# Patient Record
Sex: Female | Born: 1940
Health system: Southern US, Community
[De-identification: ages and names within clinical notes are randomized; demographics above are authoritative.]

## PROBLEM LIST (undated history)

## (undated) DIAGNOSIS — E785 Hyperlipidemia, unspecified: Secondary | ICD-10-CM

## (undated) DIAGNOSIS — C189 Malignant neoplasm of colon, unspecified: Secondary | ICD-10-CM

## (undated) DIAGNOSIS — I639 Cerebral infarction, unspecified: Secondary | ICD-10-CM

## (undated) DIAGNOSIS — Z9889 Other specified postprocedural states: Secondary | ICD-10-CM

## (undated) DIAGNOSIS — I10 Essential (primary) hypertension: Secondary | ICD-10-CM

## (undated) DIAGNOSIS — M1990 Unspecified osteoarthritis, unspecified site: Secondary | ICD-10-CM

## (undated) DIAGNOSIS — R112 Nausea with vomiting, unspecified: Secondary | ICD-10-CM

## (undated) DIAGNOSIS — H269 Unspecified cataract: Secondary | ICD-10-CM

## (undated) DIAGNOSIS — K589 Irritable bowel syndrome without diarrhea: Secondary | ICD-10-CM

## (undated) DIAGNOSIS — K219 Gastro-esophageal reflux disease without esophagitis: Secondary | ICD-10-CM

## (undated) DIAGNOSIS — I2510 Atherosclerotic heart disease of native coronary artery without angina pectoris: Secondary | ICD-10-CM

## (undated) DIAGNOSIS — N3010 Interstitial cystitis (chronic) without hematuria: Secondary | ICD-10-CM

## (undated) DIAGNOSIS — R519 Headache, unspecified: Secondary | ICD-10-CM

## (undated) DIAGNOSIS — I499 Cardiac arrhythmia, unspecified: Secondary | ICD-10-CM

## (undated) DIAGNOSIS — D649 Anemia, unspecified: Secondary | ICD-10-CM

## (undated) DIAGNOSIS — R51 Headache: Secondary | ICD-10-CM

## (undated) DIAGNOSIS — H409 Unspecified glaucoma: Secondary | ICD-10-CM

## (undated) DIAGNOSIS — M2042 Other hammer toe(s) (acquired), left foot: Secondary | ICD-10-CM

## (undated) DIAGNOSIS — R0600 Dyspnea, unspecified: Secondary | ICD-10-CM

## (undated) DIAGNOSIS — H52209 Unspecified astigmatism, unspecified eye: Secondary | ICD-10-CM

## (undated) DIAGNOSIS — B009 Herpesviral infection, unspecified: Secondary | ICD-10-CM

## (undated) DIAGNOSIS — C801 Malignant (primary) neoplasm, unspecified: Secondary | ICD-10-CM

## (undated) DIAGNOSIS — M2041 Other hammer toe(s) (acquired), right foot: Secondary | ICD-10-CM

## (undated) DIAGNOSIS — G4700 Insomnia, unspecified: Secondary | ICD-10-CM

## (undated) DIAGNOSIS — M7730 Calcaneal spur, unspecified foot: Secondary | ICD-10-CM

## (undated) DIAGNOSIS — R739 Hyperglycemia, unspecified: Secondary | ICD-10-CM

## (undated) HISTORY — DX: Cerebral infarction, unspecified: I63.9

## (undated) HISTORY — DX: Hyperlipidemia, unspecified: E78.5

## (undated) HISTORY — DX: Malignant neoplasm of colon, unspecified: C18.9

## (undated) HISTORY — DX: Herpesviral infection, unspecified: B00.9

## (undated) HISTORY — DX: Unspecified cataract: H26.9

## (undated) HISTORY — PX: CHOLECYSTECTOMY: SHX55

## (undated) HISTORY — DX: Hyperglycemia, unspecified: R73.9

## (undated) HISTORY — DX: Calcaneal spur, unspecified foot: M77.30

## (undated) HISTORY — DX: Gastro-esophageal reflux disease without esophagitis: K21.9

## (undated) HISTORY — PX: ROTATOR CUFF REPAIR: SHX139

## (undated) HISTORY — PX: ABDOMINAL HYSTERECTOMY: SHX81

## (undated) HISTORY — DX: Insomnia, unspecified: G47.00

## (undated) HISTORY — DX: Atherosclerotic heart disease of native coronary artery without angina pectoris: I25.10

## (undated) HISTORY — DX: Irritable bowel syndrome, unspecified: K58.9

## (undated) HISTORY — PX: EYE SURGERY: SHX253

## (undated) HISTORY — DX: Interstitial cystitis (chronic) without hematuria: N30.10

---

## 1997-05-28 ENCOUNTER — Encounter: Admission: RE | Admit: 1997-05-28 | Discharge: 1997-05-28 | Payer: Self-pay | Admitting: Family Medicine

## 1997-08-30 ENCOUNTER — Ambulatory Visit (HOSPITAL_COMMUNITY): Admission: RE | Admit: 1997-08-30 | Discharge: 1997-08-30 | Payer: Self-pay | Admitting: Urology

## 1998-11-05 ENCOUNTER — Ambulatory Visit (HOSPITAL_COMMUNITY): Admission: RE | Admit: 1998-11-05 | Discharge: 1998-11-05 | Payer: Self-pay | Admitting: Family Medicine

## 1999-04-15 ENCOUNTER — Encounter: Payer: Self-pay | Admitting: Orthopedic Surgery

## 1999-04-15 ENCOUNTER — Encounter: Admission: RE | Admit: 1999-04-15 | Discharge: 1999-04-15 | Payer: Self-pay | Admitting: Orthopedic Surgery

## 1999-04-27 ENCOUNTER — Observation Stay (HOSPITAL_COMMUNITY): Admission: RE | Admit: 1999-04-27 | Discharge: 1999-04-29 | Payer: Self-pay | Admitting: Orthopedic Surgery

## 2000-03-08 ENCOUNTER — Ambulatory Visit (HOSPITAL_BASED_OUTPATIENT_CLINIC_OR_DEPARTMENT_OTHER): Admission: RE | Admit: 2000-03-08 | Discharge: 2000-03-09 | Payer: Self-pay | Admitting: Orthopedic Surgery

## 2000-05-23 ENCOUNTER — Encounter: Payer: Self-pay | Admitting: Family Medicine

## 2000-05-23 LAB — CONVERTED CEMR LAB

## 2000-09-01 ENCOUNTER — Encounter: Admission: RE | Admit: 2000-09-01 | Discharge: 2000-09-01 | Payer: Self-pay | Admitting: Family Medicine

## 2000-09-01 ENCOUNTER — Encounter: Payer: Self-pay | Admitting: Family Medicine

## 2000-10-05 ENCOUNTER — Encounter: Admission: RE | Admit: 2000-10-05 | Discharge: 2000-10-05 | Payer: Self-pay | Admitting: Gastroenterology

## 2000-10-05 ENCOUNTER — Encounter: Payer: Self-pay | Admitting: Gastroenterology

## 2000-11-10 ENCOUNTER — Encounter: Payer: Self-pay | Admitting: Surgery

## 2000-11-14 ENCOUNTER — Observation Stay (HOSPITAL_COMMUNITY): Admission: RE | Admit: 2000-11-14 | Discharge: 2000-11-15 | Payer: Self-pay | Admitting: Surgery

## 2000-11-14 ENCOUNTER — Encounter (INDEPENDENT_AMBULATORY_CARE_PROVIDER_SITE_OTHER): Payer: Self-pay | Admitting: *Deleted

## 2004-09-01 ENCOUNTER — Ambulatory Visit: Payer: Self-pay | Admitting: Family Medicine

## 2005-01-01 ENCOUNTER — Ambulatory Visit: Payer: Self-pay | Admitting: Family Medicine

## 2005-03-16 ENCOUNTER — Ambulatory Visit: Payer: Self-pay | Admitting: Family Medicine

## 2005-06-14 ENCOUNTER — Ambulatory Visit: Payer: Self-pay | Admitting: Family Medicine

## 2006-03-30 ENCOUNTER — Emergency Department (HOSPITAL_COMMUNITY): Admission: EM | Admit: 2006-03-30 | Discharge: 2006-03-30 | Payer: Self-pay | Admitting: Emergency Medicine

## 2007-03-17 ENCOUNTER — Encounter: Payer: Self-pay | Admitting: Family Medicine

## 2007-03-17 DIAGNOSIS — N3010 Interstitial cystitis (chronic) without hematuria: Secondary | ICD-10-CM | POA: Insufficient documentation

## 2007-03-17 DIAGNOSIS — K589 Irritable bowel syndrome without diarrhea: Secondary | ICD-10-CM | POA: Insufficient documentation

## 2007-03-17 DIAGNOSIS — M810 Age-related osteoporosis without current pathological fracture: Secondary | ICD-10-CM | POA: Insufficient documentation

## 2007-06-27 ENCOUNTER — Telehealth: Payer: Self-pay | Admitting: Family Medicine

## 2007-06-28 ENCOUNTER — Telehealth: Payer: Self-pay | Admitting: Family Medicine

## 2007-06-28 ENCOUNTER — Ambulatory Visit: Payer: Self-pay | Admitting: Family Medicine

## 2007-06-28 DIAGNOSIS — D126 Benign neoplasm of colon, unspecified: Secondary | ICD-10-CM | POA: Insufficient documentation

## 2007-06-28 LAB — CONVERTED CEMR LAB
Bilirubin Urine: NEGATIVE
Casts: 0 /lpf
Epithelial cells, urine: 1 /lpf
Glucose, Urine, Semiquant: NEGATIVE
Ketones, urine, test strip: NEGATIVE
Mucus, UA: 0
Nitrite: NEGATIVE
Specific Gravity, Urine: 1.025
Urine crystals, microscopic: 0 /hpf
Urobilinogen, UA: 0.2
WBC Urine, dipstick: NEGATIVE
WBC, UA: 1 cells/hpf
Yeast, UA: 0
pH: 5

## 2007-06-29 ENCOUNTER — Encounter: Payer: Self-pay | Admitting: Family Medicine

## 2007-07-04 ENCOUNTER — Ambulatory Visit: Payer: Self-pay | Admitting: Professional

## 2007-07-14 ENCOUNTER — Ambulatory Visit: Payer: Self-pay | Admitting: Family Medicine

## 2007-08-30 ENCOUNTER — Ambulatory Visit: Payer: Self-pay | Admitting: Family Medicine

## 2007-08-30 LAB — CONVERTED CEMR LAB
Bacteria, UA: 0
Bilirubin Urine: NEGATIVE
Blood in Urine, dipstick: NEGATIVE
Casts: 0 /lpf
Glucose, Urine, Semiquant: NEGATIVE
Ketones, urine, test strip: NEGATIVE
Nitrite: NEGATIVE
Protein, U semiquant: NEGATIVE
RBC / HPF: 0
Specific Gravity, Urine: 1.02
Urine crystals, microscopic: 0 /hpf
Urobilinogen, UA: 0.2
WBC Urine, dipstick: NEGATIVE
WBC, UA: 0 cells/hpf
Yeast, UA: 0
pH: 6

## 2007-08-31 LAB — CONVERTED CEMR LAB
ALT: 22 units/L (ref 0–35)
AST: 22 units/L (ref 0–37)
Albumin: 3.8 g/dL (ref 3.5–5.2)
Alkaline Phosphatase: 69 units/L (ref 39–117)
BUN: 19 mg/dL (ref 6–23)
Basophils Absolute: 0 10*3/uL (ref 0.0–0.1)
Basophils Relative: 0.5 % (ref 0.0–1.0)
Bilirubin, Direct: 0.1 mg/dL (ref 0.0–0.3)
CO2: 29 meq/L (ref 19–32)
Calcium: 9.2 mg/dL (ref 8.4–10.5)
Chloride: 105 meq/L (ref 96–112)
Cholesterol: 213 mg/dL (ref 0–200)
Creatinine, Ser: 1 mg/dL (ref 0.4–1.2)
Direct LDL: 129.3 mg/dL
Eosinophils Absolute: 0.1 10*3/uL (ref 0.0–0.7)
Eosinophils Relative: 1 % (ref 0.0–5.0)
GFR calc Af Amer: 71 mL/min
GFR calc non Af Amer: 58 mL/min
Glucose, Bld: 112 mg/dL — ABNORMAL HIGH (ref 70–99)
HCT: 37.5 % (ref 36.0–46.0)
HDL: 64.3 mg/dL (ref >39.0)
Hemoglobin: 12.9 g/dL (ref 12.0–15.0)
Lymphocytes Relative: 29.4 % (ref 12.0–46.0)
MCHC: 34.3 g/dL (ref 30.0–36.0)
MCV: 90.6 fL (ref 78.0–100.0)
Monocytes Absolute: 0.5 10*3/uL (ref 0.1–1.0)
Monocytes Relative: 8.1 % (ref 3.0–12.0)
Neutro Abs: 4.1 10*3/uL (ref 1.4–7.7)
Neutrophils Relative %: 61 % (ref 43.0–77.0)
Platelets: 260 10*3/uL (ref 150–400)
Potassium: 4 meq/L (ref 3.5–5.1)
RBC: 4.14 M/uL (ref 3.87–5.11)
RDW: 13.1 % (ref 11.5–14.6)
Sodium: 140 meq/L (ref 135–145)
TSH: 1.96 microintl units/mL (ref 0.35–5.50)
Total Bilirubin: 0.7 mg/dL (ref 0.3–1.2)
Total CHOL/HDL Ratio: 3.3
Total Protein: 6.9 g/dL (ref 6.0–8.3)
Triglycerides: 76 mg/dL (ref 0–149)
VLDL: 15 mg/dL (ref 0–40)
WBC: 6.6 10*3/uL (ref 4.5–10.5)

## 2007-09-01 LAB — CONVERTED CEMR LAB: Vit D, 1,25-Dihydroxy: 16 — ABNORMAL LOW (ref 30–89)

## 2007-11-13 ENCOUNTER — Ambulatory Visit: Payer: Self-pay | Admitting: Family Medicine

## 2007-11-15 LAB — CONVERTED CEMR LAB
ALT: 17 units/L (ref 0–35)
AST: 20 units/L (ref 0–37)
Cholesterol: 184 mg/dL (ref 0–200)
HDL: 67.7 mg/dL (ref >39.0)
Hgb A1c MFr Bld: 5.9 % (ref 4.6–6.0)
LDL Cholesterol: 104 mg/dL — ABNORMAL HIGH (ref 0–99)
Total CHOL/HDL Ratio: 2.7
Triglycerides: 61 mg/dL (ref 0–149)
VLDL: 12 mg/dL (ref 0–40)
Vit D, 1,25-Dihydroxy: 26 — ABNORMAL LOW (ref 30–89)

## 2008-03-18 ENCOUNTER — Telehealth: Payer: Self-pay | Admitting: Family Medicine

## 2008-03-18 ENCOUNTER — Ambulatory Visit: Payer: Self-pay | Admitting: Family Medicine

## 2008-03-18 LAB — CONVERTED CEMR LAB
Bilirubin Urine: NEGATIVE
Casts: 0 /lpf
Glucose, Urine, Semiquant: NEGATIVE
Ketones, urine, test strip: NEGATIVE
Nitrite: NEGATIVE
Protein, U semiquant: NEGATIVE
Specific Gravity, Urine: 1.02
Urine crystals, microscopic: 0 /hpf
Urobilinogen, UA: 0.2
Yeast, UA: 1
pH: 6

## 2008-03-19 ENCOUNTER — Ambulatory Visit: Payer: Self-pay | Admitting: Family Medicine

## 2008-03-20 ENCOUNTER — Encounter: Payer: Self-pay | Admitting: Family Medicine

## 2008-03-27 ENCOUNTER — Telehealth: Payer: Self-pay | Admitting: Family Medicine

## 2008-03-28 ENCOUNTER — Ambulatory Visit: Payer: Self-pay | Admitting: Family Medicine

## 2008-03-28 LAB — CONVERTED CEMR LAB
Casts: 0 /lpf
RBC / HPF: 0
WBC, UA: 1 cells/hpf
Yeast, UA: 0

## 2008-03-29 ENCOUNTER — Encounter: Payer: Self-pay | Admitting: Family Medicine

## 2008-04-30 ENCOUNTER — Ambulatory Visit: Payer: Self-pay | Admitting: Family Medicine

## 2008-04-30 DIAGNOSIS — E559 Vitamin D deficiency, unspecified: Secondary | ICD-10-CM | POA: Insufficient documentation

## 2008-05-03 LAB — CONVERTED CEMR LAB
Albumin: 3.6 g/dL (ref 3.5–5.2)
BUN: 15 mg/dL (ref 6–23)
Basophils Absolute: 0 10*3/uL (ref 0.0–0.1)
Basophils Relative: 0.7 % (ref 0.0–3.0)
CO2: 29 meq/L (ref 19–32)
Calcium: 9.1 mg/dL (ref 8.4–10.5)
Chloride: 105 meq/L (ref 96–112)
Creatinine, Ser: 0.8 mg/dL (ref 0.4–1.2)
Eosinophils Absolute: 0.1 10*3/uL (ref 0.0–0.7)
Eosinophils Relative: 1.4 % (ref 0.0–5.0)
GFR calc Af Amer: 91 mL/min
GFR calc non Af Amer: 76 mL/min
Glucose, Bld: 79 mg/dL (ref 70–99)
HCT: 36.9 % (ref 36.0–46.0)
Hemoglobin: 12.5 g/dL (ref 12.0–15.0)
Lymphocytes Relative: 32.2 % (ref 12.0–46.0)
MCHC: 33.9 g/dL (ref 30.0–36.0)
MCV: 90.8 fL (ref 78.0–100.0)
Monocytes Absolute: 0.6 10*3/uL (ref 0.1–1.0)
Monocytes Relative: 10.6 % (ref 3.0–12.0)
Neutro Abs: 3.2 10*3/uL (ref 1.4–7.7)
Neutrophils Relative %: 55.1 % (ref 43.0–77.0)
Phosphorus: 3 mg/dL (ref 2.3–4.6)
Platelets: 268 10*3/uL (ref 150–400)
Potassium: 3.8 meq/L (ref 3.5–5.1)
RBC: 4.07 M/uL (ref 3.87–5.11)
RDW: 12.6 % (ref 11.5–14.6)
Sodium: 140 meq/L (ref 135–145)
TSH: 1.69 microintl units/mL (ref 0.35–5.50)
Vit D, 25-Hydroxy: 23 ng/mL — ABNORMAL LOW (ref 30–89)
WBC: 5.7 10*3/uL (ref 4.5–10.5)

## 2008-05-10 ENCOUNTER — Encounter: Admission: RE | Admit: 2008-05-10 | Discharge: 2008-05-10 | Payer: Self-pay | Admitting: Family Medicine

## 2008-05-15 ENCOUNTER — Encounter (INDEPENDENT_AMBULATORY_CARE_PROVIDER_SITE_OTHER): Payer: Self-pay | Admitting: *Deleted

## 2008-07-12 ENCOUNTER — Ambulatory Visit: Payer: Self-pay | Admitting: Family Medicine

## 2008-07-16 ENCOUNTER — Ambulatory Visit: Payer: Self-pay | Admitting: Family Medicine

## 2008-07-16 LAB — CONVERTED CEMR LAB
Casts: 0 /lpf
Glucose, Urine, Semiquant: NEGATIVE
Urine crystals, microscopic: 0 /hpf
Vit D, 25-Hydroxy: 21 ng/mL — ABNORMAL LOW (ref 30–89)
Yeast, UA: 0

## 2008-07-17 ENCOUNTER — Encounter: Payer: Self-pay | Admitting: Family Medicine

## 2008-07-19 ENCOUNTER — Telehealth: Payer: Self-pay | Admitting: Family Medicine

## 2008-09-13 ENCOUNTER — Ambulatory Visit: Payer: Self-pay | Admitting: Family Medicine

## 2008-09-13 LAB — CONVERTED CEMR LAB
Bacteria, UA: 0
Bilirubin Urine: NEGATIVE
Blood in Urine, dipstick: NEGATIVE
Casts: 0 /lpf
Glucose, Urine, Semiquant: NEGATIVE
Ketones, urine, test strip: NEGATIVE
Nitrite: NEGATIVE
Protein, U semiquant: NEGATIVE
RBC / HPF: 0
Specific Gravity, Urine: 1.01
Urine crystals, microscopic: 0 /hpf
Urobilinogen, UA: 0.2
WBC Urine, dipstick: NEGATIVE
WBC, UA: 0 cells/hpf
Yeast, UA: 0
pH: 7

## 2008-09-16 ENCOUNTER — Telehealth: Payer: Self-pay | Admitting: Family Medicine

## 2008-10-08 ENCOUNTER — Ambulatory Visit: Payer: Self-pay | Admitting: Family Medicine

## 2008-10-14 LAB — CONVERTED CEMR LAB: Vit D, 25-Hydroxy: 22 ng/mL — ABNORMAL LOW (ref 30–89)

## 2008-12-04 ENCOUNTER — Ambulatory Visit: Payer: Self-pay | Admitting: Family Medicine

## 2008-12-04 DIAGNOSIS — J309 Allergic rhinitis, unspecified: Secondary | ICD-10-CM | POA: Insufficient documentation

## 2008-12-04 LAB — CONVERTED CEMR LAB
Bacteria, UA: 0
Bilirubin Urine: NEGATIVE
Epithelial cells, urine: 0 /lpf
Glucose, Urine, Semiquant: NEGATIVE
Ketones, urine, test strip: NEGATIVE
Nitrite: NEGATIVE
Specific Gravity, Urine: <1.005
Urobilinogen, UA: 0.2
WBC Urine, dipstick: NEGATIVE
WBC, UA: 0 cells/hpf
pH: 6

## 2008-12-06 ENCOUNTER — Telehealth (INDEPENDENT_AMBULATORY_CARE_PROVIDER_SITE_OTHER): Payer: Self-pay | Admitting: Internal Medicine

## 2009-06-11 ENCOUNTER — Ambulatory Visit: Payer: Self-pay | Admitting: Family Medicine

## 2009-07-29 ENCOUNTER — Telehealth (INDEPENDENT_AMBULATORY_CARE_PROVIDER_SITE_OTHER): Payer: Self-pay | Admitting: *Deleted

## 2009-07-29 ENCOUNTER — Ambulatory Visit: Payer: Self-pay | Admitting: Family Medicine

## 2009-07-29 DIAGNOSIS — R7309 Other abnormal glucose: Secondary | ICD-10-CM

## 2009-07-29 DIAGNOSIS — R7303 Prediabetes: Secondary | ICD-10-CM | POA: Insufficient documentation

## 2009-07-29 LAB — CONVERTED CEMR LAB
ALT: 17 units/L (ref 0–35)
AST: 19 units/L (ref 0–37)
Albumin: 4.2 g/dL (ref 3.5–5.2)
BUN: 14 mg/dL (ref 6–23)
CO2: 30 meq/L (ref 19–32)
Calcium: 9.8 mg/dL (ref 8.4–10.5)
Chloride: 106 meq/L (ref 96–112)
Cholesterol: 247 mg/dL — ABNORMAL HIGH (ref 0–200)
Creatinine, Ser: 0.9 mg/dL (ref 0.4–1.2)
Direct LDL: 144.5 mg/dL
GFR calc non Af Amer: 70.07 mL/min (ref >60)
Glucose, Bld: 88 mg/dL (ref 70–99)
HDL: 85.6 mg/dL (ref >39.00)
Hgb A1c MFr Bld: 6 % (ref 4.6–6.5)
Phosphorus: 3.9 mg/dL (ref 2.3–4.6)
Potassium: 4.4 meq/L (ref 3.5–5.1)
Sodium: 143 meq/L (ref 135–145)
TSH: 1.65 microintl units/mL (ref 0.35–5.50)
Total CHOL/HDL Ratio: 3
Triglycerides: 69 mg/dL (ref 0.0–149.0)
VLDL: 13.8 mg/dL (ref 0.0–40.0)

## 2009-07-30 LAB — CONVERTED CEMR LAB: Vit D, 25-Hydroxy: 28 ng/mL — ABNORMAL LOW (ref 30–89)

## 2009-08-01 ENCOUNTER — Ambulatory Visit: Payer: Self-pay | Admitting: Family Medicine

## 2009-08-01 LAB — CONVERTED CEMR LAB
Bacteria, UA: 0
Bilirubin Urine: NEGATIVE
Casts: 0 /lpf
Epithelial cells, urine: 1 /lpf
Glucose, Urine, Semiquant: NEGATIVE
Ketones, urine, test strip: NEGATIVE
Nitrite: NEGATIVE
RBC / HPF: 0
Specific Gravity, Urine: 1.015
Urine crystals, microscopic: 0 /hpf
Urobilinogen, UA: 0.2
WBC Urine, dipstick: NEGATIVE
WBC, UA: 0 cells/hpf
Yeast, UA: 0
pH: 7

## 2009-08-15 ENCOUNTER — Encounter: Admission: RE | Admit: 2009-08-15 | Discharge: 2009-08-15 | Payer: Self-pay | Admitting: Family Medicine

## 2009-08-15 LAB — HM MAMMOGRAPHY: HM Mammogram: NEGATIVE

## 2009-08-18 ENCOUNTER — Encounter (INDEPENDENT_AMBULATORY_CARE_PROVIDER_SITE_OTHER): Payer: Self-pay | Admitting: *Deleted

## 2009-09-24 ENCOUNTER — Encounter: Payer: Self-pay | Admitting: Family Medicine

## 2009-09-24 LAB — HM COLONOSCOPY: HM Colonoscopy: NORMAL

## 2010-01-09 ENCOUNTER — Ambulatory Visit: Payer: Self-pay | Admitting: Family Medicine

## 2010-01-09 DIAGNOSIS — R252 Cramp and spasm: Secondary | ICD-10-CM | POA: Insufficient documentation

## 2010-01-09 DIAGNOSIS — M79609 Pain in unspecified limb: Secondary | ICD-10-CM | POA: Insufficient documentation

## 2010-01-13 ENCOUNTER — Telehealth: Payer: Self-pay | Admitting: Family Medicine

## 2010-01-13 LAB — CONVERTED CEMR LAB
BUN: 16 mg/dL (ref 6–23)
CO2: 29 meq/L (ref 19–32)
Calcium: 9.2 mg/dL (ref 8.4–10.5)
Chloride: 104 meq/L (ref 96–112)
Creatinine, Ser: 0.8 mg/dL (ref 0.4–1.2)
GFR calc non Af Amer: 72.94 mL/min (ref >60)
Glucose, Bld: 92 mg/dL (ref 70–99)
Hgb A1c MFr Bld: 6.1 % (ref 4.6–6.5)
Potassium: 4.3 meq/L (ref 3.5–5.1)
Sodium: 140 meq/L (ref 135–145)
TSH: 2.04 microintl units/mL (ref 0.35–5.50)

## 2010-01-28 ENCOUNTER — Ambulatory Visit: Payer: Self-pay | Admitting: Family Medicine

## 2010-01-29 ENCOUNTER — Encounter (INDEPENDENT_AMBULATORY_CARE_PROVIDER_SITE_OTHER): Payer: Self-pay | Admitting: *Deleted

## 2010-02-02 ENCOUNTER — Encounter: Payer: Self-pay | Admitting: Family Medicine

## 2010-02-02 ENCOUNTER — Ambulatory Visit: Payer: Self-pay | Admitting: Family Medicine

## 2010-02-02 DIAGNOSIS — G4700 Insomnia, unspecified: Secondary | ICD-10-CM | POA: Insufficient documentation

## 2010-02-02 LAB — CONVERTED CEMR LAB
Cholesterol, target level: 200 mg/dL
HDL goal, serum: 40 mg/dL
LDL Goal: 160 mg/dL

## 2010-02-06 LAB — CONVERTED CEMR LAB: Vit D, 25-Hydroxy: 24 ng/mL — ABNORMAL LOW (ref 30–89)

## 2010-02-11 LAB — CONVERTED CEMR LAB
ALT: 19 units/L (ref 0–35)
AST: 21 units/L (ref 0–37)
Cholesterol: 228 mg/dL — ABNORMAL HIGH (ref 0–200)
Direct LDL: 132.1 mg/dL
HDL: 76.2 mg/dL (ref >39.00)
Total CHOL/HDL Ratio: 3
Triglycerides: 65 mg/dL (ref 0.0–149.0)
VLDL: 13 mg/dL (ref 0.0–40.0)

## 2010-03-15 ENCOUNTER — Encounter: Payer: Self-pay | Admitting: Family Medicine

## 2010-03-26 NOTE — Assessment & Plan Note (Signed)
Summary: 2 MONTH FOLLOW UP/RBH   Vital Signs:  Patient Profile:   70 Years Old Female Weight:      153 pounds Temp:     97.6 degrees F oral Pulse rate:   60 / minute Pulse rhythm:   regular BP sitting:   120 / 60  (left arm) Cuff size:   regular  Vitals Entered By: Providence Crosby (August 30, 2007 11:32 AM)                 Chief Complaint:  2 MONTH FOLLOWUP.  History of Present Illness: is doing well overall  some leg aching both joint and muscle at night with more outdoor work-- but otherwise ok  has had OP in past- no meds, just ca and vit D good exercise- rides horses   her reflux symptoms are better-- both throat and chest symptoms are gone  on nexium daily-- occ misses a dose  whole body and even bladder feels better on nexium -- IC  pt notes-- both of her probems with reflux and swallowing   left urine sample today hard to tell when she has an infection- because urine is always frequent   wants to start working on wt loss - knows shs needs to eat less wants to loose fat around middle  wants to be checked for DM- and have general labs  no thirst or inc urination or numbness diet is overall fair at this tims-- some sweets      Prior Medications Reviewed Using: Patient Recall  Current Allergies (reviewed today): ! PENICILLIN ! SULFA ! CODEINE ! DEMEROL ! DARVON  Past Medical History:    Reviewed history from 06/28/2007 and no changes required:       Hyperlipidemia       Osteoporosis       IC       heel spurs       IBS OP       GERD         Past Surgical History:    Reviewed history from 03/17/2007 and no changes required:       Right arm fracture       Rotator cuff repair x 2       Hysterectomy- PID, 1 ovary left       Dexa- osteopenia (08/2000)       Upper GI- hiatal hernia (09/2000)       Cholecystectomy (11/2000)       Cardiolite- neg (12/2001)       Colonoscopy- polyps (04/2002)   Family History:    Reviewed history from 03/17/2007 and  no changes required:       Father: MI's, embolism, ? aneurysm, TB, CVA ETOH, HTN- died age 90, GERD       Mother: ETOH, MI's- died age 59, GERD       Siblings: 1 sister with female cancer, 1 sister- suicide, depressed and in bad marriage  Social History:    Reviewed history from 06/28/2007 and no changes required:       Marital Status: Married (husb had aneurysm- survived )       Children: 4       Occupation: retired       rides horses       non smoker           Review of Systems  General      Denies fatigue, loss of appetite, and malaise.  Eyes      Denies  blurring and eye pain.  GI      Denies abdominal pain, bloody stools, change in bowel habits, dark tarry stools, gas, loss of appetite, nausea, and vomiting.  GU      Complains of urinary frequency.      Denies dysuria, hematuria, and incontinence.  Derm      Denies itching, lesion(s), and rash.  Neuro      Denies numbness and tingling.  Psych      mood is ok- stress with husb is a bit better  Endo      Denies excessive thirst and excessive urination.   Physical Exam  General:     Well-developed,well-nourished,in no acute distress; alert,appropriate and cooperative throughout examination Head:     normocephalic, atraumatic, and no abnormalities observed.   Eyes:     vision grossly intact, pupils equal, pupils round, and pupils reactive to light.  no conjunctival pallor, injection or icterus  Mouth:     MMM, throat clear  Neck:     supple with full rom and no masses or thyromegally, no JVD or carotid bruit  Chest Wall:     No deformities, masses, or tenderness noted. Lungs:     Normal respiratory effort, chest expands symmetrically. Lungs are clear to auscultation, no crackles or wheezes. Heart:     Normal rate and regular rhythm. S1 and S2 normal without gallop, murmur, click, rub or other extra sounds. Abdomen:     Bowel sounds positive,abdomen soft and non-tender without masses, organomegaly or  hernias noted. Msk:     no CVA tenderness no acute joint changes  no kyphosis  Extremities:     No clubbing, cyanosis, edema, or deformity noted with normal full range of motion of all joints.   Neurologic:     sensation intact to light touch, gait normal, and DTRs symmetrical and normal.   Skin:     Intact without suspicious lesions or rashes slt sallow complexion baseline  Cervical Nodes:     No lymphadenopathy noted Inguinal Nodes:     No significant adenopathy Psych:     normal affect, talkative and pleasant     Impression & Recommendations:  Problem # 1:  GERD (ICD-530.81) Assessment: Improved resolution of symptoms on nexium plan to continue it with good diet and effort for wt loss will check cbc with diff with labs today Her updated medication list for this problem includes:    Nexium 40 Mg Cpdr (Esomeprazole magnesium) .Marland Kitchen... 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) Assessment: Unchanged ongoing with symptoms that come and go  some increased frequency past 2 d-- specimen left is negative for abnormalities  Orders: UA Dipstick W/ Micro (manual) (60454)  Orders: UA Dipstick W/ Micro (manual) (09811)   Problem # 3:  HYPERLIPIDEMIA (ICD-272.4) Assessment: Unchanged checking labs today -for lipids and liver fxn and bmp  diet is mostly low fat- except for ice cream disc low sat fat diet  schedule f/u in fall to disc results in more detail Orders: Venipuncture (91478) TLB-Lipid Panel (80061-LIPID) TLB-BMP (Basic Metabolic Panel-BMET) (80048-METABOL) TLB-Hepatic/Liver Function Pnl (80076-HEPATIC)   Problem # 4:  OSTEOPOROSIS (ICD-733.00) Assessment: Unchanged enc to continue ca and vit D will check tsh with labs as well as vit D level will disc further at f/u in fall  Her updated medication list for this problem includes:    Calcium 1500 Mg  Tabs (  Calcium carbonate) .Marland Kitchen... Take by mouth as directed qd  Orders: Venipuncture (16109) TLB-TSH (Thyroid Stimulating Hormone) (84443-TSH) T-Vitamin D (25-Hydroxy) (60454-09811)   Problem # 5:  SCREENING, DIABETES MELLITUS (ICD-V77.1) Assessment: Comment Only sugar incl in labs at pt request- screen for DM  disc further at f/u keep working at healthy diet-- adv to elim sweets   Complete Medication List: 1)  Nexium 40 Mg Cpdr (Esomeprazole magnesium) .Marland Kitchen.. 1 by mouth once daily in am 2)  Adult Aspirin Low Strength 81 Mg Tbdp (Aspirin) .... One by mouth daily 3)  Calcium 1500 Mg Tabs (Calcium carbonate) .... Take by mouth as directed qd   Patient Instructions: 1)  we are doing some labs today- cholesterol and sugar (maintenence) 2)  schedule follow up for 30 min check up in the fall  3)  try to avoid sweets and keep up good exercise 4)  continue nexium every day    ] Laboratory Results   Urine Tests  Date/Time Recieved: August 30, 2007 12:17 PM Date/Time Reported: August 30, 2007 12:17 PM  Routine Urinalysis   Color: yellow Appearance: Clear Glucose: negative   (Normal Range: Negative) Bilirubin: negative   (Normal Range: Negative) Ketone: negative   (Normal Range: Negative) Spec. Gravity: 1.020   (Normal Range: 1.003-1.035) Blood: negative   (Normal Range: Negative) pH: 6.0   (Normal Range: 5.0-8.0) Protein: negative   (Normal Range: Negative) Urobilinogen: 0.2   (Normal Range: 0-1) Nitrite: negative   (Normal Range: Negative) Leukocyte Esterace: negative   (Normal Range: Negative)  Urine Microscopic WBC/hpf: 0 RBC/hpf: 0 Bacteria: 0 Mucous: few Epithelial: 1-2 Crystals/LPF: 0 Casts/LPF: 0 Yeast/HPF: 0

## 2010-03-26 NOTE — Progress Notes (Signed)
Summary: UTI sxs coming back  Phone Note Call from Patient Call back at Home Phone [phone]   Caller: Patient Call For: Judith Part MD Summary of Call: Pt. states she again has UTI sxs-  burning, frequent urination- worse at night. No fever or other sxs.  She is asking for a refill on levaquin or does she need to be seen.  [facility]. Initial call taken by: Lowella Petties,  March 27, 2008 3:21 PM  Follow-up for Phone Call        needs to drop off urine for UA and culture - will adv after looking at ua Follow-up by: Judith Part MD,  March 27, 2008 5:28 PM  Additional Follow-up for Phone Call Additional follow up Details #1::        Advised patient.  ......................................................Marland KitchenLiane Comber March 27, 2008 5:31 PM

## 2010-03-26 NOTE — Progress Notes (Signed)
----   Converted from flag ---- ---- 07/29/2009 8:29 AM, Colon Flattery Tower MD wrote: please check AIC, lipid /ast/alt/ renal / tsh/ vit D level for 272, 250.0 and vit D def - thanks  ---- 07/28/2009 11:57 AM, Mills Koller wrote: cpx labs for tomorrow, please ------------------------------

## 2010-03-26 NOTE — Assessment & Plan Note (Signed)
Summary: CONGESTION,HA/CLE   Vital Signs:  Patient profile:   70 year old female Height:      64 inches Weight:      156.25 pounds BMI:     26.92 Temp:     98.3 degrees F oral Pulse rate:   72 / minute Pulse rhythm:   regular BP sitting:   130 / 76  (left arm) Cuff size:   regular  Vitals Entered By: Lewanda Rife LPN (December 04, 2008 12:13 PM)  CC:  HEAD CONGESTED and H/A URGENCY OF URINE.  History of Present Illness: Here for URI signs--onset x 3 wks ago with GI signs --resolved --now with nasal congestion, post nasal drip, some cough --taking dayquil and nyquil,      Here for urgency--onset x today --has hx of UTIs  Allergies: 1)  ! Penicillin 2)  ! Sulfa 3)  ! Codeine 4)  ! Demerol 5)  ! Darvon  Past History:  Past Medical History: Reviewed history from 04/30/2008 and no changes required. Hyperlipidemia Osteoporosis (could not remb to take actonel in past ) IC heel spurs IBS GERD  [facility]-- [facility] - [place] [facility]- [facility]  ENT- [facility]  Review of Systems      See HPI  Physical Exam  General:  alert, well-developed, well-nourished, and well-hydrated.  NAD Eyes:  pupils equal, pupils round, and no injection.   Ears:  TMs retracted with increased fluid Nose:  mucosal edema/boggy, sinuses neg, .  no airflow obstruction and mucosal edema.   Mouth:  no exudates and pharyngeal erythema.   Lungs:  normal respiratory effort, no intercostal retractions, no accessory muscle use, normal breath sounds, no crackles, and no wheezes.   Abdomen:  no CVA or suprapubic tenderness Cervical Nodes:  no anterior cervical adenopathy and no posterior cervical adenopathy.   Psych:  normally interactive, flat affect, and subdued.     Impression & Recommendations:  Problem # 1:  ALLERGIC RHINITIS (ICD-477.9) Assessment New will try on clarinex rather than current OTCs see back or call if not improved in 5-7d--ABT if increased  sx Her updated medication list for this problem includes:    Clarinex 5 Mg Tabs (Desloratadine) .Marland Kitchen... Take 1 once daily for nasal congestion  Problem # 2:  URINARY FREQUENCY (ICD-788.41) Assessment: New U/A neg--see back if worsens Orders: UA Dipstick W/ Micro (manual) (04540)  Complete Medication List: 1)  Adult Aspirin Low Strength 81 Mg Tbdp (Aspirin) .... One by mouth daily 2)  Glucosamine-chondroitin Caps (Glucosamine-chondroit-vit c-mn) .... Take 1 tablet by mouth once a day as needed 3)  Fish Oil Oil (Fish oil) .... Take 1 tablet by mouth once a day as needed 4)  Mobic 15 Mg Tabs (Meloxicam) .Marland Kitchen.. 1 by mouth once daily as needed pain 5)  Flexeril 10 Mg Tabs (Cyclobenzaprine hcl) .... 1/2 to 1 by mouth at bedtime as needed back pain (caution- will sedate) 6)  Vitamin D 2000 Unit Tabs (Cholecalciferol) .... Take 1 tablet by mouth once a day 7)  Vitamin D (ergocalciferol) 50000 Unit Caps (Ergocalciferol) .... Take 1 tablet by mouth once a week 8)  Nyquil 60-7.07-21-998 Mg/42ml Liqd (Pseudoeph-doxylamine-dm-apap) .... Otc as directed. 9)  Dayquil Multi-symptom 30-325-10 Mg/14ml Liqd (Pseudoephedrine-apap-dm) .... Otc as directed. 10)  Clarinex 5 Mg Tabs (Desloratadine) .... Take 1 once daily for nasal congestion Prescriptions: CLARINEX 5 MG TABS (DESLORATADINE) take 1 once daily for nasal congestion Brand medically necessary #5 x 0   Entered and Authorized by:  Billie-Lynn Tyler Deis FNP   Signed by:   Gildardo Griffes FNP on 12/04/2008   Method used:   [facility] [facility]retail)       [address]       [place], [place]  [address]       Ph: [phone] or [phone]       Fax: [phone]   RxID:   [id number]   Current Allergies (reviewed today): ! PENICILLIN ! SULFA ! CODEINE ! DEMEROL ! DARVON  Laboratory Results   Urine Tests  Date/Time Received: December 04, 2008 12:16 PM  Date/Time Reported: December 04, 2008  12:16 PM   Routine Urinalysis   Color: yellow Appearance: Clear Glucose: negative   (Normal Range: Negative) Bilirubin: negative   (Normal Range: Negative) Ketone: negative   (Normal Range: Negative) Spec. Gravity: <1.005   (Normal Range: 1.003-1.035) Blood: trace-lysed   (Normal Range: Negative) pH: 6.0   (Normal Range: 5.0-8.0) Protein: trace   (Normal Range: Negative) Urobilinogen: 0.2   (Normal Range: 0-1) Nitrite: negative   (Normal Range: Negative) Leukocyte Esterace: negative   (Normal Range: Negative)  Urine Microscopic WBC/HPF: 0 RBC/HPF: 0-1 Bacteria/HPF: 0 Epithelial/HPF: 0        Laboratory Results   Urine Tests    Routine Urinalysis   Color: yellow Appearance: Clear Glucose: negative   (Normal Range: Negative) Bilirubin: negative   (Normal Range: Negative) Ketone: negative   (Normal Range: Negative) Spec. Gravity: <1.005   (Normal Range: 1.003-1.035) Blood: trace-lysed   (Normal Range: Negative) pH: 6.0   (Normal Range: 5.0-8.0) Protein: trace   (Normal Range: Negative) Urobilinogen: 0.2   (Normal Range: 0-1) Nitrite: negative   (Normal Range: Negative) Leukocyte Esterace: negative   (Normal Range: Negative)  Urine Microscopic WBC/hpf: 0 RBC/hpf: 0-1 Bacteria: 0 Epithelial: 0

## 2010-03-26 NOTE — Letter (Signed)
Summary: [name]  [place] at [facility]  [address] [place], [place] [address]   Phone: [phone]  Fax: [phone]    01/29/2010 MRN: [id number]  [name] [address] [place], [place]  [address]   Dear [name],   Our records indicate that you missed your scheduled appointment with ___lab__________________ on 12.7.2011____________.  Please contact this office to reschedule your appointment as soon as possible.  It is important that you keep your scheduled appointments with your physician, so we can provide you the best care possible.  Please be advised that there may be a charge for "no show" appointments.    Sincerely,   [place] at [facility]

## 2010-03-26 NOTE — Assessment & Plan Note (Signed)
Summary: urine specimen/tsc  Nurse Visit    Prior Medications: NEXIUM 40 MG CPDR (ESOMEPRAZOLE MAGNESIUM) 1 by mouth once daily in am ADULT ASPIRIN LOW STRENGTH 81 MG  TBDP (ASPIRIN) one by mouth daily CALCIUM 1500 MG  TABS (CALCIUM CARBONATE) take by mouth as directed qd Current Allergies: ! PENICILLIN ! SULFA ! CODEINE ! DEMEROL ! DARVON Laboratory Results   Urine Tests  Date/Time Received: March 18, 2008 3:52 PM Date/Time Reported: March 18, 2008 3:52 PM  Routine Urinalysis   Color: yellow Appearance: Hazy Glucose: negative   (Normal Range: Negative) Bilirubin: negative   (Normal Range: Negative) Ketone: negative   (Normal Range: Negative) Spec. Gravity: 1.020   (Normal Range: 1.003-1.035) Blood: large   (Normal Range: Negative) pH: 6.0   (Normal Range: 5.0-8.0) Protein: negative   (Normal Range: Negative) Urobilinogen: 0.2   (Normal Range: 0-1) Nitrite: negative   (Normal Range: Negative) Leukocyte Esterace: small   (Normal Range: Negative)  Urine Microscopic WBC/HPF: 2-3 RBC/HPF: 5-10 Bacteria/HPF: few Mucous/HPF: few Epithelial/HPF: 1-3 Crystals/HPF: 0 Casts/LPF: 0 Yeast/HPF: 1 Other: 0    Comments: urine does look positive  please call in levaquin 250 mg 1 by mouth once daily for 5 days #5 no ref  send urine for culture  update if no imp  I called pt, did not get an answer. .............................................Marland KitchenLiane Comber March 18, 2008 5:08 PM pt not avail Liane Comber  March 19, 2008 8:34 AM   Advised patient.  ......................................................Marland KitchenNatasha Genola Yuille March 19, 2008 9:22 AM Rx done, was sent electronically. ....................................................Marland KitchenNatasha Devine Klingel March 19, 2008 9:22 AM        Orders Added: 1)  T-Culture, Urine [[id number]] 2)  UA Dipstick W/ Micro (manual) [81000]    Prescriptions: LEVAQUIN 250 MG TABS (LEVOFLOXACIN) Take 1 tablet by mouth once a  day for 5 days  #5 x 0   Entered by:   Liane Comber   Authorized by:   Judith Part MD   Signed by:   Liane Comber on 03/19/2008   Method used:   [facility]        [facility] [facility]       [address]       [place], [place]  [address]       Ph: [phone] or [phone]       Fax: [phone]   RxID:   [id number]  ]

## 2010-03-26 NOTE — Letter (Signed)
Summary: Generic Letter  [place] at [facility]  [address] [place], [place] [address]   Phone: [phone]  Fax: [phone]    05/15/2008     [name] [address] [place], [place]  [address]     Dear [name],   Your mammogram was normal, please repeat screening in one year.   Sincerely,   [place] at [facility]

## 2010-03-26 NOTE — Assessment & Plan Note (Signed)
Summary: ?HIVES ON FACE/CLE   Vital Signs:  Patient Profile:   70 Years Old Female Weight:      154 pounds Temp:     97.9 degrees F oral Pulse rate:   80 / minute Pulse rhythm:   regular BP sitting:   130 / 60  (left arm) Cuff size:   regular  Vitals Entered By: Lowella Petties (Jul 14, 2007 11:14 AM)                 Chief Complaint:  Itchy rash on face.  History of Present Illness: used a [employer] 24D--may have had some on her hands and touched face  also ate stawberries  started breaking out on her face- started out between eyes (put alcohol on it)-- last week has not been in poison ivy- is outside a lot,though is itchy- tries not to scratch used husband's ointment--?for itch      Current Allergies: ! PENICILLIN ! SULFA ! CODEINE ! DEMEROL ! DARVON  Past Medical History:    Reviewed history from 06/28/2007 and no changes required:       Hyperlipidemia       Osteoporosis       IC       heel spurs       IBS OP         Past Surgical History:    Reviewed history from 03/17/2007 and no changes required:       Right arm fracture       Rotator cuff repair x 2       Hysterectomy- PID, 1 ovary left       Dexa- osteopenia (08/2000)       Upper GI- hiatal hernia (09/2000)       Cholecystectomy (11/2000)       Cardiolite- neg (12/2001)       Colonoscopy- polyps (04/2002)   Family History:    Reviewed history from 03/17/2007 and no changes required:       Father: MI's, embolism, ? aneurysm, TB, CVA ETOH, HTN- died age 61       Mother: ETOH, MI's- died age 9       Siblings: 1 sister with female cancer, 1 sister- suicide, depressed and in bad marriage  Social History:    Reviewed history from 06/28/2007 and no changes required:       Marital Status: Married       Children: 4       Occupation: retired       rides horses       non smoker           Review of Systems  General      Denies chills, fatigue, and fever.  Eyes      Denies  blurring, discharge, eye pain, and itching.  ENT      Denies ear discharge, earache, nasal congestion, postnasal drainage, and sore throat.  Resp      Denies cough and wheezing.  Derm      Complains of itching and rash.      spot on neck too   Physical Exam  General:     Well-developed,well-nourished,in no acute distress; alert,appropriate and cooperative throughout examination Head:     normocephalic, atraumatic, and no abnormalities observed.   Neck:     supple with full rom and no masses or thyromegally, no JVD or carotid bruit  Lungs:     Normal respiratory effort, chest expands symmetrically. Lungs are clear to  auscultation, no crackles or wheezes. Heart:     Normal rate and regular rhythm. S1 and S2 normal without gallop, murmur, click, rub or other extra sounds. Skin:     erythematous rash with some vesicles- in linear pattern on L cheek and chest diffuse redness around R eye (with nl appearing eye and no conj changes)- slt swelling no tenderness, no excoriations or drainage  Cervical Nodes:     No lymphadenopathy noted Psych:     talkative and pleasant    Impression & Recommendations:  Problem # 1:  CONTACT DERMATITIS&OTHER ECZEMA DUE UNSPEC CAUSE (ICD-692.9) Assessment: New very possibly poison ivy or chemical exposure adv to keep area clean and dry watch closely for signs of bact infx like fever, pain or inc redness pred as directed- short taper-- disc side eff to watch for  update if not imp in several days Her updated medication list for this problem includes:    Prednisone 10 Mg Tabs (Prednisone) .Marland Kitchen... Take by mouth as directed   Complete Medication List: 1)  Nexium 40 Mg Cpdr (Esomeprazole magnesium) .Marland Kitchen.. 1 by mouth once daily in am 2)  Adult Aspirin Low Strength 81 Mg Tbdp (Aspirin) .... One by mouth daily 3)  Calcium 1500 Mg Tabs (Calcium carbonate) .... Take by mouth as directed qd 4)  Cipro 250 Mg Tabs (Ciprofloxacin hcl) .Marland Kitchen.. 1 by mouth two  times a day for 5 days 5)  Prednisone 10 Mg Tabs (Prednisone) .... Take by mouth as directed   Patient Instructions: 1)  take prednisone as follows: 2)  take 3 pills once daily for 3 days 3)  then 2 pills once daily for 2 days 4)  then 1 pill once daily for 2 days  5)  then stop 6)  this may make you hyper and irritable and hungry 7)  should get better when you finish it  8)  benadryl is ok for itsh 9)  update me if rash worsens or if increasing redness around eye or any eye pain or fever 10)  try not to scratch    Prescriptions: PREDNISONE 10 MG  TABS (PREDNISONE) take by mouth as directed  #15 x 0   Entered and Authorized by:   Judith Part MD   Signed by:   Judith Part MD on 07/14/2007   Method used:   Print then Give to Patient   RxID:   [id number]  ]

## 2010-03-26 NOTE — Progress Notes (Signed)
Summary: Declined Orthop referra for hand stiffness...  Phone Note Outgoing Call   Summary of Call: Declined Orthop referral because of Finanical Reasons.  Pt says her hand is not hurt as bad.  She will call when ready.Daine Gip  January 13, 2010 2:06 PM

## 2010-03-26 NOTE — Assessment & Plan Note (Signed)
Summary: CHECK UP/CLE   Vital Signs:  Patient profile:   70 year old female Height:      64 inches Weight:      153 pounds BMI:     26.36 Temp:     97.8 degrees F oral Pulse rate:   68 / minute Pulse rhythm:   regular BP sitting:   120 / 60  (right arm) Cuff size:   regular  Vitals Entered By: Liane Comber (April 30, 2008 10:56 AM) Is Patient Diabetic? No Pain Assessment Patient in pain? no        History of Present Illness: has been doing ok overall  some stress - husb is in hosp with CABG and lung dz  last mam report 2002  last pap with gyn- all neg after hyst-- has not been to gyn for a while  no gyn problems overall  hyst in past with 1 ovary-- PID   no more bladder symptoms - last culture , drinks good water  IC bothers her on and off   colonosc with polyp 2004 with [facility]  no bowel changes at all   osteopenia- dexa 2002 was on actonel in past - then forgot to take it  low vit D imp with supplement - was 26 in sept  is not always compliant with her ca and vit D  not outside as much  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 Penicillin 2)  ! Sulfa 3)  ! Codeine 4)  ! Demerol 5)  ! Darvon  Past Medical History:    Reviewed history from 03/19/2008 and no changes required:       Hyperlipidemia       Osteoporosis (could not remb to take actonel in past )       IC       heel spurs       IBS       GERD              [facility]       cardiol-- [facility]       ortho - [facility]       rheum- [facility]       [facility]        ENT- [facility]  Past Surgical History:    Reviewed history from 03/17/2007 and no changes required:       Right arm fracture       Rotator cuff repair x 2       Hysterectomy- PID, 1 ovary left       Dexa- osteopenia  (08/2000)       Upper GI- hiatal hernia (09/2000)       Cholecystectomy (11/2000)       Cardiolite- neg (12/2001)       Colonoscopy- polyps (04/2002)  Family History:    Reviewed history from 08/30/2007 and no changes required:       Father: MI's, embolism, ? aneurysm, TB, CVA ETOH, HTN- died age 37, GERD       Mother: ETOH, MI's- died age 54, GERD       Siblings: 1 sister with female cancer, 1 sister- suicide, depressed and in bad marriage  Social History:  Reviewed history from 08/30/2007 and no changes required:       Marital Status: Married (husb had aneurysm- survived )       Children: 4       Occupation: retired       rides horses       non smoker- but exp to 2nd hand smoke from husband         Physical Exam  General:  Well-developed,well-nourished,in no acute distress; alert,appropriate and cooperative throughout examination hygiene is fair Head:  normocephalic and atraumatic.  no abnormalities observed.   Eyes:  vision grossly intact, pupils equal, pupils round, and pupils reactive to light.  no conjunctival pallor, injection or icterus  Ears:  R ear normal and L ear normal.   Nose:  no nasal discharge.  -nares are boggy Mouth:  pharynx pink and moist.   Neck:  supple with full rom and no masses or thyromegally, no JVD or carotid bruit  Breasts:  No mass, nodules, thickening, tenderness, bulging, retraction, inflamation, nipple discharge or skin changes noted.   Lungs:  Normal respiratory effort, chest expands symmetrically. Lungs are clear to auscultation, no crackles or wheezes. Heart:  Normal rate and regular rhythm. S1 and S2 normal without gallop, murmur, click, rub or other extra sounds. Abdomen:  Bowel sounds positive,abdomen soft and non-tender without masses, organomegaly or hernias noted. 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   Neurologic:  sensation intact to light touch, gait normal, and DTRs symmetrical and normal.   Skin:  Intact without suspicious lesions or rashes Cervical Nodes:  No lymphadenopathy noted Axillary Nodes:  No palpable lymphadenopathy Inguinal Nodes:  No significant adenopathy Psych:  nl affect , seems stressed when talking about her husband    Review of Systems General:  Denies fatigue, fever, loss of appetite, and malaise. Eyes:  Denies blurring and eye irritation. ENT:  Denies sinus pressure and sore throat. CV:  Denies chest pain or discomfort, palpitations, shortness of breath with exertion, and swelling of feet. Resp:  Denies cough and wheezing. GI:  Denies abdominal pain, bloody stools, change in bowel habits, and indigestion. GU:  Denies dysuria and urinary frequency. MS:  Complains of joint pain and stiffness; denies joint redness and joint swelling; terrible OA in feet. Derm:  Denies itching, lesion(s), and rash. Neuro:  Denies numbness and tingling. Psych:  stressed , but doing ok. Endo:  Denies cold intolerance, excessive thirst, excessive urination, and heat intolerance.  Impression & Recommendations:  Problem # 1:  OSTEOPOROSIS (ICD-733.00) Assessment Unchanged schedule dexa  prev on actonel - stopped due to inability to remember dosing  rev ca and vit D rec  vit D level today Her updated medication list for this problem includes:    Calcium 1500 Mg Tabs (Calcium carbonate) .Marland Kitchen... Take by mouth as directed qd  Orders: Radiology Referral (Radiology) Venipuncture [phone]) TLB-Renal Function Panel (80069-RENAL) TLB-TSH (Thyroid Stimulating Hormone) (84443-TSH)  Problem # 2:  HYPERLIPIDEMIA (ICD-272.4) Assessment: Unchanged rev labs from sept - not too bad- with  LDL in low 100s  rev low sat fat diet   Labs Reviewed: Chol: 184 (11/13/2007)   HDL: 67.7 (11/13/2007)   LDL: 104 (  11/13/2007)   TG: 61 (11/13/2007) SGOT: 20 (11/13/2007)   SGPT: 17 (11/13/2007)  Problem # 3:  UNSPECIFIED VITAMIN D DEFICIENCY (ICD-268.9) Assessment: Improved re check today- s/p suppl pt is not totally compliant with vit D- re emph impt of this for bone and general health Orders: T-Vitamin D (25-Hydroxy) (18841-66063)  Problem # 4:  COLONIC POLYPS (ICD-211.3) Assessment: Comment Only pt aware she is due for colonosc - declines scheduling it at this time  will call back this summer  if pt does not do colonosc- will plan on doing heme cards  adv to update if any stool changes Orders: Venipuncture (01601) TLB-CBC Platelet - w/Differential (85025-CBCD)  Problem # 5:  GERD (ICD-530.81) Assessment: Unchanged doing well with nexium - refilled it  disc diet  Her updated medication list for this problem includes:    Nexium 40 Mg Cpdr (Esomeprazole magnesium) .Marland Kitchen... 1 by mouth once daily in am as needed  Orders: Venipuncture (09323) TLB-CBC Platelet - w/Differential (85025-CBCD)  Problem # 6:  Preventive Health Care (ICD-V70.0) Assessment: Comment Only TD and pneumovax today health mt reviewed   Problem # 7:  OTHER SCREENING MAMMOGRAM (ICD-V76.12) Assessment: Comment Only mam sched  breast exam done- unremarkable enc regular self exams Orders: Radiology Referral (Radiology)  Complete Medication List: 1)  Nexium 40 Mg Cpdr (Esomeprazole magnesium) .Marland Kitchen.. 1 by mouth once daily in am as needed 2)  Adult Aspirin Low Strength 81 Mg Tbdp (Aspirin) .... One by mouth daily 3)  Calcium 1500 Mg Tabs (Calcium carbonate) .... Take by mouth as directed qd 4)  Ibuprofen 600 Mg Tabs (Ibuprofen) .Marland Kitchen.. 1 by mouth with food up to three times a day as needed pain 5)  Glucosamine-chondroitin Caps (Glucosamine-chondroit-vit c-mn) .... Take 1 tablet by mouth once a day 6)   Multivitamins Tabs (Multiple vitamin) .... Take 1 tablet by mouth once a day 7)  Fish Oil Oil (Fish oil) .... Take 1 tablet by mouth once a day  Other Orders: Pneumoccal Vaccine Adm- Medicare (G0009) Admin 1st Vaccine (55732) Tetanus Toxoid w/Dx (20254) Admin of Any Addtl Vaccine (27062)  Patient Instructions: 1)  you are due for screening colonoscopy- please call me when you are ready to schedule it, and I will do a referral 2)  we will set up mammogram and dexa at check out  3)  tetnus shot and pneumonia vaccine today 4)  labs including vit D level today 5)  keep working on healthy diet and exercise  6)  wear sunblock for skin cancer protection 7)  the current recommendation for calcium intake is 1200-1500 mg daily with [phone] IU of vitamin D  8)  The medication list was reviewed and reconciled.  All changed / newly prescribed medications were explained.  A complete medication list was provided to the patient / caregiver. Prescriptions: NEXIUM 40 MG CPDR (ESOMEPRAZOLE MAGNESIUM) 1 by mouth once daily in am as needed  #30 x 11   Entered and Authorized by:   Judith Part MD   Signed by:   Judith Part MD on 04/30/2008   Method used:   Print then Give to Patient   RxID:   [id number]        Prior Medications (reviewed today): NEXIUM 40 MG CPDR (ESOMEPRAZOLE MAGNESIUM) 1 by mouth once daily in am as needed ADULT ASPIRIN LOW STRENGTH 81 MG  TBDP (ASPIRIN) one by mouth daily CALCIUM 1500 MG  TABS (CALCIUM CARBONATE) take by mouth as directed qd IBUPROFEN 600 MG TABS (  IBUPROFEN) 1 by mouth with food up to three times a day as needed pain GLUCOSAMINE-CHONDROITIN   CAPS (GLUCOSAMINE-CHONDROIT-VIT C-MN) Take 1 tablet by mouth once a day MULTIVITAMINS   TABS (MULTIPLE VITAMIN) Take 1 tablet by mouth once a day FISH OIL   OIL (FISH OIL) Take 1 tablet by mouth once a day Current Allergies: ! PENICILLIN ! SULFA ! CODEINE ! DEMEROL ! DARVON Current Medications (including  changes made in today's visit):  NEXIUM 40 MG CPDR (ESOMEPRAZOLE MAGNESIUM) 1 by mouth once daily in am as needed ADULT ASPIRIN LOW STRENGTH 81 MG  TBDP (ASPIRIN) one by mouth daily CALCIUM 1500 MG  TABS (CALCIUM CARBONATE) take by mouth as directed qd IBUPROFEN 600 MG TABS (IBUPROFEN) 1 by mouth with food up to three times a day as needed pain GLUCOSAMINE-CHONDROITIN   CAPS (GLUCOSAMINE-CHONDROIT-VIT C-MN) Take 1 tablet by mouth once a day MULTIVITAMINS   TABS (MULTIPLE VITAMIN) Take 1 tablet by mouth once a day FISH OIL   OIL (FISH OIL) Take 1 tablet by mouth once a day    Preventive Care Screening  Colonoscopy:    Date:  05/10/2002    Results:  Adenomatous Polyp      gets side eff from flu shot    Tetanus/Td Vaccine    Vaccine Type: Td    Site: left deltoid    Mfr: Sanofi Pasteur    Dose: 0.5 ml    Route: IM    Given by: Liane Comber    Exp. Date: 07/17/2009    Lot #: [id number]    VIS given: 01/10/07 version given April 30, 2008.  Pneumovax Vaccine    Vaccine Type: Pneumovax (Medicare)    Site: right deltoid    Mfr: Merck    Dose: 0.5 ml    Route: IM    Given by: Liane Comber    Exp. Date: 03/29/2009    Lot #: [id number]    VIS given: 09/20/95 version given April 30, 2008.

## 2010-03-26 NOTE — Assessment & Plan Note (Signed)
Summary: BLADDER INFECTION?/BIR   Vital Signs:  Patient profile:   70 year old female Weight:      154 pounds Temp:     97.8 degrees F oral Pulse rate:   60 / minute Pulse rhythm:   regular BP sitting:   110 / 68  (left arm) Cuff size:   regular  Vitals Entered By: Sydell Axon (Jul 16, 2008 11:41 AM)  History of Present Illness: having bladder sympotms started on friday with a litte bit of burning/irritation with urination last night - had a bad night- had to urinate all the time with worse burning and not much volume  is drinking lots of water  is very careful when she wipes - and using baby wipes after bms - did change brands   a little nausea with no vomiting  a lot of pain overbladder last  nt- now just bloated feeling  low  back aches just a little bit   ankles are a little swollen today  used Dmannose powder/herbal remedy otc- this reduced the pain also tried some uristat   last vit D low- despite 8 weeks of high dose tx  occ gets some tingling in her acessories  no otc vit D      Allergies: 1)  ! Penicillin 2)  ! Sulfa 3)  ! Codeine 4)  ! Demerol 5)  ! Darvon  Past History:  Past Medical History:    Hyperlipidemia    Osteoporosis (could not remb to take actonel in past )    IC    heel spurs    IBS    GERD    [facility]    cardiol-- [facility]    ortho - [facility]    rheum- [facility]    [facility]     [facility]- [facility]     (04/30/2008)  Past Surgical History:    Right arm fracture    Rotator cuff repair x 2    Hysterectomy- PID, 1 ovary left    Dexa- osteopenia (08/2000)    Upper GI- hiatal hernia (09/2000)    Cholecystectomy (11/2000)    Cardiolite- neg (12/2001)    Colonoscopy- polyps (04/2002)     (03/17/2007)  Family History:    Father: MI's, embolism, ? aneurysm, TB, CVA ETOH, HTN- died age 58, GERD    Mother: ETOH, MI's- died age 66, GERD    Siblings: 1 sister with female cancer, 1 sister- suicide, depressed and in bad marriage  (08/30/2007)  Social History:    Marital Status: Married (husb had aneurysm- survived )    Children: 4    Occupation: retired    rides horses    non smoker- but exp to 2nd hand smoke from husband     (04/30/2008)  Review of Systems General:  Denies fatigue, fever, loss of appetite, and malaise. CV:  Denies chest pain or discomfort and palpitations. Resp:  Denies cough and wheezing. GI:  Denies abdominal pain, bloody stools, change in bowel habits, nausea, and vomiting. GU:  Complains of dysuria and urinary frequency; denies hematuria and urinary hesitancy. Derm:  Denies itching. Endo:  Denies excessive thirst.  Physical Exam  General:  Well-developed,well-nourished,in no acute distress; alert,appropriate and cooperative throughout examination hygiene is fair Head:  normocephalic, atraumatic, and no abnormalities observed.   Eyes:  vision grossly intact, pupils equal, pupils round, and pupils reactive to light.   Neck:  supple with full rom and no masses or thyromegally, no JVD or carotid bruit  Lungs:  Normal respiratory effort, chest expands symmetrically. Lungs are clear to auscultation, no crackles or wheezes. Heart:  Normal rate and regular rhythm. S1 and S2 normal without gallop, murmur, click, rub or other extra sounds. Abdomen:  mild surapubic tenderness without rebound or gaurding  normal bowel sounds, no distention, no masses, no hepatomegaly, and no splenomegaly.   Msk:  very slt L cva tenderness   Extremities:  No clubbing, cyanosis, edema, or deformity noted with normal full range of motion of all joints.   Skin:  Intact without suspicious lesions or rashes Cervical Nodes:  No lymphadenopathy noted Inguinal Nodes:  No significant adenopathy Psych:  normal affect, talkative and pleasant    Impression & Recommendations:  Problem # 1:  UTI (ICD-599.0) Assessment New  with ua micro-many wbc will tx with cipro cx urine - update  recommend sympt care- see pt  instructions   Her updated medication list for this problem includes:    Cipro 250 Mg Tabs (Ciprofloxacin hcl) .Marland Kitchen... 1 by mouth two times a day for 7 days  Orders: T-Culture, Urine ([address]-60454) UA Dipstick W/ Micro (manual) (09811)  Problem # 2:  UNSPECIFIED VITAMIN D DEFICIENCY (ICD-268.9) Assessment: Deteriorated no imp with 10 weeks high dose tx  will continue 50,000 international units weekly for another 12 weeks and add to that 800 international units daily otc  pt is asymptomatic except for some tingling in fingers/toes occas fair amt of sun exposure   Complete Medication List: 1)  Nexium 40 Mg Cpdr (Esomeprazole magnesium) .Marland Kitchen.. 1 by mouth once daily in am as needed 2)  Adult Aspirin Low Strength 81 Mg Tbdp (Aspirin) .... One by mouth daily 3)  Calcium 1500 Mg Tabs (Calcium carbonate) .... Take by mouth as directed qd 4)  Ibuprofen 600 Mg Tabs (Ibuprofen) .Marland Kitchen.. 1 by mouth with food up to three times a day as needed pain 5)  Glucosamine-chondroitin Caps (Glucosamine-chondroit-vit c-mn) .... Take 1 tablet by mouth once a day 6)  Fish Oil Oil (Fish oil) .... Take 1 tablet by mouth once a day 7)  Vitamin D 91478 Unit Caps (Ergocalciferol) .... One cap by mouth once weekly 8)  D-mannose Powder  .... As needed for uti symptoms 9)  Cipro 250 Mg Tabs (Ciprofloxacin hcl) .Marland Kitchen.. 1 by mouth two times a day for 7 days  Patient Instructions: 1)  continue drinking lots of water 2)  call or seek care is symptoms don't improve in 2-3 days or if you develop back pain, nausea, or vomiting  3)  take the cipro as directed  4)  I will update you when urine culture returns  5)  continue the weekly vitamin D - for 12 more weeks 6)  also take vitamin D otc 800 international units daily  7)  schedule labs in 12 weeks for vit D level for vit d deficiency  Prescriptions: CIPRO 250 MG TABS (CIPROFLOXACIN HCL) 1 by mouth two times a day for 7 days  #14 x 0   Entered and Authorized by:   Judith Part  MD   Signed by:   Judith Part MD on 07/16/2008   Method used:   Print then Give to Patient   RxID:   [id number] VITAMIN D 62952 UNIT  CAPS (ERGOCALCIFEROL) one cap by mouth once weekly  #12 x 0   Entered and Authorized by:   Judith Part MD   Signed by:   Judith Part MD on 07/16/2008   Method used:  Print then Give to Patient   RxID:   [id number]     Current Allergies (reviewed today): ! PENICILLIN ! SULFA ! CODEINE ! DEMEROL ! DARVON  Laboratory Results   Urine Tests  Date/Time Received: Jul 16, 2008 11:55 AM  Date/Time Reported: Jul 16, 2008 11:55 AM   Routine Urinalysis   Color: orange Appearance: Hazy Glucose: negative   (Normal Range: Negative)  Urine Microscopic WBC/HPF: many RBC/HPF: 3-4 Bacteria/HPF: many Mucous/HPF: few Epithelial/HPF: 1-2 Crystals/HPF: 0 Casts/LPF: 0 Yeast/HPF: 0 Other: 0    Comments: Hard to read dip stick because patient took OTC medication that turned the urine orange    Laboratory Results   Urine Tests    Routine Urinalysis   Color: orange Appearance: Hazy Glucose: negative   (Normal Range: Negative)  Urine Microscopic WBC/hpf: many RBC/hpf: 3-4 Bacteria: many Mucous: few Epithelial: 1-2 Crystals/LPF: 0 Casts/LPF: 0 Yeast/HPF: 0 Other: 0    Comments: Hard to read dip stick because patient took OTC medication that turned the urine orange

## 2010-03-26 NOTE — Progress Notes (Signed)
Summary: wants to leave urine sample  Phone Note Call from Patient Call back at Home Phone [phone]   Caller: Patient Call For: Judith Part MD Summary of Call: Pt. states she has blood in her urine and pain and burning.  She has appt to see you tomorrow but is asking if she can come in today to leave a urine sample.  Please advise.  [facility].  Please leave a message if she doesnt answer. Initial call taken by: Lowella Petties,  March 18, 2008 9:47 AM  Follow-up for Phone Call        that is fine to drop off sample send this back to me as phone note when it arrives Follow-up by: Judith Part MD,  March 18, 2008 10:43 AM  Additional Follow-up for Phone Call Additional follow up Details #1::        Advised patient.  ......................................................Marland KitchenLiane Comber March 18, 2008 11:54 AM pt came in and gave specimen Liane Comber  March 18, 2008 3:53 PM see comment under nurse visit  Additional Follow-up by: Judith Part MD,  March 18, 2008 5:02 PM

## 2010-03-26 NOTE — Assessment & Plan Note (Signed)
Summary: TICK BITE/ 2:15   Vital Signs:  Patient profile:   70 year old female Height:      64 inches Weight:      158.6 pounds BMI:     27.32 Temp:     98.3 degrees F oral Pulse rate:   72 / minute Pulse rhythm:   regular BP sitting:   118 / 80  (left arm) Cuff size:   regular  Vitals Entered By: Benny Lennert CMA Duncan Dull) (June 11, 2009 11:02 AM)  History of Present Illness: Chief complaint Tick bite on abdomen  Patient bit by tick and now has a red spot approx 4 cm across on abdomen where she was bitten  ROS no fever, chills sweats or systemic symptoms  GEN: Well-developed,well-nourished,in no acute distress; alert,appropriate and cooperative throughout examination HEENT: Normocephalic and atraumatic without obvious abnormalities. No apparent alopecia or balding. Ears, externally no deformities PULM: Breathing comfortably in no respiratory distress EXT: No clubbing, cyanosis, or edema PSYCH: Normally interactive. Cooperative during the interview. Pleasant. Friendly and conversant. Not anxious or depressed appearing. Normal, full affect.   ABD: 4 cm across, non-fluctuant red area. minimally TTP  Allergies: 1)  ! Penicillin 2)  ! Sulfa 3)  ! Codeine 4)  ! Demerol 5)  ! Darvon   Impression & Recommendations:  Problem # 1:  CELLULITIS, ABDOMEN (ICD-682.2) Assessment New Will cover with doxy - coverage for cellultis and given tick bite, reasonable to do full 14 days  The following medications were removed from the medication list:    Minocin 100 Mg Caps (Minocycline hcl) .Marland Kitchen... 1 two times a day for uri Her updated medication list for this problem includes:    Doxycycline Hyclate 100 Mg Caps (Doxycycline hyclate) .Marland Kitchen... Take 1 tab twice a day  Orders: Prescription Created Electronically [phone])  Complete Medication List: 1)  Adult Aspirin Low Strength 81 Mg Tbdp (Aspirin) .... One by mouth daily 2)  Fish Oil Oil (Fish oil) .... Take 1 tablet by mouth once a day as  needed 3)  Vitamin D 2000 Unit Tabs (Cholecalciferol) .... Take 1 tablet by mouth once a day 4)  Doxycycline Hyclate 100 Mg Caps (Doxycycline hyclate) .... Take 1 tab twice a day Prescriptions: DOXYCYCLINE HYCLATE 100 MG CAPS (DOXYCYCLINE HYCLATE) Take 1 tab twice a day  #28 x 0   Entered and Authorized by:   Hannah Beat MD   Signed by:   Hannah Beat MD on 06/11/2009   Method used:   [facility]        [facility] (retail)       [address]       [place], [place]  [address]       Ph: [phone] or [phone]       Fax: [phone]   RxID:   [id number]   Current Allergies (reviewed today): ! PENICILLIN ! SULFA ! CODEINE ! DEMEROL ! DARVON

## 2010-03-26 NOTE — Progress Notes (Signed)
Summary: fell off a horse  Phone Note Call from Patient   Caller: Patient Call For: Judith Part MD Summary of Call: Pt called and said she fell off a horse and has hurt her arm and shoulder.  Per Dr. Milinda Antis, advised pt that if she thinks she is alright she can use ice tonight and update her tomorrow.  If she doesnt think she is alright then she should go to an urgent care or ER for evaluation and x-rays.  Pt will call tomorrow and let us know. Initial call taken by: Lowella Petties CMA,  September 16, 2008 4:34 PM
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
## 2010-03-26 NOTE — Progress Notes (Signed)
Summary: Chest pain  Phone Note Call from Patient Call back at [phone]   Caller: Patient Call For: Dr. Milinda Antis Summary of Call: Pt has had a problem with bloating, cramping and bowel changes for about a week..  Pt had some back pain and chest pain earlier today and she took an aspirin and felt some better and her right arm was tingling.  Pt is feeling better now.  Pt has had some SOB when moving around.  Pt has not seen you in 2 years. Initial call taken by: Sydell Axon,  Jun 27, 2007 3:21 PM  Follow-up for Phone Call        needs to schedule f/u with me when able if chest pain returns or any inc in sob (or any new acute symptoms)- go to ER for eval  Follow-up by: Judith Part MD,  Jun 27, 2007 3:33 PM  Additional Follow-up for Phone Call Additional follow up Details #1::        Pt notified as instructed.  Appt scheduled with Dr. Milinda Antis tomorrow at 11:45 am. Additional Follow-up by: Sydell Axon,  Jun 27, 2007 3:35 PM

## 2010-03-26 NOTE — Progress Notes (Signed)
Summary: vomited this morning  Phone Note Call from Patient Call back at Home Phone [phone]   Caller: Patient Call For: Judith Part MD Summary of Call: Pt has been taking cipro x 3 days and took her dose this morning and vomited.  She had not had vomiting before then.  She took the medicine on an empty stomach so I told her to take it with food.  Do you want her to continue this or change to something else?  Uses [facility]. Initial call taken by: Lowella Petties,  Jul 19, 2008 1:23 PM  Follow-up for Phone Call        I want her to try it again with full meal- then update me if it causes nausea again Follow-up by: Judith Part MD,  Jul 19, 2008 1:27 PM  Additional Follow-up for Phone Call Additional follow up Details #1::        left message  to call me back Liane Comber  Jul 19, 2008 2:17 PM Advised patient  Additional Follow-up by: Liane Comber,  Jul 19, 2008 3:08 PM

## 2010-03-26 NOTE — Progress Notes (Signed)
Summary: ? check for diabetes  Phone Note Call from Patient Call back at [phone]   Caller: Patient Call For: Dr. Milinda Antis Summary of Call: Pt saw you this am and forgot to ask you if it is okay for her to do alot of physical labor since she has blood in her urine, she needs to clean out her horses stalls.  Also, pt wants to know if you can check her for diabetes when she comes back in, pt has an appt. in a couple of weeks. Initial call taken by: Sydell Axon,  Jun 28, 2007 2:05 PM  Follow-up for Phone Call        physical work is ok will check sugar at follow up Follow-up by: Judith Part MD,  Jun 28, 2007 3:11 PM  Additional Follow-up for Phone Call Additional follow up Details #1::        advised pt Additional Follow-up by: Lowella Petties,  Jun 28, 2007 3:30 PM
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
## 2010-03-26 NOTE — Miscellaneous (Signed)
Summary: INSURANCE CARD  INSURANCE CARD   Imported By: Carrie Mew 07/29/2009 11:36:25  _____________________________________________________________________  External Attachment:    Type:   Image     Comment:   External Document

## 2010-03-26 NOTE — Assessment & Plan Note (Signed)
Summary: URINE SAMPLE/TOWER/CLE  Nurse Visit    Prior Medications: NEXIUM 40 MG CPDR (ESOMEPRAZOLE MAGNESIUM) 1 by mouth once daily in am ADULT ASPIRIN LOW STRENGTH 81 MG  TBDP (ASPIRIN) one by mouth daily CALCIUM 1500 MG  TABS (CALCIUM CARBONATE) take by mouth as directed qd IBUPROFEN 600 MG TABS (IBUPROFEN) 1 by mouth with food up to three times a day as needed pain Current Allergies: ! PENICILLIN ! SULFA ! CODEINE ! DEMEROL ! DARVON Laboratory Results   Urine Tests  Date/Time Received: March 28, 2008 12:30 PM Date/Time Reported: March 28, 2008 12:30 PM  Routine Urinalysis   Color: orange Appearance: Clear  Urine Microscopic WBC/HPF: 1 RBC/HPF: 0 Bacteria/HPF: few Mucous/HPF: few Epithelial/HPF: 1-2 Crystals/HPF: few Casts/LPF: 0 Yeast/HPF: 0 Other: 0    Comments: urine was orange, could not get an accurate reading from dip. Urine has been spun. spin looks fairly negative- please send for culture - I do not want to extend abx unless cx is pos  adv her to keep up good water intake    pt's husband advised Liane Comber  March 28, 2008 2:43 PM        Orders Added: 1)  T-Culture, Urine [[id number]] 2)  UA Dipstick W/ Micro (manual) [81000]    ]

## 2010-03-26 NOTE — Procedures (Signed)
Summary: Colonoscopy by Dr.James Edwards,[facility]  Colonoscopy by Dr.James Edwards,[facility]   Imported By: Beau [name] 09/30/2009 10:02:45  _____________________________________________________________________  External Attachment:    Type:   Image     Comment:   External Document  Appended Document: Colonoscopy by Dr.James Edwards,[facility]    Clinical Lists Changes  Observations: Added new observation of COLONOSCOPY: normal (09/24/2009 20:36)       Preventive Care Screening  Colonoscopy:    Date:  09/24/2009    Results:  normal

## 2010-03-26 NOTE — Assessment & Plan Note (Signed)
Summary: CHECK UP/CLE   Vital Signs:  Patient profile:   70 year old female Height:      64 inches Weight:      158.75 pounds BMI:     27.35 Temp:     97.9 degrees F oral Pulse rate:   84 / minute Pulse rhythm:   regular BP sitting:   122 / 74  (left arm) Cuff size:   regular  Vitals Entered By: Lewanda Rife LPN (August 01, 2009 2:54 PM) CC: CPX LMP part hyst 1985   History of Present Illness: here for check up of chronic med problems and also to rev health mt list   wt is stable   HTN well controlled 122/74 today   colon polyp 04 - wants to re check that   lipids up with LDL 144 (from 104)  too much snacking and sitting around -- eating ice cream   dexa 2010 - osteopenia -- vit D is low at 28 ca and d-- does forget a lot  no fractures    hyst in past - 1 ovary  pap nl in 02 no gyn symptoms / no problems   mam 3/10 self exam - no breast lumps  has lump under her mid arm-- fatty tumor - does not change   Td 2010 ptx up to date shingles status   hyperlycemia AIC 6.0- fairly stable  is eating a lot of sweets   occas urinary discomfort -- ua is nl today  Allergies: 1)  ! Penicillin 2)  ! Sulfa 3)  ! Codeine 4)  ! Demerol 5)  ! Darvon  Past History:  Past Medical History: Last updated: 04/30/2008 Hyperlipidemia Osteoporosis (could not remb to take actonel in past ) IC heel spurs IBS GERD  [facility]-- [facility] - [place] [facility]- Devishwar [facility]  [facility]- [facility]  Past Surgical History: Last updated: 03/17/2007 Right arm fracture Rotator cuff repair x 2 Hysterectomy- PID, 1 ovary left Dexa- osteopenia (08/2000) Upper GI- hiatal hernia (09/2000) Cholecystectomy (11/2000) Cardiolite- neg (12/2001) Colonoscopy- polyps (04/2002)  Family History: Last updated: 08/30/2007 Father: MI's, embolism, ? 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 Eyes:  Denies blurring and eye pain. CV:  Denies chest pain or discomfort, palpitations, shortness of breath with exertion, and swelling of feet. Resp:  Denies cough, shortness of breath, and wheezing. GI:  Denies abdominal pain, bloody stools, change in bowel habits, indigestion, nausea, and vomiting. GU:  Complains of dysuria; denies abnormal vaginal bleeding, hematuria, and urinary frequency. MS:  Complains of stiffness; denies joint redness, joint swelling, cramps, and muscle weakness. Derm:  Denies itching, lesion(s), poor wound healing, and rash. Neuro:  Denies numbness and tingling. Psych:  Denies anxiety and depression. Endo:  Denies cold intolerance, excessive thirst, excessive urination, and heat intolerance. Heme:  Denies abnormal bruising and bleeding.  Physical Exam  General:  hygiene is fair, mildly overwt   Head:  normocephalic, atraumatic, and no abnormalities observed.   Eyes:  vision grossly intact, pupils equal, pupils round, and pupils reactive to light.  no conjunctival pallor, injection or icterus  Ears:  R ear normal and L ear normal.   Nose:  no nasal discharge.   Mouth:  pharynx pink and moist.   Neck:  supple with full rom and no masses or thyromegally, no JVD or carotid bruit  Chest Wall:  No deformities, masses, or tenderness noted. Breasts:  No mass, nodules, thickening, tenderness, bulging, retraction, inflamation, nipple discharge or skin changes noted.   Lungs:  Normal respiratory effort, chest expands symmetrically. Lungs are clear to auscultation, no crackles or wheezes. Heart:   Normal rate and regular rhythm. S1 and S2 normal without gallop, murmur, click, rub or other extra sounds. Abdomen:  Bowel sounds positive,abdomen soft and non-tender without masses, organomegaly or hernias noted. no renal bruits  Msk:  No deformity or scoliosis noted of thoracic or lumbar spine.  no acute joint changes  Pulses:  R and L carotid,radial,femoral,dorsalis pedis and posterior tibial pulses are full and equal bilaterally Extremities:  No clubbing, cyanosis, edema, or deformity noted with normal full range of motion of all joints.   Neurologic:  sensation intact to light touch, gait normal, and DTRs symmetrical and normal.   Skin:  lipoma R upper arm 2-3 cm  and mobile  some lentigos diffusely Cervical Nodes:  No lymphadenopathy noted Axillary Nodes:  No palpable lymphadenopathy Inguinal Nodes:  No significant adenopathy Psych:  normal affect, talkative and pleasant    Impression & Recommendations:  Problem # 1:  HYPERGLYCEMIA (ICD-790.29) Assessment Deteriorated with AIC 6.0 and bad diet  disc healthy diet (low simple sugar/ choose complex carbs/ low sat fat) diet and exercise in detail  lab and f/u in 6 mo   Problem # 2:  UNSPECIFIED VITAMIN D DEFICIENCY (ICD-268.9) Assessment: Deteriorated  will take 10 week course of high dose tx and get some outdoor time  then 1000 international units daily and re check in 6 mo   Orders: Prescription Created Electronically [id number])  Problem # 3:  OTHER SCREENING MAMMOGRAM (ICD-V76.12) Assessment: Comment Only annual mammogram scheduled adv pt to continue regular self breast exams non remarkable breast exam today  Orders: Radiology Referral (Radiology)  Problem # 4:  COLONIC POLYPS (ICD-211.3) Assessment: Unchanged sched 5 year colonosc for adenomatous polyps  Orders: Gastroenterology Referral (GI)  Problem # 5:  HYPERLIPIDEMIA (ICD-272.4) Assessment: Deteriorated  this is worse with poor diet rev low sat fat diet in  detail  lab 6 mo and f/u  Labs Reviewed: SGOT: 19 (07/29/2009)   SGPT: 17 (07/29/2009)   HDL:85.60 (07/29/2009), 67.7 (11/13/2007)  LDL:104 (11/13/2007), DEL (08/30/2007)  Chol:247 (07/29/2009), 184 (11/13/2007)  Trig:69.0 (07/29/2009), 61 (11/13/2007)  Complete Medication List: 1)  Adult Aspirin Low Strength 81 Mg Tbdp (Aspirin) .... One by mouth daily 2)  Fish Oil Oil (Fish oil) .... Take 1 tablet by mouth once a day as needed 3)  Vitamin D 2000 Unit Tabs (Cholecalciferol) .... Take 1 tablet by mouth once a day 4)  Caltrate 600 1500 Mg Tabs (Calcium carbonate) .... Take 1 tablet by mouth once a day 5)  Pro-biotic Blend Caps (Probiotic product) .... Otc as directed. 6)  Otc Memory Med Pt Not Sure of Name  .... Otc as directed. 7)  Vitamin D 1000 Unit Tabs (Cholecalciferol) .... Take 1 tablet by mouth once a day 8)  Vitamin D (ergocalciferol) 50000 Unit Caps (Ergocalciferol) .Marland Kitchen.. 1 by mouth once weekly for 10 weeks  Other Orders: UA Dipstick W/ Micro (manual) (29562)  Patient Instructions: 1)  the current recommendation for calcium intake is 1200-1500 mg daily with-1000 IU of vitamin D  2)  if you are interested in a shinles vaccine in the future - call insurance to check coverage and call us to schedule at end of summer  3)  watch fats and sweets in diet -- you are on the borderline for diabetes and your cholesterol is high  4)  schedule fasting labs and then followup  in 6 months lipid/ast/alt/AIC 272, hyperglycemia , and vit D level for D deficiency 5)  we will schedule mammogram and colonoscopy at check out  6)  take 1 50,000 international units of vit D once weekly for 10 weeks and then take 1000 international units vit D per day when you are done  Prescriptions: VITAMIN D (ERGOCALCIFEROL) 50000 UNIT CAPS (ERGOCALCIFEROL) 1 by mouth once weekly for 10 weeks  #10 x 0   Entered and Authorized by:   Judith Part MD   Signed by:   Judith Part MD on 08/01/2009   Method used:    [facility]        [facility] (retail)       [address]       [place], [place]  [address]       Ph: [phone] or [phone]       Fax: [phone]   RxID:   [id number]   Current Allergies (reviewed today): ! PENICILLIN ! SULFA ! CODEINE ! DEMEROL ! DARVON     Laboratory Results   Urine Tests  Date/Time Received: August 01, 2009 5:01 PM  Date/Time Reported: August 01, 2009 5:01 PM   Routine Urinalysis   Color: yellow Appearance: Hazy Glucose: negative   (Normal Range: Negative) Bilirubin: negative   (Normal Range: Negative) Ketone: negative   (Normal Range: Negative) Spec. Gravity: 1.015   (Normal Range: 1.003-1.035) Blood: trace-intact   (Normal Range: Negative) pH: 7.0   (Normal Range: 5.0-8.0) Protein: trace   (Normal Range: Negative) Urobilinogen: 0.2   (Normal Range: 0-1) Nitrite: negative   (Normal Range: Negative) Leukocyte Esterace: negative   (Normal Range: Negative)  Urine Microscopic WBC/HPF: 0 RBC/HPF: 0 Bacteria/HPF: 0 Mucous/HPF: few Epithelial/HPF: 1 Crystals/HPF: 0 Casts/LPF: 0 Yeast/HPF: 0 Other: 0

## 2010-03-26 NOTE — Assessment & Plan Note (Signed)
Summary: F/U AFTER LABS / LFW   Vital Signs:  Patient profile:   70 year old female Height:      64 inches Weight:      157.75 pounds BMI:     27.18 Temp:     97.5 degrees F oral Pulse rate:   76 / minute Pulse rhythm:   regular BP sitting:   130 / 68  (left arm) Cuff size:   regular  Vitals Entered By: Lewanda Rife LPN (February 02, 2010 11:36 AM) CC: six month f/u after labs (pt did not go for labs), Lipid Management   History of Present Illness: here for f/u of lipids and hyperglycemia and vit D def  overall doing pretty good  did not see hand doctor yet due to finances   wt is down 1 lb  bp good at 130/68  tsh nl   AIC is 6.1 fairly stable  diet - is better watching her sugar in diet  no sugar drinks  very rarely has orange juice    last LDL chol in june was 140s diet - has tried to stay away from fried foods and red meat   vit D due to check next draw  treated her with high dose tx over the summer  takes vit D sporatically -- ? upsets stomach   has trouble sleeping -- cannot fall asleep  she tried advil pm - helps some  has tried her husbands ambien  melatonin does not help   Lipid Management History:      Positive NCEP/ATP III risk factors include female age 71 years old or older.  Negative NCEP/ATP III risk factors include HDL cholesterol greater than 60 and non-tobacco-user status.    Allergies: 1)  ! Penicillin 2)  ! Sulfa 3)  ! Codeine 4)  ! Demerol 5)  ! Darvon 6)  ! 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 Eyes:  Denies blurring and eye  irritation. CV:  Denies chest pain or discomfort, lightheadness, and palpitations. Resp:  Denies cough, morning headaches, shortness of breath, and wheezing. GI:  Denies abdominal pain and change in bowel habits. GU:  Denies dysuria. Derm:  Denies itching, lesion(s), poor wound healing, and rash. Neuro:  Denies numbness and tingling. Psych:  Denies depression; mood is fair . Endo:  Denies cold intolerance, excessive thirst, excessive urination, and heat intolerance. Heme:  Denies abnormal bruising and bleeding.   Impression & Recommendations:  Problem # 1:  HYPERGLYCEMIA (ICD-790.29) Assessment Unchanged this is stable with diet control disc healthy diet (low simple sugar/ choose complex carbs/ low sat fat) diet and exercise in detail  will continue to monitor  Problem # 2:  UNSPECIFIED VITAMIN D DEFICIENCY (ICD-268.9) Assessment: Unchanged labs today disc imp of compliance with D suppl did take high dose suppl over the summer Orders: Venipuncture (91478) TLB-Lipid Panel (80061-LIPID) TLB-ALT (SGPT) (84460-ALT) TLB-AST (SGOT) (84450-SGOT) T-Vitamin D (25-Hydroxy) (29562-13086)  Problem # 3:  HYPERLIPIDEMIA (ICD-272.4) Assessment: Unchanged  hope for imp with better diet rev low sat fat diet in detail  lab today and update Orders: Venipuncture (57846) TLB-Lipid Panel (80061-LIPID) TLB-ALT (SGPT) (  84460-ALT) TLB-AST (SGOT) (84450-SGOT) T-Vitamin D (25-Hydroxy) [phone])  Labs Reviewed: SGOT: 19 (07/29/2009)   SGPT: 17 (07/29/2009)   HDL:85.60 (07/29/2009), 67.7 (11/13/2007)  LDL:104 (11/13/2007), DEL (08/30/2007)  Chol:247 (07/29/2009), 184 (11/13/2007)  Trig:69.0 (07/29/2009), 61 (11/13/2007)  Problem # 4:  INSOMNIA (ICD-780.52) Assessment: New worse with age disc imp of exercise and sleep hygiene and caff avoidance handout given from aafp  will try volarian and update  Complete Medication List: 1)  Adult Aspirin Low Strength 81 Mg Tbdp (Aspirin) .... One  by mouth daily 2)  Fish Oil Oil (Fish oil) .... Take 1 tablet by mouth once a day as needed 3)  Caltrate 600 1500 Mg Tabs (Calcium carbonate) .... Take 1 tablet by mouth once a day 4)  Pro-biotic Blend Caps (Probiotic product) .... Otc as directed. 5)  Vitamin D 1000 Unit Tabs (Cholecalciferol) .... Take 1 tablet by mouth once a day 6)  Ibuprofen 200 Mg Tabs (Ibuprofen) .... Otc as directed.  Lipid Assessment/Plan:      Based on NCEP/ATP III, the patient's risk factor category is "0-1 risk factors".  The patient's lipid goals are as follows: Total cholesterol goal is 200; LDL cholesterol goal is 160; HDL cholesterol goal is 40; Triglyceride goal is 150.     Patient Instructions: 1)  watch diet for both sugar and cholesterol  2)  you can raise your HDL (good cholesterol) by increasing exercise and eating omega 3 fatty acid supplement like fish oil or flax seed oil over the counter 3)  you can lower LDL (bad cholesterol) by limiting saturated fats in diet like red meat, fried foods, egg yolks, fatty breakfast meats, high fat dairy products and shellfish  4)  try volarian (herb) for sleep - is over the counter  5)  here is a handout on ways to improve sleep 6)  try to get good exercise every day 7)  labs today for vit D and cholesterol  8)  please take your calcium and vit D daily   Orders Added: 1)  Venipuncture [36415] 2)  TLB-Lipid Panel [80061-LIPID] 3)  TLB-ALT (SGPT) [84460-ALT] 4)  TLB-AST (SGOT) [84450-SGOT] 5)  T-Vitamin D (25-Hydroxy) [36644-03474] 6)  Est. Patient Level IV [25956]    Current Allergies (reviewed today): ! PENICILLIN ! SULFA ! CODEINE ! DEMEROL ! DARVON ! TRAMADOL HCL (TRAMADOL HCL)
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
## 2010-03-26 NOTE — Progress Notes (Signed)
Summary: not any better, want abx  Phone Note Call from Patient Call back at Home Phone [phone]   Caller: Patient Call For: Billie Gianno Volner Summary of Call: Pt was seen for URI and is not any better.  She is having chest congestion, cough, sore throat.  She says she needs an antibiotic to get better.  [facility]. Initial call taken by: Lowella Petties CMA,  December 06, 2008 12:26 PM  Follow-up for Phone Call        Patient notified as instructed by telephone.  Follow-up by: Lewanda Rife LPN,  December 06, 2008 3:38 PM    New/Updated Medications: MINOCIN 100 MG CAPS (MINOCYCLINE HCL) 1 two times a day for URI Prescriptions: MINOCIN 100 MG CAPS (MINOCYCLINE HCL) 1 two times a day for URI  #14 x 0   Entered and Authorized by:   Gildardo Griffes FNP   Signed by:   Gildardo Griffes FNP on 12/06/2008   Method used:   [facility] ([facility])       [address]       [place], [place]  [address]       Ph: [phone] or [phone]       Fax: [phone]   RxID:   [id number]

## 2010-03-26 NOTE — Assessment & Plan Note (Signed)
Summary: BACK PAIN / LFW   Vital Signs:  Patient profile:   70 year old female Height:      64 inches Weight:      153 pounds BMI:     26.36 Temp:     98 degrees F oral Pulse rate:   84 / minute Pulse rhythm:   regular BP sitting:   110 / 60  (left arm) Cuff size:   regular  Vitals Entered By: Liane Comber CMA (September 13, 2008 3:43 PM)  History of Present Illness: back is bothering her   3 weeks ago - had trouble with riding horse that was 'bucking'-- but did not fall  back started to hurt about a week later  has continued to clean horse stalls  better if active  is stiff after inactivity  pain is in low back- and sometimes moves up into middle back  started worse on L - now radiates to the middle  had some meloxicam from her foot doctor-- has helped a little 15 mg   sometimes at night - R leg will hurt -- and has to take an over the counter pm med (? ibuprofen)    vomited and had some diarrea yesterday-- is better today ( ate light today)  no urinary symptoms - no burning or frequency  still goes often at night - and this is not unusual for her   no fever or chills   because of heat- less active lately   Allergies: 1)  ! Penicillin 2)  ! Sulfa 3)  ! Codeine 4)  ! Demerol 5)  ! Darvon  Past History:  Past Medical History: Last updated: 04/30/2008 Hyperlipidemia Osteoporosis (could not remb to take actonel in past ) IC heel spurs IBS GERD  [facility]-- [facility] - [place] [facility]- [facility]  ENT- [facility]  Past Surgical History: Last updated: 03/17/2007 Right arm fracture Rotator cuff repair x 2 Hysterectomy- PID, 1 ovary left Dexa- osteopenia (08/2000) Upper GI- hiatal hernia (09/2000) Cholecystectomy (11/2000) Cardiolite- neg (12/2001) Colonoscopy- polyps (04/2002)  Family History: Last updated: 08/30/2007 Father: MI's, embolism, ? aneurysm, TB, CVA ETOH, HTN- died age 73, GERD Mother: ETOH, MI's- died age 84,  GERD Siblings: 1 sister with female cancer, 1 sister- suicide, depressed and in bad marriage  Social History: Last updated: 04/30/2008 Marital Status: Married (husb had aneurysm- survived ) Children: 4 Occupation: retired rides horses non smoker- but exp to 2nd hand smoke from husband  Risk Factors: Smoking Status: never (03/17/2007)  Review of Systems General:  Denies chills, fatigue, and fever. Eyes:  Denies blurring. CV:  Denies chest pain or discomfort and palpitations. Resp:  Denies cough and wheezing; had a little cough last week. GU:  Denies discharge, dysuria, hematuria, and urinary frequency. MS:  Complains of low back pain and stiffness; denies joint redness, joint swelling, cramps, and muscle weakness. Derm:  Denies itching, poor wound healing, and rash. Neuro:  Denies numbness, tingling, and weakness.  Physical Exam  General:  Well-developed,well-nourished,in no acute distress; alert,appropriate and cooperative throughout examination hygiene is fair Eyes:  vision grossly intact, pupils equal, pupils round, and pupils reactive to light.   Neck:  supple with full rom and no masses or thyromegally, no JVD or carotid bruit no bony tenderness Lungs:  Normal respiratory effort, chest expands symmetrically. Lungs are clear to auscultation, no crackles or wheezes. Heart:  Normal rate and regular rhythm. S1 and S2 normal without gallop, murmur, click, rub or other extra  sounds. Abdomen:  no suprapubic tenderness or fullness felt  Msk:  no Ts or LS tenderness kyphosis noted  some mild perilumbar tenderness/tightness on L and in piriformis area  neg SLR  nl rom hips / no troch tenderness nl gait  pain on L flex and fullext ( but full rom capable) no CVA tenderness  Extremities:  No clubbing, cyanosis, edema, or deformity noted with normal full range of motion of all joints.   Neurologic:  strength normal in all extremities, sensation intact to light touch, gait normal, and  DTRs symmetrical and normal.   Skin:  Intact without suspicious lesions or rashes Inguinal Nodes:  No significant adenopathy Psych:  nl affect today   Impression & Recommendations:  Problem # 1:  BACK PAIN (ICD-724.5) Assessment New  low back pain after strain without spinal tenderness or neurol change  adv heat and gentle stretching  handout given on back pain  mobic as needed - with food (watch for GI upset and do not mix with ibuprofen) flexeril to try at night  consider films if worse or not imp next week The following medications were removed from the medication list:    Ibuprofen 600 Mg Tabs (Ibuprofen) .Marland Kitchen... 1 by mouth with food up to three times a day as needed pain Her updated medication list for this problem includes:    Adult Aspirin Low Strength 81 Mg Tbdp (Aspirin) ..... One by mouth daily    Mobic 15 Mg Tabs (Meloxicam) .Marland Kitchen... 1 by mouth once daily as needed pain    Flexeril 10 Mg Tabs (Cyclobenzaprine hcl) .Marland Kitchen... 1/2 to 1 by mouth at bedtime as needed back pain (caution- will sedate)  Orders: UA Dipstick W/ Micro (manual) ([address])  Problem # 2:  URINARY FREQUENCY (ICD-788.41) Assessment: New ongoing with neg ua today  suspect rel to IC  enc good water intake  Orders: UA Dipstick W/ Micro (manual) ([address])  Complete Medication List: 1)  Omeprazole 20 Mg Cpdr (Omeprazole) .... 2 tabs by mouth at bedtime 2)  Adult Aspirin Low Strength 81 Mg Tbdp (Aspirin) .... One by mouth daily 3)  Calcium 1500 Mg Tabs (Calcium carbonate) .... Take by mouth as directed qd 4)  Glucosamine-chondroitin Caps (Glucosamine-chondroit-vit c-mn) .... Take 1 tablet by mouth once a day 5)  Fish Oil Oil (Fish oil) .... Take 1 tablet by mouth once a day 6)  D-mannose Powder  .... As needed for uti symptoms 7)  Mobic 15 Mg Tabs (Meloxicam) .Marland Kitchen.. 1 by mouth once daily as needed pain 8)  Flexeril 10 Mg Tabs (Cyclobenzaprine hcl) .... 1/2 to 1 by mouth at bedtime as needed back pain (caution- will  sedate)  Patient Instructions: 1)  use warm compress on back  2)  try to do gentle stretching 3)  you can continue meloxicam (mobic) - but take it on a full stomach  4)  flexeril is a muscle relaxer that may help at night  5)  if not improving next week- please call so I can order some x rays  Prescriptions: FLEXERIL 10 MG TABS (CYCLOBENZAPRINE HCL) 1/2 to 1 by mouth at bedtime as needed back pain (caution- will sedate)  #30 x 0   Entered and Authorized by:   Judith Part MD   Signed by:   Judith Part MD on 09/13/2008   Method used:   Print then Give to Patient   RxID:   [id number]   Prior Medications (reviewed today): OMEPRAZOLE 20 MG CPDR (OMEPRAZOLE)  2 tabs by mouth at bedtime ADULT ASPIRIN LOW STRENGTH 81 MG  TBDP (ASPIRIN) one by mouth daily CALCIUM 1500 MG  TABS (CALCIUM CARBONATE) take by mouth as directed qd GLUCOSAMINE-CHONDROITIN   CAPS (GLUCOSAMINE-CHONDROIT-VIT C-MN) Take 1 tablet by mouth once a day FISH OIL   OIL (FISH OIL) Take 1 tablet by mouth once a day D-MANNOSE POWDER () as needed for UTI symptoms Current Allergies (reviewed today): ! PENICILLIN ! SULFA ! CODEINE ! DEMEROL ! DARVON Current Medications (including changes made in today's visit):  OMEPRAZOLE 20 MG CPDR (OMEPRAZOLE) 2 tabs by mouth at bedtime ADULT ASPIRIN LOW STRENGTH 81 MG  TBDP (ASPIRIN) one by mouth daily CALCIUM 1500 MG  TABS (CALCIUM CARBONATE) take by mouth as directed qd GLUCOSAMINE-CHONDROITIN   CAPS (GLUCOSAMINE-CHONDROIT-VIT C-MN) Take 1 tablet by mouth once a day FISH OIL   OIL (FISH OIL) Take 1 tablet by mouth once a day * D-MANNOSE POWDER as needed for UTI symptoms MOBIC 15 MG TABS (MELOXICAM) 1 by mouth once daily as needed pain FLEXERIL 10 MG TABS (CYCLOBENZAPRINE HCL) 1/2 to 1 by mouth at bedtime as needed back pain (caution- will sedate)   Laboratory Results   Urine Tests  Date/Time Received: September 13, 2008 4:05 PM Date/Time Reported: September 13, 2008 4:05  PM  Routine Urinalysis   Color: yellow Appearance: Clear Glucose: negative   (Normal Range: Negative) Bilirubin: negative   (Normal Range: Negative) Ketone: negative   (Normal Range: Negative) Spec. Gravity: 1.010   (Normal Range: 1.003-1.035) Blood: negative   (Normal Range: Negative) pH: 7.0   (Normal Range: 5.0-8.0) Protein: negative   (Normal Range: Negative) Urobilinogen: 0.2   (Normal Range: 0-1) Nitrite: negative   (Normal Range: Negative) Leukocyte Esterace: negative   (Normal Range: Negative)  Urine Microscopic WBC/HPF: 0 RBC/HPF: 0 Bacteria/HPF: 0 Mucous/HPF: some Epithelial/HPF: 1-3 Crystals/HPF: 0 Casts/LPF: 0 Yeast/HPF: 0 Other: 0

## 2010-03-26 NOTE — Letter (Signed)
Summary: Results Follow up Letter  [place] [facility]  [address] [place], [place] [address]   Phone: [phone]  Fax: [phone]    08/18/2009 MRN: [id number]    [name] [address] [place], [place]  [address]    Dear Ms. Tax,  The following are the results of your recent test(s):  Test         Result    Pap Smear:        Normal _____  Not Normal _____ Comments: ______________________________________________________ Cholesterol: LDL(Bad cholesterol):         Your goal is less than:         HDL (Good cholesterol):       Your goal is more than: Comments:  ______________________________________________________ Mammogram:        Normal ___X__  Not Normal _____ Comments:  Yearly follow up is recommended.   ___________________________________________________________________ Hemoccult:        Normal _____  Not normal _______ Comments:    _____________________________________________________________________ Other Tests:    We routinely do not discuss normal results over the telephone.  If you desire a copy of the results, or you have any questions about this information we can discuss them at your next office visit.   Sincerely,    Marne A. Milinda Antis, M.D.  MAT:lsf

## 2010-03-31 ENCOUNTER — Encounter: Payer: Self-pay | Admitting: Family Medicine

## 2010-03-31 ENCOUNTER — Encounter (INDEPENDENT_AMBULATORY_CARE_PROVIDER_SITE_OTHER): Payer: Self-pay | Admitting: *Deleted

## 2010-03-31 ENCOUNTER — Other Ambulatory Visit (INDEPENDENT_AMBULATORY_CARE_PROVIDER_SITE_OTHER): Payer: MEDICARE

## 2010-03-31 DIAGNOSIS — E559 Vitamin D deficiency, unspecified: Secondary | ICD-10-CM

## 2010-04-03 LAB — CONVERTED CEMR LAB: Vit D, 25-Hydroxy: 36 ng/mL (ref 30–89)

## 2010-06-11 ENCOUNTER — Ambulatory Visit (INDEPENDENT_AMBULATORY_CARE_PROVIDER_SITE_OTHER): Payer: MEDICARE | Admitting: Family Medicine

## 2010-06-11 DIAGNOSIS — Z23 Encounter for immunization: Secondary | ICD-10-CM

## 2010-06-11 DIAGNOSIS — Z2911 Encounter for prophylactic immunotherapy for respiratory syncytial virus (RSV): Secondary | ICD-10-CM

## 2010-06-11 NOTE — Progress Notes (Signed)
  Subjective:    Patient ID: [name], female    DOB: 1940/12/23, 70 y.o.   MRN: [id number]  HPI    Review of Systems     Objective:   Physical Exam        Assessment & Plan:  rn visit.

## 2010-07-10 NOTE — Op Note (Signed)
[facility]  Patient:    [name], HELD Visit Number: [id number] MRN: [id number]          Service Type: DSU Location: DAY Attending Physician:  Shelly Rubenstein Dictated by:   Abigail Miyamoto, M.D. Proc. Date: 11/14/00 Admit Date:  11/14/2000                             Operative Report  PREOPERATIVE DIAGNOSIS:  Symptomatic cholelithiasis.  POSTOPERATIVE DIAGNOSIS:  Symptomatic cholelithiasis.  OPERATION PERFORMED:  Laparoscopic cholecystectomy.  SURGEON:  Abigail Miyamoto, M.D.  ASSISTANT:  Rose Phi. Maple Hudson, M.D.  ANESTHESIA:  General endotracheal and 0.25% Marcaine plain.  ESTIMATED BLOOD LOSS:  Minimal.  DESCRIPTION OF PROCEDURE:  The patient was brought to the operating room and identified as [place].  She was places supine on the operating room and general anesthesia was induced.  Her abdomen was then prepped and draped in the usual sterile fashion.  Using a #15 blade, a small vertical incision was made below the umbilicus.  The incision was carried down through the fascia which was then opened with a scalpel.  A hemostat was then used to pass into the peritoneal cavity.  A 0 Vicryl pursestring suture was placed around the fascial opening.  The Hasson port was then placed through the opening and insufflation of the abdomen was begun.  A 11 mm port was placed in the patients epigastrium,  two 5 mm ports were placed in the patients right flank under direct vision.  The gallbladder was then identified and grasped and retracted above the liver bed.  Dissection was then carried out at the base of the gallbladder.  The cystic duct was dissected out, clipped three times proximally, once distally and transected with scissors.  The cystic artery was then identified and clipped twice proximally and once distally and transected as well.  The gallbladder was then dissected free from the liver bed with electrocautery.  During  dissection, the gallbladder was entered. Once the gallbladder was completely excised, it was placed in an Endosac and removed through the port of the umbilicus.  Again hemostasis was felt to be achieved.  The abdomen was then irrigated with normal saline.  All ports were then removed under direct vision and the abdomen was deflated.  The 0 Vicryl at the umbilicus was tied in place closing the fascial defect.  All incisions were then anesthetized with 0.25% Marcaine and closed with 4-0 Monocryl subcuticular sutures.  Steri-Strips, gauze and Tegaderm were then applied. The patient tolerated the procedure well.  Sponge, needle and instrument counts were correct at the end of the procedure.  The patient was then extubated in the operating room and taken in stable condition to the recovery room. Dictated by:   Abigail Miyamoto, M.D. Attending Physician:  Shelly Rubenstein DD:  11/14/00 TD:  11/14/00 Job: [id number]

## 2010-07-10 NOTE — Op Note (Signed)
[facility]  Patient:    [name], [name]                  MRN: [id number] Proc. Date: 03/08/00 Adm. Date:  [id number] Attending:  Twana First                           Operative Report  PREOPERATIVE DIAGNOSIS: Left shoulder rotator cuff tear and impingement.  POSTOPERATIVE DIAGNOSES:  1. Left shoulder partial rotator cuff tear.  2. Left shoulder impingement.  OPERATION/PROCEDURE:  1. Left shoulder examined under anesthesia followed by arthroscopic partial     rotator cuff tear debridement.  2. Left shoulder subacromial decompression.  SURGEON: Elana Alm. Thurston Hole, M.D.  ASSISTANT: Kirstin A. Shepperson, P.A.  ANESTHESIA: General.  OPERATIVE TIME: Operative time was 45 minutes.  COMPLICATIONS: None.  INDICATIONS FOR PROCEDURE: [name] is a 70 year old woman who has had three to four months of increasing left shoulder pain, with signs and symptoms and MRI documenting a partial versus complete rotator cuff tear, who has failed conservative care and is now to undergo arthroscopy.  DESCRIPTION OF PROCEDURE: [name] was brought to the operating room on March 08, 2000 and placed on the operating room table in the supine position, after supraclavicular block had been done in the holding room.  She was placed on the operating room table in the supine position and after an adequate level of general anesthesia was obtained her left shoulder was examined under anesthesia.  She had full range of motion and her shoulder was stable to ligamentous examination.  She was then placed in a beach chair position and her shoulder and arm prepped using sterile Betadine and draped using sterile technique.  Initially the arthroscopy was performed through a posterior arthroscopic portal.  The arthroscope with a pump attached with placed and through an anterior portal an arthroscopic probe was placed.  On initial inspection the articular  cartilage in the glenohumeral joint showed 25-30% grade 3 chondromalacia on the humeral head, which was debrided.  The rest of the articular cartilage was intact.  Anterior and posterior labrum was intact.  Superior labrum and biceps tendon anchor was intact.  Biceps tendon was intact.  The inferior labrum and anterior and inferior glenohumeral ligaments were intact.  The rotator cuff was thoroughly inspected.  She had a partial 20% undersurface tear of the supraspinatus, which was debrided.  The rest of the rotator cuff was found to be intact.  The inferior capsular recess was free of pathology.  The subacromial space was entered and a lateral arthroscopic portal was made.  Moderately thickened bursitis was resected. Underneath this the rotator cuff was inflamed and frayed, but no definite tear was noted.  This was partially debrided.  Subacromial decompression was carried out, removing 6-8 mm of the undersurface of the anterior, anterolateral, and anteromedial acromion.  The CA ligament release was also carried out with cautery.  AFter this was done no further pathology was noted and the shoulder could be brought through full range of motion with no impingement on the rotator cuff.  At this point the instruments were removed. Portals were closed 3-0 Nylon suture and injected with 0.25% Marcaine with epinephrine.  Sterile dressings and a sling were applied.  The patient was awakened and taken to the recovery room in stable condition.  FOLLOW-UP CARE: [name] will be followed as an outpatient, on Vicodin for pain.  She will  see me back in the office in a week for suture removal and follow-up. DD:  03/08/00 TD:  03/08/00 Job: [id number]

## 2010-08-23 HISTORY — PX: CARDIAC CATHETERIZATION: SHX172

## 2010-09-11 ENCOUNTER — Inpatient Hospital Stay (HOSPITAL_COMMUNITY)
Admission: EM | Admit: 2010-09-11 | Discharge: 2010-09-14 | DRG: 287 | Disposition: A | Discharge status: Home / Self Care | Payer: Medicare Other | Attending: Cardiology | Admitting: Cardiology

## 2010-09-11 ENCOUNTER — Observation Stay (HOSPITAL_COMMUNITY): Payer: Medicare Other

## 2010-09-11 ENCOUNTER — Emergency Department (HOSPITAL_COMMUNITY): Payer: Medicare Other

## 2010-09-11 DIAGNOSIS — E559 Vitamin D deficiency, unspecified: Secondary | ICD-10-CM | POA: Diagnosis present

## 2010-09-11 DIAGNOSIS — R911 Solitary pulmonary nodule: Secondary | ICD-10-CM | POA: Diagnosis present

## 2010-09-11 DIAGNOSIS — K219 Gastro-esophageal reflux disease without esophagitis: Secondary | ICD-10-CM | POA: Diagnosis present

## 2010-09-11 DIAGNOSIS — Z882 Allergy status to sulfonamides status: Secondary | ICD-10-CM

## 2010-09-11 DIAGNOSIS — M810 Age-related osteoporosis without current pathological fracture: Secondary | ICD-10-CM | POA: Diagnosis present

## 2010-09-11 DIAGNOSIS — R079 Chest pain, unspecified: Secondary | ICD-10-CM

## 2010-09-11 DIAGNOSIS — Z880 Allergy status to penicillin: Secondary | ICD-10-CM

## 2010-09-11 DIAGNOSIS — Z888 Allergy status to other drugs, medicaments and biological substances status: Secondary | ICD-10-CM

## 2010-09-11 DIAGNOSIS — K589 Irritable bowel syndrome without diarrhea: Secondary | ICD-10-CM | POA: Diagnosis present

## 2010-09-11 DIAGNOSIS — Z7982 Long term (current) use of aspirin: Secondary | ICD-10-CM

## 2010-09-11 DIAGNOSIS — E7800 Pure hypercholesterolemia, unspecified: Secondary | ICD-10-CM | POA: Diagnosis present

## 2010-09-11 DIAGNOSIS — R7309 Other abnormal glucose: Secondary | ICD-10-CM | POA: Diagnosis present

## 2010-09-11 DIAGNOSIS — R0789 Other chest pain: Principal | ICD-10-CM | POA: Diagnosis present

## 2010-09-11 DIAGNOSIS — E785 Hyperlipidemia, unspecified: Secondary | ICD-10-CM | POA: Diagnosis present

## 2010-09-11 DIAGNOSIS — R9430 Abnormal result of cardiovascular function study, unspecified: Secondary | ICD-10-CM

## 2010-09-11 DIAGNOSIS — I2510 Atherosclerotic heart disease of native coronary artery without angina pectoris: Secondary | ICD-10-CM | POA: Diagnosis present

## 2010-09-11 LAB — BASIC METABOLIC PANEL
BUN: 30 mg/dL — ABNORMAL HIGH (ref 6–23)
CO2: 25 mEq/L (ref 19–32)
Calcium: 8.9 mg/dL (ref 8.4–10.5)
Chloride: 105 mEq/L (ref 96–112)
Creatinine, Ser: 0.75 mg/dL (ref 0.50–1.10)
GFR calc Af Amer: >60 mL/min (ref >60)
GFR calc non Af Amer: >60 mL/min (ref >60)
Glucose, Bld: 101 mg/dL — ABNORMAL HIGH (ref 70–99)
Potassium: 3.9 mEq/L (ref 3.5–5.1)
Sodium: 138 mEq/L (ref 135–145)

## 2010-09-11 LAB — DIFFERENTIAL
Basophils Absolute: 0 10*3/uL (ref 0.0–0.1)
Basophils Relative: 1 % (ref 0–1)
Eosinophils Absolute: 0.1 10*3/uL (ref 0.0–0.7)
Eosinophils Relative: 2 % (ref 0–5)
Lymphocytes Relative: 30 % (ref 12–46)
Lymphs Abs: 2.2 10*3/uL (ref 0.7–4.0)
Monocytes Absolute: 0.8 10*3/uL (ref 0.1–1.0)
Monocytes Relative: 10 % (ref 3–12)
Neutro Abs: 4.1 10*3/uL (ref 1.7–7.7)
Neutrophils Relative %: 57 % (ref 43–77)

## 2010-09-11 LAB — TROPONIN I
Troponin I: <0.3 ng/mL (ref <0.30)
Troponin I: <0.3 ng/mL (ref <0.30)
Troponin I: <0.3 ng/mL (ref <0.30)

## 2010-09-11 LAB — CBC
HCT: 35.6 % — ABNORMAL LOW (ref 36.0–46.0)
Hemoglobin: 12 g/dL (ref 12.0–15.0)
MCH: 30 pg (ref 26.0–34.0)
MCHC: 33.7 g/dL (ref 30.0–36.0)
MCV: 89 fL (ref 78.0–100.0)
Platelets: 251 10*3/uL (ref 150–400)
RBC: 4 MIL/uL (ref 3.87–5.11)
RDW: 13.3 % (ref 11.5–15.5)
WBC: 7.2 10*3/uL (ref 4.0–10.5)

## 2010-09-11 LAB — CK TOTAL AND CKMB (NOT AT ARMC)
CK, MB: 5 ng/mL — ABNORMAL HIGH (ref 0.3–4.0)
Relative Index: 3.8 — ABNORMAL HIGH (ref 0.0–2.5)
Total CK: 130 U/L (ref 7–177)

## 2010-09-11 MED ORDER — IOHEXOL 350 MG/ML SOLN: 80.0000 mL | Freq: Once | INTRAVENOUS | Status: AC | PRN

## 2010-09-12 LAB — LIPID PANEL
Cholesterol: 252 mg/dL — ABNORMAL HIGH (ref 0–200)
HDL: 85 mg/dL (ref >39)
LDL Cholesterol: 149 mg/dL — ABNORMAL HIGH (ref 0–99)
Total CHOL/HDL Ratio: 3 RATIO
Triglycerides: 90 mg/dL (ref <150)
VLDL: 18 mg/dL (ref 0–40)

## 2010-09-12 LAB — CBC
HCT: 37.8 % (ref 36.0–46.0)
Hemoglobin: 12.8 g/dL (ref 12.0–15.0)
MCH: 29.9 pg (ref 26.0–34.0)
MCHC: 33.9 g/dL (ref 30.0–36.0)
MCV: 88.3 fL (ref 78.0–100.0)
Platelets: 261 10*3/uL (ref 150–400)
RBC: 4.28 MIL/uL (ref 3.87–5.11)
RDW: 13.4 % (ref 11.5–15.5)
WBC: 5.7 10*3/uL (ref 4.0–10.5)

## 2010-09-12 LAB — HEPARIN LEVEL (UNFRACTIONATED)
Heparin Unfractionated: 0.33 IU/mL (ref 0.30–0.70)
Heparin Unfractionated: <0.1 IU/mL — ABNORMAL LOW (ref 0.30–0.70)
Heparin Unfractionated: <0.1 IU/mL — ABNORMAL LOW (ref 0.30–0.70)

## 2010-09-12 LAB — CARDIAC PANEL(CRET KIN+CKTOT+MB+TROPI)
CK, MB: 5.7 ng/mL — ABNORMAL HIGH (ref 0.3–4.0)
Relative Index: 4.7 — ABNORMAL HIGH (ref 0.0–2.5)
Total CK: 121 U/L (ref 7–177)
Troponin I: <0.3 ng/mL (ref <0.30)

## 2010-09-12 LAB — TSH: TSH: 2.695 u[IU]/mL (ref 0.350–4.500)

## 2010-09-13 LAB — BASIC METABOLIC PANEL
BUN: 18 mg/dL (ref 6–23)
CO2: 27 mEq/L (ref 19–32)
Calcium: 9.2 mg/dL (ref 8.4–10.5)
Chloride: 105 mEq/L (ref 96–112)
Creatinine, Ser: 0.89 mg/dL (ref 0.50–1.10)
GFR calc Af Amer: >60 mL/min (ref >60)
GFR calc non Af Amer: >60 mL/min (ref >60)
Glucose, Bld: 102 mg/dL — ABNORMAL HIGH (ref 70–99)
Potassium: 4 mEq/L (ref 3.5–5.1)
Sodium: 139 mEq/L (ref 135–145)

## 2010-09-13 LAB — HEPARIN LEVEL (UNFRACTIONATED): Heparin Unfractionated: 0.62 IU/mL (ref 0.30–0.70)

## 2010-09-13 LAB — PROTIME-INR
INR: 1.03 (ref 0.00–1.49)
Prothrombin Time: 13.7 seconds (ref 11.6–15.2)

## 2010-09-14 LAB — BASIC METABOLIC PANEL
BUN: 16 mg/dL (ref 6–23)
CO2: 26 mEq/L (ref 19–32)
Calcium: 9.1 mg/dL (ref 8.4–10.5)
Chloride: 107 mEq/L (ref 96–112)
Creatinine, Ser: 0.86 mg/dL (ref 0.50–1.10)
GFR calc Af Amer: >60 mL/min (ref >60)
GFR calc non Af Amer: >60 mL/min (ref >60)
Glucose, Bld: 100 mg/dL — ABNORMAL HIGH (ref 70–99)
Potassium: 4.1 mEq/L (ref 3.5–5.1)
Sodium: 141 mEq/L (ref 135–145)

## 2010-09-14 LAB — CBC
HCT: 36.2 % (ref 36.0–46.0)
Hemoglobin: 12 g/dL (ref 12.0–15.0)
MCH: 29.6 pg (ref 26.0–34.0)
MCHC: 33.1 g/dL (ref 30.0–36.0)
MCV: 89.4 fL (ref 78.0–100.0)
Platelets: 280 10*3/uL (ref 150–400)
RBC: 4.05 MIL/uL (ref 3.87–5.11)
RDW: 13.2 % (ref 11.5–15.5)
WBC: 6 10*3/uL (ref 4.0–10.5)

## 2010-09-14 LAB — HEPARIN LEVEL (UNFRACTIONATED): Heparin Unfractionated: 1.69 IU/mL — ABNORMAL HIGH (ref 0.30–0.70)

## 2010-09-18 NOTE — H&P (Signed)
NAMEGWEN, [name]           ACCOUNT NO.:  [id number]  MEDICAL RECORD NO.:  [id number]  LOCATION:  2008                         FACILITY:  [facility]  PHYSICIAN:  Rollene Rotunda, MD, FACCDATE OF BIRTH:  1940/03/25  DATE OF ADMISSION:  09/11/2010 DATE OF DISCHARGE:                             HISTORY & PHYSICAL   PRIMARY CARE PHYSICIAN:  Marne A. Tower, MD  CARDIOLOGIST:  None.  REASON FOR PRESENTATION:  The patient with chest pain and an abnormal coronary CT angiogram.  HISTORY OF PRESENT ILLNESS:  The patient is a 70 year old white female who has had some vague palpitations in the past and apparently saw Dr. Patty Sermons.  She has never had any coronary disease.  However, she last night had some discomfort while asleep.  She had an uncomfortable evening as she might often have with some back discomfort.  However, she developed some substernal chest pressure.  This radiated through to her back.  It was 6/10 in intensity and lasts for greater than 30 minutes. It came on spontaneously and went away spontaneously.  She felt little uncomfortable taking a deep breath.  She might have had some jaw discomfort though she did not have any arm discomfort.  She did not have any new nausea, vomiting, or diaphoresis.  She did not have any new shortness of breath, PND, or orthopnea.  She has had no palpitations, presyncope, or syncope.  She came to the emergency room via EMS.  She has had some mild residual chest discomfort that she describes now but was not describing this earlier.  She did rule out with negative enzymes.  She had a CT angiogram which demonstrated calcium that puts her in the 54th percentile.  There is a greater than 50% LAD stenosis, though it could not be quantified further.  There was also an questionable lung nodule.  The patient had otherwise been feeling relatively well.  She was able to clean out the stalls for one of her horses yesterday without bringing on any  discomfort.  She does get short of breath occasionally but this has been a stable pattern.  She has had no significant decrease in exercise tolerance.  She has multiple somatic complaints.  PAST MEDICAL HISTORY:  Hyperlipidemia, osteoporosis, irritable bowel syndrome, interstitial cystitis, gastroesophageal reflux disease, hyperglycemia, heel spurs, vitamin D deficiency.  PAST SURGICAL HISTORY:  Right and left shoulder surgery, cholecystectomy, right knee arthroscopic surgery.  ALLERGIES:  CODEINE, MEPERIDINE, PENICILLIN, PROPOXYPHENE, SULFA, and TRAMADOL.  MEDICATIONS:  None.  SOCIAL HISTORY:  The patient is married.  She has had 4 living children. She has never smoked cigarettes.  FAMILY HISTORY:  Contributory for her mother having coronary disease in her 31s.  She had a CVA and died at age 16.  REVIEW OF SYSTEMS:  As stated in the HPI and positive for some nausea. Otherwise negative for all other systems.  PHYSICAL EXAMINATION:  GENERAL:  The patient is in no distress. VITAL SIGNS:  Blood pressure 143/69, heart rate 55 and regular, afebrile, respiratory rate 20. HEENT:  Eyelids are unremarkable.  Pupils equal, round, reactive to light, fundi not visualized, oral mucosa unremarkable. NECK:  No jugular distention of 45 degrees, carotid upstroke brisk and symmetrical.  No bruits, no thyromegaly. LYMPHATICS:  No cervical, axillary, or inguinal adenopathy. LUNGS:  Clear to auscultation bilaterally. BACK:  No costovertebral tenderness. CHEST:  Unremarkable. HEART:  PMI not displaced or sustained, S1 and S2 within normal limits. No S3, S4, no clicks, no rubs, no murmurs. ABDOMEN:  Flat, positive bowel sounds, normal in frequency and pitch. No bruits, rebound, guarding, or midline pulsatile mass.  No hepatomegaly, no splenomegaly. SKIN:  No rashes, no nodules. EXTREMITIES:  2+ pulses throughout, no edema, no cyanosis, no clubbing. NEURO:  Oriented to person, place, and time.   Cranial nerves II-XII grossly intact, motor grossly intact.  EKG:  Sinus rhythm, rate 62, axis within normal limits, intervals within normal limits, no acute ST-wave changes.  LABORATORY DATA:  Sodium 138, potassium 3.9, BUN 30, creatinine 0.75, WBC 7.2, hemoglobin 12, platelets 251,000.  Coronary CT as described.  ASSESSMENT/PLAN: 1. Chest discomfort.  The patient has chest comfort with abnormal     coronary calcium score and obvious obstruction that cannot be     adequately quantified.  She does have some vague ongoing discomfort     and risk factors.  Given this she will be admitted.  I will treat     her with heparin, nitroglycerin paste, and aspirin.  I will try a     low-dose beta-blocker.  She will need cardiac catheterization.  I     have discussed with her extensively the risks and benefits and she     agrees to proceed. 2. Hypertension.  Blood pressure is slightly elevated in the ER and I     will watch this and treat as above with ACE inhibitors if needed. 3. Risk reduction.  We will check a lipid profile and treat     accordingly. 4. Lung nodule.  The recommendations are to follow this in 1 year asshe is not high risk for bronchogenic carcinoma.  She would need a     CT in 1 year and we will need to notify her primary care for     physician.     Rollene Rotunda, MD, [facility]     JH/MEDQ  D:  09/11/2010  T:  09/12/2010  Job:  [id number]  Electronically Signed by Rollene Rotunda MD [facility] on 09/18/2010 07:51:43 PM

## 2010-09-18 NOTE — Cardiovascular Report (Signed)
  [name], [name] NO.:  [id number]  MEDICAL RECORD NO.:  [id number]  LOCATION:  2807                         FACILITY:  [facility]  PHYSICIAN:  [name], M.D.  DATE OF BIRTH:  01/09/1941  DATE OF PROCEDURE: DATE OF DISCHARGE:                           CARDIAC CATHETERIZATION   INDICATION FOR PROCEDURE:  The patient is a 70 year old white female with a history of hyperlipidemia, who presents with symptoms of chest pain.  She had a CT angiogram which indicated a moderate LAD stenosis. Coronary angiography was recommended to further quantitate.  PROCEDURES:  Left heart catheterization, coronary and left ventricular angiography, access is via the right radial artery using the standard Seldinger technique.  EQUIPMENTS:  A 5-French 4-cm right Judkins catheter, 5-French 3.5-cm left Judkins catheter, 5-French pigtail catheter, 5-French arterial sheath.  MEDICATIONS: 1. Local anesthesia 1% Xylocaine. 2. Versed 2 mg IV. 3. Verapamil 3 mg intra-arterial. 4. Heparin 3000 units IV.  CONTRAST:  Omnipaque 80 mL.  HEMODYNAMIC DATA:  Aortic pressure is 133/64 with a mean of 89 mmHg. Left ventricle pressure is 135 with an EDP of 10 mmHg.  ANGIOGRAPHIC DATA:  The right coronary artery arises and distributes in a normal dominant fashion.  It is normal.  The left main coronary artery is normal.  The left anterior descending artery has eccentric calcified plaque in the proximal vessel with less than 20% irregularity.  There is also 20% narrowing in the midvessel.  The left circumflex coronary artery has 10% irregularities of the ostium.  It is otherwise normal.  Left ventricular angiography performed in the RAO view demonstrates normal left ventricular size and contractility with normal systolic function.  Ejection fraction is estimated at 60%.  FINAL INTERPRETATION: 1. 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 2. Normal left  ventricular function.  PLAN:  We would recommend continued risk factor modification.          ______________________________ [name], M.D.     PMJ/MEDQ  D:  09/14/2010  T:  09/14/2010  Job:  [id number]  cc:   Yvonne Rotunda, MD, [facility] Yvonne A. Milinda Antis, MD  Electronically Signed by [name] M.D. on 09/18/2010 08:50:36 AM

## 2010-09-23 ENCOUNTER — Encounter: Payer: Self-pay | Admitting: *Deleted

## 2010-10-02 NOTE — Discharge Summary (Signed)
NAMEDILLON, MCREYNOLDS NO.:  [id number]  MEDICAL RECORD NO.:  [id number]  LOCATION:  2807                         FACILITY:  [facility]  PHYSICIAN:  Solana Coggin M. [place], M.D.  DATE OF BIRTH:  20-Jan-1941  DATE OF ADMISSION:  09/11/2010 DATE OF DISCHARGE:  09/14/2010                              DISCHARGE SUMMARY   PRIMARY CARDIOLOGIST:  Cassell Clement, MD  PRIMARY CARE PROVIDER:  Audrie Gallus. Tower, MD  DISCHARGE DIAGNOSIS:  Chest pain without objective evidence of ischemia.  SECONDARY DIAGNOSES: 1. Hyperlipidemia. 2. Nonobstructive coronary artery disease by catheterization this     admission. 3. Osteoporosis. 4. Irritable bowel syndrome. 5. History of interstitial cystitis. 6. Gastroesophageal reflux disease. 7. Hyperglycemia. 8. History of heel spur. 9. Vitamin D deficiency. 10.Status post bilateral shoulder surgery. 11.Status post cholecystectomy. 12.Status post right knee arthroscopy.ALLERGIES:  PENICILLIN, CODEINE, DEMEROL, PROPOXYPHENE, TRAMADOL, FENTANYL, SUBUTEX.  PROCEDURES: 1. Cardiac CT angiography on September 11, 2010 suggesting moderate     obstructive disease with a total coronary artery calcium score of     95.5.  Calcified plaque within the proximal LAD, for which, 50% or     slightly grade stenosis could not be excluded.  There was right-     sided coronary artery dominant and an area of mild LAD bridging.     There were lung nodules noted up to 6 mm with recommendations for 6-     12 months CT followup. 2. Diagnostic cardiac catheterization performed on September 14, 2010,     revealing nonobstructive CAD including a 20% proximal and mid LAD     stenosis which was eccentric and calcified.  EF was 60%.  Medical     therapy recommended.  HISTORY OF PRESENT ILLNESS:  A 70 year old female without prior history of coronary artery disease who was in her usual state of health until the evening of September 11, 2010, when she noted substernal chest  discomfort radiating through to her back lasting approximately 3 minutes and resolving spontaneously.  She presents to the [facility] ED where enzymes were negative and ECG showed no acute changes.  She underwent a cardiac CT angio in the ER, which suggested a greater than 50% LAD stenosis and therefore Cardiology was called for admission.  HOSPITAL COURSE:  The patient ruled out for MI.  She had no recurrence of chest pain over the weekend and was otherwise stable.  Statin was added to her regimen given an LDL of 149.  She underwent diagnostic catheterization this morning showing nonobstructive disease as outlined above with normal LV function, risk factor modification and medical therapy was recommended.  Postcatheterization, the patient had been ambulating without symptoms of limitations, but discharged home today in good condition.  DISCHARGE LABS:  Hemoglobin 12.0, hematocrit 36.2, WBC 6.0, platelets 280.  Sodium 141, potassium 4.1, chloride 107, CO2 26, BUN 16, creatinine 0.86, glucose 100, calcium 9.1.  CK 121, MB 5.7, troponin-I less than 0.30.  Total cholesterol 252, triglycerides 90, HDL 85, LDL 149.  TSH 2.695.  DISPOSITION:  The patient will be discharged home today in good condition.  FOLLOWUP PLANS AND APPOINTMENTS:  The patient will follow with Dr. Milinda Antis in the next 1-2 weeks.  DISCHARGE MEDICATIONS: 1. Lipitor 40 mg nightly. 2. Aspirin 81 mg daily. 3. Calcium citrate one tablet daily p.r.n. 4. Ibuprofen PM one tablet nightly p.r.n. 5. Vitamin D one tablet daily p.r.n.  OUTSTANDING LAB STUDIES:  The patient will require follow up lipids and LFTs in approximately 6-8 weeks given initiation of statin therapy.  DURATION DISCHARGE ENCOUNTERED:  Forty five minutes including physician time.     Nicolasa Ducking, ANP   ______________________________ Aki Burdin M. [place], M.D.    CB/MEDQ  D:  09/14/2010  T:  09/14/2010  Job:  [id number]  cc:   Marne A. Milinda Antis,  MD  Electronically Signed by Nicolasa Ducking ANP on 09/22/2010 07:23:20 PM Electronically Signed by Jailynne Opperman [place] M.D. on 10/02/2010 08:41:17 AM

## 2010-10-16 ENCOUNTER — Encounter: Payer: Self-pay | Admitting: Family Medicine

## 2010-10-16 ENCOUNTER — Ambulatory Visit (INDEPENDENT_AMBULATORY_CARE_PROVIDER_SITE_OTHER): Payer: Medicare Other | Admitting: Family Medicine

## 2010-10-16 VITALS — BP 124/64 | HR 68 | Temp 98.1°F | Ht 64.0 in | Wt 165.2 lb

## 2010-10-16 DIAGNOSIS — E785 Hyperlipidemia, unspecified: Secondary | ICD-10-CM

## 2010-10-16 DIAGNOSIS — L84 Corns and callosities: Secondary | ICD-10-CM | POA: Insufficient documentation

## 2010-10-16 DIAGNOSIS — I2510 Atherosclerotic heart disease of native coronary artery without angina pectoris: Secondary | ICD-10-CM

## 2010-10-16 NOTE — Patient Instructions (Signed)
I'm glad you are doing better Continue low dose aspirin and also lipitor Try to get more exercise - aim for at least 30 minutes 5 days per week  Keep foot clean- let me know if any problems  Schedule fasting lab in 3 weeks for cholesterol Avoid red meat/ fried foods/ egg yolks/ fatty breakfast meats/ butter, cheese and high fat dairy/ and shellfish  -- this is VERY important  Follow up with me in about 6 months

## 2010-10-16 NOTE — Progress Notes (Signed)
Subjective:    Patient ID: [name], female    DOB: 03/15/1940, 70 y.o.   MRN: [id number]  HPI  Here for f/u hosp and cardiac cath for cp July  Neg enzymes  Then cath showed nonobst dz- no angioplasty or other intervention Put on statin for LDL 149 On lipitor - doing ok with that   No further cp or other symptoms  No sob on exertion  No palpitations / sweating or nausea   Saw Dr Antoine Poche   Her bowels are moving better since she was hosp  Is eating more healthy since that   Had only one more episode of cp  Also has corn on bottom of R foot-needs attention   Not watching diet closely enough   Patient Active Problem List  Diagnoses  . COLONIC POLYPS  . UNSPECIFIED VITAMIN D DEFICIENCY  . HYPERLIPIDEMIA  . ALLERGIC RHINITIS  . GERD  . IBS  . INTERSTITIAL CYSTITIS  . HAND PAIN, BILATERAL  . CRAMP OF LIMB  . OSTEOPOROSIS  . HYPERGLYCEMIA  . INSOMNIA  . Callus of foot  . Coronary artery disease, non-occlusive   Past Medical History  Diagnosis Date  . Hyperlipidemia   . Osteoporosis   . IC (interstitial cystitis)   . Heel spur   . IBS (irritable bowel syndrome)   . GERD (gastroesophageal reflux disease)   . Hyperglycemia   . Vitamin D deficiency   . Insomnia disorder related to known organic factor    Past Surgical History  Procedure Date  . Rotator cuff repair     2 TIMES  . Abdominal hysterectomy   . Cholecystectomy    History  Substance Use Topics  . Smoking status: Never Smoker   . Smokeless tobacco: Not on file  . Alcohol Use: Not on file   Family History  Problem Relation Age of Onset  . Alcohol abuse Mother   . Heart attack Mother   . GER disease Mother   . GER disease Father   . Alcohol abuse Father   . Stroke Father   . Tuberculosis Father   . Heart attack Father   . Aneurysm Father   . Cancer Sister     FEMALE  . Mental illness Sister   . Depression Sister    Allergies  Allergen Reactions  . Codeine     REACTION:  nausea  . Meperidine Hcl     REACTION: nausea  . Penicillins     REACTION: hives  . Propoxyphene Hcl     REACTION: nausea  . Sulfonamide Derivatives     REACTION: hives  . Tramadol Hcl     REACTION: nausea and vomiting and felt like floating   No current outpatient prescriptions on file prior to visit.       Review of Systems Review of Systems  Constitutional: Negative for fever, appetite change, fatigue and unexpected weight change.  Eyes: Negative for pain and visual disturbance.  Respiratory: Negative for cough and shortness of breath.   Cardiovascular: Negative.for cp or sob or palpitations   Gastrointestinal: Negative for nausea, diarrhea and constipation.  Genitourinary: Negative for urgency and frequency.  Skin: Negative for pallor. or rash  Neurological: Negative for weakness, light-headedness, numbness and headaches.  Hematological: Negative for adenopathy. Does not bruise/bleed easily.  Psychiatric/Behavioral: Negative for dysphoric mood. The patient is not nervous/anxious.          Objective:   Physical Exam  Constitutional: She appears well-developed and well-nourished. No distress.  HENT:  Head: Normocephalic and atraumatic.  Mouth/Throat: Oropharynx is clear and moist.  Eyes: Conjunctivae and EOM are normal. Pupils are equal, round, and reactive to light.  Neck: Normal range of motion. Neck supple. No JVD present. No thyromegaly present.  Cardiovascular: Normal rate, regular rhythm, normal heart sounds and intact distal pulses.   Pulmonary/Chest: Effort normal and breath sounds normal. No respiratory distress. She has no wheezes.  Abdominal: Soft. Bowel sounds are normal. She exhibits no distension and no mass. There is no tenderness.  Musculoskeletal: Normal range of motion. She exhibits no edema and no tenderness.  Lymphadenopathy:    She has no cervical adenopathy.  Neurological: She is alert. She has normal reflexes. No cranial nerve deficit.  Coordination normal.  Skin: Skin is warm and dry. No rash noted. No pallor.       Callous on bottom of R foot under 2nd metatarsal head Cleaned an debrided with scalpel - pt tolerated well   Psychiatric: She has a normal mood and affect.          Assessment & Plan:

## 2010-10-16 NOTE — Assessment & Plan Note (Signed)
On lipitor for hyperlipidemia with non occlusive CAD on cath Disc imp of low sat fat diet -- rev this in detail  Lab check for lipid/liver in 3 weeks

## 2010-10-16 NOTE — Assessment & Plan Note (Signed)
Found on cath at recent hosp Disc risk reduction with statin/ diet /exercise and baby asa daily Will continue to follow Update if cp re occurs

## 2010-10-16 NOTE — Assessment & Plan Note (Signed)
R foot small callus under hammer toe base  Was sterilly cleaned and pared down with scalpel with relief Disc hygiene and f/u if problems

## 2010-10-17 ENCOUNTER — Encounter: Payer: Self-pay | Admitting: Family Medicine

## 2010-12-29 ENCOUNTER — Ambulatory Visit (INDEPENDENT_AMBULATORY_CARE_PROVIDER_SITE_OTHER): Payer: Medicare Other | Admitting: Family Medicine

## 2010-12-29 ENCOUNTER — Encounter: Payer: Self-pay | Admitting: Family Medicine

## 2010-12-29 DIAGNOSIS — B009 Herpesviral infection, unspecified: Secondary | ICD-10-CM | POA: Insufficient documentation

## 2010-12-29 DIAGNOSIS — E785 Hyperlipidemia, unspecified: Secondary | ICD-10-CM

## 2010-12-29 DIAGNOSIS — Z23 Encounter for immunization: Secondary | ICD-10-CM

## 2010-12-29 DIAGNOSIS — L84 Corns and callosities: Secondary | ICD-10-CM

## 2010-12-29 LAB — COMPREHENSIVE METABOLIC PANEL
ALT: 27 U/L (ref 0–35)
AST: 28 U/L (ref 0–37)
Albumin: 4 g/dL (ref 3.5–5.2)
Alkaline Phosphatase: 68 U/L (ref 39–117)
BUN: 17 mg/dL (ref 6–23)
CO2: 29 mEq/L (ref 19–32)
Calcium: 9.2 mg/dL (ref 8.4–10.5)
Chloride: 106 mEq/L (ref 96–112)
Creatinine, Ser: 1 mg/dL (ref 0.4–1.2)
GFR: 59.22 mL/min — ABNORMAL LOW (ref >60.00)
Glucose, Bld: 93 mg/dL (ref 70–99)
Potassium: 3.9 mEq/L (ref 3.5–5.1)
Sodium: 140 mEq/L (ref 135–145)
Total Bilirubin: 1 mg/dL (ref 0.3–1.2)
Total Protein: 7 g/dL (ref 6.0–8.3)

## 2010-12-29 LAB — LIPID PANEL
Cholesterol: 146 mg/dL (ref 0–200)
HDL: 71.1 mg/dL (ref >39.00)
LDL Cholesterol: 65 mg/dL (ref 0–99)
Total CHOL/HDL Ratio: 2
Triglycerides: 52 mg/dL (ref 0.0–149.0)
VLDL: 10.4 mg/dL (ref 0.0–40.0)

## 2010-12-29 NOTE — Patient Instructions (Signed)
If you have a herpes outbreak - call and let me know so I can px you valtrex I worked on calluses today- soak feet and keep them moisturized- take good care of them  Labs for cholesterol today Avoid red meat/ fried foods/ egg yolks/ fatty breakfast meats/ butter, cheese and high fat dairy/ and shellfish

## 2010-12-29 NOTE — Assessment & Plan Note (Signed)
Due for labs on lipitor and fair diet Disc goals for lipids and reasons to control them Rev labs with pt- from last draw with LDL in 140s Rev low sat fat diet in detail

## 2010-12-29 NOTE — Assessment & Plan Note (Signed)
2 calluses on lateral feet and also inner large toes were sterilly debrided with relief Disc ways to treat calluses - and imp of soaking and good foot care  Will return to podiatrist when ready

## 2010-12-29 NOTE — Assessment & Plan Note (Signed)
Pt has occ flares - GU No problem now  Told to call if she needs valtrex for outbreak

## 2010-12-29 NOTE — Progress Notes (Signed)
Subjective:    Patient ID: [name], female    DOB: 04-01-1940, 70 y.o.   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 COLONIC POLYPS  . UNSPECIFIED VITAMIN D DEFICIENCY  . HYPERLIPIDEMIA  . ALLERGIC RHINITIS  . GERD  . IBS  . INTERSTITIAL CYSTITIS  . HAND PAIN, BILATERAL  . CRAMP OF LIMB  . OSTEOPOROSIS  . HYPERGLYCEMIA  . INSOMNIA  . Callus of foot  . Coronary artery disease, non-occlusive  . Herpes simplex   Past Medical History  Diagnosis Date  . Hyperlipidemia   . Osteoporosis   . IC (interstitial cystitis)   . Heel spur   . IBS (irritable bowel syndrome)   . GERD (gastroesophageal reflux disease)   . Hyperglycemia   . Vitamin D deficiency   . Insomnia disorder related to known organic factor   . Coronary artery disease, non-occlusive   . HSV (herpes simplex virus) infection    Past Surgical History  Procedure Date  . Rotator cuff repair     2 TIMES  . Abdominal hysterectomy   . Cholecystectomy   . Cardiac catheterization 7/12    non obst dz   History  Substance Use Topics  . Smoking status: Never Smoker   . Smokeless tobacco: Not on file  . Alcohol Use: Not on file   Family History  Problem Relation Age of Onset  . Alcohol abuse Mother   . Heart attack Mother   . GER disease Mother   . GER disease Father   . Alcohol abuse Father   . Stroke Father   . Tuberculosis Father   . Heart attack Father   . Aneurysm Father   . Cancer  Sister     FEMALE  . Mental illness Sister   . Depression Sister    Allergies  Allergen Reactions  . Codeine     REACTION: nausea  . Meperidine Hcl     REACTION: nausea  . Penicillins     REACTION: hives  . Propoxyphene Hcl     REACTION: nausea  . Sulfonamide Derivatives     REACTION: hives  . Tramadol Hcl     REACTION: nausea and vomiting and felt like floating   Current Outpatient Prescriptions on File Prior to Visit  Medication Sig Dispense Refill  . aspirin 81 MG tablet Take 81 mg by mouth daily.        Marland Kitchen atorvastatin (LIPITOR) 40 MG tablet Take 40 mg by mouth daily.       . Calcium Carbonate (CALTRATE 600) 1500 MG TABS Take 1 tablet by mouth 2 (two) times daily.       . cholecalciferol (VITAMIN D) 1000 UNITS tablet Take 2,000 Units by mouth daily.       . Docosahexaenoic Acid (ALGAL-900 DHA PO) Take 1 tablet by mouth daily.        Marland Kitchen  fish oil-omega-3 fatty acids 1000 MG capsule Take 1 g by mouth daily.        Marland Kitchen ibuprofen (ADVIL,MOTRIN) 200 MG tablet Take 200 mg by mouth every 6 (six) hours as needed.        . ergocalciferol (VITAMIN D2) 50000 UNITS capsule Take 50,000 Units by mouth once a week.        . Probiotic Product (PROBIOTIC FORMULA) CAPS Take 1 capsule by mouth as directed.            Review of Systems Review of Systems  Constitutional: Negative for fever, appetite change, fatigue and unexpected weight change.  Eyes: Negative for pain and visual disturbance.  Respiratory: Negative for cough and shortness of breath.   Cardiovascular: Negative for cp or palpitations    Gastrointestinal: Negative for nausea, diarrhea and constipation.  Genitourinary: Negative for urgency and frequency.  Skin: Negative for pallor or rash   MSK pos or foot pain  Neurological: Negative for weakness, light-headedness, numbness and headaches.  Hematological: Negative for adenopathy. Does not bruise/bleed easily.  Psychiatric/Behavioral: Negative for dysphoric mood. The patient is  not nervous/anxious.          Objective:   Physical Exam  Constitutional: She appears well-developed and well-nourished. No distress.       Well appearing with poor hygiene  HENT:  Head: Normocephalic and atraumatic.  Mouth/Throat: Oropharynx is clear and moist.  Eyes: Conjunctivae and EOM are normal. Pupils are equal, round, and reactive to light. No scleral icterus.  Neck: Normal range of motion. Neck supple. No JVD present. No thyromegaly present.  Cardiovascular: Normal rate, regular rhythm, normal heart sounds and intact distal pulses.  Exam reveals no gallop.   Pulmonary/Chest: Effort normal and breath sounds normal. No respiratory distress. She has no wheezes.  Abdominal: Soft. Bowel sounds are normal. She exhibits no distension and no mass. There is no tenderness.  Musculoskeletal: Normal range of motion. She exhibits tenderness. She exhibits no edema.       Hammertoes diffusely - with some tenderness of metatarsal heads    Lymphadenopathy:    She has no cervical adenopathy.  Neurological: She is alert. She has normal reflexes. She exhibits normal muscle tone. Coordination normal.  Skin: Skin is warm and dry. No rash noted. No erythema. No pallor.       Toenails are thickened and broken off in places Contractures/ OA and hammertoes noted  Calluses on lateral feet and medial big toes were pared down sterily without pain  Disc foot care in detail  Psychiatric: She has a normal mood and affect.          Assessment & Plan:

## 2011-01-01 ENCOUNTER — Other Ambulatory Visit: Payer: Self-pay | Admitting: Internal Medicine

## 2011-01-16 ENCOUNTER — Other Ambulatory Visit: Payer: Self-pay | Admitting: Cardiovascular Disease

## 2011-04-09 ENCOUNTER — Other Ambulatory Visit: Payer: Self-pay

## 2011-04-09 MED ORDER — ATORVASTATIN CALCIUM 40 MG PO TABS: 40.0000 mg | ORAL_TABLET | Freq: Every day | ORAL | Status: DC

## 2011-04-09 NOTE — Telephone Encounter (Signed)
[facility] faxed refill for Atorvastatin 40 mg #90 x 3.

## 2011-04-20 ENCOUNTER — Encounter: Payer: Self-pay | Admitting: Family Medicine

## 2011-04-20 ENCOUNTER — Ambulatory Visit (INDEPENDENT_AMBULATORY_CARE_PROVIDER_SITE_OTHER): Payer: Medicare Other | Admitting: Family Medicine

## 2011-04-20 VITALS — BP 120/70 | HR 88 | Temp 97.6°F | Ht 64.0 in | Wt 165.8 lb

## 2011-04-20 DIAGNOSIS — J329 Chronic sinusitis, unspecified: Secondary | ICD-10-CM | POA: Insufficient documentation

## 2011-04-20 DIAGNOSIS — A499 Bacterial infection, unspecified: Secondary | ICD-10-CM

## 2011-04-20 DIAGNOSIS — B9689 Other specified bacterial agents as the cause of diseases classified elsewhere: Secondary | ICD-10-CM

## 2011-04-20 MED ORDER — AZITHROMYCIN 250 MG PO TABS: ORAL_TABLET | ORAL | Status: AC

## 2011-04-20 NOTE — Progress Notes (Signed)
Subjective:    Patient ID: [name], female    DOB: 26-Jun-1940, 71 y.o.   MRN: [id number]  HPI Several weeks ago had some chest wall pain with deep breat - R side - then that went away and cold symptoms started 2 weeks ago- runny/stuffy nose Now very congested , some facial pain - sinuses are loaded  Head feels "buzzy" and blowing out clear and white mucous Coughing up green  No wheeze or sob  Has had fever - sweats 2 nights ago  No n/v/d   otc -- nyquil , allergy med [facility]        Was on an abx for abcess tooth - was on clindamycin - at beginning of the cold That is better  Patient Active Problem List  Diagnoses  . COLONIC POLYPS  . UNSPECIFIED VITAMIN D DEFICIENCY  . HYPERLIPIDEMIA  . ALLERGIC RHINITIS  . GERD  . IBS  . INTERSTITIAL CYSTITIS  . HAND PAIN, BILATERAL  . CRAMP OF LIMB  . OSTEOPOROSIS  . HYPERGLYCEMIA  . INSOMNIA  . Callus of foot  . Coronary artery disease, non-occlusive  . Herpes simplex  . Bacterial sinusitis   Past Medical History  Diagnosis Date  . Hyperlipidemia   . Osteoporosis   . IC (interstitial cystitis)   . Heel spur   . IBS (irritable bowel syndrome)   . GERD (gastroesophageal reflux disease)   . Hyperglycemia   . Vitamin d deficiency   . Insomnia disorder related to known organic factor   . Coronary artery disease, non-occlusive   . HSV (herpes simplex virus) infection    Past Surgical History  Procedure Date  . Rotator cuff repair     2 TIMES  . Abdominal hysterectomy   . Cholecystectomy   . Cardiac catheterization 7/12    non obst dz   History  Substance Use Topics  . Smoking status: Never Smoker   . Smokeless tobacco: Not on file  . Alcohol Use: Not on file   Family History  Problem Relation Age of Onset  . Alcohol abuse Mother   . Heart attack Mother   . GER disease Mother   . GER disease Father   . Alcohol abuse Father   . Stroke Father   . Tuberculosis Father   . Heart attack Father   .  Aneurysm Father   . Cancer Sister     FEMALE  . Mental illness Sister   . Depression Sister    Allergies  Allergen Reactions  . Codeine     REACTION: nausea  . Meperidine Hcl     REACTION: nausea  . Penicillins     REACTION: hives  . Propoxyphene Hcl     REACTION: nausea  . Sulfonamide Derivatives     REACTION: hives  . Tramadol Hcl     REACTION: nausea and vomiting and felt like floating   Current Outpatient Prescriptions on File Prior to Visit  Medication Sig Dispense Refill  . aspirin 81 MG tablet Take 81 mg by mouth daily.        Marland Kitchen atorvastatin (LIPITOR) 40 MG tablet Take 1 tablet (40 mg total) by mouth daily.  90 tablet  3  . Calcium Carbonate (CALTRATE 600) 1500 MG TABS Take 1 tablet by mouth 2 (two) times daily.       . calcium carbonate (TUMS EX) 750 MG chewable tablet Chew 2 tablets by mouth as needed.        . cholecalciferol (  VITAMIN D) 1000 UNITS tablet Take 2,000 Units by mouth daily.       . Docosahexaenoic Acid (ALGAL-900 DHA PO) Take 1 tablet by mouth daily.        . ergocalciferol (VITAMIN D2) 50000 UNITS capsule Take 50,000 Units by mouth once a week.        . fish oil-omega-3 fatty acids 1000 MG capsule Take 1 g by mouth daily.        Marland Kitchen ibuprofen (ADVIL,MOTRIN) 200 MG tablet Take 200 mg by mouth every 6 (six) hours as needed.        . Probiotic Product (PROBIOTIC FORMULA) CAPS Take 1 capsule by mouth as directed.            Review of Systems Review of Systems  Constitutional: Negative for , appetite change,  and unexpected weight change. pos for fatigue Eyes: Negative for pain and visual disturbance.  ENT neg for ear pain or drainage , pos for sinus pain  Respiratory: Negative for sob or wheeze  Cardiovascular: Negative for cp or palpitations    Gastrointestinal: Negative for nausea, diarrhea and constipation.  Genitourinary: Negative for urgency and frequency.  Skin: Negative for pallor or rash   Neurological: Negative for weakness, light-headedness,  numbness and headaches.  Hematological: Negative for adenopathy. Does not bruise/bleed easily.  Psychiatric/Behavioral: Negative for dysphoric mood. The patient is not nervous/anxious.          Objective:   Physical Exam  Constitutional: She appears well-developed and well-nourished. No distress.  HENT:  Head: Normocephalic and atraumatic.  Right Ear: External ear normal.  Left Ear: External ear normal.  Mouth/Throat: Oropharynx is clear and moist.       Nares are injected and congested  Diffuse maxillary and frontal sinus tenderness TMs dull but clear Throat- post nasal drip  Eyes: Conjunctivae and EOM are normal. Pupils are equal, round, and reactive to light. Right eye exhibits no discharge. Left eye exhibits no discharge.  Neck: Normal range of motion. Neck supple. No thyromegaly present.  Cardiovascular: Normal rate, regular rhythm and normal heart sounds.   Pulmonary/Chest: Effort normal and breath sounds normal. No respiratory distress. She has no wheezes. She has no rales.  Lymphadenopathy:    She has no cervical adenopathy.  Neurological: She is alert.  Skin: Skin is warm and dry. No rash noted. No erythema. No pallor.  Psychiatric: She has a normal mood and affect.          Assessment & Plan:

## 2011-04-20 NOTE — Patient Instructions (Signed)
I think you have a sinus infection-take the zpak for that  The cold is getting  Better mucinex is fine Drink lots of water  Use nasal saline spray for congestion Update if not starting to improve in a week or if worsening

## 2011-04-20 NOTE — Assessment & Plan Note (Signed)
With sinus cong/ facial pain and fever 2 wk after start of viral uri Will cover with zpak (pxn and sulfa all)- Update if not starting to improve in a week or if worsening   Disc symptomatic care - see instructions on AVS

## 2011-04-26 ENCOUNTER — Telehealth: Payer: Self-pay | Admitting: Family Medicine

## 2011-04-26 NOTE — Telephone Encounter (Signed)
Patient notified as instructed by telephone. 

## 2011-04-26 NOTE — Telephone Encounter (Signed)
Patient finished her Z-Pak yesterday.  Patient still has a runny nose,clear discharge,cough,congestion,and dizzy.  Patient has been taking Mucinex D and Nyquil at night.  Patient wants to know if she can try something else,since she's not feeling better.  [facility] uses [facility].

## 2011-04-26 NOTE — Telephone Encounter (Signed)
I think it is going to take some more time and the zithromax will be in her system for another week - update if no further improvement at the end of the week

## 2011-04-29 ENCOUNTER — Telehealth: Payer: Self-pay | Admitting: Family Medicine

## 2011-04-29 NOTE — Telephone Encounter (Signed)
To: [facility] (Daytime Triage) Fax: [phone] From: [facility] Date/ Time: 04/29/2011 11:38 AM Taken By: Annalee Genta, CSR Caller: [place] Facility: not collected Patient: Yvonne, [name] DOB: 1940-12-03 Phone: [phone] Reason for Call: Pt is very concerned over TV attorney ad re Lipitor causing diabetes in women. She is pre-diabetic and wants to speak w/Dr Milinda Antis re this; should change this med or stop taking? Please call to advise. Regarding Appointment: Appt Date: Appt Time: Unknown Provider: Reason: Details: Outcome:

## 2011-05-02 NOTE — Telephone Encounter (Signed)
That is a class risk with statins that is mild,(and control of cholesterol is important)- , if she wants to stop it for 3 mo and check labs we can certainly do that , just to see Let me know  In any case-keep watching diet for sugar and fat

## 2011-05-03 NOTE — Telephone Encounter (Signed)
Patient notified as instructed by telephone. Pt said she will stop med for 3 months and watch diet carefully and will call back in 3 months to schedule fasting labs.

## 2011-07-16 ENCOUNTER — Ambulatory Visit (INDEPENDENT_AMBULATORY_CARE_PROVIDER_SITE_OTHER): Payer: Medicare Other | Admitting: Family Medicine

## 2011-07-16 ENCOUNTER — Encounter: Payer: Self-pay | Admitting: Family Medicine

## 2011-07-16 VITALS — BP 110/62 | HR 64 | Temp 97.7°F | Ht 64.0 in | Wt 158.2 lb

## 2011-07-16 DIAGNOSIS — S30861A Insect bite (nonvenomous) of abdominal wall, initial encounter: Secondary | ICD-10-CM

## 2011-07-16 DIAGNOSIS — W57XXXA Bitten or stung by nonvenomous insect and other nonvenomous arthropods, initial encounter: Secondary | ICD-10-CM

## 2011-07-16 DIAGNOSIS — L84 Corns and callosities: Secondary | ICD-10-CM

## 2011-07-16 DIAGNOSIS — T148XXA Other injury of unspecified body region, initial encounter: Secondary | ICD-10-CM

## 2011-07-16 DIAGNOSIS — S30860A Insect bite (nonvenomous) of lower back and pelvis, initial encounter: Secondary | ICD-10-CM

## 2011-07-16 DIAGNOSIS — M25579 Pain in unspecified ankle and joints of unspecified foot: Secondary | ICD-10-CM | POA: Insufficient documentation

## 2011-07-16 MED ORDER — DOXYCYCLINE HYCLATE 100 MG PO TABS: 100.0000 mg | ORAL_TABLET | Freq: Two times a day (BID) | ORAL | Status: AC

## 2011-07-16 NOTE — Assessment & Plan Note (Signed)
Itchy and red with 3-4 cm oval of erythema (but no target shape) Also c/o sore joints in ankles Check lyme test  tx proph with doxycycline and update when lab return inst pt to call if worse symptoms or fever or new findings

## 2011-07-16 NOTE — Assessment & Plan Note (Signed)
Recurrent callus of R foot under 2nd MT head - was pared down in sterile fashion with relief  Other areas may be plantar warts- L foot - will hold off on tx due to pt's need to be on feet all the time

## 2011-07-16 NOTE — Assessment & Plan Note (Signed)
With recent tick bite, pt is concerned about lyme dz  Lyme test today and update  Did put on doxycycline until we get a result

## 2011-07-16 NOTE — Patient Instructions (Signed)
Keep tick bite area clean with soap and water  You can use some cort aid otc for the itch  Try not to scratch Lyme dz lab today Start doxycycline 100 mg twice daily -sent to your pharmacy If fever or other symptoms let me know  I shaved down callus on right foot - keep that as clean as possible

## 2011-07-16 NOTE — Progress Notes (Signed)
Subjective:    Patient ID: [name], female    DOB: 05-28-1940, 71 y.o.   MRN: [id number]  HPI Several tick bites and ankle pain  Also painful callouses on feet   One 2 weeks ago behind L knee - healed up pretty quickly  ? How big that tick was   Now has a bite on abdomen- is more red and itchy 83 days old Used a spray antibiotic  Got whole tick off - was light in color , medium size   No rash anywhere on body  Feels ok  No fever   Started having ankle pain and stiffness , and a little swollen at night  Hurts most in front of the ankle  Worse in past 2 weeks  No redness of joint No other joints acting up   callouses on bottom of her feet   Patient Active Problem List  Diagnoses  . COLONIC POLYPS  . UNSPECIFIED VITAMIN D DEFICIENCY  . HYPERLIPIDEMIA  . ALLERGIC RHINITIS  . GERD  . IBS  . INTERSTITIAL CYSTITIS  . HAND PAIN, BILATERAL  . CRAMP OF LIMB  . OSTEOPOROSIS  . HYPERGLYCEMIA  . INSOMNIA  . Callus of foot  . Coronary artery disease, non-occlusive  . Herpes simplex  . Bacterial sinusitis  . Tick bite of abdomen  . Ankle joint pain   Past Medical History  Diagnosis Date  . Hyperlipidemia   . Osteoporosis   . IC (interstitial cystitis)   . Heel spur   . IBS (irritable bowel syndrome)   . GERD (gastroesophageal reflux disease)   . Hyperglycemia   . Vitamin d deficiency   . Insomnia disorder related to known organic factor   . Coronary artery disease, non-occlusive   . HSV (herpes simplex virus) infection    Past Surgical History  Procedure Date  . Rotator cuff repair     2 TIMES  . Abdominal hysterectomy   . Cholecystectomy   . Cardiac catheterization 7/12    non obst dz   History  Substance Use Topics  . Smoking status: Never Smoker   . Smokeless tobacco: Not on file  . Alcohol Use: Not on file   Family History  Problem Relation Age of Onset  . Alcohol abuse Mother   . Heart attack Mother   . GER disease Mother   . GER  disease Father   . Alcohol abuse Father   . Stroke Father   . Tuberculosis Father   . Heart attack Father   . Aneurysm Father   . Cancer Sister     FEMALE  . Mental illness Sister   . Depression Sister    Allergies  Allergen Reactions  . Codeine     REACTION: nausea  . Meperidine Hcl     REACTION: nausea  . Penicillins     REACTION: hives  . Propoxyphene Hcl     REACTION: nausea  . Sulfonamide Derivatives     REACTION: hives  . Tramadol Hcl     REACTION: nausea and vomiting and felt like floating   Current Outpatient Prescriptions on File Prior to Visit  Medication Sig Dispense Refill  . Ascorbic Acid (VITAMIN C) 1000 MG tablet Take 1,000 mg by mouth daily.      Marland Kitchen aspirin 81 MG tablet Take 81 mg by mouth daily.        . Calcium Carbonate (CALTRATE 600) 1500 MG TABS Take 1 tablet by mouth 2 (two) times daily.       Marland Kitchen  calcium carbonate (TUMS EX) 750 MG chewable tablet Chew 2 tablets by mouth as needed.        . cholecalciferol (VITAMIN D) 1000 UNITS tablet Take 2,000 Units by mouth daily.       . Docosahexaenoic Acid (ALGAL-900 DHA PO) Take 1 tablet by mouth daily.        . ergocalciferol (VITAMIN D2) 50000 UNITS capsule Take 50,000 Units by mouth once a week.        . fish oil-omega-3 fatty acids 1000 MG capsule Take 1 g by mouth daily.        Marland Kitchen ibuprofen (ADVIL,MOTRIN) 200 MG tablet Take 200 mg by mouth every 6 (six) hours as needed.        . Probiotic Product (PROBIOTIC FORMULA) CAPS Take 1 capsule by mouth as directed.              Review of Systems   Review of Systems  Constitutional: Negative for fever, appetite change, fatigue and unexpected weight change.  Eyes: Negative for pain and visual disturbance.  Respiratory: Negative for cough and shortness of breath.   Cardiovascular: Negative for cp or palpitations    Gastrointestinal: Negative for nausea, diarrhea and constipation.  Genitourinary: Negative for urgency and frequency.  Skin: Negative for pallor or  rash  pos for pain in feet from callouses, pos for insect bite with redness MSK pos for stiffness of ankles, neg for other joint pain or swelling  Neurological: Negative for weakness, light-headedness, numbness and headaches.  Hematological: Negative for adenopathy. Does not bruise/bleed easily.  Psychiatric/Behavioral: Negative for dysphoric mood. The patient is not nervous/anxious.      Objective:   Physical Exam  Constitutional: She appears well-developed and well-nourished.  HENT:  Head: Normocephalic and atraumatic.  Mouth/Throat: Oropharynx is clear and moist.  Eyes: Conjunctivae and EOM are normal. Pupils are equal, round, and reactive to light. No scleral icterus.  Neck: Normal range of motion. Neck supple. No JVD present. Carotid bruit is not present. No thyromegaly present.  Cardiovascular: Normal rate, regular rhythm, normal heart sounds and intact distal pulses.  Exam reveals no gallop.   Pulmonary/Chest: Effort normal and breath sounds normal. No respiratory distress. She has no wheezes.  Abdominal: Soft. Bowel sounds are normal. She exhibits no distension, no abdominal bruit and no mass. There is no tenderness.       See skin exam  Musculoskeletal: She exhibits tenderness. She exhibits no edema.       Ankles- no swelling noted  Some tenderness Nl rom    Lymphadenopathy:    She has no cervical adenopathy.  Neurological: She is alert. She has normal reflexes. No cranial nerve deficit. She exhibits normal muscle tone. Coordination normal.  Skin: Skin is warm and dry. There is erythema. No pallor.       Tick bite R mid abdomen- 3-4 cm erythema in oval / slt induration No tick parts remaining  No target shape   1cm plantar callus R under 2nd MT head - pared down in sterile fashion with relief   Plantar warts obs on L foot-not treated today  Psychiatric: She has a normal mood and affect.          Assessment & Plan:

## 2011-07-20 LAB — B. BURGDORFI ANTIBODIES: B burgdorferi Ab IgG+IgM: 0.17 {ISR}

## 2011-08-13 ENCOUNTER — Telehealth: Payer: Self-pay

## 2011-08-13 DIAGNOSIS — R112 Nausea with vomiting, unspecified: Secondary | ICD-10-CM

## 2011-08-13 DIAGNOSIS — K589 Irritable bowel syndrome without diarrhea: Secondary | ICD-10-CM

## 2011-08-13 NOTE — Telephone Encounter (Signed)
Pt request GI referral; since a teenager on and off episodes of N&V, diarrhea and constipation. Symptoms worsening. [facility] if needed.Pt will wait to hear from pt care coordinator.Please advise.

## 2011-08-14 DIAGNOSIS — R112 Nausea with vomiting, unspecified: Secondary | ICD-10-CM | POA: Insufficient documentation

## 2011-08-14 NOTE — Telephone Encounter (Signed)
Here is the referral 

## 2011-10-12 ENCOUNTER — Other Ambulatory Visit (INDEPENDENT_AMBULATORY_CARE_PROVIDER_SITE_OTHER): Payer: Medicare Other

## 2011-10-12 DIAGNOSIS — E785 Hyperlipidemia, unspecified: Secondary | ICD-10-CM

## 2011-10-12 DIAGNOSIS — R7309 Other abnormal glucose: Secondary | ICD-10-CM

## 2011-10-12 DIAGNOSIS — R739 Hyperglycemia, unspecified: Secondary | ICD-10-CM

## 2011-10-12 LAB — LIPID PANEL
Cholesterol: 218 mg/dL — ABNORMAL HIGH (ref 0–200)
HDL: 77.1 mg/dL (ref >39.00)
Total CHOL/HDL Ratio: 3
Triglycerides: 56 mg/dL (ref 0.0–149.0)
VLDL: 11.2 mg/dL (ref 0.0–40.0)

## 2011-10-12 LAB — LDL CHOLESTEROL, DIRECT: Direct LDL: 135.4 mg/dL

## 2011-10-12 LAB — AST: AST: 18 U/L (ref 0–37)

## 2011-10-12 LAB — GLUCOSE, RANDOM: Glucose, Bld: 65 mg/dL — ABNORMAL LOW (ref 70–99)

## 2011-10-12 LAB — ALT: ALT: 19 U/L (ref 0–35)

## 2011-11-18 ENCOUNTER — Ambulatory Visit (INDEPENDENT_AMBULATORY_CARE_PROVIDER_SITE_OTHER): Payer: Medicare Other

## 2011-11-18 DIAGNOSIS — Z23 Encounter for immunization: Secondary | ICD-10-CM

## 2012-07-05 ENCOUNTER — Ambulatory Visit (INDEPENDENT_AMBULATORY_CARE_PROVIDER_SITE_OTHER): Payer: Medicare Other | Admitting: Family Medicine

## 2012-07-05 ENCOUNTER — Encounter: Payer: Self-pay | Admitting: Family Medicine

## 2012-07-05 VITALS — BP 118/68 | HR 72 | Temp 98.3°F | Ht 64.0 in | Wt 162.5 lb

## 2012-07-05 DIAGNOSIS — L84 Corns and callosities: Secondary | ICD-10-CM

## 2012-07-05 DIAGNOSIS — E785 Hyperlipidemia, unspecified: Secondary | ICD-10-CM

## 2012-07-05 DIAGNOSIS — R7309 Other abnormal glucose: Secondary | ICD-10-CM

## 2012-07-05 DIAGNOSIS — L821 Other seborrheic keratosis: Secondary | ICD-10-CM | POA: Insufficient documentation

## 2012-07-05 LAB — LIPID PANEL
Cholesterol: 225 mg/dL — ABNORMAL HIGH (ref 0–200)
HDL: 73.3 mg/dL (ref >39.00)
Total CHOL/HDL Ratio: 3
Triglycerides: 72 mg/dL (ref 0.0–149.0)
VLDL: 14.4 mg/dL (ref 0.0–40.0)

## 2012-07-05 LAB — COMPREHENSIVE METABOLIC PANEL
ALT: 17 U/L (ref 0–35)
AST: 19 U/L (ref 0–37)
Albumin: 3.8 g/dL (ref 3.5–5.2)
Alkaline Phosphatase: 65 U/L (ref 39–117)
BUN: 19 mg/dL (ref 6–23)
CO2: 28 mEq/L (ref 19–32)
Calcium: 9.1 mg/dL (ref 8.4–10.5)
Chloride: 106 mEq/L (ref 96–112)
Creatinine, Ser: 1 mg/dL (ref 0.4–1.2)
GFR: 56.95 mL/min — ABNORMAL LOW (ref >60.00)
Glucose, Bld: 75 mg/dL (ref 70–99)
Potassium: 3.9 mEq/L (ref 3.5–5.1)
Sodium: 139 mEq/L (ref 135–145)
Total Bilirubin: 0.5 mg/dL (ref 0.3–1.2)
Total Protein: 6.7 g/dL (ref 6.0–8.3)

## 2012-07-05 LAB — LDL CHOLESTEROL, DIRECT: Direct LDL: 138.9 mg/dL

## 2012-07-05 LAB — HEMOGLOBIN A1C: Hgb A1c MFr Bld: 6.1 % (ref 4.6–6.5)

## 2012-07-05 NOTE — Assessment & Plan Note (Signed)
Mid back b9 appearing Adv not to scratch or traumatize it

## 2012-07-05 NOTE — Assessment & Plan Note (Signed)
a1c today Disc imp of low glycemic diet and active lifestyle/ wt control

## 2012-07-05 NOTE — Progress Notes (Signed)
Subjective:    Patient ID: [name], female    DOB: [birth year], 72 y.o.   MRN: [id number]  HPI Here for diabetes screen and also to check spot on back and foot   She had to go to her opthy- Dr Hazle Quant - problems with L eye - developing glaucoma  No hx of DM  Wants to keep a check of that  Lab Results  Component Value Date   HGBA1C 6.1 01/09/2010   hyperglycemia in the past  Today ate bacon and cheerios and water  Does not watch diet very closely-husband eats a lot of sugar   Spot to check on back -- a mole - changed to ? Darker color - after scratching with a back scratcher  Foot - has a callous under metatarsal - have shaved it in the past with relief   Knee pain occasionally - ibuprofen helped L knee - occ stiff  Had to feed horses with a cane for a week   Wt is up 4 lb with bmi of 27  Patient Active Problem List   Diagnosis Date Noted  . Seborrheic keratosis 07/05/2012  . Ankle joint pain 07/16/2011  . 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 Callus of foot 10/16/2010  . Coronary artery disease, non-occlusive 10/16/2010  . INSOMNIA 02/02/2010  . HAND PAIN, BILATERAL 01/09/2010  . CRAMP OF LIMB 01/09/2010  . HYPERGLYCEMIA 07/29/2009  . ALLERGIC RHINITIS 12/04/2008  . UNSPECIFIED VITAMIN D DEFICIENCY 04/30/2008  . COLONIC POLYPS 06/28/2007  . GERD 06/28/2007  . HYPERLIPIDEMIA 03/17/2007  . IBS 03/17/2007  . INTERSTITIAL CYSTITIS 03/17/2007  . OSTEOPOROSIS 03/17/2007   Past Medical History  Diagnosis Date  . Hyperlipidemia   . Osteoporosis   . IC (interstitial cystitis)   . Heel spur   . IBS (irritable bowel syndrome)   . GERD (gastroesophageal reflux disease)   . Hyperglycemia   . Vitamin D deficiency   . Insomnia disorder related to known organic factor   . Coronary artery disease, non-occlusive   . HSV (herpes simplex virus) infection    Past Surgical History  Procedure Laterality Date  . Rotator cuff repair      2 TIMES  . Abdominal hysterectomy     . Cholecystectomy    . Cardiac catheterization  7/12    non obst dz   History  Substance Use Topics  . Smoking status: Never Smoker   . Smokeless tobacco: Not on file  . Alcohol Use: Yes     Comment: occ- wine   Family History  Problem Relation Age of Onset  . Alcohol abuse Mother   . Heart attack Mother   . GER disease Mother   . GER disease Father   . Alcohol abuse Father   . Stroke Father   . Tuberculosis Father   . Heart attack Father   . Aneurysm Father   . Cancer Sister     FEMALE  . Mental illness Sister   . Depression Sister    Allergies  Allergen Reactions  . Codeine     REACTION: nausea  . Meperidine Hcl     REACTION: nausea  . Penicillins     REACTION: hives  . Propoxyphene Hcl     REACTION: nausea  . Sulfonamide Derivatives     REACTION: hives  . Tramadol Hcl     REACTION: nausea and vomiting and felt like floating   Current Outpatient Prescriptions on File Prior to Visit  Medication Sig Dispense Refill  .  Ascorbic Acid (VITAMIN C) 1000 MG tablet Take 1,000 mg by mouth daily.      Marland Kitchen aspirin 81 MG tablet Take 81 mg by mouth daily.        . cholecalciferol (VITAMIN D) 1000 UNITS tablet Take 2,000 Units by mouth daily.       . Docosahexaenoic Acid (ALGAL-900 DHA PO) Take 1 tablet by mouth daily.        Marland Kitchen ibuprofen (ADVIL,MOTRIN) 200 MG tablet Take 200 mg by mouth every 6 (six) hours as needed.        . Probiotic Product (PROBIOTIC FORMULA) CAPS Take 1 capsule by mouth as needed.        No current facility-administered medications on file prior to visit.    Review of Systems Review of Systems  Constitutional: Negative for fever, appetite change, fatigue and unexpected weight change.  Eyes: Negative for pain and visual disturbance.  Respiratory: Negative for cough and shortness of breath.   Cardiovascular: Negative for cp or palpitations    Gastrointestinal: Negative for nausea, diarrhea and constipation.  Genitourinary: Negative for urgency and  frequency. neg for excessive urination  Skin: Negative for pallor or rash  pos for moles on back and calluses on feet  MSK pos for knee pain without swelling / neg for other joint issues  Neurological: Negative for weakness, light-headedness, numbness and headaches.  Hematological: Negative for adenopathy. Does not bruise/bleed easily.  Psychiatric/Behavioral: Negative for dysphoric mood. The patient is not nervous/anxious.         Objective:   Physical Exam  Constitutional: She appears well-developed and well-nourished. No distress.  HENT:  Head: Normocephalic and atraumatic.  Eyes: Conjunctivae and EOM are normal. Pupils are equal, round, and reactive to light.  Neck: Normal range of motion. Neck supple. No JVD present. Carotid bruit is not present.  Cardiovascular: Normal rate and regular rhythm.   Pulmonary/Chest: Effort normal and breath sounds normal.  Abdominal: Soft. Bowel sounds are normal. She exhibits no distension, no abdominal bruit and no mass. There is no tenderness.  Musculoskeletal: She exhibits no edema and no tenderness.  Deg change in foot joints bilat with hammer toes  No knee swelling, crepitus noted   Lymphadenopathy:    She has no cervical adenopathy.  Neurological: She is alert. She has normal reflexes. No cranial nerve deficit. She exhibits normal muscle tone. Coordination normal.  Skin: Skin is warm and dry. No rash noted. No erythema. No pallor.  1 cm SK mid back- tan and waxy 2 mm round brown flat nevus under R scapula  1 cm callus under 2nd metatarsal head of each foot  Cleaned and sterilly pared down with relief    Psychiatric: She has a normal mood and affect.          Assessment & Plan:

## 2012-07-05 NOTE — Patient Instructions (Addendum)
Lab today Watch diet for sugars and fats and keep getting exercise  Moles on your back look ok I worked gently on foot callouses -keep feet clean- scrub/soak them at night and use moisturizer

## 2012-07-05 NOTE — Assessment & Plan Note (Signed)
callouses under 2nd metatarsal heads (both feet) cleaned and gently shaved down Disc foot care  No numbness  Will update

## 2012-07-05 NOTE — Assessment & Plan Note (Signed)
Lipids today Rev low sat fat diet  

## 2012-07-06 ENCOUNTER — Encounter: Payer: Self-pay | Admitting: *Deleted

## 2012-12-18 ENCOUNTER — Ambulatory Visit (INDEPENDENT_AMBULATORY_CARE_PROVIDER_SITE_OTHER): Payer: Medicare Other | Admitting: Family Medicine

## 2012-12-18 ENCOUNTER — Encounter: Payer: Self-pay | Admitting: Family Medicine

## 2012-12-18 VITALS — BP 132/84 | HR 77 | Temp 99.4°F | Wt 160.5 lb

## 2012-12-18 DIAGNOSIS — J069 Acute upper respiratory infection, unspecified: Secondary | ICD-10-CM

## 2012-12-18 MED ORDER — AZITHROMYCIN 250 MG PO TABS: ORAL_TABLET | ORAL | Status: DC

## 2012-12-18 NOTE — Patient Instructions (Signed)
Drink plenty of fluids, take nyquil as needed, and gargle with warm salt water for your throat.  This should gradually improve.  Take care.  Let us know if you have other concerns.   Fill the antibiotics if you aren't improving in a few days.

## 2012-12-18 NOTE — Progress Notes (Signed)
She was trying to get her eye drops through the eye clinic.    duration of symptoms: started about 4 days ago.   Rhinorrhea: yes, clear Congestion: yes ear pain: yes, mild sore throat: yes Cough: yes Myalgias: no  Felt hot off and on.   Taking OTC cough med/decongestant and nyquil.    ROS: See HPI.  Otherwise negative.    Meds, vitals, and allergies reviewed.   GEN: nad, alert and oriented HEENT: mucous membranes moist, TM w/o erythema, nasal epithelium injected, OP with cobblestoning NECK: supple w/o LA CV: rrr. PULM: ctab, no inc wob ABD: soft, +bs EXT: no edema

## 2012-12-18 NOTE — Assessment & Plan Note (Signed)
Per patient h/o sig sinusitis in past, but not ttp x4 today.  [facility] rx for zmax and supportive tx o/w.  F/u prn.  Nontoxic.  She understood.

## 2013-03-02 ENCOUNTER — Ambulatory Visit (INDEPENDENT_AMBULATORY_CARE_PROVIDER_SITE_OTHER)
Admission: RE | Admit: 2013-03-02 | Discharge: 2013-03-02 | Disposition: A | Discharge status: Home / Self Care | Payer: Medicare HMO | Source: Ambulatory Visit | Attending: Family Medicine | Admitting: Family Medicine

## 2013-03-02 ENCOUNTER — Encounter: Payer: Self-pay | Admitting: Family Medicine

## 2013-03-02 ENCOUNTER — Ambulatory Visit (INDEPENDENT_AMBULATORY_CARE_PROVIDER_SITE_OTHER): Payer: Medicare HMO | Admitting: Family Medicine

## 2013-03-02 VITALS — BP 140/82 | HR 64 | Temp 97.7°F | Ht 64.0 in | Wt 164.5 lb

## 2013-03-02 DIAGNOSIS — M25579 Pain in unspecified ankle and joints of unspecified foot: Secondary | ICD-10-CM

## 2013-03-02 DIAGNOSIS — M25572 Pain in left ankle and joints of left foot: Secondary | ICD-10-CM

## 2013-03-02 DIAGNOSIS — Z23 Encounter for immunization: Secondary | ICD-10-CM

## 2013-03-02 MED ORDER — IBUPROFEN 800 MG PO TABS: 800.0000 mg | ORAL_TABLET | Freq: Three times a day (TID) | ORAL | Status: DC | PRN

## 2013-03-02 NOTE — Progress Notes (Signed)
Pre-visit discussion using our clinic review tool. No additional management support is needed unless otherwise documented below in the visit note.  

## 2013-03-02 NOTE — Patient Instructions (Signed)
Try the aircast ankle brace during the day Use ice on it when ever you can for 10 minutes at a time  Continue naproxen with food as needed as long as it does not hurt your stomach  We will call you with result and plan when we can

## 2013-03-02 NOTE — Progress Notes (Signed)
Subjective:    Patient ID: [name], female    DOB: 05-24-1940, 73 y.o.   MRN: [id number]  HPI Here with L foot pain today / red spot on ankle No injury  Woke up with it  Swelled a little  Improved for a while and then got worse again   Going on since 2 wk before xmas Wrapping with ace bandage  Hurts to bear weight on it - limps a bit  Is wearing high top sneakers that are supportive -that helps  Prior to that she wore her ex husband sneakers In general wider shoes are much more comfortable   Patient Active Problem List   Diagnosis Date Noted  . Pain in joint, ankle and foot 03/02/2013  . URI (upper respiratory infection) 12/18/2012  . Seborrheic keratosis 07/05/2012  . Ankle joint pain 07/16/2011  . 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 Callus of foot 10/16/2010  . Coronary artery disease, non-occlusive 10/16/2010  . INSOMNIA 02/02/2010  . HAND PAIN, BILATERAL 01/09/2010  . CRAMP OF LIMB 01/09/2010  . HYPERGLYCEMIA 07/29/2009  . ALLERGIC RHINITIS 12/04/2008  . UNSPECIFIED VITAMIN D DEFICIENCY 04/30/2008  . COLONIC POLYPS 06/28/2007  . GERD 06/28/2007  . HYPERLIPIDEMIA 03/17/2007  . IBS 03/17/2007  . INTERSTITIAL CYSTITIS 03/17/2007  . OSTEOPOROSIS 03/17/2007   Past Medical History  Diagnosis Date  . Hyperlipidemia   . Osteoporosis   . IC (interstitial cystitis)   . Heel spur   . IBS (irritable bowel syndrome)   . GERD (gastroesophageal reflux disease)   . Hyperglycemia   . Vitamin D deficiency   . Insomnia disorder related to known organic factor   . Coronary artery disease, non-occlusive   . HSV (herpes simplex virus) infection    Past Surgical History  Procedure Laterality Date  . Rotator cuff repair      2 TIMES  . Abdominal hysterectomy    . Cholecystectomy    . Cardiac catheterization  7/12    non obst dz   History  Substance Use Topics  . Smoking status: Never Smoker   . Smokeless tobacco: Not on file  . Alcohol Use: Yes     Comment:  occ- wine   Family History  Problem Relation Age of Onset  . Alcohol abuse Mother   . Heart attack Mother   . GER disease Mother   . GER disease Father   . Alcohol abuse Father   . Stroke Father   . Tuberculosis Father   . Heart attack Father   . Aneurysm Father   . Cancer Sister     FEMALE  . Mental illness Sister   . Depression Sister    Allergies  Allergen Reactions  . Codeine     REACTION: nausea  . Meperidine Hcl     REACTION: nausea  . Penicillins     REACTION: hives  . Propoxyphene Hcl     REACTION: nausea  . Sulfonamide Derivatives     REACTION: hives  . Tramadol Hcl     REACTION: nausea and vomiting and felt like floating   Current Outpatient Prescriptions on File Prior to Visit  Medication Sig Dispense Refill  . LUMIGAN 0.01 % SOLN Place 1 drop into both eyes at bedtime.      Marland Kitchen aspirin 81 MG tablet Take 81 mg by mouth daily.        Marland Kitchen ibuprofen (ADVIL,MOTRIN) 200 MG tablet Take 200 mg by mouth every 6 (six) hours as needed.  No current facility-administered medications on file prior to visit.     Review of Systems    Review of Systems  Constitutional: Negative for fever, appetite change, fatigue and unexpected weight change.  Eyes: Negative for pain and visual disturbance.  Respiratory: Negative for cough and shortness of breath.   Cardiovascular: Negative for cp or palpitations    Gastrointestinal: Negative for nausea, diarrhea and constipation.  Genitourinary: Negative for urgency and frequency.  Skin: Negative for pallor or rash   MSK pos for ankle pain / foot pain neg for joint swelling  Neurological: Negative for weakness, light-headedness, numbness and headaches.  Hematological: Negative for adenopathy. Does not bruise/bleed easily.  Psychiatric/Behavioral: Negative for dysphoric mood. The patient is not nervous/anxious.      Objective:   Physical Exam  Constitutional: She appears well-developed and well-nourished. No distress.  Well  appearing  Poor hygeine   HENT:  Head: Normocephalic and atraumatic.  Eyes: Conjunctivae and EOM are normal. Pupils are equal, round, and reactive to light.  Cardiovascular: Normal rate.   Musculoskeletal: She exhibits tenderness.       Left ankle: She exhibits swelling. She exhibits normal range of motion, no ecchymosis, no deformity and normal pulse. Tenderness. Lateral malleolus and head of 5th metatarsal tenderness found. Achilles tendon normal.       Left foot: She exhibits tenderness and bony tenderness. She exhibits no crepitus.  Baseline hammertoe deformity and toe overlap  Small area of swelling under L lateral malleolus   Neurological: She is alert. She has normal reflexes.  Favors R foot to walk  Skin: Skin is warm and dry. No rash noted.  Psychiatric: She has a normal mood and affect.          Assessment & Plan:

## 2013-03-03 NOTE — Assessment & Plan Note (Signed)
Suspect a sprain  Pt does wear shoes that do not fit  Xray ankle and foot today- pend report  Air cast- fitted to her today Ibuprofen 800 mg with food tid prn

## 2013-03-14 ENCOUNTER — Telehealth: Payer: Self-pay | Admitting: Family Medicine

## 2013-03-14 DIAGNOSIS — M25579 Pain in unspecified ankle and joints of unspecified foot: Secondary | ICD-10-CM

## 2013-03-14 NOTE — Telephone Encounter (Signed)
Pt request referral to ortho, Dr. Noemi Chapel, for left foot pain.

## 2013-03-14 NOTE — Telephone Encounter (Signed)
I already did the referral and specified that she wanted him

## 2013-03-14 NOTE — Telephone Encounter (Signed)
Pt says she is wanting to go see Dr. Para March for her feet problem. She says she has seen Dr. Para March before for her shoulder and knee and she really liked him. She wanted to know if she could go back to see him.

## 2013-03-14 NOTE — Telephone Encounter (Signed)
Ref done  

## 2013-05-01 ENCOUNTER — Ambulatory Visit (INDEPENDENT_AMBULATORY_CARE_PROVIDER_SITE_OTHER): Payer: Medicare HMO | Admitting: Family Medicine

## 2013-05-01 ENCOUNTER — Encounter: Payer: Self-pay | Admitting: Family Medicine

## 2013-05-01 VITALS — BP 140/68 | HR 63 | Temp 97.7°F | Ht 64.0 in | Wt 166.5 lb

## 2013-05-01 DIAGNOSIS — T148 Other injury of unspecified body region: Secondary | ICD-10-CM

## 2013-05-01 DIAGNOSIS — M2550 Pain in unspecified joint: Secondary | ICD-10-CM

## 2013-05-01 DIAGNOSIS — W57XXXA Bitten or stung by nonvenomous insect and other nonvenomous arthropods, initial encounter: Secondary | ICD-10-CM | POA: Insufficient documentation

## 2013-05-01 DIAGNOSIS — M26629 Arthralgia of temporomandibular joint, unspecified side: Secondary | ICD-10-CM | POA: Insufficient documentation

## 2013-05-01 MED ORDER — IBUPROFEN 800 MG PO TABS: 800.0000 mg | ORAL_TABLET | Freq: Three times a day (TID) | ORAL | Status: DC | PRN

## 2013-05-01 NOTE — Patient Instructions (Signed)
Take ibuprofen with food as needed for moderate to severe pain  You can take benadryl for sleep as needed over the counter  Labs today to screen for lyme -will update you

## 2013-05-01 NOTE — Progress Notes (Signed)
Subjective:    Patient ID: [name], female    DOB: [birth year], 73 y.o.   MRN: [id number]  HPI Here with ankle and wrist pain   She is worried about lyme dz  Gets a lot of tick bites working outdoors   Her L wrist tends to swell at times and sore  L knee occ hurts L ankle still bothers her - Dr Noemi Chapel gave her another type of wrap for it- that does not rub it wrong   Has upcoming eye surg- laser/ cataract   Had a little area of dry skin on R forearm No other rash   Last year had a tick bite with redness  Not as many tick bites this last season  She works with horses and other animals   No fever lately  Feels ok overall   Patient Active Problem List   Diagnosis Date Noted  . Pain in joint, ankle and foot 03/02/2013  . URI (upper respiratory infection) 12/18/2012  . Seborrheic keratosis 07/05/2012  . Ankle joint pain 07/16/2011  . 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 Callus of foot 10/16/2010  . Coronary artery disease, non-occlusive 10/16/2010  . INSOMNIA 02/02/2010  . HAND PAIN, BILATERAL 01/09/2010  . CRAMP OF LIMB 01/09/2010  . HYPERGLYCEMIA 07/29/2009  . ALLERGIC RHINITIS 12/04/2008  . UNSPECIFIED VITAMIN D DEFICIENCY 04/30/2008  . COLONIC POLYPS 06/28/2007  . GERD 06/28/2007  . HYPERLIPIDEMIA 03/17/2007  . IBS 03/17/2007  . INTERSTITIAL CYSTITIS 03/17/2007  . OSTEOPOROSIS 03/17/2007   Past Medical History  Diagnosis Date  . Hyperlipidemia   . Osteoporosis   . IC (interstitial cystitis)   . Heel spur   . IBS (irritable bowel syndrome)   . GERD (gastroesophageal reflux disease)   . Hyperglycemia   . Vitamin D deficiency   . Insomnia disorder related to known organic factor   . Coronary artery disease, non-occlusive   . HSV (herpes simplex virus) infection    Past Surgical History  Procedure Laterality Date  . Rotator cuff repair      2 TIMES  . Abdominal hysterectomy    . Cholecystectomy    . Cardiac catheterization  7/12    non obst dz    History  Substance Use Topics  . Smoking status: Never Smoker   . Smokeless tobacco: Not on file  . Alcohol Use: Yes     Comment: occ- wine   Family History  Problem Relation Age of Onset  . Alcohol abuse Mother   . Heart attack Mother   . GER disease Mother   . GER disease Father   . Alcohol abuse Father   . Stroke Father   . Tuberculosis Father   . Heart attack Father   . Aneurysm Father   . Cancer Sister     FEMALE  . Mental illness Sister   . Depression Sister    Allergies  Allergen Reactions  . Codeine     REACTION: nausea  . Meperidine Hcl     REACTION: nausea  . Penicillins     REACTION: hives  . Propoxyphene Hcl     REACTION: nausea  . Sulfonamide Derivatives     REACTION: hives  . Tramadol Hcl     REACTION: nausea and vomiting and felt like floating   Current Outpatient Prescriptions on File Prior to Visit  Medication Sig Dispense Refill  . aspirin 81 MG tablet Take 81 mg by mouth daily.        Marland Kitchen ibuprofen (  ADVIL,MOTRIN) 200 MG tablet Take 200 mg by mouth every 6 (six) hours as needed.        Marland Kitchen ibuprofen (ADVIL,MOTRIN) 800 MG tablet Take 1 tablet (800 mg total) by mouth every 8 (eight) hours as needed for moderate pain (with food).  60 tablet  0  . LUMIGAN 0.01 % SOLN Place 1 drop into both eyes at bedtime.       No current facility-administered medications on file prior to visit.      Review of Systems Review of Systems  Constitutional: Negative for fever, appetite change, fatigue and unexpected weight change.  Eyes: Negative for pain and visual disturbance.  Respiratory: Negative for cough and shortness of breath.   Cardiovascular: Negative for cp or palpitations    Gastrointestinal: Negative for nausea, diarrhea and constipation.  Genitourinary: Negative for urgency and frequency.  Skin: Negative for pallor or rash  neg for notable tick bites currently  MSK pos for joint pain and swelling  Neurological: Negative for weakness,  light-headedness, numbness and headaches.  Hematological: Negative for adenopathy. Does not bruise/bleed easily.  Psychiatric/Behavioral: Negative for dysphoric mood. The patient is not nervous/anxious.         Objective:   Physical Exam  Nursing note and vitals reviewed. Constitutional: She appears well-developed and well-nourished. No distress.  Well app Poor hygeine   HENT:  Head: Normocephalic and atraumatic.  Mouth/Throat: Oropharynx is clear and moist.  Eyes: Conjunctivae and EOM are normal. Pupils are equal, round, and reactive to light.  Neck: Normal range of motion. Neck supple.  Cardiovascular: Normal rate and regular rhythm.   Pulmonary/Chest: Effort normal and breath sounds normal.  Musculoskeletal: She exhibits tenderness. She exhibits no edema.  Mild tenderness of L lat wrist without limited rom  Limited rom L ankle - with brace on it  No other acute joint findings  Lymphadenopathy:    She has no cervical adenopathy.  Neurological: She is alert. She has normal reflexes.  Skin: Skin is warm and dry. No rash noted. No erythema. No pallor.  Some dry skin on arms -no other rash  Psychiatric: She has a normal mood and affect.          Assessment & Plan:

## 2013-05-01 NOTE — Progress Notes (Signed)
Pre visit review using our clinic review tool, if applicable. No additional management support is needed unless otherwise documented below in the visit note. 

## 2013-05-02 LAB — B. BURGDORFI ANTIBODIES: B burgdorferi Ab IgG+IgM: 0.58 {ISR}

## 2013-05-02 NOTE — Assessment & Plan Note (Signed)
In the past -none now and no rash In light of joint issues pt is worried about lyme  Labs today for lyme ab

## 2013-05-02 NOTE — Assessment & Plan Note (Signed)
Primarily wrist and ankle I think most likely these are rel to tendonitis She desires lyme tests-will do  Disc past trauma to joints  Use of ice/nsaids as needed F/u Dr Noemi Chapel about ankle pain

## 2013-05-29 ENCOUNTER — Ambulatory Visit: Payer: Self-pay | Admitting: Podiatry

## 2013-06-05 ENCOUNTER — Ambulatory Visit (INDEPENDENT_AMBULATORY_CARE_PROVIDER_SITE_OTHER): Payer: Commercial Managed Care - HMO | Admitting: Podiatry

## 2013-06-05 ENCOUNTER — Ambulatory Visit (INDEPENDENT_AMBULATORY_CARE_PROVIDER_SITE_OTHER): Payer: Commercial Managed Care - HMO

## 2013-06-05 ENCOUNTER — Encounter: Payer: Self-pay | Admitting: Podiatry

## 2013-06-05 ENCOUNTER — Ambulatory Visit: Payer: Self-pay

## 2013-06-05 DIAGNOSIS — R52 Pain, unspecified: Secondary | ICD-10-CM

## 2013-06-05 DIAGNOSIS — M7750 Other enthesopathy of unspecified foot: Secondary | ICD-10-CM

## 2013-06-05 DIAGNOSIS — L84 Corns and callosities: Secondary | ICD-10-CM | POA: Diagnosis not present

## 2013-06-05 MED ORDER — TRIAMCINOLONE ACETONIDE 10 MG/ML IJ SUSP
10.0000 mg | Freq: Once | INTRAMUSCULAR | Status: AC
  Administered 2013-06-05: 10 mg

## 2013-06-05 NOTE — Progress Notes (Signed)
   Subjective:    Patient ID: [name], female    DOB: 1940/08/04, 73 y.o.   MRN: [id number]  HPI  PT STATED OUTSIDE OF THE HEEL IS SORE FOR 4 MONTHS. THE FOOT IS GETTING WORSE. THE FOOT GET AGGRAVATED BY WALKING AND PUTTING PRESSURE ON IT. DR. Noemi Chapel PRESCRIBE PREDNISONE BUT IT DOES NOT HELP.    Review of Systems  Eyes: Positive for pain, redness, itching and visual disturbance.  Cardiovascular: Positive for chest pain and leg swelling.  Gastrointestinal: Positive for nausea, abdominal pain, diarrhea and constipation.  Neurological: Positive for numbness.  All other systems reviewed and are negative.      Objective:   Physical Exam        Assessment & Plan:

## 2013-06-06 NOTE — Progress Notes (Signed)
Subjective:     Patient ID: [name], female   DOB: [birth year], 73 y.o.   MRN: [id number]  HPI patient points to the outside of the left foot stating that the tendon has been getting sore and she has been in a brace from Dr. Ann Lions and has been on oral medication. Also complains of lesions in the front part of both feet which are sore when she tries to ambulate. Does not remember specific injury it has gradually occurred over the last 4 months   Review of Systems  All other systems reviewed and are negative.      Objective:   Physical Exam  Nursing note and vitals reviewed. Constitutional: She is oriented to person, place, and time.  Cardiovascular: Intact distal pulses.   Musculoskeletal: Normal range of motion.  Neurological: She is oriented to person, place, and time.  Skin: Skin is warm.   neurovascular status intact with muscle strength adequate and range of motion subtalar joint midtarsal joint within normal limits. Patient has good Fill time to the digits and is found to have inflammation lateral side ankle around the peroneal tendon group with no indications of tear or dysfunction. Keratotic lesions plantar aspect of both feet that are painful when pressed     Assessment:     Probable tendinitis of the peroneal tendon group as it goes under the lateral malleolus and keratotic lesions with porokeratotic cores    Plan:     H&P and x-ray reviewed with patient. Careful sheath injection performed left peroneal groove along with continued immobilization and debrided lesions on both feet with no bleeding noted. Reappoint her recheck

## 2013-06-14 ENCOUNTER — Telehealth: Payer: Self-pay | Admitting: *Deleted

## 2013-06-14 NOTE — Telephone Encounter (Signed)
PT CALLED SAID SHE HAD A BRUISE UNDER HER ANKLE AND SHE IS HAVING FOOT PAIN. WANTED TO SCHEDULE AN APPT WITH DR REGAL SOONER THAN 5.5.15 AND WANTED SLEEP MEDICATION. TOLD PT I COULD NOT GIVE HER ANYTHING FOR SLEEP BUT MADE HER AN APPT 4.28.15 WITH DR Paulla Dolly

## 2013-06-19 ENCOUNTER — Ambulatory Visit: Payer: Self-pay | Admitting: Podiatry

## 2013-06-26 ENCOUNTER — Ambulatory Visit: Payer: Commercial Managed Care - HMO | Admitting: Podiatry

## 2013-09-25 ENCOUNTER — Ambulatory Visit (INDEPENDENT_AMBULATORY_CARE_PROVIDER_SITE_OTHER): Payer: Medicare HMO | Admitting: Family Medicine

## 2013-09-25 ENCOUNTER — Telehealth: Payer: Self-pay | Admitting: Family Medicine

## 2013-09-25 ENCOUNTER — Encounter: Payer: Self-pay | Admitting: Family Medicine

## 2013-09-25 VITALS — BP 124/84 | HR 60 | Temp 98.2°F | Wt 161.5 lb

## 2013-09-25 DIAGNOSIS — K625 Hemorrhage of anus and rectum: Secondary | ICD-10-CM

## 2013-09-25 MED ORDER — HYDROCORTISONE ACETATE 25 MG RE SUPP: 25.0000 mg | Freq: Every day | RECTAL | Status: DC

## 2013-09-25 NOTE — Telephone Encounter (Signed)
Patient Information:  Caller Name: [name]  Phone: [phone]  Patient: Barbe, [name]  Gender: Female  DOB: Jan 01, 1941  Age: 73 Years  PCP: [facility], [facility])  Office Follow Up:  Does the office need to follow up with this patient?: No  Instructions For The Office: N/A   Symptoms  Reason For Call & Symptoms: Blood in stools. Was seen in the toilet with the BM and also when wiping. Patient reports she had to "help herself" get a BM out a few days ago after having constipation.  Reviewed Health History In EMR: Yes  Reviewed Medications In EMR: Yes  Reviewed Allergies In EMR: Yes  Reviewed Surgeries / Procedures: Yes  Date of Onset of Symptoms: 09/24/2013  Guideline(s) Used:  Rectal Bleeding  Disposition Per Guideline:   See Today in Office  Reason For Disposition Reached:   All other patients with rectal bleeding (Exceptions: blood just on toilet paper, few drops, streaks on surface of normal formed BM)  Advice Given:  N/A  Patient Will Follow Care Advice:  YES  Appointment Scheduled:  09/25/2013 11:45:00 Appointment Scheduled Provider:  Loura Pardon [facility])

## 2013-09-25 NOTE — Progress Notes (Signed)
Pre visit review using our clinic review tool, if applicable. No additional management support is needed unless otherwise documented below in the visit note. 

## 2013-09-25 NOTE — Telephone Encounter (Signed)
Patient coming in today  

## 2013-09-25 NOTE — Patient Instructions (Signed)
I think you have had bleeding from and internal hemorrhoid  This is from straining with constipation  To keep constipation under control- use a stool softener up to twice daily or miralax (over the counter)- as directed daily  Use the anusol hc suppository rectally each bedtime for 10 days If no improvement or if bleeding worsens-update me so I can refer you to GI

## 2013-09-25 NOTE — Assessment & Plan Note (Signed)
This happened after urgent bm after straining with chronic constipation  Recommend use of stool softener and or miralax daily Fiber and fluids anusol hc suppository each bedtime for 10 d Update if worse or not imp (or new abd sympt)-would consider GI ref  Is due for colonosc in 1 year for f/u of polyps

## 2013-09-25 NOTE — Progress Notes (Signed)
Subjective:    Patient ID: [name], female    DOB: 1940/10/03, 73 y.o.   MRN: [id number]  HPI Here with rectal bleeding   She had urge to have bm mid day  Then got nauseated  Vomited outside of the car  Passed stool- with bright red blood (that preceded the stool) Then had diarrhea several times / vomited a few more times   Usually has to strain to have a BM- stays constipated  Has a rectocele - she has to push on it to get bowels moving   Feels much better today  Stomach is gurgly - she was able to eat peaches for breakfast   Last ate small cheeseburger and milk shake before this happened - ? If dairy bothers her   Patient Active Problem List   Diagnosis Date Noted  . Joint pain 05/01/2013  . Tick bite of multiple sites 05/01/2013  . Pain in joint, ankle and foot 03/02/2013  . Seborrheic keratosis 07/05/2012  . Ankle joint pain 07/16/2011  . 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 Callus of foot 10/16/2010  . Coronary artery disease, non-occlusive 10/16/2010  . INSOMNIA 02/02/2010  . HAND PAIN, BILATERAL 01/09/2010  . CRAMP OF LIMB 01/09/2010  . HYPERGLYCEMIA 07/29/2009  . ALLERGIC RHINITIS 12/04/2008  . UNSPECIFIED VITAMIN D DEFICIENCY 04/30/2008  . COLONIC POLYPS 06/28/2007  . GERD 06/28/2007  . HYPERLIPIDEMIA 03/17/2007  . IBS 03/17/2007  . INTERSTITIAL CYSTITIS 03/17/2007  . OSTEOPOROSIS 03/17/2007   Past Medical History  Diagnosis Date  . Hyperlipidemia   . Osteoporosis   . IC (interstitial cystitis)   . Heel spur   . IBS (irritable bowel syndrome)   . GERD (gastroesophageal reflux disease)   . Hyperglycemia   . Vitamin D deficiency   . Insomnia disorder related to known organic factor   . Coronary artery disease, non-occlusive   . HSV (herpes simplex virus) infection    Past Surgical History  Procedure Laterality Date  . Rotator cuff repair      2 TIMES  . Abdominal hysterectomy    . Cholecystectomy    . Cardiac catheterization  7/12    non  obst dz   History  Substance Use Topics  . Smoking status: Never Smoker   . Smokeless tobacco: Not on file  . Alcohol Use: Yes     Comment: occ- wine   Family History  Problem Relation Age of Onset  . Alcohol abuse Mother   . Heart attack Mother   . GER disease Mother   . GER disease Father   . Alcohol abuse Father   . Stroke Father   . Tuberculosis Father   . Heart attack Father   . Aneurysm Father   . Cancer Sister     FEMALE  . Mental illness Sister   . Depression Sister    Allergies  Allergen Reactions  . Codeine     REACTION: nausea  . Meperidine Hcl     REACTION: nausea  . Penicillins     REACTION: hives  . Propoxyphene Hcl     REACTION: nausea  . Sulfonamide Derivatives     REACTION: hives  . Tramadol Hcl     REACTION: nausea and vomiting and felt like floating   Current Outpatient Prescriptions on File Prior to Visit  Medication Sig Dispense Refill  . aspirin 81 MG tablet Take 81 mg by mouth daily.        Marland Kitchen ibuprofen (ADVIL,MOTRIN) 800 MG tablet Take  1 tablet (800 mg total) by mouth every 8 (eight) hours as needed for moderate pain (with food).  90 tablet  2  . LUMIGAN 0.01 % SOLN Place 1 drop into both eyes at bedtime.      Marland Kitchen BESIVANCE 0.6 % SUSP        No current facility-administered medications on file prior to visit.      Review of Systems Review of Systems  Constitutional: Negative for fever, appetite change, fatigue and unexpected weight change.  Eyes: Negative for pain and visual disturbance.  Respiratory: Negative for cough and shortness of breath.   Cardiovascular: Negative for cp or palpitations    Gastrointestinal: Negative for n/v (today), diarrhea and pos for constipation. neg for abd pain  Genitourinary: Negative for urgency and frequency.  Skin: Negative for pallor or rash   Neurological: Negative for weakness, light-headedness, numbness and headaches.  Hematological: Negative for adenopathy. Does not bruise/bleed easily.    Psychiatric/Behavioral: Negative for dysphoric mood. The patient is not nervous/anxious.         Objective:   Physical Exam  Constitutional: She appears well-developed and well-nourished. No distress.  HENT:  Head: Normocephalic and atraumatic.  Eyes: Conjunctivae and EOM are normal. Pupils are equal, round, and reactive to light. No scleral icterus.  Neck: Normal range of motion. Neck supple.  Cardiovascular: Normal rate and regular rhythm.   Pulmonary/Chest: Effort normal and breath sounds normal.  Abdominal: Soft. Bowel sounds are normal. She exhibits no distension and no mass. There is no tenderness. There is no rebound and no guarding.  Genitourinary: Rectal exam shows external hemorrhoid. Rectal exam shows no fissure, no mass, no tenderness and anal tone normal. Guaiac positive stool.  Trace heme pos stool  No tenderness on rectal exam and no mass felt   Lymphadenopathy:    She has no cervical adenopathy.  Neurological: She is alert.  Skin: Skin is warm and dry. No rash noted. No erythema. No pallor.  Psychiatric: She has a normal mood and affect.          Assessment & Plan:   Problem List Items Addressed This Visit     Digestive   Rectal bleeding - Primary     This happened after urgent bm after straining with chronic constipation  Recommend use of stool softener and or miralax daily Fiber and fluids anusol hc suppository each bedtime for 10 d Update if worse or not imp (or new abd sympt)-would consider GI ref  Is due for colonosc in 1 year for f/u of polyps

## 2013-09-25 NOTE — Telephone Encounter (Signed)
I will see her as planned

## 2013-09-26 ENCOUNTER — Ambulatory Visit: Payer: Medicare HMO | Admitting: Family Medicine

## 2013-12-28 ENCOUNTER — Encounter: Payer: Self-pay | Admitting: Family Medicine

## 2013-12-28 ENCOUNTER — Ambulatory Visit (INDEPENDENT_AMBULATORY_CARE_PROVIDER_SITE_OTHER): Payer: Medicare HMO | Admitting: Family Medicine

## 2013-12-28 VITALS — BP 110/68 | HR 70 | Temp 97.8°F | Ht 64.0 in | Wt 167.5 lb

## 2013-12-28 DIAGNOSIS — R350 Frequency of micturition: Secondary | ICD-10-CM | POA: Insufficient documentation

## 2013-12-28 DIAGNOSIS — L84 Corns and callosities: Secondary | ICD-10-CM

## 2013-12-28 DIAGNOSIS — M79643 Pain in unspecified hand: Secondary | ICD-10-CM

## 2013-12-28 LAB — POCT URINALYSIS DIPSTICK
Bilirubin, UA: NEGATIVE
Blood, UA: NEGATIVE
Glucose, UA: NEGATIVE
Ketones, UA: NEGATIVE
Leukocytes, UA: NEGATIVE
Nitrite, UA: NEGATIVE
Protein, UA: NEGATIVE
Spec Grav, UA: 1.015
Urobilinogen, UA: 0.2
pH, UA: 6

## 2013-12-28 NOTE — Assessment & Plan Note (Signed)
Could not give sample Will bring one back later today  Hx of IC

## 2013-12-28 NOTE — Assessment & Plan Note (Signed)
Pared down calluses on L foot today with sterile scalpel and relief  Will continue to follow

## 2013-12-28 NOTE — Assessment & Plan Note (Signed)
Ref to hand specialist  Problems with thumb/tendonitis and triggering in the past L  Generalized pain now bilat

## 2013-12-28 NOTE — Progress Notes (Signed)
Subjective:    Patient ID: [name], female    DOB: [birth year], 73 y.o.   MRN: [id number]  HPI Here for urinary symptoms and hand pain and foot calluses   Urination- has a hx of IC - is getting up to urinate all night  Has an irritative feeling - but no burning to urinate  Probably does not drink enough water  She does eat chocolate - ? Caffeine  Some tea occasionally and occ lemonade (did have some this week)   No fever or nausea  No blood in urine  Does feel bloated a bit   Pain in hands - has arthritis  In the past she had a wrist splint to stabilize L thumb - helped  Just a little  Pain in both hands (worse on L) keeps her up at night  She is interested in a referral to hand specialist   She would like me to shave some calluses from her feet today  Patient Active Problem List   Diagnosis Date Noted  . Urine frequency 12/28/2013  . Rectal bleeding 09/25/2013  . Joint pain 05/01/2013  . Tick bite of multiple sites 05/01/2013  . Pain in joint, ankle and foot 03/02/2013  . Seborrheic keratosis 07/05/2012  . Ankle joint pain 07/16/2011  . 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 Callus of foot 10/16/2010  . Coronary artery disease, non-occlusive 10/16/2010  . INSOMNIA 02/02/2010  . HAND PAIN, BILATERAL 01/09/2010  . CRAMP OF LIMB 01/09/2010  . HYPERGLYCEMIA 07/29/2009  . ALLERGIC RHINITIS 12/04/2008  . UNSPECIFIED VITAMIN D DEFICIENCY 04/30/2008  . COLONIC POLYPS 06/28/2007  . GERD 06/28/2007  . HYPERLIPIDEMIA 03/17/2007  . IBS 03/17/2007  . INTERSTITIAL CYSTITIS 03/17/2007  . OSTEOPOROSIS 03/17/2007   Past Medical History  Diagnosis Date  . Hyperlipidemia   . Osteoporosis   . IC (interstitial cystitis)   . Heel spur   . IBS (irritable bowel syndrome)   . GERD (gastroesophageal reflux disease)   . Hyperglycemia   . Vitamin D deficiency   . Insomnia disorder related to known organic factor   . Coronary artery disease, non-occlusive   . HSV (herpes simplex  virus) infection    Past Surgical History  Procedure Laterality Date  . Rotator cuff repair      2 TIMES  . Abdominal hysterectomy    . Cholecystectomy    . Cardiac catheterization  7/12    non obst dz   History  Substance Use Topics  . Smoking status: Never Smoker   . Smokeless tobacco: Not on file  . Alcohol Use: Yes     Comment: occ- wine   Family History  Problem Relation Age of Onset  . Alcohol abuse Mother   . Heart attack Mother   . GER disease Mother   . GER disease Father   . Alcohol abuse Father   . Stroke Father   . Tuberculosis Father   . Heart attack Father   . Aneurysm Father   . Cancer Sister     FEMALE  . Mental illness Sister   . Depression Sister    Allergies  Allergen Reactions  . Codeine     REACTION: nausea  . Meperidine Hcl     REACTION: nausea  . Penicillins     REACTION: hives  . Propoxyphene Hcl     REACTION: nausea  . Sulfonamide Derivatives     REACTION: hives  . Tramadol Hcl     REACTION: nausea and vomiting and  felt like floating   Current Outpatient Prescriptions on File Prior to Visit  Medication Sig Dispense Refill  . aspirin 81 MG tablet Take 81 mg by mouth daily.      Marland Kitchen BESIVANCE 0.6 % SUSP     . brimonidine (ALPHAGAN P) 0.1 % SOLN Apply 1 drop to eye every morning.    . hydrocortisone (ANUSOL-HC) 25 MG suppository Place 1 suppository (25 mg total) rectally at bedtime. 10 suppository 0  . ibuprofen (ADVIL,MOTRIN) 800 MG tablet Take 1 tablet (800 mg total) by mouth every 8 (eight) hours as needed for moderate pain (with food). 90 tablet 2  . LUMIGAN 0.01 % SOLN Place 1 drop into both eyes at bedtime.     No current facility-administered medications on file prior to visit.     Review of Systems Review of Systems  Constitutional: Negative for fever, appetite change, fatigue and unexpected weight change.  Eyes: Negative for pain and visual disturbance.  Respiratory: Negative for cough and shortness of breath.     Cardiovascular: Negative for cp or palpitations    Gastrointestinal: Negative for nausea, diarrhea and constipation.  Genitourinary: pos for urgency and frequency. neg for hematuria Skin: Negative for pallor or rash  pos for painful calluses on L foot  MSK pos for hand pain bilaterally  Neurological: Negative for weakness, light-headedness, numbness and headaches.  Hematological: Negative for adenopathy. Does not bruise/bleed easily.  Psychiatric/Behavioral: Negative for dysphoric mood. The patient is not nervous/anxious.         Objective:   Physical Exam  Constitutional: She appears well-developed and well-nourished. No distress.  HENT:  Head: Normocephalic and atraumatic.  Mouth/Throat: Oropharynx is clear and moist.  Eyes: Conjunctivae and EOM are normal. Pupils are equal, round, and reactive to light. Right eye exhibits no discharge. Left eye exhibits no discharge. No scleral icterus.  Neck: Normal range of motion. Neck supple.  Cardiovascular: Normal rate, regular rhythm and intact distal pulses.  Exam reveals no gallop.   Pulmonary/Chest: Effort normal and breath sounds normal. No respiratory distress. She has no wheezes. She has no rales.  Abdominal: Soft. Bowel sounds are normal.  Musculoskeletal: She exhibits tenderness. She exhibits no edema.  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  Neurological: She is alert. She has normal reflexes. No cranial nerve deficit. She exhibits normal muscle tone. Coordination normal.  Skin: Skin is warm and dry. No rash noted. No erythema. No pallor.  Plantar calluses on L foot debrided today  Psychiatric: She has a normal mood and affect.          Assessment & Plan:   Problem List Items Addressed This Visit      Musculoskeletal and Integument   Callus of foot    Pared down calluses on L  foot today with sterile scalpel and relief  Will continue to follow      Other   HAND PAIN, BILATERAL    Ref to hand specialist  Problems with thumb/tendonitis and triggering in the past L  Generalized pain now bilat     Relevant Orders      Ambulatory referral to Orthopedic Surgery   Urine frequency    Could not give sample Will bring one back later today  Hx of IC      Other Visit Diagnoses    Urinary frequency    -  Primary    Relevant Orders       POCT urinalysis dipstick (Completed)

## 2013-12-28 NOTE — Progress Notes (Signed)
Pre visit review using our clinic review tool, if applicable. No additional management support is needed unless otherwise documented below in the visit note. 

## 2013-12-28 NOTE — Patient Instructions (Signed)
Stop at check out for hand doctor referral  We worked on calluses today  Bring a urine sample back today so we can run it

## 2013-12-31 ENCOUNTER — Telehealth: Payer: Self-pay

## 2013-12-31 NOTE — Telephone Encounter (Signed)
Let her know urine was negative

## 2013-12-31 NOTE — Telephone Encounter (Signed)
Pt lft vm requesting cb with results of urine specimen. Pt does not have future appt scheduled.

## 2013-12-31 NOTE — Telephone Encounter (Signed)
Pt.notified

## 2014-02-22 DIAGNOSIS — I639 Cerebral infarction, unspecified: Secondary | ICD-10-CM

## 2014-02-22 HISTORY — DX: Cerebral infarction, unspecified: I63.9

## 2014-04-16 ENCOUNTER — Telehealth: Payer: Self-pay | Admitting: Family Medicine

## 2014-04-16 NOTE — Telephone Encounter (Signed)
Patient Name: [name] DOB: 06-Jul-1940 Initial Comment Caller states she stepped on a nail. Nurse Assessment Nurse: Marcelline Deist, RN, Lynda Date/Time (Eastern Time): 04/16/2014 10:13:27 AM Confirm and document reason for call. If symptomatic, describe symptoms. ---Caller states she stepped on a nail. It went through her shoe & into pad of big toe on left foot. It went in about 1/4" - 1/2". The nail was not rusty, but it was sticking up out of a board outside. Cleaned it with Betadine. Put an antibiotic ointment in puncture wound. Covered it with a piece of gauze & duct tape. Last Tetanus was in 2010. Has the patient traveled out of the country within the last 30 days? ---Not Applicable Does the patient require triage? ---Yes Related visit to physician within the last 2 weeks? ---No Does the PT have any chronic conditions? (i.e. diabetes, asthma, etc.) ---Yes List chronic conditions. ---genital herpes Guidelines Guideline Title Affirmed Question Affirmed Notes Puncture Wound [1] Last tetanus shot > 5 years ago AND [2] DIRTY puncture (e.g., object OR skin was dirty, objects on ground/floor) Final Disposition User See Physician within [place], RN, Kermit Balo Comments Caller would like to wait & see if she should get a Tetanus booster since she had one in 2010. The nail was not rusty, although it was outside sticking from a board. Patient cleaned the wound immediately. This is the best way to reach patient. If her Dr. could let her know whether she should get one or not, she would appreciate it.

## 2014-04-16 NOTE — Telephone Encounter (Signed)
Thanks

## 2014-04-16 NOTE — Telephone Encounter (Signed)
Spoken to patient. Was able to schedule her for a follow up on 04/19/14 at 11:45. Is that ok? It was the first available that patient agreed to or should try another provider?

## 2014-04-16 NOTE — Telephone Encounter (Signed)
I would rather she did not wait that long - want to check for infection and give her the tetanus shot  Can you put her in with first avail?

## 2014-04-16 NOTE — Telephone Encounter (Signed)
She needs to f/u for a tetanus shot since it has been over 5 y - also would like to check wound

## 2014-04-16 NOTE — Telephone Encounter (Signed)
Spoken to patient. She agree to see another provider. She has appt with Dr Deborra Medina at 10:45 tommorow

## 2014-04-17 ENCOUNTER — Encounter: Payer: Self-pay | Admitting: Family Medicine

## 2014-04-17 ENCOUNTER — Ambulatory Visit (INDEPENDENT_AMBULATORY_CARE_PROVIDER_SITE_OTHER): Payer: Medicare HMO | Admitting: Family Medicine

## 2014-04-17 VITALS — BP 110/62 | HR 72 | Temp 97.9°F | Wt 162.5 lb

## 2014-04-17 DIAGNOSIS — Z23 Encounter for immunization: Secondary | ICD-10-CM | POA: Diagnosis not present

## 2014-04-17 DIAGNOSIS — S91312A Laceration without foreign body, left foot, initial encounter: Secondary | ICD-10-CM | POA: Diagnosis not present

## 2014-04-17 NOTE — Assessment & Plan Note (Signed)
New- no signs of infection. Advised to keep area clean.  Keep covered while working outside.  Td booster given today. Call or return to clinic prn if these symptoms worsen or fail to improve as anticipated. The patient indicates understanding of these issues and agrees with the plan.

## 2014-04-17 NOTE — Progress Notes (Signed)
Pre visit review using our clinic review tool, if applicable. No additional management support is needed unless otherwise documented below in the visit note. 

## 2014-04-17 NOTE — Addendum Note (Signed)
Addended by: Modena Nunnery on: 04/17/2014 11:36 AM   Modules accepted: Orders

## 2014-04-17 NOTE — Progress Notes (Signed)
Subjective:   Patient ID: [name], female    DOB: 07-Aug-1940, 74 y.o.   MRN: [id number]  [name] is a pleasant 74 y.o. year old female pt of [name], new to me, who presents to clinic today with Wound Check  on 04/17/2014  HPI: Was outside feeding her horses in the rain yesterday and stepped on a nail that was standing straight up in a piece of wood. Immediately pulled her foot out and it did not bleed much.  Since then, has kept it covered with gauze and antibiotic ointment. No fevers, chills or drainage.  Td 04/30/08  Current Outpatient Prescriptions on File Prior to Visit  Medication Sig Dispense Refill  . aspirin 81 MG tablet Take 81 mg by mouth daily.      Marland Kitchen BESIVANCE 0.6 % SUSP     . brimonidine (ALPHAGAN P) 0.1 % SOLN Apply 1 drop to eye every morning.    . hydrocortisone (ANUSOL-HC) 25 MG suppository Place 1 suppository (25 mg total) rectally at bedtime. 10 suppository 0  . ibuprofen (ADVIL,MOTRIN) 800 MG tablet Take 1 tablet (800 mg total) by mouth every 8 (eight) hours as needed for moderate pain (with food). 90 tablet 2  . LUMIGAN 0.01 % SOLN Place 1 drop into both eyes at bedtime.    . Multiple Vitamins-Minerals (HAIR/SKIN/NAILS PO) Take 1 tablet by mouth.     No current facility-administered medications on file prior to visit.    Allergies  Allergen Reactions  . Codeine     REACTION: nausea  . Meperidine Hcl     REACTION: nausea  . Penicillins     REACTION: hives  . Propoxyphene Hcl     REACTION: nausea  . Sulfonamide Derivatives     REACTION: hives  . Tramadol Hcl     REACTION: nausea and vomiting and felt like floating    Past Medical History  Diagnosis Date  . Hyperlipidemia   . Osteoporosis   . IC (interstitial cystitis)   . Heel spur   . IBS (irritable bowel syndrome)   . GERD (gastroesophageal reflux disease)   . Hyperglycemia   . Vitamin D deficiency   . Insomnia disorder related to known organic factor   . Coronary  artery disease, non-occlusive   . HSV (herpes simplex virus) infection     Past Surgical History  Procedure Laterality Date  . Rotator cuff repair      2 TIMES  . Abdominal hysterectomy    . Cholecystectomy    . Cardiac catheterization  7/12    non obst dz    Family History  Problem Relation Age of Onset  . Alcohol abuse Mother   . Heart attack Mother   . GER disease Mother   . GER disease Father   . Alcohol abuse Father   . Stroke Father   . Tuberculosis Father   . Heart attack Father   . Aneurysm Father   . Cancer Sister     FEMALE  . Mental illness Sister   . Depression Sister     History   Social History  . Marital Status: Married    Spouse Name: N/A  . Number of Children: 4  . Years of Education: N/A   Occupational History  . reitred    Social History Main Topics  . Smoking status: Never Smoker   . Smokeless tobacco: Not on file  . Alcohol Use: Yes     Comment: occ- wine  .  Drug Use: No  . Sexual Activity: Not on file   Other Topics Concern  . Not on file   Social History Narrative   Rides horses         Specialty providers    [facility]   Cardio- [place]   Ortho--[place]   Rheum-[facility]   urol-[facility]   [facility]-- [facility]   The PMH, PSH, Social History, Family History, Medications, and allergies have been reviewed in [facility], and have been updated if relevant.   Review of Systems  Constitutional: Negative.   Skin: Positive for wound.  Neurological: Negative.   Hematological: Does not bruise/bleed easily.  All other systems reviewed and are negative.      Objective:    BP 110/62 mmHg  Pulse 72  Temp(Src) 97.9 F (36.6 C) (Oral)  Wt 162 lb 8 oz (73.71 kg)  SpO2 97%   Physical Exam  Constitutional: She is oriented to person, place, and time. She appears well-developed and well-nourished. No distress.  HENT:  Head: Normocephalic.  Eyes: Conjunctivae are normal.  Cardiovascular: Normal rate.   Pulmonary/Chest: Effort normal.    Neurological: She is alert and oriented to person, place, and time. No cranial nerve deficit.  Skin:     Psychiatric: She has a normal mood and affect. Her behavior is normal. Judgment and thought content normal.          Assessment & Plan:   Laceration of foot, left, initial encounter No Follow-up on file.

## 2014-04-19 ENCOUNTER — Ambulatory Visit: Payer: Medicare HMO | Admitting: Family Medicine

## 2014-04-24 DIAGNOSIS — H26492 Other secondary cataract, left eye: Secondary | ICD-10-CM | POA: Diagnosis not present

## 2014-04-24 DIAGNOSIS — H4011X3 Primary open-angle glaucoma, severe stage: Secondary | ICD-10-CM | POA: Diagnosis not present

## 2014-07-02 ENCOUNTER — Encounter: Payer: Self-pay | Admitting: Primary Care

## 2014-07-02 ENCOUNTER — Ambulatory Visit (INDEPENDENT_AMBULATORY_CARE_PROVIDER_SITE_OTHER): Payer: Medicare HMO | Admitting: Primary Care

## 2014-07-02 VITALS — BP 116/64 | HR 67 | Temp 98.2°F | Ht 64.0 in | Wt 161.0 lb

## 2014-07-02 DIAGNOSIS — R05 Cough: Secondary | ICD-10-CM

## 2014-07-02 DIAGNOSIS — R059 Cough, unspecified: Secondary | ICD-10-CM

## 2014-07-02 MED ORDER — BENZONATATE 200 MG PO CAPS
200.0000 mg | ORAL_CAPSULE | Freq: Three times a day (TID) | ORAL | Status: DC | PRN
Start: 2014-07-02 — End: 2014-09-17

## 2014-07-02 MED ORDER — DOXYCYCLINE HYCLATE 100 MG PO TABS: 100.0000 mg | ORAL_TABLET | Freq: Two times a day (BID) | ORAL | Status: DC

## 2014-07-02 NOTE — Patient Instructions (Signed)
Start Doxycycline antibiotics. Take 1 tablet by mouth twice daily for 10 days. You may take the Tessalon Pearls three times daily as needed for cough. You should start to feel better in the next 3-4 days. Call if no improvement or if you develop shortness of breath, chest pain, or worsening cough. It was nice meeting you!

## 2014-07-02 NOTE — Progress Notes (Signed)
Subjective:    Patient ID: [name], female    DOB: Jan 19, 1941, 73 y.o.   MRN: [id number]  HPI  [name] is a 74 year old female who presents today with a chief complaint of cough. Her cough is non productive and has been present for 2 weeks. She reports fever, chills, sinus congestion, and chest congestion. Denies sore throat and nausea. She's been taking a "decongestant" OTC which has helped. Since symptoms began 2 weeks ago she's feeling as though her symptoms have not improved.   Review of Systems  Constitutional: Negative for fever and chills.  HENT: Positive for congestion, ear pain, postnasal drip and sinus pressure. Negative for sore throat.   Respiratory: Positive for cough. Negative for shortness of breath.   Cardiovascular: Negative for chest pain.  Gastrointestinal: Negative for nausea and vomiting.  Musculoskeletal: Negative for myalgias.  Neurological:       Some dizziness.       Past Medical History  Diagnosis Date  . Hyperlipidemia   . Osteoporosis   . IC (interstitial cystitis)   . Heel spur   . IBS (irritable bowel syndrome)   . GERD (gastroesophageal reflux disease)   . Hyperglycemia   . Vitamin D deficiency   . Insomnia disorder related to known organic factor   . Coronary artery disease, non-occlusive   . HSV (herpes simplex virus) infection     History   Social History  . Marital Status: Married    Spouse Name: N/A  . Number of Children: 4  . Years of Education: N/A   Occupational History  . reitred    Social History Main Topics  . Smoking status: Never Smoker   . Smokeless tobacco: Not on file  . Alcohol Use: Yes     Comment: occ- wine  . Drug Use: No  . Sexual Activity: Not on file   Other Topics Concern  . Not on file   Social History Narrative   Rides horses         Specialty providers    [facility]   Cardio- [place]   R[facility]   [facility]   ENT-- [facility]    Past Surgical  History  Procedure Laterality Date  . Rotator cuff repair      2 TIMES  . Abdominal hysterectomy    . Cholecystectomy    . Cardiac catheterization  7/12    non obst dz    Family History  Problem Relation Age of Onset  . Alcohol abuse Mother   . Heart attack Mother   . GER disease Mother   . GER disease Father   . Alcohol abuse Father   . Stroke Father   . Tuberculosis Father   . Heart attack Father   . Aneurysm Father   . Cancer Sister     FEMALE  . Mental illness Sister   . Depression Sister     Allergies  Allergen Reactions  . Codeine     REACTION: nausea  . Meperidine Hcl     REACTION: nausea  . Penicillins     REACTION: hives  . Propoxyphene Hcl     REACTION: nausea  . Sulfonamide Derivatives     REACTION: hives  . Tramadol Hcl     REACTION: nausea and vomiting and felt like floating    Current Outpatient Prescriptions on File Prior to Visit  Medication Sig Dispense Refill  . aspirin 81 MG tablet Take 81 mg by mouth  daily.      Marland Kitchen BESIVANCE 0.6 % SUSP     . brimonidine (ALPHAGAN P) 0.1 % SOLN Apply 1 drop to eye every morning.    . hydrocortisone (ANUSOL-HC) 25 MG suppository Place 1 suppository (25 mg total) rectally at bedtime. 10 suppository 0  . LUMIGAN 0.01 % SOLN Place 1 drop into both eyes at bedtime.    . Multiple Vitamins-Minerals (HAIR/SKIN/NAILS PO) Take 1 tablet by mouth.     No current facility-administered medications on file prior to visit.    BP 116/64 mmHg  Pulse 67  Temp(Src) 98.2 F (36.8 C) (Oral)  Ht 5\' 4"  (1.626 m)  Wt 161 lb (73.029 kg)  BMI 27.62 kg/m2  SpO2 97%    Objective:   Physical Exam  Constitutional: She is oriented to person, place, and time.  HENT:  Right Ear: Tympanic membrane and ear canal normal.  Left Ear: Tympanic membrane and ear canal normal.  Nose: Nose normal.  Mouth/Throat: Oropharynx is clear and moist.  Eyes: Conjunctivae are normal. Pupils are equal, round, and reactive to light.  Neck: Neck  supple.  Cardiovascular: Normal rate and regular rhythm.   Pulmonary/Chest: Effort normal. She has rhonchi in the right upper field.  Lymphadenopathy:    She has no cervical adenopathy.  Neurological: She is alert and oriented to person, place, and time.  Skin: Skin is warm and dry.          Assessment & Plan:  Bronchitis:  Due to duration without improvement of symptoms and age will treat with antibiotics. Doxycycline BID for 10 days. Tessalon pearls PRN for cough. Some rhonchi noted to RUF, do no suspect pneumonia. Follow up if no improvement in 3-4 days.

## 2014-07-02 NOTE — Progress Notes (Signed)
Pre visit review using our clinic review tool, if applicable. No additional management support is needed unless otherwise documented below in the visit note. 

## 2014-07-31 DIAGNOSIS — H4011X3 Primary open-angle glaucoma, severe stage: Secondary | ICD-10-CM | POA: Diagnosis not present

## 2014-07-31 DIAGNOSIS — H2513 Age-related nuclear cataract, bilateral: Secondary | ICD-10-CM | POA: Diagnosis not present

## 2014-08-14 DIAGNOSIS — H04123 Dry eye syndrome of bilateral lacrimal glands: Secondary | ICD-10-CM | POA: Diagnosis not present

## 2014-08-14 DIAGNOSIS — H2511 Age-related nuclear cataract, right eye: Secondary | ICD-10-CM | POA: Diagnosis not present

## 2014-08-14 DIAGNOSIS — H524 Presbyopia: Secondary | ICD-10-CM | POA: Diagnosis not present

## 2014-08-14 DIAGNOSIS — H4011X3 Primary open-angle glaucoma, severe stage: Secondary | ICD-10-CM | POA: Diagnosis not present

## 2014-09-17 ENCOUNTER — Ambulatory Visit (INDEPENDENT_AMBULATORY_CARE_PROVIDER_SITE_OTHER): Payer: Commercial Managed Care - HMO | Admitting: Family Medicine

## 2014-09-17 ENCOUNTER — Encounter: Payer: Self-pay | Admitting: Family Medicine

## 2014-09-17 VITALS — BP 142/82 | HR 62 | Temp 98.7°F | Ht 64.0 in | Wt 157.0 lb

## 2014-09-17 DIAGNOSIS — W57XXXA Bitten or stung by nonvenomous insect and other nonvenomous arthropods, initial encounter: Secondary | ICD-10-CM | POA: Insufficient documentation

## 2014-09-17 DIAGNOSIS — T148 Other injury of unspecified body region: Secondary | ICD-10-CM | POA: Diagnosis not present

## 2014-09-17 MED ORDER — MOMETASONE FUROATE 0.1 % EX CREA: 1.0000 "application " | TOPICAL_CREAM | Freq: Every day | CUTANEOUS | Status: DC

## 2014-09-17 MED ORDER — PERMETHRIN 5 % EX CREA: 1.0000 "application " | TOPICAL_CREAM | Freq: Once | CUTANEOUS | Status: DC

## 2014-09-17 NOTE — Assessment & Plan Note (Signed)
Chiggers and scabies are both in the differential  Did see a few papules in web spaces on exam  Disc protection when working outdoors  Will tx with permetherin times one for poss scabies  Elocon cream after that prn itch Benadryl  Also stressed imp of keeping lesions clean for infection prevention  Update if not starting to improve in 1-2 weeks  or if worsening

## 2014-09-17 NOTE — Progress Notes (Signed)
Subjective:    Patient ID: [name], female    DOB: 01-Aug-1940, 74 y.o.   MRN: [id number]  HPI Here with a rash  ? If insect bites  Perhaps chiggers   Is outdoors a lot and exposed to everything  Cares for horses and mows   Arms and legs  About 4 days  No fever or other symptoms   Very itchy- scratched into scabs  Taking benadryl - helped a bit -now she takes it at night   Never had poison ivy before  No exposure to scabies    No armpit rash  A few lesions in her groin  No rash on palms or soles  No rash in web spaces   Did have one tick bite on abdomen - tiny red spot/no target rash   Has a dog in the house (big dog that was previously not well cared for)  Never seen fleas on the dog -but he does chew and scratch    Patient Active Problem List   Diagnosis Date Noted  . Laceration of foot, left 04/17/2014  . Urine frequency 12/28/2013  . Rectal bleeding 09/25/2013  . Joint pain 05/01/2013  . Tick bite of multiple sites 05/01/2013  . Pain in joint, ankle and foot 03/02/2013  . Seborrheic keratosis 07/05/2012  . Ankle joint pain 07/16/2011  . 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 Callus of foot 10/16/2010  . Coronary artery disease, non-occlusive 10/16/2010  . INSOMNIA 02/02/2010  . HAND PAIN, BILATERAL 01/09/2010  . CRAMP OF LIMB 01/09/2010  . HYPERGLYCEMIA 07/29/2009  . ALLERGIC RHINITIS 12/04/2008  . UNSPECIFIED VITAMIN D DEFICIENCY 04/30/2008  . COLONIC POLYPS 06/28/2007  . GERD 06/28/2007  . HYPERLIPIDEMIA 03/17/2007  . IBS 03/17/2007  . INTERSTITIAL CYSTITIS 03/17/2007  . OSTEOPOROSIS 03/17/2007   Past Medical History  Diagnosis Date  . Hyperlipidemia   . Osteoporosis   . IC (interstitial cystitis)   . Heel spur   . IBS (irritable bowel syndrome)   . GERD (gastroesophageal reflux disease)   . Hyperglycemia   . Vitamin D deficiency   . Insomnia disorder related to known organic factor   . Coronary artery disease, non-occlusive   . HSV  (herpes simplex virus) infection    Past Surgical History  Procedure Laterality Date  . Rotator cuff repair      2 TIMES  . Abdominal hysterectomy    . Cholecystectomy    . Cardiac catheterization  7/12    non obst dz   History  Substance Use Topics  . Smoking status: Never Smoker   . Smokeless tobacco: Not on file  . Alcohol Use: 0.0 oz/week    0 Standard drinks or equivalent per week     Comment: rare   Family History  Problem Relation Age of Onset  . Alcohol abuse Mother   . Heart attack Mother   . GER disease Mother   . GER disease Father   . Alcohol abuse Father   . Stroke Father   . Tuberculosis Father   . Heart attack Father   . Aneurysm Father   . Cancer Sister     FEMALE  . Mental illness Sister   . Depression Sister    Allergies  Allergen Reactions  . Codeine     REACTION: nausea  . Meperidine Hcl     REACTION: nausea  . Penicillins     REACTION: hives  . Propoxyphene Hcl     REACTION: nausea  . Sulfonamide  Derivatives     REACTION: hives  . Tramadol Hcl     REACTION: nausea and vomiting and felt like floating   Current Outpatient Prescriptions on File Prior to Visit  Medication Sig Dispense Refill  . aspirin 81 MG tablet Take 81 mg by mouth daily.      Marland Kitchen LUMIGAN 0.01 % SOLN Place 1 drop into both eyes at bedtime.    . Multiple Vitamins-Minerals (HAIR/SKIN/NAILS PO) Take 1 tablet by mouth.     No current facility-administered medications on file prior to visit.      Review of Systems Review of Systems  Constitutional: Negative for fever, appetite change, fatigue and unexpected weight change.  Eyes: Negative for pain and visual disturbance.  Respiratory: Negative for cough and shortness of breath.   Cardiovascular: Negative for cp or palpitations    Gastrointestinal: Negative for nausea, diarrhea and constipation.  Genitourinary: Negative for urgency and frequency.  Skin: Negative for pallor and pos for itching rash Neurological: Negative  for weakness, light-headedness, numbness and headaches.  Hematological: Negative for adenopathy. Does not bruise/bleed easily.  Psychiatric/Behavioral: Negative for dysphoric mood. The patient is not nervous/anxious.         Objective:   Physical Exam  Constitutional: She appears well-developed and well-nourished. No distress.  HENT:  Head: Normocephalic and atraumatic.  Eyes: Conjunctivae and EOM are normal. Pupils are equal, round, and reactive to light. Right eye exhibits no discharge. Left eye exhibits no discharge.  Neck: Normal range of motion. Neck supple.  Cardiovascular: Normal rate and regular rhythm.   Pulmonary/Chest: Effort normal and breath sounds normal. She has no wheezes.  Musculoskeletal: She exhibits no edema.  Lymphadenopathy:    She has no cervical adenopathy.  Neurological: She is alert.  Skin: Skin is warm and dry. Rash noted.  Excoriated papules diffusely on arms and legs (worse on the R side)  Few lesions in finger and toe web spaces  Mild erythema  No s/s of bacterial infection   Psychiatric: She has a normal mood and affect.          Assessment & Plan:   Problem List Items Addressed This Visit    Insect bites - Primary    Chiggers and scabies are both in the differential  Did see a few papules in web spaces on exam  Disc protection when working outdoors  Will tx with permetherin times one for poss scabies  Elocon cream after that prn itch Benadryl  Also stressed imp of keeping lesions clean for infection prevention  Update if not starting to improve in 1-2 weeks  or if worsening

## 2014-09-17 NOTE — Progress Notes (Signed)
Pre visit review using our clinic review tool, if applicable. No additional management support is needed unless otherwise documented below in the visit note. 

## 2014-09-17 NOTE — Patient Instructions (Signed)
Continue benadryl up to every 4 hours as needed for itching  Keep cool  Try the permethrin treatment as directed  Then keep rash very clean with soap and water  Use the steroid cream as needed for itch once daily  Do wash linens in hot water  Make sure animals in the house are treated for fleas   If no improvement in the next 1-2 weeks

## 2014-11-11 DIAGNOSIS — H2511 Age-related nuclear cataract, right eye: Secondary | ICD-10-CM | POA: Diagnosis not present

## 2014-11-11 DIAGNOSIS — H25811 Combined forms of age-related cataract, right eye: Secondary | ICD-10-CM | POA: Diagnosis not present

## 2015-02-10 ENCOUNTER — Telehealth: Payer: Self-pay | Admitting: Family Medicine

## 2015-02-10 ENCOUNTER — Ambulatory Visit: Payer: Commercial Managed Care - HMO | Admitting: Family Medicine

## 2015-02-10 NOTE — Telephone Encounter (Signed)
Pt did not come in for their appt today for acute visit. Please let me know if pt needs to be contacted immediately for follow up or no follow up needed. Best phone number to contact pt is [phone].

## 2015-02-10 NOTE — Telephone Encounter (Signed)
She was coming in for low bp - please check in with her -if she is ok -no f/u needed

## 2015-02-14 ENCOUNTER — Ambulatory Visit (INDEPENDENT_AMBULATORY_CARE_PROVIDER_SITE_OTHER): Payer: Commercial Managed Care - HMO | Admitting: Family Medicine

## 2015-02-14 ENCOUNTER — Encounter: Payer: Self-pay | Admitting: Family Medicine

## 2015-02-14 VITALS — BP 140/85 | HR 74 | Temp 98.1°F | Ht 64.0 in | Wt 168.0 lb

## 2015-02-14 DIAGNOSIS — IMO0001 Reserved for inherently not codable concepts without codable children: Secondary | ICD-10-CM

## 2015-02-14 DIAGNOSIS — R030 Elevated blood-pressure reading, without diagnosis of hypertension: Secondary | ICD-10-CM | POA: Diagnosis not present

## 2015-02-14 DIAGNOSIS — R7309 Other abnormal glucose: Secondary | ICD-10-CM | POA: Diagnosis not present

## 2015-02-14 DIAGNOSIS — E785 Hyperlipidemia, unspecified: Secondary | ICD-10-CM

## 2015-02-14 LAB — COMPREHENSIVE METABOLIC PANEL
ALT: 21 U/L (ref 0–35)
AST: 17 U/L (ref 0–37)
Albumin: 4 g/dL (ref 3.5–5.2)
Alkaline Phosphatase: 77 U/L (ref 39–117)
BUN: 20 mg/dL (ref 6–23)
CO2: 30 mEq/L (ref 19–32)
Calcium: 9.6 mg/dL (ref 8.4–10.5)
Chloride: 108 mEq/L (ref 96–112)
Creatinine, Ser: 0.97 mg/dL (ref 0.40–1.20)
GFR: 59.25 mL/min — ABNORMAL LOW (ref >60.00)
Glucose, Bld: 111 mg/dL — ABNORMAL HIGH (ref 70–99)
Potassium: 4.2 mEq/L (ref 3.5–5.1)
Sodium: 144 mEq/L (ref 135–145)
Total Bilirubin: 0.4 mg/dL (ref 0.2–1.2)
Total Protein: 6.7 g/dL (ref 6.0–8.3)

## 2015-02-14 LAB — LIPID PANEL
Cholesterol: 235 mg/dL — ABNORMAL HIGH (ref 0–200)
HDL: 68.9 mg/dL (ref >39.00)
LDL Cholesterol: 143 mg/dL — ABNORMAL HIGH (ref 0–99)
NonHDL: 165.67
Total CHOL/HDL Ratio: 3
Triglycerides: 114 mg/dL (ref 0.0–149.0)
VLDL: 22.8 mg/dL (ref 0.0–40.0)

## 2015-02-14 LAB — CBC WITH DIFFERENTIAL/PLATELET
Basophils Absolute: 0 10*3/uL (ref 0.0–0.1)
Basophils Relative: 0.5 % (ref 0.0–3.0)
Eosinophils Absolute: 0.1 10*3/uL (ref 0.0–0.7)
Eosinophils Relative: 2 % (ref 0.0–5.0)
HCT: 41.8 % (ref 36.0–46.0)
Hemoglobin: 13.6 g/dL (ref 12.0–15.0)
Lymphocytes Relative: 29.7 % (ref 12.0–46.0)
Lymphs Abs: 2.2 10*3/uL (ref 0.7–4.0)
MCHC: 32.4 g/dL (ref 30.0–36.0)
MCV: 90.6 fl (ref 78.0–100.0)
Monocytes Absolute: 0.6 10*3/uL (ref 0.1–1.0)
Monocytes Relative: 8.4 % (ref 3.0–12.0)
Neutro Abs: 4.3 10*3/uL (ref 1.4–7.7)
Neutrophils Relative %: 59.4 % (ref 43.0–77.0)
Platelets: 301 10*3/uL (ref 150.0–400.0)
RBC: 4.62 Mil/uL (ref 3.87–5.11)
RDW: 13.9 % (ref 11.5–15.5)
WBC: 7.3 10*3/uL (ref 4.0–10.5)

## 2015-02-14 LAB — TSH: TSH: 1.94 u[IU]/mL (ref 0.35–4.50)

## 2015-02-14 LAB — HEMOGLOBIN A1C: Hgb A1c MFr Bld: 5.9 % (ref 4.6–6.5)

## 2015-02-14 NOTE — Patient Instructions (Addendum)
Your blood pressure is a little elevated-we need to watch it Please try to eat less sodium/processed foods-see the DASH eating plan handout Also aim for at least 30 minutes per day (exercise)  Drink water and avoid other drinks Lab today  Follow up in 4-6 weeks
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
## 2015-02-14 NOTE — Progress Notes (Signed)
Pre visit review using our clinic review tool, if applicable. No additional management support is needed unless otherwise documented below in the visit note. 

## 2015-02-14 NOTE — Telephone Encounter (Signed)
Pt is coming in today for her appt.

## 2015-02-17 NOTE — Assessment & Plan Note (Signed)
A1C with labs today  Pt states she has had episodes of thirst in the past No wt change Disc low glycemic diet to prevent DM

## 2015-02-17 NOTE — Assessment & Plan Note (Signed)
Lipid panel with labs Disc goals for lipids and reasons to control them Rev low sat fat diet in detail

## 2015-02-17 NOTE — Assessment & Plan Note (Signed)
Will continue to follow  Your blood pressure is a little elevated-we need to watch it Please try to eat less sodium/processed foods-see the DASH eating plan handout Also aim for at least 30 minutes per day (exercise)  Drink water and avoid other drinks Lab today  Follow up in 4-6 weeks

## 2015-02-18 ENCOUNTER — Encounter: Payer: Self-pay | Admitting: *Deleted

## 2015-02-21 ENCOUNTER — Encounter: Payer: Self-pay | Admitting: Family Medicine

## 2015-02-21 ENCOUNTER — Ambulatory Visit (INDEPENDENT_AMBULATORY_CARE_PROVIDER_SITE_OTHER): Payer: Commercial Managed Care - HMO | Admitting: Family Medicine

## 2015-02-21 VITALS — BP 132/60 | HR 57 | Temp 98.2°F | Wt 165.8 lb

## 2015-02-21 DIAGNOSIS — J0100 Acute maxillary sinusitis, unspecified: Secondary | ICD-10-CM | POA: Diagnosis not present

## 2015-02-21 MED ORDER — DOXYCYCLINE HYCLATE 100 MG PO TABS: 100.0000 mg | ORAL_TABLET | Freq: Two times a day (BID) | ORAL | Status: DC

## 2015-02-21 NOTE — Patient Instructions (Signed)
Try to get some rest, drink plenty of fluids and start doxy.  Take care.  Glad to see you.

## 2015-02-21 NOTE — Progress Notes (Signed)
Pre visit review using our clinic review tool, if applicable. No additional management support is needed unless otherwise documented below in the visit note.  Sx started about 1 week ago.  Sick contacts noted.  Facial pain and stuffy.  ST.  No known temps >100.  Some sputum, cough productive after starting mucinex.  Discolored sputum.  occ wheeze noted.  Cough worse at night.  Never has had to use inhalers per patient report.  Taking lozenges in the meantime.  She feels like she getting worse each day.    Meds, vitals, and allergies reviewed.   ROS: See HPI.  Otherwise, noncontributory.   GEN: nad, alert and oriented HEENT: mucous membranes moist, tm w/o erythema, nasal exam w/o erythema, clear discharge noted,  OP with cobblestoning, max sinuses slightly ttp B NECK: supple w/o LA CV: rrr.   PULM: ctab, no inc wob EXT: no edema

## 2015-02-24 DIAGNOSIS — J0100 Acute maxillary sinusitis, unspecified: Secondary | ICD-10-CM | POA: Insufficient documentation

## 2015-02-24 NOTE — Assessment & Plan Note (Signed)
Nontoxic, rest, fluids, doxy, f/u prn.  D/w pt.  She agrees.  Okay for outpatient f/u.

## 2015-03-06 DIAGNOSIS — Z961 Presence of intraocular lens: Secondary | ICD-10-CM | POA: Diagnosis not present

## 2015-03-06 DIAGNOSIS — H524 Presbyopia: Secondary | ICD-10-CM | POA: Diagnosis not present

## 2015-03-06 DIAGNOSIS — H401133 Primary open-angle glaucoma, bilateral, severe stage: Secondary | ICD-10-CM | POA: Diagnosis not present

## 2015-03-06 DIAGNOSIS — H43393 Other vitreous opacities, bilateral: Secondary | ICD-10-CM | POA: Diagnosis not present

## 2015-03-06 DIAGNOSIS — H1859 Other hereditary corneal dystrophies: Secondary | ICD-10-CM | POA: Diagnosis not present

## 2015-04-03 DIAGNOSIS — H401133 Primary open-angle glaucoma, bilateral, severe stage: Secondary | ICD-10-CM | POA: Diagnosis not present

## 2015-04-03 DIAGNOSIS — Z961 Presence of intraocular lens: Secondary | ICD-10-CM | POA: Diagnosis not present

## 2015-04-03 DIAGNOSIS — H1859 Other hereditary corneal dystrophies: Secondary | ICD-10-CM | POA: Diagnosis not present

## 2015-04-03 DIAGNOSIS — H43393 Other vitreous opacities, bilateral: Secondary | ICD-10-CM | POA: Diagnosis not present

## 2015-04-03 DIAGNOSIS — H524 Presbyopia: Secondary | ICD-10-CM | POA: Diagnosis not present

## 2015-05-06 ENCOUNTER — Inpatient Hospital Stay
Admission: EM | Admit: 2015-05-06 | Discharge: 2015-05-07 | DRG: 066 | Disposition: A | Discharge status: Home / Self Care | Payer: Commercial Managed Care - HMO | Attending: Internal Medicine | Admitting: Internal Medicine

## 2015-05-06 ENCOUNTER — Emergency Department: Payer: Commercial Managed Care - HMO

## 2015-05-06 ENCOUNTER — Telehealth: Payer: Self-pay

## 2015-05-06 DIAGNOSIS — Z882 Allergy status to sulfonamides status: Secondary | ICD-10-CM | POA: Diagnosis not present

## 2015-05-06 DIAGNOSIS — R531 Weakness: Secondary | ICD-10-CM | POA: Diagnosis not present

## 2015-05-06 DIAGNOSIS — Z886 Allergy status to analgesic agent status: Secondary | ICD-10-CM

## 2015-05-06 DIAGNOSIS — Z9049 Acquired absence of other specified parts of digestive tract: Secondary | ICD-10-CM | POA: Diagnosis not present

## 2015-05-06 DIAGNOSIS — Z888 Allergy status to other drugs, medicaments and biological substances status: Secondary | ICD-10-CM

## 2015-05-06 DIAGNOSIS — Z90710 Acquired absence of both cervix and uterus: Secondary | ICD-10-CM

## 2015-05-06 DIAGNOSIS — I63512 Cerebral infarction due to unspecified occlusion or stenosis of left middle cerebral artery: Secondary | ICD-10-CM | POA: Diagnosis not present

## 2015-05-06 DIAGNOSIS — Z8673 Personal history of transient ischemic attack (TIA), and cerebral infarction without residual deficits: Secondary | ICD-10-CM | POA: Diagnosis present

## 2015-05-06 DIAGNOSIS — Z880 Allergy status to penicillin: Secondary | ICD-10-CM | POA: Diagnosis not present

## 2015-05-06 DIAGNOSIS — I63232 Cerebral infarction due to unspecified occlusion or stenosis of left carotid arteries: Secondary | ICD-10-CM | POA: Diagnosis not present

## 2015-05-06 DIAGNOSIS — K219 Gastro-esophageal reflux disease without esophagitis: Secondary | ICD-10-CM | POA: Diagnosis not present

## 2015-05-06 DIAGNOSIS — E785 Hyperlipidemia, unspecified: Secondary | ICD-10-CM | POA: Diagnosis not present

## 2015-05-06 DIAGNOSIS — I2510 Atherosclerotic heart disease of native coronary artery without angina pectoris: Secondary | ICD-10-CM | POA: Diagnosis not present

## 2015-05-06 DIAGNOSIS — I10 Essential (primary) hypertension: Secondary | ICD-10-CM | POA: Diagnosis not present

## 2015-05-06 DIAGNOSIS — Z811 Family history of alcohol abuse and dependence: Secondary | ICD-10-CM | POA: Diagnosis not present

## 2015-05-06 DIAGNOSIS — Z7982 Long term (current) use of aspirin: Secondary | ICD-10-CM

## 2015-05-06 DIAGNOSIS — Z8249 Family history of ischemic heart disease and other diseases of the circulatory system: Secondary | ICD-10-CM

## 2015-05-06 DIAGNOSIS — I6350 Cerebral infarction due to unspecified occlusion or stenosis of unspecified cerebral artery: Secondary | ICD-10-CM | POA: Diagnosis not present

## 2015-05-06 DIAGNOSIS — Z9889 Other specified postprocedural states: Secondary | ICD-10-CM

## 2015-05-06 DIAGNOSIS — Z823 Family history of stroke: Secondary | ICD-10-CM

## 2015-05-06 DIAGNOSIS — M810 Age-related osteoporosis without current pathological fracture: Secondary | ICD-10-CM | POA: Diagnosis present

## 2015-05-06 DIAGNOSIS — Z79899 Other long term (current) drug therapy: Secondary | ICD-10-CM | POA: Diagnosis not present

## 2015-05-06 DIAGNOSIS — E784 Other hyperlipidemia: Secondary | ICD-10-CM | POA: Diagnosis not present

## 2015-05-06 DIAGNOSIS — Z809 Family history of malignant neoplasm, unspecified: Secondary | ICD-10-CM

## 2015-05-06 DIAGNOSIS — R7303 Prediabetes: Secondary | ICD-10-CM | POA: Diagnosis present

## 2015-05-06 DIAGNOSIS — Z818 Family history of other mental and behavioral disorders: Secondary | ICD-10-CM

## 2015-05-06 DIAGNOSIS — I639 Cerebral infarction, unspecified: Principal | ICD-10-CM | POA: Diagnosis present

## 2015-05-06 LAB — APTT: aPTT: 24 seconds (ref 24–36)

## 2015-05-06 LAB — COMPREHENSIVE METABOLIC PANEL
ALT: 24 U/L (ref 14–54)
AST: 23 U/L (ref 15–41)
Albumin: 4.1 g/dL (ref 3.5–5.0)
Alkaline Phosphatase: 72 U/L (ref 38–126)
Anion gap: 5 (ref 5–15)
BUN: 17 mg/dL (ref 6–20)
CO2: 28 mmol/L (ref 22–32)
Calcium: 9.6 mg/dL (ref 8.9–10.3)
Chloride: 106 mmol/L (ref 101–111)
Creatinine, Ser: 0.83 mg/dL (ref 0.44–1.00)
GFR calc Af Amer: >60 mL/min (ref >60)
GFR calc non Af Amer: >60 mL/min (ref >60)
Glucose, Bld: 96 mg/dL (ref 65–99)
Potassium: 3.8 mmol/L (ref 3.5–5.1)
Sodium: 139 mmol/L (ref 135–145)
Total Bilirubin: 0.6 mg/dL (ref 0.3–1.2)
Total Protein: 7.1 g/dL (ref 6.5–8.1)

## 2015-05-06 LAB — CBC
HCT: 39 % (ref 35.0–47.0)
Hemoglobin: 13.2 g/dL (ref 12.0–16.0)
MCH: 29.6 pg (ref 26.0–34.0)
MCHC: 33.8 g/dL (ref 32.0–36.0)
MCV: 87.8 fL (ref 80.0–100.0)
Platelets: 259 10*3/uL (ref 150–440)
RBC: 4.44 MIL/uL (ref 3.80–5.20)
RDW: 14.4 % (ref 11.5–14.5)
WBC: 6.9 10*3/uL (ref 3.6–11.0)

## 2015-05-06 LAB — DIFFERENTIAL
Basophils Absolute: 0.1 10*3/uL (ref 0–0.1)
Basophils Relative: 1 %
Eosinophils Absolute: 0.1 10*3/uL (ref 0–0.7)
Eosinophils Relative: 2 %
Lymphocytes Relative: 26 %
Lymphs Abs: 1.8 10*3/uL (ref 1.0–3.6)
Monocytes Absolute: 0.6 10*3/uL (ref 0.2–0.9)
Monocytes Relative: 9 %
Neutro Abs: 4.3 10*3/uL (ref 1.4–6.5)
Neutrophils Relative %: 62 %

## 2015-05-06 LAB — PROTIME-INR
INR: 0.96
Prothrombin Time: 13 seconds (ref 11.4–15.0)

## 2015-05-06 MED ORDER — ASPIRIN 325 MG PO TABS
325.0000 mg | ORAL_TABLET | Freq: Once | ORAL | Status: AC
  Administered 2015-05-06: 325 mg via ORAL
  Filled 2015-05-06: qty 1

## 2015-05-06 MED ORDER — HYDRALAZINE HCL 20 MG/ML IJ SOLN: 10.0000 mg | INTRAMUSCULAR | Status: DC | PRN

## 2015-05-06 NOTE — ED Notes (Signed)
Urine collected and sent. Pt ambulated back to bed from toilet without nurse assist.

## 2015-05-06 NOTE — H&P (Signed)
[facility] [place] NAME: [name]    MR#:  [id number]  DATE OF BIRTH:  22-Dec-1940  DATE OF ADMISSION:  05/06/2015  PRIMARY CARE PHYSICIAN: Loura Pardon, MD   REQUESTING/REFERRING PHYSICIAN: Reita Cliche, MD  CHIEF COMPLAINT:   Chief Complaint  Patient presents with  . Extremity Weakness    HISTORY OF PRESENT ILLNESS:  [name]  is a 75 y.o. female who presents with onset of right upper and lower extremity weakness in the last 24 hours. Patient states that yesterday she had a stressful day, with a lot of transit treatment from a local courthouse for personal matters. When she got home the evening she states that she is not feeling very well and went to lay down in her chair in her living room. When she got up to go to her bedroom she felt that she was unsteady, and felt that she was "dragging her right foot was " and having a hard time using her right upper and lower extremities. She also had some facial numbness. She came to the ED for evaluation today, and got an MRI which shows left frontal lobe infarct. Hospitalists were called for admission  PAST MEDICAL HISTORY:   Past Medical History  Diagnosis Date  . Hyperlipidemia   . Osteoporosis   . IC (interstitial cystitis)   . Heel spur   . IBS (irritable bowel syndrome)   . GERD (gastroesophageal reflux disease)   . Hyperglycemia   . Vitamin D deficiency   . Insomnia disorder related to known organic factor   . Coronary artery disease, non-occlusive   . HSV (herpes simplex virus) infection     PAST SURGICAL HISTORY:   Past Surgical History  Procedure Laterality Date  . Rotator cuff repair      2 TIMES  . Abdominal hysterectomy    . Cholecystectomy    . Cardiac catheterization  7/12    non obst dz    SOCIAL HISTORY:   Social History  Substance Use Topics  . Smoking status: Never Smoker   . Smokeless tobacco: Not on file  . Alcohol Use: 0.0 oz/week    0  Standard drinks or equivalent per week     Comment: rare    FAMILY HISTORY:   Family History  Problem Relation Age of Onset  . Alcohol abuse Mother   . Heart attack Mother   . GER disease Mother   . GER disease Father   . Alcohol abuse Father   . Stroke Father   . Tuberculosis Father   . Heart attack Father   . Aneurysm Father   . Cancer Sister     FEMALE  . Mental illness Sister   . Depression Sister     DRUG ALLERGIES:   Allergies  Allergen Reactions  . Ampicillin Other (See Comments)    Reaction:  Unknown   . Codeine Nausea And Vomiting  . Meperidine Hcl Nausea And Vomiting  . Penicillins Other (See Comments)    Reaction:  Unknown   . Propoxyphene Hcl Nausea And Vomiting  . Sulfa Antibiotics Hives  . Tramadol Hcl Nausea And Vomiting    MEDICATIONS AT HOME:   Prior to Admission medications   Medication Sig Start Date End Date Taking? Authorizing Provider  aspirin EC 325 MG tablet Take 325 mg by mouth at bedtime.    Yes Historical Provider, MD  Biotin 5000 MCG CAPS Take 5,000 mcg by mouth daily.  Yes Historical Provider, MD  cholecalciferol (VITAMIN D) 1000 units tablet Take 1,000 Units by mouth daily.   Yes Historical Provider, MD  COMBIGAN 0.2-0.5 % ophthalmic solution Place 1 drop into both eyes 2 (two) times daily.   Yes Historical Provider, MD  LUMIGAN 0.01 % SOLN Place 1 drop into both eyes at bedtime.   Yes Historical Provider, MD  Multiple Vitamin (MULTIVITAMIN WITH MINERALS) TABS tablet Take 1 tablet by mouth daily.   Yes Historical Provider, MD  doxycycline (VIBRA-TABS) 100 MG tablet Take 1 tablet (100 mg total) by mouth 2 (two) times daily. Patient not taking: Reported on 05/06/2015 02/21/15   Tonia Ghent, MD    REVIEW OF SYSTEMS:  Review of Systems  Constitutional: Negative for fever, chills, weight loss and malaise/fatigue.  HENT: Negative for ear pain, hearing loss and tinnitus.   Eyes: Negative for blurred vision, double vision, pain and  redness.  Respiratory: Negative for cough, hemoptysis and shortness of breath.   Cardiovascular: Negative for chest pain, palpitations, orthopnea and leg swelling.  Gastrointestinal: Negative for nausea, vomiting, abdominal pain, diarrhea and constipation.  Genitourinary: Negative for dysuria, frequency and hematuria.  Musculoskeletal: Negative for back pain, joint pain and neck pain.  Skin:       No acne, rash, or lesions  Neurological: Positive for sensory change and focal weakness. Negative for dizziness, tremors, speech change and weakness.  Endo/Heme/Allergies: Negative for polydipsia. Does not bruise/bleed easily.  Psychiatric/Behavioral: Negative for depression. The patient is not nervous/anxious and does not have insomnia.      VITAL SIGNS:   Filed Vitals:   05/06/15 2015 05/06/15 2030 05/06/15 2100 05/06/15 2130  BP:  179/97 177/64 187/78  Pulse: 62 66 52 65  Temp:      TempSrc:      Resp: 16 20 11 21   Height:      Weight:      SpO2: 97% 97% 98% 96%   Wt Readings from Last 3 Encounters:  05/06/15 77.111 kg (170 lb)  02/21/15 75.184 kg (165 lb 12 oz)  02/14/15 76.204 kg (168 lb)    PHYSICAL EXAMINATION:  Physical Exam  Vitals reviewed. Constitutional: She is oriented to person, place, and time. She appears well-developed and well-nourished. No distress.  HENT:  Head: Normocephalic and atraumatic.  Mouth/Throat: Oropharynx is clear and moist.  Eyes: Conjunctivae and EOM are normal. Pupils are equal, round, and reactive to light. No scleral icterus.  Neck: Normal range of motion. Neck supple. No JVD present. No thyromegaly present.  Cardiovascular: Normal rate, regular rhythm and intact distal pulses.  Exam reveals no gallop and no friction rub.   No murmur heard. Respiratory: Effort normal and breath sounds normal. No respiratory distress. She has no wheezes. She has no rales.  GI: Soft. Bowel sounds are normal. She exhibits no distension. There is no tenderness.   Musculoskeletal: Normal range of motion. She exhibits no edema.  No arthritis, no gout  Lymphadenopathy:    She has no cervical adenopathy.  Neurological: She is alert and oriented to person, place, and time. No cranial nerve deficit.  Neurologic: Cranial nerves II-XII intact, Sensation intact to light touch/pinprick, 5/5 strength in all extremities, no dysarthria, no aphasia, no dysphagia, memory intact, mild right pronator drift, Babinski sign not present.   Skin: Skin is warm and dry. No rash noted. No erythema.  Psychiatric: She has a normal mood and affect. Her behavior is normal. Judgment and thought content normal.    LABORATORY  PANEL:   CBC  Recent Labs Lab 05/06/15 1536  WBC 6.9  HGB 13.2  HCT 39.0  PLT 259   ------------------------------------------------------------------------------------------------------------------  Chemistries   Recent Labs Lab 05/06/15 1536  NA 139  K 3.8  CL 106  CO2 28  GLUCOSE 96  BUN 17  CREATININE 0.83  CALCIUM 9.6  AST 23  ALT 24  ALKPHOS 72  BILITOT 0.6   ------------------------------------------------------------------------------------------------------------------  Cardiac Enzymes No results for input(s): TROPONINI in the last 168 hours. ------------------------------------------------------------------------------------------------------------------  RADIOLOGY:  Ct Head Wo Contrast  05/06/2015  CLINICAL DATA:  Right-sided weakness and weakness since yesterday. EXAM: CT HEAD WITHOUT CONTRAST TECHNIQUE: Contiguous axial images were obtained from the base of the skull through the vertex without intravenous contrast. COMPARISON:  None. FINDINGS: There is no evidence of mass effect, midline shift, or extra-axial fluid collections. There is no evidence of a space-occupying lesion or intracranial hemorrhage. There is no evidence of a cortical-based area of acute infarction. There is generalized cerebral atrophy. There is  periventricular white matter low attenuation likely secondary to microangiopathy. The ventricles and sulci are appropriate for the patient's age. The basal cisterns are patent. Visualized portions of the orbits are unremarkable. The visualized portions of the paranasal sinuses and mastoid air cells are unremarkable. The osseous structures are unremarkable. IMPRESSION: No acute intracranial pathology. Electronically Signed   By: Kathreen Devoid   On: 05/06/2015 15:49   Mr Brain Wo Contrast  05/06/2015  CLINICAL DATA:  75 year old female with right-sided weakness since yesterday. Subsequent encounter. EXAM: MRI HEAD WITHOUT CONTRAST TECHNIQUE: Multiplanar, multiecho pulse sequences of the brain and surrounding structures were obtained without intravenous contrast. COMPARISON:  05/06/2015 head CT.  No comparison brain MR. 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 Moderate small vessel disease changes most notable right frontal lobe. No intracranial hemorrhage. Global mild atrophy without hydrocephalus. Major intracranial vascular structures are patent. No intracranial mass lesion noted on this unenhanced exam. Post lens replacement otherwise orbital structures unremarkable. Small pituitary gland.  Cervical medullary junction unremarkable. Minimal ethmoid sinus air cell mucosal thickening. 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 Moderate small vessel disease changes most notable right frontal lobe. Global mild atrophy. Electronically Signed   By: Genia Del M.D.   On: 05/06/2015 20:26    EKG:   Orders placed or performed during the hospital encounter of 05/06/15  . ED EKG  . 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 On exam her deficits seem to have improved.  admit with stroke workup per stroke order set and neurology  consult. Active Problems:   HTN (hypertension) - patient is more than 24 hours out from onset of her symptoms, so we will control her blood pressure to goal less than 160/100 with when necessary antihypertensives   CAD (coronary artery disease) - documented nonobstructive, patient is not on any medications for this. She will get her lipids and A1c checked his prior stroke workup, and medication initiation of treatment for these things will likely help her with her CAD as well. We'll monitor her on telemetry while here.   HLD (hyperlipidemia) - check lipids as part of stroke workup above   GERD (gastroesophageal reflux disease) - treat when necessary, not on home meds for this  All the records are reviewed and case discussed with ED provider. Management plans discussed with  the patient and/or family.  DVT PROPHYLAXIS: SubQ lovenox  GI PROPHYLAXIS: None  ADMISSION STATUS: Inpatient  CODE STATUS: Full Code Status History    This patient does not have a recorded code status. Please follow your organizational policy for patients in this situation.      TOTAL TIME TAKING CARE OF THIS PATIENT: 45 minutes.    Oluwaferanmi Wain Nisland 05/06/2015, 10:25 PM  [facility]  Office  [phone]  CC: Primary care physician; Loura Pardon, MD

## 2015-05-06 NOTE — ED Notes (Signed)
Patient returned from MRI.  Monitor reapplied. No acute distress noted.  Hypertensive but otherwise VSS.

## 2015-05-06 NOTE — ED Notes (Addendum)
States she noticed some right sided weakness since yesterday and when she woke up this am she still felt the same ..states she has been under a lot of stress     Steady gait and speech is clear

## 2015-05-06 NOTE — ED Notes (Signed)
Patient transported to MRI 

## 2015-05-06 NOTE — Telephone Encounter (Signed)
On 05/05/15 pt felt dizzy,rt arm and leg felt heavy;pt was stressed. Today still feels dizzy, rt arm and leg are heavy feeling; pt can walk but is walking with cane for assistance. Face feels numb and pt feels disorientated today. Pt wants to be seen [facility]; Dr Glori Bickers advised pt needs to go to ED for eval and for possible CT scan. Pt voiced understanding and will go to [facility] ED now.

## 2015-05-06 NOTE — ED Notes (Signed)
States that leg and arm were weak yesterday and she had to hold onto things, this is unusual for her.  States it started at 1600 in the afternoon.  States she had to take cane out to do her normal work this morning.

## 2015-05-06 NOTE — ED Provider Notes (Signed)
[facility] Emergency Department Provider Note   ____________________________________________  Time seen: Approximately 5:30pm I have reviewed the triage vital signs and the triage nursing note.  HISTORY  Chief Complaint Extremity Weakness   Historian Patient  HPI [name] is a 75 y.o. female who reports borderline high blood pressure, borderline high cholesterol for which she does not take medication for, and a family history of mom with strokes, who experience right arm and left right leg weakness yesterday. Onset was sometime late in the afternoon yesterday. She states she had an extremely stressful day going to court and having problems with her lawyer, when she got home she had a headache and she knows that her right arm and right leg felt weak. She had a little bit of trouble walking. She felt like both sides of her face felt numb. Symptoms persisted overnight. Her face feels better. There is no troubles with speech. No facial droop. She still has trouble walking due to right leg weakness. She still feels like her right arm is weak as well. Symptoms are moderate. She took 650 of aspirin yesterday. She tries to take a baby aspirin daily, but doesn't necessarily always take it. No prior history of stroke or TIA.      Past Medical History  Diagnosis Date  . Hyperlipidemia   . Osteoporosis   . IC (interstitial cystitis)   . Heel spur   . IBS (irritable bowel syndrome)   . GERD (gastroesophageal reflux disease)   . Hyperglycemia   . Vitamin D deficiency   . Insomnia disorder related to known organic factor   . Coronary artery disease, non-occlusive   . HSV (herpes simplex virus) infection     Patient Active Problem List   Diagnosis Date Noted  . Maxillary sinusitis, acute 02/24/2015  . Elevated blood pressure 02/14/2015  . Insect bites 09/17/2014  . Laceration of foot, left 04/17/2014  . Urine frequency 12/28/2013  . Rectal bleeding  09/25/2013  . Joint pain 05/01/2013  . Tick bite of multiple sites 05/01/2013  . Pain in joint, ankle and foot 03/02/2013  . Seborrheic keratosis 07/05/2012  . Ankle joint pain 07/16/2011  . 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 Callus of foot 10/16/2010  . Coronary artery disease, non-occlusive 10/16/2010  . INSOMNIA 02/02/2010  . HAND PAIN, BILATERAL 01/09/2010  . CRAMP OF LIMB 01/09/2010  . HYPERGLYCEMIA 07/29/2009  . ALLERGIC RHINITIS 12/04/2008  . UNSPECIFIED VITAMIN D DEFICIENCY 04/30/2008  . COLONIC POLYPS 06/28/2007  . GERD 06/28/2007  . Hyperlipidemia 03/17/2007  . IBS 03/17/2007  . INTERSTITIAL CYSTITIS 03/17/2007  . OSTEOPOROSIS 03/17/2007    Past Surgical History  Procedure Laterality Date  . Rotator cuff repair      2 TIMES  . Abdominal hysterectomy    . Cholecystectomy    . Cardiac catheterization  7/12    non obst dz    Current Outpatient Rx  Name  Route  Sig  Dispense  Refill  . aspirin 81 MG tablet   Oral   Take 81 mg by mouth daily.           . COMBIGAN 0.2-0.5 % ophthalmic solution   Both Eyes   Place 1 drop into both eyes 2 (two) times daily.      2     Dispense as written.   Marland Kitchen doxycycline (VIBRA-TABS) 100 MG tablet   Oral   Take 1 tablet (100 mg total) by mouth 2 (two) times daily. Patient not taking:  Reported on 05/06/2015   20 tablet   0   . LUMIGAN 0.01 % SOLN   Both Eyes   Place 1 drop into both eyes at bedtime.         . Multiple Vitamins-Minerals (HAIR/SKIN/NAILS PO)   Oral   Take 1 tablet by mouth.         Marland Kitchen VITAMIN A PO   Oral   Take 1 capsule by mouth daily. Reported on 02/21/2015           Allergies Codeine; Meperidine hcl; Penicillins; Propoxyphene hcl; Sulfa antibiotics; and Tramadol hcl  Family History  Problem Relation Age of Onset  . Alcohol abuse Mother   . Heart attack Mother   . GER disease Mother   . GER disease Father   . Alcohol abuse Father   . Stroke Father   . Tuberculosis Father   . Heart  attack Father   . Aneurysm Father   . Cancer Sister     FEMALE  . Mental illness Sister   . Depression Sister     Social History Social History  Substance Use Topics  . Smoking status: Never Smoker   . Smokeless tobacco: Not on file  . Alcohol Use: 0.0 oz/week    0 Standard drinks or equivalent per week     Comment: rare    Review of Systems  Constitutional: Negative for fever. Eyes: Negative for visual changes. ENT: Negative for sore throat. Cardiovascular: Negative for chest pain. Respiratory: Negative for shortness of breath. Gastrointestinal: Negative for abdominal pain, vomiting and diarrhea. Genitourinary: Negative for dysuria. Musculoskeletal: Negative for back pain. Skin: Negative for rash. Neurological: Negative for headache. 10 point Review of Systems otherwise negative ____________________________________________   PHYSICAL EXAM:  VITAL SIGNS: ED Triage Vitals  Enc Vitals Group     BP 05/06/15 1525 204/71 mmHg     Pulse Rate 05/06/15 1525 75     Resp 05/06/15 1525 20     Temp 05/06/15 1525 98 F (36.7 C)     Temp Source 05/06/15 1525 Oral     SpO2 05/06/15 1525 95 %     Weight 05/06/15 1525 170 lb (77.111 kg)     Height 05/06/15 1525 5\' 6"  (1.676 m)     Head Cir --      Peak Flow --      Pain Score --      Pain Loc --      Pain Edu? --      Excl. in [place]? --      Constitutional: Alert and oriented. Well appearing and in no distress. HEENT   Head: Normocephalic and atraumatic.      Eyes: Conjunctivae are normal. PERRL. Normal extraocular movements.      Ears:         Nose: No congestion/rhinnorhea.   Mouth/Throat: Mucous membranes are moist.   Neck: No stridor. Cardiovascular/Chest: Normal rate, regular rhythm.  No murmurs, rubs, or gallops. Respiratory: Normal respiratory effort without tachypnea nor retractions. Breath sounds are clear and equal bilaterally. No wheezes/rales/rhonchi. Gastrointestinal: Soft. No distention, no  guarding, no rebound. Nontender.    Genitourinary/rectal:Deferred Musculoskeletal: Nontender with normal range of motion in all extremities. No joint effusions.  No lower extremity tenderness.  No edema. Neurologic:  Normal speech and language. No facial droop. Sensation of the face intact. No aphasia. I was unable to overcome her appreciate weakness in the right lower extremity, but when the patient walks it does appear that she  has some weakness in the right leg. 4-5 strength in the right biceps and triceps. Grip strength equal bilaterally. Skin:  Skin is warm, dry and intact. No rash noted. Psychiatric: Mood and affect are normal. Speech and behavior are normal. Patient exhibits appropriate insight and judgment.  ____________________________________________   EKG I, Lisa Roca, MD, the attending physician have personally viewed and interpreted all ECGs.  69 bpm. No sinus rhythm with sinus arrhythmia. Narrow QRS. Normal axis. Normal ST and T-wave ____________________________________________  LABS (pertinent positives/negatives)  INR 0.96 CBC within normal limits Comprehensive metabolic panel without significant abnormality  ____________________________________________  RADIOLOGY All Xrays were viewed by me. Imaging interpreted by Radiologist.  CT head noncontrast: No acute intracranial pathology  MRI brain:  IMPRESSION: Acute small nonhemorrhagic infarct medial posterior left frontal lobe.  Moderate small vessel disease changes most notable right frontal lobe.  Global mild atrophy. __________________________________________  PROCEDURES  Procedure(s) performed: None  Critical Care performed: None  ____________________________________________   ED COURSE / ASSESSMENT AND PLAN  Pertinent labs & imaging results that were available during my care of the patient were reviewed by me and considered in my medical decision making (see chart for details).   Patient  presenting with symptoms concerning for possible stroke, with right arm and leg weakness since yesterday late afternoon. Patient is outside any TPA time window.  Her head CT is negative. I will send her for MRI.  ----------------------------------------- 9:18 PM on 05/06/2015 -----------------------------------------  MRI confirms acute infarct, patient was given 325 mg aspirin and I discussed with Dr. Jannifer Franklin, hospitalist for admission.    CONSULTATIONS:   Hospitalist for admission   Patient / Family / Caregiver informed of clinical course, medical decision-making process, and agree with plan.    ___________________________________________   FINAL CLINICAL IMPRESSION(S) / ED DIAGNOSES   Final diagnoses:  Acute ischemic stroke [facility])              Note: This dictation was prepared with Dragon dictation. Any transcriptional errors that result from this process are unintentional   Lisa Roca, MD 05/06/15 2118

## 2015-05-07 ENCOUNTER — Inpatient Hospital Stay: Payer: Commercial Managed Care - HMO

## 2015-05-07 ENCOUNTER — Inpatient Hospital Stay (HOSPITAL_COMMUNITY)
Admit: 2015-05-07 | Discharge: 2015-05-07 | Disposition: A | Discharge status: Home / Self Care | Payer: Commercial Managed Care - HMO | Attending: Internal Medicine | Admitting: Internal Medicine

## 2015-05-07 DIAGNOSIS — I6350 Cerebral infarction due to unspecified occlusion or stenosis of unspecified cerebral artery: Secondary | ICD-10-CM

## 2015-05-07 DIAGNOSIS — I639 Cerebral infarction, unspecified: Principal | ICD-10-CM

## 2015-05-07 LAB — LIPID PANEL
Cholesterol: 202 mg/dL — ABNORMAL HIGH (ref 0–200)
HDL: 58 mg/dL (ref >40)
LDL Cholesterol: 129 mg/dL — ABNORMAL HIGH (ref 0–99)
Total CHOL/HDL Ratio: 3.5 RATIO
Triglycerides: 74 mg/dL (ref <150)
VLDL: 15 mg/dL (ref 0–40)

## 2015-05-07 LAB — TROPONIN I: Troponin I: <0.03 ng/mL (ref <0.031)

## 2015-05-07 LAB — CBC
HCT: 37.9 % (ref 35.0–47.0)
Hemoglobin: 12.8 g/dL (ref 12.0–16.0)
MCH: 29.8 pg (ref 26.0–34.0)
MCHC: 33.8 g/dL (ref 32.0–36.0)
MCV: 88.1 fL (ref 80.0–100.0)
Platelets: 235 10*3/uL (ref 150–440)
RBC: 4.3 MIL/uL (ref 3.80–5.20)
RDW: 13.9 % (ref 11.5–14.5)
WBC: 7.3 10*3/uL (ref 3.6–11.0)

## 2015-05-07 LAB — ECHOCARDIOGRAM COMPLETE
Height: 66.5 in
Weight: 2598.4 oz

## 2015-05-07 LAB — CREATININE, SERUM
Creatinine, Ser: 0.82 mg/dL (ref 0.44–1.00)
GFR calc Af Amer: >60 mL/min (ref >60)
GFR calc non Af Amer: >60 mL/min (ref >60)

## 2015-05-07 LAB — HEMOGLOBIN A1C: Hgb A1c MFr Bld: 5.5 % (ref 4.0–6.0)

## 2015-05-07 MED ORDER — ASPIRIN EC 325 MG PO TBEC: 325.0000 mg | DELAYED_RELEASE_TABLET | Freq: Every day | ORAL | Status: DC

## 2015-05-07 MED ORDER — STROKE: EARLY STAGES OF RECOVERY BOOK
Freq: Once | Status: AC
  Administered 2015-05-07: 01:00:00

## 2015-05-07 MED ORDER — ATORVASTATIN CALCIUM 80 MG PO TABS: 80.0000 mg | ORAL_TABLET | Freq: Every day | ORAL | Status: DC

## 2015-05-07 MED ORDER — TRAZODONE HCL 50 MG PO TABS
50.0000 mg | ORAL_TABLET | Freq: Every day | ORAL | Status: DC
  Administered 2015-05-07: 50 mg via ORAL
  Filled 2015-05-07: qty 1

## 2015-05-07 MED ORDER — BRIMONIDINE TARTRATE-TIMOLOL 0.2-0.5 % OP SOLN: 1.0000 [drp] | Freq: Two times a day (BID) | OPHTHALMIC | Status: DC

## 2015-05-07 MED ORDER — ATORVASTATIN CALCIUM 20 MG PO TABS
80.0000 mg | ORAL_TABLET | Freq: Every day | ORAL | Status: DC
  Administered 2015-05-07: 80 mg via ORAL
  Filled 2015-05-07: qty 4

## 2015-05-07 MED ORDER — BRIMONIDINE TARTRATE 0.2 % OP SOLN
1.0000 [drp] | Freq: Two times a day (BID) | OPHTHALMIC | Status: DC
  Administered 2015-05-07: 08:00:00 1 [drp] via OPHTHALMIC
  Filled 2015-05-07: qty 5

## 2015-05-07 MED ORDER — ENOXAPARIN SODIUM 40 MG/0.4ML ~~LOC~~ SOLN
40.0000 mg | SUBCUTANEOUS | Status: DC
  Administered 2015-05-07: 40 mg via SUBCUTANEOUS
  Filled 2015-05-07: qty 0.4

## 2015-05-07 MED ORDER — CLOPIDOGREL BISULFATE 75 MG PO TABS
75.0000 mg | ORAL_TABLET | Freq: Every day | ORAL | Status: DC
  Administered 2015-05-07: 12:00:00 75 mg via ORAL
  Filled 2015-05-07: qty 1

## 2015-05-07 MED ORDER — TIMOLOL MALEATE 0.5 % OP SOLN
1.0000 [drp] | Freq: Two times a day (BID) | OPHTHALMIC | Status: DC
Start: 2015-05-07 — End: 2015-05-07
  Administered 2015-05-07: 1 [drp] via OPHTHALMIC
  Filled 2015-05-07: qty 5

## 2015-05-07 MED ORDER — LATANOPROST 0.005 % OP SOLN
1.0000 [drp] | Freq: Every day | OPHTHALMIC | Status: DC
  Filled 2015-05-07: qty 2.5

## 2015-05-07 MED ORDER — CLOPIDOGREL BISULFATE 75 MG PO TABS: 75.0000 mg | ORAL_TABLET | Freq: Every day | ORAL | Status: DC

## 2015-05-07 NOTE — Evaluation (Signed)
Clinical/Bedside Swallow Evaluation Patient Details  Name: [name] MRN: [id number] Date of Birth: 01/17/1941  Today's Date: 05/07/2015 Time: SLP Start Time (ACUTE ONLY): 0940 SLP Stop Time (ACUTE ONLY): 1040 SLP Time Calculation (min) (ACUTE ONLY): 60 min  Past Medical History:  Past Medical History  Diagnosis Date  . Hyperlipidemia   . Osteoporosis   . IC (interstitial cystitis)   . Heel spur   . IBS (irritable bowel syndrome)   . GERD (gastroesophageal reflux disease)   . Hyperglycemia   . Vitamin D deficiency   . Insomnia disorder related to known organic factor   . Coronary artery disease, non-occlusive   . HSV (herpes simplex virus) infection    Past Surgical History:  Past Surgical History  Procedure Laterality Date  . Rotator cuff repair      2 TIMES  . Abdominal hysterectomy    . Cholecystectomy    . Cardiac catheterization  7/12    non obst dz   HPI:  Pt is a 75 y.o. female w/ h/o GERD, IBS, CAD, and other medical issues who presents with onset of right upper and lower extremity weakness in the last 24 hours. Patient states that yesterday she had a stressful day, with a lot of transit treatment from a local courthouse for personal matters. When she got home the evening she states that she is not feeling very well and went to lay down in her chair in her living room. When she got up to go to her bedroom she felt that she was unsteady, and felt that she was "dragging her right foot was " and having a hard time using her right upper and lower extremities. She also had some facial numbness. She came to the ED for evaluation today, and got an MRI which shows left frontal lobe infarct. Pt is verbally conversive at conversational level; appropriate speech. Pt stated she has had (baseline) "forgetfullness of people's names and faces" as well as numbers - this is not new per pt. NSG has noted appropriate speech and swallowing since admission; pt agrees.    Assessment  / Plan / Recommendation Clinical Impression  Pt appeared to adequately tolerate trials of thin liquids and solids/purees w/ no overt s/s of aspiration noted; no oral phase deficits noted. Pt fed self appropriately and followed general aspiration precautions of eating slowly and sitting upright to eat/drink. Pt appears at reduced risk for aspiration w/ regular diet and thin liquids; general aspiration precautions. MD agreed w/ diet upgrade. No further skilled ST services indicated at this time; NSG to reconsult if any change in status while admitted.     Aspiration Risk   (reduced)    Diet Recommendation  regular; thin liquids; general aspiration precautions  Medication Administration: Whole meds with liquid    Other  Recommendations Oral Care Recommendations: Oral care BID;Patient independent with oral care   Follow up Recommendations  None    Frequency and Duration            Prognosis Prognosis for Safe Diet Advancement: Good Barriers to Reach Goals:  (none)      Swallow Study   General Date of Onset: 05/06/15 HPI: Pt is a 75 y.o. female w/ h/o GERD, IBS, CAD, and other medical issues who presents with onset of right upper and lower extremity weakness in the last 24 hours. Patient states that yesterday she had a stressful day, with a lot of transit treatment from a local courthouse for personal matters. When she  got home the evening she states that she is not feeling very well and went to lay down in her chair in her living room. When she got up to go to her bedroom she felt that she was unsteady, and felt that she was "dragging her right foot was " and having a hard time using her right upper and lower extremities. She also had some facial numbness. She came to the ED for evaluation today, and got an MRI which shows left frontal lobe infarct. Pt is verbally conversive at conversational level; appropriate speech. Pt stated she has had (baseline) "forgetfullness of people's names and  faces" as well as numbers - this is not new per pt. NSG has noted appropriate speech and swallowing since admission; pt agrees.  Type of Study: Bedside Swallow Evaluation Previous Swallow Assessment: none Diet Prior to this Study: Regular;Thin liquids Temperature Spikes Noted: No (wbc not elevated) Respiratory Status: Room air History of Recent Intubation: No Behavior/Cognition: Alert;Cooperative;Pleasant mood Oral Cavity Assessment: Within Functional Limits Oral Care Completed by SLP: Recent completion by staff Oral Cavity - Dentition: Adequate natural dentition Vision: Functional for self-feeding Self-Feeding Abilities: Able to feed self Patient Positioning: Upright in bed Baseline Vocal Quality: Normal Volitional Cough: Strong Volitional Swallow: Able to elicit    Oral/Motor/Sensory Function Overall Oral Motor/Sensory Function: Within functional limits   Ice Chips Ice chips: Not tested   Thin Liquid Thin Liquid: Within functional limits Presentation: Cup;Self Fed;Straw Other Comments: ~4 ozs    Nectar Thick Nectar Thick Liquid: Not tested   Honey Thick Honey Thick Liquid: Not tested   Puree Puree: Within functional limits Presentation: Self Fed;Spoon (3-4 ozs)   Solid   GO   Solid: Within functional limits Presentation: Self Fed Other Comments: 5 trials        Orinda Kenner, MS, CCC-SLP  [name] 05/07/2015,12:24 PM

## 2015-05-07 NOTE — Progress Notes (Signed)
PT Cancellation Note  Patient Details Name: Sammie Ayad MRN: [id number] DOB: 06/22/1940   Cancelled Treatment:    Reason Eval/Treat Not Completed: Other (comment). Pt currently out of room for imaging. Will re-attempt as time permits.   Yaret Hush 05/07/2015, 9:46 AM  Greggory Stallion, PT, DPT [phone]

## 2015-05-07 NOTE — Evaluation (Signed)
Physical Therapy Evaluation Patient Details Name: [name] MRN: [id number] DOB: Jun 04, 1940 Today's Date: 05/07/2015   History of Present Illness  Pt admitted for positive CVA with complaints of R UE/LE weakness. Pt with history of IBS, GERD, and hyperlipidemia.  Clinical Impression  Pt is a pleasant 75 year old female who was admitted for CVA.  Pt demonstrates all bed mobility/transfers/ambulation at baseline level. No coordination/strength deficits at this time. Pt does not require any further PT needs at this time. Pt will be dc in house and does not require follow up. RN aware. Will dc current orders.      Follow Up Recommendations No PT follow up    Equipment Recommendations       Recommendations for Other Services       Precautions / Restrictions Precautions Precautions: Fall Restrictions Weight Bearing Restrictions: No      Mobility  Bed Mobility Overal bed mobility: Independent             General bed mobility comments: safe technique performed  Transfers Overall transfer level: Independent               General transfer comment: safe technique with transfer. No AD required  Ambulation/Gait Ambulation/Gait assistance: Supervision Ambulation Distance (Feet): 120 Feet Assistive device: None Gait Pattern/deviations: Step-through pattern     General Gait Details: ambulated using no AD demonstrating reciprocal gait pattern. No LOB or dizziness noted. Slight complaints of dizziness upon returning to sit in chair.  Stairs            Wheelchair Mobility    Modified Rankin (Stroke Patients Only)       Balance Overall balance assessment: No apparent balance deficits (not formally assessed)                                           Pertinent Vitals/Pain Pain Assessment: No/denies pain    Home Living Family/patient expects to be discharged to:: Private residence Living Arrangements: Spouse/significant  other Available Help at Discharge: Family Type of Home: House Home Access: Stairs to enter Entrance Stairs-Rails: Can reach both Entrance Stairs-Number of Steps: 5 Home Layout: One level Home Equipment: Cane - single point      Prior Function Level of Independence: Independent         Comments: needed SPC for initial weakness     Hand Dominance        Extremity/Trunk Assessment   Upper Extremity Assessment: Overall WFL for tasks assessed           Lower Extremity Assessment: Overall WFL for tasks assessed         Communication   Communication: No difficulties  Cognition Arousal/Alertness: Awake/alert Behavior During Therapy: WFL for tasks assessed/performed Overall Cognitive Status: Within Functional Limits for tasks assessed                      General Comments      Exercises        Assessment/Plan    PT Assessment Patent does not need any further PT services  PT Diagnosis Difficulty walking   PT Problem List    PT Treatment Interventions     PT Goals (Current goals can be found in the Care Plan section) Acute Rehab PT Goals Patient Stated Goal: to get stronger PT Goal Formulation: All assessment and education complete, DC therapy  Time For Goal Achievement: 18-May-2015 Potential to Achieve Goals: Good    Frequency     Barriers to discharge        Co-evaluation               End of Session Equipment Utilized During Treatment: Gait belt Activity Tolerance: Patient tolerated treatment well Patient left: in chair Nurse Communication: Mobility status         Time: [id number] PT Time Calculation (min) (ACUTE ONLY): 26 min   Charges:   PT Evaluation $PT Eval Low Complexity: 1 Procedure     PT G Codes:        Ancil Dewan May 18, 2015, 11:13 AM  Greggory Stallion, PT, DPT [phone]

## 2015-05-07 NOTE — Discharge Summary (Signed)
[facility] NAME: [name]    MR#:  [id number]  DATE OF BIRTH:  1940-06-29  DATE OF ADMISSION:  05/06/2015 ADMITTING PHYSICIAN: Lance Coon, MD  DATE OF DISCHARGE: No discharge date for patient encounter.  PRIMARY CARE PHYSICIAN: Loura Pardon, MD    ADMISSION DIAGNOSIS:  Acute ischemic stroke ([place]) [I63.9]  DISCHARGE DIAGNOSIS:  Acute nonhemorrhagic infarct medial posterior left frontal Lobe. Hyperlipidemia unspecified Prediabetes-hemoglobin A1c 5.9   SECONDARY DIAGNOSIS:   Past Medical History  Diagnosis Date  . Hyperlipidemia   . Osteoporosis   . IC (interstitial cystitis)   . Heel spur   . IBS (irritable bowel syndrome)   . GERD (gastroesophageal reflux disease)   . Hyperglycemia   . Vitamin D deficiency   . Insomnia disorder related to known organic factor   . Coronary artery disease, non-occlusive   . HSV (herpes simplex virus) infection     HOSPITAL COURSE:  [name]  is a 75 y.o. female admitted 05/06/2015 with chief complaint Right-sided weakness. Please see H&P performed by Dr. Jannifer Franklin for further information. Patient presents to hospital with strokelike symptoms however given duration and severity TPA was not given. patient underwent MRI testing which revealed acute small nonhemorrhagic infarct in the medial posterior left frontal lobe. Remainder of stroke workup revealed hyperlipidemia otherwise within normal limits. Neurology consult who recommended risk factor modification. Patient's symptoms have completely resolved on day of discharge  DISCHARGE CONDITIONS:   Stable/improved  CONSULTS OBTAINED:  Treatment Team:  Alexis Goodell, MD  DRUG ALLERGIES:   Allergies  Allergen Reactions  . Ampicillin Other (See Comments)    Reaction:  Unknown   . Codeine Nausea And Vomiting  . Meperidine Hcl Nausea And Vomiting  . Penicillins Other (See Comments)    Reaction:  Unknown   .  Propoxyphene Hcl Nausea And Vomiting  . Sulfa Antibiotics Hives  . Tramadol Hcl Nausea And Vomiting    DISCHARGE MEDICATIONS:   Current Discharge Medication List    START taking these medications   Details  atorvastatin (LIPITOR) 80 MG tablet Take 1 tablet (80 mg total) by mouth daily at 6 PM. Qty: 30 tablet, Refills: 0    clopidogrel (PLAVIX) 75 MG tablet Take 1 tablet (75 mg total) by mouth daily. Qty: 30 tablet, Refills: 0      CONTINUE these medications which have NOT CHANGED   Details  aspirin EC 325 MG tablet Take 325 mg by mouth at bedtime.     Biotin 5000 MCG CAPS Take 5,000 mcg by mouth daily.    cholecalciferol (VITAMIN D) 1000 units tablet Take 1,000 Units by mouth daily.    COMBIGAN 0.2-0.5 % ophthalmic solution Place 1 drop into both eyes 2 (two) times daily. Refills: 2    LUMIGAN 0.01 % SOLN Place 1 drop into both eyes at bedtime.    Multiple Vitamin (MULTIVITAMIN WITH MINERALS) TABS tablet Take 1 tablet by mouth daily.      STOP taking these medications     doxycycline (VIBRA-TABS) 100 MG tablet          DISCHARGE INSTRUCTIONS:  Risk factor modification-added Plavix as well as Lipitor  DIET:  Cardiac diet  DISCHARGE CONDITION:  Good  ACTIVITY:  Activity as tolerated  OXYGEN:  Home Oxygen: No.   Oxygen Delivery: room air  DISCHARGE LOCATION:  home   If you experience worsening of your admission symptoms, develop shortness of breath, life threatening emergency, suicidal  or homicidal thoughts you must seek medical attention immediately by calling 911 or calling your MD immediately  if symptoms less severe.  You Must read complete instructions/literature along with all the possible adverse reactions/side effects for all the Medicines you take and that have been prescribed to you. Take any new Medicines after you have completely understood and accpet all the possible adverse reactions/side effects.   Please note  You were cared for by a  hospitalist during your hospital stay. If you have any questions about your discharge medications or the care you received while you were in the hospital after you are discharged, you can call the unit and asked to speak with the hospitalist on call if the hospitalist that took care of you is not available. Once you are discharged, your primary care physician will handle any further medical issues. Please note that NO REFILLS for any discharge medications will be authorized once you are discharged, as it is imperative that you return to your primary care physician (or establish a relationship with a primary care physician if you do not have one) for your aftercare needs so that they can reassess your need for medications and monitor your lab values.    On the day of Discharge:   VITAL SIGNS:  Blood pressure 95/53, pulse 61, temperature 98.8 F (37.1 C), temperature source Oral, resp. rate 20, height 5' 6.5" (1.689 m), weight 73.664 kg (162 lb 6.4 oz), SpO2 98 %.  I/O:   Intake/Output Summary (Last 24 hours) at 05/07/15 1246 Last data filed at 05/06/15 2328  Gross per 24 hour  Intake      0 ml  Output    150 ml  Net   -150 ml    PHYSICAL EXAMINATION:  GENERAL:  75 y.o.-year-old patient lying in the bed with no acute distress.  EYES: Pupils equal, round, reactive to light and accommodation. No scleral icterus. Extraocular muscles intact.  HEENT: Head atraumatic, normocephalic. Oropharynx and nasopharynx clear.  NECK:  Supple, no jugular venous distention. No thyroid enlargement, no tenderness.  LUNGS: Normal breath sounds bilaterally, no wheezing, rales,rhonchi or crepitation. No use of accessory muscles of respiration.  CARDIOVASCULAR: S1, S2 normal. No murmurs, rubs, or gallops.  ABDOMEN: Soft, non-tender, non-distended. Bowel sounds present. No organomegaly or mass.  EXTREMITIES: No pedal edema, cyanosis, or clubbing.  NEUROLOGIC: Cranial nerves II through XII are intact. Muscle  strength 5/5 in all extremities. Sensation intact. Gait not checked.  PSYCHIATRIC: The patient is alert and oriented x 3.  SKIN: No obvious rash, lesion, or ulcer.   DATA REVIEW:   CBC  Recent Labs Lab 05/07/15 0010  WBC 7.3  HGB 12.8  HCT 37.9  PLT 235    Chemistries   Recent Labs Lab 05/06/15 1536 05/07/15 0010  NA 139  --   K 3.8  --   CL 106  --   CO2 28  --   GLUCOSE 96  --   BUN 17  --   CREATININE 0.83 0.82  CALCIUM 9.6  --   AST 23  --   ALT 24  --   ALKPHOS 72  --   BILITOT 0.6  --     Cardiac Enzymes  Recent Labs Lab 05/07/15 0010  TROPONINI <0.03    Microbiology Results  No results found for this or any previous visit.  RADIOLOGY:  Dg Chest 2 View  05/07/2015  CLINICAL DATA:  Right-sided weakness yesterday, stroke. Hypertension. EXAM: CHEST  2 VIEW  COMPARISON:  None. FINDINGS: Heart and mediastinal contours are within normal limits. No focal opacities or effusions. No acute bony abnormality. IMPRESSION: No active cardiopulmonary disease. Electronically Signed   By: Rolm Baptise M.D.   On: 05/07/2015 09:08   Ct Head Wo Contrast  05/06/2015  CLINICAL DATA:  Right-sided weakness and weakness since yesterday. EXAM: CT HEAD WITHOUT CONTRAST TECHNIQUE: Contiguous axial images were obtained from the base of the skull through the vertex without intravenous contrast. COMPARISON:  None. FINDINGS: There is no evidence of mass effect, midline shift, or extra-axial fluid collections. There is no evidence of a space-occupying lesion or intracranial hemorrhage. There is no evidence of a cortical-based area of acute infarction. There is generalized cerebral atrophy. There is periventricular white matter low attenuation likely secondary to microangiopathy. The ventricles and sulci are appropriate for the patient's age. The basal cisterns are patent. Visualized portions of the orbits are unremarkable. The visualized portions of the paranasal sinuses and mastoid air cells  are unremarkable. The osseous structures are unremarkable. IMPRESSION: No acute intracranial pathology. Electronically Signed   By: Kathreen Devoid   On: 05/06/2015 15:49   Mr Brain Wo Contrast  05/06/2015  CLINICAL DATA:  75 year old female with right-sided weakness since yesterday. Subsequent encounter. EXAM: MRI HEAD WITHOUT CONTRAST TECHNIQUE: Multiplanar, multiecho pulse sequences of the brain and surrounding structures were obtained without intravenous contrast. COMPARISON:  05/06/2015 head CT.  No comparison brain MR. 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 Moderate small vessel disease changes most notable right frontal lobe. No intracranial hemorrhage. Global mild atrophy without hydrocephalus. Major intracranial vascular structures are patent. No intracranial mass lesion noted on this unenhanced exam. Post lens replacement otherwise orbital structures unremarkable. Small pituitary gland.  Cervical medullary junction unremarkable. Minimal ethmoid sinus air cell mucosal thickening. 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 Moderate small vessel disease changes most notable right frontal lobe. Global mild atrophy. Electronically Signed   By: Genia Del M.D.   On: 05/06/2015 20:26   US Carotid Bilateral  05/07/2015  CLINICAL DATA:  Stroke.  Hypertension, syncope, hyperlipidemia. EXAM: BILATERAL CAROTID DUPLEX ULTRASOUND TECHNIQUE: Pearline Cables scale imaging, color Doppler and duplex ultrasound was performed of bilateral carotid and vertebral arteries in the neck. COMPARISON:  None. REVIEW OF SYSTEMS: Quantification of carotid stenosis is based on velocity parameters that correlate the residual internal carotid diameter with NASCET-based stenosis levels, using the diameter of the distal internal carotid lumen as the denominator for stenosis measurement. The following velocity measurements were obtained: PEAK SYSTOLIC/END DIASTOLIC  RIGHT ICA:                     86/16cm/sec CCA:                     [id number] SYSTOLIC ICA/CCA RATIO:  0.8 DIASTOLIC ICA/CCA RATIO: 0.9 ECA:                     171cm/sec LEFT ICA:                     80/20cm/sec CCA:                     [id number] SYSTOLIC ICA/CCA RATIO:  0.8 DIASTOLIC ICA/CCA RATIO: 1.3 ECA:                     125cm/sec FINDINGS: RIGHT CAROTID ARTERY: Mild intimal thickening.  No focal plaque accumulation or stenosis. Normal waveforms and color Doppler signal. RIGHT VERTEBRAL ARTERY:  Normal flow direction and waveform. LEFT CAROTID ARTERY: Intimal thickening. Mild eccentric partially calcified plaque in the proximal ICA. No high-grade stenosis. Normal waveforms and color Doppler signal. LEFT VERTEBRAL ARTERY: Normal flow direction and waveform. IMPRESSION: 1. Mild proximal left ICA plaque resulting in less than 50% diameter stenosis. The exam does not exclude plaque ulceration or embolization. Continued surveillance recommended. Electronically Signed   By: Lucrezia Europe M.D.   On: 05/07/2015 09:51   Mr Jodene Nam Head/brain Wo Cm  05/07/2015  CLINICAL DATA:  Stroke.  Right arm weakness. EXAM: MRA HEAD WITHOUT CONTRAST TECHNIQUE: Angiographic images of the Circle of Willis were obtained using MRA technique without intravenous contrast. COMPARISON:  MRI head 05/06/2015 FINDINGS: Both vertebral arteries patent to the basilar without stenosis. PICA not visualized. AICA patent bilaterally. Basilar widely patent. Superior cerebellar and posterior cerebral arteries patent. Internal carotid artery widely patent bilaterally without stenosis. Anterior and middle cerebral arteries widely patent bilaterally without stenosis. Negative for vascular malformation. IMPRESSION: Negative MRA head Electronically Signed   By: Franchot Gallo M.D.   On: 05/07/2015 09:09     Management plans discussed with the patient, family and they are in agreement.  CODE STATUS:     Code Status Orders        Start      Ordered   05/07/15 0004  Full code   Continuous     05/07/15 0003    Code Status History    Date Active Date Inactive Code Status Order ID Comments User Context   This patient has a current code status but no historical code status.      TOTAL TIME TAKING CARE OF THIS PATIENT: 28 minutes.    Angelina Venard,  Karenann Cai.D on 05/07/2015 at 12:46 PM  Between 7am to 6pm - Pager - [phone]  After 6pm go to [email or web address] - password [email or web address] [facility]  [facility]  Office  [phone]  CC: Primary care physician; Loura Pardon, MD
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
## 2015-05-07 NOTE — Progress Notes (Signed)
OT Cancellation Note  Patient Details Name: Valentyna Hahne MRN: [id number] DOB: [birth year]   Cancelled Treatment:    Reason Eval/Treat Not Completed: Patient at procedure or test/ unavailable Pt out of room for imaging.  Will attempt again later today time permitting or tomorrow.  Chrys Racer, OTR/L ascom [phone]   Kaileigh Viswanathan 05/07/2015, 9:55 AM

## 2015-05-07 NOTE — Progress Notes (Signed)
Pt being discharged today, discharge instructions reviewed with pt, she verified understanding. IV removed, belongings returned, she was rolled out in wheelchair by staff.

## 2015-05-07 NOTE — Progress Notes (Signed)
*  PRELIMINARY RESULTS* Echocardiogram 2D Echocardiogram has been performed.  [name] 05/07/2015, 1:41 PM

## 2015-05-07 NOTE — Consult Note (Signed)
Referring Physician: Hower     Chief Complaint: Right sided weakness  HPI: [place] is an 75 y.o. female who reports that on 3/13 she had a stressful day.  When she got home the evening she states that she is not feeling very well and that she was dragging her right foot.  Right arm was heavy and more difficult to control.  This got some better on yesterday but while talking with her vet was encouraged to go to the doctor.  Patietn presented to the ED for further evaluation.  Initial NIHSS of 0.    Date last known well: 05/05/2015 Time last known well: Time: 16:00 tPA Given: No: Outside time window  Modified Rankin: Rankin Score=0  Past Medical History  Diagnosis Date  . Hyperlipidemia   . Osteoporosis   . IC (interstitial cystitis)   . Heel spur   . IBS (irritable bowel syndrome)   . GERD (gastroesophageal reflux disease)   . Hyperglycemia   . Vitamin D deficiency   . Insomnia disorder related to known organic factor   . Coronary artery disease, non-occlusive   . HSV (herpes simplex virus) infection     Past Surgical History  Procedure Laterality Date  . Rotator cuff repair      2 TIMES  . Abdominal hysterectomy    . Cholecystectomy    . Cardiac catheterization  7/12    non obst dz    Family History  Problem Relation Age of Onset  . Alcohol abuse Mother   . Heart attack Mother   . GER disease Mother   . GER disease Father   . Alcohol abuse Father   . Stroke Father   . Tuberculosis Father   . Heart attack Father   . Aneurysm Father   . Cancer Sister     FEMALE  . Mental illness Sister   . Depression Sister    Social History:  reports that she has never smoked. She does not have any smokeless tobacco history on file. She reports that she drinks alcohol. She reports that she does not use illicit drugs.  Allergies:  Allergies  Allergen Reactions  . Ampicillin Other (See Comments)    Reaction:  Unknown   . Codeine Nausea And Vomiting  . Meperidine  Hcl Nausea And Vomiting  . Penicillins Other (See Comments)    Reaction:  Unknown   . Propoxyphene Hcl Nausea And Vomiting  . Sulfa Antibiotics Hives  . Tramadol Hcl Nausea And Vomiting    Medications:  I have reviewed the patient's current medications. Prior to Admission:  Prescriptions prior to admission  Medication Sig Dispense Refill Last Dose  . aspirin EC 325 MG tablet Take 325 mg by mouth at bedtime.    05/05/2015 at [facility]   . Biotin 5000 MCG CAPS Take 5,000 mcg by mouth daily.   05/05/2015 at Unknown time  . cholecalciferol (VITAMIN D) 1000 units tablet Take 1,000 Units by mouth daily.   05/05/2015 at Unknown time  . COMBIGAN 0.2-0.5 % ophthalmic solution Place 1 drop into both eyes 2 (two) times daily.  2 05/05/2015 at Unknown time  . LUMIGAN 0.01 % SOLN Place 1 drop into both eyes at bedtime.   05/05/2015 at Unknown time  . Multiple Vitamin (MULTIVITAMIN WITH MINERALS) TABS tablet Take 1 tablet by mouth daily.   05/05/2015 at Unknown time  . doxycycline (VIBRA-TABS) 100 MG tablet Take 1 tablet (100 mg total) by mouth 2 (two) times daily. (Patient not  taking: Reported on 05/06/2015) 20 tablet 0    Scheduled: . aspirin EC  325 mg Oral QHS  . atorvastatin  80 mg Oral q1800  . brimonidine  1 drop Both Eyes BID   And  . timolol  1 drop Both Eyes BID  . enoxaparin (LOVENOX) injection  40 mg Subcutaneous Q24H  . latanoprost  1 drop Both Eyes QHS  . traZODone  50 mg Oral QHS    ROS: History obtained from the patient  General ROS: negative for - chills, fatigue, fever, night sweats, weight gain or weight loss Psychological ROS: negative for - behavioral disorder, hallucinations, memory difficulties, mood swings or suicidal ideation Ophthalmic ROS: negative for - blurry vision, double vision, eye pain or loss of vision ENT ROS: negative for - epistaxis, nasal discharge, oral lesions, sore throat, tinnitus or vertigo Allergy and Immunology ROS: negative for - hives or itchy/watery  eyes Hematological and Lymphatic ROS: negative for - bleeding problems, bruising or swollen lymph nodes Endocrine ROS: negative for - galactorrhea, hair pattern changes, polydipsia/polyuria or temperature intolerance Respiratory ROS: negative for - cough, hemoptysis, shortness of breath or wheezing Cardiovascular ROS: negative for - chest pain, dyspnea on exertion, edema or irregular heartbeat Gastrointestinal ROS: constipation Genito-Urinary ROS: negative for - dysuria, hematuria, incontinence or urinary frequency/urgency Musculoskeletal ROS: negative for - joint swelling or muscular weakness Neurological ROS: as noted in HPI Dermatological ROS: negative for rash and skin lesion changes  Physical Examination: Blood pressure 95/53, pulse 61, temperature 98.8 F (37.1 C), temperature source Oral, resp. rate 20, height 5' 6.5" (1.689 m), weight 73.664 kg (162 lb 6.4 oz), SpO2 98 %.  HEENT-  Normocephalic, no lesions, without obvious abnormality.  Normal external eye and conjunctiva.  Normal TM's bilaterally.  Normal auditory canals and external ears. Normal external nose, mucus membranes and septum.  Normal pharynx. Cardiovascular- S1, S2 normal, pulses palpable throughout   Lungs- chest clear, no wheezing, rales, normal symmetric air entry Abdomen- soft, non-tender; bowel sounds normal; no masses,  no organomegaly Extremities- no edema Lymph-no adenopathy palpable Musculoskeletal-no joint tenderness or swelling.  Right toe deformities Skin-warm and dry, no hyperpigmentation, vitiligo, or suspicious lesions  Neurological Examination Mental Status: Alert, oriented, thought content appropriate.  Speech fluent without evidence of aphasia.  Able to follow 3 step commands without difficulty. Cranial Nerves: II: Discs flat bilaterally; Visual fields grossly normal, pupils equal, round, reactive to light and accommodation III,IV, VI: ptosis not present, extra-ocular motions intact  bilaterally V,VII: smile symmetric, facial light touch sensation normal bilaterally VIII: hearing normal bilaterally IX,X: gag reflex present XI: bilateral shoulder shrug XII: midline tongue extension Motor: Right : Upper extremity   5/5    Left:     Upper extremity   5/5  Lower extremity   5-/5     Lower extremity   5/5 Tone and bulk:normal tone throughout; no atrophy noted Sensory: Pinprick and light touch intact throughout, bilaterally Deep Tendon Reflexes: 2+ throughout with absent AJ's bilaterally Plantars: Right: downgoing   Left: downgoing Cerebellar: Normal finger-to-nose and normal heel-to-shin testing bilaterally Gait: not tested due to safety concerns  Laboratory Studies:  Basic Metabolic Panel:  Recent Labs Lab 05/06/15 1536 05/07/15 0010  NA 139  --   K 3.8  --   CL 106  --   CO2 28  --   GLUCOSE 96  --   BUN 17  --   CREATININE 0.83 0.82  CALCIUM 9.6  --     Liver  Function Tests:  Recent Labs Lab 05/06/15 1536  AST 23  ALT 24  ALKPHOS 72  BILITOT 0.6  PROT 7.1  ALBUMIN 4.1   No results for input(s): LIPASE, AMYLASE in the last 168 hours. No results for input(s): AMMONIA in the last 168 hours.  CBC:  Recent Labs Lab 05/06/15 1536 05/07/15 0010  WBC 6.9 7.3  NEUTROABS 4.3  --   HGB 13.2 12.8  HCT 39.0 37.9  MCV 87.8 88.1  PLT 259 235    Cardiac Enzymes:  Recent Labs Lab 05/07/15 0010  TROPONINI <0.03    BNP: Invalid input(s): POCBNP  CBG: No results for input(s): GLUCAP in the last 168 hours.  Microbiology: No results found for this or any previous visit.  Coagulation Studies:  Recent Labs  05/06/15 1536  LABPROT 13.0  INR 0.96    Urinalysis: No results for input(s): COLORURINE, LABSPEC, PHURINE, GLUCOSEU, HGBUR, BILIRUBINUR, KETONESUR, PROTEINUR, UROBILINOGEN, NITRITE, LEUKOCYTESUR in the last 168 hours.  Invalid input(s): APPERANCEUR  Lipid Panel:    Component Value Date/Time   CHOL 202* 05/07/2015 0507    TRIG 74 05/07/2015 0507   HDL 58 05/07/2015 0507   CHOLHDL 3.5 05/07/2015 0507   VLDL 15 05/07/2015 0507   LDLCALC 129* 05/07/2015 0507    HgbA1C:  Lab Results  Component Value Date   HGBA1C 5.9 02/14/2015    Urine Drug Screen:  No results found for: LABOPIA, COCAINSCRNUR, LABBENZ, AMPHETMU, THCU, LABBARB  Alcohol Level: No results for input(s): ETH in the last 168 hours.  Other results: EKG: normal sinus rhythm at 69 bpm.  Imaging: Dg Chest 2 View  05/07/2015  CLINICAL DATA:  Right-sided weakness yesterday, stroke. Hypertension. EXAM: CHEST  2 VIEW COMPARISON:  None. FINDINGS: Heart and mediastinal contours are within normal limits. No focal opacities or effusions. No acute bony abnormality. IMPRESSION: No active cardiopulmonary disease. Electronically Signed   By: Rolm Baptise M.D.   On: 05/07/2015 09:08   Ct Head Wo Contrast  05/06/2015  CLINICAL DATA:  Right-sided weakness and weakness since yesterday. EXAM: CT HEAD WITHOUT CONTRAST TECHNIQUE: Contiguous axial images were obtained from the base of the skull through the vertex without intravenous contrast. COMPARISON:  None. FINDINGS: There is no evidence of mass effect, midline shift, or extra-axial fluid collections. There is no evidence of a space-occupying lesion or intracranial hemorrhage. There is no evidence of a cortical-based area of acute infarction. There is generalized cerebral atrophy. There is periventricular white matter low attenuation likely secondary to microangiopathy. The ventricles and sulci are appropriate for the patient's age. The basal cisterns are patent. Visualized portions of the orbits are unremarkable. The visualized portions of the paranasal sinuses and mastoid air cells are unremarkable. The osseous structures are unremarkable. IMPRESSION: No acute intracranial pathology. Electronically Signed   By: Kathreen Devoid   On: 05/06/2015 15:49   Mr Brain Wo Contrast  05/06/2015  CLINICAL DATA:  75 year old female  with right-sided weakness since yesterday. Subsequent encounter. EXAM: MRI HEAD WITHOUT CONTRAST TECHNIQUE: Multiplanar, multiecho pulse sequences of the brain and surrounding structures were obtained without intravenous contrast. COMPARISON:  05/06/2015 head CT.  No comparison brain MR. 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 Moderate small vessel disease changes most notable right frontal lobe. No intracranial hemorrhage. Global mild atrophy without hydrocephalus. Major intracranial vascular structures are patent. No intracranial mass lesion noted on this unenhanced exam. Post lens replacement otherwise orbital structures unremarkable. Small pituitary gland.  Cervical medullary junction  unremarkable. Minimal ethmoid sinus air cell mucosal thickening. 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 Moderate small vessel disease changes most notable right frontal lobe. Global mild atrophy. Electronically Signed   By: Genia Del M.D.   On: 05/06/2015 20:26   US Carotid Bilateral  05/07/2015  CLINICAL DATA:  Stroke.  Hypertension, syncope, hyperlipidemia. EXAM: BILATERAL CAROTID DUPLEX ULTRASOUND TECHNIQUE: Pearline Cables scale imaging, color Doppler and duplex ultrasound was performed of bilateral carotid and vertebral arteries in the neck. COMPARISON:  None. REVIEW OF SYSTEMS: Quantification of carotid stenosis is based on velocity parameters that correlate the residual internal carotid diameter with NASCET-based stenosis levels, using the diameter of the distal internal carotid lumen as the denominator for stenosis measurement. The following velocity measurements were obtained: PEAK SYSTOLIC/END DIASTOLIC RIGHT ICA:                     86/16cm/sec CCA:                     [id number] SYSTOLIC ICA/CCA RATIO:  0.8 DIASTOLIC ICA/CCA RATIO: 0.9 ECA:                     171cm/sec LEFT ICA:                     80/20cm/sec CCA:                      [id number] SYSTOLIC ICA/CCA RATIO:  0.8 DIASTOLIC ICA/CCA RATIO: 1.3 ECA:                     125cm/sec FINDINGS: RIGHT CAROTID ARTERY: Mild intimal thickening. No focal plaque accumulation or stenosis. Normal waveforms and color Doppler signal. RIGHT VERTEBRAL ARTERY:  Normal flow direction and waveform. LEFT CAROTID ARTERY: Intimal thickening. Mild eccentric partially calcified plaque in the proximal ICA. No high-grade stenosis. Normal waveforms and color Doppler signal. LEFT VERTEBRAL ARTERY: Normal flow direction and waveform. IMPRESSION: 1. Mild proximal left ICA plaque resulting in less than 50% diameter stenosis. The exam does not exclude plaque ulceration or embolization. Continued surveillance recommended. Electronically Signed   By: Lucrezia Europe M.D.   On: 05/07/2015 09:51   Mr Jodene Nam Head/brain Wo Cm  05/07/2015  CLINICAL DATA:  Stroke.  Right arm weakness. EXAM: MRA HEAD WITHOUT CONTRAST TECHNIQUE: Angiographic images of the Circle of Willis were obtained using MRA technique without intravenous contrast. COMPARISON:  MRI head 05/06/2015 FINDINGS: Both vertebral arteries patent to the basilar without stenosis. PICA not visualized. AICA patent bilaterally. Basilar widely patent. Superior cerebellar and posterior cerebral arteries patent. Internal carotid artery widely patent bilaterally without stenosis. Anterior and middle cerebral arteries widely patent bilaterally without stenosis. Negative for vascular malformation. IMPRESSION: Negative MRA head Electronically Signed   By: Franchot Gallo M.D.   On: 05/07/2015 09:09    Assessment: 75 y.o. female presenting with right sided weakness that has improved since its onset.  Currently mild.  MRI of the brain personally reviewed and shows an acute small left frontal infarct.  MRA is unremarkable.  Echocardiogram and A1c pending.  Carotid doppler shows no hemodynamically significant stenosis.  LDL elevated at 129.  Stroke Risk Factors -  hyperlipidemia  Plan: 1. Statin for lipid treatment with goal of less thasn 70.  A1c pending 2. PT consult, OT consult, Speech consult 3. Echocardiogram pending 4. Prophylactic therapy-Antiplatelet med: Plavix - dose 75mg  daily 5. Telemetry  monitoring 6. Frequent neuro checks   Alexis Goodell, MD Neurology [phone] 05/07/2015, 11:10 AM

## 2015-05-08 ENCOUNTER — Telehealth: Payer: Self-pay | Admitting: *Deleted

## 2015-05-08 ENCOUNTER — Telehealth: Payer: Self-pay

## 2015-05-08 MED ORDER — CLOPIDOGREL BISULFATE 75 MG PO TABS: 75.0000 mg | ORAL_TABLET | Freq: Every day | ORAL | Status: DC

## 2015-05-08 MED ORDER — ATORVASTATIN CALCIUM 80 MG PO TABS: 80.0000 mg | ORAL_TABLET | Freq: Every day | ORAL | Status: DC

## 2015-05-08 NOTE — Telephone Encounter (Signed)
Initial TCM - Left voicemail msg with contact info.

## 2015-05-08 NOTE — Telephone Encounter (Signed)
Pt left voicemail saying hospital forgot to give her the paper Rxs for her plavix and Lipitor and she needs meds. Rx sent to pharmacy and I left a voicemail letting pt know Rxs were sent

## 2015-05-09 NOTE — Telephone Encounter (Signed)
Second TCM call - husband answered call. Stated home number is no longer valid. Stated patient was outside feeding horses.   Transition Care Management Follow-up Telephone Call     Date discharged? 05/07/2015   How have you been since you were released from the hospital? Health status improving   Any patient concerns? None   Do you understand why you were in the hospital? Yes   Do you understand the discharge instructions? Yes   Where were you discharged to? Home   Items Reviewed:  Medications reviewed: No, patient is not available  Allergies reviewed: No, patient is not available   Dietary changes reviewed: No, patient is not available  Referrals reviewed: No   Functional Questionnaire:  Independent - I Dependent - D    Activities of Daily Living (ADLs):    Personal hygiene - I Dressing - I Eating  - I Maintaining continence - I Transferring - I  Independent Activities of Daily Living (ADLs): Basic communication skills - I Transportation - I Meal preparation - I  Shopping - I Housework - I  Managing medications - I Managing personal finances - I   Confirmed importance and date/time of follow-up visits scheduled YES  Provider Appointment booked with PCP - yes  Confirmed with patient if condition begins to worsen call PCP or go to the ER.  Patient was given the office number and encouraged to call back with question or concerns: YES

## 2015-05-12 ENCOUNTER — Ambulatory Visit (INDEPENDENT_AMBULATORY_CARE_PROVIDER_SITE_OTHER): Payer: Commercial Managed Care - HMO | Admitting: Family Medicine

## 2015-05-12 ENCOUNTER — Encounter: Payer: Self-pay | Admitting: Family Medicine

## 2015-05-12 VITALS — BP 112/68 | HR 65 | Temp 97.5°F | Ht 64.0 in | Wt 165.0 lb

## 2015-05-12 DIAGNOSIS — E785 Hyperlipidemia, unspecified: Secondary | ICD-10-CM | POA: Diagnosis not present

## 2015-05-12 DIAGNOSIS — I6300 Cerebral infarction due to thrombosis of unspecified precerebral artery: Secondary | ICD-10-CM | POA: Diagnosis not present

## 2015-05-12 DIAGNOSIS — I10 Essential (primary) hypertension: Secondary | ICD-10-CM

## 2015-05-12 DIAGNOSIS — R7309 Other abnormal glucose: Secondary | ICD-10-CM

## 2015-05-12 MED ORDER — CLOPIDOGREL BISULFATE 75 MG PO TABS: 75.0000 mg | ORAL_TABLET | Freq: Every day | ORAL | Status: DC

## 2015-05-12 MED ORDER — ATORVASTATIN CALCIUM 80 MG PO TABS: 80.0000 mg | ORAL_TABLET | Freq: Every day | ORAL | Status: DC

## 2015-05-12 NOTE — Assessment & Plan Note (Signed)
Lab Results  Component Value Date   HGBA1C 5.5 05/07/2015   Disc imp of low glycemic diet and wt control She is quite active  Continue to follow

## 2015-05-12 NOTE — Progress Notes (Signed)
Pre visit review using our clinic review tool, if applicable. No additional management support is needed unless otherwise documented below in the visit note. 

## 2015-05-12 NOTE — Patient Instructions (Addendum)
Continue lipitor - it is really important to get cholesterol very low to prevent another stroke Eat a low fat diet    Avoid red meat/ fried foods/ egg yolks/ fatty breakfast meats/ butter, cheese and high fat dairy/ and shellfish   Also decrease your aspirin to 81 mg (baby aspirin) daily with food - if your stomach continues to bother you then stop it  Continue plavix  If you develop any more stroke symptoms -go to the ER immediately (call 911)  Take care of yourself  Schedule fasting labs in about a month for your cholesterol   I'm glad you are doing better !

## 2015-05-12 NOTE — Assessment & Plan Note (Signed)
Pt has no bp problems currently (has had recent CVA)- will continue to watch this  No no bp med BP Readings from Last 3 Encounters:  05/12/15 112/68  05/07/15 95/53  02/21/15 132/60    Disc sodium in diet

## 2015-05-12 NOTE — Assessment & Plan Note (Signed)
Mild- but given recent cva tx aggressively with atorvastatin 80 Goal LDL under 70 Re check 1 mo  Rev low sat fat diet also

## 2015-05-12 NOTE — Progress Notes (Signed)
Subjective:    Patient ID: [name], female    DOB: 1941/02/17, 75 y.o.   MRN: [id number]  HPI Here for f/u of hosp for CVA  hosp 3/14-/05/07/15 for acute ischemic stroke (too late for TPA) Medial posterior L frontal lobe cva seen on MRI (non hemorrhagic)   She presented with R sided weakness  This resolved in the hospital  Dg Chest 2 View  05/07/2015  CLINICAL DATA:  Right-sided weakness yesterday, stroke. Hypertension. EXAM: CHEST  2 VIEW COMPARISON:  None. FINDINGS: Heart and mediastinal contours are within normal limits. No focal opacities or effusions. No acute bony abnormality. IMPRESSION: No active cardiopulmonary disease. Electronically Signed   By: Rolm Baptise M.D.   On: 05/07/2015 09:08   Ct Head Wo Contrast  05/06/2015  CLINICAL DATA:  Right-sided weakness and weakness since yesterday. EXAM: CT HEAD WITHOUT CONTRAST TECHNIQUE: Contiguous axial images were obtained from the base of the skull through the vertex without intravenous contrast. COMPARISON:  None. FINDINGS: There is no evidence of mass effect, midline shift, or extra-axial fluid collections. There is no evidence of a space-occupying lesion or intracranial hemorrhage. There is no evidence of a cortical-based area of acute infarction. There is generalized cerebral atrophy. There is periventricular white matter low attenuation likely secondary to microangiopathy. The ventricles and sulci are appropriate for the patient's age. The basal cisterns are patent. Visualized portions of the orbits are unremarkable. The visualized portions of the paranasal sinuses and mastoid air cells are unremarkable. The osseous structures are unremarkable. IMPRESSION: No acute intracranial pathology. Electronically Signed   By: Kathreen Devoid   On: 05/06/2015 15:49   Mr Brain Wo Contrast  05/06/2015  CLINICAL DATA:  75 year old female with right-sided weakness since yesterday. Subsequent encounter. EXAM: MRI HEAD WITHOUT CONTRAST  TECHNIQUE: Multiplanar, multiecho pulse sequences of the brain and surrounding structures were obtained without intravenous contrast. COMPARISON:  05/06/2015 head CT.  No comparison brain MR. 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 Moderate small vessel disease changes most notable right frontal lobe. No intracranial hemorrhage. Global mild atrophy without hydrocephalus. Major intracranial vascular structures are patent. No intracranial mass lesion noted on this unenhanced exam. Post lens replacement otherwise orbital structures unremarkable. Small pituitary gland.  Cervical medullary junction unremarkable. Minimal ethmoid sinus air cell mucosal thickening. 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 Moderate small vessel disease changes most notable right frontal lobe. Global mild atrophy. Electronically Signed   By: Genia Del M.D.   On: 05/06/2015 20:26   US Carotid Bilateral  05/07/2015  CLINICAL DATA:  Stroke.  Hypertension, syncope, hyperlipidemia. EXAM: BILATERAL CAROTID DUPLEX ULTRASOUND TECHNIQUE: Pearline Cables scale imaging, color Doppler and duplex ultrasound was performed of bilateral carotid and vertebral arteries in the neck. COMPARISON:  None. REVIEW OF SYSTEMS: Quantification of carotid stenosis is based on velocity parameters that correlate the residual internal carotid diameter with NASCET-based stenosis levels, using the diameter of the distal internal carotid lumen as the denominator for stenosis measurement. The following velocity measurements were obtained: PEAK SYSTOLIC/END DIASTOLIC RIGHT ICA:                     86/16cm/sec CCA:                     [id number] SYSTOLIC ICA/CCA RATIO:  0.8 DIASTOLIC ICA/CCA RATIO: 0.9 ECA:  171cm/sec LEFT ICA:                     80/20cm/sec CCA:                     [id number] SYSTOLIC ICA/CCA RATIO:  0.8 DIASTOLIC ICA/CCA RATIO: 1.3 ECA:                      125cm/sec FINDINGS: RIGHT CAROTID ARTERY: Mild intimal thickening. No focal plaque accumulation or stenosis. Normal waveforms and color Doppler signal. RIGHT VERTEBRAL ARTERY:  Normal flow direction and waveform. LEFT CAROTID ARTERY: Intimal thickening. Mild eccentric partially calcified plaque in the proximal ICA. No high-grade stenosis. Normal waveforms and color Doppler signal. LEFT VERTEBRAL ARTERY: Normal flow direction and waveform. IMPRESSION: 1. Mild proximal left ICA plaque resulting in less than 50% diameter stenosis. The exam does not exclude plaque ulceration or embolization. Continued surveillance recommended. Electronically Signed   By: Lucrezia Europe M.D.   On: 05/07/2015 09:51   Mr Jodene Nam Head/brain Wo Cm  05/07/2015  CLINICAL DATA:  Stroke.  Right arm weakness. EXAM: MRA HEAD WITHOUT CONTRAST TECHNIQUE: Angiographic images of the Circle of Willis were obtained using MRA technique without intravenous contrast. COMPARISON:  MRI head 05/06/2015 FINDINGS: Both vertebral arteries patent to the basilar without stenosis. PICA not visualized. AICA patent bilaterally. Basilar widely patent. Superior cerebellar and posterior cerebral arteries patent. Internal carotid artery widely patent bilaterally without stenosis. Anterior and middle cerebral arteries widely patent bilaterally without stenosis. Negative for vascular malformation. IMPRESSION: Negative MRA head Electronically Signed   By: Franchot Gallo M.D.   On: 05/07/2015 09:09    Echocardiogram-no source of embolus found  Carotid doppler 50 % stenosis ICA on L   Put on lipitor Lab Results  Component Value Date   CHOL 202* 05/07/2015   HDL 58 05/07/2015   LDLCALC 129* 05/07/2015   LDLDIRECT 138.9 07/05/2012   TRIG 74 05/07/2015   CHOLHDL 3.5 05/07/2015   Also plavix  Already on asa 325-now bothering her stomach- asks if she can dec to baby asa    BP Readings from Last 3 Encounters:  05/12/15 112/68  05/07/15 95/53  02/21/15 132/60        Chemistry      Component Value Date/Time   NA 139 05/06/2015 1536   K 3.8 05/06/2015 1536   CL 106 05/06/2015 1536   CO2 28 05/06/2015 1536   BUN 17 05/06/2015 1536   CREATININE 0.82 05/07/2015 0010      Component Value Date/Time   CALCIUM 9.6 05/06/2015 1536   ALKPHOS 72 05/06/2015 1536   AST 23 05/06/2015 1536   ALT 24 05/06/2015 1536   BILITOT 0.6 05/06/2015 1536      Lab Results  Component Value Date   WBC 7.3 05/07/2015   HGB 12.8 05/07/2015   HCT 37.9 05/07/2015   MCV 88.1 05/07/2015   PLT 235 05/07/2015   Lab Results  Component Value Date   TSH 1.94 02/14/2015    Lab Results  Component Value Date   HGBA1C 5.5 05/07/2015   Disc limiting sugar/sweets in diet to prev DM  Patient Active Problem List   Diagnosis Date Noted  . Stroke (cerebrum) ([place]) 05/06/2015  . HTN (hypertension) 05/06/2015  . HLD (hyperlipidemia) 05/06/2015  . GERD (gastroesophageal reflux disease) 05/06/2015  . CAD (coronary artery disease) 05/06/2015  . Maxillary sinusitis, acute 02/24/2015  . Insect bites 09/17/2014  . Rectal  bleeding 09/25/2013  . Joint pain 05/01/2013  . Tick bite of multiple sites 05/01/2013  . Pain in joint, ankle and foot 03/02/2013  . Seborrheic keratosis 07/05/2012  . Ankle joint pain 07/16/2011  . 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 Callus of foot 10/16/2010  . Coronary artery disease, non-occlusive 10/16/2010  . INSOMNIA 02/02/2010  . HAND PAIN, BILATERAL 01/09/2010  . CRAMP OF LIMB 01/09/2010  . HYPERGLYCEMIA 07/29/2009  . ALLERGIC RHINITIS 12/04/2008  . UNSPECIFIED VITAMIN D DEFICIENCY 04/30/2008  . COLONIC POLYPS 06/28/2007  . Hyperlipidemia 03/17/2007  . IBS 03/17/2007  . INTERSTITIAL CYSTITIS 03/17/2007  . OSTEOPOROSIS 03/17/2007   Past Medical History  Diagnosis Date  . Hyperlipidemia   . Osteoporosis   . IC (interstitial cystitis)   . Heel spur   . IBS (irritable bowel syndrome)   . GERD (gastroesophageal reflux disease)   .  Hyperglycemia   . Vitamin D deficiency   . Insomnia disorder related to known organic factor   . Coronary artery disease, non-occlusive   . HSV (herpes simplex virus) infection    Past Surgical History  Procedure Laterality Date  . Rotator cuff repair      2 TIMES  . Abdominal hysterectomy    . Cholecystectomy    . Cardiac catheterization  7/12    non obst dz   Social History  Substance Use Topics  . Smoking status: Never Smoker   . Smokeless tobacco: None  . Alcohol Use: 0.0 oz/week    0 Standard drinks or equivalent per week     Comment: rare   Family History  Problem Relation Age of Onset  . Alcohol abuse Mother   . Heart attack Mother   . GER disease Mother   . GER disease Father   . Alcohol abuse Father   . Stroke Father   . Tuberculosis Father   . Heart attack Father   . Aneurysm Father   . Cancer Sister     FEMALE  . Mental illness Sister   . Depression Sister    Allergies  Allergen Reactions  . Ampicillin Other (See Comments)    Reaction:  Unknown   . Codeine Nausea And Vomiting  . Meperidine Hcl Nausea And Vomiting  . Penicillins Other (See Comments)    Reaction:  Unknown   . Propoxyphene Hcl Nausea And Vomiting  . Sulfa Antibiotics Hives  . Tramadol Hcl Nausea And Vomiting   Current Outpatient Prescriptions on File Prior to Visit  Medication Sig Dispense Refill  . Biotin 5000 MCG CAPS Take 5,000 mcg by mouth daily.    . cholecalciferol (VITAMIN D) 1000 units tablet Take 1,000 Units by mouth daily.    . COMBIGAN 0.2-0.5 % ophthalmic solution Place 1 drop into both eyes 2 (two) times daily.  2  . LUMIGAN 0.01 % SOLN Place 1 drop into both eyes at bedtime.    . Multiple Vitamin (MULTIVITAMIN WITH MINERALS) TABS tablet Take 1 tablet by mouth daily.     No current facility-administered medications on file prior to visit.     Review of Systems Review of Systems  Constitutional: Negative for fever, appetite change, fatigue and unexpected weight  change.  Eyes: Negative for pain and visual disturbance.  Respiratory: Negative for cough and shortness of breath.   Cardiovascular: Negative for cp or palpitations    Gastrointestinal: Negative for nausea, diarrhea and constipation. pos for stomach upset from asa  Genitourinary: Negative for urgency and frequency.  Skin: Negative for pallor or  rash   Neurological: Negative for weakness, light-headedness, numbness and headaches. (R sided weakness is resolved) , pos for buzzing feeling in head w/o headache, neg for facial droop or speech problems  Hematological: Negative for adenopathy. Does not bruise/bleed easily.  Psychiatric/Behavioral: Negative for dysphoric mood. The patient is not nervous/anxious.  pos for stressors and poor sleep       Objective:   Physical Exam  Constitutional: She appears well-developed and well-nourished. No distress.  Well appearing/overweight/ poor hygiene (baseline)  HENT:  Head: Normocephalic and atraumatic.  Mouth/Throat: Oropharynx is clear and moist.  No facial assymetry  Eyes: Conjunctivae and EOM are normal. Pupils are equal, round, and reactive to light.  Neck: Normal range of motion. Neck supple. No JVD present. Carotid bruit is not present. No thyromegaly present.  Cardiovascular: Normal rate, regular rhythm, normal heart sounds and intact distal pulses.  Exam reveals no gallop.   Pulmonary/Chest: Effort normal and breath sounds normal. No respiratory distress. She has no wheezes. She has no rales.  No crackles  Abdominal: Soft. Bowel sounds are normal. She exhibits no distension, no abdominal bruit and no mass. There is no tenderness.  Musculoskeletal: She exhibits no edema or tenderness.  Lymphadenopathy:    She has no cervical adenopathy.  Neurological: She is alert. She has normal strength and normal reflexes. She displays no atrophy and no tremor. No cranial nerve deficit or sensory deficit. She exhibits normal muscle tone. She displays a  negative Romberg sign. Coordination and gait normal.  Symmetric sens and strength today No ataxia No facial droop  Skin: Skin is warm and dry. No rash noted.  Psychiatric: She has a normal mood and affect.  Pt disc her sleep problems and stressors           Assessment & Plan:   Problem List Items Addressed This Visit      Cardiovascular and Mediastinum   HTN (hypertension) - Primary    Pt has no bp problems currently (has had recent CVA)- will continue to watch this  No no bp med BP Readings from Last 3 Encounters:  05/12/15 112/68  05/07/15 95/53  02/21/15 132/60    Disc sodium in diet       Relevant Medications   atorvastatin (LIPITOR) 80 MG tablet   aspirin 81 MG tablet   Stroke (cerebrum) ([facility])    Non hemorrhagic L post frontal lobe  Pt presented with R sided weakness that is now resolved Rev hosp records/ imaging/labs/echo and carotid dopplers No source of embolus found  Nl exam today  Disc goal to keep cholesterol aggressively controlled Continue plavix and asa Intol of 325-will reduce asa to 81 mg and take with food (update with problems)  inst to go straight to ED if return of any stroke symptoms (reviewed)-she voiced understanding       Relevant Medications   atorvastatin (LIPITOR) 80 MG tablet   aspirin 81 MG tablet     Other   HYPERGLYCEMIA    Lab Results  Component Value Date   HGBA1C 5.5 05/07/2015   Disc imp of low glycemic diet and wt control She is quite active  Continue to follow       Hyperlipidemia    Mild- but given recent cva tx aggressively with atorvastatin 80 Goal LDL under 70 Re check 1 mo  Rev low sat fat diet also       Relevant Medications   atorvastatin (LIPITOR) 80 MG tablet   aspirin 81 MG tablet

## 2015-05-12 NOTE — Assessment & Plan Note (Signed)
Non hemorrhagic L post frontal lobe  Pt presented with R sided weakness that is now resolved Rev hosp records/ imaging/labs/echo and carotid dopplers No source of embolus found  Nl exam today  Disc goal to keep cholesterol aggressively controlled Continue plavix and asa Intol of 325-will reduce asa to 81 mg and take with food (update with problems)  inst to go straight to ED if return of any stroke symptoms (reviewed)-she voiced understanding

## 2015-05-14 ENCOUNTER — Ambulatory Visit: Payer: Commercial Managed Care - HMO | Admitting: Family Medicine

## 2015-05-23 ENCOUNTER — Ambulatory Visit: Payer: Commercial Managed Care - HMO | Admitting: Sports Medicine

## 2015-05-28 ENCOUNTER — Other Ambulatory Visit (INDEPENDENT_AMBULATORY_CARE_PROVIDER_SITE_OTHER): Payer: Commercial Managed Care - HMO

## 2015-05-28 DIAGNOSIS — E785 Hyperlipidemia, unspecified: Secondary | ICD-10-CM | POA: Diagnosis not present

## 2015-05-28 LAB — LIPID PANEL
Cholesterol: 147 mg/dL (ref 0–200)
HDL: 63.7 mg/dL (ref >39.00)
LDL Cholesterol: 66 mg/dL (ref 0–99)
NonHDL: 83.27
Total CHOL/HDL Ratio: 2
Triglycerides: 86 mg/dL (ref 0.0–149.0)
VLDL: 17.2 mg/dL (ref 0.0–40.0)

## 2015-05-28 LAB — AST: AST: 34 U/L (ref 0–37)

## 2015-05-28 LAB — ALT: ALT: 64 U/L — ABNORMAL HIGH (ref 0–35)

## 2015-06-03 MED ORDER — ATORVASTATIN CALCIUM 80 MG PO TABS: 40.0000 mg | ORAL_TABLET | Freq: Every day | ORAL | Status: DC

## 2015-06-03 NOTE — Addendum Note (Signed)
Addended by: Tammi Sou on: 06/03/2015 04:25 PM   Modules accepted: Orders

## 2015-06-05 ENCOUNTER — Other Ambulatory Visit: Payer: Self-pay | Admitting: Family Medicine

## 2015-06-05 DIAGNOSIS — Z7289 Other problems related to lifestyle: Secondary | ICD-10-CM

## 2015-06-05 DIAGNOSIS — Z1159 Encounter for screening for other viral diseases: Secondary | ICD-10-CM

## 2015-06-05 DIAGNOSIS — E785 Hyperlipidemia, unspecified: Secondary | ICD-10-CM

## 2015-06-12 ENCOUNTER — Other Ambulatory Visit: Payer: Commercial Managed Care - HMO

## 2015-06-18 ENCOUNTER — Other Ambulatory Visit: Payer: Self-pay | Admitting: *Deleted

## 2015-06-18 MED ORDER — ATORVASTATIN CALCIUM 40 MG PO TABS: 40.0000 mg | ORAL_TABLET | Freq: Every day | ORAL | Status: DC

## 2015-06-18 NOTE — Telephone Encounter (Signed)
Pt wants Rx for 40mg  of atorvastatin instead of taking 1/2 tab of 80mg , done

## 2015-06-23 ENCOUNTER — Telehealth: Payer: Self-pay | Admitting: Family Medicine

## 2015-06-23 NOTE — Telephone Encounter (Signed)
[facility] called about pt - she was having chest pains and pt refused to call 911 or go to the hospital -  [place] will be sending note in as well  Thanks

## 2015-06-23 NOTE — Telephone Encounter (Signed)
Pt thinks muscular pain under lt breast; pt does not want to go to ED; pain level now is better;pt does not hurt as bad when standing. Pt fell 5 days ago and wonders if this pain is related to the fall. Pt scheduled appt on 06/24/15 at 1pm with R Baity NP; first available appt that was convenient for pts schedule. If pt condition changes or worsens prior to appt pt will go to ED.

## 2015-06-23 NOTE — Telephone Encounter (Signed)
Please check in with her and see how her symptoms are I will review the triage note when it comes in

## 2015-06-23 NOTE — Telephone Encounter (Signed)
Pt feeling okay, Triage note went to Webb Silversmith, NP because she is going to see pt tomorrow at 1pm

## 2015-06-23 NOTE — Telephone Encounter (Signed)
Patient Name: [name]  DOB: 02/25/1940    Initial Comment caller states she has chest pain   Nurse Assessment  Nurse: Raphael Gibney, RN, Vanita Ingles Date/Time (Eastern Time): 06/23/2015 9:31:51 AM  Confirm and document reason for call. If symptomatic, describe symptoms. You must click the next button to save text entered. ---Caller states she has pain under her left side. Pain increases when she lays down. She fell about 5-6 days ago. Pain is constant. Pain lessens when she stands up.  Has the patient traveled out of the country within the last 30 days? ---No  Does the patient have any new or worsening symptoms? ---Yes  Will a triage be completed? ---Yes  Related visit to physician within the last 2 weeks? ---No  Does the PT have any chronic conditions? (i.e. diabetes, asthma, etc.) ---Yes  List chronic conditions. ---CVA  Is this a behavioral health or substance abuse call? ---No     Guidelines    Guideline Title Affirmed Question Affirmed Notes  Chest Pain [1] Chest pain lasts > 5 minutes AND [2] age > 15    Final Disposition User   Call EMS [facility] [address] Raphael Gibney, RN, [place]    Comments  Pt states she does not want to call 911 or go to the ER but wants to come to MD office.  Called MD office and spoke to [facility] and gave report that pt is having chest pain and does not want to call 911 or go to the ER.  Pain is under her left breast   Referrals  GO TO FACILITY REFUSED   Disagree/Comply: Disagree  Disagree/Comply Reason: Unable to find transportation

## 2015-06-23 NOTE — Telephone Encounter (Signed)
Agree with advise given.

## 2015-06-24 ENCOUNTER — Ambulatory Visit: Payer: Commercial Managed Care - HMO | Admitting: Internal Medicine

## 2015-06-27 ENCOUNTER — Ambulatory Visit (INDEPENDENT_AMBULATORY_CARE_PROVIDER_SITE_OTHER)
Admission: RE | Admit: 2015-06-27 | Discharge: 2015-06-27 | Disposition: A | Discharge status: Home / Self Care | Payer: Commercial Managed Care - HMO | Source: Ambulatory Visit | Attending: Family Medicine | Admitting: Family Medicine

## 2015-06-27 ENCOUNTER — Ambulatory Visit (INDEPENDENT_AMBULATORY_CARE_PROVIDER_SITE_OTHER): Payer: Commercial Managed Care - HMO | Admitting: Family Medicine

## 2015-06-27 ENCOUNTER — Encounter: Payer: Self-pay | Admitting: Family Medicine

## 2015-06-27 VITALS — BP 120/64 | HR 59 | Temp 98.3°F | Ht 64.0 in | Wt 163.2 lb

## 2015-06-27 DIAGNOSIS — R21 Rash and other nonspecific skin eruption: Secondary | ICD-10-CM | POA: Insufficient documentation

## 2015-06-27 DIAGNOSIS — M810 Age-related osteoporosis without current pathological fracture: Secondary | ICD-10-CM | POA: Diagnosis not present

## 2015-06-27 DIAGNOSIS — M1612 Unilateral primary osteoarthritis, left hip: Secondary | ICD-10-CM | POA: Diagnosis not present

## 2015-06-27 DIAGNOSIS — M25552 Pain in left hip: Secondary | ICD-10-CM | POA: Diagnosis not present

## 2015-06-27 DIAGNOSIS — W19XXXA Unspecified fall, initial encounter: Secondary | ICD-10-CM | POA: Diagnosis not present

## 2015-06-27 DIAGNOSIS — M791 Myalgia: Secondary | ICD-10-CM

## 2015-06-27 DIAGNOSIS — M7918 Myalgia, other site: Secondary | ICD-10-CM | POA: Insufficient documentation

## 2015-06-27 MED ORDER — DICLOFENAC SODIUM 75 MG PO TBEC: 75.0000 mg | DELAYED_RELEASE_TABLET | Freq: Two times a day (BID) | ORAL | Status: DC

## 2015-06-27 MED ORDER — TRIAMCINOLONE ACETONIDE 0.1 % EX CREA: 1.0000 "application " | TOPICAL_CREAM | Freq: Two times a day (BID) | CUTANEOUS | Status: DC

## 2015-06-27 NOTE — Progress Notes (Signed)
Pre visit review using our clinic review tool, if applicable. No additional management support is needed unless otherwise documented below in the visit note. 

## 2015-06-27 NOTE — Patient Instructions (Addendum)
Change to hypo-allergenic soap and detergent.  Apply steroid cream to rash.  Call if not improving as expected.  Start home physical therapy to stretch left hip.  Start diclofenac 75 mg twice daily for pain and inflammation.  Call if not improving as expected.

## 2015-06-27 NOTE — Assessment & Plan Note (Signed)
Likely allergic dermatitis. Tera with steroid topically x 2 weeks.

## 2015-06-27 NOTE — Assessment & Plan Note (Signed)
Likely contusion and muscle strain, but with pt increase risk of fracture with age and osteoporosis will eval with X-ray. Treat with diclofenac and start home PT if X-ray negative.

## 2015-06-27 NOTE — Progress Notes (Signed)
Subjective:    Patient ID: [name], female    DOB: 07-Jun-1940, 75 y.o.   MRN: [id number]  HPI  75 year old female pt of Dr. Marliss Coots presents with 2 acute issues.   1. Rash on right breast and left lower stomach. She noted onset of rash 5-6 days ago, Has treated with antibiotic gel, tried anti itch spray. Noted first after  Very itchy.  Start as red bumps, no blister, scabbed up, scratching a lot. No new exposures but has to carry hay a lot. Uses Dove soap, has been using cheap detergents.  2. Left hip pain following a fall 6 days ago. She has history of CVA. 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  Fell on left buttock and lateral hip.  Has bruise in area of fall. Mild soreness. No pain with standing, some pain with sitting. She has tried ibuprofen 800 mg which helped some, but not much.  Hx of osteoporosis  Social History /Family History/Past Medical History reviewed and updated if needed.   Review of Systems  Constitutional: Negative for fever and fatigue.  HENT: Negative for ear pain.   Eyes: Negative for pain.  Respiratory: Negative for chest tightness and shortness of breath.   Cardiovascular: Negative for chest pain, palpitations and leg swelling.  Gastrointestinal: Negative for abdominal pain.  Genitourinary: Negative for dysuria.       Objective:   Physical Exam  Constitutional: Vital signs are normal. She appears well-developed and well-nourished. She is cooperative.  Non-toxic appearance. She does not appear ill. No distress.  HENT:  Head: Normocephalic.  Right Ear: Hearing, tympanic membrane, external ear and ear canal normal. Tympanic membrane is not erythematous, not retracted and not bulging.  Left Ear: Hearing, tympanic membrane, external ear and ear canal normal. Tympanic membrane is not erythematous, not retracted and not bulging.  Nose: No mucosal edema or rhinorrhea. Right sinus exhibits no  maxillary sinus tenderness and no frontal sinus tenderness. Left sinus exhibits no maxillary sinus tenderness and no frontal sinus tenderness.  Mouth/Throat: Uvula is midline, oropharynx is clear and moist and mucous membranes are normal.  Eyes: Conjunctivae, EOM and lids are normal. Pupils are equal, round, and reactive to light. Lids are everted and swept, no foreign bodies found.  Neck: Trachea normal and normal range of motion. Neck supple. Carotid bruit is not present. No thyroid mass and no thyromegaly present.  Cardiovascular: Normal rate, regular rhythm, S1 normal, S2 normal, normal heart sounds, intact distal pulses and normal pulses.  Exam reveals no gallop and no friction rub.   No murmur heard. Pulmonary/Chest: Effort normal and breath sounds normal. No tachypnea. No respiratory distress. She has no decreased breath sounds. She has no wheezes. She has no rhonchi. She has no rales.  Abdominal: Soft. Normal appearance and bowel sounds are normal. There is no tenderness.  Musculoskeletal:       Left hip: She exhibits decreased range of motion, tenderness and bony tenderness. She exhibits normal strength.   ttp in sciatic npotch and over lateral hip, no anterior pelvic pain Mildly positive faber's  Neurological: She is alert.  Skin: Skin is warm, dry and intact. Rash noted.  Contusion over left lateral hip Scabbed excoriations on right upper breast and left abdomen.  Psychiatric: Her speech is normal and behavior is normal. Judgment and thought content normal. Her mood appears not anxious. Cognition and memory are normal. She does not exhibit a depressed  mood.          Assessment & Plan:

## 2015-07-15 ENCOUNTER — Other Ambulatory Visit: Payer: Commercial Managed Care - HMO

## 2015-07-16 ENCOUNTER — Other Ambulatory Visit (INDEPENDENT_AMBULATORY_CARE_PROVIDER_SITE_OTHER): Payer: Commercial Managed Care - HMO

## 2015-07-16 ENCOUNTER — Telehealth: Payer: Self-pay | Admitting: Radiology

## 2015-07-16 ENCOUNTER — Other Ambulatory Visit: Payer: Self-pay | Admitting: Family Medicine

## 2015-07-16 DIAGNOSIS — Z1159 Encounter for screening for other viral diseases: Secondary | ICD-10-CM

## 2015-07-16 DIAGNOSIS — E785 Hyperlipidemia, unspecified: Secondary | ICD-10-CM

## 2015-07-16 DIAGNOSIS — Z7289 Other problems related to lifestyle: Secondary | ICD-10-CM

## 2015-07-16 LAB — LIPID PANEL
Cholesterol: 168 mg/dL (ref 0–200)
HDL: 62.2 mg/dL (ref >39.00)
LDL Cholesterol: 88 mg/dL (ref 0–99)
NonHDL: 106.28
Total CHOL/HDL Ratio: 3
Triglycerides: 93 mg/dL (ref 0.0–149.0)
VLDL: 18.6 mg/dL (ref 0.0–40.0)

## 2015-07-16 LAB — AST: AST: 29 U/L (ref 0–37)

## 2015-07-16 LAB — ALT: ALT: 45 U/L — ABNORMAL HIGH (ref 0–35)

## 2015-07-16 NOTE — Telephone Encounter (Signed)
Opened in error

## 2015-07-17 ENCOUNTER — Encounter: Payer: Self-pay | Admitting: *Deleted

## 2015-07-17 LAB — HEPATITIS C ANTIBODY: HCV Ab: NEGATIVE

## 2015-07-30 ENCOUNTER — Encounter: Payer: Self-pay | Admitting: *Deleted

## 2015-07-31 DIAGNOSIS — H401133 Primary open-angle glaucoma, bilateral, severe stage: Secondary | ICD-10-CM | POA: Diagnosis not present

## 2015-07-31 DIAGNOSIS — Z961 Presence of intraocular lens: Secondary | ICD-10-CM | POA: Diagnosis not present

## 2015-07-31 DIAGNOSIS — H43393 Other vitreous opacities, bilateral: Secondary | ICD-10-CM | POA: Diagnosis not present

## 2015-07-31 DIAGNOSIS — H524 Presbyopia: Secondary | ICD-10-CM | POA: Diagnosis not present

## 2015-07-31 DIAGNOSIS — H1859 Other hereditary corneal dystrophies: Secondary | ICD-10-CM | POA: Diagnosis not present

## 2015-08-05 ENCOUNTER — Other Ambulatory Visit: Payer: Self-pay | Admitting: Family Medicine

## 2015-08-18 ENCOUNTER — Other Ambulatory Visit: Payer: Self-pay | Admitting: Family Medicine

## 2015-10-16 ENCOUNTER — Other Ambulatory Visit: Payer: Self-pay | Admitting: Family Medicine

## 2015-10-16 DIAGNOSIS — E785 Hyperlipidemia, unspecified: Secondary | ICD-10-CM

## 2015-10-23 ENCOUNTER — Other Ambulatory Visit (INDEPENDENT_AMBULATORY_CARE_PROVIDER_SITE_OTHER): Payer: Commercial Managed Care - HMO

## 2015-10-23 DIAGNOSIS — E785 Hyperlipidemia, unspecified: Secondary | ICD-10-CM

## 2015-10-23 LAB — LIPID PANEL
Cholesterol: 141 mg/dL (ref 0–200)
HDL: 58.4 mg/dL (ref >39.00)
LDL Cholesterol: 72 mg/dL (ref 0–99)
NonHDL: 82.74
Total CHOL/HDL Ratio: 2
Triglycerides: 56 mg/dL (ref 0.0–149.0)
VLDL: 11.2 mg/dL (ref 0.0–40.0)

## 2015-10-23 LAB — AST: AST: 29 U/L (ref 0–37)

## 2015-10-23 LAB — ALT: ALT: 32 U/L (ref 0–35)

## 2015-10-24 ENCOUNTER — Encounter: Payer: Self-pay | Admitting: *Deleted

## 2015-11-07 ENCOUNTER — Other Ambulatory Visit: Payer: Self-pay | Admitting: *Deleted

## 2015-11-07 MED ORDER — CLOPIDOGREL BISULFATE 75 MG PO TABS: 75.0000 mg | ORAL_TABLET | Freq: Every day | ORAL | 1 refills | Status: DC

## 2015-11-07 MED ORDER — ATORVASTATIN CALCIUM 40 MG PO TABS: 40.0000 mg | ORAL_TABLET | Freq: Every day | ORAL | 1 refills | Status: DC

## 2015-11-07 NOTE — Telephone Encounter (Signed)
Received fax saying pt needs 90 day Rxs, done

## 2015-11-19 ENCOUNTER — Encounter: Payer: Self-pay | Admitting: Family Medicine

## 2015-11-19 ENCOUNTER — Ambulatory Visit (INDEPENDENT_AMBULATORY_CARE_PROVIDER_SITE_OTHER): Payer: Commercial Managed Care - HMO | Admitting: Family Medicine

## 2015-11-19 VITALS — BP 120/70 | HR 80 | Wt 162.0 lb

## 2015-11-19 DIAGNOSIS — L989 Disorder of the skin and subcutaneous tissue, unspecified: Secondary | ICD-10-CM | POA: Diagnosis not present

## 2015-11-19 DIAGNOSIS — B009 Herpesviral infection, unspecified: Secondary | ICD-10-CM | POA: Diagnosis not present

## 2015-11-19 MED ORDER — VALACYCLOVIR HCL 500 MG PO TABS: 500.0000 mg | ORAL_TABLET | Freq: Two times a day (BID) | ORAL | 2 refills | Status: DC

## 2015-11-19 NOTE — Patient Instructions (Signed)
Once skin is intact following liquid nitrogen treatment, you can use over the counter wart tape, duct tape or another over the counter preparation Warts Warts are small growths on the skin. They are common and can occur on various areas of the body. A person may have one wart or multiple warts. Most warts are not painful, and they usually do not cause problems. However, warts can cause pain if they are large or occur in an area of the body where pressure will be applied to them, such as the bottom of the foot. In many cases, warts do not require treatment. They usually go away on their own over a period of many months to a couple years. Various treatments may be done for warts that cause problems or do not go away. Sometimes, warts go away and then come back again. CAUSES Warts are caused by a type of virus that is called human papillomavirus (HPV). This virus can spread from person to person through direct contact. Warts can also spread to other areas of the body when a person scratches a wart and then scratches another area of his or her body.  RISK FACTORS Warts are more likely to develop in:  People who are 57-40 years of age.  People who have a weakened body defense system (immune system). SYMPTOMS A wart may be round or oval or have an irregular shape. Most warts have a rough surface. Warts may range in color from skin color to light yellow, brown, or gray. They are generally less than  inch (1.3 cm) in size. Most warts are painless, but some can be painful when pressure is applied to them. DIAGNOSIS A wart can usually be diagnosed from its appearance. In some cases, a tissue sample may be removed (biopsy) to be looked at under a microscope. TREATMENT In many cases, warts do not need treatment. If treatment is needed, options may include:  Applying medicated solutions, creams, or patches to the wart. These may be over-the-counter or prescription medicines that make the skin soft so that  layers will gradually shed away. In many cases, the medicine is applied one or two times per day and covered with a bandage.  Putting duct tape over the top of the wart (occlusion). You will leave the tape in place for as long as told by your health care provider, then you will replace it with a new strip of tape. This is done until the wart goes away.  Freezing the wart with liquid nitrogen (cryotherapy).  Burning the wart with:  Laser treatment.  An electrified probe (electrocautery).  Injection of a medicine (Candida antigen) into the wart to help the body's immune system to fight off the wart.  Surgery to remove the wart. HOME CARE INSTRUCTIONS  Apply over-the-counter and prescription medicines only as told by your health care provider.  Do not apply over-the-counter wart medicines to your face or genitals before you ask your health care provider if it is okay to do so.  Do not scratch or pick at a wart.  Wash your hands after you touch a wart.  Avoid shaving hair that is over a wart.  Keep all follow-up visits as told by your health care provider. This is important. SEEK MEDICAL CARE IF:  Your warts do not improve after treatment.  You have redness, swelling, or pain at the site of a wart.  You have bleeding from a wart that does not stop with light pressure.  You have diabetes and  you develop a wart.   This information is not intended to replace advice given to you by your health care provider. Make sure you discuss any questions you have with your health care provider.   Document Released: 11/18/2004 Document Revised: 10/30/2014 Document Reviewed: 05/06/2014 Elsevier Interactive Patient Education [employer].

## 2015-11-19 NOTE — Progress Notes (Signed)
Subjective:    Patient ID: [name], female    DOB: 04-May-1940, 75 y.o.   MRN: [id number]  HPI This is a 75 yo female who presents today with left thumb lesion. Thinks it is a wart. Has another on her third finger, distal lateral side. Areas getting larger, painful, especially thumb lesion.  Has a history of genital herpes. Has a lesion that appears periodically on her sacrum. Has noticed a rash for 3 days. Has been using some Zostavax ointment that is old. Usually has 1-2 breakouts per year when she is under stress. Occasionally has some tingling at site prior to rash appearing. No fever/chills/myalgias. Not recently sexually active.    Past Medical History:  Diagnosis Date  . Coronary artery disease, non-occlusive   . GERD (gastroesophageal reflux disease)   . Heel spur   . HSV (herpes simplex virus) infection   . Hyperglycemia   . Hyperlipidemia   . IBS (irritable bowel syndrome)   . IC (interstitial cystitis)   . Insomnia disorder related to known organic factor   . Osteoporosis   . Vitamin D deficiency    Past Surgical History:  Procedure Laterality Date  . ABDOMINAL HYSTERECTOMY    . CARDIAC CATHETERIZATION  7/12   non obst dz  . CHOLECYSTECTOMY    . ROTATOR CUFF REPAIR     2 TIMES   Family History  Problem Relation Age of Onset  . Alcohol abuse Mother   . Heart attack Mother   . GER disease Mother   . GER disease Father   . Alcohol abuse Father   . Stroke Father   . Tuberculosis Father   . Heart attack Father   . Aneurysm Father   . Cancer Sister     FEMALE  . Mental illness Sister   . Depression Sister    Social History  Substance Use Topics  . Smoking status: Never Smoker  . Smokeless tobacco: Never Used  . Alcohol use 0.0 oz/week     Comment: rare      Review of Systems Per HPI    Objective:   Physical Exam  Constitutional: She is oriented to person, place, and time. She appears well-developed and well-nourished. No distress.    HENT:  Head: Normocephalic and atraumatic.  Cardiovascular: Normal rate.   Pulmonary/Chest: Effort normal.  Neurological: She is alert and oriented to person, place, and time.  Skin: Skin is warm and dry. Lesion and rash (2 cm area vessicles on right side of lower sacrum. ) noted. She is not diaphoretic.  Left mid thumb with common wart (raised, darkened, thickened area). Left third finger, lateral side with smaller common wart.   Psychiatric: She has a normal mood and affect. Her behavior is normal. Judgment and thought content normal.  Vitals reviewed.     BP 120/70   Pulse 80   Wt 162 lb (73.5 kg)   SpO2 97%   BMI 27.81 kg/m  Wt Readings from Last 3 Encounters:  11/19/15 162 lb (73.5 kg)  06/27/15 163 lb 4 oz (74 kg)  05/12/15 165 lb (74.8 kg)   Verbal consent obtained.  Cleansed skin with alcohol prep pad.  Cryotherapy applied (freeze-thaw-freeze x 2) to two affected area on left hand.  The patient tolerated the procedure well.     Assessment & Plan:  1. Skin lesion - Provided written and verbal information regarding diagnosis and treatment. - RTC precautions reviewed  2. HSV infection - valACYclovir (VALTREX) 500  MG tablet; Take 1 tablet (500 mg total) by mouth 2 (two) times daily.  Dispense: 6 tablet; Refill: 2   Clarene Reamer, FNP-BC  [facility]  11/19/2015 4:14 PM

## 2016-02-05 DIAGNOSIS — H1859 Other hereditary corneal dystrophies: Secondary | ICD-10-CM | POA: Diagnosis not present

## 2016-02-05 DIAGNOSIS — H524 Presbyopia: Secondary | ICD-10-CM | POA: Diagnosis not present

## 2016-02-05 DIAGNOSIS — H401133 Primary open-angle glaucoma, bilateral, severe stage: Secondary | ICD-10-CM | POA: Diagnosis not present

## 2016-02-05 DIAGNOSIS — H43393 Other vitreous opacities, bilateral: Secondary | ICD-10-CM | POA: Diagnosis not present

## 2016-02-05 DIAGNOSIS — Z961 Presence of intraocular lens: Secondary | ICD-10-CM | POA: Diagnosis not present

## 2016-04-01 ENCOUNTER — Other Ambulatory Visit: Payer: Self-pay | Admitting: Family Medicine

## 2016-04-13 ENCOUNTER — Encounter: Payer: Self-pay | Admitting: Gastroenterology

## 2016-04-29 DIAGNOSIS — R69 Illness, unspecified: Secondary | ICD-10-CM | POA: Diagnosis not present

## 2016-05-17 ENCOUNTER — Encounter: Payer: Self-pay | Admitting: Gastroenterology

## 2016-05-17 ENCOUNTER — Ambulatory Visit: Payer: Commercial Managed Care - HMO | Admitting: Gastroenterology

## 2016-07-13 ENCOUNTER — Encounter (INDEPENDENT_AMBULATORY_CARE_PROVIDER_SITE_OTHER): Payer: Self-pay

## 2016-07-13 ENCOUNTER — Telehealth: Payer: Self-pay | Admitting: *Deleted

## 2016-07-13 ENCOUNTER — Encounter: Payer: Self-pay | Admitting: Gastroenterology

## 2016-07-13 ENCOUNTER — Ambulatory Visit (INDEPENDENT_AMBULATORY_CARE_PROVIDER_SITE_OTHER): Payer: Medicare HMO | Admitting: Gastroenterology

## 2016-07-13 VITALS — BP 120/74 | HR 74 | Ht 64.0 in | Wt 152.4 lb

## 2016-07-13 DIAGNOSIS — K625 Hemorrhage of anus and rectum: Secondary | ICD-10-CM | POA: Diagnosis not present

## 2016-07-13 DIAGNOSIS — K5900 Constipation, unspecified: Secondary | ICD-10-CM | POA: Diagnosis not present

## 2016-07-13 MED ORDER — NA SULFATE-K SULFATE-MG SULF 17.5-3.13-1.6 GM/177ML PO SOLN: ORAL | 0 refills | Status: DC

## 2016-07-13 NOTE — Telephone Encounter (Signed)
She had a small stroke over a year ago with no re occurrence .  Ok to hold plavix briefly as planned for procedure

## 2016-07-13 NOTE — Telephone Encounter (Signed)
   Solange Emry 01-16-1941 [id number]  Dear Dr Glori Bickers:  We have scheduled the above named patient for a(n) colonoscopy procedure. Our records show that (s)he is on anticoagulation therapy.  Please advise as to whether the patient may come off their therapy of Plavix 5 days prior to their procedure which is scheduled for 08/31/16.  Please route your response to Dixon Boos, CMA.  Sincerely, [facility]

## 2016-07-13 NOTE — Patient Instructions (Addendum)
You have been scheduled for a colonoscopy. Please follow written instructions given to you at your visit today.  Please pick up your prep supplies at the pharmacy within the next 1-3 days. If you use inhalers (even only as needed), please bring them with you on the day of your procedure. Your physician has requested that you go to www.startemmi.com and enter the access code given to you at your visit today. This web site gives a general overview about your procedure. However, you should still follow specific instructions given to you by our office regarding your preparation for the procedure.  You will be contacted by our office prior to your procedure for directions on holding your Plavix.  If you do not hear from our office 1 week prior to your scheduled procedure, please call [phone] to discuss.   Please start taking citrucel (orange flavored) powder fiber supplement.  This may cause some bloating at first but that usually goes away. Begin with a small spoonful and work your way up to a large, heaping spoonful daily over a week.  Your body mass index should be between 23-30. Your Body mass index is 26.16 kg/m. If this is out of the aforementioned range listed, please consider follow up with your Primary Care Provider.

## 2016-07-13 NOTE — Progress Notes (Signed)
HPI: This is a  pleasant 76 year old woman   who was referred to me by Abner Greenspan, MD  to evaluate  repeat colonoscopy for colon cancer screening .    Chief complaint is intermittent rectal bleeding, "need a colonoscopy"  Old Data Reviewed:  She had a colonoscopy with [name] August 2011. This was done for "personal history of polyps". He described finding hemorrhoids. The examination was otherwise normal. He recommended repeat colonoscopy at 5 years for history of polyps.  I cannot find any previous colonoscopy reports or associated pathology reports prior to that 2011 colonoscopy  She is on plavix for small stroke in late 2017.  She has hemorrhoids and they intermittently cause bleeding.  About 1-2 years ago she had severe actute bleeding that resolved on it's own.  Can have difficulty with constipation at times.  She has tried suppositories, Dulcolax. She I believe this tried some fiber supplements but never really gave it more than just a day or 2 to work.  No FH of colon cancer.       Review of systems: Pertinent positive and negative review of systems were noted in the above HPI section. All other review negative.   Past Medical History:  Diagnosis Date  . Coronary artery disease, non-occlusive   . GERD (gastroesophageal reflux disease)   . Heel spur   . HSV (herpes simplex virus) infection   . Hyperglycemia   . Hyperlipidemia   . IBS (irritable bowel syndrome)   . IC (interstitial cystitis)   . Insomnia disorder related to known organic factor   . Osteoporosis   . Vitamin D deficiency     Past Surgical History:  Procedure Laterality Date  . ABDOMINAL HYSTERECTOMY    . CARDIAC CATHETERIZATION  7/12   non obst dz  . CHOLECYSTECTOMY    . ROTATOR CUFF REPAIR     2 TIMES    Current Outpatient Prescriptions  Medication Sig Dispense Refill  . aspirin 81 MG tablet Take 81 mg by mouth daily.    Marland Kitchen atorvastatin (LIPITOR) 40 MG tablet TAKE 1 TABLET  (40 MG TOTAL) BY MOUTH DAILY AT 6 PM. 90 tablet 0  . Biotin 5000 MCG CAPS Take 5,000 mcg by mouth daily.    . cholecalciferol (VITAMIN D) 1000 units tablet Take 1,000 Units by mouth daily.    . clopidogrel (PLAVIX) 75 MG tablet TAKE 1 TABLET (75 MG TOTAL) BY MOUTH DAILY. 90 tablet 0  . COMBIGAN 0.2-0.5 % ophthalmic solution Place 1 drop into both eyes 2 (two) times daily.  2  . Glucosamine HCl (GLUCOSAMINE PO) Take 1 tablet by mouth daily.    Marland Kitchen LUMIGAN 0.01 % SOLN Place 1 drop into both eyes at bedtime.    . Multiple Vitamin (MULTIVITAMIN WITH MINERALS) TABS tablet Take 1 tablet by mouth daily.    . valACYclovir (VALTREX) 500 MG tablet Take 1 tablet (500 mg total) by mouth 2 (two) times daily. 6 tablet 2   No current facility-administered medications for this visit.     Allergies as of 07/13/2016 - Review Complete 07/13/2016  Allergen Reaction Noted  . Ampicillin Other (See Comments) 05/06/2015  . Codeine Nausea And Vomiting 03/17/2007  . Meperidine hcl Nausea And Vomiting 03/17/2007  . Penicillins Other (See Comments) 03/17/2007  . Propoxyphene hcl Nausea And Vomiting 03/17/2007  . Sulfa antibiotics Hives 05/06/2015  . Tramadol hcl Nausea And Vomiting     Family History  Problem Relation Age of Onset  .  Alcohol abuse Mother   . Heart attack Mother   . GER disease Mother   . GER disease Father   . Alcohol abuse Father   . Stroke Father   . Tuberculosis Father   . Heart attack Father   . Aneurysm Father   . Cancer Sister        FEMALE  . Mental illness Sister   . Depression Sister     Social History   Social History  . Marital status: Married    Spouse name: N/A  . Number of children: 4  . Years of education: N/A   Occupational History  . reitred Retired   Social History Main Topics  . Smoking status: Never Smoker  . Smokeless tobacco: Never Used  . Alcohol use 0.0 oz/week     Comment: rare  . Drug use: No  . Sexual activity: Not on file   Other Topics  Concern  . Not on file   Social History Narrative   Rides horses         Specialty providers    [facility]   Cardio- [facility]   Ortho--[place]   Rheum-[facility]   urol-[facility]   [facility]-- [facility]     Physical Exam: BP 120/74 (BP Location: Left Arm, Patient Position: Sitting, Cuff Size: Normal)   Pulse 74   Ht 5\' 4"  (1.626 m)   Wt 152 lb 6.4 oz (69.1 kg)   SpO2 98%   BMI 26.16 kg/m  Constitutional: generally well-appearing Psychiatric: alert and oriented x3 Eyes: extraocular movements intact Mouth: oral pharynx moist, no lesions Neck: supple no lymphadenopathy Cardiovascular: heart regular rate and rhythm Lungs: clear to auscultation bilaterally Abdomen: soft, nontender, nondistended, no obvious ascites, no peritoneal signs, normal bowel sounds Extremities: no lower extremity edema bilaterally Skin: no lesions on visible extremities   Assessment and plan: 76 y.o. female with  intermittent rectal bleeding, constipation, personal history of polyps  First she thought her last colonoscopy was about 10 years ago when actually it was about 7. At that time Dr. Oletta Lamas found only hemorrhoids. I do believe she is having hemorrhoidal difficulty at times. She also has trouble with constipation I recommended she get on fiber supplements and stay on it for at least 2 or 3 weeks before judging whether it is helping. I recommended repeat colonoscopy given her, constipation and intermittent rectal bleeding.  She takes Plavix daily for a recent small stroke. It doesn't appear that she has any significant deficits and we will communicate with her primary care physician about the safety of holding that medicine for 5 days prior to a colonoscopy.   Please see the "Patient Instructions" section for addition details about the plan.   Owens Loffler, MD [facility] 07/13/2016, 1:24 PM  Cc: Tower, [name], MD

## 2016-07-14 ENCOUNTER — Telehealth: Payer: Self-pay

## 2016-07-14 NOTE — Telephone Encounter (Signed)
Pt wants to know when last pt had shingles vaccine. Advised had zostavax on 06/11/2010. Pt said that was all that was needed.

## 2016-07-14 NOTE — Telephone Encounter (Signed)
Left voicemail for patient to call back. 

## 2016-07-15 NOTE — Telephone Encounter (Signed)
I have spoken to patient to advise that per Dr Glori Bickers, she may hold Plavix 5 days prior to her colonoscopy procedure. Patient verbalizes understanding.

## 2016-07-27 ENCOUNTER — Telehealth: Payer: Self-pay | Admitting: Family Medicine

## 2016-07-27 ENCOUNTER — Other Ambulatory Visit: Payer: Commercial Managed Care - HMO

## 2016-07-27 ENCOUNTER — Ambulatory Visit: Payer: Commercial Managed Care - HMO

## 2016-07-27 DIAGNOSIS — R7309 Other abnormal glucose: Secondary | ICD-10-CM

## 2016-07-27 DIAGNOSIS — E559 Vitamin D deficiency, unspecified: Secondary | ICD-10-CM

## 2016-07-27 DIAGNOSIS — Z0000 Encounter for general adult medical examination without abnormal findings: Secondary | ICD-10-CM

## 2016-07-27 NOTE — Telephone Encounter (Signed)
-----   Message from Eustace Pen, LPN sent at 10/25/8099 11:04 AM EDT ----- Regarding: Labs 07/27/16 Please place lab orders.   [employer]

## 2016-07-28 ENCOUNTER — Other Ambulatory Visit: Payer: Medicare HMO

## 2016-07-28 ENCOUNTER — Ambulatory Visit (INDEPENDENT_AMBULATORY_CARE_PROVIDER_SITE_OTHER): Payer: Medicare HMO

## 2016-07-28 VITALS — BP 118/68 | HR 67 | Temp 97.7°F | Ht 63.5 in | Wt 153.0 lb

## 2016-07-28 DIAGNOSIS — R7309 Other abnormal glucose: Secondary | ICD-10-CM | POA: Diagnosis not present

## 2016-07-28 DIAGNOSIS — Z0000 Encounter for general adult medical examination without abnormal findings: Secondary | ICD-10-CM | POA: Diagnosis not present

## 2016-07-28 DIAGNOSIS — Z23 Encounter for immunization: Secondary | ICD-10-CM | POA: Diagnosis not present

## 2016-07-28 DIAGNOSIS — E559 Vitamin D deficiency, unspecified: Secondary | ICD-10-CM

## 2016-07-28 LAB — CBC WITH DIFFERENTIAL/PLATELET
Basophils Absolute: 0.1 10*3/uL (ref 0.0–0.1)
Basophils Relative: 0.8 % (ref 0.0–3.0)
Eosinophils Absolute: 0.1 10*3/uL (ref 0.0–0.7)
Eosinophils Relative: 2 % (ref 0.0–5.0)
HCT: 35.5 % — ABNORMAL LOW (ref 36.0–46.0)
Hemoglobin: 11.8 g/dL — ABNORMAL LOW (ref 12.0–15.0)
Lymphocytes Relative: 21.8 % (ref 12.0–46.0)
Lymphs Abs: 1.5 10*3/uL (ref 0.7–4.0)
MCHC: 33.2 g/dL (ref 30.0–36.0)
MCV: 87.4 fl (ref 78.0–100.0)
Monocytes Absolute: 0.7 10*3/uL (ref 0.1–1.0)
Monocytes Relative: 10.5 % (ref 3.0–12.0)
Neutro Abs: 4.4 10*3/uL (ref 1.4–7.7)
Neutrophils Relative %: 64.9 % (ref 43.0–77.0)
Platelets: 293 10*3/uL (ref 150.0–400.0)
RBC: 4.06 Mil/uL (ref 3.87–5.11)
RDW: 14.2 % (ref 11.5–15.5)
WBC: 6.7 10*3/uL (ref 4.0–10.5)

## 2016-07-28 LAB — TSH: TSH: 2.2 u[IU]/mL (ref 0.35–4.50)

## 2016-07-28 LAB — LIPID PANEL
Cholesterol: 142 mg/dL (ref 0–200)
HDL: 58.5 mg/dL (ref >39.00)
LDL Cholesterol: 69 mg/dL (ref 0–99)
NonHDL: 83.36
Total CHOL/HDL Ratio: 2
Triglycerides: 74 mg/dL (ref 0.0–149.0)
VLDL: 14.8 mg/dL (ref 0.0–40.0)

## 2016-07-28 LAB — VITAMIN D 25 HYDROXY (VIT D DEFICIENCY, FRACTURES): VITD: 28.21 ng/mL — ABNORMAL LOW (ref 30.00–100.00)

## 2016-07-28 LAB — COMPREHENSIVE METABOLIC PANEL
ALT: 19 U/L (ref 0–35)
AST: 18 U/L (ref 0–37)
Albumin: 3.9 g/dL (ref 3.5–5.2)
Alkaline Phosphatase: 74 U/L (ref 39–117)
BUN: 19 mg/dL (ref 6–23)
CO2: 28 mEq/L (ref 19–32)
Calcium: 9.4 mg/dL (ref 8.4–10.5)
Chloride: 105 mEq/L (ref 96–112)
Creatinine, Ser: 0.88 mg/dL (ref 0.40–1.20)
GFR: 66.05 mL/min (ref >60.00)
Glucose, Bld: 96 mg/dL (ref 70–99)
Potassium: 4.2 mEq/L (ref 3.5–5.1)
Sodium: 139 mEq/L (ref 135–145)
Total Bilirubin: 0.6 mg/dL (ref 0.2–1.2)
Total Protein: 6.7 g/dL (ref 6.0–8.3)

## 2016-07-28 LAB — HEMOGLOBIN A1C: Hgb A1c MFr Bld: 6.3 % (ref 4.6–6.5)

## 2016-07-28 NOTE — Patient Instructions (Signed)
[name] , Thank you for taking time to come for your Medicare Wellness Visit. I appreciate your ongoing commitment to your health goals. Please review the following plan we discussed and let me know if I can assist you in the future.   These are the goals we discussed: Goals    . Increase water intake          Starting 07/28/16, I will continue to drink at least 6-8 glasses of water daily.        This is a list of the screening recommended for you and due dates:  Health Maintenance  Topic Date Due  . Flu Shot  09/22/2016  . Tetanus Vaccine  04/17/2024  . DEXA scan (bone density measurement)  Completed  . Pneumonia vaccines  Completed   Preventive Care for Adults  A healthy lifestyle and preventive care can promote health and wellness. Preventive health guidelines for adults include the following key practices.  . A routine yearly physical is a good way to check with your health care provider about your health and preventive screening. It is a chance to share any concerns and updates on your health and to receive a thorough exam.  . Visit your dentist for a routine exam and preventive care every 6 months. Brush your teeth twice a day and floss once a day. Good oral hygiene prevents tooth decay and gum disease.  . The frequency of eye exams is based on your age, health, family medical history, use  of contact lenses, and other factors. Follow your health care provider's ecommendations for frequency of eye exams.  . Eat a healthy diet. Foods like vegetables, fruits, whole grains, low-fat dairy products, and lean protein foods contain the nutrients you need without too many calories. Decrease your intake of foods high in solid fats, added sugars, and salt. Eat the right amount of calories for you. Get information about a proper diet from your health care provider, if necessary.  . Regular physical exercise is one of the most important things you can do for your health. Most adults  should get at least 150 minutes of moderate-intensity exercise (any activity that increases your heart rate and causes you to sweat) each week. In addition, most adults need muscle-strengthening exercises on 2 or more days a week.  Silver Sneakers may be a benefit available to you. To determine eligibility, you may visit the website: www.silversneakers.com or contact program at (406) 650-9679 Mon-Fri between 8AM-8PM.   . Maintain a healthy weight. The body mass index (BMI) is a screening tool to identify possible weight problems. It provides an estimate of body fat based on height and weight. Your health care provider can find your BMI and can help you achieve or maintain a healthy weight.   For adults 20 years and older: ? A BMI below 18.5 is considered underweight. ? A BMI of 18.5 to 24.9 is normal. ? A BMI of 25 to 29.9 is considered overweight. ? A BMI of 30 and above is considered obese.   . Maintain normal blood lipids and cholesterol levels by exercising and minimizing your intake of saturated fat. Eat a balanced diet with plenty of fruit and vegetables. Blood tests for lipids and cholesterol should begin at age 75 and be repeated every 5 years. If your lipid or cholesterol levels are high, you are over 50, or you are at high risk for heart disease, you may need your cholesterol levels checked more frequently. Ongoing high lipid and cholesterol  levels should be treated with medicines if diet and exercise are not working.  . If you smoke, find out from your health care provider how to quit. If you do not use tobacco, please do not start.  . If you choose to drink alcohol, please do not consume more than 2 drinks per day. One drink is considered to be 12 ounces (355 mL) of beer, 5 ounces (148 mL) of wine, or 1.5 ounces (44 mL) of liquor.  . If you are 13-22 years old, ask your health care provider if you should take aspirin to prevent strokes.  . Use sunscreen. Apply sunscreen liberally and  repeatedly throughout the day. You should seek shade when your shadow is shorter than you. Protect yourself by wearing long sleeves, pants, a wide-brimmed hat, and sunglasses year round, whenever you are outdoors.  . Once a month, do a whole body skin exam, using a mirror to look at the skin on your back. Tell your health care provider of new moles, moles that have irregular borders, moles that are larger than a pencil eraser, or moles that have changed in shape or color.

## 2016-07-28 NOTE — Progress Notes (Signed)
PCP notes:   Health maintenance:  PCV13 - administered  Abnormal screenings:   Hearing - failed Fall risk - hx of fall with injury and without medical treatment  Patient concerns:   None  Nurse concerns:  None  Next PCP appt:   07/30/16 @ 1015  I reviewed health advisor's note, was available for consultation, and agree with documentation and plan. Loura Pardon MD

## 2016-07-28 NOTE — Progress Notes (Signed)
Pre visit review using our clinic review tool, if applicable. No additional management support is needed unless otherwise documented below in the visit note. 

## 2016-07-28 NOTE — Progress Notes (Signed)
Subjective:   [name] is a 76 y.o. female who presents for an Initial Medicare Annual Wellness Visit.  Review of Systems    N/A  Cardiac Risk Factors include: advanced age (>56men, >76 women);dyslipidemia;hypertension     Objective:    Today's Vitals   07/28/16 1214  BP: 118/68  Pulse: 67  Temp: 97.7 F (36.5 C)  TempSrc: Oral  SpO2: 96%  Weight: 153 lb (69.4 kg)  Height: 5' 3.5" (1.613 m)  PainSc: 0-No pain   Body mass index is 26.68 kg/m.   Current Medications (verified) Outpatient Encounter Prescriptions as of 07/28/2016  Medication Sig  . aspirin 81 MG tablet Take 81 mg by mouth daily.  Marland Kitchen atorvastatin (LIPITOR) 40 MG tablet TAKE 1 TABLET (40 MG TOTAL) BY MOUTH DAILY AT 6 PM.  . Biotin 5000 MCG CAPS Take 5,000 mcg by mouth daily.  . cholecalciferol (VITAMIN D) 1000 units tablet Take 1,000 Units by mouth daily.  . clopidogrel (PLAVIX) 75 MG tablet TAKE 1 TABLET (75 MG TOTAL) BY MOUTH DAILY.  Marland Kitchen COMBIGAN 0.2-0.5 % ophthalmic solution Place 1 drop into both eyes 2 (two) times daily.  . Glucosamine HCl (GLUCOSAMINE PO) Take 1 tablet by mouth daily.  Marland Kitchen LUMIGAN 0.01 % SOLN Place 1 drop into both eyes at bedtime.  . Multiple Vitamin (MULTIVITAMIN WITH MINERALS) TABS tablet Take 1 tablet by mouth daily.  . Na Sulfate-K Sulfate-Mg Sulf 17.5-3.13-1.6 GM/180ML SOLN suprep-use as directed  . valACYclovir (VALTREX) 500 MG tablet Take 1 tablet (500 mg total) by mouth 2 (two) times daily. (Patient taking differently: Take 500 mg by mouth as needed. )   No facility-administered encounter medications on file as of 07/28/2016.     Allergies (verified) Ampicillin; Codeine; Meperidine hcl; Penicillins; Propoxyphene hcl; Sulfa antibiotics; and Tramadol hcl   History: Past Medical History:  Diagnosis Date  . Coronary artery disease, non-occlusive   . GERD (gastroesophageal reflux disease)   . Heel spur   . HSV (herpes simplex virus) infection   . Hyperglycemia   .  Hyperlipidemia   . IBS (irritable bowel syndrome)   . IC (interstitial cystitis)   . Insomnia disorder related to known organic factor   . Osteoporosis   . Vitamin D deficiency    Past Surgical History:  Procedure Laterality Date  . ABDOMINAL HYSTERECTOMY    . CARDIAC CATHETERIZATION  7/12   non obst dz  . CHOLECYSTECTOMY    . ROTATOR CUFF REPAIR     2 TIMES   Family History  Problem Relation Age of Onset  . Alcohol abuse Mother   . Heart attack Mother   . GER disease Mother   . GER disease Father   . Alcohol abuse Father   . Stroke Father   . Tuberculosis Father   . Heart attack Father   . Aneurysm Father   . Cancer Sister        FEMALE  . Mental illness Sister   . Depression Sister    Social History   Occupational History  . reitred Retired   Social History Main Topics  . Smoking status: Never Smoker  . Smokeless tobacco: Never Used  . Alcohol use 0.0 oz/week     Comment: rare  . Drug use: No  . Sexual activity: Not on file    Tobacco Counseling Counseling given: No   Activities of Daily Living In your present state of health, do you have any difficulty performing the following activities: 07/28/2016  Hearing? N  Vision? Y  Difficulty concentrating or making decisions? N  Walking or climbing stairs? N  Dressing or bathing? N  Doing errands, shopping? N  Preparing Food and eating ? N  Using the Toilet? N  In the past six months, have you accidently leaked urine? Y  Do you have problems with loss of bowel control? N  Managing your Medications? N  Managing your Finances? N  Housekeeping or managing your Housekeeping? N  Some recent data might be hidden    Immunizations and Health Maintenance Immunization History  Administered Date(s) Administered  . Influenza Split 12/29/2010, 11/18/2011  . Influenza,inj,Quad PF,36+ Mos 03/02/2013  . Influenza-Unspecified 11/22/2013, 11/23/2015  . Pneumococcal Conjugate-13 07/28/2016  . Pneumococcal  Polysaccharide-23 04/30/2008  . Td 04/30/2008, 04/17/2014  . Zoster 06/11/2010   There are no preventive care reminders to display for this patient.  Patient Care Team: Tower, [name], MD as PCP - General     Assessment:   This is a routine wellness examination for [name].   Hearing/Vision screen  Hearing Screening   125Hz  250Hz  500Hz  1000Hz  2000Hz  3000Hz  4000Hz  6000Hz  8000Hz   Right ear:   40 0 40  0    Left ear:   0 0 40  40    Vision Screening Comments: Last vision exam in 2018 with Dr. Bing Plume   Dietary issues and exercise activities discussed: Current Exercise Habits: The patient has a physically strenous job, but has no regular exercise apart from work. (pt has a horse farm ), Exercise limited by: None identified  Goals    . Increase water intake          Starting 07/28/16, I will continue to drink at least 6-8 glasses of water daily.       Depression Screen PHQ 2/9 Scores 07/28/2016  PHQ - 2 Score 0    Fall Risk Fall Risk  07/28/2016  Falls in the past year? Yes  Number falls in past yr: 1  Injury with Fall? No    Cognitive Function: MMSE - Mini Mental State Exam 07/28/2016  Orientation to time 5  Orientation to Place 5  Registration 3  Attention/ Calculation 0  Recall 3  Language- name 2 objects 0  Language- repeat 1  Language- follow 3 step command 3  Language- read & follow direction 0  Write a sentence 0  Copy design 0  Total score 20     PLEASE NOTE: A Mini-Cog screen was completed. Maximum score is 20. A value of 0 denotes this part of [place] MMSE was not completed or the patient failed this part of the Mini-Cog screening.   Mini-Cog Screening Orientation to Time - Max 5 pts Orientation to Place - Max 5 pts Registration - Max 3 pts Recall - Max 3 pts Language Repeat - Max 1 pts Language Follow 3 Step Command - Max 3 pts     Screening Tests Health Maintenance  Topic Date Due  . INFLUENZA VACCINE  09/22/2016  . TETANUS/TDAP  04/17/2024  .  DEXA SCAN  Completed  . PNA vac Low Risk Adult  Completed      Plan:     I have personally reviewed and addressed the Medicare Annual Wellness questionnaire and have noted the following in the patient's chart:  A. Medical and social history B. Use of alcohol, tobacco or illicit drugs  C. Current medications and supplements D. Functional ability and status E.  Nutritional status F.  Physical activity G. Advance  directives H. List of other physicians I.  Hospitalizations, surgeries, and ER visits in previous 12 months J.  [place] to include hearing, vision, cognitive, depression L. Referrals and appointments - none  In addition, I have reviewed and discussed with patient certain preventive protocols, quality metrics, and best practice recommendations. A written personalized care plan for preventive services as well as general preventive health recommendations were provided to patient.  See attached scanned questionnaire for additional information.   Signed,   Lindell Noe, MHA, BS, LPN Health Coach

## 2016-07-30 ENCOUNTER — Ambulatory Visit (INDEPENDENT_AMBULATORY_CARE_PROVIDER_SITE_OTHER): Payer: Medicare HMO | Admitting: Family Medicine

## 2016-07-30 ENCOUNTER — Encounter: Payer: Commercial Managed Care - HMO | Admitting: Family Medicine

## 2016-07-30 ENCOUNTER — Encounter: Payer: Self-pay | Admitting: Family Medicine

## 2016-07-30 VITALS — BP 124/60 | HR 70 | Temp 97.6°F | Ht 63.5 in | Wt 154.0 lb

## 2016-07-30 DIAGNOSIS — E2839 Other primary ovarian failure: Secondary | ICD-10-CM

## 2016-07-30 DIAGNOSIS — Z0000 Encounter for general adult medical examination without abnormal findings: Secondary | ICD-10-CM | POA: Diagnosis not present

## 2016-07-30 DIAGNOSIS — E7800 Pure hypercholesterolemia, unspecified: Secondary | ICD-10-CM | POA: Diagnosis not present

## 2016-07-30 DIAGNOSIS — Z1231 Encounter for screening mammogram for malignant neoplasm of breast: Secondary | ICD-10-CM

## 2016-07-30 DIAGNOSIS — E559 Vitamin D deficiency, unspecified: Secondary | ICD-10-CM | POA: Diagnosis not present

## 2016-07-30 DIAGNOSIS — I6300 Cerebral infarction due to thrombosis of unspecified precerebral artery: Secondary | ICD-10-CM | POA: Diagnosis not present

## 2016-07-30 DIAGNOSIS — I10 Essential (primary) hypertension: Secondary | ICD-10-CM | POA: Diagnosis not present

## 2016-07-30 DIAGNOSIS — M810 Age-related osteoporosis without current pathological fracture: Secondary | ICD-10-CM | POA: Diagnosis not present

## 2016-07-30 DIAGNOSIS — R7309 Other abnormal glucose: Secondary | ICD-10-CM | POA: Diagnosis not present

## 2016-07-30 MED ORDER — CLOPIDOGREL BISULFATE 75 MG PO TABS: 75.0000 mg | ORAL_TABLET | Freq: Every day | ORAL | 3 refills | Status: DC

## 2016-07-30 MED ORDER — ATORVASTATIN CALCIUM 40 MG PO TABS: 40.0000 mg | ORAL_TABLET | Freq: Every day | ORAL | 3 refills | Status: DC
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
## 2016-07-30 NOTE — Patient Instructions (Addendum)
As we age- falls happen more easily so use caution   Increase your vitamin D to 4000 iu daily for bone health   We will refer you for mammogram and bone density test   Get your colonoscopy as planned in July (you are mildly anemic)   You are borderline diabetic Please avoid sweets and excess carbohydrates as much as possible   Take care of yourself

## 2016-08-01 NOTE — Assessment & Plan Note (Signed)
Disc goals for lipids and reasons to control them Rev labs with pt Rev low sat fat diet in detail LDL at goal of below 70 with atorvastatin and diet for pt with hx of CAD and CVA

## 2016-08-01 NOTE — Assessment & Plan Note (Signed)
In setting of hx of CAD bp in fair control at this time  BP Readings from Last 1 Encounters:  07/30/16 124/60   No changes needed Disc lifstyle change with low sodium diet and exercise  Labs reviewed

## 2016-08-01 NOTE — Assessment & Plan Note (Signed)
Scheduled annual screening mammogram Nl breast exam today  Encouraged monthly self exams   

## 2016-08-01 NOTE — Assessment & Plan Note (Signed)
Level is 28.2 Disc inc daily dose of vit D3 to 4000 iu daily  Imp to bone and overall health

## 2016-08-01 NOTE — Assessment & Plan Note (Signed)
bp controlled as well as cholesterol On plavix and asa  Doing well-no re occurrence

## 2016-08-01 NOTE — Assessment & Plan Note (Signed)
Due for dexa -scheduled Disc need for calcium/ vitamin D/ wt bearing exercise and bone density test every 2 y to monitor Disc safety/ fracture risk in detail   Will inc vit D to 4000 iu daily

## 2016-08-01 NOTE — Assessment & Plan Note (Signed)
Lab Results  Component Value Date   HGBA1C 6.3 07/28/2016   disc imp of low glycemic diet and wt loss to prevent DM2

## 2016-08-01 NOTE — Assessment & Plan Note (Signed)
dexa ordered for post menopausal female

## 2016-08-01 NOTE — Assessment & Plan Note (Signed)
Reviewed health habits including diet and exercise and skin cancer prevention Reviewed appropriate screening tests for age  Also reviewed health mt list, fam hx and immunization status , as well as social and family history   See HPI  Rev AMW from 6/6 She may consider hearing aides later  Disc fall precaution  Labs rev As we age- falls happen more easily so use caution   Increase your vitamin D to 4000 iu daily for bone health   We will refer you for mammogram and bone density test   Get your colonoscopy as planned in July (you are mildly anemic)

## 2016-08-17 ENCOUNTER — Encounter: Payer: Self-pay | Admitting: Gastroenterology

## 2016-08-18 ENCOUNTER — Ambulatory Visit
Admission: RE | Admit: 2016-08-18 | Discharge: 2016-08-18 | Disposition: A | Discharge status: Home / Self Care | Payer: Medicare HMO | Source: Ambulatory Visit | Attending: Family Medicine | Admitting: Family Medicine

## 2016-08-18 DIAGNOSIS — Z1231 Encounter for screening mammogram for malignant neoplasm of breast: Secondary | ICD-10-CM | POA: Diagnosis not present

## 2016-08-18 DIAGNOSIS — Z780 Asymptomatic menopausal state: Secondary | ICD-10-CM | POA: Diagnosis not present

## 2016-08-18 DIAGNOSIS — M8589 Other specified disorders of bone density and structure, multiple sites: Secondary | ICD-10-CM | POA: Diagnosis not present

## 2016-08-18 DIAGNOSIS — E2839 Other primary ovarian failure: Secondary | ICD-10-CM

## 2016-08-19 ENCOUNTER — Other Ambulatory Visit: Payer: Self-pay | Admitting: Family Medicine

## 2016-08-19 ENCOUNTER — Encounter: Payer: Self-pay | Admitting: *Deleted

## 2016-08-19 DIAGNOSIS — R928 Other abnormal and inconclusive findings on diagnostic imaging of breast: Secondary | ICD-10-CM

## 2016-08-24 ENCOUNTER — Other Ambulatory Visit: Payer: Self-pay | Admitting: Family Medicine

## 2016-08-24 ENCOUNTER — Ambulatory Visit
Admission: RE | Admit: 2016-08-24 | Discharge: 2016-08-24 | Disposition: A | Discharge status: Home / Self Care | Payer: Medicare HMO | Source: Ambulatory Visit | Attending: Family Medicine | Admitting: Family Medicine

## 2016-08-24 DIAGNOSIS — R928 Other abnormal and inconclusive findings on diagnostic imaging of breast: Secondary | ICD-10-CM

## 2016-08-24 DIAGNOSIS — M7989 Other specified soft tissue disorders: Secondary | ICD-10-CM

## 2016-08-24 DIAGNOSIS — N6489 Other specified disorders of breast: Secondary | ICD-10-CM | POA: Diagnosis not present

## 2016-08-26 ENCOUNTER — Ambulatory Visit (INDEPENDENT_AMBULATORY_CARE_PROVIDER_SITE_OTHER): Payer: Medicare HMO | Admitting: Family Medicine

## 2016-08-26 ENCOUNTER — Encounter: Payer: Self-pay | Admitting: Family Medicine

## 2016-08-26 VITALS — BP 138/78 | HR 71 | Temp 98.3°F | Wt 152.5 lb

## 2016-08-26 DIAGNOSIS — N3010 Interstitial cystitis (chronic) without hematuria: Secondary | ICD-10-CM | POA: Diagnosis not present

## 2016-08-26 DIAGNOSIS — S30860A Insect bite (nonvenomous) of lower back and pelvis, initial encounter: Secondary | ICD-10-CM

## 2016-08-26 DIAGNOSIS — W57XXXA Bitten or stung by nonvenomous insect and other nonvenomous arthropods, initial encounter: Secondary | ICD-10-CM | POA: Diagnosis not present

## 2016-08-26 DIAGNOSIS — R300 Dysuria: Secondary | ICD-10-CM

## 2016-08-26 LAB — POC URINALSYSI DIPSTICK (AUTOMATED)
Bilirubin, UA: NEGATIVE
Blood, UA: NEGATIVE
Glucose, UA: NEGATIVE
Ketones, UA: NEGATIVE
Leukocytes, UA: NEGATIVE
Nitrite, UA: NEGATIVE
Protein, UA: NEGATIVE
Spec Grav, UA: >=1.03 — AB (ref 1.010–1.025)
Urobilinogen, UA: 0.2 E.U./dL
pH, UA: 6 (ref 5.0–8.0)

## 2016-08-26 NOTE — Progress Notes (Signed)
Tick bite.  Noted on her back.  She got a friend to take it off.  No fevers, no chills.  No rash.  Locally itchy at the bite site.    H/o IC.  Some burning with urination.  She didn't know if dietary changes caused some dysuria.  No fevers.  U/a unremarkable, d/w pt, except for SG elevation.  She feels some better today.    Meds, vitals, and allergies reviewed.   ROS: Per HPI unless specifically indicated in ROS section   nad ncat Neck supple, no LA rrr ctab abd soft, not ttp Ext w/o edema Skin without tick noted at bite site on back.  Small scab noted.  Minimal peripheral skin irritation at the site but doesn't appear infected.

## 2016-08-26 NOTE — Patient Instructions (Signed)
Try to put some hydrocortisone cream on the area for itching.  If you have a lot of pain at the bite site, or if you have a fever, or if you have other rashes, then let us know.  Drink enough water to keep your urine clear or light colored.   Take care.  Glad to see you.

## 2016-08-26 NOTE — Assessment & Plan Note (Signed)
Wouldn't start abx at this point, with only mild local sx.  See AVS. Routine cautions given.  Update [place] if any other concerns.  She agrees.

## 2016-08-26 NOTE — Assessment & Plan Note (Signed)
With unremarkable u/a, d/w pt.  Would have her drink more water in the meantime.  Sx already better today.

## 2016-08-31 ENCOUNTER — Encounter: Payer: Self-pay | Admitting: Gastroenterology

## 2016-08-31 ENCOUNTER — Other Ambulatory Visit (INDEPENDENT_AMBULATORY_CARE_PROVIDER_SITE_OTHER): Payer: Medicare HMO

## 2016-08-31 ENCOUNTER — Other Ambulatory Visit: Payer: Self-pay

## 2016-08-31 ENCOUNTER — Telehealth: Payer: Self-pay

## 2016-08-31 ENCOUNTER — Ambulatory Visit (AMBULATORY_SURGERY_CENTER): Payer: Medicare HMO | Admitting: Gastroenterology

## 2016-08-31 VITALS — BP 144/64 | HR 58 | Temp 98.0°F | Resp 13 | Ht 64.0 in | Wt 152.0 lb

## 2016-08-31 DIAGNOSIS — K625 Hemorrhage of anus and rectum: Secondary | ICD-10-CM

## 2016-08-31 DIAGNOSIS — K639 Disease of intestine, unspecified: Secondary | ICD-10-CM | POA: Diagnosis not present

## 2016-08-31 DIAGNOSIS — C189 Malignant neoplasm of colon, unspecified: Secondary | ICD-10-CM | POA: Diagnosis not present

## 2016-08-31 DIAGNOSIS — C183 Malignant neoplasm of hepatic flexure: Secondary | ICD-10-CM | POA: Diagnosis not present

## 2016-08-31 DIAGNOSIS — K6389 Other specified diseases of intestine: Secondary | ICD-10-CM

## 2016-08-31 DIAGNOSIS — C184 Malignant neoplasm of transverse colon: Secondary | ICD-10-CM | POA: Diagnosis not present

## 2016-08-31 LAB — CREATININE, SERUM: Creatinine, Ser: 0.83 mg/dL (ref 0.40–1.20)

## 2016-08-31 LAB — BUN: BUN: 12 mg/dL (ref 6–23)

## 2016-08-31 MED ORDER — SODIUM CHLORIDE 0.9 % IV SOLN: 500.0000 mL | INTRAVENOUS | Status: DC

## 2016-08-31 NOTE — Op Note (Signed)
[place] Patient Name: [place] Procedure Date: 08/31/2016 3:11 PM MRN: [id number] Endoscopist: [name] , MD Age: 76 Referring MD:  Date of Birth: 10/12/1940 Gender: Female Account #: [id number] Procedure:                Colonoscopy Indications:              Rectal bleeding, Constipation; colonoscopy with                            [facility] 7 years ago was normal Medicines:                Monitored Anesthesia Care Procedure:                Pre-Anesthesia Assessment:                           - Prior to the procedure, a History and Physical                            was performed, and patient medications and                            allergies were reviewed. The patient's tolerance of                            previous anesthesia was also reviewed. The risks                            and benefits of the procedure and the sedation                            options and risks were discussed with the patient.                            All questions were answered, and informed consent                            was obtained. Prior Anticoagulants: The patient has                            taken Plavix (clopidogrel), last dose was 1 day                            prior to procedure. ASA Grade Assessment: III - A                            patient with severe systemic disease. After                            reviewing the risks and benefits, the patient was                            deemed in satisfactory condition to undergo the  procedure.                           After obtaining informed consent, the colonoscope                            was passed under direct vision. Throughout the                            procedure, the patient's blood pressure, pulse, and                            oxygen saturations were monitored continuously. The                            Colonoscope was introduced through the anus and                  advanced to the the cecum, identified by                            appendiceal orifice and ileocecal valve. The                            colonoscopy was performed without difficulty. The                            patient tolerated the procedure well. The quality                            of the bowel preparation was good. The ileocecal                            valve, appendiceal orifice, and rectum were                            photographed. Scope In: 3:14:37 PM Scope Out: 3:36:32 PM Scope Withdrawal Time: 0 hours 16 minutes 52 seconds  Total Procedure Duration: 0 hours 21 minutes 55 seconds  Findings:                 There was a 2cm polypoid mass at the hepatic                            flexure, this was soft but clearly neoplastic                            appearing. Biopsies were taken with a cold forceps                            for histology. Area was tattooed with an injection                            of Spot (carbon black).  There was a 4cm clearly malignant mass in the                            distal transverse colon. This was 1/2                            circumferential, ulcerated and firm. Biopsies were                            taken with a cold forceps for histology. Area was                            tattooed with an injection of Spot (carbon black).                           There were four small (3-25mm) scattered typical                            appearing adenomas throughout the colon which I did                            not remove since she had mistakenly stayed on her                            plavix.                           The exam was otherwise without abnormality on                            direct and retroflexion views. Complications:            No immediate complications. Estimated blood loss:                            None. Estimated Blood Loss:     Estimated blood loss: none. Impression:                - 1) Polypoid mass at the hepatic flexure, 2cm,                            soft but clearly neoplastic. This was biopsied and                            the site was injected with Spot.                           - 2) Clearly malignant mass in the distal                            transverse colon; 4cm, firm, 1/2 cirumference,                            ulcerated. This wsa biopsied and the site was also  injected with Spot.                           - 3) Four small (3-70mm) scattered typical appearing                            adenomas throughout the colon, not removed since                            she mistakenly stayed on her plavix for this                            procedure. Recommendation:           - Patient has a contact number available for                            emergencies. The signs and symptoms of potential                            delayed complications were discussed with the                            patient. Return to normal activities tomorrow.                            Written discharge instructions were provided to the                            patient.                           - Resume previous diet.                           - Continue present medications (OK to stay on your                            plavix for now).                           - Await pathology results.                           - My office will beging staging workup wtih labs                            (CEA level) and imaging (CT scan chest, abdomen,                            pelvis) for newly diagnosed colon cancer. [name], MD 08/31/2016 3:54:51 PM This report has been signed electronically.

## 2016-08-31 NOTE — Progress Notes (Signed)
Called to room to assist during endoscopic procedure.  Patient ID and intended procedure confirmed with present staff. Received instructions for my participation in the procedure from the performing physician.  

## 2016-08-31 NOTE — Progress Notes (Signed)
Spontaneous respirations throughout. VSS. Resting comfortably. To PACU on room air. Report to  [employer].

## 2016-08-31 NOTE — Telephone Encounter (Signed)
Pt has been given the instructions and contrast by Vinnie Level in the [employer] have been scheduled for a CT scan of the chest, abdomen and pelvis at [facility] ([address]---this is in the same building as Press photographer).   You are scheduled on 09/01/16 at 4 pm. You should arrive 15 minutes prior to your appointment time for registration. Please follow the written instructions below on the day of your exam:  WARNING: IF YOU ARE ALLERGIC TO IODINE/X-RAY DYE, PLEASE NOTIFY RADIOLOGY IMMEDIATELY AT [phone]! YOU WILL BE GIVEN A 13 HOUR PREMEDICATION PREP.  1) Do not eat or drink anything after 12 noon (4 hours prior to your test) 2) You have been given 2 bottles of oral contrast to drink. The solution may taste better if refrigerated, but do NOT add ice or any other liquid to this solution. Shake well before drinking.   Drink 1 bottle of contrast @ 2 pm (2 hours prior to your exam)   Drink 1 bottle of contrast @ 3 pm (1 hour prior to your exam)  You may take any medications as prescribed with a small amount of water except for the following: Metformin, Glucophage, Glucovance, Avandamet, Riomet, Fortamet, Actoplus Met, Janumet, Glumetza or Metaglip. The above medications must be held the day of the exam AND 48 hours after the exam.  The purpose of you drinking the oral contrast is to aid in the visualization of your intestinal tract. The contrast solution may cause some diarrhea. Before your exam is started, you will be given a small amount of fluid to drink. Depending on your individual set of symptoms, you may also receive an intravenous injection of x-ray contrast/dye. Plan on being at [facility] for 30 minutes or longer, depending on the type of exam you are having performed.  This test typically takes 30-45 minutes to complete.  If you have any questions regarding your exam or if you need to reschedule, you may call the CT department at [phone] between  the hours of 8:00 am and 5:00 pm, Monday-Friday.  ______________________________________________

## 2016-08-31 NOTE — Progress Notes (Signed)
Pt's states no medical or surgical changes since previsit or office visit. 

## 2016-08-31 NOTE — Patient Instructions (Signed)
YOU HAD AN ENDOSCOPIC PROCEDURE TODAY AT [facility]:   Refer to the procedure report that was given to you for any specific questions about what was found during the examination.  If the procedure report does not answer your questions, please call your gastroenterologist to clarify.  If you requested that your care partner not be given the details of your procedure findings, then the procedure report has been included in a sealed envelope for you to review at your convenience later.  YOU SHOULD EXPECT: Some feelings of bloating in the abdomen. Passage of more gas than usual.  Walking can help get rid of the air that was put into your GI tract during the procedure and reduce the bloating. If you had a lower endoscopy (such as a colonoscopy or flexible sigmoidoscopy) you may notice spotting of blood in your stool or on the toilet paper. If you underwent a bowel prep for your procedure, you may not have a normal bowel movement for a few days.  Please Note:  You might notice some irritation and congestion in your nose or some drainage.  This is from the oxygen used during your procedure.  There is no need for concern and it should clear up in a day or so.  SYMPTOMS TO REPORT IMMEDIATELY:   Following lower endoscopy (colonoscopy or flexible sigmoidoscopy):  Excessive amounts of blood in the stool  Significant tenderness or worsening of abdominal pains  Swelling of the abdomen that is new, acute  Fever of 100F or higher   For urgent or emergent issues, a gastroenterologist can be reached at any hour by calling [phone].   DIET:  We do recommend a small meal at first, but then you may proceed to your regular diet.  Drink plenty of fluids but you should avoid alcoholic beverages for 24 hours.  ACTIVITY:  You should plan to take it easy for the rest of today and you should NOT DRIVE or use heavy machinery until tomorrow (because of the sedation medicines used during the test).     FOLLOW UP: Our staff will call the number listed on your records the next business day following your procedure to check on you and address any questions or concerns that you may have regarding the information given to you following your procedure. If we do not reach you, we will leave a message.  However, if you are feeling well and you are not experiencing any problems, there is no need to return our call.  We will assume that you have returned to your regular daily activities without incident.  If any biopsies were taken you will be contacted by phone or by letter within the next 1-3 weeks.  Please call us at [phone] if you have not heard about the biopsies in 3 weeks.    SIGNATURES/CONFIDENTIALITY: You and/or your care partner have signed paperwork which will be entered into your electronic medical record.  These signatures attest to the fact that that the information above on your After Visit Summary has been reviewed and is understood.  Full responsibility of the confidentiality of this discharge information lies with you and/or your care-partner.  We will send you to the lab today for bloodwork.   You have an appointment at [place] tomorrow at [address].   I will explain how to take your prep.  Nothing by mouth except your contrast after noon tomorrow.  Take the first contrast bottle at 1pm tomorrow and  the second at 2pm. You have the contrrast and the address of the CT scan.  Call the 3rd floor dek with any questions.  Okay to start your plavix.

## 2016-09-01 ENCOUNTER — Inpatient Hospital Stay: Admission: RE | Admit: 2016-09-01 | Payer: Medicare HMO | Source: Ambulatory Visit

## 2016-09-01 ENCOUNTER — Ambulatory Visit (INDEPENDENT_AMBULATORY_CARE_PROVIDER_SITE_OTHER)
Admission: RE | Admit: 2016-09-01 | Discharge: 2016-09-01 | Disposition: A | Discharge status: Home / Self Care | Payer: Medicare HMO | Source: Ambulatory Visit | Attending: Gastroenterology | Admitting: Gastroenterology

## 2016-09-01 ENCOUNTER — Telehealth: Payer: Self-pay | Admitting: *Deleted

## 2016-09-01 DIAGNOSIS — C189 Malignant neoplasm of colon, unspecified: Secondary | ICD-10-CM

## 2016-09-01 LAB — CEA: CEA: 2 ng/mL

## 2016-09-01 MED ORDER — IOPAMIDOL (ISOVUE-300) INJECTION 61%
100.0000 mL | Freq: Once | INTRAVENOUS | Status: AC | PRN
  Administered 2016-09-01: 100 mL via INTRAVENOUS

## 2016-09-01 NOTE — Telephone Encounter (Signed)
  Follow up Call-  Call back number 08/31/2016  Post procedure Call Back phone  # [phone]  Permission to leave phone message Yes  Some recent data might be hidden     Patient questions:  Do you have a fever, pain , or abdominal swelling? no Pain score  0  Have you tolerated food without any problems?yes  Have you been able to return to your normal activities? yes  Do you have any questions about your discharge instructions: Diet   yes Medications  no Follow up visit  yes  Do you have questions or concerns about your Care? yes  Actions: * If pain score is 4 or above: no action needed, pain less than 4.  Pt. Had questions about her test and preparation. Her questions were answered and she was advised To call back if she has any questions or concerns.  She verbalized understanding.

## 2016-09-02 ENCOUNTER — Telehealth: Payer: Self-pay | Admitting: Gastroenterology

## 2016-09-02 NOTE — Telephone Encounter (Signed)
The pt has been advised as soon as Dr [name] reviews the CT we will call her with recommendations.

## 2016-09-03 ENCOUNTER — Telehealth: Payer: Self-pay

## 2016-09-03 NOTE — Telephone Encounter (Signed)
Pt. Call received in endoscopy recovery room.  Pt. Reports she had CT scan Wed., and hasn't received a report yet.   Called Sanda Linger to return pt. Call @ [phone].

## 2016-09-06 ENCOUNTER — Other Ambulatory Visit: Payer: Self-pay

## 2016-09-06 ENCOUNTER — Telehealth: Payer: Self-pay

## 2016-09-06 ENCOUNTER — Ambulatory Visit (INDEPENDENT_AMBULATORY_CARE_PROVIDER_SITE_OTHER)
Admission: RE | Admit: 2016-09-06 | Discharge: 2016-09-06 | Disposition: A | Discharge status: Home / Self Care | Payer: Medicare HMO | Source: Ambulatory Visit | Attending: Gastroenterology | Admitting: Gastroenterology

## 2016-09-06 DIAGNOSIS — C189 Malignant neoplasm of colon, unspecified: Secondary | ICD-10-CM

## 2016-09-06 DIAGNOSIS — R918 Other nonspecific abnormal finding of lung field: Secondary | ICD-10-CM | POA: Diagnosis not present

## 2016-09-06 DIAGNOSIS — K769 Liver disease, unspecified: Secondary | ICD-10-CM

## 2016-09-06 MED ORDER — IOPAMIDOL (ISOVUE-300) INJECTION 61%
80.0000 mL | Freq: Once | INTRAVENOUS | Status: AC | PRN
  Administered 2016-09-06: 80 mL via INTRAVENOUS

## 2016-09-06 NOTE — Telephone Encounter (Signed)
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  It will be in EPIC after transcribed.

## 2016-09-06 NOTE — Telephone Encounter (Signed)
See result note.  

## 2016-09-07 ENCOUNTER — Other Ambulatory Visit: Payer: Self-pay

## 2016-09-07 DIAGNOSIS — R932 Abnormal findings on diagnostic imaging of liver and biliary tract: Secondary | ICD-10-CM

## 2016-09-07 DIAGNOSIS — C189 Malignant neoplasm of colon, unspecified: Secondary | ICD-10-CM

## 2016-09-07 NOTE — Progress Notes (Signed)
You have been scheduled for an MRI at [facility] on 09/15/16. Your appointment time is 8 am. Please arrive 15 minutes prior to your appointment time for registration purposes. Please make certain not to have anything to eat or drink 6 hours prior to your test. In addition, if you have any metal in your body, have a pacemaker or defibrillator, please be sure to let your ordering physician know. This test typically takes 45 minutes to 1 hour to complete. Should you need to reschedule, please call [phone] to do so.

## 2016-09-08 ENCOUNTER — Telehealth: Payer: Self-pay | Admitting: Hematology

## 2016-09-08 ENCOUNTER — Encounter: Payer: Self-pay | Admitting: Hematology

## 2016-09-08 NOTE — Telephone Encounter (Signed)
Appt has been scheduled for the pt to see Dr. Burr Medico on 8/2 at 11am. Pt agreed to the appt date and time. Letter mailed to the pt.

## 2016-09-15 ENCOUNTER — Ambulatory Visit (HOSPITAL_COMMUNITY)
Admission: RE | Admit: 2016-09-15 | Discharge: 2016-09-15 | Disposition: A | Discharge status: Home / Self Care | Payer: Medicare HMO | Source: Ambulatory Visit | Attending: Gastroenterology | Admitting: Gastroenterology

## 2016-09-15 ENCOUNTER — Ambulatory Visit (HOSPITAL_COMMUNITY): Payer: Medicare HMO

## 2016-09-15 DIAGNOSIS — K769 Liver disease, unspecified: Secondary | ICD-10-CM | POA: Diagnosis not present

## 2016-09-15 DIAGNOSIS — C189 Malignant neoplasm of colon, unspecified: Secondary | ICD-10-CM

## 2016-09-15 DIAGNOSIS — R932 Abnormal findings on diagnostic imaging of liver and biliary tract: Secondary | ICD-10-CM | POA: Diagnosis present

## 2016-09-15 DIAGNOSIS — I700 Atherosclerosis of aorta: Secondary | ICD-10-CM | POA: Diagnosis not present

## 2016-09-15 MED ORDER — GADOBENATE DIMEGLUMINE 529 MG/ML IV SOLN
15.0000 mL | Freq: Once | INTRAVENOUS | Status: AC | PRN
  Administered 2016-09-15: 14 mL via INTRAVENOUS

## 2016-09-17 ENCOUNTER — Telehealth: Payer: Self-pay | Admitting: Gastroenterology

## 2016-09-17 NOTE — Telephone Encounter (Signed)
She needs recall colonoscopy in 1 year

## 2016-09-20 NOTE — Telephone Encounter (Signed)
Recall placed for 1 year

## 2016-09-21 ENCOUNTER — Other Ambulatory Visit: Payer: Self-pay | Admitting: General Surgery

## 2016-09-21 MED ORDER — DEXTROSE 5 % IV SOLN
900.0000 mg | INTRAVENOUS | Status: AC
  Administered 2016-11-10: 900 mg via INTRAVENOUS

## 2016-09-21 MED ORDER — DEXTROSE 5 % IV SOLN: 5.0000 mg/kg | INTRAVENOUS | Status: DC

## 2016-09-23 ENCOUNTER — Ambulatory Visit (HOSPITAL_BASED_OUTPATIENT_CLINIC_OR_DEPARTMENT_OTHER): Payer: Medicare HMO | Admitting: Hematology

## 2016-09-23 ENCOUNTER — Ambulatory Visit (HOSPITAL_BASED_OUTPATIENT_CLINIC_OR_DEPARTMENT_OTHER): Payer: Medicare HMO

## 2016-09-23 ENCOUNTER — Telehealth: Payer: Self-pay | Admitting: Hematology

## 2016-09-23 ENCOUNTER — Encounter: Payer: Self-pay | Admitting: Hematology

## 2016-09-23 DIAGNOSIS — C787 Secondary malignant neoplasm of liver and intrahepatic bile duct: Principal | ICD-10-CM

## 2016-09-23 DIAGNOSIS — C183 Malignant neoplasm of hepatic flexure: Secondary | ICD-10-CM | POA: Diagnosis not present

## 2016-09-23 DIAGNOSIS — D649 Anemia, unspecified: Secondary | ICD-10-CM

## 2016-09-23 DIAGNOSIS — C189 Malignant neoplasm of colon, unspecified: Secondary | ICD-10-CM

## 2016-09-23 DIAGNOSIS — D500 Iron deficiency anemia secondary to blood loss (chronic): Secondary | ICD-10-CM

## 2016-09-23 DIAGNOSIS — C184 Malignant neoplasm of transverse colon: Secondary | ICD-10-CM

## 2016-09-23 DIAGNOSIS — C188 Malignant neoplasm of overlapping sites of colon: Secondary | ICD-10-CM

## 2016-09-23 DIAGNOSIS — Z8673 Personal history of transient ischemic attack (TIA), and cerebral infarction without residual deficits: Secondary | ICD-10-CM | POA: Diagnosis not present

## 2016-09-23 LAB — CBC & DIFF AND RETIC
BASO%: 0.6 % (ref 0.0–2.0)
Basophils Absolute: 0.1 10*3/uL (ref 0.0–0.1)
EOS%: 1.6 % (ref 0.0–7.0)
Eosinophils Absolute: 0.1 10*3/uL (ref 0.0–0.5)
HCT: 38.3 % (ref 34.8–46.6)
HGB: 12.2 g/dL (ref 11.6–15.9)
Immature Retic Fract: 2.2 % (ref 1.60–10.00)
LYMPH%: 31.4 % (ref 14.0–49.7)
MCH: 28.3 pg (ref 25.1–34.0)
MCHC: 31.9 g/dL (ref 31.5–36.0)
MCV: 88.9 fL (ref 79.5–101.0)
MONO#: 0.9 10*3/uL (ref 0.1–0.9)
MONO%: 10.7 % (ref 0.0–14.0)
NEUT#: 4.7 10*3/uL (ref 1.5–6.5)
NEUT%: 55.7 % (ref 38.4–76.8)
Platelets: 310 10*3/uL (ref 145–400)
RBC: 4.31 10*6/uL (ref 3.70–5.45)
RDW: 13.9 % (ref 11.2–14.5)
Retic %: 1.47 % (ref 0.70–2.10)
Retic Ct Abs: 63.36 10*3/uL (ref 33.70–90.70)
WBC: 8.4 10*3/uL (ref 3.9–10.3)
lymph#: 2.6 10*3/uL (ref 0.9–3.3)

## 2016-09-23 LAB — IRON AND TIBC
%SAT: 24 % (ref 21–57)
Iron: 81 ug/dL (ref 41–142)
TIBC: 340 ug/dL (ref 236–444)
UIBC: 259 ug/dL (ref 120–384)

## 2016-09-23 LAB — COMPREHENSIVE METABOLIC PANEL
ALT: 20 U/L (ref 0–55)
AST: 22 U/L (ref 5–34)
Albumin: 3.6 g/dL (ref 3.5–5.0)
Alkaline Phosphatase: 94 U/L (ref 40–150)
Anion Gap: 7 mEq/L (ref 3–11)
BUN: 15.2 mg/dL (ref 7.0–26.0)
CO2: 27 mEq/L (ref 22–29)
Calcium: 9.3 mg/dL (ref 8.4–10.4)
Chloride: 107 mEq/L (ref 98–109)
Creatinine: 0.8 mg/dL (ref 0.6–1.1)
EGFR: 72 mL/min/{1.73_m2} — ABNORMAL LOW (ref >90)
Glucose: 94 mg/dl (ref 70–140)
Potassium: 4.1 mEq/L (ref 3.5–5.1)
Sodium: 141 mEq/L (ref 136–145)
Total Bilirubin: 0.7 mg/dL (ref 0.20–1.20)
Total Protein: 6.9 g/dL (ref 6.4–8.3)

## 2016-09-23 LAB — FERRITIN: Ferritin: 20 ng/ml (ref 9–269)

## 2016-09-23 NOTE — Progress Notes (Signed)
[place]  [phone] Fax:([phone]) [place] Note   Patient Care Team: Tower, [name], MD as PCP - [name], MD as Consulting Physician (Hematology) [name], MD as Attending Physician (Gastroenterology) 09/23/2016  REFERRAL PHYSICIAN: [name]  CHIEF COMPLAINTS/PURPOSE OF CONSULTATION:  Colorectal adenocarcinoma, moderately differentiated    Cancer of overlapping sites of colon [facility])   08/31/2016 Tumor Marker    Patient's tumor was tested for the following markers: CEA. Results of the tumor marker test revealed 2.0.      08/31/2016 Pathology Results    Colon biopsy Diagnosis: 1. Surgical [P], hepatic flexure. ADENOCARCINOMA. Moderately differentiated. 2. Surgical [P], transverse. ADENOCARCINOMA, Moderately differentiated.      08/31/2016 Initial Diagnosis    Colon cancer metastasized to liver ([facility])      08/31/2016 Procedure    - 1) Polypoid mass at the hepatic flexure, 2cm, soft but clearly neoplastic.  - 2) Clearly malignant mass in the distal transverse colon; 4cm, firm, 1/2 cirumference, ulcerated. ' - 3) Four small (3-37mm) scattered typical appearing adenomas throughout the colon, notremoved since she mistakenly stayed on her plavix for this procedure.      09/01/2016 Imaging    CT Abdomen Pelvis W Contrast IMPRESSION: There are 2 colonic lesions involving the proximal ascending colon and proximal transverse colon, as detailed above, highly concerning for colonic neoplasm. In addition, there are at least 2 indeterminate liver lesions which are suspicious for potential metastatic disease.       09/16/2016 Imaging    Mr Liver W Wo Contrast IMPRESSION: Lesion within central right lobe of liver exhibits of peripheral enhancement and is suspicious for liver metastasis. Lesion within caudate lobe of liver identified on recent CT does not have a corresponding signal or enhancement abnormality on today's study. Within  the inferior right lobe of liver there is an enhancing structure which is favored to represent an atypical benign hemangioma with liver metastasis felt less likely. The transverse colon lesion is again identified compatible with colonic adenocarcinoma.       HISTORY OF PRESENTING ILLNESS:  [name] 76 y.o. female is here because of Colorectal adenocarcinoma, moderately differentiated  Referred by Gastroenterologist, [name] for abnormal colon biopsy. Accompanied by family friend.   Pt initially presented with one episode of rectal bleeding one year ago, but did not pursue further workup until lately, she was referred to Gastroenterologist, [name] on 07/13/16 for Routine colonoscopy. She was referred to him by [name] for repeat colonoscopy for colon CA screening. Pt last colonoscopy was with [name] in August 2011 that was completed for hx of polyps and was significant for hemorrhoids and otherwise negative.   On 08/31/16, pt underwent screening colonoscopy, which unfortunately showed 2 masses in the proximal and distant transverse colon, biopsy both showed moderately differentiated adenocarcinoma.   Pt had a CT Abdomen Pelvis that was ordered by [name] and completed on 09/01/16 and significant for colonic neoplasm and 2 indeterminate liver lesions suspicious for potential metastatic dx.   Pt had a CT Chest W Constrast that was ordered by [name] and completed on 09/06/16 and significant for no metastasis.    Pt had MR Liver W WO Contrast that was ordered by [name] and completed on 09/16/16 and significant for colonic adenocarcinoma and indeterminate liver lesions.  Pt notes that she was referred to the office by [name]. Pt has  an appointment with Surgeon, [name] tomorrow, 8/3 to discuss her options. She notes that [name] recommended a liver biopsy. Pt states that she had 1 year ago she had one large episode  of rectal bleeding following consuming a milkshake, 1 episode of emesis, and a large episode of rectal bleeding that resolved on its own. Denies recurrent bleeding following initial rectal bleeding. Denies being evaluated last year for her rectal bleeding due to having issues with finding a GI specialist. Pt last colonoscopy was 7 years ago. Pt reports that due to her chronic constipation, she uses suppositories and OTC laxatives. She states that she eats mainly meat and starches due to her dislike for vegetables. She notes that she recently lost approximately 10 lbs over the past year. 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 She states that she lives with her husband and has 3 older children, 2 in [place] and 1 in [place]. Pt friend notes that the pt grandchildren intermittently help her out. She states that her husband has severe medical issues with internal bleeding and is currently on oxygen. Pt reports that she completes all of the housework and outside work due to her husband being unable to complete daily activities.   Pt has intermittent GERD that she used to take Nexium for. Denies PMHx of HTN, cardiac issues. Denies taking HTN medications. Denies MI, but notes that she was evaluated for CP symptoms and was found to have a 20% blockage in her arteries. Pt reports that she had a stroke with resolved residual right sided weakness summer 2017 that she was evaluated the next day at [facility] and was informed by her horse vet to be evaluated. Pt states that she took 2 ASA following her initial symptoms. Pt reports that she takes ASA and plavix for. She states that she stopped taking her plavix 2 weeks ago following the colonoscopy. Pt sates that she takes Rx Lipitor for cholesterol. Pt reports that she takes multi-vitamin and that [name] recommended that she takes vitamin-D.   Pt had cholecystectomy and  partial hysterectomy with one ovary left. Pt denies family hx of CA. Denies smoking cigarettes, but reports that her husband used to smoke cigarettes. Pt denies ETOH use at this time.   On review of systems, pt denies fever, chills. Endorses 10 lb weight loss, decreased energy levels. Denies pain. Pt reports constipation treated with laxatives. Pt reports that she has hemorrhoids. Pt denies abdominal pain, nausea, vomiting, abdominal distension.    MEDICAL HISTORY:  Past Medical History:  Diagnosis Date  . Coronary artery disease, non-occlusive   . GERD (gastroesophageal reflux disease)   . Heel spur   . HSV (herpes simplex virus) infection   . Hyperglycemia   . Hyperlipidemia   . IBS (irritable bowel syndrome)   . IC (interstitial cystitis)   . Insomnia disorder related to known organic factor   . Osteoporosis   . Stroke ([place])   . Vitamin D deficiency     SURGICAL HISTORY: Past Surgical History:  Procedure Laterality Date  . ABDOMINAL HYSTERECTOMY    . CARDIAC CATHETERIZATION  7/12   non obst dz  . CHOLECYSTECTOMY    . ROTATOR CUFF REPAIR     2 TIMES    SOCIAL HISTORY: Social History   Social History  . Marital status: Married    Spouse name: N/A  . Number of children: 4  .  Years of education: N/A   Occupational History  . reitred Retired   Social History Main Topics  . Smoking status: Never Smoker  . Smokeless tobacco: Never Used  . Alcohol use 0.0 oz/week     Comment: rare  . Drug use: No  . Sexual activity: Not on file   Other Topics Concern  . Not on file   Social History Narrative   Rides horses         Specialty providers    [facility]   Cardio- [place]   Ortho--[place]   Rheum-[facility]   urol-[facility]   [facility]-- [facility]    FAMILY HISTORY: Family History  Problem Relation Age of Onset  . Alcohol abuse Mother   . Heart attack Mother   . GER disease Mother   . GER disease Father   . Alcohol abuse Father   . Stroke Father   .  Tuberculosis Father   . Heart attack Father   . Aneurysm Father   . Mental illness Sister   . Depression Sister   . Breast cancer Neg Hx     ALLERGIES:  is allergic to ampicillin; codeine; meperidine hcl; penicillins; propoxyphene hcl; sulfa antibiotics; and tramadol hcl.  MEDICATIONS:  Current Outpatient Prescriptions  Medication Sig Dispense Refill  . aspirin 81 MG tablet Take 81 mg by mouth daily.    Marland Kitchen atorvastatin (LIPITOR) 40 MG tablet Take 1 tablet (40 mg total) by mouth daily at 6 PM. 90 tablet 3  . Biotin 5000 MCG CAPS Take 5,000 mcg by mouth daily.    . cholecalciferol (VITAMIN D) 1000 units tablet Take 1,000 Units by mouth daily.    . COMBIGAN 0.2-0.5 % ophthalmic solution Place 1 drop into both eyes 2 (two) times daily.  2  . Glucosamine HCl (GLUCOSAMINE PO) Take 1 tablet by mouth daily.    Marland Kitchen LUMIGAN 0.01 % SOLN Place 1 drop into both eyes at bedtime.    . Multiple Vitamin (MULTIVITAMIN WITH MINERALS) TABS tablet Take 1 tablet by mouth daily.    . clopidogrel (PLAVIX) 75 MG tablet Take 1 tablet (75 mg total) by mouth daily. (Patient not taking: Reported on 09/23/2016) 90 tablet 3  . valACYclovir (VALTREX) 500 MG tablet Take 1 tablet (500 mg total) by mouth 2 (two) times daily. (Patient not taking: Reported on 09/23/2016) 6 tablet 2   Current Facility-Administered Medications  Medication Dose Route Frequency Provider Last Rate Last Dose  . 0.9 %  sodium chloride infusion  500 mL Intravenous Continuous [name], MD       Facility-Administered Medications Ordered in Other Visits  Medication Dose Route Frequency Provider Last Rate Last Dose  . clindamycin (CLEOCIN) 900 mg in dextrose 5 % 50 mL IVPB  900 mg Intravenous 60 min Pre-Op [name], MD       And  . gentamicin (GARAMYCIN) 340 mg in dextrose 5 % 50 mL IVPB  5 mg/kg Intravenous 60 min Pre-Op [name], MD        REVIEW OF SYSTEMS:  Constitutional: Denies fevers, chills or abnormal night sweats Eyes:  Denies blurriness of vision, double vision or watery eyes Ears, nose, mouth, throat, and face: Denies mucositis or sore throat Respiratory: Denies cough, dyspnea or wheezes Cardiovascular: Denies palpitation, chest discomfort or lower extremity swelling Gastrointestinal:  Denies nausea, heartburn or change in bowel habits Skin: Denies abnormal skin rashes Lymphatics: Denies Yvonne lymphadenopathy or easy bruising Neurological:Denies numbness, tingling or Yvonne weaknesses Behavioral/Psych: Mood is stable, no  Yvonne changes  All other systems were reviewed with the patient and are negative.  PHYSICAL EXAMINATION: ECOG PERFORMANCE STATUS: 1  Vitals:   09/23/16 1135  BP: (!) 161/72  Pulse: 63  Resp: 18  Temp: 98.1 F (36.7 C)   Filed Weights   09/23/16 1135  Weight: 154 lb 3.2 oz (69.9 kg)    GENERAL:alert, no distress and comfortable SKIN: skin color, texture, turgor are normal, no rashes or significant lesions EYES: normal, conjunctiva are pink and non-injected, sclera clear OROPHARYNX:no exudate, no erythema and lips, buccal mucosa, and tongue normal  NECK: supple, thyroid normal size, non-tender, without nodularity LYMPH:  no palpable lymphadenopathy in the cervical, axillary or inguinal LUNGS: clear to auscultation and percussion with normal breathing effort HEART: regular rate & rhythm and no murmurs and no lower extremity edema ABDOMEN:abdomen soft, non-tender and normal bowel sounds. No organomegaly noted.  Musculoskeletal:no cyanosis of digits and no clubbing  PSYCH: alert & oriented x 3 with fluent speech NEURO: no focal motor/sensory deficits   LABORATORY DATA:  I have reviewed the data as listed CBC Latest Ref Rng & Units 09/23/2016 07/28/2016 05/07/2015  WBC 3.9 - 10.3 10e3/uL 8.4 6.7 7.3  Hemoglobin 11.6 - 15.9 g/dL 12.2 11.8(L) 12.8  Hematocrit 34.8 - 46.6 % 38.3 35.5(L) 37.9  Platelets 145 - 400 10e3/uL 310 293.0 235    CMP Latest Ref Rng & Units 09/23/2016 08/31/2016  07/28/2016  Glucose 70 - 140 mg/dl 94 - 96  BUN 7.0 - 26.0 mg/dL 15.2 12 19   Creatinine 0.6 - 1.1 mg/dL 0.8 0.83 0.88  Sodium 136 - 145 mEq/L 141 - 139  Potassium 3.5 - 5.1 mEq/L 4.1 - 4.2  Chloride 96 - 112 mEq/L - - 105  CO2 22 - 29 mEq/L 27 - 28  Calcium 8.4 - 10.4 mg/dL 9.3 - 9.4  Total Protein 6.4 - 8.3 g/dL 6.9 - 6.7  Total Bilirubin 0.20 - 1.20 mg/dL 0.70 - 0.6  Alkaline Phos 40 - 150 U/L 94 - 74  AST 5 - 34 U/L 22 - 18  ALT 0 - 55 U/L 20 - 19   CEA 08/31/16: 2.0   PATHOLOGY REPORT:  Colon biopsy, 08/31/16 Diagnosis 1. Surgical [P], hepatic flexure - ADENOCARCINOMA. Moderately differentiated.  2. Surgical [P], transverse - ADENOCARCINOMA, Moderately differentiated.  RADIOGRAPHIC STUDIES: I have personally reviewed the radiological images as listed and agreed with the findings in the report. Ct Chest W Contrast  Result Date: 09/06/2016 CLINICAL DATA:  Colon cancer.  Weight loss. EXAM: CT CHEST WITH CONTRAST TECHNIQUE: Multidetector CT imaging of the chest was performed during intravenous contrast administration. CONTRAST:  88mL ISOVUE-300 IOPAMIDOL (ISOVUE-300) INJECTION 61% COMPARISON:  Coronary calcium score CT from 09/11/2010. FINDINGS: Cardiovascular: Heart size upper normal. No pericardial effusion. Coronary artery calcification is noted. Atherosclerotic calcification is noted in the wall of the thoracic aorta. Mediastinum/Nodes: No mediastinal lymphadenopathy. There is no hilar lymphadenopathy. The esophagus has normal imaging features. There is no axillary lymphadenopathy. Lungs/Pleura: 4 mm right middle lobe pulmonary nodule is stable since prior chest CT. Another 4 mm right middle lobe nodule seen on image 82 is also unchanged. 4 mm right lower lobe nodule seen on image 81 is stable. No focal airspace consolidation. No pulmonary edema or pleural effusion. Upper Abdomen: 12 mm low-density lesion towards the dome of the liver is incompletely visualized and was better  characterized on the recent abdomen and pelvis CT. Gallbladder surgically absent. Musculoskeletal: 7 mm sclerotic focus in the T8  vertebral body has increased slightly in size, but was present on the study from 6 years ago, most compatible with benign etiology. IMPRESSION: 1. No CT findings to suggest metastatic disease in the chest. 2. Multiple right lung nodules are identified, but these were present on a previous CT from 6 years ago and are unchanged, consistent with benign etiology. 3. 12 mm low-density lesion in the right liver, incompletely visualized and better characterized on the recent abdomen and pelvis CT. Electronically Signed   By: Misty Stanley M.D.   On: 09/06/2016 17:07   Ct Abdomen Pelvis W Contrast  Result Date: 09/01/2016 CLINICAL DATA:  76 year old female with newly diagnosed colon cancer. Five day pound weight loss recently. Decreased appetite. EXAM: CT ABDOMEN AND PELVIS WITH CONTRAST TECHNIQUE: Multidetector CT imaging of the abdomen and pelvis was performed using the standard protocol following bolus administration of intravenous contrast. CONTRAST:  163mL ISOVUE-300 IOPAMIDOL (ISOVUE-300) INJECTION 61% COMPARISON:  No priors. FINDINGS: Lower chest: 4 mm right lower lobe pulmonary nodule (axial image 1 of series 3) is stable compared to prior cardiac CT 09/11/2010, considered definitively benign. Aortic atherosclerosis. Hepatobiliary: There are several hepatic lesions, the largest of which is in the inferior aspect of segment 5 of the liver (axial image 28 of series 2) measuring 1.8 x 1.0 cm demonstrating 65 HU, considered indeterminate. Some of the other smaller low-attenuation lesions are too small to definitively characterize, but are well-defined, favored to represent cysts. However, in the caudate lobe of the liver there is also a 9 mm intermediate attenuation lesion (axial image 11 of series 2) which is indeterminate but considered highly suspicious for a metastatic lesion. No  intra or extrahepatic biliary ductal dilatation. Status post cholecystectomy. Pancreas: No pancreatic mass. No pancreatic ductal dilatation. No pancreatic or peripancreatic fluid or inflammatory changes. Spleen: Unremarkable. Adrenals/Urinary Tract: Tiny nonobstructive calculi are noted within the upper and lower pole collecting systems of the left and right kidneys respectively measuring 2 mm. No hydroureteronephrosis. Subcentimeter low-attenuation lesions in both kidneys are too small to definitively characterize, but are favored to represent tiny cysts. Multiple left parapelvic cysts are noted. Urinary bladder is unremarkable in appearance. Bilateral adrenal glands are normal in appearance. Stomach/Bowel: The appearance of the stomach is normal. No pathologic dilatation of small bowel or colon. Focal area of mural thickening in the proximal transverse colon just distal to the hepatic flexure best appreciated on axial image 45 of series 2 and coronal image 18 of series 6. This lesion measures approximately 2.6 x 2.9 x 2.9 cm. In addition, in the proximal ascending colon there is a well-circumscribed 2.9 x 1.8 x 2.0 cm lesion as well (axial image 46 of series 2 and coronal image 30 of series 6). The appendix is not confidently identified and may be surgically absent. Regardless, there are no inflammatory changes noted adjacent to the cecum to suggest the presence of an acute appendicitis at this time. Vascular/Lymphatic: Aortic atherosclerosis, without evidence of aneurysm or dissection in the abdominal or pelvic vasculature. No lymphadenopathy noted in the abdomen or pelvis. Reproductive: Status post hysterectomy. Ovaries are not confidently identified may be surgically absent or atrophic. Other: No significant volume of ascites.  No pneumoperitoneum. Musculoskeletal: There are no aggressive appearing lytic or blastic lesions noted in the visualized portions of the skeleton. IMPRESSION: 1. There are 2 colonic  lesions involving the proximal ascending colon and proximal transverse colon, as detailed above, highly concerning for colonic neoplasm. In addition, there are at least 2 indeterminate  liver lesions which are suspicious for potential metastatic disease. Further evaluation with nonemergent abdominal MRI with and without IV gadolinium is strongly recommended in the near future to better characterize these lesions. 2. Aortic atherosclerosis. 3. Additional incidental findings, as above. Electronically Signed   By: Vinnie Langton M.D.   On: 09/01/2016 16:43   Mr Liver W Wo Contrast  Result Date: 09/16/2016 CLINICAL DATA:  Followup changes of possible metastatic disease. Colon cancer. EXAM: MRI ABDOMEN WITHOUT AND WITH CONTRAST TECHNIQUE: Multiplanar multisequence MR imaging of the abdomen was performed both before and after the administration of intravenous contrast. CONTRAST:  51mL MULTIHANCE GADOBENATE DIMEGLUMINE 529 MG/ML IV SOLN COMPARISON:  09/01/2016 FINDINGS: Lower chest: No acute findings. Hepatobiliary: Peripherally enhancing lesion within the right lobe of liver measures 13 mm is identified, image number 25 of series 903 and suspicious for liver metastases. There is no definite corresponding signal or enhancement abnormality within the caudate lobe to explain the lesion noted on CT. Within segment 6 of the liver there is a T1 hypointense lesion measuring 1.4 cm. This is mildly T2 hyperintense. There appears to be gradual peripheral enhancement associated with this structure suggesting a benign hemangioma. Pancreas: No mass, inflammatory changes, or other parenchymal abnormality identified. Spleen:  Within normal limits in size and appearance. Adrenals/Urinary Tract: Normal adrenal glands. Small bilateral kidney cysts. No solid enhancing masses or hydronephrosis Stomach/Bowel: No abnormal dilatation of the stomach and upper abdominal bowel loops. Mass involving the proximal transverse colon is again  identified, image 30 of series 7. This measures approximately 3.5 x 2.8 cm. Vascular/Lymphatic: Aortic atherosclerosis. No aneurysm. No upper abdominal adenopathy. Other:  No ascites or focal fluid collections identified. Musculoskeletal: No suspicious bone lesions identified. IMPRESSION: 1. Lesion within central right lobe of liver exhibits of peripheral enhancement and is suspicious for liver metastasis. Lesion within caudate lobe of liver identified on recent CT does not have a corresponding signal or enhancement abnormality on today's study. Within the inferior right lobe of liver there is an enhancing structure which is favored to represent an atypical benign hemangioma with liver metastasis felt less likely. Consider further staging with PET-CT. 2. The transverse colon lesion is again identified compatible with colonic adenocarcinoma 3.  Aortic Atherosclerosis (ICD10-I70.0). Electronically Signed   By: Kerby Moors M.D.   On: 09/16/2016 08:45   COLONOSCOPY 08/31/2016 DR. JACOBS  - 1) Polypoid mass at the hepatic flexure, 2cm, soft but clearly neoplastic. This was biopsied and the site was injected with Spot. - 2) Clearly malignant mass in the distal transverse colon; 4cm, firm, 1/2 cirumference, ulcerated. This wsa biopsied and the site was also injected with Spot. - 3) Four small (3-110mm) scattered typical appearing adenomas throughout the colon, not removed since she mistakenly stayed on her plavix for this procedure.  ASSESSMENT & PLAN:   [name], is a 76 y.o. caucasian female with colorectal adenocarcinoma, moderately differentiated, found on screening colonoscopy.   1. Synchronized colon adenocarcinoma, moderately differentiated, in hepatic fracture and distal transverse colon, [id number] -I have reviewed her colonoscopy, biopsy and CT and MRI image findings with patient and her friend in great details. -She has to synchronized colon mass in the transverse colon, post biopsy  showed moderately-differentiated adenocarcinoma -CT and abdominal MRI showed 2 lesions in right lobe of liver, at least one lesion is very suspicious for metastatic disease. -I recommend a PET scan for further evaluation of her liver disease, and try CT-guided liver biopsy to ruled out metastasis. -If the  above workup is negative for distant metastasis, she would be a candidate for colon surgery. She is scheduled to see surgeon [name] tomorrow. -If her PET scan shows hypermetabolic liver lesions, or her liver biopsy confirmed metastasis, I will recommend systemic chemotherapy for 3-6 months, and consider colon surgery and liver targeted therapy (metastecomy or ablation), which may potentially cure her metastatic cancer -she is 76 yo, with remote history of stroke, no significant other medical comorbidities, in good general health is overall, would be a candidate for chemotherapy. -I plan to see her back in 2 weeks to finalize her treatment plan.  2. Anemia -07/28/16 hemoglobin level 11.8 -Will check iron levels today -Advised to began taking iron pill  3. History of stroke  -No residual neuro deficits. She functions well at home. -Her aspirin and Plavix has been held since her colonoscopy, we'll continue holding them for now, and resume after liver biopsy.  4. ? HTN -her BP hight here today, pt states it's been normal at home -will monitor  Plan: -labs today -follow up for US guided liver biopsy and PET scan within next 2 weeks -Keep appointment with [name] tomorrow, 09/24/16 -follow up in office in 2 weeks to go over results.  Orders Placed This Encounter  Procedures  . NM PET Image Initial (PI) Skull Base To Thigh    Standing Status:   Future    Standing Expiration Date:   09/23/2017    Order Specific Question:   If indicated for the ordered procedure, I authorize the administration of a radiopharmaceutical per Radiology protocol    Answer:   Yes    Order Specific Question:    Preferred imaging location?    Answer:   [facility]    Order Specific Question:   Call Results- Best Contact Number?    Answer:   staging, evaluate liver lesion    Order Specific Question:   Radiology Contrast Protocol - do NOT remove file path    Answer:   [email or web address]  . CT Biopsy    Standing Status:   Future    Standing Expiration Date:   09/23/2017    Order Specific Question:   Lab orders requested (DO NOT place separate lab orders, these will be automatically ordered during procedure specimen collection):    Answer:   Surgical Pathology    Order Specific Question:   Reason for Exam (SYMPTOM  OR DIAGNOSIS REQUIRED)    Answer:   liver mass biopsy    Order Specific Question:   Preferred imaging location?    Answer:   [place] Endoscopy    Order Specific Question:   Radiology Contrast Protocol - do NOT remove file path    Answer:   [email or web address]  . CBC & Diff and Retic    Standing Status:   Standing    Number of Occurrences:   50    Standing Expiration Date:   09/23/2021  . Comprehensive metabolic panel    Standing Status:   Standing    Number of Occurrences:   50    Standing Expiration Date:   09/23/2021  . Iron and TIBC    Standing Status:   Standing    Number of Occurrences:   50    Standing Expiration Date:   09/23/2021  . Ferritin    Standing Status:   Standing    Number of Occurrences:   50    Standing Expiration Date:   09/23/2021    All questions were  answered. The patient knows to call the clinic with any problems, questions or concerns. I spent 50 minutes counseling the patient face to face. The total time spent in the appointment was 60 minutes and more than 50% was on counseling.  This document serves as a record of services personally performed by [name], MD. It was created on her behalf by Steva Colder, a trained medical scribe. The creation of this record is based on the scribe's personal  observations and the provider's statements to them. This document has been checked and approved by the attending provider.    [name], MD 09/23/2016 2:12 PM

## 2016-09-23 NOTE — Telephone Encounter (Signed)
Gave patient avs and Calendar for August.

## 2016-09-24 ENCOUNTER — Telehealth: Payer: Self-pay | Admitting: *Deleted

## 2016-09-24 DIAGNOSIS — C184 Malignant neoplasm of transverse colon: Secondary | ICD-10-CM | POA: Diagnosis not present

## 2016-09-24 NOTE — Telephone Encounter (Signed)
-----   Message from [name], MD sent at 09/24/2016 10:22 AM EDT ----- Regarding: RE: bloodthinner She has been off plavix and aspirin since her colonoscopy 2 weeks ago, please tell her not to restart when you call her to schedule biopsy  Thanks  Krista Blue  ----- Message ----- From: Roosvelt Maser Sent: 09/24/2016   8:55 AM To: [name], MD Subject: bloodthinner                                    Dr. Burr Medico,  Your patient is on Plavix and will need to hold this for 5 days prior to having this liver biopsy  Is it ok for her to hold this bloodthinner?  Thank you, [facility] Radiology scheduler

## 2016-09-24 NOTE — Telephone Encounter (Signed)
Informed pt to hold off on her plavix & aspirin for at least 5 days prior to biopsy.  Pt has started back yest & will stop as suggested.

## 2016-10-04 ENCOUNTER — Encounter (HOSPITAL_COMMUNITY)
Admission: RE | Admit: 2016-10-04 | Discharge: 2016-10-04 | Disposition: A | Discharge status: Home / Self Care | Payer: Medicare HMO | Source: Ambulatory Visit | Attending: Hematology | Admitting: Hematology

## 2016-10-04 DIAGNOSIS — C787 Secondary malignant neoplasm of liver and intrahepatic bile duct: Secondary | ICD-10-CM | POA: Insufficient documentation

## 2016-10-04 DIAGNOSIS — C189 Malignant neoplasm of colon, unspecified: Secondary | ICD-10-CM | POA: Insufficient documentation

## 2016-10-04 LAB — GLUCOSE, CAPILLARY: Glucose-Capillary: 104 mg/dL — ABNORMAL HIGH (ref 65–99)

## 2016-10-04 MED ORDER — FLUDEOXYGLUCOSE F - 18 (FDG) INJECTION
7.7400 | Freq: Once | INTRAVENOUS | Status: AC
  Administered 2016-10-04: 7.74 via INTRAVENOUS

## 2016-10-04 NOTE — Progress Notes (Signed)
[place]  Telephone:([phone]) [phone] Fax:([phone]) [place]   Patient Care Team: Tower, [name], MD as PCP - General [name], MD as Consulting Physician (Hematology) [name], MD as Attending Physician (Gastroenterology) [name], MD as Consulting Physician (General Surgery) 10/06/2016   CHIEF COMPLAINTS:  Follow up for Colorectal adenocarcinoma, moderately differentiated    Cancer of overlapping sites of colon [facility])   08/31/2016 Tumor Marker    Patient's tumor was tested for the following markers: CEA. Results of the tumor marker test revealed 2.0.      08/31/2016 Pathology Results    Colon biopsy Diagnosis: 1. Surgical [P], hepatic flexure. ADENOCARCINOMA. Moderately differentiated. 2. Surgical [P], transverse. ADENOCARCINOMA, Moderately differentiated.      08/31/2016 Initial Diagnosis    Colon cancer metastasized to liver ([place])      08/31/2016 Procedure    - 1) Polypoid mass at the hepatic flexure, 2cm, soft but clearly neoplastic.  - 2) Clearly malignant mass in the distal transverse colon; 4cm, firm, 1/2 cirumference, ulcerated. ' - 3) Four small (3-92mm) scattered typical appearing adenomas throughout the colon, notremoved since she mistakenly stayed on her plavix for this procedure.      09/01/2016 Imaging    CT Abdomen Pelvis W Contrast IMPRESSION: There are 2 colonic lesions involving the proximal ascending colon and proximal transverse colon, as detailed above, highly concerning for colonic neoplasm. In addition, there are at least 2 indeterminate liver lesions which are suspicious for potential metastatic disease.       09/16/2016 Imaging    Mr Liver W Wo Contrast IMPRESSION: Lesion within central right lobe of liver exhibits of peripheral enhancement and is suspicious for liver metastasis. Lesion within caudate lobe of liver identified on recent CT does not have a corresponding signal or enhancement abnormality on  today's study. Within the inferior right lobe of liver there is an enhancing structure which is favored to represent an atypical benign hemangioma with liver metastasis felt less likely. The transverse colon lesion is again identified compatible with colonic adenocarcinoma.      10/04/2016 PET scan    PET 10/04/16 IMPRESSION: 1. Highly hypermetabolic proximal transverse colon mass, maximum SUV 30.8. Small focus of hypermetabolic activity in the mesentery just above this mass probably represents a local involved lymph node. 2. Moderately hypermetabolic ascending colon mass, maximum SUV 8.7. 3. Highly hypermetabolic enlarged portacaval lymph node, maximum SUV 16.7. 4. Focus of hypermetabolic activity near the duodenum bulb could be physiologic or due to an adjacent lymph node. 5. None of the liver lesions are discernibly hypermetabolic. Particularly the more cephalad right hepatic lobe lesion merits surveillance, it had nonspecific enhancement characteristics on prior cross-sectional imaging and only a thin enhancing wall which could conceivably predispose to false negative due to central necrosis. 6. Several tiny chronic pulmonary nodules are likely benign. 7. Other imaging findings of potential clinical significance: Aortic Atherosclerosis (ICD10-I70.0). Coronary atherosclerosis. Bilateral nonobstructive nephrolithiasis.       HISTORY OF PRESENTING ILLNESS: 09/23/16 [name] 76 y.o. female is here because of Colorectal adenocarcinoma, moderately differentiated  Referred by Gastroenterologist, [name] for abnormal colon biopsy. Accompanied by family friend.   Pt initially presented with one episode of rectal bleeding one year ago, but did not pursue further workup until lately, she was referred to Gastroenterologist, [name] on 07/13/16 for Routine colonoscopy. She was referred to him by [name] for repeat colonoscopy for colon CA screening. Pt  last colonoscopy was with [name] in August 2011 that was completed for hx of polyps and was significant for hemorrhoids and otherwise negative.   On 08/31/16, pt underwent screening colonoscopy, which unfortunately showed 2 masses in the proximal and distant transverse colon, biopsy both showed moderately differentiated adenocarcinoma.   Pt had a CT Abdomen Pelvis that was ordered by [name] and completed on 09/01/16 and significant for colonic neoplasm and 2 indeterminate liver lesions suspicious for potential metastatic dx.   Pt had a CT Chest W Constrast that was ordered by [name] and completed on 09/06/16 and significant for no metastasis.    Pt had MR Liver W WO Contrast that was ordered by [name] and completed on 09/16/16 and significant for colonic adenocarcinoma and indeterminate liver lesions.  Pt notes that she was referred to the office by [name]. Pt has an appointment with Surgeon, [name] tomorrow, 8/3 to discuss her options. She notes that [name] recommended a liver biopsy. Pt states that she had 1 year ago she had one large episode of rectal bleeding following consuming a milkshake, 1 episode of emesis, and a large episode of rectal bleeding that resolved on its own. Denies recurrent bleeding following initial rectal bleeding. Denies being evaluated last year for her rectal bleeding due to having issues with finding a GI specialist. Pt last colonoscopy was 7 years ago. Pt reports that due to her chronic constipation, she uses suppositories and OTC laxatives. She states that she eats mainly meat and starches due to her dislike for vegetables. She notes that she recently lost approximately 10 lbs over the past year. 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 She states that she lives with her husband and has 3 older children, 2 in [place] and 1 in  [place]. Pt friend notes that the pt grandchildren intermittently help her out. She states that her husband has severe medical issues with internal bleeding and is currently on oxygen. Pt reports that she completes all of the housework and outside work due to her husband being unable to complete daily activities.   Pt has intermittent GERD that she used to take Nexium for. Denies PMHx of HTN, cardiac issues. Denies taking HTN medications. Denies MI, but notes that she was evaluated for CP symptoms and was found to have a 20% blockage in her arteries. Pt reports that she had a stroke with resolved residual right sided weakness summer 2017 that she was evaluated the next day at [facility] and was informed by her horse vet to be evaluated. Pt states that she took 2 ASA following her initial symptoms. Pt reports that she takes ASA and plavix for. She states that she stopped taking her plavix 2 weeks ago following the colonoscopy. Pt sates that she takes Rx Lipitor for cholesterol. Pt reports that she takes multi-vitamin and that [name] recommended that she takes vitamin-D.   Pt had cholecystectomy and partial hysterectomy with one ovary left. Pt denies family hx of CA. Denies smoking cigarettes, but reports that her husband used to smoke cigarettes. Pt denies ETOH use at this time.   On review of systems, pt denies fever, chills. Endorses 10 lb weight loss, decreased energy levels. Denies pain. Pt reports constipation treated with laxatives. Pt reports that she has hemorrhoids. Pt denies abdominal pain, nausea, vomiting, abdominal distension.    CURRENT  THERAPY:  PENDING Surgery   INTERVAL HISTORY:  [name] is here for a follow up. She presents to the clinic today with her family friend. She spoke with [name] she is shorten both colon and does not know is she will need colostomy bag.  She reports to having polyps because a previous colonoscopy was not shown so she wanted to know when  they would get that removed. She reports she does not eat as much and in her lower abdomen she will feel uncomfortable. She will have BM every 7 days.     MEDICAL HISTORY:  Past Medical History:  Diagnosis Date  . Coronary artery disease, non-occlusive   . GERD (gastroesophageal reflux disease)   . Heel spur   . HSV (herpes simplex virus) infection   . Hyperglycemia   . Hyperlipidemia   . IBS (irritable bowel syndrome)   . IC (interstitial cystitis)   . Insomnia disorder related to known organic factor   . Osteoporosis   . Stroke ([place])   . Vitamin D deficiency     SURGICAL HISTORY: Past Surgical History:  Procedure Laterality Date  . ABDOMINAL HYSTERECTOMY    . CARDIAC CATHETERIZATION  7/12   non obst dz  . CHOLECYSTECTOMY    . ROTATOR CUFF REPAIR     2 TIMES    SOCIAL HISTORY: Social History   Social History  . Marital status: Married    Spouse name: N/A  . Number of children: 4  . Years of education: N/A   Occupational History  . reitred Retired   Social History Main Topics  . Smoking status: Never Smoker  . Smokeless tobacco: Never Used  . Alcohol use 0.0 oz/week     Comment: rare  . Drug use: No  . Sexual activity: Not on file   Other Topics Concern  . Not on file   Social History Narrative   Rides horses         Specialty providers    [facility]   Cardio- [place]   Ortho--[place]   Rheum-[facility]   urol-[facility]   [facility]-- [facility]    FAMILY HISTORY: Family History  Problem Relation Age of Onset  . Alcohol abuse Mother   . Heart attack Mother   . GER disease Mother   . GER disease Father   . Alcohol abuse Father   . Stroke Father   . Tuberculosis Father   . Heart attack Father   . Aneurysm Father   . Mental illness Sister   . Depression Sister   . Breast cancer Neg Hx     ALLERGIES:  is allergic to ampicillin; codeine; meperidine hcl; penicillins; propoxyphene hcl; sulfa antibiotics; and tramadol hcl.  MEDICATIONS:  Current  Outpatient Prescriptions  Medication Sig Dispense Refill  . aspirin 81 MG tablet Take 81 mg by mouth daily.    Marland Kitchen atorvastatin (LIPITOR) 40 MG tablet Take 1 tablet (40 mg total) by mouth daily at 6 PM. 90 tablet 3  . Biotin 5000 MCG CAPS Take 5,000 mcg by mouth daily.    . cholecalciferol (VITAMIN D) 1000 units tablet Take 1,000 Units by mouth daily.    . COMBIGAN 0.2-0.5 % ophthalmic solution Place 1 drop into both eyes 2 (two) times daily.  2  . Glucosamine HCl (GLUCOSAMINE PO) Take 1 tablet by mouth daily.    Marland Kitchen LUMIGAN 0.01 % SOLN Place 1 drop into both eyes at bedtime.    . Multiple Vitamin (MULTIVITAMIN WITH MINERALS) TABS  tablet Take 1 tablet by mouth daily.     Current Facility-Administered Medications  Medication Dose Route Frequency Provider Last Rate Last Dose  . 0.9 %  sodium chloride infusion  500 mL Intravenous Continuous [name], MD       Facility-Administered Medications Ordered in Other Visits  Medication Dose Route Frequency Provider Last Rate Last Dose  . clindamycin (CLEOCIN) 900 mg in dextrose 5 % 50 mL IVPB  900 mg Intravenous 60 min Pre-Op [name], MD       And  . gentamicin (GARAMYCIN) 340 mg in dextrose 5 % 50 mL IVPB  5 mg/kg Intravenous 60 min Pre-Op [name], MD        REVIEW OF SYSTEMS:  Constitutional: Denies fevers, chills or abnormal night sweats (+) low appetite  Eyes: Denies blurriness of vision, double vision or watery eyes Ears, nose, mouth, throat, and face: Denies mucositis or sore throat Respiratory: Denies cough, dyspnea or wheezes Cardiovascular: Denies palpitation, chest discomfort or lower extremity swelling Gastrointestinal:  Denies nausea, heartburn (+) constipation  Skin: Denies abnormal skin rashes Lymphatics: Denies new lymphadenopathy or easy bruising Neurological:Denies numbness, tingling or new weaknesses Behavioral/Psych: Mood is stable, no new changes  All other systems were reviewed with the patient and are  negative.  PHYSICAL EXAMINATION: ECOG PERFORMANCE STATUS: 1  Vitals:   10/06/16 1354  BP: (!) 168/65  Pulse: 60  Resp: 17  Temp: 98 F (36.7 C)  SpO2: 100%   Filed Weights   10/06/16 1354  Weight: 153 lb 14.4 oz (69.8 kg)    GENERAL:alert, no distress and comfortable SKIN: skin color, texture, turgor are normal, no rashes or significant lesions EYES: normal, conjunctiva are pink and non-injected, sclera clear OROPHARYNX:no exudate, no erythema and lips, buccal mucosa, and tongue normal  NECK: supple, thyroid normal size, non-tender, without nodularity LYMPH:  no palpable lymphadenopathy in the cervical, axillary or inguinal LUNGS: clear to auscultation and percussion with normal breathing effort HEART: regular rate & rhythm and no murmurs and no lower extremity edema ABDOMEN:abdomen soft, non-tender and normal bowel sounds. No organomegaly noted.  Musculoskeletal:no cyanosis of digits and no clubbing  PSYCH: alert & oriented x 3 with fluent speech NEURO: no focal motor/sensory deficits   LABORATORY DATA:  I have reviewed the data as listed CBC Latest Ref Rng & Units 09/23/2016 07/28/2016 05/07/2015  WBC 3.9 - 10.3 10e3/uL 8.4 6.7 7.3  Hemoglobin 11.6 - 15.9 g/dL 12.2 11.8(L) 12.8  Hematocrit 34.8 - 46.6 % 38.3 35.5(L) 37.9  Platelets 145 - 400 10e3/uL 310 293.0 235    CMP Latest Ref Rng & Units 09/23/2016 08/31/2016 07/28/2016  Glucose 70 - 140 mg/dl 94 - 96  BUN 7.0 - 26.0 mg/dL 15.2 12 19   Creatinine 0.6 - 1.1 mg/dL 0.8 0.83 0.88  Sodium 136 - 145 mEq/L 141 - 139  Potassium 3.5 - 5.1 mEq/L 4.1 - 4.2  Chloride 96 - 112 mEq/L - - 105  CO2 22 - 29 mEq/L 27 - 28  Calcium 8.4 - 10.4 mg/dL 9.3 - 9.4  Total Protein 6.4 - 8.3 g/dL 6.9 - 6.7  Total Bilirubin 0.20 - 1.20 mg/dL 0.70 - 0.6  Alkaline Phos 40 - 150 U/L 94 - 74  AST 5 - 34 U/L 22 - 18  ALT 0 - 55 U/L 20 - 19    CEA 08/31/16: 2.0   PATHOLOGY REPORT:   Colon biopsy, 08/31/16 Diagnosis 1. Surgical [P], hepatic  flexure - ADENOCARCINOMA. Moderately differentiated.  2. Surgical [P], transverse - ADENOCARCINOMA, Moderately differentiated.  RADIOGRAPHIC STUDIES: I have personally reviewed the radiological images as listed and agreed with the findings in the report. Mr Liver W Wo Contrast  Result Date: 09/16/2016 CLINICAL DATA:  Followup changes of possible metastatic disease. Colon cancer. EXAM: MRI ABDOMEN WITHOUT AND WITH CONTRAST TECHNIQUE: Multiplanar multisequence MR imaging of the abdomen was performed both before and after the administration of intravenous contrast. CONTRAST:  100mL MULTIHANCE GADOBENATE DIMEGLUMINE 529 MG/ML IV SOLN COMPARISON:  09/01/2016 FINDINGS: Lower chest: No acute findings. Hepatobiliary: Peripherally enhancing lesion within the right lobe of liver measures 13 mm is identified, image number 25 of series 903 and suspicious for liver metastases. There is no definite corresponding signal or enhancement abnormality within the caudate lobe to explain the lesion noted on CT. Within segment 6 of the liver there is a T1 hypointense lesion measuring 1.4 cm. This is mildly T2 hyperintense. There appears to be gradual peripheral enhancement associated with this structure suggesting a benign hemangioma. Pancreas: No mass, inflammatory changes, or other parenchymal abnormality identified. Spleen:  Within normal limits in size and appearance. Adrenals/Urinary Tract: Normal adrenal glands. Small bilateral kidney cysts. No solid enhancing masses or hydronephrosis Stomach/Bowel: No abnormal dilatation of the stomach and upper abdominal bowel loops. Mass involving the proximal transverse colon is again identified, image 30 of series 7. This measures approximately 3.5 x 2.8 cm. Vascular/Lymphatic: Aortic atherosclerosis. No aneurysm. No upper abdominal adenopathy. Other:  No ascites or focal fluid collections identified. Musculoskeletal: No suspicious bone lesions identified. IMPRESSION: 1. Lesion within  central right lobe of liver exhibits of peripheral enhancement and is suspicious for liver metastasis. Lesion within caudate lobe of liver identified on recent CT does not have a corresponding signal or enhancement abnormality on today's study. Within the inferior right lobe of liver there is an enhancing structure which is favored to represent an atypical benign hemangioma with liver metastasis felt less likely. Consider further staging with PET-CT. 2. The transverse colon lesion is again identified compatible with colonic adenocarcinoma 3.  Aortic Atherosclerosis (ICD10-I70.0). Electronically Signed   By: Kerby Moors M.D.   On: 09/16/2016 08:45   Nm Pet Image Initial (pi) Skull Base To Thigh  Result Date: 10/04/2016 CLINICAL DATA:  Initial treatment strategy for metastatic colon cancer. EXAM: NUCLEAR MEDICINE PET SKULL BASE TO THIGH TECHNIQUE: 7.7 mCi F-18 FDG was injected intravenously. Full-ring PET imaging was performed from the skull base to thigh after the radiotracer. CT data was obtained and used for attenuation correction and anatomic localization. FASTING BLOOD GLUCOSE:  Value: 104 mg/dl COMPARISON:  Multiple exams, including 09/15/2016 MRI FINDINGS: NECK No hypermetabolic lymph nodes in the neck. CHEST No hypermetabolic mediastinal or hilar nodes. The previously seen nodules on prior chest CT are again observed and in the 3-5 mm range, chronically stable, and not hypermetabolic. Noted nodules in this size range are not assessed in a sensitive fashion by PET-CT. Coronary, aortic arch, and branch vessel atherosclerotic vascular disease. ABDOMEN/PELVIS In the vicinity of the peripherally enhancing right hepatic lobe lesion shown on MRI near the dome of the right hepatic lobe, there is no appreciable hypermetabolic activity separate from that of the liver. Of note, the lesion on MRI demonstrates only thin peripheral enhancement of about 3 mm. The lesion in segment 6 of the liver is hypodense but not  hypermetabolic. The enhancing characteristics of the segment 6 lesion or more characteristic of hemangioma on MRI. Just below the caudate lobe and in the  portacaval region, a hypermetabolic lymph node measuring 1.5 cm in short axis on image 95 of series 4 has a maximum standard uptake value of 16.7, compatible with malignancy. Near the duodenal bulb there is a second focus of mild hypermetabolic activity with maximum SUV of 7.6, which could be due to another lymph node or some physiologic activity in the duodenal bulb. The area of the original questionable caudate lobe lesion on CT demonstrates no definite hypermetabolic activity. A mass in the proximal transverse colon is present with maximum SUV 30.8, compatible with malignancy. Adjacent faint foci of hypermetabolic activity just cephalad to this mass noted, likely from local nodal involvement. A small indistinct focus of activity in the adjacent mesentery has a maximum SUV of 4.2. In the vicinity of the described ascending colon mass, there is hypermetabolic activity with maximum SUV 8.7. Aortoiliac atherosclerotic vascular disease. Bilateral nonobstructive nephrolithiasis. SKELETON No focal hypermetabolic activity to suggest skeletal metastasis. IMPRESSION: 1. Highly hypermetabolic proximal transverse colon mass, maximum SUV 30.8. Small focus of hypermetabolic activity in the mesentery just above this mass probably represents a local involved lymph node. 2. Moderately hypermetabolic ascending colon mass, maximum SUV 8.7. 3. Highly hypermetabolic enlarged portacaval lymph node, maximum SUV 16.7. 4. Focus of hypermetabolic activity near the duodenum bulb could be physiologic or due to an adjacent lymph node. 5. None of the liver lesions are discernibly hypermetabolic. Particularly the more cephalad right hepatic lobe lesion merits surveillance, it had nonspecific enhancement characteristics on prior cross-sectional imaging and only a thin enhancing wall which  could conceivably predispose to false negative due to central necrosis. 6. Several tiny chronic pulmonary nodules are likely benign. 7. Other imaging findings of potential clinical significance: Aortic Atherosclerosis (ICD10-I70.0). Coronary atherosclerosis. Bilateral nonobstructive nephrolithiasis. Electronically Signed   By: Van Clines M.D.   On: 10/04/2016 10:54    PROCEDURES   COLONOSCOPY 08/31/2016 DR. JACOBS  - 1) Polypoid mass at the hepatic flexure, 2cm, soft but clearly neoplastic. This was biopsied and the site was injected with Spot. - 2) Clearly malignant mass in the distal transverse colon; 4cm, firm, 1/2 cirumference, ulcerated. This wsa biopsied and the site was also injected with Spot. - 3) Four small (3-12mm) scattered typical appearing adenomas throughout the colon, not removed since she mistakenly stayed on her plavix for this procedure.  ASSESSMENT & PLAN:   Fendi Meinhardt, is a 76 y.o. caucasian female with colorectal adenocarcinoma, moderately differentiated, found on screening colonoscopy.   1. Synchronized colon adenocarcinoma, moderately differentiated, in hepatic fracture and distal transverse colon, [id number] -I have reviewed her colonoscopy, biopsy and CT and MRI image findings with patient and her friend in great details. -She has to synchronized colon mass in the transverse colon, post biopsy showed moderately-differentiated adenocarcinoma -CT and abdominal MRI showed 2 lesions in right lobe of liver, at least one lesion is very suspicious for metastatic disease. --We discussed 10/04/16 PET. There is no definitive distant metastasis on the PET scan. There was no uptake in her liver, liver metastasis is less likely, although not resulted. I'll speak with IR to see if we still need to proceed with liver biopsy, which is scheduled for this Friday. If we decided not to pursue liver biopsy, [name] may able to inspect her liver during the surgery to see if  biopsy is necessary.  -If liver biopsy is not needed or comes back negative She will proceed with surgery. If biopsy comes back positive she will return to clinic sooner.  -we  reviewed the role of adjuvant chemo, if T4 or node positive disease. She is elderly, but overall in good health, I may still consider adjuvant chemotherapy if she has high risk disease. -Patient had a question about the role of stem cel therapy and radiation, I asked her to my best knowledge.   2. Anemia -07/28/16 hemoglobin level 11.8 -Will check iron levels today -Advised to began taking iron pill  3. History of stroke  -No residual neuro deficits. She functions well at home. -Her aspirin and Plavix has been held since her colonoscopy, we'll continue holding them for now, and resume after liver biopsy.  4. ? HTN -her BP high in clinic previously, pt states it's been normal at home -will monitor  5. Constipation  -Secondary to tumors  -I suggest she takes stool softer to help her  Plan: -PET reviewed with pt today, I will discuss with IR to see if we still need proceed with liver biopsy  -F/u open based on surgery and biopsy    No orders of the defined types were placed in this encounter.   All questions were answered. The patient knows to call the clinic with any problems, questions or concerns. I spent 20 minutes counseling the patient face to face. The total time spent in the appointment was 30 minutes and more than 50% was on counseling.  This document serves as a record of services personally performed by [name], MD. It was created on her behalf by Joslyn Devon, a trained medical scribe. The creation of this record is based on the scribe's personal observations and the provider's statements to them. This document has been checked and approved by the attending provider.     [name], MD 10/06/2016 10:46 PM

## 2016-10-06 ENCOUNTER — Ambulatory Visit (HOSPITAL_BASED_OUTPATIENT_CLINIC_OR_DEPARTMENT_OTHER): Payer: Medicare HMO | Admitting: Hematology

## 2016-10-06 ENCOUNTER — Other Ambulatory Visit: Payer: Self-pay | Admitting: Family Medicine

## 2016-10-06 ENCOUNTER — Encounter: Payer: Self-pay | Admitting: Hematology

## 2016-10-06 VITALS — BP 168/65 | HR 60 | Temp 98.0°F | Resp 17 | Ht 64.0 in | Wt 153.9 lb

## 2016-10-06 DIAGNOSIS — E559 Vitamin D deficiency, unspecified: Secondary | ICD-10-CM

## 2016-10-06 DIAGNOSIS — Z8673 Personal history of transient ischemic attack (TIA), and cerebral infarction without residual deficits: Secondary | ICD-10-CM | POA: Diagnosis not present

## 2016-10-06 DIAGNOSIS — K5909 Other constipation: Secondary | ICD-10-CM

## 2016-10-06 DIAGNOSIS — B009 Herpesviral infection, unspecified: Secondary | ICD-10-CM

## 2016-10-06 DIAGNOSIS — I10 Essential (primary) hypertension: Secondary | ICD-10-CM

## 2016-10-06 DIAGNOSIS — C188 Malignant neoplasm of overlapping sites of colon: Secondary | ICD-10-CM | POA: Diagnosis not present

## 2016-10-06 DIAGNOSIS — D649 Anemia, unspecified: Secondary | ICD-10-CM | POA: Diagnosis not present

## 2016-10-07 ENCOUNTER — Other Ambulatory Visit: Payer: Self-pay | Admitting: Radiology

## 2016-10-07 ENCOUNTER — Telehealth: Payer: Self-pay | Admitting: *Deleted

## 2016-10-07 NOTE — Telephone Encounter (Signed)
Not on med list and given recent cancer diagnosis, will rout to PCP for approval/ denial

## 2016-10-07 NOTE — Telephone Encounter (Signed)
Called pt and left message on voice mail re:  Dr. Burr Medico spoke with IR radiologist.  Pt does not need biopsy.   Dr. Burr Medico will speak to [name] for next plan of care.   Biopsy cancelled as per md's instructions.

## 2016-10-07 NOTE — Telephone Encounter (Signed)
Will refill electronically  

## 2016-10-08 ENCOUNTER — Ambulatory Visit (HOSPITAL_COMMUNITY): Payer: Medicare HMO

## 2016-10-08 ENCOUNTER — Ambulatory Visit (HOSPITAL_COMMUNITY): Admission: RE | Admit: 2016-10-08 | Payer: Medicare HMO | Source: Ambulatory Visit

## 2016-10-08 ENCOUNTER — Encounter (HOSPITAL_COMMUNITY): Payer: Self-pay

## 2016-11-01 ENCOUNTER — Telehealth: Payer: Self-pay | Admitting: *Deleted

## 2016-11-01 NOTE — Telephone Encounter (Signed)
She is going to have a colectomy for colon cancer upcoming  Px in IN box

## 2016-11-01 NOTE — Telephone Encounter (Signed)
Patient left a voicemail requesting that a script for a portable potty be sent to [facility]. Patient stated that she knows after her surgery 11/10/16 she will have to move into the extra bedroom and will need this.

## 2016-11-04 NOTE — Telephone Encounter (Signed)
Pt requested that I fax this to her insurance, she gave me [employer]'s phone #, I called them and they gave me a fax # of [phone], I faxed the order to them

## 2016-11-05 NOTE — Pre-Procedure Instructions (Addendum)
[name]  11/05/2016      [id number] - [place], [place] [address] [place] [address] Phone: [phone] Fax: [phone]  [facility]) [address] [place], [address] [place] [address] Phone: [phone] Fax: [phone]    Your procedure is scheduled on September 19  Report to [facility] at [place].M.  Call this number if you have problems the morning of surgery:  [phone]   Remember:   On the day before your prep please drink clear liquids to prevent dehydration   Do not eat food or drink liquids after midnight.  Please complete your 8oz of Boost Breeze or Water that was given to you at your preadmission appointment by 0430am on the day of your surgery   Continue all other medications as directed by your physician except follow these medication instructions before surgery   Take these medicines the morning of surgery with A SIP OF WATER  Eye drops if needed  7 days prior to surgery  taking any Aspirin, Aleve, Naproxen, Ibuprofen, Motrin, Advil, Goody's, BC's, all herbal medications, fish oil, and all vitamins  Follow your doctors instructions regarding your Aspirin.  If no instructions were given by the doctor you will need to call the office to get instructions.  Your pre admission RN will also call for those instructions    Do not wear jewelry, make-up or nail polish.  Do not wear lotions, powders, or perfumes, or deoderant.  Do not shave 48 hours prior to surgery.  Men may shave face and neck.  Do not bring valuables to the hospital.  [facility] is not responsible for any belongings or valuables.  Contacts, dentures or bridgework may not be worn into surgery.  Leave your suitcase in the car.  After surgery it may be brought to your room.  For patients admitted to the hospital, discharge time will be determined by your  treatment team.  Patients discharged the day of surgery will not be allowed to drive home.    Special instructions:   [facility]- Preparing For Surgery  Before surgery, you can play an important role. Because skin is not sterile, your skin needs to be as free of germs as possible. You can reduce the number of germs on your skin by washing with CHG (chlorahexidine gluconate) Soap before surgery.  CHG is an antiseptic cleaner which kills germs and bonds with the skin to continue killing germs even after washing.  Please do not use if you have an allergy to CHG or antibacterial soaps. If your skin becomes reddened/irritated stop using the CHG.  Do not shave (including legs and underarms) for at least 48 hours prior to first CHG shower. It is OK to shave your face.  Please follow these instructions carefully.   1. Shower the NIGHT BEFORE SURGERY and the MORNING OF SURGERY with CHG.   2. If you chose to wash your hair, wash your hair first as usual with your normal shampoo.  3. After you shampoo, rinse your hair and body thoroughly to remove the shampoo.  4. Use CHG as you would any other liquid soap. You can apply CHG directly to the skin and wash gently with a scrungie or a clean washcloth.   5. Apply the CHG Soap to your body ONLY FROM THE NECK DOWN.  Do not use on open wounds or open sores. Avoid contact with  your eyes, ears, mouth and genitals (private parts). Wash genitals (private parts) with your normal soap.  6. Wash thoroughly, paying special attention to the area where your surgery will be performed.  7. Thoroughly rinse your body with warm water from the neck down.  8. DO NOT shower/wash with your normal soap after using and rinsing off the CHG Soap.  9. Pat yourself dry with a CLEAN TOWEL.   10. Wear CLEAN PAJAMAS   11. Place CLEAN SHEETS on your bed the night of your first shower and DO NOT SLEEP WITH PETS.    Day of Surgery: Do not apply any deodorants/lotions.  Please wear clean clothes to the hospital/surgery center.      Please read over the following fact sheets that you were given.

## 2016-11-08 ENCOUNTER — Encounter (HOSPITAL_COMMUNITY)
Admission: RE | Admit: 2016-11-08 | Discharge: 2016-11-08 | Disposition: A | Discharge status: Home / Self Care | Payer: Medicare HMO | Source: Ambulatory Visit | Attending: General Surgery | Admitting: General Surgery

## 2016-11-08 ENCOUNTER — Encounter (HOSPITAL_COMMUNITY): Payer: Self-pay

## 2016-11-08 ENCOUNTER — Other Ambulatory Visit: Payer: Self-pay

## 2016-11-08 DIAGNOSIS — K9189 Other postprocedural complications and disorders of digestive system: Secondary | ICD-10-CM | POA: Diagnosis not present

## 2016-11-08 DIAGNOSIS — Z90711 Acquired absence of uterus with remaining cervical stump: Secondary | ICD-10-CM | POA: Diagnosis not present

## 2016-11-08 DIAGNOSIS — C19 Malignant neoplasm of rectosigmoid junction: Secondary | ICD-10-CM | POA: Diagnosis not present

## 2016-11-08 DIAGNOSIS — Z86010 Personal history of colonic polyps: Secondary | ICD-10-CM | POA: Diagnosis not present

## 2016-11-08 DIAGNOSIS — E46 Unspecified protein-calorie malnutrition: Secondary | ICD-10-CM | POA: Diagnosis not present

## 2016-11-08 DIAGNOSIS — Z8673 Personal history of transient ischemic attack (TIA), and cerebral infarction without residual deficits: Secondary | ICD-10-CM | POA: Diagnosis not present

## 2016-11-08 DIAGNOSIS — K567 Ileus, unspecified: Secondary | ICD-10-CM | POA: Diagnosis not present

## 2016-11-08 DIAGNOSIS — Z7902 Long term (current) use of antithrombotics/antiplatelets: Secondary | ICD-10-CM | POA: Diagnosis not present

## 2016-11-08 DIAGNOSIS — C184 Malignant neoplasm of transverse colon: Secondary | ICD-10-CM | POA: Diagnosis not present

## 2016-11-08 DIAGNOSIS — Z9889 Other specified postprocedural states: Secondary | ICD-10-CM | POA: Diagnosis not present

## 2016-11-08 HISTORY — DX: Cardiac arrhythmia, unspecified: I49.9

## 2016-11-08 HISTORY — DX: Malignant (primary) neoplasm, unspecified: C80.1

## 2016-11-08 HISTORY — DX: Dyspnea, unspecified: R06.00

## 2016-11-08 HISTORY — DX: Nausea with vomiting, unspecified: R11.2

## 2016-11-08 HISTORY — DX: Anemia, unspecified: D64.9

## 2016-11-08 HISTORY — DX: Unspecified osteoarthritis, unspecified site: M19.90

## 2016-11-08 HISTORY — DX: Other specified postprocedural states: Z98.890

## 2016-11-08 LAB — CBC WITH DIFFERENTIAL/PLATELET
Basophils Absolute: 0 10*3/uL (ref 0.0–0.1)
Basophils Relative: 0 %
Eosinophils Absolute: 0.2 10*3/uL (ref 0.0–0.7)
Eosinophils Relative: 1 %
HCT: 38.5 % (ref 36.0–46.0)
Hemoglobin: 12.3 g/dL (ref 12.0–15.0)
Lymphocytes Relative: 20 %
Lymphs Abs: 2.2 10*3/uL (ref 0.7–4.0)
MCH: 27.5 pg (ref 26.0–34.0)
MCHC: 31.9 g/dL (ref 30.0–36.0)
MCV: 86.1 fL (ref 78.0–100.0)
Monocytes Absolute: 1 10*3/uL (ref 0.1–1.0)
Monocytes Relative: 10 %
Neutro Abs: 7.5 10*3/uL (ref 1.7–7.7)
Neutrophils Relative %: 69 %
Platelets: 345 10*3/uL (ref 150–400)
RBC: 4.47 MIL/uL (ref 3.87–5.11)
RDW: 13.5 % (ref 11.5–15.5)
WBC: 11 10*3/uL — ABNORMAL HIGH (ref 4.0–10.5)

## 2016-11-08 LAB — URINALYSIS, ROUTINE W REFLEX MICROSCOPIC
Bilirubin Urine: NEGATIVE
Glucose, UA: NEGATIVE mg/dL
Hgb urine dipstick: NEGATIVE
Ketones, ur: NEGATIVE mg/dL
Leukocytes, UA: NEGATIVE
Nitrite: NEGATIVE
Protein, ur: NEGATIVE mg/dL
Specific Gravity, Urine: 1.019 (ref 1.005–1.030)
pH: 5 (ref 5.0–8.0)

## 2016-11-08 LAB — COMPREHENSIVE METABOLIC PANEL
ALT: 19 U/L (ref 14–54)
AST: 20 U/L (ref 15–41)
Albumin: 3.7 g/dL (ref 3.5–5.0)
Alkaline Phosphatase: 82 U/L (ref 38–126)
Anion gap: 7 (ref 5–15)
BUN: 24 mg/dL — ABNORMAL HIGH (ref 6–20)
CO2: 26 mmol/L (ref 22–32)
Calcium: 9.1 mg/dL (ref 8.9–10.3)
Chloride: 104 mmol/L (ref 101–111)
Creatinine, Ser: 0.96 mg/dL (ref 0.44–1.00)
GFR calc Af Amer: >60 mL/min (ref >60)
GFR calc non Af Amer: 55 mL/min — ABNORMAL LOW (ref >60)
Glucose, Bld: 99 mg/dL (ref 65–99)
Potassium: 3.8 mmol/L (ref 3.5–5.1)
Sodium: 137 mmol/L (ref 135–145)
Total Bilirubin: 0.5 mg/dL (ref 0.3–1.2)
Total Protein: 7.4 g/dL (ref 6.5–8.1)

## 2016-11-08 LAB — TYPE AND SCREEN
ABO/RH(D): A NEG
Antibody Screen: NEGATIVE

## 2016-11-08 LAB — ABO/RH: ABO/RH(D): A NEG

## 2016-11-08 LAB — HEMOGLOBIN A1C
Hgb A1c MFr Bld: 6.3 % — ABNORMAL HIGH (ref 4.8–5.6)
Mean Plasma Glucose: 134.11 mg/dL

## 2016-11-08 LAB — PROTIME-INR
INR: 0.95
Prothrombin Time: 12.6 seconds (ref 11.4–15.2)

## 2016-11-08 NOTE — H&P (Signed)
[name] Location: [facility] Patient #: [id number] DOB: [birth year] Married / Language: English / Race: White Female   History of Present Illness The patient is a 76 year old female who presents with colorectal cancer. Pt is a 76 yo F referred by [name] for a new dx of colon cancer. She presented with rectal bleeding (last year) and increasing constipation this year. She underwent colonoscopy and was seen to have two cancers. She is on Plavix for h/o CVA. She has been having increasing difficulty having BMs. She denies abdominal pain. She works with horses and keeps 9 of them. She denies weight loss. She has had a bit of dizziness. She denies continued bleeding from stools.   After colonoscopy, she had CT showing concerning lesions in liver. MRI showed one to be benign, but one looked still c/w metastasis. She saw Dr. Burr Medico yesterday and PET is getting set up. If PET negative, would get IR perc bx.    colonoscopy 08/31/2016  1) Polypoid mass at the hepatic flexure, 2cm, soft but clearly neoplastic. This was biopsied and the site was injected with Spot. - 2) Clearly malignant mass in the distal transverse colon; 4cm, firm, 1/2 cirumference, ulcerated. This wsa biopsied and the site was also injected with Spot. - 3) Four small (3-58mm) scattered typical appearing adenomas throughout the colon, not removed since she mistakenly stayed on her plavix for this procedure.  pathology 08/31/2016 Diagnosis 1. Surgical [P], hepatic flexure - ADENOCARCINOMA. - SEE MICROSCOPIC DESCRIPTION 2. Surgical [P], transverse - ADENOCARCINOMA.  MR liver 09/16/16 IMPRESSION: 1. Lesion within central right lobe of liver exhibits of peripheral enhancement and is suspicious for liver metastasis. Lesion within caudate lobe of liver identified on recent CT does not have a corresponding signal or enhancement abnormality on today's study. Within the inferior right lobe of liver  there is an enhancing structure which is favored to represent an atypical benign hemangioma with liver metastasis felt less likely. Consider further staging with PET-CT. 2. The transverse colon lesion is again identified compatible with colonic adenocarcinoma 3. Aortic Atherosclerosis (ICD10-I70.0).  CT chest 09/06/2016 : 1. No CT findings to suggest metastatic disease in the chest. 2. Multiple right lung nodules are identified, but these were present on a previous CT from 6 years ago and are unchanged, consistent with benign etiology. 3. 12 mm low-density lesion in the right liver, incompletely visualized and better characterized on the recent abdomen and pelvis CT.  CT abd 09/01/2016 IMPRESSION: 1. There are 2 colonic lesions involving the proximal ascending colon and proximal transverse colon, as detailed above, highly concerning for colonic neoplasm. In addition, there are at least 2 indeterminate liver lesions which are suspicious for potential metastatic disease. Further evaluation with nonemergent abdominal MRI with and without IV gadolinium is strongly recommended in the near future to better characterize these lesions. 2. Aortic atherosclerosis. 3. Additional incidental findings, as above.  CEA 2.0 CMET nl 07/28/16 CBC nl x HCT 35.5 07/28/2016   Past Surgical History  Colon Polyp Removal - Colonoscopy  Gallbladder Surgery - Laparoscopic  Hysterectomy (not due to cancer) - Partial  Shoulder Surgery  Bilateral.  Diagnostic Studies History Colonoscopy  within last year Mammogram  within last year  Medication History Aspirin (81MG  Tablet, Oral) Active. Vitamin D (1000UNIT Tablet, Oral) Active. Combigan (0.2-0.5% Solution, Ophthalmic) Active. Lumigan (0.01% Solution, Ophthalmic) Active. Multiple Vitamin (Oral) Active. Atorvastatin Calcium (40MG  Tablet, Oral) Active. Clopidogrel Bisulfate (75MG  Tablet, Oral) Active. ((plavix)) ValACYclovir HCl (500MG   Tablet,  Oral) Active. Neomycin Sulfate (500MG  Tablet, 2 (two) Tablet Oral SEE NOTE, Taken starting 09/21/2016) Active. (TAKE TWO TABLETS AT 2 PM, 3 PM, AND 10 PM THE DAY PRIOR TO SURGERY) Flagyl (500MG  Tablet, 2 (two) Tablet Oral SEE NOTE, Taken starting 09/21/2016) Active. (Take at 2pm, 3pm, and 10pm the day prior to your colon operation) Medications Reconciled  Social History  Alcohol use  Occasional alcohol use. Caffeine use  Carbonated beverages, Tea. No drug use  Tobacco use  Never smoker.  Family History  Alcohol Abuse  Brother, Father, Mother. Arthritis  Father. Cerebrovascular Accident  Mother. Depression  Mother. Heart Disease  Brother. Hypertension  Brother, Mother. Migraine Headache  Daughter.  Pregnancy / Birth History Age at menarche  22 years. Age of menopause  >58 Contraceptive History  Contraceptive implant, Oral contraceptives. Gravida  4 Length (months) of breastfeeding  3-6 Maternal age  23-20 Para  4 Regular periods   Other Problems Back Pain  Bladder Problems  Cerebrovascular Accident  Cholelithiasis  Colon Cancer  Gastroesophageal Reflux Disease  Hemorrhoids  High blood pressure  Hypercholesterolemia  Lump In Breast  Oophorectomy     Review of Systems General Present- Appetite Loss, Fatigue, Night Sweats and Weight Gain. Not Present- Chills, Fever and Weight Loss. Skin Not Present- Change in Wart/Mole, Dryness, Hives, Jaundice, New Lesions, Non-Healing Wounds, Rash and Ulcer. HEENT Present- Hoarseness, Sore Throat, Visual Disturbances and Wears glasses/contact lenses. Not Present- Earache, Hearing Loss, Nose Bleed, Oral Ulcers, Ringing in the Ears, Seasonal Allergies, Sinus Pain and Yellow Eyes. Respiratory Not Present- Bloody sputum, Chronic Cough, Difficulty Breathing, Snoring and Wheezing. Breast Not Present- Breast Mass, Breast Pain, Nipple Discharge and Skin Changes. Cardiovascular Present- Leg Cramps. Not  Present- Chest Pain, Difficulty Breathing Lying Down, Palpitations, Rapid Heart Rate, Shortness of Breath and Swelling of Extremities. Gastrointestinal Present- Abdominal Pain, Bloating, Constipation, Excessive gas, Gets full quickly at meals and Hemorrhoids. Not Present- Bloody Stool, Change in Bowel Habits, Chronic diarrhea, Difficulty Swallowing, Indigestion, Nausea, Rectal Pain and Vomiting. Female Genitourinary Present- Nocturia and Urgency. Not Present- Frequency, Painful Urination and Pelvic Pain. Musculoskeletal Present- Joint Stiffness. Not Present- Back Pain, Joint Pain, Muscle Pain, Muscle Weakness and Swelling of Extremities. Neurological Not Present- Decreased Memory, Fainting, Headaches, Numbness, Seizures, Tingling, Tremor, Trouble walking and Weakness. Psychiatric Present- Change in Sleep Pattern. Not Present- Anxiety, Bipolar, Depression, Fearful and Frequent crying. Endocrine Present- Cold Intolerance and Hair Changes. Not Present- Excessive Hunger, Heat Intolerance, Hot flashes and New Diabetes. Hematology Present- Blood Thinners and Easy Bruising. Not Present- Excessive bleeding, Gland problems, HIV and Persistent Infections.  Vitals Weight: 152.6 lb Height: 64in Body Surface Area: 1.74 m Body Mass Index: 26.19 kg/m  Temp.: 97.54F  Pulse: 79 (Regular)  BP: 120/68 (Sitting, Left Arm, Standard)       Physical Exam  General Mental Status-Alert. General Appearance-Consistent with stated age. Hydration-Well hydrated. Voice-Normal.  Integumentary Note: small petichiae on skin.   Head and Neck Head-normocephalic, atraumatic with no lesions or palpable masses. Trachea-midline. Thyroid Gland Characteristics - normal size and consistency.  Eye Eyeball - Bilateral-Extraocular movements intact. Sclera/Conjunctiva - Bilateral-No scleral icterus.  Chest and Lung Exam Chest and lung exam reveals -quiet, even and easy respiratory effort  with no use of accessory muscles and on auscultation, normal breath sounds, no adventitious sounds and normal vocal resonance. Inspection Chest Wall - Normal. Back - normal.  Cardiovascular Cardiovascular examination reveals -normal heart sounds, regular rate and rhythm with no murmurs and normal pedal pulses bilaterally.  Abdomen  Inspection Inspection of the abdomen reveals - No Hernias. Palpation/Percussion Palpation and Percussion of the abdomen reveal - Soft, Non Tender, No Rebound tenderness, No Rigidity (guarding) and No hepatosplenomegaly. Auscultation Auscultation of the abdomen reveals - Bowel sounds normal.  Neurologic Neurologic evaluation reveals -alert and oriented x 3 with no impairment of recent or remote memory. Mental Status-Normal.  Musculoskeletal Global Assessment -Note: no gross deformities.  Normal Exam - Left-Upper Extremity Strength Normal and Lower Extremity Strength Normal. Normal Exam - Right-Upper Extremity Strength Normal and Lower Extremity Strength Normal.  Lymphatic Head & Neck  General Head & Neck Lymphatics: Bilateral - Description - Normal. Axillary  General Axillary Region: Bilateral - Description - Normal. Tenderness - Non Tender. Femoral & Inguinal  Generalized Femoral & Inguinal Lymphatics: Bilateral - Description - No Generalized lymphadenopathy.    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 She getting her PET scan. She may need a biopsy of her liver at that time.  I had a long talk with her and her friend regarding the pros and cons of surgery up front versus chemotherapy upfront. The patient was advised that chemotherapy treats the entire body as metastatic disease is generally the life limiting factor in colon cancer. However, she is having increasing constipation  and may benefit from upfront surgery. I reviewed a partial colectomy with the patient. I suspect that she will need an extended right hemicolectomy, but it depends on exactly where the tumors are located as to how much of her colon would need to be removed. I discussed also the risks of port placement. I advised the patient that if she has lymph node positive disease or metastatic disease that she would be recommended to receive chemotherapy. I discussed risk of colon week, ostomy, bleeding, infection, prolonged recovery, possible cancer recurrence, pneumonia, other heart or lung complications, blood clots, death. Once we get the results from the PET scan, I will communicate with Dr. Burr Medico what direction we will proceed in. Patient does have limited potentially metastatic disease, so I do not think it would be wrong to do colectomy first since the patient is symptomatic.  I have communicated with [name] and it is OK to hold plavix.  45 min spent in evaluation, examination, counseling, and coordination of care. >50% spent in counseling. Current Plans Pt Education - flb right colectomy: discussed with patient and provided information. Pt Education - [facility] Colon Bowel Prep 2015 Miralax/Antibiotics Started Neomycin Sulfate 500MG , 2 (two) Tablet SEE NOTE, #6, 09/21/2016, No Refill. Local Order: TAKE TWO TABLETS AT 2 PM, 3 PM, AND 10 PM THE DAY PRIOR TO SURGERY Started Flagyl 500MG , 2 (two) Tablet SEE NOTE, #6, 09/21/2016, No Refill. Local Order: Take at 2pm, 3pm, and 10pm the day prior to your colon operation Pt Education - [facility] port insertion education   Signed by [name], MD

## 2016-11-08 NOTE — Progress Notes (Signed)
PCP is Dr. Thea Alken  LOV 07/2016 She states that "yrs ago, had some skipped beats" She also states she had TIA this past yr, hospitalized 3/14-3/15 and it resolved in the hospital.  Echo & carotid doppler done. She has her instructions from [facility] concerning the bowel prep and understands them. Eras juice will be consumed at 0430 DOS.

## 2016-11-10 ENCOUNTER — Encounter (HOSPITAL_COMMUNITY): Payer: Self-pay | Admitting: Urology

## 2016-11-10 ENCOUNTER — Encounter (HOSPITAL_COMMUNITY)
Admission: RE | Disposition: A | Discharge status: Home / Self Care | Payer: Self-pay | Source: Ambulatory Visit | Attending: General Surgery

## 2016-11-10 ENCOUNTER — Inpatient Hospital Stay (HOSPITAL_COMMUNITY): Payer: Medicare HMO | Admitting: Certified Registered"

## 2016-11-10 ENCOUNTER — Inpatient Hospital Stay (HOSPITAL_COMMUNITY)
Admission: RE | Admit: 2016-11-10 | Discharge: 2016-11-22 | DRG: 330 | Disposition: A | Discharge status: Home / Self Care | Payer: Medicare HMO | Source: Ambulatory Visit | Attending: General Surgery | Admitting: General Surgery

## 2016-11-10 DIAGNOSIS — D123 Benign neoplasm of transverse colon: Secondary | ICD-10-CM | POA: Diagnosis not present

## 2016-11-10 DIAGNOSIS — Z79899 Other long term (current) drug therapy: Secondary | ICD-10-CM | POA: Diagnosis not present

## 2016-11-10 DIAGNOSIS — Z7902 Long term (current) use of antithrombotics/antiplatelets: Secondary | ICD-10-CM | POA: Diagnosis not present

## 2016-11-10 DIAGNOSIS — C19 Malignant neoplasm of rectosigmoid junction: Secondary | ICD-10-CM | POA: Diagnosis not present

## 2016-11-10 DIAGNOSIS — Z8673 Personal history of transient ischemic attack (TIA), and cerebral infarction without residual deficits: Secondary | ICD-10-CM | POA: Diagnosis not present

## 2016-11-10 DIAGNOSIS — R160 Hepatomegaly, not elsewhere classified: Secondary | ICD-10-CM | POA: Diagnosis present

## 2016-11-10 DIAGNOSIS — K219 Gastro-esophageal reflux disease without esophagitis: Secondary | ICD-10-CM | POA: Diagnosis present

## 2016-11-10 DIAGNOSIS — E46 Unspecified protein-calorie malnutrition: Secondary | ICD-10-CM | POA: Diagnosis present

## 2016-11-10 DIAGNOSIS — Z818 Family history of other mental and behavioral disorders: Secondary | ICD-10-CM

## 2016-11-10 DIAGNOSIS — Z811 Family history of alcohol abuse and dependence: Secondary | ICD-10-CM | POA: Diagnosis not present

## 2016-11-10 DIAGNOSIS — Z7982 Long term (current) use of aspirin: Secondary | ICD-10-CM | POA: Diagnosis not present

## 2016-11-10 DIAGNOSIS — K567 Ileus, unspecified: Secondary | ICD-10-CM | POA: Diagnosis not present

## 2016-11-10 DIAGNOSIS — R251 Tremor, unspecified: Secondary | ICD-10-CM | POA: Diagnosis present

## 2016-11-10 DIAGNOSIS — Z8261 Family history of arthritis: Secondary | ICD-10-CM

## 2016-11-10 DIAGNOSIS — C182 Malignant neoplasm of ascending colon: Secondary | ICD-10-CM | POA: Diagnosis not present

## 2016-11-10 DIAGNOSIS — Z823 Family history of stroke: Secondary | ICD-10-CM | POA: Diagnosis not present

## 2016-11-10 DIAGNOSIS — D122 Benign neoplasm of ascending colon: Secondary | ICD-10-CM | POA: Diagnosis present

## 2016-11-10 DIAGNOSIS — Z86010 Personal history of colonic polyps: Secondary | ICD-10-CM | POA: Diagnosis not present

## 2016-11-10 DIAGNOSIS — C772 Secondary and unspecified malignant neoplasm of intra-abdominal lymph nodes: Secondary | ICD-10-CM | POA: Diagnosis not present

## 2016-11-10 DIAGNOSIS — I2510 Atherosclerotic heart disease of native coronary artery without angina pectoris: Secondary | ICD-10-CM | POA: Diagnosis present

## 2016-11-10 DIAGNOSIS — E7800 Pure hypercholesterolemia, unspecified: Secondary | ICD-10-CM | POA: Diagnosis present

## 2016-11-10 DIAGNOSIS — Z8249 Family history of ischemic heart disease and other diseases of the circulatory system: Secondary | ICD-10-CM | POA: Diagnosis not present

## 2016-11-10 DIAGNOSIS — Z90711 Acquired absence of uterus with remaining cervical stump: Secondary | ICD-10-CM

## 2016-11-10 DIAGNOSIS — Z4682 Encounter for fitting and adjustment of non-vascular catheter: Secondary | ICD-10-CM | POA: Diagnosis not present

## 2016-11-10 DIAGNOSIS — Z9889 Other specified postprocedural states: Secondary | ICD-10-CM | POA: Diagnosis not present

## 2016-11-10 DIAGNOSIS — I10 Essential (primary) hypertension: Secondary | ICD-10-CM | POA: Diagnosis present

## 2016-11-10 DIAGNOSIS — K9189 Other postprocedural complications and disorders of digestive system: Secondary | ICD-10-CM | POA: Diagnosis not present

## 2016-11-10 DIAGNOSIS — C184 Malignant neoplasm of transverse colon: Secondary | ICD-10-CM | POA: Diagnosis present

## 2016-11-10 DIAGNOSIS — R8589 Other abnormal findings in specimens from digestive organs and abdominal cavity: Secondary | ICD-10-CM | POA: Diagnosis not present

## 2016-11-10 HISTORY — PX: LAPAROSCOPIC RIGHT COLECTOMY: SHX5925

## 2016-11-10 LAB — CBC
HCT: 35.8 % — ABNORMAL LOW (ref 36.0–46.0)
Hemoglobin: 11.6 g/dL — ABNORMAL LOW (ref 12.0–15.0)
MCH: 27.9 pg (ref 26.0–34.0)
MCHC: 32.4 g/dL (ref 30.0–36.0)
MCV: 86.1 fL (ref 78.0–100.0)
Platelets: 324 10*3/uL (ref 150–400)
RBC: 4.16 MIL/uL (ref 3.87–5.11)
RDW: 13.6 % (ref 11.5–15.5)
WBC: 11.4 10*3/uL — ABNORMAL HIGH (ref 4.0–10.5)

## 2016-11-10 LAB — CREATININE, SERUM
Creatinine, Ser: 0.8 mg/dL (ref 0.44–1.00)
GFR calc Af Amer: >60 mL/min (ref >60)
GFR calc non Af Amer: >60 mL/min (ref >60)

## 2016-11-10 SURGERY — COLECTOMY, RIGHT, LAPAROSCOPIC
Anesthesia: General | Laterality: Right

## 2016-11-10 MED ORDER — MIDAZOLAM HCL 2 MG/2ML IJ SOLN
INTRAMUSCULAR | Status: AC
  Filled 2016-11-10: qty 2

## 2016-11-10 MED ORDER — BRIMONIDINE TARTRATE 0.2 % OP SOLN
1.0000 [drp] | Freq: Every day | OPHTHALMIC | Status: DC
  Administered 2016-11-10 – 2016-11-21 (×12): 1 [drp] via OPHTHALMIC
  Filled 2016-11-10 (×4): qty 5

## 2016-11-10 MED ORDER — SUGAMMADEX SODIUM 200 MG/2ML IV SOLN
INTRAVENOUS | Status: DC | PRN
  Administered 2016-11-10: 140 mg via INTRAVENOUS

## 2016-11-10 MED ORDER — ACETAMINOPHEN 325 MG PO TABS: 650.0000 mg | ORAL_TABLET | Freq: Four times a day (QID) | ORAL | Status: DC | PRN

## 2016-11-10 MED ORDER — PHENYLEPHRINE HCL 10 MG/ML IJ SOLN: INTRAMUSCULAR | Status: DC | PRN

## 2016-11-10 MED ORDER — DIPHENHYDRAMINE HCL 12.5 MG/5ML PO ELIX: 12.5000 mg | ORAL_SOLUTION | Freq: Four times a day (QID) | ORAL | Status: DC | PRN

## 2016-11-10 MED ORDER — ONDANSETRON HCL 4 MG PO TABS
4.0000 mg | ORAL_TABLET | Freq: Four times a day (QID) | ORAL | Status: DC | PRN
Start: 2016-11-10 — End: 2016-11-22

## 2016-11-10 MED ORDER — ROCURONIUM BROMIDE 100 MG/10ML IV SOLN
INTRAVENOUS | Status: DC | PRN
Start: 2016-11-10 — End: 2016-11-10
  Administered 2016-11-10: 50 mg via INTRAVENOUS
  Administered 2016-11-10: 10 mg via INTRAVENOUS

## 2016-11-10 MED ORDER — ONDANSETRON HCL 4 MG/2ML IJ SOLN
INTRAMUSCULAR | Status: DC | PRN
  Administered 2016-11-10: 4 mg via INTRAVENOUS

## 2016-11-10 MED ORDER — LATANOPROST 0.005 % OP SOLN
1.0000 [drp] | Freq: Every day | OPHTHALMIC | Status: DC
  Administered 2016-11-10 – 2016-11-21 (×12): 1 [drp] via OPHTHALMIC
  Filled 2016-11-10 (×2): qty 2.5

## 2016-11-10 MED ORDER — LIDOCAINE 2% (20 MG/ML) 5 ML SYRINGE
INTRAMUSCULAR | Status: DC | PRN
  Administered 2016-11-10: 60 mg via INTRAVENOUS

## 2016-11-10 MED ORDER — PHENYLEPHRINE 40 MCG/ML (10ML) SYRINGE FOR IV PUSH (FOR BLOOD PRESSURE SUPPORT)
PREFILLED_SYRINGE | INTRAVENOUS | Status: DC | PRN
Start: 2016-11-10 — End: 2016-11-10
  Administered 2016-11-10: 40 ug via INTRAVENOUS
  Administered 2016-11-10: 80 ug via INTRAVENOUS
  Administered 2016-11-10 (×2): 120 ug via INTRAVENOUS

## 2016-11-10 MED ORDER — CLINDAMYCIN PHOSPHATE 900 MG/50ML IV SOLN
900.0000 mg | Freq: Three times a day (TID) | INTRAVENOUS | Status: AC
  Administered 2016-11-10: 900 mg via INTRAVENOUS
  Filled 2016-11-10: qty 50

## 2016-11-10 MED ORDER — TIMOLOL MALEATE 0.5 % OP SOLN
1.0000 [drp] | Freq: Every day | OPHTHALMIC | Status: DC
  Administered 2016-11-10 – 2016-11-21 (×12): 1 [drp] via OPHTHALMIC
  Filled 2016-11-10: qty 5

## 2016-11-10 MED ORDER — LIDOCAINE HCL (PF) 1 % IJ SOLN
INTRAMUSCULAR | Status: AC
  Filled 2016-11-10: qty 30

## 2016-11-10 MED ORDER — HEPARIN SODIUM (PORCINE) 5000 UNIT/ML IJ SOLN
5000.0000 [IU] | Freq: Three times a day (TID) | INTRAMUSCULAR | Status: DC
  Administered 2016-11-10 – 2016-11-22 (×37): 5000 [IU] via SUBCUTANEOUS
  Filled 2016-11-10 (×37): qty 1

## 2016-11-10 MED ORDER — GABAPENTIN 300 MG PO CAPS
ORAL_CAPSULE | ORAL | Status: AC
  Administered 2016-11-10: 300 mg via ORAL
  Filled 2016-11-10: qty 1

## 2016-11-10 MED ORDER — DIPHENHYDRAMINE HCL 50 MG/ML IJ SOLN: 12.5000 mg | Freq: Four times a day (QID) | INTRAMUSCULAR | Status: DC | PRN

## 2016-11-10 MED ORDER — ALVIMOPAN 12 MG PO CAPS
12.0000 mg | ORAL_CAPSULE | Freq: Two times a day (BID) | ORAL | Status: DC
  Administered 2016-11-11 – 2016-11-13 (×4): 12 mg via ORAL
  Filled 2016-11-10 (×6): qty 1

## 2016-11-10 MED ORDER — ACETAMINOPHEN 500 MG PO TABS
1000.0000 mg | ORAL_TABLET | Freq: Four times a day (QID) | ORAL | Status: AC
  Administered 2016-11-10 – 2016-11-11 (×4): 1000 mg via ORAL
  Filled 2016-11-10 (×3): qty 2

## 2016-11-10 MED ORDER — DEXAMETHASONE SODIUM PHOSPHATE 10 MG/ML IJ SOLN
INTRAMUSCULAR | Status: DC | PRN
  Administered 2016-11-10: 10 mg via INTRAVENOUS

## 2016-11-10 MED ORDER — DEXTROSE 5 % IV SOLN
5.0000 mg/kg | INTRAVENOUS | Status: AC
  Administered 2016-11-10: 340 mg via INTRAVENOUS
  Filled 2016-11-10: qty 8.5

## 2016-11-10 MED ORDER — ONDANSETRON HCL 4 MG/2ML IJ SOLN
4.0000 mg | Freq: Four times a day (QID) | INTRAMUSCULAR | Status: DC | PRN
  Administered 2016-11-10 – 2016-11-20 (×14): 4 mg via INTRAVENOUS
  Filled 2016-11-10 (×16): qty 2

## 2016-11-10 MED ORDER — ALUM & MAG HYDROXIDE-SIMETH 200-200-20 MG/5ML PO SUSP
30.0000 mL | Freq: Four times a day (QID) | ORAL | Status: DC | PRN
  Administered 2016-11-20: 30 mL via ORAL
  Filled 2016-11-10 (×2): qty 30

## 2016-11-10 MED ORDER — PROPOFOL 10 MG/ML IV BOLUS
INTRAVENOUS | Status: DC | PRN
Start: 2016-11-10 — End: 2016-11-10
  Administered 2016-11-10: 130 mg via INTRAVENOUS

## 2016-11-10 MED ORDER — FENTANYL CITRATE (PF) 250 MCG/5ML IJ SOLN
INTRAMUSCULAR | Status: AC
  Filled 2016-11-10: qty 5

## 2016-11-10 MED ORDER — CHLORHEXIDINE GLUCONATE CLOTH 2 % EX PADS: 6.0000 | MEDICATED_PAD | Freq: Once | CUTANEOUS | Status: DC

## 2016-11-10 MED ORDER — ALVIMOPAN 12 MG PO CAPS
12.0000 mg | ORAL_CAPSULE | ORAL | Status: AC
  Administered 2016-11-10: 12 mg via ORAL

## 2016-11-10 MED ORDER — BUPIVACAINE-EPINEPHRINE (PF) 0.25% -1:200000 IJ SOLN
INTRAMUSCULAR | Status: AC
  Filled 2016-11-10: qty 30

## 2016-11-10 MED ORDER — DIPHENHYDRAMINE HCL 50 MG/ML IJ SOLN
12.5000 mg | Freq: Four times a day (QID) | INTRAMUSCULAR | Status: DC | PRN
  Filled 2016-11-10 (×2): qty 1

## 2016-11-10 MED ORDER — KETOROLAC TROMETHAMINE 15 MG/ML IJ SOLN
15.0000 mg | Freq: Four times a day (QID) | INTRAMUSCULAR | Status: AC
  Administered 2016-11-10 – 2016-11-11 (×4): 15 mg via INTRAVENOUS
  Filled 2016-11-10 (×5): qty 1

## 2016-11-10 MED ORDER — GABAPENTIN 300 MG PO CAPS
300.0000 mg | ORAL_CAPSULE | ORAL | Status: AC
  Administered 2016-11-10: 300 mg via ORAL

## 2016-11-10 MED ORDER — PROMETHAZINE HCL 25 MG/ML IJ SOLN
6.2500 mg | Freq: Once | INTRAMUSCULAR | Status: AC
  Administered 2016-11-10: 6.25 mg via INTRAVENOUS

## 2016-11-10 MED ORDER — DEXTROSE 5 % IV SOLN
500.0000 mg | Freq: Three times a day (TID) | INTRAVENOUS | Status: DC | PRN
  Administered 2016-11-12: 500 mg via INTRAVENOUS
  Filled 2016-11-10 (×3): qty 5

## 2016-11-10 MED ORDER — FENTANYL CITRATE (PF) 100 MCG/2ML IJ SOLN
25.0000 ug | INTRAMUSCULAR | Status: DC | PRN
  Administered 2016-11-10 (×2): 50 ug via INTRAVENOUS

## 2016-11-10 MED ORDER — CLINDAMYCIN PHOSPHATE 900 MG/50ML IV SOLN
INTRAVENOUS | Status: AC
  Filled 2016-11-10: qty 50

## 2016-11-10 MED ORDER — KETOROLAC TROMETHAMINE 15 MG/ML IJ SOLN
15.0000 mg | Freq: Four times a day (QID) | INTRAMUSCULAR | Status: AC | PRN
  Administered 2016-11-11 – 2016-11-14 (×8): 15 mg via INTRAVENOUS
  Filled 2016-11-10 (×9): qty 1

## 2016-11-10 MED ORDER — NALOXONE HCL 0.4 MG/ML IJ SOLN: 0.4000 mg | INTRAMUSCULAR | Status: DC | PRN

## 2016-11-10 MED ORDER — SODIUM CHLORIDE 0.9 % IR SOLN
Status: DC | PRN
  Administered 2016-11-10: 1000 mL

## 2016-11-10 MED ORDER — SODIUM CHLORIDE 0.9% FLUSH: 9.0000 mL | INTRAVENOUS | Status: DC | PRN

## 2016-11-10 MED ORDER — EPHEDRINE SULFATE-NACL 50-0.9 MG/10ML-% IV SOSY
PREFILLED_SYRINGE | INTRAVENOUS | Status: DC | PRN
  Administered 2016-11-10: 15 mg via INTRAVENOUS

## 2016-11-10 MED ORDER — FENTANYL 40 MCG/ML IV SOLN
INTRAVENOUS | Status: DC
  Administered 2016-11-10: 10 ug via INTRAVENOUS
  Administered 2016-11-10: 1000 ug via INTRAVENOUS
  Administered 2016-11-11: 0 ug via INTRAVENOUS
  Administered 2016-11-11: 30 ug via INTRAVENOUS
  Administered 2016-11-11: 40 ug via INTRAVENOUS
  Administered 2016-11-11 – 2016-11-12 (×5): 0 ug via INTRAVENOUS
  Administered 2016-11-12: 0 via INTRAVENOUS
  Administered 2016-11-12 – 2016-11-13 (×3): 0 ug via INTRAVENOUS
  Filled 2016-11-10: qty 25

## 2016-11-10 MED ORDER — LIDOCAINE HCL 1 % IJ SOLN
INTRAMUSCULAR | Status: DC | PRN
  Administered 2016-11-10: 8 mL

## 2016-11-10 MED ORDER — PROPOFOL 10 MG/ML IV BOLUS
INTRAVENOUS | Status: AC
  Filled 2016-11-10: qty 20

## 2016-11-10 MED ORDER — ONDANSETRON HCL 4 MG/2ML IJ SOLN
INTRAMUSCULAR | Status: AC
  Filled 2016-11-10: qty 2

## 2016-11-10 MED ORDER — KCL IN DEXTROSE-NACL 20-5-0.45 MEQ/L-%-% IV SOLN
INTRAVENOUS | Status: DC
  Administered 2016-11-10 – 2016-11-16 (×12): via INTRAVENOUS
  Filled 2016-11-10 (×15): qty 1000

## 2016-11-10 MED ORDER — BUPIVACAINE 0.5 % ON-Q PUMP DUAL CATH 300 ML
300.0000 mL | INJECTION | Status: DC
  Administered 2016-11-10: 300 mL
  Filled 2016-11-10: qty 300

## 2016-11-10 MED ORDER — FENTANYL CITRATE (PF) 100 MCG/2ML IJ SOLN: 12.5000 ug | INTRAMUSCULAR | Status: DC | PRN

## 2016-11-10 MED ORDER — ZOLPIDEM TARTRATE 5 MG PO TABS
5.0000 mg | ORAL_TABLET | Freq: Every evening | ORAL | Status: DC | PRN
  Filled 2016-11-10: qty 1

## 2016-11-10 MED ORDER — ONDANSETRON HCL 4 MG/2ML IJ SOLN
4.0000 mg | Freq: Once | INTRAMUSCULAR | Status: AC | PRN
  Administered 2016-11-10: 4 mg via INTRAVENOUS

## 2016-11-10 MED ORDER — FENTANYL CITRATE (PF) 100 MCG/2ML IJ SOLN
INTRAMUSCULAR | Status: AC
  Administered 2016-11-10: 50 ug via INTRAVENOUS
  Filled 2016-11-10: qty 2

## 2016-11-10 MED ORDER — LACTATED RINGERS IV SOLN
INTRAVENOUS | Status: DC | PRN
  Administered 2016-11-10 (×3): via INTRAVENOUS

## 2016-11-10 MED ORDER — FENTANYL CITRATE (PF) 100 MCG/2ML IJ SOLN
INTRAMUSCULAR | Status: DC | PRN
  Administered 2016-11-10: 25 ug via INTRAVENOUS
  Administered 2016-11-10: 100 ug via INTRAVENOUS
  Administered 2016-11-10: 50 ug via INTRAVENOUS

## 2016-11-10 MED ORDER — HYDRALAZINE HCL 20 MG/ML IJ SOLN: 10.0000 mg | INTRAMUSCULAR | Status: DC | PRN

## 2016-11-10 MED ORDER — BUPIVACAINE ON-Q PAIN PUMP (FOR ORDER SET NO CHG)
INJECTION | Status: AC
  Filled 2016-11-10: qty 1

## 2016-11-10 MED ORDER — BRIMONIDINE TARTRATE-TIMOLOL 0.2-0.5 % OP SOLN
1.0000 [drp] | Freq: Every day | OPHTHALMIC | Status: DC
  Filled 2016-11-10: qty 5

## 2016-11-10 MED ORDER — ONDANSETRON HCL 4 MG/2ML IJ SOLN: 4.0000 mg | Freq: Four times a day (QID) | INTRAMUSCULAR | Status: DC | PRN

## 2016-11-10 MED ORDER — ATORVASTATIN CALCIUM 40 MG PO TABS
40.0000 mg | ORAL_TABLET | Freq: Every day | ORAL | Status: DC
Start: 2016-11-10 — End: 2016-11-22
  Administered 2016-11-10 – 2016-11-22 (×6): 40 mg via ORAL
  Filled 2016-11-10 (×9): qty 1

## 2016-11-10 MED ORDER — ALVIMOPAN 12 MG PO CAPS
ORAL_CAPSULE | ORAL | Status: AC
  Administered 2016-11-10: 12 mg via ORAL
  Filled 2016-11-10: qty 1

## 2016-11-10 MED ORDER — PROMETHAZINE HCL 25 MG/ML IJ SOLN
INTRAMUSCULAR | Status: AC
  Filled 2016-11-10: qty 1

## 2016-11-10 MED ORDER — GABAPENTIN 100 MG PO CAPS
100.0000 mg | ORAL_CAPSULE | Freq: Two times a day (BID) | ORAL | Status: DC
  Administered 2016-11-10 – 2016-11-22 (×11): 100 mg via ORAL
  Filled 2016-11-10 (×15): qty 1

## 2016-11-10 SURGICAL SUPPLY — 88 items
ADH SKN CLS APL DERMABOND .7 (GAUZE/BANDAGES/DRESSINGS) ×1
APPLIER CLIP 5 13 M/L LIGAMAX5 (MISCELLANEOUS)
APPLIER CLIP ROT 10 11.4 M/L (STAPLE)
APR CLP MED LRG 11.4X10 (STAPLE)
APR CLP MED LRG 5 ANG JAW (MISCELLANEOUS)
BLADE CLIPPER SURG (BLADE) IMPLANT
CANISTER SUCT 3000ML PPV (MISCELLANEOUS) ×2 IMPLANT
CATH KIT ON-Q SILVERSOAK 5 (CATHETERS) IMPLANT
CATH KIT ON-Q SILVERSOAK 5IN (CATHETERS) ×4 IMPLANT
CELLS DAT CNTRL 66122 CELL SVR (MISCELLANEOUS) IMPLANT
CHLORAPREP W/TINT 26ML (MISCELLANEOUS) ×2 IMPLANT
CLIP APPLIE 5 13 M/L LIGAMAX5 (MISCELLANEOUS) IMPLANT
CLIP APPLIE ROT 10 11.4 M/L (STAPLE) IMPLANT
COVER MAYO STAND STRL (DRAPES) ×2 IMPLANT
COVER SURGICAL LIGHT HANDLE (MISCELLANEOUS) ×4 IMPLANT
DERMABOND ADVANCED (GAUZE/BANDAGES/DRESSINGS) ×1
DERMABOND ADVANCED .7 DNX12 (GAUZE/BANDAGES/DRESSINGS) IMPLANT
DISSECTOR BLUNT TIP ENDO 5MM (MISCELLANEOUS) IMPLANT
DRAPE HALF SHEET 40X57 (DRAPES) ×2 IMPLANT
DRAPE UTILITY XL STRL (DRAPES) ×10 IMPLANT
DRAPE WARM FLUID 44X44 (DRAPE) ×2 IMPLANT
DRSG OPSITE POSTOP 4X10 (GAUZE/BANDAGES/DRESSINGS) IMPLANT
DRSG OPSITE POSTOP 4X8 (GAUZE/BANDAGES/DRESSINGS) ×1 IMPLANT
ELECT BLADE 6.5 EXT (BLADE) ×2 IMPLANT
ELECT CAUTERY BLADE 6.4 (BLADE) ×4 IMPLANT
ELECT REM PT RETURN 9FT ADLT (ELECTROSURGICAL) ×2
ELECTRODE REM PT RTRN 9FT ADLT (ELECTROSURGICAL) ×1 IMPLANT
GEL ULTRASOUND 20GR AQUASONIC (MISCELLANEOUS) IMPLANT
GLOVE BIO SURGEON STRL SZ 6 (GLOVE) ×4 IMPLANT
GLOVE BIOGEL PI IND STRL 6.5 (GLOVE) ×2 IMPLANT
GLOVE BIOGEL PI IND STRL 8.5 (GLOVE) IMPLANT
GLOVE BIOGEL PI INDICATOR 6.5 (GLOVE) ×2
GLOVE BIOGEL PI INDICATOR 8.5 (GLOVE) ×1
GLOVE ECLIPSE 7.5 STRL STRAW (GLOVE) ×1 IMPLANT
GOWN STRL REUS W/ TWL LRG LVL3 (GOWN DISPOSABLE) ×6 IMPLANT
GOWN STRL REUS W/TWL 2XL LVL3 (GOWN DISPOSABLE) ×4 IMPLANT
GOWN STRL REUS W/TWL LRG LVL3 (GOWN DISPOSABLE) ×12
KIT BASIN OR (CUSTOM PROCEDURE TRAY) ×2 IMPLANT
KIT ROOM TURNOVER OR (KITS) ×2 IMPLANT
L-HOOK LAP DISP 36CM (ELECTROSURGICAL) ×2
LEGGING LITHOTOMY PAIR STRL (DRAPES) IMPLANT
LHOOK LAP DISP 36CM (ELECTROSURGICAL) ×1 IMPLANT
LIGASURE IMPACT 36 18CM CVD LR (INSTRUMENTS) IMPLANT
LIGASURE MARYLAND LAP STAND (ELECTROSURGICAL) IMPLANT
NS IRRIG 1000ML POUR BTL (IV SOLUTION) ×4 IMPLANT
PAD ARMBOARD 7.5X6 YLW CONV (MISCELLANEOUS) ×4 IMPLANT
PENCIL BUTTON HOLSTER BLD 10FT (ELECTRODE) ×6 IMPLANT
RELOAD PROXIMATE 75MM BLUE (ENDOMECHANICALS) ×4 IMPLANT
RELOAD STAPLE 75 3.8 BLU REG (ENDOMECHANICALS) IMPLANT
RETRACTOR WND ALEXIS 18 MED (MISCELLANEOUS) IMPLANT
RTRCTR WOUND ALEXIS 18CM MED (MISCELLANEOUS)
SCISSORS LAP 5X35 DISP (ENDOMECHANICALS) IMPLANT
SET IRRIG TUBING LAPAROSCOPIC (IRRIGATION / IRRIGATOR) IMPLANT
SHEARS HARMONIC ACE PLUS 36CM (ENDOMECHANICALS) IMPLANT
SLEEVE ENDOPATH XCEL 5M (ENDOMECHANICALS) ×3 IMPLANT
SPECIMEN JAR LARGE (MISCELLANEOUS) ×2 IMPLANT
SPONGE LAP 18X18 X RAY DECT (DISPOSABLE) IMPLANT
STAPLER GUN LINEAR PROX 60 (STAPLE) ×1 IMPLANT
STAPLER PROXIMATE 75MM BLUE (STAPLE) ×1 IMPLANT
STAPLER VISISTAT 35W (STAPLE) ×2 IMPLANT
SUCTION POOLE TIP (SUCTIONS) ×2 IMPLANT
SURGILUBE 2OZ TUBE FLIPTOP (MISCELLANEOUS) IMPLANT
SUT MNCRL AB 4-0 PS2 18 (SUTURE) ×2 IMPLANT
SUT PDS AB 1 CT  36 (SUTURE) ×2
SUT PDS AB 1 CT 36 (SUTURE) IMPLANT
SUT PROLENE 2 0 CT2 30 (SUTURE) IMPLANT
SUT PROLENE 2 0 KS (SUTURE) IMPLANT
SUT VIC AB 2-0 SH 18 (SUTURE) ×2 IMPLANT
SUT VIC AB 3-0 SH 18 (SUTURE) ×2 IMPLANT
SUT VIC AB 3-0 SH 8-18 (SUTURE) ×1 IMPLANT
SUT VICRYL AB 2 0 TIES (SUTURE) ×2 IMPLANT
SUT VICRYL AB 3 0 TIES (SUTURE) ×2 IMPLANT
SYR BULB IRRIGATION 50ML (SYRINGE) ×2 IMPLANT
SYS LAPSCP GELPORT 120MM (MISCELLANEOUS) ×2
SYSTEM LAPSCP GELPORT 120MM (MISCELLANEOUS) IMPLANT
TOWEL OR 17X26 10 PK STRL BLUE (TOWEL DISPOSABLE) ×4 IMPLANT
TRAY FOLEY W/METER SILVER 16FR (SET/KITS/TRAYS/PACK) IMPLANT
TRAY LAPAROSCOPIC MC (CUSTOM PROCEDURE TRAY) ×2 IMPLANT
TRAY PROCTOSCOPIC FIBER OPTIC (SET/KITS/TRAYS/PACK) IMPLANT
TROCAR XCEL 12X100 BLDLESS (ENDOMECHANICALS) IMPLANT
TROCAR XCEL BLUNT TIP 100MML (ENDOMECHANICALS) IMPLANT
TROCAR XCEL NON-BLD 11X100MML (ENDOMECHANICALS) IMPLANT
TROCAR XCEL NON-BLD 5MMX100MML (ENDOMECHANICALS) ×2 IMPLANT
TUBE CONNECTING 12X1/4 (SUCTIONS) ×4 IMPLANT
TUBING INSUF HEATED (TUBING) ×2 IMPLANT
TUBING INSUFFLATION (TUBING) ×2 IMPLANT
TUNNELER SHEATH ON-Q 16GX12 DP (PAIN MANAGEMENT) ×1 IMPLANT
YANKAUER SUCT BULB TIP NO VENT (SUCTIONS) ×5 IMPLANT

## 2016-11-10 NOTE — Transfer of Care (Signed)
Immediate Anesthesia Transfer of Care Note  Patient: [name]  Procedure(s) Performed: Procedure(s): LAPAROSCOPIC EXTENDED RIGHT COLECTOMY (Right)  Patient Location: PACU  Anesthesia Type:General  Level of Consciousness: drowsy and patient cooperative  Airway & Oxygen Therapy: Patient Spontanous Breathing and Patient connected to nasal cannula oxygen  Post-op Assessment: Report given to RN, Post -op Vital signs reviewed and stable and Patient moving all extremities  Post vital signs: Reviewed and stable  Last Vitals:  Vitals:   11/10/16 0700 11/10/16 1130  BP: (!) 95/45 (!) 150/63  Pulse: 66 62  Resp: 18 17  Temp: 36.8 C (!) 36.4 C  SpO2: 99% 100%    Last Pain:  Vitals:   11/10/16 0700  TempSrc: Oral         Complications: No apparent anesthesia complications

## 2016-11-10 NOTE — Anesthesia Procedure Notes (Signed)
Procedure Name: Intubation Date/Time: 11/10/2016 8:59 AM Performed by: Trixie Deis A Pre-anesthesia Checklist: Patient identified, Emergency Drugs available, Suction available and Patient being monitored Patient Re-evaluated:Patient Re-evaluated prior to induction Oxygen Delivery Method: Circle System Utilized Preoxygenation: Pre-oxygenation with 100% oxygen Induction Type: IV induction Ventilation: Mask ventilation without difficulty Laryngoscope Size: Mac and 3 Grade View: Grade I Tube type: Oral Number of attempts: 1 Airway Equipment and Method: Stylet and Oral airway Placement Confirmation: ETT inserted through vocal cords under direct vision,  positive ETCO2 and breath sounds checked- equal and bilateral Secured at: 21 cm Tube secured with: Tape Dental Injury: Teeth and Oropharynx as per pre-operative assessment and Injury to lip  Comments: Intubation performed under direct supervision (CRNA and MDA) by Shanon Brow, paramedic student. Bruising to left side of lower lip noted after intubation.

## 2016-11-10 NOTE — Progress Notes (Signed)
Patient arrived from PACU to 8G88 A&Ox4 but in considerable pain.  Has PCA pump and was instructed on how to use.  IV intact and infusing Lactated ringers @ 116ml/hr.  Honeycomb dressing intact with minimal drainage.  3 port sites clean dry and intact with skin glue for closure.  Will continue to monitor.

## 2016-11-10 NOTE — Anesthesia Preprocedure Evaluation (Addendum)

## 2016-11-10 NOTE — Anesthesia Postprocedure Evaluation (Signed)
Anesthesia Post Note  Patient: [name]  Procedure(s) Performed: Procedure(s) (LRB): LAPAROSCOPIC EXTENDED RIGHT COLECTOMY (Right)     Patient location during evaluation: PACU Anesthesia Type: General Level of consciousness: awake, awake and alert and oriented Pain management: pain level controlled Vital Signs Assessment: post-procedure vital signs reviewed and stable Respiratory status: spontaneous breathing, nonlabored ventilation and respiratory function stable Cardiovascular status: blood pressure returned to baseline Postop Assessment: no headache Anesthetic complications: no    Last Vitals:  Vitals:   11/10/16 1345 11/10/16 1600  BP: (!) 149/70   Pulse: (!) 56   Resp: 18 14  Temp: (!) 36.4 C   SpO2: 100% 99%    Last Pain:  Vitals:   11/10/16 1600  TempSrc:   PainSc: 2                  Martie Muhlbauer COKER

## 2016-11-10 NOTE — Op Note (Signed)
Laparoscopic Extended Right Hemicolectomy Procedure Note  Indications: This patient presents for a laparoscopic partial colectomy for transverse colon cancer and ascending colon polyp.  There is a small indeterminate liver mass.    Pre-operative Diagnosis:  Transverse colon cancer, ascending colon polyp  Post-operative Diagnosis:  Same  Surgeon: [name]   Assistants: Judyann Munson, RNFA  Anesthesia: General endotracheal anesthesia and Local anesthesia 1% buffered lidocaine, 0.25.% bupivacaine, with epinephrine  ASA Class: 3  Procedure Details  The patient was seen in the Holding Room. The risks, benefits, complications, treatment options, and expected outcomes were discussed with the patient. The possibilities of reaction to medication, perforation of viscus, bleeding, recurrent infection, finding a normal colon, the need for additional procedures, failure to diagnose a condition, and creating a complication requiring transfusion or operation were discussed with the patient. The patient was advised of the risk of ostomy.  The patient concurred with the proposed plan, giving informed consent.   The patient was taken to the operating room, identified, and the procedure verified as partial colectomy. A Time Out was held and the above information confirmed.  The patient was brought to the operating room and placed supine. After induction of a general anesthetic, a Foley catheter was inserted and the abdomen was prepped and draped in standard fashion. The patient was placed into reverse the lumbar position and rotated to the right. A small incision at the costal margin was made with a #11 blade. Local anesthetic was introduced.  The 5 mm Optiview trocar was placed under direct visualization.  Pneumoperitoneum was insufflated to a pressure of 15 mm Hg. The laparoscope was introduced.     Exploration revealed a normal omentum, small bowel, peritoneum, liver, and stomach. Two 5 mm left sided  5-mm trocars were then placed after anesthetizing the skin and peritoneum with local anesthetic. The ascending colon and hepatic flexure were then mobilized with gentle retraction of the colon in a medial direction with mobilization of the peritoneal reflection with cautery and the EnSeal. The two tattoo marks were easily seen.  One was in the ascending colon and the second one was in the mid transverse colon. The omentum was taken off the transverse colon. Mobilization of this area was complete to expose the retroperitoneum.  There was minimal blood loss during this portion of the procedure.      The colon was evaluated to make sure it was adequately mobilized.  A 6 cm vertical incision was made above the umbilicus.  The subcutaneous tissues were divided with the cautery.  The fascia was the cautery as well. The fascial incision was carried out point the skin incision. The protractor was placed and the abdomen to protect the skin. The colon was delivered through the incision.  The colon was resected with a linear stapling device proximal and distal to the area in question in regard to the specimen. The Enseal was used for most of the mesentery, but clamp/ties were used on the larger vessels.  The specimen was submitted to pathology.      An side-to-side, functional end-to-end anastomosis was performed through the small anterior incision with the linear stapling device. The mucosa was inspected and found to be hemostatic. Closure was achieved with the linear stapling device. A 3-0 vicryl suture was used to reapproximate the angle of the anastomosis. Hemostasis was confirmed. The bowel anastomosis was returned.  The abdomen was irrigated.    The OnQ tunneler was placed on both sides of the fascial incision. The fascial  incision was then closed with a running #1 PDS sutures x 2. The incisions were closed with staples. The OnQ catheters were advanced through the wound.  Soft dressings were applied.  Instrument,  sponge, and needle counts were correct prior to abdominal closure and at the conclusion of the case.   Findings: Large mass in transverse colon with tumor deposit in mesentery.  No carcinomatosis or liver lesions were seen.    Estimated Blood Loss: <50 mL         Drains: none  Specimens: right and transverse colon            Complications: None; patient tolerated the procedure well.         Disposition: PACU - hemodynamically stable.

## 2016-11-10 NOTE — Interval H&P Note (Signed)
History and Physical Interval Note:  11/10/2016 8:28 AM  [name]  has presented today for surgery, with the diagnosis of colon cancer  The various methods of treatment have been discussed with the patient and family. After consideration of risks, benefits and other options for treatment, the patient has consented to  Procedure(s): LAPAROSCOPIC EXTENDED RIGHT COLECTOMY (Right) as a surgical intervention .  The patient's history has been reviewed, patient examined, no change in status, stable for surgery.  I have reviewed the patient's chart and labs.  Questions were answered to the patient's satisfaction.     Joe Tanney

## 2016-11-11 ENCOUNTER — Encounter (HOSPITAL_COMMUNITY): Payer: Self-pay | Admitting: General Surgery

## 2016-11-11 LAB — CBC
HCT: 34 % — ABNORMAL LOW (ref 36.0–46.0)
Hemoglobin: 10.7 g/dL — ABNORMAL LOW (ref 12.0–15.0)
MCH: 27.2 pg (ref 26.0–34.0)
MCHC: 31.5 g/dL (ref 30.0–36.0)
MCV: 86.3 fL (ref 78.0–100.0)
Platelets: 298 10*3/uL (ref 150–400)
RBC: 3.94 MIL/uL (ref 3.87–5.11)
RDW: 13.3 % (ref 11.5–15.5)
WBC: 11.4 10*3/uL — ABNORMAL HIGH (ref 4.0–10.5)

## 2016-11-11 LAB — BASIC METABOLIC PANEL
Anion gap: 6 (ref 5–15)
BUN: <5 mg/dL — ABNORMAL LOW (ref 6–20)
CO2: 27 mmol/L (ref 22–32)
Calcium: 8.5 mg/dL — ABNORMAL LOW (ref 8.9–10.3)
Chloride: 105 mmol/L (ref 101–111)
Creatinine, Ser: 0.84 mg/dL (ref 0.44–1.00)
GFR calc Af Amer: >60 mL/min (ref >60)
GFR calc non Af Amer: >60 mL/min (ref >60)
Glucose, Bld: 152 mg/dL — ABNORMAL HIGH (ref 65–99)
Potassium: 3.9 mmol/L (ref 3.5–5.1)
Sodium: 138 mmol/L (ref 135–145)

## 2016-11-11 MED ORDER — INFLUENZA VAC SPLIT HIGH-DOSE 0.5 ML IM SUSY
0.5000 mL | PREFILLED_SYRINGE | INTRAMUSCULAR | Status: DC
  Filled 2016-11-11: qty 0.5

## 2016-11-11 NOTE — Progress Notes (Signed)
1 Day Post-Op   Subjective/Chief Complaint: Doing well.  No n/v.  No flatus yet, but "feels like something is right there."    Objective: Vital signs in last 24 hours: Temp:  [97.5 F (36.4 C)-98.5 F (36.9 C)] 98.5 F (36.9 C) (09/20 0630) Pulse Rate:  [56-68] 66 (09/20 0630) Resp:  [14-22] 18 (09/20 0816) BP: (117-166)/(54-96) 117/57 (09/20 0630) SpO2:  [97 %-100 %] 100 % (09/20 0816)    Intake/Output from previous day: 09/19 0701 - 09/20 0700 In: 3905 [P.O.:580; I.V.:3175] Out: 5456 [Urine:3980; Emesis/NG output:50; Blood:25] Intake/Output this shift: Total I/O In: 120 [P.O.:120] Out: -   General appearance: alert, cooperative and no distress Resp: breathing comfortably GI: soft, non distended, wounds c/d/i.  onQ in place Extremities: extremities normal, atraumatic, no cyanosis or edema  Lab Results:   Recent Labs  11/10/16 1347 11/11/16 0418  WBC 11.4* 11.4*  HGB 11.6* 10.7*  HCT 35.8* 34.0*  PLT 324 298   BMET  Recent Labs  11/08/16 1446 11/10/16 1347 11/11/16 0418  NA 137  --  138  K 3.8  --  3.9  CL 104  --  105  CO2 26  --  27  GLUCOSE 99  --  152*  BUN 24*  --  <5*  CREATININE 0.96 0.80 0.84  CALCIUM 9.1  --  8.5*   PT/INR  Recent Labs  11/08/16 1446  LABPROT 12.6  INR 0.95   ABG No results for input(s): PHART, HCO3 in the last 72 hours.  Invalid input(s): PCO2, PO2  Studies/Results: No results found.  Anti-infectives: Anti-infectives    Start     Dose/Rate Route Frequency Ordered Stop   11/10/16 1400  clindamycin (CLEOCIN) IVPB 900 mg     900 mg 100 mL/hr over 30 Minutes Intravenous Every 8 hours 11/10/16 1342 11/10/16 1505   11/10/16 0815  gentamicin (GARAMYCIN) 340 mg in dextrose 5 % 50 mL IVPB     5 mg/kg  68 kg 117 mL/hr over 30 Minutes Intravenous To Surgery 11/10/16 0816 11/10/16 0844   11/10/16 0808  clindamycin (CLEOCIN) 900 MG/50ML IVPB    Comments:  Trixie Deis   : cabinet override      11/10/16 0808 11/10/16  2014      Assessment/Plan: s/p Procedure(s): LAPAROSCOPIC EXTENDED RIGHT COLECTOMY (Right) Advance diet Continue foley due to urinary output monitoring plan d/c foley in AM.  Full liquids if desired. HTN - on PRN meds Ambulate.    LOS: 1 day    [facility] 11/11/2016

## 2016-11-12 ENCOUNTER — Inpatient Hospital Stay (HOSPITAL_COMMUNITY): Payer: Medicare HMO

## 2016-11-12 LAB — CBC
HCT: 38 % (ref 36.0–46.0)
Hemoglobin: 11.9 g/dL — ABNORMAL LOW (ref 12.0–15.0)
MCH: 27.5 pg (ref 26.0–34.0)
MCHC: 31.3 g/dL (ref 30.0–36.0)
MCV: 87.8 fL (ref 78.0–100.0)
Platelets: 341 10*3/uL (ref 150–400)
RBC: 4.33 MIL/uL (ref 3.87–5.11)
RDW: 13.7 % (ref 11.5–15.5)
WBC: 13.7 10*3/uL — ABNORMAL HIGH (ref 4.0–10.5)

## 2016-11-12 LAB — BASIC METABOLIC PANEL
Anion gap: 6 (ref 5–15)
BUN: 6 mg/dL (ref 6–20)
CO2: 27 mmol/L (ref 22–32)
Calcium: 8.6 mg/dL — ABNORMAL LOW (ref 8.9–10.3)
Chloride: 103 mmol/L (ref 101–111)
Creatinine, Ser: 0.98 mg/dL (ref 0.44–1.00)
GFR calc Af Amer: >60 mL/min (ref >60)
GFR calc non Af Amer: 54 mL/min — ABNORMAL LOW (ref >60)
Glucose, Bld: 144 mg/dL — ABNORMAL HIGH (ref 65–99)
Potassium: 3.9 mmol/L (ref 3.5–5.1)
Sodium: 136 mmol/L (ref 135–145)

## 2016-11-12 MED ORDER — PROMETHAZINE HCL 25 MG/ML IJ SOLN
12.5000 mg | Freq: Four times a day (QID) | INTRAMUSCULAR | Status: DC | PRN
  Administered 2016-11-12 – 2016-11-13 (×3): 12.5 mg via INTRAVENOUS
  Filled 2016-11-12 (×3): qty 1

## 2016-11-12 NOTE — Progress Notes (Signed)
Patient continued to vomit, agreeable to having NG tube inserted.

## 2016-11-12 NOTE — Progress Notes (Signed)
2 Days Post-Op   Subjective/Chief Complaint: Had episode of emesis last night.  Given phenergan.    Objective: Vital signs in last 24 hours: Temp:  [97.9 F (36.6 C)-98.8 F (37.1 C)] 98.8 F (37.1 C) (09/21 0500) Pulse Rate:  [77-84] 84 (09/21 0500) Resp:  [18-22] 18 (09/21 0500) BP: (144-165)/(54-71) 164/63 (09/21 0500) SpO2:  [97 %-100 %] 98 % (09/21 0500) Last BM Date: 11/10/16 (per patient prior to admission)  Intake/Output from previous day: 09/20 0701 - 09/21 0700 In: 2820 [P.O.:120; I.V.:2700] Out: 1425 [Urine:1075; Emesis/NG output:350] Intake/Output this shift: No intake/output data recorded.  General appearance: alert, cooperative and no distress Resp: breathing comfortably GI: soft, non distended, wounds c/d/i.  onQ in place Extremities: extremities normal, atraumatic, no cyanosis or edema  Lab Results:   Recent Labs  11/11/16 0418 11/12/16 0436  WBC 11.4* 13.7*  HGB 10.7* 11.9*  HCT 34.0* 38.0  PLT 298 341   BMET  Recent Labs  11/11/16 0418 11/12/16 0436  NA 138 136  K 3.9 3.9  CL 105 103  CO2 27 27  GLUCOSE 152* 144*  BUN <5* 6  CREATININE 0.84 0.98  CALCIUM 8.5* 8.6*   PT/INR No results for input(s): LABPROT, INR in the last 72 hours. ABG No results for input(s): PHART, HCO3 in the last 72 hours.  Invalid input(s): PCO2, PO2  Studies/Results: No results found.  Anti-infectives: Anti-infectives    Start     Dose/Rate Route Frequency Ordered Stop   11/10/16 1400  clindamycin (CLEOCIN) IVPB 900 mg     900 mg 100 mL/hr over 30 Minutes Intravenous Every 8 hours 11/10/16 1342 11/10/16 1505   11/10/16 0815  gentamicin (GARAMYCIN) 340 mg in dextrose 5 % 50 mL IVPB     5 mg/kg  68 kg 117 mL/hr over 30 Minutes Intravenous To Surgery 11/10/16 0816 11/10/16 0844   11/10/16 0808  clindamycin (CLEOCIN) 900 MG/50ML IVPB    Comments:  Trixie Deis   : cabinet override      11/10/16 0808 11/10/16 2014      Assessment/Plan: s/p  Procedure(s): LAPAROSCOPIC EXTENDED RIGHT COLECTOMY (Right) Sips of clears.  Ileus.  D/c foley PCA. PRN antihypertensives.     LOS: 2 days    [facility] 11/12/2016

## 2016-11-12 NOTE — Progress Notes (Signed)
Patient has vomited another 200 mL. Spoke with her about NG, currently she is refusing placement. She states "if it keeps on I will let you". Will continue to monitor and educate.

## 2016-11-12 NOTE — Progress Notes (Signed)
Patient had multiple episodes of vomiting during night. Dr. Dema Severin notified. NPO order given as well as 12.5 of phenergan Q6 PRN. Verbal order given if patient continues to vomit NG may be placed. Currently patient says she is nauseous however she has not had anymore episodes of vomiting since phenergan. She has however, begun to complain of more pain. When encouraged to use PCA patient states that "someone told me not to use that". I have continued to encourage patient to use PCA as well as use IS and cough and deep breath. Will continue to monitor.  Jimmie Molly, RN

## 2016-11-13 LAB — BASIC METABOLIC PANEL
Anion gap: 6 (ref 5–15)
BUN: 7 mg/dL (ref 6–20)
CO2: 27 mmol/L (ref 22–32)
Calcium: 8.4 mg/dL — ABNORMAL LOW (ref 8.9–10.3)
Chloride: 104 mmol/L (ref 101–111)
Creatinine, Ser: 0.99 mg/dL (ref 0.44–1.00)
GFR calc Af Amer: >60 mL/min (ref >60)
GFR calc non Af Amer: 53 mL/min — ABNORMAL LOW (ref >60)
Glucose, Bld: 115 mg/dL — ABNORMAL HIGH (ref 65–99)
Potassium: 3.9 mmol/L (ref 3.5–5.1)
Sodium: 137 mmol/L (ref 135–145)

## 2016-11-13 LAB — CBC
HCT: 37.3 % (ref 36.0–46.0)
Hemoglobin: 11.6 g/dL — ABNORMAL LOW (ref 12.0–15.0)
MCH: 27.2 pg (ref 26.0–34.0)
MCHC: 31.1 g/dL (ref 30.0–36.0)
MCV: 87.4 fL (ref 78.0–100.0)
Platelets: 307 10*3/uL (ref 150–400)
RBC: 4.27 MIL/uL (ref 3.87–5.11)
RDW: 13.6 % (ref 11.5–15.5)
WBC: 11.3 10*3/uL — ABNORMAL HIGH (ref 4.0–10.5)

## 2016-11-13 MED ORDER — FENTANYL CITRATE (PF) 100 MCG/2ML IJ SOLN
12.5000 ug | INTRAMUSCULAR | Status: DC | PRN
  Administered 2016-11-17: 12.5 ug via INTRAVENOUS
  Filled 2016-11-13: qty 2

## 2016-11-13 NOTE — Progress Notes (Signed)
[facility] Progress Note  3 Days Post-Op  Subjective: CC:  Denies significant abdominal pain, not using PCA because she states she does not tolerate narcotics well. Significant nausea/vomiting yesterday requiring NGT placement. Denies flatus or BM since surgery. Denies SOB. Pulling >1,000 cc on IS.  Afebrile, VSS  Objective: Vital signs in last 24 hours: Temp:  [98.1 F (36.7 C)-99.2 F (37.3 C)] 98.1 F (36.7 C) (09/22 0530) Pulse Rate:  [76-93] 76 (09/22 0530) Resp:  [15-22] 18 (09/22 0743) BP: (139-163)/(72-95) 158/72 (09/22 0530) SpO2:  [98 %-100 %] 100 % (09/22 0530) Last BM Date: 11/10/16  Intake/Output from previous day: 09/21 0701 - 09/22 0700 In: 2588.3 [P.O.:10; I.V.:2463.3; NG/GT:60; IV Piggyback:55] Out: 2100 [Urine:900; Emesis/NG output:1200] Intake/Output this shift: No intake/output data recorded.  PE: Gen:  Alert, NAD, pleasant Card:  Regular rate and rhythm, pedal pulses 2+ BL Pulm:  Normal effort, clear to auscultation bilaterally Abd: Soft, appropriately tender, mild distention, hypoactive BS, midline incisions c/d/i w honeycomb in place; OnQ catheters in place. Skin: warm and dry, no rashes  Psych: A&Ox3   Lab Results:   Recent Labs  11/12/16 0436 11/13/16 0628  WBC 13.7* 11.3*  HGB 11.9* 11.6*  HCT 38.0 37.3  PLT 341 307   BMET  Recent Labs  11/11/16 0418 11/12/16 0436  NA 138 136  K 3.9 3.9  CL 105 103  CO2 27 27  GLUCOSE 152* 144*  BUN <5* 6  CREATININE 0.84 0.98  CALCIUM 8.5* 8.6*   PT/INR No results for input(s): LABPROT, INR in the last 72 hours. CMP     Component Value Date/Time   NA 136 11/12/2016 0436   NA 141 09/23/2016 1256   K 3.9 11/12/2016 0436   K 4.1 09/23/2016 1256   CL 103 11/12/2016 0436   CO2 27 11/12/2016 0436   CO2 27 09/23/2016 1256   GLUCOSE 144 (H) 11/12/2016 0436   GLUCOSE 94 09/23/2016 1256   BUN 6 11/12/2016 0436   BUN 15.2 09/23/2016 1256   CREATININE 0.98 11/12/2016 0436    CREATININE 0.8 09/23/2016 1256   CALCIUM 8.6 (L) 11/12/2016 0436   CALCIUM 9.3 09/23/2016 1256   PROT 7.4 11/08/2016 1446   PROT 6.9 09/23/2016 1256   ALBUMIN 3.7 11/08/2016 1446   ALBUMIN 3.6 09/23/2016 1256   AST 20 11/08/2016 1446   AST 22 09/23/2016 1256   ALT 19 11/08/2016 1446   ALT 20 09/23/2016 1256   ALKPHOS 82 11/08/2016 1446   ALKPHOS 94 09/23/2016 1256   BILITOT 0.5 11/08/2016 1446   BILITOT 0.70 09/23/2016 1256   GFRNONAA 54 (L) 11/12/2016 0436   GFRAA >60 11/12/2016 0436   Lipase  No results found for: LIPASE     Studies/Results: Dg Abd Portable 1v  Result Date: 11/12/2016 CLINICAL DATA:  NG tube placement. Laparoscopic right colectomy 11/10/2016. EXAM: PORTABLE ABDOMEN - 1 VIEW COMPARISON:  None. FINDINGS: Nasogastric tube is present with tip in the right upper quadrant likely over the distal stomach/proximal duodenum. Surgical clips over the right upper quadrant surgical suture line over the left mid abdomen. There are multiple air-filled loops of large and small bowel with small bowel measuring up to 3.4 cm in diameter likely postoperative ileus. Degenerate change of the spine. IMPRESSION: Postsurgical change with air-filled mildly dilated small bowel loops likely due to postoperative ileus. Nasogastric tube with tip over the right upper quadrant likely over the distal stomach/ proximal duodenum. Electronically Signed   By: Marin Olp  M.D.   On: 11/12/2016 16:10    Anti-infectives: Anti-infectives    Start     Dose/Rate Route Frequency Ordered Stop   11/10/16 1400  clindamycin (CLEOCIN) IVPB 900 mg     900 mg 100 mL/hr over 30 Minutes Intravenous Every 8 hours 11/10/16 1342 11/10/16 1505   11/10/16 0815  gentamicin (GARAMYCIN) 340 mg in dextrose 5 % 50 mL IVPB     5 mg/kg  68 kg 117 mL/hr over 30 Minutes Intravenous To Surgery 11/10/16 0816 11/10/16 0844   11/10/16 0808  clindamycin (CLEOCIN) 900 MG/50ML IVPB    Comments:  Trixie Deis   : cabinet override       11/10/16 0808 11/10/16 2014       Assessment/Plan Transverse colon cancer, ascending colon polyp POD#3 S/P LAPAROSCOPIC EXTENDED RIGHT COLECTOMY (Right) 11/10/16 [name]  - post-op ileus, NG placed 9/21 w/ 1,200 cc/24h bilious output   - continue NG tube and await bowel function  - pain: D/C PCA, PRN Robaxin IV, continue toradol, PRN fentanyl   - mobilize/OOB - ok to clamp NGT long enough to mobilize  - IS  HTN: PRN meds FEN: NPO, IVF, NGT to LIWS  ID:  VTE: SCD's, subcutaneous heparin  Foley: removed 9/21       LOS: 3 days    Jill Alexanders , [facility] 11/13/2016, 8:01 AM Pager: [phone] Consults: [phone] Mon-Fri 7:00 am-4:30 pm Sat-Sun 7:00 am-11:30 am

## 2016-11-14 LAB — BASIC METABOLIC PANEL
Anion gap: 6 (ref 5–15)
BUN: 10 mg/dL (ref 6–20)
CO2: 28 mmol/L (ref 22–32)
Calcium: 8.6 mg/dL — ABNORMAL LOW (ref 8.9–10.3)
Chloride: 101 mmol/L (ref 101–111)
Creatinine, Ser: 0.94 mg/dL (ref 0.44–1.00)
GFR calc Af Amer: >60 mL/min (ref >60)
GFR calc non Af Amer: 57 mL/min — ABNORMAL LOW (ref >60)
Glucose, Bld: 110 mg/dL — ABNORMAL HIGH (ref 65–99)
Potassium: 4.4 mmol/L (ref 3.5–5.1)
Sodium: 135 mmol/L (ref 135–145)

## 2016-11-14 LAB — CBC
HCT: 34.9 % — ABNORMAL LOW (ref 36.0–46.0)
Hemoglobin: 10.8 g/dL — ABNORMAL LOW (ref 12.0–15.0)
MCH: 27 pg (ref 26.0–34.0)
MCHC: 30.9 g/dL (ref 30.0–36.0)
MCV: 87.3 fL (ref 78.0–100.0)
Platelets: 292 10*3/uL (ref 150–400)
RBC: 4 MIL/uL (ref 3.87–5.11)
RDW: 13.5 % (ref 11.5–15.5)
WBC: 9.5 10*3/uL (ref 4.0–10.5)

## 2016-11-14 MED ORDER — PANTOPRAZOLE SODIUM 40 MG IV SOLR
40.0000 mg | INTRAVENOUS | Status: DC
  Administered 2016-11-14 – 2016-11-21 (×8): 40 mg via INTRAVENOUS
  Filled 2016-11-14 (×8): qty 40

## 2016-11-14 MED ORDER — ORAL CARE MOUTH RINSE
15.0000 mL | Freq: Two times a day (BID) | OROMUCOSAL | Status: DC
  Administered 2016-11-15 – 2016-11-22 (×7): 15 mL via OROMUCOSAL

## 2016-11-14 NOTE — Progress Notes (Signed)
[facility] Progress Note  4 Days Post-Op  Subjective: CC:  Pain controlled. Denies fever, chills, SOB. No further emesis. Did not mobilize yesterday - did not know she was supposed to. Urinating. Denies flatus or BM.  Afebrile, some HTN past 24h  Objective: Vital signs in last 24 hours: Temp:  [98.1 F (36.7 C)-98.6 F (37 C)] 98.1 F (36.7 C) (09/23 0411) Pulse Rate:  [70-90] 88 (09/23 0411) Resp:  [16-18] 18 (09/23 0411) BP: (115-163)/(69-76) 158/73 (09/23 0411) SpO2:  [96 %-100 %] 96 % (09/23 0411) Weight:  [68 kg (149 lb 14.6 oz)] 68 kg (149 lb 14.6 oz) (09/22 0900) Last BM Date: 11/10/16  Intake/Output from previous day: 09/22 0701 - 09/23 0700 In: 0  Out: 1850 [Urine:450; Emesis/NG output:1400] Intake/Output this shift: No intake/output data recorded.  PE: Gen:  Alert, NAD, pleasant Card:  Regular rate and rhythm, pedal pulses 2+ BL Pulm:  Normal effort, clear to auscultation bilaterally Abd: Soft, appropriately tender, mild distention, hypoactive BS, midline incisions c/d/i w honeycomb in place; OnQ catheters in place. Skin: warm and dry, no rashes  Psych: A&Ox3   Lab Results:   Recent Labs  11/13/16 0628 11/14/16 0345  WBC 11.3* 9.5  HGB 11.6* 10.8*  HCT 37.3 34.9*  PLT 307 292   BMET  Recent Labs  11/13/16 0628 11/14/16 0345  NA 137 135  K 3.9 4.4  CL 104 101  CO2 27 28  GLUCOSE 115* 110*  BUN 7 10  CREATININE 0.99 0.94  CALCIUM 8.4* 8.6*   PT/INR No results for input(s): LABPROT, INR in the last 72 hours. CMP     Component Value Date/Time   NA 135 11/14/2016 0345   NA 141 09/23/2016 1256   K 4.4 11/14/2016 0345   K 4.1 09/23/2016 1256   CL 101 11/14/2016 0345   CO2 28 11/14/2016 0345   CO2 27 09/23/2016 1256   GLUCOSE 110 (H) 11/14/2016 0345   GLUCOSE 94 09/23/2016 1256   BUN 10 11/14/2016 0345   BUN 15.2 09/23/2016 1256   CREATININE 0.94 11/14/2016 0345   CREATININE 0.8 09/23/2016 1256   CALCIUM 8.6 (L)  11/14/2016 0345   CALCIUM 9.3 09/23/2016 1256   PROT 7.4 11/08/2016 1446   PROT 6.9 09/23/2016 1256   ALBUMIN 3.7 11/08/2016 1446   ALBUMIN 3.6 09/23/2016 1256   AST 20 11/08/2016 1446   AST 22 09/23/2016 1256   ALT 19 11/08/2016 1446   ALT 20 09/23/2016 1256   ALKPHOS 82 11/08/2016 1446   ALKPHOS 94 09/23/2016 1256   BILITOT 0.5 11/08/2016 1446   BILITOT 0.70 09/23/2016 1256   GFRNONAA 57 (L) 11/14/2016 0345   GFRAA >60 11/14/2016 0345   Lipase  No results found for: LIPASE     Studies/Results: Dg Abd Portable 1v  Result Date: 11/12/2016 CLINICAL DATA:  NG tube placement. Laparoscopic right colectomy 11/10/2016. EXAM: PORTABLE ABDOMEN - 1 VIEW COMPARISON:  None. FINDINGS: Nasogastric tube is present with tip in the right upper quadrant likely over the distal stomach/proximal duodenum. Surgical clips over the right upper quadrant surgical suture line over the left mid abdomen. There are multiple air-filled loops of large and small bowel with small bowel measuring up to 3.4 cm in diameter likely postoperative ileus. Degenerate change of the spine. IMPRESSION: Postsurgical change with air-filled mildly dilated small bowel loops likely due to postoperative ileus. Nasogastric tube with tip over the right upper quadrant likely over the distal stomach/ proximal duodenum. Electronically Signed  By: Marin Olp M.D.   On: 11/12/2016 16:10    Anti-infectives: Anti-infectives    Start     Dose/Rate Route Frequency Ordered Stop   11/10/16 1400  clindamycin (CLEOCIN) IVPB 900 mg     900 mg 100 mL/hr over 30 Minutes Intravenous Every 8 hours 11/10/16 1342 11/10/16 1505   11/10/16 0815  gentamicin (GARAMYCIN) 340 mg in dextrose 5 % 50 mL IVPB     5 mg/kg  68 kg 117 mL/hr over 30 Minutes Intravenous To Surgery 11/10/16 0816 11/10/16 0844   11/10/16 0808  clindamycin (CLEOCIN) 900 MG/50ML IVPB    Comments:  Trixie Deis   : cabinet override      11/10/16 0808 11/10/16 2014        Assessment/Plan Transverse colon cancer, ascending colon polyp POD#4 S/P LAPAROSCOPIC EXTENDED RIGHT COLECTOMY (Right) 11/10/16 [name]             - post-op ileus, NG placed 9/21. 1,400 cc/24h bilious output              - continue NG tube and await bowel function             - pain: PRN Robaxin IV, continue PRN toradol through tomorrow (POD#5) then d/c, PRN fentanyl              - mobilize/OOB             - IS  HTN: PRN meds FEN: NPO, IVF, NGT to LIWS  ID: clindamycin 9/19 surgical prophylaxis VTE: SCD's, subcutaneous heparin  Foley: removed 9/21   Plan: await bowel function - UP TO CHAIR AND AMBULATE. Ok to clamp NGT long enough to go for a walk.  LOS: 4 days    [facility] 11/14/2016, 7:43 AM Pager: [phone] Consults: [phone] Mon-Fri 7:00 am-4:30 pm Sat-Sun 7:00 am-11:30 am

## 2016-11-14 NOTE — Progress Notes (Signed)
Spoke with daughter Paulette Blanch at length about the plan of care for the pt and progression post surgically.  Today her temp is normal and vital signs WNL, WBC normal.  I told her she is progressing OK right now, she had ambulated w/ walker in the hall, is using IS, sitting in the rocking chair.  Daughter states that the pt needs "baking soda" to neutralize her stomach contents/acid. Dr. Georgette Dover notified and orders received for Protonix.  Daughter is also concerned with her nutritional status. I explained about the NG tube and its role. [name] (her surgeon) will be rounding on pt tomorrow.

## 2016-11-14 NOTE — Progress Notes (Signed)
Discontinued Q ball sites. Placed banaides on abdomen

## 2016-11-14 NOTE — Progress Notes (Signed)
MD  Pt. [name] needs a Education officer, museum consult.She shared with the nurse a mental health issues at her home that the nurse beleives prevents  from safe recovery.

## 2016-11-15 LAB — BASIC METABOLIC PANEL
Anion gap: 7 (ref 5–15)
BUN: 10 mg/dL (ref 6–20)
CO2: 24 mmol/L (ref 22–32)
Calcium: 8.4 mg/dL — ABNORMAL LOW (ref 8.9–10.3)
Chloride: 105 mmol/L (ref 101–111)
Creatinine, Ser: 1.02 mg/dL — ABNORMAL HIGH (ref 0.44–1.00)
GFR calc Af Amer: 59 mL/min — ABNORMAL LOW (ref >60)
GFR calc non Af Amer: 51 mL/min — ABNORMAL LOW (ref >60)
Glucose, Bld: 110 mg/dL — ABNORMAL HIGH (ref 65–99)
Potassium: 4.2 mmol/L (ref 3.5–5.1)
Sodium: 136 mmol/L (ref 135–145)

## 2016-11-15 LAB — CBC
HCT: 31.9 % — ABNORMAL LOW (ref 36.0–46.0)
Hemoglobin: 10 g/dL — ABNORMAL LOW (ref 12.0–15.0)
MCH: 27 pg (ref 26.0–34.0)
MCHC: 31.3 g/dL (ref 30.0–36.0)
MCV: 86 fL (ref 78.0–100.0)
Platelets: 247 10*3/uL (ref 150–400)
RBC: 3.71 MIL/uL — ABNORMAL LOW (ref 3.87–5.11)
RDW: 13.4 % (ref 11.5–15.5)
WBC: 6.9 10*3/uL (ref 4.0–10.5)

## 2016-11-15 MED ORDER — WHITE PETROLATUM GEL
Status: AC
  Administered 2016-11-15: 12:00:00
  Filled 2016-11-15: qty 1

## 2016-11-15 MED ORDER — SODIUM CHLORIDE 0.9% FLUSH
10.0000 mL | INTRAVENOUS | Status: DC | PRN
  Administered 2016-11-16 (×2): 10 mL
  Administered 2016-11-22: 20 mL
  Filled 2016-11-15 (×3): qty 40

## 2016-11-15 NOTE — Progress Notes (Signed)
Peripherally Inserted Central Catheter/Midline Placement  The IV Nurse has discussed with the patient and/or persons authorized to consent for the patient, the purpose of this procedure and the potential benefits and risks involved with this procedure.  The benefits include less needle sticks, lab draws from the catheter, and the patient may be discharged home with the catheter. Risks include, but not limited to, infection, bleeding, blood clot (thrombus formation), and puncture of an artery; nerve damage and irregular heartbeat and possibility to perform a PICC exchange if needed/ordered by physician.  Alternatives to this procedure were also discussed.  Bard Power PICC patient education guide, fact sheet on infection prevention and patient information card has been provided to patient /or left at bedside.    PICC/Midline Placement Documentation        [name] 11/15/2016, 3:54 PM

## 2016-11-15 NOTE — Care Management Note (Signed)
Case Management Note  Patient Details  Name: Samariah Hokenson MRN: [id number] Date of Birth: Oct 20, 1940  Subjective/Objective:                    Action/Plan:  Discussed discharge planning with patient.   Order for Social worker consult .  Patient states she is safe to go home with her husband . She does provide care for him . Her daughters are not able to assist with him. NCM able to arrange home health for patient's husband. That would need to be done through his PCP.   Husband called case Management office. Asking for a bedside commode, walker and a cane for his wife.  Discussed with patient , she confirms she does need bedside commode , cane and walker . Expected Discharge Date:                  Expected Discharge Plan:  Home/Self Care  In-House Referral:     Discharge planning Services  CM Consult  Post Acute Care Choice:  Durable Medical Equipment Choice offered to:  Patient  DME Arranged:    DME Agency:     HH Arranged:    [place] Agency:     Status of Service:  In process, will continue to follow  If discussed at Long Length of Stay Meetings, dates discussed:    Additional Comments:  Marilu Favre, RN 11/15/2016, 1:07 PM

## 2016-11-15 NOTE — Progress Notes (Signed)
[facility] NOTE   Pharmacy Consult for TPN Indication: prolonged ileus  Patient Measurements: Height: 5\' 3"  (160 cm) Weight: 149 lb 14.6 oz (68 kg) IBW/kg (Calculated) : 52.4 TPN AdjBW (KG): 56.3 Body mass index is 26.56 kg/m.  Assessment: 51 yof with new stage IV colon cancer, now s/p colectomy 9/19. Pharmacy consulted to start TPN with post-op ileus.  PMH: Fe-deficiency anemia, HTN, HLD, colon cancer, GERD, CAD, CVA  GI: Ileus. NG placed 9/21 - 1500 cc/24h bilious output. BM charted 9/24 - entereg d/c'd. PPI IV Endo: Pre-DM (last A1c 6.3). CBGs controlled Insulin requirements in the past 24 hours:  Lytes: WNL Renal:SCr stable, good UOP. D51/2NS+20KCl at 100 ml/hr Pulm: RA Cards: atorva Hepatobil: WNL on 9/17 Neuro: fent prn, gaba ID: no issues  Best Practices: hep SQ TPN Access: pending TPN start date: 11/16/2016  Nutritional Goals (RD recommendations pending): kCal: Protein:   Current Nutrition:  NPO  Plan:  TPN ordered after cut-off time for today > will start tomorrow TPN labs, dietitian consult entered   Elicia Lamp, PharmD, BCPS Clinical Pharmacist Rx Phone # for today: [phone] After 3:30PM, please call Main Rx: #[id number] 11/15/2016 1:47 PM

## 2016-11-15 NOTE — Care Management Important Message (Signed)
Important Message  Patient Details  Name: [name] MRN: [id number] Date of Birth: 1940/04/13   Medicare Important Message Given:  Yes    Corley Kohls [address] 11/15/2016, 2:19 PM

## 2016-11-16 LAB — COMPREHENSIVE METABOLIC PANEL
ALT: 12 U/L — ABNORMAL LOW (ref 14–54)
AST: 15 U/L (ref 15–41)
Albumin: 2.5 g/dL — ABNORMAL LOW (ref 3.5–5.0)
Alkaline Phosphatase: 62 U/L (ref 38–126)
Anion gap: 6 (ref 5–15)
BUN: 6 mg/dL (ref 6–20)
CO2: 28 mmol/L (ref 22–32)
Calcium: 8.6 mg/dL — ABNORMAL LOW (ref 8.9–10.3)
Chloride: 102 mmol/L (ref 101–111)
Creatinine, Ser: 0.9 mg/dL (ref 0.44–1.00)
GFR calc Af Amer: >60 mL/min (ref >60)
GFR calc non Af Amer: 60 mL/min — ABNORMAL LOW (ref >60)
Glucose, Bld: 111 mg/dL — ABNORMAL HIGH (ref 65–99)
Potassium: 4.4 mmol/L (ref 3.5–5.1)
Sodium: 136 mmol/L (ref 135–145)
Total Bilirubin: 0.7 mg/dL (ref 0.3–1.2)
Total Protein: 5.1 g/dL — ABNORMAL LOW (ref 6.5–8.1)

## 2016-11-16 LAB — DIFFERENTIAL
Basophils Absolute: 0 10*3/uL (ref 0.0–0.1)
Basophils Relative: 0 %
Eosinophils Absolute: 0.2 10*3/uL (ref 0.0–0.7)
Eosinophils Relative: 3 %
Lymphocytes Relative: 18 %
Lymphs Abs: 1.2 10*3/uL (ref 0.7–4.0)
Monocytes Absolute: 0.8 10*3/uL (ref 0.1–1.0)
Monocytes Relative: 12 %
Neutro Abs: 4.3 10*3/uL (ref 1.7–7.7)
Neutrophils Relative %: 67 %

## 2016-11-16 LAB — CBC
HCT: 31.3 % — ABNORMAL LOW (ref 36.0–46.0)
Hemoglobin: 9.8 g/dL — ABNORMAL LOW (ref 12.0–15.0)
MCH: 26.9 pg (ref 26.0–34.0)
MCHC: 31.3 g/dL (ref 30.0–36.0)
MCV: 86 fL (ref 78.0–100.0)
Platelets: 244 10*3/uL (ref 150–400)
RBC: 3.64 MIL/uL — ABNORMAL LOW (ref 3.87–5.11)
RDW: 13.4 % (ref 11.5–15.5)
WBC: 6.5 10*3/uL (ref 4.0–10.5)

## 2016-11-16 LAB — TRIGLYCERIDES: Triglycerides: 88 mg/dL (ref <150)

## 2016-11-16 LAB — GLUCOSE, CAPILLARY: Glucose-Capillary: 100 mg/dL — ABNORMAL HIGH (ref 65–99)

## 2016-11-16 LAB — PHOSPHORUS: Phosphorus: 3.4 mg/dL (ref 2.5–4.6)

## 2016-11-16 LAB — MAGNESIUM: Magnesium: 1.8 mg/dL (ref 1.7–2.4)

## 2016-11-16 LAB — PREALBUMIN: Prealbumin: 13.8 mg/dL — ABNORMAL LOW (ref 18–38)

## 2016-11-16 MED ORDER — TRACE MINERALS CR-CU-MN-SE-ZN 10-1000-500-60 MCG/ML IV SOLN
INTRAVENOUS | Status: AC
  Administered 2016-11-16: 18:00:00 via INTRAVENOUS
  Filled 2016-11-16: qty 720

## 2016-11-16 MED ORDER — KCL IN DEXTROSE-NACL 20-5-0.45 MEQ/L-%-% IV SOLN: INTRAVENOUS | Status: DC

## 2016-11-16 MED ORDER — INSULIN ASPART 100 UNIT/ML ~~LOC~~ SOLN
0.0000 [IU] | Freq: Four times a day (QID) | SUBCUTANEOUS | Status: DC
  Administered 2016-11-18: 1 [IU] via SUBCUTANEOUS

## 2016-11-16 MED ORDER — KCL IN DEXTROSE-NACL 20-5-0.45 MEQ/L-%-% IV SOLN: INTRAVENOUS | Status: AC

## 2016-11-16 MED ORDER — FAT EMULSION 20 % IV EMUL
240.0000 mL | INTRAVENOUS | Status: AC
  Administered 2016-11-16: 240 mL via INTRAVENOUS
  Filled 2016-11-16: qty 250

## 2016-11-16 MED ORDER — MAGNESIUM SULFATE 2 GM/50ML IV SOLN
2.0000 g | Freq: Once | INTRAVENOUS | Status: AC
  Administered 2016-11-16: 2 g via INTRAVENOUS
  Filled 2016-11-16 (×2): qty 50

## 2016-11-16 NOTE — Progress Notes (Signed)
[facility] NOTE   Pharmacy Consult for TPN Indication: prolonged ileus  Patient Measurements: Height: 5\' 3"  (160 cm) Weight: 149 lb 14.6 oz (68 kg) IBW/kg (Calculated) : 52.4 TPN AdjBW (KG): 56.3 Body mass index is 26.56 kg/m.  Assessment: 61 yof with new stage IV colon cancer, increasing issues with having BMs, now s/p colectomy 9/19. Pharmacy consulted to start TPN with post-op ileus.  PMH: Fe-deficiency anemia, HTN, HLD, colon cancer, GERD, CAD, CVA  GI: Ileus improving per Trauma. NG placed 9/21 - 900 cc/24h bilious output. Having flatus/BMs. Pre-albumin 13.8. No N/V with NGT in place. Zofran prn. PPI IV Endo: Pre-DM (last A1c 6.3). CBGs controlled Insulin requirements in the past 24 hours: none ordered Lytes: WNL except Mag 1.8 (goal >/=2 w/ ileus) Renal: SCr stable, UOP 0.3 per documentation. D51/2NS+20KCl at 50 ml/hr Pulm: RA Cards: atorva Hepatobil: LFTs/Tbili/TG wnl Neuro: fent prn, gaba ID: no issues  Best Practices: hep SQ TPN Access: PICC placed 9/24 TPN start date: 11/16/2016  Nutritional Goals (RD recommendations pending): KCal: 1400 - 1700 kcal/day Protein: 70 - 80 g/day  Current Nutrition:  NPO  Plan:  Initiate Clinimix E 5/15 at 3ml/hr Initiate 20% lipid emulsion at 82ml/hr Decrease D51/2NS+20KCl to 20 ml/hr when TPN starts at 1800  This provides 36 g of protein and 991 kCals per day meeting 51% of protein and 71% of kCal needs Add MVI and trace elements in TPN Start sensitive SSI and adjust as needed  Mag sulfate 2g IV x 1 Monitor TPN labs F/U resolution of ileus   Elicia Lamp, PharmD, BCPS Clinical Pharmacist Rx Phone # for today: [phone] After 3:30PM, please call Main Rx: #[id number] 11/16/2016 8:37 AM

## 2016-11-16 NOTE — Telephone Encounter (Signed)
Called pt but no answer so I left voicemail requesting pt to call the office back but I do see she is still in the hospital after surgery

## 2016-11-16 NOTE — Telephone Encounter (Signed)
I wrote a px -in IN box  She can try to see if this is easier or asking surgeon  Thanks

## 2016-11-16 NOTE — Telephone Encounter (Signed)
Shawn RN with [employer] ins left v/m; need Hex peck code which starts with an E; also needs vendor that is going to supply code; fax requesting this info was sent on 11/15/16. Request fax 11/16/16. Fax # [phone].

## 2016-11-16 NOTE — Progress Notes (Signed)
Initial Nutrition Assessment  Late Entry for December 10, 2016  DOCUMENTATION CODES:   Not applicable  INTERVENTION:   -TPN management per pharmacy -RD will follow for diet advancement and supplement as appropriate  NUTRITION DIAGNOSIS:   Inadequate oral intake related to altered GI function as evidenced by NPO status.  GOAL:   Patient will meet greater than or equal to 90% of their needs  MONITOR:   Diet advancement, Weight trends, Labs, Skin, I & O's  REASON FOR ASSESSMENT:   Consult New TPN/TNA  ASSESSMENT:   Pt is a 76 yo F referred by [name] for a new dx of colon cancer.  She presented with rectal bleeding (last year) and increasing constipation this year.  She underwent colonoscopy and was seen to have two cancers.   S/p Procedure(s) on 11/10/16: LAPAROSCOPIC EXTENDED RIGHT COLECTOMY (Right)   9/21- intractable vomiting, NGT inserted 9/24- PICC placed   Pt remains with NGT connected to low, intermittent suction. Noted 980 ml output within the past 24 hours.   Spoke with pt, who reports she generally has a good appetite, however, noted slight decline in intake, mobility, and weakness over the past 3-4 weeks, which she suspects is related to recent cancer dx. She was consuming about 2 meals PTA (Breakfast: cereal, Dinner: meat, starch, and vegetable).   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   Pt reports UBW around 150#. She suspects wt loss, however, unsure of how much. No significant wt changes over the past year.   Nutrition-Focused physical exam completed. Findings are mild fat depletion, mild muscle depletion, and no edema.  Suspect mild muscle depletion (in lower extremities only) likely related to decreased mobility.  Plan is to initiate TPN this evening. Per pharmacy note, will initiate Clinimix E 5/15 at 81ml/hr and 20% lipid emulsion at 31ml/hr. Regimen to  provides 991 kcals and 36 grams protein (58% of estimated kcal needs and 40% of estimated protein needs).   Discussed with pt how she will receive nutrition until cleared for PO intake.   Pt at high risk for malnutrition, however, unable to identity at this time.  Labs reviewed.   Diet Order:  Diet NPO time specified Except for: Ice Chips, Sips with Meds, Other (See Comments) TPN (CLINIMIX-E) Adult  Skin:   (closed abdominal incision)  Last BM:  Dec 10, 2016  Height:   Ht Readings from Last 1 Encounters:  11/13/16 5\' 3"  (1.6 m)    Weight:   Wt Readings from Last 1 Encounters:  11/13/16 149 lb 14.6 oz (68 kg)    Ideal Body Weight:  52.3 kg  BMI:  Body mass index is 26.56 kg/m.  Estimated Nutritional Needs:   Kcal:  1700-1900  Protein:  90-105 grams  Fluid:  1.7-1.9 L  EDUCATION NEEDS:   Education needs addressed  Brentley Horrell A. Jimmye Norman, RD, LDN, CDE Pager: [phone] After hours Pager: [phone]

## 2016-11-16 NOTE — Telephone Encounter (Signed)
I do not know vendor or what a Hex code is ? Perhaps her surgeon will need to order this as part of her PT?   Not sure

## 2016-11-16 NOTE — Telephone Encounter (Signed)
Spoke to [employer] and she advise me that the order for the portable commode usually does go through a DME store and not straight to her insurance. She said she is closing this order but [name] could either write pt a new order and have her check which DME store around here takes her insurance or she can have the pt advise the surgeon of the request and see if they will order it as part of her f/u PT like [name] mentioned. Shawn said the problem is pt had [place] fax over the order directly to her insurance instead of the DME store and the DME store is the one who usually knows exactly which commode is covered and they usually know the codes that Shawn, RN is asking about

## 2016-11-16 NOTE — Progress Notes (Signed)
[facility] Progress Note  6 Days Post-Op  Subjective: CC:  Pt has had some flatus and loose BMs, but still having a fair amount of bilious output from NGT.  No n/v with NGT in place.    Objective: Vital signs in last 24 hours: Temp:  [98.8 F (37.1 C)-99.1 F (37.3 C)] 99.1 F (37.3 C) (09/25 0400) Pulse Rate:  [73-82] 76 (09/25 0400) Resp:  [16-18] 18 (09/25 0400) BP: (114-142)/(51-58) 114/52 (09/25 0400) SpO2:  [94 %-98 %] 94 % (09/25 0400) Last BM Date: 11/16/16  Intake/Output from previous day: 09/24 0701 - 09/25 0700 In: 1846.7 [I.V.:1846.7] Out: 1405 [Urine:425; Emesis/NG output:980] Intake/Output this shift: No intake/output data recorded.  PE: Gen:  Alert, NAD, pleasant Card:  Regular rate and rhythm, pedal pulses 2+ BL Pulm:  Normal effort, Abd: Soft, appropriately tender, mild distention but less than yesterday midline incisions c/d/i w honeycomb in place Skin: warm and dry, no rashes  Psych: A&Ox3   Lab Results:   Recent Labs  11/15/16 0520 11/16/16 0553  WBC 6.9 6.5  HGB 10.0* 9.8*  HCT 31.9* 31.3*  PLT 247 244   BMET  Recent Labs  11/15/16 0520 11/16/16 0553  NA 136 136  K 4.2 4.4  CL 105 102  CO2 24 28  GLUCOSE 110* 111*  BUN 10 6  CREATININE 1.02* 0.90  CALCIUM 8.4* 8.6*   PT/INR No results for input(s): LABPROT, INR in the last 72 hours. CMP     Component Value Date/Time   NA 136 11/16/2016 0553   NA 141 09/23/2016 1256   K 4.4 11/16/2016 0553   K 4.1 09/23/2016 1256   CL 102 11/16/2016 0553   CO2 28 11/16/2016 0553   CO2 27 09/23/2016 1256   GLUCOSE 111 (H) 11/16/2016 0553   GLUCOSE 94 09/23/2016 1256   BUN 6 11/16/2016 0553   BUN 15.2 09/23/2016 1256   CREATININE 0.90 11/16/2016 0553   CREATININE 0.8 09/23/2016 1256   CALCIUM 8.6 (L) 11/16/2016 0553   CALCIUM 9.3 09/23/2016 1256   PROT 5.1 (L) 11/16/2016 0553   PROT 6.9 09/23/2016 1256   ALBUMIN 2.5 (L) 11/16/2016 0553   ALBUMIN 3.6 09/23/2016 1256   AST 15 11/16/2016 0553   AST 22 09/23/2016 1256   ALT 12 (L) 11/16/2016 0553   ALT 20 09/23/2016 1256   ALKPHOS 62 11/16/2016 0553   ALKPHOS 94 09/23/2016 1256   BILITOT 0.7 11/16/2016 0553   BILITOT 0.70 09/23/2016 1256   GFRNONAA 60 (L) 11/16/2016 0553   GFRAA >60 11/16/2016 0553   Lipase  No results found for: LIPASE     Studies/Results: No results found.  Anti-infectives: Anti-infectives    Start     Dose/Rate Route Frequency Ordered Stop   11/10/16 1400  clindamycin (CLEOCIN) IVPB 900 mg     900 mg 100 mL/hr over 30 Minutes Intravenous Every 8 hours 11/10/16 1342 11/10/16 1505   11/10/16 0815  gentamicin (GARAMYCIN) 340 mg in dextrose 5 % 50 mL IVPB     5 mg/kg  68 kg 117 mL/hr over 30 Minutes Intravenous To Surgery 11/10/16 0816 11/10/16 0844   11/10/16 0808  clindamycin (CLEOCIN) 900 MG/50ML IVPB    Comments:  Trixie Deis   : cabinet override      11/10/16 0808 11/10/16 2014       Assessment/Plan Transverse colon cancer, ascending colon polyp POD#6 S/P LAPAROSCOPIC EXTENDED RIGHT COLECTOMY (Right) 11/10/16 [name]             -  post-op ileus, improving             - continue NG tube and await bowel function             - pain: PRN Robaxin IV. PRN fentanyl              - mobilize/OOB             - IS  HTN: PRN meds FEN: NPO, IVF, NGT to LIWS  ID: clindamycin 9/19 surgical prophylaxis VTE: SCD's, subcutaneous heparin  Foley: removed 9/21  TNA for prolonged ileus.  Hope to d/c NGT tomorrow.    Plan: await bowel function - UP TO CHAIR AND AMBULATE. Ok to clamp NGT long enough to go for a walk.  LOS: 6 days    [place] , [facility] 11/16/2016, 10:30 AM

## 2016-11-17 LAB — BASIC METABOLIC PANEL
Anion gap: 8 (ref 5–15)
BUN: 8 mg/dL (ref 6–20)
CO2: 26 mmol/L (ref 22–32)
Calcium: 8.1 mg/dL — ABNORMAL LOW (ref 8.9–10.3)
Chloride: 101 mmol/L (ref 101–111)
Creatinine, Ser: 0.84 mg/dL (ref 0.44–1.00)
GFR calc Af Amer: >60 mL/min (ref >60)
GFR calc non Af Amer: >60 mL/min (ref >60)
Glucose, Bld: 118 mg/dL — ABNORMAL HIGH (ref 65–99)
Potassium: 4 mmol/L (ref 3.5–5.1)
Sodium: 135 mmol/L (ref 135–145)

## 2016-11-17 LAB — GLUCOSE, CAPILLARY
Glucose-Capillary: 112 mg/dL — ABNORMAL HIGH (ref 65–99)
Glucose-Capillary: 112 mg/dL — ABNORMAL HIGH (ref 65–99)
Glucose-Capillary: 116 mg/dL — ABNORMAL HIGH (ref 65–99)
Glucose-Capillary: 119 mg/dL — ABNORMAL HIGH (ref 65–99)

## 2016-11-17 LAB — MAGNESIUM: Magnesium: 2.2 mg/dL (ref 1.7–2.4)

## 2016-11-17 LAB — PHOSPHORUS: Phosphorus: 3.8 mg/dL (ref 2.5–4.6)

## 2016-11-17 MED ORDER — KCL IN DEXTROSE-NACL 20-5-0.45 MEQ/L-%-% IV SOLN
INTRAVENOUS | Status: DC
  Administered 2016-11-17 – 2016-11-20 (×2): via INTRAVENOUS
  Filled 2016-11-17 (×2): qty 1000

## 2016-11-17 MED ORDER — FAT EMULSION 20 % IV EMUL
240.0000 mL | INTRAVENOUS | Status: AC
  Administered 2016-11-17: 240 mL via INTRAVENOUS
  Filled 2016-11-17: qty 250

## 2016-11-17 MED ORDER — M.V.I. ADULT IV INJ
INJECTION | INTRAVENOUS | Status: AC
  Administered 2016-11-17: 18:00:00 via INTRAVENOUS
  Filled 2016-11-17: qty 1800

## 2016-11-17 NOTE — Progress Notes (Signed)
[facility] Progress Note  7 Days Post-Op  Subjective: CC:  More flatus today.  Having BMs.  No nausea.  NGT still bilious, but output decreased.     Objective: Vital signs in last 24 hours: Temp:  [98.1 F (36.7 C)-98.7 F (37.1 C)] 98.1 F (36.7 C) (09/26 0423) Pulse Rate:  [64-70] 64 (09/26 0423) Resp:  [18] 18 (09/26 0423) BP: (98-144)/(49-60) 124/60 (09/26 0423) SpO2:  [96 %-99 %] 97 % (09/26 0423) Last BM Date: 11/16/16  Intake/Output from previous day: 09/25 0701 - 09/26 0700 In: 300 [I.V.:240; NG/GT:60] Out: 700 [Emesis/NG output:700] Intake/Output this shift: No intake/output data recorded.  PE: Gen:  Alert, NAD, pleasant, able to get to bedside commode with minimal assistance.   Pulm:  Normal effort, Abd: Soft, appropriately tender, non distended, midline incisions c/d/i w honeycomb in place Skin: warm and dry, rash on back.  Looks like contact dermatitis.    Psych: A&Ox3   Lab Results:   Recent Labs  11/15/16 0520 11/16/16 0553  WBC 6.9 6.5  HGB 10.0* 9.8*  HCT 31.9* 31.3*  PLT 247 244   BMET  Recent Labs  11/16/16 0553 11/17/16 0532  NA 136 135  K 4.4 4.0  CL 102 101  CO2 28 26  GLUCOSE 111* 118*  BUN 6 8  CREATININE 0.90 0.84  CALCIUM 8.6* 8.1*   PT/INR No results for input(s): LABPROT, INR in the last 72 hours. CMP     Component Value Date/Time   NA 135 11/17/2016 0532   NA 141 09/23/2016 1256   K 4.0 11/17/2016 0532   K 4.1 09/23/2016 1256   CL 101 11/17/2016 0532   CO2 26 11/17/2016 0532   CO2 27 09/23/2016 1256   GLUCOSE 118 (H) 11/17/2016 0532   GLUCOSE 94 09/23/2016 1256   BUN 8 11/17/2016 0532   BUN 15.2 09/23/2016 1256   CREATININE 0.84 11/17/2016 0532   CREATININE 0.8 09/23/2016 1256   CALCIUM 8.1 (L) 11/17/2016 0532   CALCIUM 9.3 09/23/2016 1256   PROT 5.1 (L) 11/16/2016 0553   PROT 6.9 09/23/2016 1256   ALBUMIN 2.5 (L) 11/16/2016 0553   ALBUMIN 3.6 09/23/2016 1256   AST 15 11/16/2016 0553   AST  22 09/23/2016 1256   ALT 12 (L) 11/16/2016 0553   ALT 20 09/23/2016 1256   ALKPHOS 62 11/16/2016 0553   ALKPHOS 94 09/23/2016 1256   BILITOT 0.7 11/16/2016 0553   BILITOT 0.70 09/23/2016 1256   GFRNONAA >60 11/17/2016 0532   GFRAA >60 11/17/2016 0532   Lipase  No results found for: LIPASE     Studies/Results: No results found.  Anti-infectives: Anti-infectives    Start     Dose/Rate Route Frequency Ordered Stop   11/10/16 1400  clindamycin (CLEOCIN) IVPB 900 mg     900 mg 100 mL/hr over 30 Minutes Intravenous Every 8 hours 11/10/16 1342 11/10/16 1505   11/10/16 0815  gentamicin (GARAMYCIN) 340 mg in dextrose 5 % 50 mL IVPB     5 mg/kg  68 kg 117 mL/hr over 30 Minutes Intravenous To Surgery 11/10/16 0816 11/10/16 0844   11/10/16 0808  clindamycin (CLEOCIN) 900 MG/50ML IVPB    Comments:  Trixie Deis   : cabinet override      11/10/16 0808 11/10/16 2014       Assessment/Plan Transverse colon cancer, ascending colon polyp POD#7 S/P LAPAROSCOPIC EXTENDED RIGHT COLECTOMY (Right) 11/10/16 [name]             -  post-op ileus, improving             - d/c ngt today and add ice chips.             - pain: PRN Robaxin IV. PRN fentanyl              - mobilize/OOB             - IS  HTN: PRN meds ID: clindamycin 9/19 surgical prophylaxis VTE: SCD's, subcutaneous heparin  Foley: removed 9/21  TNA for prolonged ileus.  Would leave on until day prior to d/c given weight loss and pt ileus.   Ambulate   LOS: 7 days    [place] , [facility] 11/17/2016, 8:49 AM

## 2016-11-17 NOTE — Progress Notes (Signed)
[facility] NOTE   Pharmacy Consult for TPN Indication: prolonged ileus  Patient Measurements: Height: 5\' 3"  (160 cm) Weight: 149 lb 14.6 oz (68 kg) IBW/kg (Calculated) : 52.4 TPN AdjBW (KG): 56.3 Body mass index is 26.56 kg/m.  Usual body weight: ~150 lb per patient  Assessment: 27 yof with new stage IV colon cancer, increasing issues with having BMs, now s/p colectomy 9/19. Usually with good appetite but notes slight decrease in intake over last 3-4 weeks. Pharmacy consulted to start TPN with post-op ileus.  PMH: Fe-deficiency anemia, HTN, HLD, colon cancer, GERD, CAD, CVA  GI: Ileus improving per Trauma. Intractable N/V - placed NGT - 530 cc/24h bilious output, decreased. Having flatus/BMs. Per Surgery - "would continue TPN until day prior to d/c due to wt loss and ileus." Pre-albumin 13.8. PPI IV Endo: Pre-DM (last A1c 6.3). CBGs controlled Insulin requirements in the past 24 hours: 0 units Lytes: WNL, Mag 2.2 (s/p mag sulf 2g x 1; goal >/=2 w/ ileus) Renal: SCr stable wnl, UOP appears inaccurate. D51/2NS+20KCl at 20 ml/hr Pulm: RA Cards: no issues Hepatobil: LFTs/Tbili/TG wnl Neuro: fent prn ID: no issues  Best Practices: hep SQ TPN Access: PICC TPN start date: 11/16/2016  Nutritional Goals (per RD recommendations 9/26): KCal: 1700 - 1900 kcal/day Protein: 90 - 105 g/day  Current Nutrition:  NPO Clinimix + IVFE  Plan:  Increase Clinimix E 5/15 to 58ml/hr Continue 20% lipid emulsion at 20 ml/hr Decrease D51/2NS+20KCl to 10 ml/hr when new TPN bag starts at 1800  This provides 90 g of protein and 1758 kCals per day - meeting 100% of protein and kCal needs Add MVI and trace elements in TPN D/c sensitive SSI tomorrow if continues to not be needed Monitor TPN labs F/U resolution of ileus, Surgery plans   Elicia Lamp, PharmD, BCPS Clinical Pharmacist Rx Phone # for today: [phone] After 3:30PM, please call Main Rx:  #[id number] 11/17/2016 8:56 AM

## 2016-11-17 NOTE — Progress Notes (Signed)
Pt complaining of dull pain to left upper abdomen at 5/10. Offered pill medication, pt refused stating she does not tolerate pills. PRN IV med given. Will monitor pt.

## 2016-11-18 LAB — COMPREHENSIVE METABOLIC PANEL
ALT: 22 U/L (ref 14–54)
AST: 26 U/L (ref 15–41)
Albumin: 2.6 g/dL — ABNORMAL LOW (ref 3.5–5.0)
Alkaline Phosphatase: 69 U/L (ref 38–126)
Anion gap: 6 (ref 5–15)
BUN: 14 mg/dL (ref 6–20)
CO2: 26 mmol/L (ref 22–32)
Calcium: 8.4 mg/dL — ABNORMAL LOW (ref 8.9–10.3)
Chloride: 103 mmol/L (ref 101–111)
Creatinine, Ser: 0.76 mg/dL (ref 0.44–1.00)
GFR calc Af Amer: >60 mL/min (ref >60)
GFR calc non Af Amer: >60 mL/min (ref >60)
Glucose, Bld: 113 mg/dL — ABNORMAL HIGH (ref 65–99)
Potassium: 3.9 mmol/L (ref 3.5–5.1)
Sodium: 135 mmol/L (ref 135–145)
Total Bilirubin: 0.4 mg/dL (ref 0.3–1.2)
Total Protein: 5.8 g/dL — ABNORMAL LOW (ref 6.5–8.1)

## 2016-11-18 LAB — MAGNESIUM: Magnesium: 2.1 mg/dL (ref 1.7–2.4)

## 2016-11-18 LAB — GLUCOSE, CAPILLARY
Glucose-Capillary: 110 mg/dL — ABNORMAL HIGH (ref 65–99)
Glucose-Capillary: 112 mg/dL — ABNORMAL HIGH (ref 65–99)
Glucose-Capillary: 115 mg/dL — ABNORMAL HIGH (ref 65–99)
Glucose-Capillary: 130 mg/dL — ABNORMAL HIGH (ref 65–99)
Glucose-Capillary: 87 mg/dL (ref 65–99)

## 2016-11-18 LAB — PHOSPHORUS: Phosphorus: 3.7 mg/dL (ref 2.5–4.6)

## 2016-11-18 MED ORDER — FAT EMULSION 20 % IV EMUL
240.0000 mL | INTRAVENOUS | Status: AC
  Administered 2016-11-18: 240 mL via INTRAVENOUS
  Filled 2016-11-18: qty 250

## 2016-11-18 MED ORDER — POTASSIUM CHLORIDE 10 MEQ/50ML IV SOLN
10.0000 meq | Freq: Once | INTRAVENOUS | Status: AC
  Administered 2016-11-18: 10 meq via INTRAVENOUS
  Filled 2016-11-18 (×2): qty 50

## 2016-11-18 MED ORDER — TRACE MINERALS CR-CU-MN-SE-ZN 10-1000-500-60 MCG/ML IV SOLN
INTRAVENOUS | Status: AC
  Administered 2016-11-18: 17:00:00 via INTRAVENOUS
  Filled 2016-11-18: qty 1800

## 2016-11-18 NOTE — Progress Notes (Signed)
[facility] Progress Note  8 Days Post-Op  Subjective: CC:  NGT removed yesterday.  No emesis recorded.  Stools slowing down a bit. Pt denies nausea.  A few belches, but continues to pass gas.    Objective: Vital signs in last 24 hours: Temp:  [98.5 F (36.9 C)-99.4 F (37.4 C)] 98.5 F (36.9 C) (09/27 0435) Pulse Rate:  [76-81] 81 (09/27 0435) Resp:  [18] 18 (09/27 0435) BP: (110-127)/(50-57) 110/57 (09/27 0435) SpO2:  [97 %-100 %] 100 % (09/27 0435) Last BM Date: 11/18/16  Intake/Output from previous day: 09/26 0701 - 09/27 0700 In: 323.1 [P.O.:180; I.V.:143.1] Out: 100 [Urine:100] Intake/Output this shift: Total I/O In: -  Out: 100 [Urine:100]  PE: Gen:  Alert, NAD, pleasant, able to get to bedside commode with minimal assistance.   Pulm:  Normal effort, Abd: Soft, appropriately tender, non distended, midline incisions c/d/i w honeycomb in place.  No erythema Skin: warm and dry, rash on back.  Looks like contact dermatitis.    Psych: A&Ox3   Lab Results:   Recent Labs  11/16/16 0553  WBC 6.5  HGB 9.8*  HCT 31.3*  PLT 244   BMET  Recent Labs  11/17/16 0532 11/18/16 0405  NA 135 135  K 4.0 3.9  CL 101 103  CO2 26 26  GLUCOSE 118* 113*  BUN 8 14  CREATININE 0.84 0.76  CALCIUM 8.1* 8.4*   PT/INR No results for input(s): LABPROT, INR in the last 72 hours. CMP     Component Value Date/Time   NA 135 11/18/2016 0405   NA 141 09/23/2016 1256   K 3.9 11/18/2016 0405   K 4.1 09/23/2016 1256   CL 103 11/18/2016 0405   CO2 26 11/18/2016 0405   CO2 27 09/23/2016 1256   GLUCOSE 113 (H) 11/18/2016 0405   GLUCOSE 94 09/23/2016 1256   BUN 14 11/18/2016 0405   BUN 15.2 09/23/2016 1256   CREATININE 0.76 11/18/2016 0405   CREATININE 0.8 09/23/2016 1256   CALCIUM 8.4 (L) 11/18/2016 0405   CALCIUM 9.3 09/23/2016 1256   PROT 5.8 (L) 11/18/2016 0405   PROT 6.9 09/23/2016 1256   ALBUMIN 2.6 (L) 11/18/2016 0405   ALBUMIN 3.6 09/23/2016 1256   AST 26 11/18/2016 0405   AST 22 09/23/2016 1256   ALT 22 11/18/2016 0405   ALT 20 09/23/2016 1256   ALKPHOS 69 11/18/2016 0405   ALKPHOS 94 09/23/2016 1256   BILITOT 0.4 11/18/2016 0405   BILITOT 0.70 09/23/2016 1256   GFRNONAA >60 11/18/2016 0405   GFRAA >60 11/18/2016 0405   Lipase  No results found for: LIPASE     Studies/Results: No results found.  Anti-infectives: Anti-infectives    Start     Dose/Rate Route Frequency Ordered Stop   11/10/16 1400  clindamycin (CLEOCIN) IVPB 900 mg     900 mg 100 mL/hr over 30 Minutes Intravenous Every 8 hours 11/10/16 1342 11/10/16 1505   11/10/16 0815  gentamicin (GARAMYCIN) 340 mg in dextrose 5 % 50 mL IVPB     5 mg/kg  68 kg 117 mL/hr over 30 Minutes Intravenous To Surgery 11/10/16 0816 11/10/16 0844   11/10/16 0808  clindamycin (CLEOCIN) 900 MG/50ML IVPB    Comments:  Trixie Deis   : cabinet override      11/10/16 0808 11/10/16 2014       Assessment/Plan Transverse colon cancer, ascending colon polyp POD#8 S/P LAPAROSCOPIC EXTENDED RIGHT COLECTOMY (Right) 11/10/16 [name]             -  post-op ileus, improving             - Start clears today HTN: PRN meds ID: clindamycin 9/19 surgical prophylaxis VTE: SCD's, subcutaneous heparin  Foley: removed 9/21  TNA for prolonged ileus.  Would leave on until day prior to d/c given weight loss and pt ileus.   Ambulate   LOS: 8 days    [place] , [facility] 11/18/2016, 9:40 AM

## 2016-11-18 NOTE — Care Management (Signed)
Provided patient with private duty list. She is aware it would be private pay. [name] RN BSN [phone]
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
## 2016-11-18 NOTE — Progress Notes (Signed)
[place] NOTE   Pharmacy Consult for TPN Indication: prolonged ileus  Patient Measurements: Height: 5\' 3"  (160 cm) Weight: 149 lb 14.6 oz (68 kg) IBW/kg (Calculated) : 52.4 TPN AdjBW (KG): 56.3 Body mass index is 26.56 kg/m.  Usual body weight: ~150 lb per patient  Assessment: 74 yof with new stage IV colon cancer, increasing issues with having BMs, now s/p colectomy 9/19. Usually with good appetite but notes slight decrease in intake over last 3-4 weeks. Pharmacy consulted to start TPN with post-op ileus.  PMH: Fe-deficiency anemia, HTN, HLD, colon cancer, GERD, CAD, CVA  GI: Ileus improving. Intractable N/V with NGT placed - now removed 9/26 with no N/V. Having flatus/BMs. Per Surgery - "would continue TPN until day prior to d/c due to wt loss and ileus." Starting clear liquids 9/27. Pre-albumin 13.8. PPI IV Endo: Pre-DM (A1c 6.3). CBGs controlled - sensitive SSI Insulin requirements in the past 24 hours: 1 unit Lytes: K 3.9 (goal >/=4 w/ ileus), others WNL and stable Renal: SCr stable wnl, UOP appears inaccurate. D5W1/2NS+20K at 10 ml/hr Pulm: RA Cards: no issues Hepatobil: LFTs/Tbili/TG wnl Neuro: fent prn ID: no issues  Best Practices: hep SQ TPN Access: PICC TPN start date: 11/16/2016  Nutritional Goals (per RD recommendations 9/26): KCal: 1700 - 1900 kcal/day Protein: 90 - 105 g/day  Current Nutrition:  Clear liquids Clinimix + IVFE  Plan:  Continue Clinimix E 5/15 at 35ml/hr Continue 20% lipid emulsion at 20 ml/hr Continue D5W1/2NS+20K at 10 ml/hr per MD  This provides 90 g of protein and 1758 kCals per day - meeting 100% of protein and kCal needs Add MVI and trace elements in TPN D/c SSI due to lack of need so far and CBGs controlled Monitor TPN labs F/U resolution of ileus, toleration of diet and ability to wean TPN  Supplement K run x 1   Elicia Lamp, PharmD, BCPS Clinical Pharmacist Rx Phone # for today:  [phone] After 3:30PM, please call Main Rx: [phone] 11/18/2016 9:27 AM

## 2016-11-19 LAB — URINALYSIS, ROUTINE W REFLEX MICROSCOPIC
Bacteria, UA: NONE SEEN
Bilirubin Urine: NEGATIVE
Glucose, UA: NEGATIVE mg/dL
Hgb urine dipstick: NEGATIVE
Ketones, ur: NEGATIVE mg/dL
Leukocytes, UA: NEGATIVE
Nitrite: NEGATIVE
Protein, ur: NEGATIVE mg/dL
Specific Gravity, Urine: 1.008 (ref 1.005–1.030)
pH: 6 (ref 5.0–8.0)

## 2016-11-19 LAB — BASIC METABOLIC PANEL
Anion gap: 4 — ABNORMAL LOW (ref 5–15)
BUN: 17 mg/dL (ref 6–20)
CO2: 27 mmol/L (ref 22–32)
Calcium: 8.5 mg/dL — ABNORMAL LOW (ref 8.9–10.3)
Chloride: 104 mmol/L (ref 101–111)
Creatinine, Ser: 0.71 mg/dL (ref 0.44–1.00)
GFR calc Af Amer: >60 mL/min (ref >60)
GFR calc non Af Amer: >60 mL/min (ref >60)
Glucose, Bld: 114 mg/dL — ABNORMAL HIGH (ref 65–99)
Potassium: 4.2 mmol/L (ref 3.5–5.1)
Sodium: 135 mmol/L (ref 135–145)

## 2016-11-19 LAB — PHOSPHORUS: Phosphorus: 3.3 mg/dL (ref 2.5–4.6)

## 2016-11-19 LAB — MAGNESIUM: Magnesium: 2.2 mg/dL (ref 1.7–2.4)

## 2016-11-19 MED ORDER — TRACE MINERALS CR-CU-MN-SE-ZN 10-1000-500-60 MCG/ML IV SOLN
INTRAVENOUS | Status: AC
  Administered 2016-11-19: 19:00:00 via INTRAVENOUS
  Filled 2016-11-19: qty 1800

## 2016-11-19 MED ORDER — FAT EMULSION 20 % IV EMUL
240.0000 mL | INTRAVENOUS | Status: AC
  Administered 2016-11-19: 240 mL via INTRAVENOUS
  Filled 2016-11-19: qty 250

## 2016-11-19 NOTE — Progress Notes (Signed)
[place] NOTE   Pharmacy Consult for TPN Indication: prolonged ileus  Patient Measurements: Height: 5\' 3"  (160 cm) Weight: 149 lb 14.6 oz (68 kg) IBW/kg (Calculated) : 52.4 TPN AdjBW (KG): 56.3 Body mass index is 26.56 kg/m.  Usual body weight: ~150 lb per patient  Assessment: 66 yof with new stage IV colon cancer, increasing issues with having BMs, now s/p colectomy 9/19. Usually with good appetite but notes slight decrease in intake over last 3-4 weeks. Pharmacy consulted to start TPN with post-op ileus.  PMH: Fe-deficiency anemia, HTN, HLD, colon cancer, GERD, CAD, CVA  GI: Ileus improving. Intractable N/V initially. NGT removed 9/26 with no N/V. Having flatus/BMs. Per Surgery on 9/27 - "would continue TPN until day prior to d/c due to wt loss and ileus." Started clear liquids 9/27. Pre-albumin 13.8. PPI IV Endo: Pre-DM (A1c 6.3). CBGs controlled - sensitive SSI Insulin requirements in the past 24 hours: 0 unit Lytes: K 4.2 (goal >/=4 w/ ileus), others WNL and stable Renal: SCr stable wnl, UOP appears inaccurate. D5W1/2NS+20K at 10 ml/hr Pulm: RA Cards: no issues Hepatobil: LFTs/Tbili/TG wnl Neuro: fent prn; refusing gabapentin ID: no issues  Best Practices: hep SQ TPN Access: PICC TPN start date: 11/16/2016  Nutritional Goals (per RD recommendations 9/25): KCal: 1700 - 1900 kcal/day Protein: 90 - 105 g/day  Current Nutrition:  Clear liquids (initiated 9/27) Clinimix + IVFE  Plan:  Continue Clinimix E 5/15 at 110ml/hr Continue 20% lipid emulsion at 20 ml/hr Continue D5W1/2NS+20K at 10 ml/hr per MD  This provides 90 g of protein and 1758 kCals per day - meeting 100% of protein and kCal needs Add MVI and trace elements in TPN Monitor TPN labs F/U resolution of ileus, toleration of diet and ability to wean TPN   Sloan Leiter, PharmD, BCPS Clinical Pharmacist Clinical phone 11/19/2016 until 4PM [phone] After hours,  please call #[phone] 11/19/2016 8:19 AM

## 2016-11-19 NOTE — Progress Notes (Signed)
[facility] Progress Note  9 Days Post-Op  Subjective: CC:  Tolerated clears.  Still with some belching.  Appetite still poor.    Objective: Vital signs in last 24 hours: Temp:  [97.5 F (36.4 C)-98.2 F (36.8 C)] 98.2 F (36.8 C) (09/28 0505) Pulse Rate:  [73-78] 78 (09/28 0505) Resp:  [18] 18 (09/28 0505) BP: (91-126)/(52-62) 126/62 (09/28 0505) SpO2:  [94 %-98 %] 98 % (09/28 0505) Last BM Date: 11/18/16  Intake/Output from previous day: 09/27 0701 - 09/28 0700 In: 1649.3 [P.O.:310; I.V.:1339.3] Out: 1225 [Urine:1225] Intake/Output this shift: No intake/output data recorded.  PE: Gen:  Alert, NAD, pleasant, able to get to bedside commode with minimal assistance.   Pulm:  Normal effort, Abd: Soft, appropriately tender, non distended, midline incisions c/d/i w honeycomb in place.  No erythema Skin: warm and dry. Psych: A&Ox3   Lab Results:  No results for input(s): WBC, HGB, HCT, PLT in the last 72 hours. BMET  Recent Labs  11/18/16 0405 11/19/16 0418  NA 135 135  K 3.9 4.2  CL 103 104  CO2 26 27  GLUCOSE 113* 114*  BUN 14 17  CREATININE 0.76 0.71  CALCIUM 8.4* 8.5*   PT/INR No results for input(s): LABPROT, INR in the last 72 hours. CMP     Component Value Date/Time   NA 135 11/19/2016 0418   NA 141 09/23/2016 1256   K 4.2 11/19/2016 0418   K 4.1 09/23/2016 1256   CL 104 11/19/2016 0418   CO2 27 11/19/2016 0418   CO2 27 09/23/2016 1256   GLUCOSE 114 (H) 11/19/2016 0418   GLUCOSE 94 09/23/2016 1256   BUN 17 11/19/2016 0418   BUN 15.2 09/23/2016 1256   CREATININE 0.71 11/19/2016 0418   CREATININE 0.8 09/23/2016 1256   CALCIUM 8.5 (L) 11/19/2016 0418   CALCIUM 9.3 09/23/2016 1256   PROT 5.8 (L) 11/18/2016 0405   PROT 6.9 09/23/2016 1256   ALBUMIN 2.6 (L) 11/18/2016 0405   ALBUMIN 3.6 09/23/2016 1256   AST 26 11/18/2016 0405   AST 22 09/23/2016 1256   ALT 22 11/18/2016 0405   ALT 20 09/23/2016 1256   ALKPHOS 69 11/18/2016 0405    ALKPHOS 94 09/23/2016 1256   BILITOT 0.4 11/18/2016 0405   BILITOT 0.70 09/23/2016 1256   GFRNONAA >60 11/19/2016 0418   GFRAA >60 11/19/2016 0418   Lipase  No results found for: LIPASE     Studies/Results: No results found.  Anti-infectives: Anti-infectives    Start     Dose/Rate Route Frequency Ordered Stop   11/10/16 1400  clindamycin (CLEOCIN) IVPB 900 mg     900 mg 100 mL/hr over 30 Minutes Intravenous Every 8 hours 11/10/16 1342 11/10/16 1505   11/10/16 0815  gentamicin (GARAMYCIN) 340 mg in dextrose 5 % 50 mL IVPB     5 mg/kg  68 kg 117 mL/hr over 30 Minutes Intravenous To Surgery 11/10/16 0816 11/10/16 0844   11/10/16 0808  clindamycin (CLEOCIN) 900 MG/50ML IVPB    Comments:  Trixie Deis   : cabinet override      11/10/16 0808 11/10/16 2014       Assessment/Plan Transverse colon cancer, ascending colon polyp POD#9 S/P LAPAROSCOPIC EXTENDED RIGHT COLECTOMY (Right) 11/10/16 [name]             - post-op ileus, continues to improve             - Start clears today HTN: PRN meds ID: clindamycin  9/19 surgical prophylaxis VTE: SCD's, subcutaneous heparin  Foley: removed 9/21  TNA for prolonged ileus.  Would leave on until day prior to d/c given weight loss and pt ileus.   Advancing to fulls, but still pt with low caloric intake.   Ambulate    LOS: 9 days    [place] , [facility] 11/19/2016, 8:55 AM

## 2016-11-20 MED ORDER — FAT EMULSION 20 % IV EMUL
240.0000 mL | INTRAVENOUS | Status: AC
  Administered 2016-11-20: 240 mL via INTRAVENOUS
  Filled 2016-11-20: qty 250

## 2016-11-20 MED ORDER — IBUPROFEN 600 MG PO TABS: 600.0000 mg | ORAL_TABLET | Freq: Three times a day (TID) | ORAL | Status: DC | PRN

## 2016-11-20 MED ORDER — M.V.I. ADULT IV INJ
INJECTION | INTRAVENOUS | Status: AC
  Administered 2016-11-20: 18:00:00 via INTRAVENOUS
  Filled 2016-11-20: qty 1800

## 2016-11-20 NOTE — Progress Notes (Signed)
[facility] NOTE   Pharmacy Consult for TPN Indication: prolonged ileus  Patient Measurements: Height: 5\' 3"  (160 cm) Weight: 149 lb 14.6 oz (68 kg) IBW/kg (Calculated) : 52.4 TPN AdjBW (KG): 56.3 Body mass index is 26.56 kg/m.  Usual body weight: ~150 lb per patient  Assessment: 69 yof with new stage IV colon cancer, increasing issues with having BMs, now s/p colectomy 9/19. Usually with good appetite but notes slight decrease in intake over last 3-4 weeks. Pharmacy consulted to start TPN with post-op ileus.  PMH: Fe-deficiency anemia, HTN, HLD, colon cancer, GERD, CAD, CVA  GI: Ileus improving. Intractable N/V initially. NGT removed 9/26 with no N/V. Having flatus/BMs. Per Surgery on 9/27 - "would continue TPN until day prior to d/c due to wt loss and ileus." Started clear liquids 9/27- tolerating but some intermittent nausea. Pre-albumin 13.8. PPI IV.  Endo: Pre-DM (A1c 6.3). CBGs controlled - sensitive SSI Insulin requirements in the past 24 hours: 0 unit Lytes: K 4.2 (goal >/=4 w/ ileus), others WNL and stable Renal: SCr stable wnl, UOP appears inaccurate. D5W1/2NS+20K at 10 ml/hr Pulm: RA Cards: no issues Hepatobil: LFTs/Tbili/TG wnl Neuro: fent prn; refusing gabapentin ID: no issues  Best Practices: hep SQ TPN Access: PICC TPN start date: 11/16/2016  Nutritional Goals (per RD recommendations 9/25): KCal: 1700 - 1900 kcal/day Protein: 90 - 105 g/day  Current Nutrition:  Clear liquids (initiated 9/27) Clinimix + IVFE  Plan:  Continue Clinimix E 5/15 at 45ml/hr Continue 20% lipid emulsion at 20 ml/hr Continue D5W1/2NS+20K at 10 ml/hr per MD  This provides 90 g of protein and 1758 kCals per day - meeting 100% of protein and kCal needs Add MVI and trace elements in TPN Monitor TPN labs F/U resolution of ileus, toleration of diet and ability to wean TPN   Sloan Leiter, PharmD, BCPS Clinical Pharmacist Clinical phone  11/20/2016 until 4PM [phone] After hours, please call #[phone] 11/20/2016 8:03 AM

## 2016-11-20 NOTE — Progress Notes (Signed)
10 Days Post-Op    CC: tremors:/Ascending colon polyp  Subjective: Tolerating full liquids, but still having some intermittent nausea. She says her by mouth intake is poor. She is having loose stools now. Reports she feels very weak.  Objective: Vital signs in last 24 hours: Temp:  [98.1 F (36.7 C)-98.5 F (36.9 C)] 98.5 F (36.9 C) (09/29 0522) Pulse Rate:  [68-92] 78 (09/29 0522) Resp:  [18] 18 (09/29 0522) BP: (88-139)/(52-73) 139/52 (09/29 0522) SpO2:  [98 %-100 %] 99 % (09/29 0522) Last BM Date: 11/19/16 PO 1200 IV 2100 BM x 6 Afebrile, VSS, BP down x 1 Afebrile, VSS  Intake/Output from previous day: 09/28 0701 - 09/29 0700 In: 3336.5 [P.O.:1200; I.V.:2136.5] Out: 1050 [Urine:1050] Intake/Output this shift: No intake/output data recorded.  General appearance: alert, cooperative and no distress Resp: clear to auscultation bilaterally GI: soft, waffle dressing in place incision looks fine. Bowel sounds are still very hypoactive. Positive BM. Extremities: extremities normal, atraumatic, no cyanosis or edema  Lab Results:  No results for input(s): WBC, HGB, HCT, PLT in the last 72 hours.  BMET  Recent Labs  11/18/16 0405 11/19/16 0418  NA 135 135  K 3.9 4.2  CL 103 104  CO2 26 27  GLUCOSE 113* 114*  BUN 14 17  CREATININE 0.76 0.71  CALCIUM 8.4* 8.5*   PT/INR No results for input(s): LABPROT, INR in the last 72 hours.   Recent Labs Lab 11/16/16 0553 11/18/16 0405  AST 15 26  ALT 12* 22  ALKPHOS 62 69  BILITOT 0.7 0.4  PROT 5.1* 5.8*  ALBUMIN 2.5* 2.6*     Lipase  No results found for: LIPASE   Prior to Admission medications   Medication Sig Start Date End Date Taking? Authorizing Provider  aspirin 81 MG tablet Take 81 mg by mouth daily.   Yes [provider]  atorvastatin (LIPITOR) 40 MG tablet Take 1 tablet (40 mg total) by mouth daily at 6 PM. 07/30/16  Yes Tower, [name], MD  b complex vitamins tablet Take 1 tablet by mouth  daily.   Yes [provider]  Ca Carbonate-Mag Hydroxide (ROLAIDS PO) Take 1 tablet by mouth as needed (for acid reflux).   Yes [provider]  cholecalciferol (VITAMIN D) 1000 units tablet Take 2,000 Units by mouth daily.    Yes [provider]  COMBIGAN 0.2-0.5 % ophthalmic solution Place 1 drop into both eyes at bedtime.    Yes [provider]  LUMIGAN 0.01 % SOLN Place 1 drop into both eyes daily.    Yes [provider]  Multiple Vitamin (MULTIVITAMIN WITH MINERALS) TABS tablet Take 1 tablet by mouth daily.   Yes [provider]  valACYclovir (VALTREX) 500 MG tablet TAKE 1 TABLET (500 MG TOTAL) BY MOUTH 2 (TWO) TIMES DAILY. Patient not taking: Reported on 11/03/2016 10/07/16   Abner Greenspan, MD    Medications: . atorvastatin  40 mg Oral q1800  . brimonidine  1 drop Both Eyes QHS   And  . timolol  1 drop Both Eyes QHS  . gabapentin  100 mg Oral BID  . heparin subcutaneous  5,000 Units Subcutaneous Q8H  . Influenza vac split quadrivalent PF  0.5 mL Intramuscular Tomorrow-1000  . latanoprost  1 drop Both Eyes QHS  . mouth rinse  15 mL Mouth Rinse BID  . pantoprazole (PROTONIX) IV  40 mg Intravenous Q24H   . dextrose 5 % and 0.45 % NaCl with KCl  20 mEq/L 10 mL/hr at 11/17/16 1610  . methocarbamol (ROBAXIN)  IV 500 mg (11/12/16 0530)  . Marland KitchenTPN (CLINIMIX-E) Adult 75 mL/hr at 11/19/16 1830   Anti-infectives    Start     Dose/Rate Route Frequency Ordered Stop   11/10/16 1400  clindamycin (CLEOCIN) IVPB 900 mg     900 mg 100 mL/hr over 30 Minutes Intravenous Every 8 hours 11/10/16 1342 11/10/16 1505   11/10/16 0815  gentamicin (GARAMYCIN) 340 mg in dextrose 5 % 50 mL IVPB     5 mg/kg  68 kg 117 mL/hr over 30 Minutes Intravenous To Surgery 11/10/16 0816 11/10/16 0844   11/10/16 0808  clindamycin (CLEOCIN) 900 MG/50ML IVPB    Comments:  Trixie Deis   : cabinet override      11/10/16 0808 11/10/16 2014      Assessment/Plan Colon  cancer with liver mass S/p laparoscopic extended right hemicolectomy, 11/10/16, Dr. [name] Malnutrition - prealbumin 13.8/TNA FEN:  TNA/Full liquids ID:  Pre op only DVT:  Heparin    plan: She is improving after her ileus. By mouth intake is still poor.I'm going to advance her diet so she can take more in.We'll start her on a calorie count to see how much she is actually taking in. [name] wanted to leave her on the TNA till the day prior to DC.she does not like narcotics will switch her over to some by mouth pain medications.    LOS: 10 days    Nesanel Aguila 11/20/2016 [phone]

## 2016-11-20 NOTE — Progress Notes (Signed)
[place] Count Note.   Calorie Count initiated.   RD placed envelope on door w/ count instructions. Placed instructions in nursing orders and verified nursing was aware  RD informed patient of Calorie Count orders. She has been tolerating full liquids x1 day, but having intermittent nausea. Diet has been advanced to Soft today. She ate ~75% of her breakfast. She said she would have eaten more, but the food didn't taste good. She has tolerated it well so far.   RD offered supplements to help her wean from the TPN, but she declined. RD showed her menu and number to call to place orders. Took note of some extras she would like on her trays-> icecream, yogurt, mashed potatoes. Will pass requests to dietary.  Burtis Junes RD, LDN, CNSC Clinical Nutrition Pager: [phone] 11/20/2016 10:17 AM

## 2016-11-20 NOTE — Progress Notes (Signed)
Pt ambulated several times during the shift. No complaints of pain.

## 2016-11-20 NOTE — Progress Notes (Signed)
Notified Dr Ninfa Linden patient's BP 96/48 and HR 86. Ninfa Linden returned call and acknowledged vital signs; no further orders given at this time.

## 2016-11-21 LAB — PREALBUMIN: Prealbumin: 14.3 mg/dL — ABNORMAL LOW (ref 18–38)

## 2016-11-21 MED ORDER — TRACE MINERALS CR-CU-MN-SE-ZN 10-1000-500-60 MCG/ML IV SOLN
INTRAVENOUS | Status: AC
  Administered 2016-11-21: 18:00:00 via INTRAVENOUS
  Filled 2016-11-21: qty 960

## 2016-11-21 NOTE — Care Management Note (Signed)
Case Management Note  Patient Details  Name: [name] MRN: [id number] Date of Birth: 1940-03-03  Subjective/Objective:  76 y.o. F  11D Post op  Lap Extended R Hemi Colectomy.   Post op ileus resolving with anticipated d/c of TPN 11/22/2016 per Pharm. Ambulates with Nursing w/o issue. Pt feels safer with Cane and 3n1 which MD has ordered and CM notified W/E DME rep ([name]) with [facility]. Patient ex[ressed concerns re: Oxygen dependent spouse at home which she will be unable to care for; [name], [facility] will follow on 11/22/16 as she had given pt Private Duty List on 11/15/2016 for her to use during her recovery. No further needs at this time. No HH needed for this pt                Action/Plan: Will defer additional needs to final Discharge planner , no additional needs anticipated.    Expected Discharge Date:                  Expected Discharge Plan:  Home/Self Care  In-House Referral:     Discharge planning Services  CM Consult  Post Acute Care Choice:  Durable Medical Equipment Choice offered to:  Patient  DME Arranged:  3-N-1, [name] DME Agency:  [place]:    [facility] Agency:     Status of Service:  In process, will continue to follow  If discussed at Long Length of Stay Meetings, dates discussed:    Additional Comments:  Delrae Sawyers, RN 11/21/2016, 8:56 AM

## 2016-11-21 NOTE — Progress Notes (Signed)
PHARMACY - ADULT TOTAL PARENTERAL NUTRITION CONSULT NOTE   Pharmacy Consult for TPN Indication: prolonged ileus  Patient Measurements: Height: 5\' 3"  (160 cm) Weight: 148 lb 5.9 oz (67.3 kg) IBW/kg (Calculated) : 52.4 TPN AdjBW (KG): 56.3 Body mass index is 26.28 kg/m.  Usual body weight: ~150 lb per patient  Assessment: 52 yof with new stage IV colon cancer, increasing issues with having BMs, now s/p colectomy 9/19. Usually with good appetite but notes slight decrease in intake over last 3-4 weeks. Pharmacy consulted to start TPN with post-op ileus.  PMH: Fe-deficiency anemia, HTN, HLD, colon cancer, GERD, CAD, CVA  GI: Ileus improving. Intractable N/V initially. NGT removed 9/26 with no N/V. Having flatus/BMs. Per Surgery on 9/27 - "would continue TPN until day prior to d/c due to wt loss and ileus." Tolerating soft diet but some intermittent nausea. Pre-albumin 13.8. PPI IV.  Endo: Pre-DM (A1c 6.3). CBGs controlled - sensitive SSI Insulin requirements in the past 24 hours: 0 unit Lytes: K 4.2 (goal >/=4 w/ ileus), others WNL and stable Renal: SCr stable wnl, UOP appears inaccurate. D5W1/2NS+20K at 10 ml/hr Pulm: RA Cards: no issues Hepatobil: LFTs/Tbili/TG wnl Neuro: fent prn; refusing gabapentin ID: no issues  Best Practices: hep SQ TPN Access: PICC TPN start date: 11/16/2016  Nutritional Goals (per RD recommendations 9/25): KCal: 1700 - 1900 kcal/day Protein: 90 - 105 g/day  Current Nutrition:  Soft diet (75% intake recorded; calorie count in progress) Clinimix + IVFE  Plan:  Wean Clinimix E 5/15 to 40 mL/hr tonight.   No further lipids as eating soft diet well and weaning off TPN. Continue D5W1/2NS+20K at 10 ml/hr per MD  TPN provides 48g of protein and 682 kCals per day - meeting 50% of protein and kCal needs. Add MVI and trace elements in TPN Monitor TPN labs F/U toleration of diet and ability to discontinue TPN tomorrow.   [name], PharmD,  BCPS Clinical Pharmacist Clinical phone 11/21/2016 until 4PM [phone] After hours, please call #[phone] 11/21/2016 7:47 AM

## 2016-11-21 NOTE — Progress Notes (Signed)
11 Days Post-Op    CC: colorectal cancer/Ascending colon polyp  Subjective    she has multiple complaints about the food,what she does take if she is tolerating well. Having multiple bowel movements and doesn't seem to remember that. She seems somewhat anxious about going home, her husband is on O2. Nurses report she ambulated several times yesterday without any significant issues.  Objective: Vital signs in last 24 hours: Temp:  [98.4 F (36.9 C)-98.9 F (37.2 C)] 98.4 F (36.9 C) (09/30 0453) Pulse Rate:  [79-86] 79 (09/30 0453) Resp:  [17] 17 (09/30 0453) BP: (96-118)/(48-57) 114/57 (09/30 0453) SpO2:  [98 %-99 %] 99 % (09/30 0453) Weight:  [67.3 kg (148 lb 5.9 oz)] 67.3 kg (148 lb 5.9 oz) (09/30 0453) Last BM Date: 11/20/16 402 PO - calorie count ordered 1754 IV 750 urine recorded BM x 5 Afebrile, BP still low at times No labs today  Intake/Output from previous day: 09/29 0701 - 09/30 0700 In: 2156.5 [P.O.:402; I.V.:1754.5] Out: 750 [Urine:750] Intake/Output this shift: No intake/output data recorded.  General appearance: alert, cooperative and no distress Resp: clear to auscultation bilaterally GI: soft, normal postoperative discomfort. Wounds healing nicely few bowel sounds, no distention having bowel movements.  Lab Results:  No results for input(s): WBC, HGB, HCT, PLT in the last 72 hours.  BMET  Recent Labs  11/19/16 0418  NA 135  K 4.2  CL 104  CO2 27  GLUCOSE 114*  BUN 17  CREATININE 0.71  CALCIUM 8.5*   PT/INR No results for input(s): LABPROT, INR in the last 72 hours.   Recent Labs Lab 11/16/16 0553 11/18/16 0405  AST 15 26  ALT 12* 22  ALKPHOS 62 69  BILITOT 0.7 0.4  PROT 5.1* 5.8*  ALBUMIN 2.5* 2.6*     Lipase  No results found for: LIPASE   Medications: . atorvastatin  40 mg Oral q1800  . brimonidine  1 drop Both Eyes QHS   And  . timolol  1 drop Both Eyes QHS  . gabapentin  100 mg Oral BID  . heparin subcutaneous  5,000  Units Subcutaneous Q8H  . Influenza vac split quadrivalent PF  0.5 mL Intramuscular Tomorrow-1000  . latanoprost  1 drop Both Eyes QHS  . mouth rinse  15 mL Mouth Rinse BID  . pantoprazole (PROTONIX) IV  40 mg Intravenous Q24H    Assessment/Plan Colon cancer with liver mass S/p laparoscopic extended right hemicolectomy, 11/10/16, Dr. [name]  POD 11 Malnutrition - prealbumin 13.8/TNA FEN:  TNA/soft diet ID:  Pre op only DVT:  Heparin    plan:I will leave her on a soft diet start weaning the TNA. I told her to let her family know she is closest coming home. Recheck labs in a.m. Calorie count is in progress. I'll also check orthostatics at least once today to make sure the pressure drops we are seeing are symptomatic.   LOS: 11 days    Marquin Patino 11/21/2016 [phone]

## 2016-11-22 LAB — COMPREHENSIVE METABOLIC PANEL
ALT: 22 U/L (ref 14–54)
AST: 16 U/L (ref 15–41)
Albumin: 2.6 g/dL — ABNORMAL LOW (ref 3.5–5.0)
Alkaline Phosphatase: 74 U/L (ref 38–126)
Anion gap: 4 — ABNORMAL LOW (ref 5–15)
BUN: 18 mg/dL (ref 6–20)
CO2: 27 mmol/L (ref 22–32)
Calcium: 8.6 mg/dL — ABNORMAL LOW (ref 8.9–10.3)
Chloride: 105 mmol/L (ref 101–111)
Creatinine, Ser: 0.75 mg/dL (ref 0.44–1.00)
GFR calc Af Amer: >60 mL/min (ref >60)
GFR calc non Af Amer: >60 mL/min (ref >60)
Glucose, Bld: 122 mg/dL — ABNORMAL HIGH (ref 65–99)
Potassium: 4.2 mmol/L (ref 3.5–5.1)
Sodium: 136 mmol/L (ref 135–145)
Total Bilirubin: 0.5 mg/dL (ref 0.3–1.2)
Total Protein: 5.5 g/dL — ABNORMAL LOW (ref 6.5–8.1)

## 2016-11-22 LAB — CBC WITH DIFFERENTIAL/PLATELET
Basophils Absolute: 0 10*3/uL (ref 0.0–0.1)
Basophils Relative: 0 %
Eosinophils Absolute: 0.2 10*3/uL (ref 0.0–0.7)
Eosinophils Relative: 3 %
HCT: 30.2 % — ABNORMAL LOW (ref 36.0–46.0)
Hemoglobin: 9.5 g/dL — ABNORMAL LOW (ref 12.0–15.0)
Lymphocytes Relative: 22 %
Lymphs Abs: 1.8 10*3/uL (ref 0.7–4.0)
MCH: 27.1 pg (ref 26.0–34.0)
MCHC: 31.5 g/dL (ref 30.0–36.0)
MCV: 86 fL (ref 78.0–100.0)
Monocytes Absolute: 0.9 10*3/uL (ref 0.1–1.0)
Monocytes Relative: 11 %
Neutro Abs: 5.3 10*3/uL (ref 1.7–7.7)
Neutrophils Relative %: 64 %
Platelets: 231 10*3/uL (ref 150–400)
RBC: 3.51 MIL/uL — ABNORMAL LOW (ref 3.87–5.11)
RDW: 14.2 % (ref 11.5–15.5)
WBC: 8.3 10*3/uL (ref 4.0–10.5)

## 2016-11-22 LAB — PHOSPHORUS: Phosphorus: 3.3 mg/dL (ref 2.5–4.6)

## 2016-11-22 LAB — MAGNESIUM: Magnesium: 2 mg/dL (ref 1.7–2.4)

## 2016-11-22 LAB — PREALBUMIN: Prealbumin: 15.8 mg/dL — ABNORMAL LOW (ref 18–38)

## 2016-11-22 LAB — TRIGLYCERIDES: Triglycerides: 122 mg/dL (ref <150)

## 2016-11-22 MED ORDER — GABAPENTIN 100 MG PO CAPS: 100.0000 mg | ORAL_CAPSULE | Freq: Two times a day (BID) | ORAL | 1 refills | Status: DC | PRN

## 2016-11-22 MED ORDER — ALTEPLASE 2 MG IJ SOLR
2.0000 mg | Freq: Once | INTRAMUSCULAR | Status: AC
  Administered 2016-11-22: 2 mg
  Filled 2016-11-22: qty 2

## 2016-11-22 MED ORDER — PANTOPRAZOLE SODIUM 40 MG PO TBEC: 40.0000 mg | DELAYED_RELEASE_TABLET | Freq: Every day | ORAL | Status: DC

## 2016-11-22 NOTE — Progress Notes (Signed)
Calorie Count Note  48 hour calorie count ordered.  Diet: soft  TPN began weaning on 11/21/16. Will d/c TPN today. Noted pt with active discharge orders.   Day 1 Breakfast: 373 kcals, 8 grams protein Lunch: 510 kcals, 20 grams protein Dinner: 223 kcals, 6 grams protein  Total intake: 1106 kcal (65% of minimum estimated needs)  34 grams protein (38% of minimum estimated needs)  Day 2 Breakfast: 443 kcals, 21 grams protein Lunch: 240 kcals, 10 grams protein Dinner: 273 kcals, 19 grams protein  Total intake: 956 kcal (56% of minimum estimated needs)  50 grams protein (56% of minimum estimated needs)  Average Total intake: 1031 kcal (61% of minimum estimated needs)  42 grams protein (47% of minimum estimated needs)  Nutrition Dx: Inadequate oral intake related to altered GI function as evidenced by NPO status; resolved  Goal: Patient will meet greater than or equal to 90% of their needs; progressing  Intervention: Pt will be discharged home today. Intake is improving, suspect pt will eat better at home. Recommend addition of nutritional supplements if intake remains poor.   Shamra Bradeen A. Jimmye Norman, RD, LDN, CDE Pager: [phone] After hours Pager: [phone]

## 2016-11-22 NOTE — Discharge Summary (Signed)
Physician Discharge Summary  Patient ID: [name] MRN: [id number] DOB/AGE: 1941/02/02 76 y.o.  Admit date: 11/10/2016 Discharge date: 11/22/2016  Admission Diagnoses: Patient Active Problem List   Diagnosis Date Noted  . Colon cancer ([place]) 11/10/2016  . Cancer of overlapping sites of colon ([place]) 09/23/2016  . Iron deficiency anemia due to chronic blood loss 09/23/2016  . Estrogen deficiency 07/30/2016  . Screening mammogram, encounter for 07/30/2016  . Routine general medical examination at a health care facility 07/27/2016  . Right buttock pain 06/27/2015  . Accidental fall 06/27/2015  . Left hip pain 06/27/2015  . Stroke (cerebrum) ([place]) 05/06/2015  . HTN (hypertension) 05/06/2015  . HLD (hyperlipidemia) 05/06/2015  . GERD (gastroesophageal reflux disease) 05/06/2015  . CAD (coronary artery disease) 05/06/2015  . Insect bites 09/17/2014  . Rectal bleeding 09/25/2013  . Joint pain 05/01/2013  . Tick bite 05/01/2013  . Pain in joint, ankle and foot 03/02/2013  . Seborrheic keratosis 07/05/2012  . Ankle joint pain 07/16/2011  . 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 Callus of foot 10/16/2010  . Coronary artery disease, non-occlusive 10/16/2010  . INSOMNIA 02/02/2010  . HAND PAIN, BILATERAL 01/09/2010  . CRAMP OF LIMB 01/09/2010  . HYPERGLYCEMIA 07/29/2009  . ALLERGIC RHINITIS 12/04/2008  . Vitamin D deficiency 04/30/2008  . COLONIC POLYPS 06/28/2007  . IBS 03/17/2007  . INTERSTITIAL CYSTITIS 03/17/2007  . Osteoporosis 03/17/2007    Discharge Diagnoses:  Active Problems:   Colon cancer ([place]) and same. Prolonged ileus  Discharged Condition: stable  Hospital Course:  Pt was admitted to the floor following extended right hemicolectomy for synchronous colon cancers.  The patient initially did well, but developed a significant ileus with bloating and nausea/vomiting on POD 3-4.  She eventually began to pass gas and have stools, but still had >1L bilious NGT output.   Eventually this output went down and she was able to have NGT removed.  She was placed on TNA during this time.  She was able to tolerate slow diet advance.  Initially she was eating poorly, but this improved to the point that she was taking 50-100% of all meal/snacks.  She is discharged in stable condition to home with [facility] aide if needed.  She is ambulating independently.    Consults: None  Significant Diagnostic Studies: labs: see epic.  Last WBC 8.3k, Last HCT 30.2%, Cr 0.75  Treatments: TPN and surgery: see above  Discharge Exam: Blood pressure (!) 98/50, pulse 75, temperature 98.5 F (36.9 C), temperature source Oral, resp. rate 16, height 5\' 3"  (1.6 m), weight 70.6 kg (155 lb 9.6 oz), SpO2 97 %. General appearance: alert, cooperative and no distress Resp: breathing comfortably GI: soft, non distended, non tender, wound c/d/i. no erythema. Extremities: extremities normal, atraumatic, no cyanosis or edema  Disposition: 01-Home or Self Care  Discharge Instructions    Call MD for:  difficulty breathing, headache or visual disturbances    Complete by:  As directed    Call MD for:  persistant nausea and vomiting    Complete by:  As directed    Call MD for:  redness, tenderness, or signs of infection (pain, swelling, redness, odor or green/yellow discharge around incision site)    Complete by:  As directed    Call MD for:  severe uncontrolled pain    Complete by:  As directed    Call MD for:  temperature >100.4    Complete by:  As directed    Diet - low sodium heart healthy  Complete by:  As directed    Increase activity slowly    Complete by:  As directed      Allergies as of 11/22/2016      Reactions   Ampicillin Other (See Comments)   Reaction:  Unknown    Codeine Nausea And Vomiting   Flagyl [metronidazole] Nausea And Vomiting   Nausea/vomiting    Meperidine Hcl Nausea And Vomiting   Penicillins Other (See Comments)   Reaction:  Unknown    Propoxyphene Hcl Nausea And  Vomiting   Sulfa Antibiotics Hives   Tramadol Hcl Nausea And Vomiting      Medication List    TAKE these medications   aspirin 81 MG tablet Take 81 mg by mouth daily.   atorvastatin 40 MG tablet Commonly known as:  LIPITOR Take 1 tablet (40 mg total) by mouth daily at 6 PM.   b complex vitamins tablet Take 1 tablet by mouth daily.   cholecalciferol 1000 units tablet Commonly known as:  VITAMIN D Take 2,000 Units by mouth daily.   COMBIGAN 0.2-0.5 % ophthalmic solution Generic drug:  brimonidine-timolol Place 1 drop into both eyes at bedtime.   gabapentin 100 MG capsule Commonly known as:  NEURONTIN Take 1 capsule (100 mg total) by mouth 2 (two) times daily as needed.   LUMIGAN 0.01 % Soln Generic drug:  bimatoprost Place 1 drop into both eyes daily.   multivitamin with minerals Tabs tablet Take 1 tablet by mouth daily.   ROLAIDS PO Take 1 tablet by mouth as needed (for acid reflux).   valACYclovir 500 MG tablet Commonly known as:  VALTREX TAKE 1 TABLET (500 MG TOTAL) BY MOUTH 2 (TWO) TIMES DAILY.            Durable Medical Equipment        Start     Ordered   11/15/16 1307  For home use only DME 3 n 1  Once     11/15/16 1307   11/15/16 1307  For home use only DME Cane  Once     11/15/16 1307   11/15/16 1306  For home use only DME Walker rolling  Once    Question:  Patient needs a walker to treat with the following condition  Answer:  Cancer of transverse colon ([place])   11/15/16 1307       Discharge Care Instructions        Start     Ordered   11/22/16 0000  gabapentin (NEURONTIN) 100 MG capsule  2 times daily PRN     11/22/16 1024   11/22/16 0000  Diet - low sodium heart healthy     11/22/16 1024   11/22/16 0000  Increase activity slowly     11/22/16 1024   11/22/16 0000  Call MD for:  temperature >100.4     11/22/16 1024   11/22/16 0000  Call MD for:  persistant nausea and vomiting     11/22/16 1024   11/22/16 0000  Call MD for:  severe  uncontrolled pain     11/22/16 1024   11/22/16 0000  Call MD for:  redness, tenderness, or signs of infection (pain, swelling, redness, odor or green/yellow discharge around incision site)     11/22/16 1024   11/22/16 0000  Call MD for:  difficulty breathing, headache or visual disturbances     11/22/16 1024     Follow-up Information    [name], MD Follow up in 2 week(s).   Specialty:  General Surgery Contact information: [address] [place] [address] [phone]           Signed: [name] 11/22/2016, 10:25 AM

## 2016-11-22 NOTE — Progress Notes (Addendum)
Discharge Note Pt discharged Home with Daughter in private vehicle. Discharge instructions discussed with patient.  Pt alert and oriented x4. Incision OTA , closed with surgical glue and no drainage noted

## 2016-11-22 NOTE — Progress Notes (Signed)
PHARMACY - ADULT TOTAL PARENTERAL NUTRITION CONSULT NOTE   Pharmacy Consult:  TPN Indication:  Prolonged ileus  Patient Measurements: Height: 5\' 3"  (160 cm) Weight: 155 lb 9.6 oz (70.6 kg) IBW/kg (Calculated) : 52.4 TPN AdjBW (KG): 56.3 Body mass index is 27.56 kg/m.  Usual body weight: ~150 lb per patient  Assessment:  19 YOF with new stage IV colon cancer, increasing issues with having BMs, now s/p colectomy 11/10/16. Usually with good appetite but notes slight decrease in intake over last 3-4 weeks. Pharmacy consulted to manage TPN with post-op ileus.  PMH: IDA, HTN, HLD, colon cancer, GERD, CAD, CVA  GI: ileus improving.  Prealbumin up 15.8, NGT removed 9/26. Having flatus/BMs. Tolerating soft diet but some intermittent nausea.  PPI IV.  Endo: pre-DM (A1c 6.3). CBGs well controlled Insulin requirements in the past 24 hours: 0 unit Lytes: all WNL Renal: SCr 0.75 stable, BUN WNL - D51/2NS 20K at 10 ml/hr Pulm: stable on RA Cards: VSS - Lipitor Hepatobil: LFTs / tbili / TG WNL Neuro: gabapentin, PRN ID: afebrile, WBC WNL - not on abx Best Practices: heparin SQ TPN Access: PICC TPN start date: 11/16/2016  Nutritional Goals (per RD recommendations 9/25): 1700 - 1900 kCal and 90 - 105 g/day  Current Nutrition:  Soft diet (75-90% intake recorded; calorie count in progress) TPN   Plan:  Reduce Clinimix E 5/15 to 20 ml/hr then stop in 2 hours D/C TPN labs and nursing care orders   [name] D. Mina Marble, PharmD, BCPS Pager:  [phone] 11/22/2016, 10:24 AM

## 2016-11-22 NOTE — Discharge Summary (Signed)
Discharge Note Pt discharged Home with Daughter in private vehicle. Discharge instructions discussed with patient.  Pt alert and oriented x4. Incision OTA , closed with surgical glue and no drainage noted

## 2016-11-22 NOTE — Progress Notes (Signed)
12 Days Post-Op    CC: colorectal cancer/Ascending colon polyp  Subjective Pt had good PO intake overnight and yesterday.  No n/v.  Continues to have stool/gas.    Objective: Vital signs in last 24 hours: Temp:  [98 F (36.7 C)-98.8 F (37.1 C)] 98.5 F (36.9 C) (10/01 0458) Pulse Rate:  [75-89] 75 (10/01 0458) Resp:  [16] 16 (09/30 1528) BP: (98-120)/(49-50) 98/50 (10/01 0458) SpO2:  [97 %-100 %] 97 % (10/01 0458) Weight:  [70.6 kg (155 lb 9.6 oz)] 70.6 kg (155 lb 9.6 oz) (10/01 0500) Last BM Date: 11/21/16 402 PO - calorie count ordered 1754 IV 750 urine recorded BM x 5 Afebrile, BP still low at times No labs today  Intake/Output from previous day: 09/30 0701 - 10/01 0700 In: 1291 [P.O.:578; I.V.:713] Out: 900 [Urine:900] Intake/Output this shift: Total I/O In: 340 [P.O.:320; I.V.:20] Out: -   General appearance: alert, cooperative and no distress Resp: breathing comfortably GI: soft, normal postoperative discomfort. Wounds healing nicely few bowel sounds, no distention having bowel movements.  Lab Results:   Recent Labs  11/22/16 0641  WBC 8.3  HGB 9.5*  HCT 30.2*  PLT 231    BMET  Recent Labs  11/22/16 0641  NA 136  K 4.2  CL 105  CO2 27  GLUCOSE 122*  BUN 18  CREATININE 0.75  CALCIUM 8.6*   PT/INR No results for input(s): LABPROT, INR in the last 72 hours.   Recent Labs Lab 11/16/16 0553 11/18/16 0405 11/22/16 0641  AST 15 26 16   ALT 12* 22 22  ALKPHOS 62 69 74  BILITOT 0.7 0.4 0.5  PROT 5.1* 5.8* 5.5*  ALBUMIN 2.5* 2.6* 2.6*     Lipase  No results found for: LIPASE   Medications: . atorvastatin  40 mg Oral q1800  . brimonidine  1 drop Both Eyes QHS   And  . timolol  1 drop Both Eyes QHS  . gabapentin  100 mg Oral BID  . heparin subcutaneous  5,000 Units Subcutaneous Q8H  . Influenza vac split quadrivalent PF  0.5 mL Intramuscular Tomorrow-1000  . latanoprost  1 drop Both Eyes QHS  . mouth rinse  15 mL Mouth Rinse BID   . pantoprazole (PROTONIX) IV  40 mg Intravenous Q24H    Assessment/Plan Colon cancer with liver mass S/p laparoscopic extended right hemicolectomy, 11/10/16, Dr. [name]  POD 11 Malnutrition - prealbumin 13.8/TNA FEN:  TNA/soft diet ID:  Pre op only DVT:  Heparin   D/c home. D/c picc D/c tna Doing well  Follow up with me and oncology.   LOS: 12 days    Elecia Serafin 11/22/2016 [phone]

## 2016-11-22 NOTE — Progress Notes (Signed)
Discharge instructions (including medications) discussed with and copy provided to patient/caregiver 

## 2016-11-23 ENCOUNTER — Emergency Department (HOSPITAL_COMMUNITY)
Admit: 2016-11-23 | Discharge: 2016-11-23 | Disposition: A | Discharge status: Home / Self Care | Payer: Medicare HMO | Attending: Emergency Medicine | Admitting: Emergency Medicine

## 2016-11-23 ENCOUNTER — Encounter (HOSPITAL_COMMUNITY): Payer: Self-pay | Admitting: Radiology

## 2016-11-23 ENCOUNTER — Other Ambulatory Visit: Payer: Self-pay

## 2016-11-23 ENCOUNTER — Emergency Department (HOSPITAL_COMMUNITY): Payer: Medicare HMO

## 2016-11-23 ENCOUNTER — Inpatient Hospital Stay (HOSPITAL_COMMUNITY)
Admission: EM | Admit: 2016-11-23 | Discharge: 2016-11-30 | DRG: 300 | Disposition: A | Discharge status: Home / Self Care | Payer: Medicare HMO | Attending: Family Medicine | Admitting: Family Medicine

## 2016-11-23 DIAGNOSIS — I10 Essential (primary) hypertension: Secondary | ICD-10-CM | POA: Diagnosis present

## 2016-11-23 DIAGNOSIS — Z86718 Personal history of other venous thrombosis and embolism: Secondary | ICD-10-CM | POA: Diagnosis not present

## 2016-11-23 DIAGNOSIS — R109 Unspecified abdominal pain: Secondary | ICD-10-CM

## 2016-11-23 DIAGNOSIS — C188 Malignant neoplasm of overlapping sites of colon: Secondary | ICD-10-CM | POA: Diagnosis present

## 2016-11-23 DIAGNOSIS — I824Y2 Acute embolism and thrombosis of unspecified deep veins of left proximal lower extremity: Secondary | ICD-10-CM

## 2016-11-23 DIAGNOSIS — I82432 Acute embolism and thrombosis of left popliteal vein: Secondary | ICD-10-CM | POA: Diagnosis present

## 2016-11-23 DIAGNOSIS — Z880 Allergy status to penicillin: Secondary | ICD-10-CM

## 2016-11-23 DIAGNOSIS — Z888 Allergy status to other drugs, medicaments and biological substances status: Secondary | ICD-10-CM

## 2016-11-23 DIAGNOSIS — M7989 Other specified soft tissue disorders: Secondary | ICD-10-CM

## 2016-11-23 DIAGNOSIS — E785 Hyperlipidemia, unspecified: Secondary | ICD-10-CM | POA: Diagnosis present

## 2016-11-23 DIAGNOSIS — M79609 Pain in unspecified limb: Secondary | ICD-10-CM

## 2016-11-23 DIAGNOSIS — K219 Gastro-esophageal reflux disease without esophagitis: Secondary | ICD-10-CM | POA: Diagnosis present

## 2016-11-23 DIAGNOSIS — I82492 Acute embolism and thrombosis of other specified deep vein of left lower extremity: Principal | ICD-10-CM | POA: Diagnosis present

## 2016-11-23 DIAGNOSIS — K567 Ileus, unspecified: Secondary | ICD-10-CM

## 2016-11-23 DIAGNOSIS — B009 Herpesviral infection, unspecified: Secondary | ICD-10-CM | POA: Diagnosis present

## 2016-11-23 DIAGNOSIS — K589 Irritable bowel syndrome without diarrhea: Secondary | ICD-10-CM | POA: Diagnosis present

## 2016-11-23 DIAGNOSIS — D509 Iron deficiency anemia, unspecified: Secondary | ICD-10-CM | POA: Diagnosis present

## 2016-11-23 DIAGNOSIS — N390 Urinary tract infection, site not specified: Secondary | ICD-10-CM | POA: Diagnosis present

## 2016-11-23 DIAGNOSIS — M810 Age-related osteoporosis without current pathological fracture: Secondary | ICD-10-CM | POA: Diagnosis present

## 2016-11-23 DIAGNOSIS — Z881 Allergy status to other antibiotic agents status: Secondary | ICD-10-CM

## 2016-11-23 DIAGNOSIS — I2510 Atherosclerotic heart disease of native coronary artery without angina pectoris: Secondary | ICD-10-CM | POA: Diagnosis present

## 2016-11-23 DIAGNOSIS — Z882 Allergy status to sulfonamides status: Secondary | ICD-10-CM

## 2016-11-23 DIAGNOSIS — R112 Nausea with vomiting, unspecified: Secondary | ICD-10-CM

## 2016-11-23 DIAGNOSIS — I82412 Acute embolism and thrombosis of left femoral vein: Secondary | ICD-10-CM | POA: Diagnosis present

## 2016-11-23 DIAGNOSIS — E860 Dehydration: Secondary | ICD-10-CM | POA: Diagnosis not present

## 2016-11-23 DIAGNOSIS — I82502 Chronic embolism and thrombosis of unspecified deep veins of left lower extremity: Secondary | ICD-10-CM | POA: Diagnosis not present

## 2016-11-23 DIAGNOSIS — Z8673 Personal history of transient ischemic attack (TIA), and cerebral infarction without residual deficits: Secondary | ICD-10-CM

## 2016-11-23 DIAGNOSIS — Z9049 Acquired absence of other specified parts of digestive tract: Secondary | ICD-10-CM

## 2016-11-23 DIAGNOSIS — Z91013 Allergy to seafood: Secondary | ICD-10-CM

## 2016-11-23 DIAGNOSIS — I82402 Acute embolism and thrombosis of unspecified deep veins of left lower extremity: Secondary | ICD-10-CM | POA: Diagnosis not present

## 2016-11-23 DIAGNOSIS — B9620 Unspecified Escherichia coli [E. coli] as the cause of diseases classified elsewhere: Secondary | ICD-10-CM | POA: Diagnosis present

## 2016-11-23 DIAGNOSIS — Z885 Allergy status to narcotic agent status: Secondary | ICD-10-CM

## 2016-11-23 DIAGNOSIS — Z8249 Family history of ischemic heart disease and other diseases of the circulatory system: Secondary | ICD-10-CM

## 2016-11-23 HISTORY — DX: Essential (primary) hypertension: I10

## 2016-11-23 LAB — CBC
HCT: 34.7 % — ABNORMAL LOW (ref 36.0–46.0)
Hemoglobin: 11 g/dL — ABNORMAL LOW (ref 12.0–15.0)
MCH: 27.1 pg (ref 26.0–34.0)
MCHC: 31.7 g/dL (ref 30.0–36.0)
MCV: 85.5 fL (ref 78.0–100.0)
Platelets: 259 10*3/uL (ref 150–400)
RBC: 4.06 MIL/uL (ref 3.87–5.11)
RDW: 14.3 % (ref 11.5–15.5)
WBC: 14.9 10*3/uL — ABNORMAL HIGH (ref 4.0–10.5)

## 2016-11-23 LAB — BASIC METABOLIC PANEL
Anion gap: 7 (ref 5–15)
BUN: 19 mg/dL (ref 6–20)
CO2: 24 mmol/L (ref 22–32)
Calcium: 9.1 mg/dL (ref 8.9–10.3)
Chloride: 98 mmol/L — ABNORMAL LOW (ref 101–111)
Creatinine, Ser: 0.96 mg/dL (ref 0.44–1.00)
GFR calc Af Amer: >60 mL/min (ref >60)
GFR calc non Af Amer: 55 mL/min — ABNORMAL LOW (ref >60)
Glucose, Bld: 152 mg/dL — ABNORMAL HIGH (ref 65–99)
Potassium: 3.9 mmol/L (ref 3.5–5.1)
Sodium: 129 mmol/L — ABNORMAL LOW (ref 135–145)

## 2016-11-23 LAB — I-STAT TROPONIN, ED: Troponin i, poc: 0 ng/mL (ref 0.00–0.08)

## 2016-11-23 MED ORDER — IOPAMIDOL (ISOVUE-370) INJECTION 76%
100.0000 mL | Freq: Once | INTRAVENOUS | Status: AC | PRN
  Administered 2016-11-23: 100 mL via INTRAVENOUS

## 2016-11-23 MED ORDER — ONDANSETRON HCL 4 MG/2ML IJ SOLN
4.0000 mg | Freq: Once | INTRAMUSCULAR | Status: AC
  Administered 2016-11-23: 4 mg via INTRAVENOUS
  Filled 2016-11-23: qty 2

## 2016-11-23 MED ORDER — ENOXAPARIN SODIUM 80 MG/0.8ML ~~LOC~~ SOLN
1.0000 mg/kg | Freq: Two times a day (BID) | SUBCUTANEOUS | Status: DC
  Administered 2016-11-23: 70 mg via SUBCUTANEOUS
  Filled 2016-11-23: qty 0.8

## 2016-11-23 NOTE — ED Triage Notes (Signed)
Pt reports waking this AM with left calf pain and swelling. Pt reports recent surgery for colon cancer. Has blood thinner but was vomiting it up. Pt ambulatory. VSS.

## 2016-11-23 NOTE — ED Notes (Addendum)
Patient transported to CT, Nausea relieved w/ medication

## 2016-11-23 NOTE — Progress Notes (Signed)
**  Preliminary report by tech**  Left lower extremity venous duplex complete. There is evidence of acute deep vein thrombosis involving the external iliac, common femoral, femoral, profunda femoral, popliteal, posterior tibial, peroneal veins, and intramuscular deep vein thrombosis of the gastrocnemius veins of the left lower extremity. There is also evidence of acute superficial vein thrombosis involving the greater saphenous vein of the left lower extremity. There is no evidence of a Baker's cyst on the left. Results were given to [facility].  11/23/16 6:40 PM [name] RVT

## 2016-11-23 NOTE — ED Provider Notes (Signed)
[name] Provider Note   CSN: [id number] Arrival date & time: 11/23/16  1653     History   Chief Complaint No chief complaint on file.   HPI [name] is a 76 y.o. female.  Patient with recent admission to the hospital, discharged yesterday, after hemicolectomy due to colon cancer, complicated by postop ileus -- presents with complaint of acute onset of left lower extremity swelling after waking this morning. Patient is also having vomiting without chest pains or shortness of breath. No numbness or tingling, fevers. She feels that she is healing well from her recent surgery. No urinary symptoms. No history of blood clots in the past. Patient is on Plavix for stroke prevention. The onset of this condition was acute. The course is constant. Aggravating factors: palpation. Alleviating factors: none.        Past Medical History:  Diagnosis Date  . Anemia   . Arthritis   . Cancer ([place])    colon  . Coronary artery disease, non-occlusive   . Dyspnea    more so exertion  . Dysrhythmia    skipped beats every now and then  . GERD (gastroesophageal reflux disease)   . Heel spur   . HSV (herpes simplex virus) infection   . Hyperglycemia   . Hyperlipidemia   . Hypertension   . IBS (irritable bowel syndrome)   . IC (interstitial cystitis)   . Insomnia disorder related to known organic factor   . Osteoporosis   . PONV (postoperative nausea and vomiting)    has had more surgeries, and had no problems  . Stroke ([place])    2018    . Vitamin D deficiency     Patient Active Problem List   Diagnosis Date Noted  . Colon cancer ([place]) 11/10/2016  . Cancer of overlapping sites of colon ([place]) 09/23/2016  . Iron deficiency anemia due to chronic blood loss 09/23/2016  . Estrogen deficiency 07/30/2016  . Screening mammogram, encounter for 07/30/2016  . Routine general medical examination at a health care facility 07/27/2016  . Right buttock pain 06/27/2015  .  Accidental fall 06/27/2015  . Left hip pain 06/27/2015  . 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 HTN (hypertension) 05/06/2015  . HLD (hyperlipidemia) 05/06/2015  . GERD (gastroesophageal reflux disease) 05/06/2015  . CAD (coronary artery disease) 05/06/2015  . Insect bites 09/17/2014  . Rectal bleeding 09/25/2013  . Joint pain 05/01/2013  . Tick bite 05/01/2013  . Pain in joint, ankle and foot 03/02/2013  . Seborrheic keratosis 07/05/2012  . Ankle joint pain 07/16/2011  . 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 Callus of foot 10/16/2010  . Coronary artery disease, non-occlusive 10/16/2010  . INSOMNIA 02/02/2010  . HAND PAIN, BILATERAL 01/09/2010  . CRAMP OF LIMB 01/09/2010  . HYPERGLYCEMIA 07/29/2009  . ALLERGIC RHINITIS 12/04/2008  . Vitamin D deficiency 04/30/2008  . COLONIC POLYPS 06/28/2007  . IBS 03/17/2007  . INTERSTITIAL CYSTITIS 03/17/2007  . Osteoporosis 03/17/2007    Past Surgical History:  Procedure Laterality Date  . ABDOMINAL HYSTERECTOMY    . CARDIAC CATHETERIZATION  7/12   non obst dz -- not sure what yr  . CHOLECYSTECTOMY    . EYE SURGERY    . LAPAROSCOPIC RIGHT COLECTOMY Right 11/10/2016   Procedure: LAPAROSCOPIC EXTENDED RIGHT COLECTOMY;  Surgeon: [name], MD;  Location: [facility];  Service: General;  Laterality: Right;  . ROTATOR CUFF REPAIR     2 TIMES    OB History    No data  available       Home Medications    Prior to Admission medications   Medication Sig Start Date End Date Taking? Authorizing Provider  atorvastatin (LIPITOR) 40 MG tablet Take 1 tablet (40 mg total) by mouth daily at 6 PM. 07/30/16  Yes Tower, [name], MD  b complex vitamins tablet Take 1 tablet by mouth daily.   Yes [provider]  Ca Carbonate-Mag Hydroxide (ROLAIDS PO) Take 1-2 tablets by mouth every 8 (eight) hours as needed (for acid reflux).    Yes [provider]  cholecalciferol (VITAMIN D) 1000 units tablet Take 2,000 Units by mouth daily.    Yes  [provider]  COMBIGAN 0.2-0.5 % ophthalmic solution Place 1 drop into both eyes at bedtime.    Yes [provider]  LUMIGAN 0.01 % SOLN Place 1 drop into both eyes every morning.    Yes [provider]  Multiple Vitamin (MULTIVITAMIN WITH MINERALS) TABS tablet Take 1 tablet by mouth daily.   Yes [provider]  aspirin EC 81 MG tablet Take 81 mg by mouth at bedtime.    [provider]  gabapentin (NEURONTIN) 100 MG capsule Take 1 capsule (100 mg total) by mouth 2 (two) times daily as needed. Patient taking differently: Take 100 mg by mouth 2 (two) times daily as needed (for nerve pain).  11/22/16   [name], MD  valACYclovir (VALTREX) 500 MG tablet TAKE 1 TABLET (500 MG TOTAL) BY MOUTH 2 (TWO) TIMES DAILY. 10/07/16   Tower, [name], MD    Family History Family History  Problem Relation Age of Onset  . Alcohol abuse Mother   . Heart attack Mother   . GER disease Mother   . GER disease Father   . Alcohol abuse Father   . Stroke Father   . Tuberculosis Father   . Heart attack Father   . Aneurysm Father   . Mental illness Sister   . Depression Sister   . Breast cancer Neg Hx     Social History Social History  Substance Use Topics  . Smoking status: Never Smoker  . Smokeless tobacco: Never Used  . Alcohol use 0.0 oz/week     Comment: rare     Allergies   Salmon [fish allergy]; Ampicillin; Codeine; Flagyl [metronidazole]; Meperidine hcl; Neomycin; Penicillins; Propoxyphene hcl; Sulfa antibiotics; and Tramadol hcl   Review of Systems Review of Systems  Constitutional: Negative for fever.  HENT: Negative for rhinorrhea and sore throat.   Eyes: Negative for redness.  Respiratory: Negative for cough and shortness of breath.   Cardiovascular: Positive for leg swelling. Negative for chest pain.  Gastrointestinal: Positive for nausea and vomiting. Negative for abdominal pain and diarrhea.  Genitourinary: Negative for dysuria.    Musculoskeletal: Negative for myalgias.  Skin: Negative for rash.  Neurological: Negative for headaches.     Physical Exam Updated Vital Signs BP 128/62 (BP Location: Right Arm)   Pulse (!) 108   Temp 99 F (37.2 C) (Oral)   Resp 20   Ht 5\' 3"  (1.6 m)   Wt 67.6 kg (149 lb)   SpO2 100%   BMI 26.39 kg/m   Physical Exam  Constitutional: She appears well-developed and well-nourished.  HENT:  Head: Normocephalic and atraumatic.  Eyes: Conjunctivae are normal. Right eye exhibits no discharge. Left eye exhibits no discharge.  Neck: Normal range of motion. Neck supple.  Cardiovascular: Regular rhythm and normal heart sounds.  Tachycardia present.   Mild  tachycardia, regular rhythm.  Pulmonary/Chest: Effort normal and breath sounds normal.  Abdominal: Soft. There is no tenderness.  Neurological: She is alert.  Skin: Skin is warm and dry.  Generalized swelling noted to left lower extremity.   Psychiatric: She has a normal mood and affect.  Nursing note and vitals reviewed.    ED Treatments / Results  Labs (all labs ordered are listed, but only abnormal results are displayed) Labs Reviewed  BASIC METABOLIC PANEL - Abnormal; Notable for the following:       Result Value   Sodium 129 (*)    Chloride 98 (*)    Glucose, Bld 152 (*)    GFR calc non Af Amer 55 (*)    All other components within normal limits  CBC - Abnormal; Notable for the following:    WBC 14.9 (*)    Hemoglobin 11.0 (*)    HCT 34.7 (*)    All other components within normal limits  CBC  I-STAT TROPONIN, ED    Procedures Procedures (including critical care time)  Medications Ordered in ED Medications  ondansetron (ZOFRAN) injection 4 mg (4 mg Intravenous Given 11/23/16 2041)  iopamidol (ISOVUE-370) 76 % injection 100 mL (100 mLs Intravenous Contrast Given 11/23/16 2149)     Initial Impression / Assessment and Plan / ED Course  I have reviewed the triage vital signs and the nursing  notes.  Pertinent labs & imaging results that were available during my care of the patient were reviewed by me and considered in my medical decision making (see chart for details).     Patient seen and examined. She had DVT ultrasound performed prior to my examination. This was positive for clot.    Doppler report: There is evidence of acute deep vein thrombosis involving the external iliac, common femoral, femoral, profunda femoral, popliteal, posterior tibial, peroneal veins, and intramuscular deep vein thrombosis of the gastrocnemius veins of the left lower extremity. There is also evidence of acute superficial vein thrombosis involving the greater saphenous vein of the left lower extremity.  Vital signs reviewed and are as follows: BP 128/62 (BP Location: Right Arm)   Pulse (!) 108   Temp 99 F (37.2 C) (Oral)   Resp 20   Ht 5\' 3"  (1.6 m)   Wt 67.6 kg (149 lb)   SpO2 100%   BMI 26.39 kg/m   8:58 PM EKG and troponin ordered. Will evaluate for PE given tachycardia and vomiting. Patient discussed with Dr. Johnney Killian.  12:33 AM CT negative for PE. Patient discussed with and seen by Dr. Johnney Killian. Advises admit to hospital given patient with large clot who has just been treated with Lovenox inpatient until yesterday and has developed a DVT despite this. Also, unclear if patient would be IR/vascular candidate and may require consultation.  Dr. Johnney Killian and myself discussed patient with Dr. Alcario Drought.  Final Clinical Impressions(s) / ED Diagnoses   Final diagnoses:  Acute deep vein thrombosis (DVT) of proximal vein of left lower extremity (HCC)  Non-intractable vomiting with nausea, unspecified vomiting type   Admit.    New Prescriptions New Prescriptions   No medications on file     Carlisle Cater, Hershal Coria 11/24/16 0037    Charlesetta Shanks, MD 11/26/16 [phone]

## 2016-11-23 NOTE — Progress Notes (Signed)
ANTICOAGULATION CONSULT NOTE - Initial Consult  Pharmacy Consult for Lovenox dosing  Indication: DVT, PE ruleout   Allergies  Allergen Reactions  . Ampicillin Other (See Comments)    Reaction:  Unknown   . Codeine Nausea And Vomiting  . Flagyl [Metronidazole] Nausea And Vomiting    Nausea/vomiting   . Meperidine Hcl Nausea And Vomiting  . Penicillins Other (See Comments)    Reaction:  Unknown   . Propoxyphene Hcl Nausea And Vomiting  . Sulfa Antibiotics Hives  . Tramadol Hcl Nausea And Vomiting    Patient Measurements: Height: 5\' 3"  (160 cm) Weight: 149 lb (67.6 kg) IBW/kg (Calculated) : 52.4 Heparin Dosing Weight: 67.6kg  Vital Signs: Temp: 99 F (37.2 C) (10/02 1752) Temp Source: Oral (10/02 1752) BP: 128/62 (10/02 1752) Pulse Rate: 108 (10/02 1752)  Labs:  Recent Labs  11/22/16 0641 11/23/16 1805  HGB 9.5* 11.0*  HCT 30.2* 34.7*  PLT 231 259  CREATININE 0.75 0.96    Estimated Creatinine Clearance: 44.6 mL/min (by C-G formula based on SCr of 0.96 mg/dL).   Medical History: Past Medical History:  Diagnosis Date  . Anemia   . Arthritis   . Cancer ([place])    colon  . Coronary artery disease, non-occlusive   . Dyspnea    more so exertion  . Dysrhythmia    skipped beats every now and then  . GERD (gastroesophageal reflux disease)   . Heel spur   . HSV (herpes simplex virus) infection   . Hyperglycemia   . Hyperlipidemia   . IBS (irritable bowel syndrome)   . IC (interstitial cystitis)   . Insomnia disorder related to known organic factor   . Osteoporosis   . PONV (postoperative nausea and vomiting)    has had more surgeries, and had no problems  . Stroke ([place])    2018    . Vitamin D deficiency      Assessment: 83 YOF presents with left calf pain and swelling. Pharmacy consulted to dose lovenox for DVT and R/O PE.  Patient taking clopidogrel PTA, but no anticoag.  HGB 11.0, HCT 34.7, Scr 0.96. Goal of Therapy:  Anti-Xa level 0.6-1 units/ml  4hrs after LMWH dose given Monitor platelets by anticoagulation protocol: Yes   Plan:  Lovenox 70mg  SQ every 12 hours. Monitor CBC  every 72 hours.  Monitor for s/sx of bleeding.  Jerrye Noble, PharmD Candidate 11/23/2016,7:49 PM

## 2016-11-23 NOTE — ED Provider Notes (Signed)
Medical screening examination/treatment/procedure(s) were conducted as a shared visit with non-physician practitioner(s) and myself.  I personally evaluated the patient during the encounter.   EKG Interpretation None     Patient for she was just discharged from the hospital yesterday. She had partial colectomy for colon cancer. Patient was on Lovenox during hospitalization. She reports this morning she noted swelling and tightness in her left lower extremity. She also developed nausea and vomiting. On examination patient is alert and appropriate. No respiratory distress. Entire left lower extremity has diffuse swelling relative to the right. Distal pedal pulses 2+ and symmetric. Patient has developed extensive DVT including up to the external iliacs on the left. This is in spite of having been on Lovenox during her hospitalization. At this point, plan will be for admission for anticoagulation and vascular consultation. I agree with plan of management.   Charlesetta Shanks, MD 11/23/16 2324

## 2016-11-24 ENCOUNTER — Encounter (HOSPITAL_COMMUNITY): Payer: Self-pay | Admitting: *Deleted

## 2016-11-24 DIAGNOSIS — M810 Age-related osteoporosis without current pathological fracture: Secondary | ICD-10-CM | POA: Diagnosis present

## 2016-11-24 DIAGNOSIS — K219 Gastro-esophageal reflux disease without esophagitis: Secondary | ICD-10-CM | POA: Diagnosis present

## 2016-11-24 DIAGNOSIS — I10 Essential (primary) hypertension: Secondary | ICD-10-CM | POA: Diagnosis present

## 2016-11-24 DIAGNOSIS — I824Y2 Acute embolism and thrombosis of unspecified deep veins of left proximal lower extremity: Secondary | ICD-10-CM

## 2016-11-24 DIAGNOSIS — Z8249 Family history of ischemic heart disease and other diseases of the circulatory system: Secondary | ICD-10-CM | POA: Diagnosis not present

## 2016-11-24 DIAGNOSIS — K589 Irritable bowel syndrome without diarrhea: Secondary | ICD-10-CM | POA: Diagnosis present

## 2016-11-24 DIAGNOSIS — Z86718 Personal history of other venous thrombosis and embolism: Secondary | ICD-10-CM | POA: Diagnosis not present

## 2016-11-24 DIAGNOSIS — Z885 Allergy status to narcotic agent status: Secondary | ICD-10-CM | POA: Diagnosis not present

## 2016-11-24 DIAGNOSIS — B009 Herpesviral infection, unspecified: Secondary | ICD-10-CM | POA: Diagnosis not present

## 2016-11-24 DIAGNOSIS — R140 Abdominal distension (gaseous): Secondary | ICD-10-CM | POA: Diagnosis not present

## 2016-11-24 DIAGNOSIS — I82412 Acute embolism and thrombosis of left femoral vein: Secondary | ICD-10-CM | POA: Diagnosis not present

## 2016-11-24 DIAGNOSIS — C188 Malignant neoplasm of overlapping sites of colon: Secondary | ICD-10-CM | POA: Diagnosis not present

## 2016-11-24 DIAGNOSIS — Z880 Allergy status to penicillin: Secondary | ICD-10-CM | POA: Diagnosis not present

## 2016-11-24 DIAGNOSIS — B9620 Unspecified Escherichia coli [E. coli] as the cause of diseases classified elsewhere: Secondary | ICD-10-CM | POA: Diagnosis not present

## 2016-11-24 DIAGNOSIS — C189 Malignant neoplasm of colon, unspecified: Secondary | ICD-10-CM | POA: Diagnosis not present

## 2016-11-24 DIAGNOSIS — Z8673 Personal history of transient ischemic attack (TIA), and cerebral infarction without residual deficits: Secondary | ICD-10-CM | POA: Diagnosis not present

## 2016-11-24 DIAGNOSIS — K567 Ileus, unspecified: Secondary | ICD-10-CM | POA: Diagnosis not present

## 2016-11-24 DIAGNOSIS — I82502 Chronic embolism and thrombosis of unspecified deep veins of left lower extremity: Secondary | ICD-10-CM | POA: Diagnosis not present

## 2016-11-24 DIAGNOSIS — Z881 Allergy status to other antibiotic agents status: Secondary | ICD-10-CM | POA: Diagnosis not present

## 2016-11-24 DIAGNOSIS — E785 Hyperlipidemia, unspecified: Secondary | ICD-10-CM | POA: Diagnosis present

## 2016-11-24 DIAGNOSIS — Z882 Allergy status to sulfonamides status: Secondary | ICD-10-CM | POA: Diagnosis not present

## 2016-11-24 DIAGNOSIS — N390 Urinary tract infection, site not specified: Secondary | ICD-10-CM | POA: Diagnosis not present

## 2016-11-24 DIAGNOSIS — Z888 Allergy status to other drugs, medicaments and biological substances status: Secondary | ICD-10-CM | POA: Diagnosis not present

## 2016-11-24 DIAGNOSIS — E860 Dehydration: Secondary | ICD-10-CM | POA: Diagnosis not present

## 2016-11-24 DIAGNOSIS — D509 Iron deficiency anemia, unspecified: Secondary | ICD-10-CM | POA: Diagnosis not present

## 2016-11-24 DIAGNOSIS — I82492 Acute embolism and thrombosis of other specified deep vein of left lower extremity: Secondary | ICD-10-CM | POA: Diagnosis not present

## 2016-11-24 DIAGNOSIS — I2510 Atherosclerotic heart disease of native coronary artery without angina pectoris: Secondary | ICD-10-CM | POA: Diagnosis present

## 2016-11-24 DIAGNOSIS — I82432 Acute embolism and thrombosis of left popliteal vein: Secondary | ICD-10-CM | POA: Diagnosis not present

## 2016-11-24 DIAGNOSIS — Z91013 Allergy to seafood: Secondary | ICD-10-CM | POA: Diagnosis not present

## 2016-11-24 LAB — HEPARIN LEVEL (UNFRACTIONATED)
Heparin Unfractionated: 0.53 IU/mL (ref 0.30–0.70)
Heparin Unfractionated: 0.48 IU/mL (ref 0.30–0.70)

## 2016-11-24 MED ORDER — HEPARIN (PORCINE) IN NACL 100-0.45 UNIT/ML-% IJ SOLN
1100.0000 [IU]/h | INTRAMUSCULAR | Status: DC
  Administered 2016-11-24: 1000 [IU]/h via INTRAVENOUS
  Administered 2016-11-26 – 2016-11-27 (×2): 1100 [IU]/h via INTRAVENOUS
  Filled 2016-11-24 (×4): qty 250

## 2016-11-24 MED ORDER — GABAPENTIN 100 MG PO CAPS
100.0000 mg | ORAL_CAPSULE | Freq: Two times a day (BID) | ORAL | Status: DC | PRN
  Administered 2016-11-26 – 2016-11-28 (×2): 100 mg via ORAL
  Filled 2016-11-24 (×3): qty 1

## 2016-11-24 MED ORDER — B COMPLEX-C PO TABS
1.0000 | ORAL_TABLET | Freq: Every day | ORAL | Status: DC
  Administered 2016-11-24 – 2016-11-30 (×4): 1 via ORAL
  Filled 2016-11-24 (×7): qty 1

## 2016-11-24 MED ORDER — VALACYCLOVIR HCL 500 MG PO TABS
500.0000 mg | ORAL_TABLET | Freq: Two times a day (BID) | ORAL | Status: DC
  Administered 2016-11-24: 500 mg via ORAL
  Filled 2016-11-24 (×2): qty 1

## 2016-11-24 MED ORDER — ADULT MULTIVITAMIN W/MINERALS CH
1.0000 | ORAL_TABLET | Freq: Every day | ORAL | Status: DC
  Administered 2016-11-24 – 2016-11-30 (×4): 1 via ORAL
  Filled 2016-11-24 (×7): qty 1

## 2016-11-24 MED ORDER — POLYETHYLENE GLYCOL 3350 17 G PO PACK
17.0000 g | PACK | Freq: Every day | ORAL | Status: DC
  Administered 2016-11-24 – 2016-11-29 (×6): 17 g via ORAL
  Filled 2016-11-24 (×7): qty 1

## 2016-11-24 MED ORDER — BRIMONIDINE TARTRATE-TIMOLOL 0.2-0.5 % OP SOLN: 1.0000 [drp] | Freq: Every day | OPHTHALMIC | Status: DC

## 2016-11-24 MED ORDER — TIMOLOL MALEATE 0.5 % OP SOLN
1.0000 [drp] | Freq: Every day | OPHTHALMIC | Status: DC
  Administered 2016-11-24 – 2016-11-29 (×6): 1 [drp] via OPHTHALMIC
  Filled 2016-11-24: qty 5

## 2016-11-24 MED ORDER — BRIMONIDINE TARTRATE 0.2 % OP SOLN
1.0000 [drp] | Freq: Every day | OPHTHALMIC | Status: DC
  Administered 2016-11-24 – 2016-11-29 (×6): 1 [drp] via OPHTHALMIC
  Filled 2016-11-24: qty 5

## 2016-11-24 MED ORDER — ACETAMINOPHEN 325 MG PO TABS
650.0000 mg | ORAL_TABLET | Freq: Four times a day (QID) | ORAL | Status: DC | PRN
  Administered 2016-11-26: 650 mg via ORAL
  Filled 2016-11-24 (×2): qty 2

## 2016-11-24 MED ORDER — VITAMIN D 1000 UNITS PO TABS
2000.0000 [IU] | ORAL_TABLET | Freq: Every day | ORAL | Status: DC
  Administered 2016-11-24 – 2016-11-30 (×5): 2000 [IU] via ORAL
  Filled 2016-11-24 (×7): qty 2

## 2016-11-24 MED ORDER — ONDANSETRON HCL 4 MG/2ML IJ SOLN
4.0000 mg | Freq: Four times a day (QID) | INTRAMUSCULAR | Status: DC | PRN
  Administered 2016-11-24 – 2016-11-27 (×7): 4 mg via INTRAVENOUS
  Filled 2016-11-24 (×7): qty 2

## 2016-11-24 MED ORDER — ATORVASTATIN CALCIUM 40 MG PO TABS
40.0000 mg | ORAL_TABLET | Freq: Every day | ORAL | Status: DC
  Administered 2016-11-24 – 2016-11-29 (×5): 40 mg via ORAL
  Filled 2016-11-24 (×6): qty 1

## 2016-11-24 MED ORDER — LATANOPROST 0.005 % OP SOLN
1.0000 [drp] | Freq: Every day | OPHTHALMIC | Status: DC
  Administered 2016-11-24 – 2016-11-30 (×7): 1 [drp] via OPHTHALMIC
  Filled 2016-11-24: qty 2.5

## 2016-11-24 MED ORDER — CALCIUM CARBONATE ANTACID 500 MG PO CHEW
500.0000 mg | CHEWABLE_TABLET | Freq: Three times a day (TID) | ORAL | Status: DC | PRN
  Filled 2016-11-24: qty 1

## 2016-11-24 MED ORDER — ONDANSETRON HCL 4 MG PO TABS
4.0000 mg | ORAL_TABLET | Freq: Four times a day (QID) | ORAL | Status: DC | PRN
  Administered 2016-11-30: 4 mg via ORAL
  Filled 2016-11-24: qty 1

## 2016-11-24 MED ORDER — ACETAMINOPHEN 650 MG RE SUPP: 650.0000 mg | Freq: Four times a day (QID) | RECTAL | Status: DC | PRN

## 2016-11-24 NOTE — Care Management Note (Signed)
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 Lives at home with husband. States she has RW, bedside commode, shower chair, and cane at home. Will continue to follow for dc needs.   PCP Loura Pardon A MD   Expected Discharge Date:                Expected Discharge Plan:  Home/Self Care  In-House Referral:  NA  Discharge planning Services  CM Consult  Post Acute Care Choice:  NA Choice offered to:  NA  DME Arranged:  N/A DME Agency:  NA  HH Arranged:  NA HH Agency:  NA  Status of Service:  Completed, signed off  If discussed at [place] of Stay Meetings, dates discussed:    Additional Comments:  Erenest Rasher, RN 11/24/2016, 3:16 PM

## 2016-11-24 NOTE — Progress Notes (Signed)
Please check copay , prior authorization for Eliquis 10mg  twice a day, then 5mg  twice a day. thanks  RE: benefits check  Received: Today  Message Contents  Roselind Messier, RN        # 6.  S/W  ZAKIAH @ [employer] [facility] # [id number] OPT-2    ELIQUIS 10 MG - NO   1. ELIQUIS 2.5 MG BID  COVER- YES  CO-PAY- $ 42.00  PRIOR APPROVAL- NO   2. ELIQUIS 5 MG BID  COVER- YES  CO-PAY- $ 42.00  PRIOR APPROVAL- NO   PREFERRED PHARMACY : [facility]   Previous Messages     Whitman Hero RN,BSN,CM

## 2016-11-24 NOTE — Progress Notes (Signed)
ANTICOAGULATION CONSULT NOTE - Follow Up Consult  Pharmacy Consult for Heparin Indication: DVT  Allergies  Allergen Reactions  . Salmon [Fish Allergy] Anaphylaxis    This happens with "CANNED SALMON" only, if eaten uncooked  . Ampicillin Nausea Only  . Codeine Nausea And Vomiting  . Flagyl [Metronidazole] Nausea And Vomiting    Patient was taking this along with Neomycin and could not differentiate which med triggered the nausea & vomiting  . Meperidine Hcl Nausea And Vomiting  . Neomycin Nausea And Vomiting    Patient was taking this along with Flagyl and could not differentiate which med triggered the nausea & vomiting  . Penicillins Other (See Comments)    Has patient had a PCN reaction causing immediate rash, facial/tongue/throat swelling, SOB or lightheadedness with hypotension: Unknown Has patient had a PCN reaction causing severe rash involving mucus membranes or skin necrosis: No Has patient had a PCN reaction that required hospitalization: No Has patient had a PCN reaction occurring within the last 10 years: No If all of the above answers are "NO", then may proceed with Cephalosporin use.   Marland Kitchen Propoxyphene Hcl Nausea And Vomiting  . Sulfa Antibiotics Hives  . Tramadol Hcl Nausea And Vomiting    Patient Measurements: Height: 5\' 3"  (160 cm) Weight: 149 lb 0.5 oz (67.6 kg) IBW/kg (Calculated) : 52.4 Heparin Dosing Weight: 67.6 kg  Vital Signs: Temp: 98.7 F (37.1 C) (10/03 1408) Temp Source: Oral (10/03 1408) BP: 103/61 (10/03 1408) Pulse Rate: 83 (10/03 1408)  Labs:  Recent Labs  11/22/16 0641 11/23/16 1805 11/24/16 1405 11/24/16 1924  HGB 9.5* 11.0*  --   --   HCT 30.2* 34.7*  --   --   PLT 231 259  --   --   HEPARINUNFRC  --   --  0.53 0.48  CREATININE 0.75 0.96  --   --     Estimated Creatinine Clearance: 44.6 mL/min (by C-G formula based on SCr of 0.96 mg/dL).   Medications:  Scheduled:  . atorvastatin  40 mg Oral q1800  . B-complex with vitamin  C  1 tablet Oral Daily  . brimonidine  1 drop Both Eyes QHS   And  . timolol  1 drop Both Eyes QHS  . cholecalciferol  2,000 Units Oral Daily  . latanoprost  1 drop Both Eyes Daily  . multivitamin with minerals  1 tablet Oral Daily  . polyethylene glycol  17 g Oral Daily  . valACYclovir  500 mg Oral BID   Infusions:  . heparin 1,000 Units/hr (11/24/16 0514)    Assessment: 76 yo F with colon cancer s/p hemicolectomy on 9/18, developed post-op ileus, discharged 10/1.  Presented to ED with acute left leg swelling and dx with large DVT.    Repeat heparin level remains therapeutic at 1000 units/hr. RN reports no s/s of bleeding.   Goal of Therapy:  Heparin level 0.3-0.7 units/ml Monitor platelets by anticoagulation protocol: Yes   Plan:  Continue heparin at 1000 units/hr Heparin level and CBC daily while on heparin Monitor for s/s of bleeding   Albertina Parr, PharmD., BCPS Clinical Pharmacist Pager [phone]

## 2016-11-24 NOTE — Progress Notes (Signed)
PROGRESS NOTE [facility]   [place]   QQP:[id number] DOB: 06/26/1940  DOA: 11/23/2016 PCP: Abner Greenspan, MD   Brief Narrative:  [name] is a 76 year old female with a medical history of hemicolectomy for colon cancer on 9/18 with postop ileus discharged on 10/1, HSV and hyperlipidemia. She presented to the ED on 10/2 with acute left leg swelling and "tightness" and was diagnosed with a large DVT involving external iliac, common femoral, femoral, profunda femoral, popliteal, posterior tibial, peroneal, and intramuscular gastrocnemius veins. She was started on heparin and VVS consulted.   Subjective: Patient reporting pain in the left leg and feeling "tight." She has not had a bowel movement in the past 3 days and took a stool softener at home before admission. States she has "herpes" on her back, which is where it normally arises. She takes valtrex and it normally goes away within 2-3 days. Denies chest pain, shortness of breath, nausea, vomiting, or diarrhea.   Assessment & Plan:  Principal Problem:   Acute deep vein thrombosis (DVT) of proximal vein of left lower extremity (HCC) Active Problems:   Cancer of overlapping sites of colon ([place])  Acute DVT of left lower extremity -Recent operation and post op complication leading to prolonged hospitalization. No family history of personal history of clotting disorders.  -DVT involving involving external iliac, common femoral, femoral, profunda femoral, popliteal, posterior tibial, peroneal, and intramuscular gastrocnemius veins -Started on heparin. VVS consulted for further management.   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 Discharged 10/1.  -Was supposed to have a follow up appointment this week with GI but will need to reschedule -Had nausea  yesterday- resolved today.   Hyperlipidemia -Continue statin therapy  DVT prophylaxis: Heparin Code Status: Full Family Communication: Daughter at bedside Disposition Plan: Once medically stable. Pending med vs surgical management of DVT  Consultants:   VVS  Procedures:   None  Antimicrobials:  None   Objective: Vitals:   11/24/16 0200 11/24/16 0230 11/24/16 0351 11/24/16 0552  BP: 122/64 (!) 143/68 (!) 145/65 132/65  Pulse: 80 82 75 94  Resp: 13 11 16 16   Temp:   98.5 F (36.9 C) 98.4 F (36.9 C)  TempSrc:   Oral Oral  SpO2: 99% 100% 100% 98%  Weight:   67.6 kg (149 lb 0.5 oz)   Height:        Intake/Output Summary (Last 24 hours) at 11/24/16 1253 Last data filed at 11/24/16 0929  Gross per 24 hour  Intake              120 ml  Output                0 ml  Net              120 ml   Filed Weights   11/23/16 1752 11/24/16 0351  Weight: 67.6 kg (149 lb) 67.6 kg (149 lb 0.5 oz)   Examination: General exam: Appears calm and comfortable with left leg elevated on pillows.  Respiratory system: Clear to auscultation. No wheezes,crackle or rhonchi. No increased work of breathing.  Cardiovascular system: S1 & S2 heard, RRR. No JVD, murmurs, rubs or gallops Gastrointestinal system: Abdomen is nondistended and soft. Tender due to recent operation. Normal bowel sounds heard. Central nervous system: Alert and oriented. No focal neurological deficits. Extremities:  Left leg swelling and increased warmth from foot to thigh. 2+ peripheral pulses.  Skin: Dime-sized area of small blisters on left buttock that she reports is "herpes"  Data Reviewed: I have personally reviewed following labs and imaging studies  CBC:  Recent Labs Lab 11/22/16 0641 11/23/16 1805  WBC 8.3 14.9*  NEUTROABS 5.3  --   HGB 9.5* 11.0*  HCT 30.2* 34.7*  MCV 86.0 85.5  PLT 231 643   Basic Metabolic Panel:  Recent Labs Lab 11/18/16 0405 11/19/16 0418 11/22/16 0641 11/23/16 1805  NA 135  135 136 129*  K 3.9 4.2 4.2 3.9  CL 103 104 105 98*  CO2 26 27 27 24   GLUCOSE 113* 114* 122* 152*  BUN 14 17 18 19   CREATININE 0.76 0.71 0.75 0.96  CALCIUM 8.4* 8.5* 8.6* 9.1  MG 2.1 2.2 2.0  --   PHOS 3.7 3.3 3.3  --    GFR: Estimated Creatinine Clearance: 44.6 mL/min (by C-G formula based on SCr of 0.96 mg/dL). Liver Function Tests:  Recent Labs Lab 11/18/16 0405 11/22/16 0641  AST 26 16  ALT 22 22  ALKPHOS 69 74  BILITOT 0.4 0.5  PROT 5.8* 5.5*  ALBUMIN 2.6* 2.6*   CBG:  Recent Labs Lab 11/18/16 0015 11/18/16 0439 11/18/16 0747 11/18/16 1213 11/18/16 1625  GLUCAP 130* 115* 87 110* 112*   Lipid Profile:  Recent Labs  11/22/16 0641  TRIG 122   Radiology Studies: Ct Angio Chest Pe W And/or Wo Contrast  Result Date: 11/23/2016 CLINICAL DATA:  Nausea and vomiting with swelling in the feet. Recent surgery from September 19th. Right colectomy. EXAM: CT ANGIOGRAPHY CHEST WITH CONTRAST TECHNIQUE: Multidetector CT imaging of the chest was performed using the standard protocol during bolus administration of intravenous contrast. Multiplanar CT image reconstructions and MIPs were obtained to evaluate the vascular anatomy. CONTRAST:  100 mL Isovue 370 COMPARISON:  10/04/2016 PET-CT FINDINGS: Cardiovascular: Satisfactory opacification of the pulmonary arteries to the segmental level. No evidence of pulmonary embolism. Normal heart size. No pericardial effusion. Normal caliber thoracic aorta. No evidence of aortic dissection. Scattered calcifications in the aorta and coronary arteries. Great vessel origins are patent. Mediastinum/Nodes: No enlarged mediastinal, hilar, or axillary lymph nodes. Thyroid gland, trachea, and esophagus demonstrate no significant findings. Lungs/Pleura: Mild dependent changes in the lung bases. No airspace disease or consolidation in the lungs. Scattered peripheral nodules in the right lung, all measuring less than 4 mm. No significant changes. No pleural  effusions. No pneumothorax. Upper Abdomen: Surgical absence of the gallbladder. No bile duct dilatation. Musculoskeletal: Degenerative changes in the spine. Benign-appearing sclerosis in a midthoracic vertebra. Schmorl's nodes. No destructive bone lesions. Review of the MIP images confirms the above findings. IMPRESSION: No evidence of significant pulmonary embolus. No evidence of active pulmonary disease. Aortic Atherosclerosis (ICD10-I70.0). Electronically Signed   By: Lucienne Capers M.D.   On: 11/23/2016 22:14   Scheduled Meds: . atorvastatin  40 mg Oral q1800  . B-complex with vitamin C  1 tablet Oral Daily  . brimonidine  1 drop Both Eyes QHS   And  . timolol  1 drop Both Eyes QHS  . cholecalciferol  2,000 Units Oral Daily  . latanoprost  1 drop Both Eyes Daily  . multivitamin with minerals  1 tablet Oral Daily   Continuous Infusions: . heparin 1,000 Units/hr (11/24/16 0514)    LOS: 0 days   Time spent: Total of 25 minutes spent with pt, greater than 50% of which  was spent in discussion of  treatment, counseling and coordination of care  Minda Ditto, PA-S   Pager: Text Page via [email or web address]  If 7PM-7AM, please contact night-coverage [email or web address] 11/24/2016, 12:53 PM

## 2016-11-24 NOTE — ED Notes (Signed)
Attempted to call report

## 2016-11-24 NOTE — Progress Notes (Signed)
[name] for Heparin Indication: DVT  Allergies  Allergen Reactions  . Salmon [Fish Allergy] Anaphylaxis    This happens with "CANNED SALMON" only, if eaten uncooked  . Ampicillin Nausea Only  . Codeine Nausea And Vomiting  . Flagyl [Metronidazole] Nausea And Vomiting    Patient was taking this along with Neomycin and could not differentiate which med triggered the nausea & vomiting  . Meperidine Hcl Nausea And Vomiting  . Neomycin Nausea And Vomiting    Patient was taking this along with Flagyl and could not differentiate which med triggered the nausea & vomiting  . Penicillins Other (See Comments)    Has patient had a PCN reaction causing immediate rash, facial/tongue/throat swelling, SOB or lightheadedness with hypotension: Unknown Has patient had a PCN reaction causing severe rash involving mucus membranes or skin necrosis: No Has patient had a PCN reaction that required hospitalization: No Has patient had a PCN reaction occurring within the last 10 years: No If all of the above answers are "NO", then may proceed with Cephalosporin use.   Marland Kitchen Propoxyphene Hcl Nausea And Vomiting  . Sulfa Antibiotics Hives  . Tramadol Hcl Nausea And Vomiting    Patient Measurements: Height: 5\' 3"  (160 cm) Weight: 149 lb (67.6 kg) IBW/kg (Calculated) : 52.4 Heparin Dosing Weight: 65 kg  Vital Signs: Temp: 98.5 F (36.9 C) (10/03 0351) Temp Source: Oral (10/03 0351) BP: 145/65 (10/03 0351) Pulse Rate: 75 (10/03 0351)  Labs:  Recent Labs  11/22/16 0641 11/23/16 1805  HGB 9.5* 11.0*  HCT 30.2* 34.7*  PLT 231 259  CREATININE 0.75 0.96    Estimated Creatinine Clearance: 44.6 mL/min (by C-G formula based on SCr of 0.96 mg/dL).  [name]  Lovenox 70 mg given at 8 pm.    Goal of Therapy:  Heparin level 0.3-0.7 units/ml Monitor platelets by anticoagulation protocol: Yes   Plan:  At 6 am  start heparin 1000 units/hr Check heparin level in 8 hours.   Sharran Caratachea, [place] Curb 11/24/2016,4:28 AM

## 2016-11-24 NOTE — Care Management Important Message (Deleted)
Important Message  Patient Details  Name: [name] MRN: [id number] Date of Birth: 28-Dec-1940   Medicare Important Message Given:  Yes    Erenest Rasher, RN 11/24/2016, 3:06 PM

## 2016-11-24 NOTE — Consult Note (Signed)
I will see the patient tomorrow.  I am ordering a ilio-caval U/S to evaluate for thrombus in the IVC and common iliac vein.  She could potentially be a candidate for mechanical thrombectomy next week.  Continue heparin   [name]

## 2016-11-24 NOTE — Progress Notes (Signed)
[name] DTH:[id number] DOB: Sep 13, 1940 DOA: 11/23/2016  PCP: Abner Greenspan, MD  Patient coming from: Home  I have personally briefly reviewed patient's old medical records in [place]  Chief Complaint: L leg swelling  HPI: [name] is a 76 y.o. female with medical history significant of hemicolectomy for colon cancer on 9/18.  On DVT ppx lovenox post op.  Post op course complicated by ileus.  Discharged yesterday.  This morning woke up with acute onset of L lower extremity swelling and pain.  She presents to the ED for concern of DVT.  Had some episodes of vomiting later in day, no CP, no SOB.   ED Course: Has extensive DVT in LLE including external iliac, common femoral, femoral, profunda femoral, popliteal, posterior tibial, peroneal veins, and intramuscular deep vein thrombosis of the gastrocnemius veins of the left lower extremity.   Review of Systems: As per HPI otherwise 10 point review of systems negative.       Past Medical History:  Diagnosis Date  . Anemia   . Arthritis   . Cancer ([place])    colon  . Coronary artery disease, non-occlusive   . Dyspnea    more so exertion  . Dysrhythmia    skipped beats every now and then  . GERD (gastroesophageal reflux disease)   . Heel spur   . HSV (herpes simplex virus) infection   . Hyperglycemia   . Hyperlipidemia   . Hypertension   . IBS (irritable bowel syndrome)   . IC (interstitial cystitis)   . Insomnia disorder related to known organic factor   . Osteoporosis   . PONV (postoperative nausea and vomiting)    has had more surgeries, and had no problems  . Stroke ([place])    2018    . Vitamin D deficiency    Patient alert and oriented x 4, pain rated 2/10 in left lower extremity.  IV in RAC, saline locked.  Oriented to the unit, call bell within reach.  Patient has closed healing incisions on her abdomen.  A blister on her left buttock that she states is  "herpes".  Will continue to monitor.

## 2016-11-24 NOTE — Care Management Obs Status (Signed)
[facility] NOTIFICATION   Patient Details  Name: [name] MRN: [id number] Date of Birth: [birth year]   Medicare Observation Status Notification Given:  Yes    Erenest Rasher, RN 11/24/2016, 5:50 PM

## 2016-11-24 NOTE — Progress Notes (Signed)
ANTICOAGULATION CONSULT NOTE - Follow Up Consult  Pharmacy Consult for Heparin Indication: DVT  Allergies  Allergen Reactions  . Salmon [Fish Allergy] Anaphylaxis    This happens with "CANNED SALMON" only, if eaten uncooked  . Ampicillin Nausea Only  . Codeine Nausea And Vomiting  . Flagyl [Metronidazole] Nausea And Vomiting    Patient was taking this along with Neomycin and could not differentiate which med triggered the nausea & vomiting  . Meperidine Hcl Nausea And Vomiting  . Neomycin Nausea And Vomiting    Patient was taking this along with Flagyl and could not differentiate which med triggered the nausea & vomiting  . Penicillins Other (See Comments)    Has patient had a PCN reaction causing immediate rash, facial/tongue/throat swelling, SOB or lightheadedness with hypotension: Unknown Has patient had a PCN reaction causing severe rash involving mucus membranes or skin necrosis: No Has patient had a PCN reaction that required hospitalization: No Has patient had a PCN reaction occurring within the last 10 years: No If all of the above answers are "NO", then may proceed with Cephalosporin use.   Marland Kitchen Propoxyphene Hcl Nausea And Vomiting  . Sulfa Antibiotics Hives  . Tramadol Hcl Nausea And Vomiting    Patient Measurements: Height: 5\' 3"  (160 cm) Weight: 149 lb 0.5 oz (67.6 kg) IBW/kg (Calculated) : 52.4 Heparin Dosing Weight: 67.6 kg  Vital Signs: Temp: 98.7 F (37.1 C) (10/03 1408) Temp Source: Oral (10/03 1408) BP: 103/61 (10/03 1408) Pulse Rate: 83 (10/03 1408)  Labs:  Recent Labs  11/22/16 0641 11/23/16 1805 11/24/16 1405  HGB 9.5* 11.0*  --   HCT 30.2* 34.7*  --   PLT 231 259  --   HEPARINUNFRC  --   --  0.53  CREATININE 0.75 0.96  --     Estimated Creatinine Clearance: 44.6 mL/min (by C-G formula based on SCr of 0.96 mg/dL).   Medications:  Scheduled:  . atorvastatin  40 mg Oral q1800  . B-complex with vitamin C  1 tablet Oral Daily  . brimonidine   1 drop Both Eyes QHS   And  . timolol  1 drop Both Eyes QHS  . cholecalciferol  2,000 Units Oral Daily  . latanoprost  1 drop Both Eyes Daily  . multivitamin with minerals  1 tablet Oral Daily   Infusions:  . heparin 1,000 Units/hr (11/24/16 5093)    Assessment: 76 yo F with colon cancer s/p hemicolectomy on 9/18, developed post-op ileus, discharged 10/1.  Presented to ED with acute left leg swelling and dx with large DVT.    Initial heparin level therapeutic on 1000 units/hr.  Goal of Therapy:  Heparin level 0.3-0.7 units/ml Monitor platelets by anticoagulation protocol: Yes   Plan:  Continue heparin at 1000 units/hr Heparin level in 6 hours for confirmation Heparin level and CBC daily while on heparin  [employer], Pharm.D., BCPS Clinical Pharmacist Pager: [phone] Clinical phone for 11/24/2016 from 8:30-4:00 is [phone]. After 4pm, please call Main [facility] [phone]) for assistance. 11/24/2016 3:40 PM

## 2016-11-24 NOTE — H&P (Signed)
History and Physical    [name] ION:[id number] DOB: 08/27/1940 DOA: 11/23/2016  PCP: Abner Greenspan, MD  Patient coming from: Home  I have personally briefly reviewed patient's old medical records in [place]  Chief Complaint: L leg swelling  HPI: [name] is a 76 y.o. female with medical history significant of hemicolectomy for colon cancer on 9/18.  On DVT ppx lovenox post op.  Post op course complicated by ileus.  Discharged yesterday.  This morning woke up with acute onset of L lower extremity swelling and pain.  She presents to the ED for concern of DVT.  Had some episodes of vomiting later in day, no CP, no SOB.   ED Course: Has extensive DVT in LLE including external iliac, common femoral, femoral, profunda femoral, popliteal, posterior tibial, peroneal veins, and intramuscular deep vein thrombosis of the gastrocnemius veins of the left lower extremity.   Review of Systems: As per HPI otherwise 10 point review of systems negative.   Past Medical History:  Diagnosis Date  . Anemia   . Arthritis   . Cancer ([place])    colon  . Coronary artery disease, non-occlusive   . Dyspnea    more so exertion  . Dysrhythmia    skipped beats every now and then  . GERD (gastroesophageal reflux disease)   . Heel spur   . HSV (herpes simplex virus) infection   . Hyperglycemia   . Hyperlipidemia   . Hypertension   . IBS (irritable bowel syndrome)   . IC (interstitial cystitis)   . Insomnia disorder related to known organic factor   . Osteoporosis   . PONV (postoperative nausea and vomiting)    has had more surgeries, and had no problems  . Stroke ([place])    2018    . Vitamin D deficiency     Past Surgical History:  Procedure Laterality Date  . ABDOMINAL HYSTERECTOMY    . CARDIAC CATHETERIZATION  7/12   non obst dz -- not sure what yr  . CHOLECYSTECTOMY    . EYE SURGERY    . LAPAROSCOPIC RIGHT COLECTOMY Right 11/10/2016   Procedure:  LAPAROSCOPIC EXTENDED RIGHT COLECTOMY;  Surgeon: [name], MD;  Location: [facility];  Service: General;  Laterality: Right;  . ROTATOR CUFF REPAIR     2 TIMES     reports that she has never smoked. She has never used smokeless tobacco. She reports that she drinks alcohol. She reports that she does not use drugs.  Allergies  Allergen Reactions  . Salmon [Fish Allergy] Anaphylaxis    This happens with "CANNED SALMON" only, if eaten uncooked  . Ampicillin Nausea Only  . Codeine Nausea And Vomiting  . Flagyl [Metronidazole] Nausea And Vomiting    Patient was taking this along with Neomycin and could not differentiate which med triggered the nausea & vomiting  . Meperidine Hcl Nausea And Vomiting  . Neomycin Nausea And Vomiting    Patient was taking this along with Flagyl and could not differentiate which med triggered the nausea & vomiting  . Penicillins Other (See Comments)    Has patient had a PCN reaction causing immediate rash, facial/tongue/throat swelling, SOB or lightheadedness with hypotension: Unknown Has patient had a PCN reaction causing severe rash involving mucus membranes or skin necrosis: No Has patient had a PCN reaction that required hospitalization: No Has patient had a PCN reaction occurring within the last 10 years: No If all of the above answers are "NO",  then may proceed with Cephalosporin use.   Marland Kitchen Propoxyphene Hcl Nausea And Vomiting  . Sulfa Antibiotics Hives  . Tramadol Hcl Nausea And Vomiting    Family History  Problem Relation Age of Onset  . Alcohol abuse Mother   . Heart attack Mother   . GER disease Mother   . GER disease Father   . Alcohol abuse Father   . Stroke Father   . Tuberculosis Father   . Heart attack Father   . Aneurysm Father   . Mental illness Sister   . Depression Sister   . Breast cancer Neg Hx      Prior to Admission medications   Medication Sig Start Date End Date Taking? Authorizing Provider  atorvastatin (LIPITOR) 40 MG  tablet Take 1 tablet (40 mg total) by mouth daily at 6 PM. 07/30/16  Yes Tower, [name], MD  b complex vitamins tablet Take 1 tablet by mouth daily.   Yes [provider]  Ca Carbonate-Mag Hydroxide (ROLAIDS PO) Take 1-2 tablets by mouth every 8 (eight) hours as needed (for acid reflux).    Yes [provider]  cholecalciferol (VITAMIN D) 1000 units tablet Take 2,000 Units by mouth daily.    Yes [provider]  COMBIGAN 0.2-0.5 % ophthalmic solution Place 1 drop into both eyes at bedtime.    Yes [provider]  LUMIGAN 0.01 % SOLN Place 1 drop into both eyes every morning.    Yes [provider]  Multiple Vitamin (MULTIVITAMIN WITH MINERALS) TABS tablet Take 1 tablet by mouth daily.   Yes [provider]  aspirin EC 81 MG tablet Take 81 mg by mouth at bedtime.    [provider]  gabapentin (NEURONTIN) 100 MG capsule Take 1 capsule (100 mg total) by mouth 2 (two) times daily as needed. Patient taking differently: Take 100 mg by mouth 2 (two) times daily as needed (for nerve pain).  11/22/16   [name], MD  valACYclovir (VALTREX) 500 MG tablet TAKE 1 TABLET (500 MG TOTAL) BY MOUTH 2 (TWO) TIMES DAILY. 10/07/16   Abner Greenspan, MD    Physical Exam: Vitals:   11/23/16 2100 11/23/16 2130 11/23/16 2200 11/23/16 2230  BP: 113/63 123/67 (!) 164/87 (!) 125/57  Pulse: 92 79 93 88  Resp: 15 17 17 15   Temp:      TempSrc:      SpO2: 97% 98% 100% 98%  Weight:      Height:        Constitutional: NAD, calm, comfortable Eyes: PERRL, lids and conjunctivae normal ENMT: Mucous membranes are moist. Posterior pharynx clear of any exudate or lesions.Normal dentition.  Neck: normal, supple, no masses, no thyromegaly Respiratory: clear to auscultation bilaterally, no wheezing, no crackles. Normal respiratory effort. No accessory muscle use.  Cardiovascular: Regular rate and rhythm, no murmurs / rubs / gallops. No extremity edema. 2+ pedal  pulses. No carotid bruits.  Abdomen: no tenderness, no masses palpated. No hepatosplenomegaly. Bowel sounds positive.  Musculoskeletal: L leg swollen compared to R Skin: no rashes, lesions, ulcers. No induration Neurologic: CN 2-12 grossly intact. Sensation intact, DTR normal. Strength 5/5 in all 4.  Psychiatric: Normal judgment and insight. Alert and oriented x 3. Normal mood.    Labs on Admission: I have personally reviewed following labs and imaging studies  CBC:  Recent Labs Lab 11/22/16 0641 11/23/16 1805  WBC 8.3 14.9*  NEUTROABS 5.3  --   HGB 9.5* 11.0*  HCT 30.2* 34.7*  MCV 86.0 85.5  PLT 231 967   Basic Metabolic Panel:  Recent Labs Lab 11/17/16 0532 11/18/16 0405 11/19/16 0418 11/22/16 0641 11/23/16 1805  NA 135 135 135 136 129*  K 4.0 3.9 4.2 4.2 3.9  CL 101 103 104 105 98*  CO2 26 26 27 27 24   GLUCOSE 118* 113* 114* 122* 152*  BUN 8 14 17 18 19   CREATININE 0.84 0.76 0.71 0.75 0.96  CALCIUM 8.1* 8.4* 8.5* 8.6* 9.1  MG 2.2 2.1 2.2 2.0  --   PHOS 3.8 3.7 3.3 3.3  --    GFR: Estimated Creatinine Clearance: 44.6 mL/min (by C-G formula based on SCr of 0.96 mg/dL). Liver Function Tests:  Recent Labs Lab 11/18/16 0405 11/22/16 0641  AST 26 16  ALT 22 22  ALKPHOS 69 74  BILITOT 0.4 0.5  PROT 5.8* 5.5*  ALBUMIN 2.6* 2.6*   No results for input(s): LIPASE, AMYLASE in the last 168 hours. No results for input(s): AMMONIA in the last 168 hours. Coagulation Profile: No results for input(s): INR, PROTIME in the last 168 hours. Cardiac Enzymes: No results for input(s): CKTOTAL, CKMB, CKMBINDEX, TROPONINI in the last 168 hours. BNP (last 3 results) No results for input(s): PROBNP in the last 8760 hours. HbA1C: No results for input(s): HGBA1C in the last 72 hours. CBG:  Recent Labs Lab 11/18/16 0015 11/18/16 0439 11/18/16 0747 11/18/16 1213 11/18/16 1625  GLUCAP 130* 115* 87 110* 112*   Lipid Profile:  Recent Labs  11/22/16 0641  TRIG 122     Thyroid Function Tests: No results for input(s): TSH, T4TOTAL, FREET4, T3FREE, THYROIDAB in the last 72 hours. Anemia Panel: No results for input(s): VITAMINB12, FOLATE, FERRITIN, TIBC, IRON, RETICCTPCT in the last 72 hours. Urine analysis:    Component Value Date/Time   COLORURINE YELLOW 11/19/2016 [place] 11/19/2016 1047   LABSPEC 1.008 11/19/2016 1047   PHURINE 6.0 11/19/2016 1047   GLUCOSEU NEGATIVE 11/19/2016 1047   HGBUR NEGATIVE 11/19/2016 1047   HGBUR trace-intact 08/01/2009 1421   BILIRUBINUR NEGATIVE 11/19/2016 1047   BILIRUBINUR Neg 08/26/2016 1251   KETONESUR NEGATIVE 11/19/2016 1047   PROTEINUR NEGATIVE 11/19/2016 1047   UROBILINOGEN 0.2 08/26/2016 1251   UROBILINOGEN 0.2 08/01/2009 1421   NITRITE NEGATIVE 11/19/2016 1047   LEUKOCYTESUR NEGATIVE 11/19/2016 1047    Radiological Exams on Admission: Ct Angio Chest Pe W And/or Wo Contrast  Result Date: 11/23/2016 CLINICAL DATA:  Nausea and vomiting with swelling in the feet. Recent surgery from September 19th. Right colectomy. EXAM: CT ANGIOGRAPHY CHEST WITH CONTRAST TECHNIQUE: Multidetector CT imaging of the chest was performed using the standard protocol during bolus administration of intravenous contrast. Multiplanar CT image reconstructions and MIPs were obtained to evaluate the vascular anatomy. CONTRAST:  100 mL Isovue 370 COMPARISON:  10/04/2016 PET-CT FINDINGS: Cardiovascular: Satisfactory opacification of the pulmonary arteries to the segmental level. No evidence of pulmonary embolism. Normal heart size. No pericardial effusion. Normal caliber thoracic aorta. No evidence of aortic dissection. Scattered calcifications in the aorta and coronary arteries. Great vessel origins are patent. Mediastinum/Nodes: No enlarged mediastinal, hilar, or axillary lymph nodes. Thyroid gland, trachea, and esophagus demonstrate no significant findings. Lungs/Pleura: Mild dependent changes in the lung bases. No airspace  disease or consolidation in the lungs. Scattered peripheral nodules in the right lung, all measuring less than 4 mm. No significant changes. No pleural effusions. No pneumothorax. Upper Abdomen: Surgical absence of the gallbladder. No bile duct dilatation. Musculoskeletal: Degenerative changes in  the spine. Benign-appearing sclerosis in a midthoracic vertebra. Schmorl's nodes. No destructive bone lesions. Review of the MIP images confirms the above findings. IMPRESSION: No evidence of significant pulmonary embolus. No evidence of active pulmonary disease. Aortic Atherosclerosis (ICD10-I70.0). Electronically Signed   By: Lucienne Capers M.D.   On: 11/23/2016 22:14    EKG: Independently reviewed.  Assessment/Plan Principal Problem:   Acute deep vein thrombosis (DVT) of proximal vein of left lower extremity (HCC) Active Problems:   Cancer of overlapping sites of colon ([place])    1. DVT - 1. Heparin gtt 2. Dr. Trula Slade said to admit and vascular surgery would consult in AM 3. Will let patient eat for the moment since no specific procedure planned yet. 2. Colon cancer - s/p resection 1. 1 out of 60 nodes positive 2. Liver mass was apparently believed to be non cancerous after PET scan on 8/13? (see 8/15 office note and Dr. Marlowe Aschoff notes from admission).  DVT prophylaxis: Heparin gtt Code Status: Full Family Communication: Family at bedside Disposition Plan: Home after admit Consults called: Spoke with Dr. Trula Slade in ED Admission status: Place in obs   Allanah Mcfarland, [facility] Pager [phone]  If 7AM-7PM, please contact day team taking care of patient [email or web address] Password [email or web address]  11/24/2016, 1:22 AM

## 2016-11-25 ENCOUNTER — Inpatient Hospital Stay (HOSPITAL_COMMUNITY): Payer: Medicare HMO

## 2016-11-25 DIAGNOSIS — Z86718 Personal history of other venous thrombosis and embolism: Secondary | ICD-10-CM

## 2016-11-25 DIAGNOSIS — I82502 Chronic embolism and thrombosis of unspecified deep veins of left lower extremity: Secondary | ICD-10-CM

## 2016-11-25 LAB — CBC
HCT: 30.3 % — ABNORMAL LOW (ref 36.0–46.0)
Hemoglobin: 10.1 g/dL — ABNORMAL LOW (ref 12.0–15.0)
MCH: 28.3 pg (ref 26.0–34.0)
MCHC: 33.3 g/dL (ref 30.0–36.0)
MCV: 84.9 fL (ref 78.0–100.0)
Platelets: 221 10*3/uL (ref 150–400)
RBC: 3.57 MIL/uL — ABNORMAL LOW (ref 3.87–5.11)
RDW: 14.9 % (ref 11.5–15.5)
WBC: 11.3 10*3/uL — ABNORMAL HIGH (ref 4.0–10.5)

## 2016-11-25 LAB — BASIC METABOLIC PANEL
Anion gap: 8 (ref 5–15)
BUN: 15 mg/dL (ref 6–20)
CO2: 26 mmol/L (ref 22–32)
Calcium: 8.6 mg/dL — ABNORMAL LOW (ref 8.9–10.3)
Chloride: 98 mmol/L — ABNORMAL LOW (ref 101–111)
Creatinine, Ser: 0.85 mg/dL (ref 0.44–1.00)
GFR calc Af Amer: >60 mL/min (ref >60)
GFR calc non Af Amer: >60 mL/min (ref >60)
Glucose, Bld: 116 mg/dL — ABNORMAL HIGH (ref 65–99)
Potassium: 3.9 mmol/L (ref 3.5–5.1)
Sodium: 132 mmol/L — ABNORMAL LOW (ref 135–145)

## 2016-11-25 LAB — HEPARIN LEVEL (UNFRACTIONATED)
Heparin Unfractionated: 0.25 IU/mL — ABNORMAL LOW (ref 0.30–0.70)
Heparin Unfractionated: 0.45 IU/mL (ref 0.30–0.70)

## 2016-11-25 MED ORDER — PROMETHAZINE HCL 25 MG/ML IJ SOLN
12.5000 mg | Freq: Four times a day (QID) | INTRAMUSCULAR | Status: DC | PRN
  Administered 2016-11-25 – 2016-11-26 (×2): 12.5 mg via INTRAVENOUS
  Filled 2016-11-25 (×3): qty 1

## 2016-11-25 MED ORDER — BISACODYL 5 MG PO TBEC
10.0000 mg | DELAYED_RELEASE_TABLET | Freq: Once | ORAL | Status: AC
  Administered 2016-11-25: 10 mg via ORAL
  Filled 2016-11-25: qty 2

## 2016-11-25 MED ORDER — FAMOTIDINE IN NACL 20-0.9 MG/50ML-% IV SOLN
20.0000 mg | Freq: Once | INTRAVENOUS | Status: AC
  Administered 2016-11-25: 20 mg via INTRAVENOUS
  Filled 2016-11-25: qty 50

## 2016-11-25 MED ORDER — PROMETHAZINE HCL 25 MG/ML IJ SOLN
12.5000 mg | Freq: Once | INTRAMUSCULAR | Status: AC
  Administered 2016-11-25: 12.5 mg via INTRAVENOUS
  Filled 2016-11-25: qty 1

## 2016-11-25 MED ORDER — FAMOTIDINE IN NACL 20-0.9 MG/50ML-% IV SOLN
20.0000 mg | Freq: Two times a day (BID) | INTRAVENOUS | Status: DC
  Administered 2016-11-25 – 2016-11-26 (×3): 20 mg via INTRAVENOUS
  Filled 2016-11-25 (×4): qty 50

## 2016-11-25 NOTE — Progress Notes (Signed)
Preliminary results: IVC/ iliac vein duplex  Occlusive DVT of the left mid to distal iliac vein. IVC and right iliac appear patent.   Landry Mellow, RDMS, RVT 11/25/2016

## 2016-11-25 NOTE — Progress Notes (Signed)
PROGRESS NOTE [facility]   [place]   BJY:[id number] DOB: 21-Jan-1941  DOA: 11/23/2016 PCP: Abner Greenspan, MD   Brief Narrative:  [name] is a 76 year old female with a medical history of hemicolectomy for colon cancer on 9/18 with postop ileus discharged on 10/1, HSV and hyperlipidemia. She presented to the ED on 10/2 with acute left leg swelling and "tightness" and was diagnosed with a large DVT involving external iliac, common femoral, femoral, profunda femoral, popliteal, posterior tibial, peroneal, and intramuscular gastrocnemius veins. She was started on heparin and VVS consulted. Ilio-caval [place] 10/4 revealed thrombus continues to left distal/mid iliac, but not into the IVC or right iliac artery.   Subjective: Patient reports pain in left leg and tightness. "Herpes" ulceration on her back/sacrum still causing her pain, but she states she was very nauseous after taking the valtrex yesterday. Nausea has continued in to today with dry-heaving. No vomiting, diarrhea, chest pain, or shortness of breath.   Assessment & Plan:   Principal Problem:   Acute deep vein thrombosis (DVT) of proximal vein of left lower extremity (HCC) Active Problems:   Cancer of overlapping sites of colon ([place])  Acute DVT of left lower extremity -Recent GI operation and post op complication leading to prolonged hospitalization. No family history or personal history of clotting disorders.  -DVT involving involving external iliac, common femoral, femoral, profunda femoral, popliteal, posterior tibial, peroneal, and intramuscular gastrocnemius veins -CTA Chest 10/2: no evidence of PE -Started on heparin. VVS consulted for further management.   Herpes simplex virus on left buttock/sacrum -Patient reports no other breakouts that she's aware of -Took valtrex yesterday, and patient states it made her nauseous. She wants to hold off on treatment until she can get her home medication.     Colon cancer s/p hemicolectomy -Surgery on 0/86 complicated with post-op ileus. Discharged 10/1.  -Was supposed to have a follow up appointment this week with GI but will need to reschedule -Has not had a bowel movement since she's been here- ordered ducolax -Nausea since last night, improved with one dose of pepcid- continue pepcid and zofran as scheduled.  Hyperlipidemia -Continue statin therapy  DVT prophylaxis: Heparin Code Status: Full Family Communication: None at bedside.  Disposition Plan: Once medically stable  Consultants:   VVS  Procedures:   Ilio-caval [place] 10/4: Occlusive DVT of the left mid to distal iliac vein. IVC and right iliac appear patent.  Antimicrobials:  None   Objective: Vitals:   11/24/16 0552 11/24/16 1408 11/24/16 2125 11/25/16 0511  BP: 132/65 103/61 (!) 121/59 (!) 116/55  Pulse: 94 83 96 80  Resp: 16 16 18 18   Temp: 98.4 F (36.9 C) 98.7 F (37.1 C) 99 F (37.2 C) 99.3 F (37.4 C)  TempSrc: Oral Oral Oral Oral  SpO2: 98% 100% 97% 97%  Weight:      Height:        Intake/Output Summary (Last 24 hours) at 11/25/16 1326 Last data filed at 11/25/16 0907  Gross per 24 hour  Intake            643.5 ml  Output             1315 ml  Net           -671.5 ml   Filed Weights   11/23/16 1752 11/24/16 0351  Weight: 67.6 kg (149 lb) 67.6 kg (149 lb 0.5 oz)   Examination: General exam: Appears calm and comfortable, somnolent after  taking anti-nausea medication.  Respiratory system: Clear to auscultation. No wheezes,crackle or rhonchi. No increased work of breathing.  Cardiovascular system: S1 & S2 heard, RRR. No murmurs, rubs or gallops Gastrointestinal system: Abdomen nondistended and soft. Mild soreness due to recent operation. Normal bowel sounds heard. Central nervous system: Alert and oriented. No focal neurological deficits. Extremities: left leg swelling and increased warmth from foot to thigh. 2+ peripheral pulses bilaterally.   Skin: Dime-sized area of small blisters on left buttock that she reports is "herpes"  Data Reviewed: I have personally reviewed following labs and imaging studies  CBC:  Recent Labs Lab 11/22/16 0641 11/23/16 1805 11/25/16 0658  WBC 8.3 14.9* 11.3*  NEUTROABS 5.3  --   --   HGB 9.5* 11.0* 10.1*  HCT 30.2* 34.7* 30.3*  MCV 86.0 85.5 84.9  PLT 231 259 101   Basic Metabolic Panel:  Recent Labs Lab 11/19/16 0418 11/22/16 0641 11/23/16 1805 11/25/16 0658  NA 135 136 129* 132*  K 4.2 4.2 3.9 3.9  CL 104 105 98* 98*  CO2 27 27 24 26   GLUCOSE 114* 122* 152* 116*  BUN 17 18 19 15   CREATININE 0.71 0.75 0.96 0.85  CALCIUM 8.5* 8.6* 9.1 8.6*  MG 2.2 2.0  --   --   PHOS 3.3 3.3  --   --    GFR: Estimated Creatinine Clearance: 50.4 mL/min (by C-G formula based on SCr of 0.85 mg/dL). Liver Function Tests:  Recent Labs Lab 11/22/16 0641  AST 16  ALT 22  ALKPHOS 74  BILITOT 0.5  PROT 5.5*  ALBUMIN 2.6*   CBG:  Recent Labs Lab 11/18/16 1625  GLUCAP 112*   Radiology Studies: Ct Angio Chest Pe W And/or Wo Contrast  Result Date: 11/23/2016 CLINICAL DATA:  Nausea and vomiting with swelling in the feet. Recent surgery from September 19th. Right colectomy. EXAM: CT ANGIOGRAPHY CHEST WITH CONTRAST TECHNIQUE: Multidetector CT imaging of the chest was performed using the standard protocol during bolus administration of intravenous contrast. Multiplanar CT image reconstructions and MIPs were obtained to evaluate the vascular anatomy. CONTRAST:  100 mL Isovue 370 COMPARISON:  10/04/2016 PET-CT FINDINGS: Cardiovascular: Satisfactory opacification of the pulmonary arteries to the segmental level. No evidence of pulmonary embolism. Normal heart size. No pericardial effusion. Normal caliber thoracic aorta. No evidence of aortic dissection. Scattered calcifications in the aorta and coronary arteries. Great vessel origins are patent. Mediastinum/Nodes: No enlarged mediastinal, hilar, or  axillary lymph nodes. Thyroid gland, trachea, and esophagus demonstrate no significant findings. Lungs/Pleura: Mild dependent changes in the lung bases. No airspace disease or consolidation in the lungs. Scattered peripheral nodules in the right lung, all measuring less than 4 mm. No significant changes. No pleural effusions. No pneumothorax. Upper Abdomen: Surgical absence of the gallbladder. No bile duct dilatation. Musculoskeletal: Degenerative changes in the spine. Benign-appearing sclerosis in a midthoracic vertebra. Schmorl's nodes. No destructive bone lesions. Review of the MIP images confirms the above findings. IMPRESSION: No evidence of significant pulmonary embolus. No evidence of active pulmonary disease. Aortic Atherosclerosis (ICD10-I70.0). Electronically Signed   By: Lucienne Capers M.D.   On: 11/23/2016 22:14   Scheduled Meds: . atorvastatin  40 mg Oral q1800  . B-complex with vitamin C  1 tablet Oral Daily  . brimonidine  1 drop Both Eyes QHS   And  . timolol  1 drop Both Eyes QHS  . cholecalciferol  2,000 Units Oral Daily  . latanoprost  1 drop Both Eyes Daily  .  multivitamin with minerals  1 tablet Oral Daily  . polyethylene glycol  17 g Oral Daily  . valACYclovir  500 mg Oral BID   Continuous Infusions: . famotidine (PEPCID) IV    . heparin 1,100 Units/hr (11/25/16 1038)     LOS: 1 day   Time spent: Total of 25 minutes spent with pt, greater than 50% of which was spent in discussion of  treatment, counseling and coordination of care  Minda Ditto, PA-S   Pager: Text Page via [email or web address]  If 7PM-7AM, please contact night-coverage [email or web address] 11/25/2016

## 2016-11-25 NOTE — Progress Notes (Addendum)
Daughter called for second time requesting an update, I have previously spoke to Dr. Quincy Simmonds and he will update daughter as soon as test result is in, daughter is very concerned at this time and requesting call back from charge nurse or Surveyor, quantity.   Update 2:30:  [place]ham at bedside and will call to update daughter.

## 2016-11-25 NOTE — Progress Notes (Signed)
ANTICOAGULATION CONSULT NOTE - Follow Up Consult  Pharmacy Consult for Heparin Indication: DVT  Allergies  Allergen Reactions  . Salmon [Fish Allergy] Anaphylaxis    This happens with "CANNED SALMON" only, if eaten uncooked  . Ampicillin Nausea Only  . Codeine Nausea And Vomiting  . Flagyl [Metronidazole] Nausea And Vomiting    Patient was taking this along with Neomycin and could not differentiate which med triggered the nausea & vomiting  . Meperidine Hcl Nausea And Vomiting  . Neomycin Nausea And Vomiting    Patient was taking this along with Flagyl and could not differentiate which med triggered the nausea & vomiting  . Penicillins Other (See Comments)    Has patient had a PCN reaction causing immediate rash, facial/tongue/throat swelling, SOB or lightheadedness with hypotension: Unknown Has patient had a PCN reaction causing severe rash involving mucus membranes or skin necrosis: No Has patient had a PCN reaction that required hospitalization: No Has patient had a PCN reaction occurring within the last 10 years: No If all of the above answers are "NO", then may proceed with Cephalosporin use.   Marland Kitchen Propoxyphene Hcl Nausea And Vomiting  . Sulfa Antibiotics Hives  . Tramadol Hcl Nausea And Vomiting    Patient Measurements: Height: 5\' 3"  (160 cm) Weight: 149 lb 0.5 oz (67.6 kg) IBW/kg (Calculated) : 52.4 Heparin Dosing Weight: 67.6 kg  Vital Signs: Temp: 99.3 F (37.4 C) (10/04 0511) Temp Source: Oral (10/04 0511) BP: 116/55 (10/04 0511) Pulse Rate: 80 (10/04 0511)  Labs:  Recent Labs  11/23/16 1805 11/24/16 1405 11/24/16 1924 11/25/16 0658  HGB 11.0*  --   --  10.1*  HCT 34.7*  --   --  30.3*  PLT 259  --   --  221  HEPARINUNFRC  --  0.53 0.48 0.25*  CREATININE 0.96  --   --  0.85    Estimated Creatinine Clearance: 50.4 mL/min (by C-G formula based on SCr of 0.85 mg/dL).   Medications:  Scheduled:  . atorvastatin  40 mg Oral q1800  . B-complex with  vitamin C  1 tablet Oral Daily  . brimonidine  1 drop Both Eyes QHS   And  . timolol  1 drop Both Eyes QHS  . cholecalciferol  2,000 Units Oral Daily  . latanoprost  1 drop Both Eyes Daily  . multivitamin with minerals  1 tablet Oral Daily  . polyethylene glycol  17 g Oral Daily  . valACYclovir  500 mg Oral BID   Infusions:  . heparin 1,000 Units/hr (11/25/16 0630)    Assessment: 76 yo F with colon cancer s/p hemicolectomy on 9/18, developed post-op ileus, discharged 10/1.  Presented to ED with acute left leg swelling and dx with large DVT.    Repeat heparin level is slightly sub-therapeutic at 0.25 on 1000 units/hr. RN reports no s/s of bleeding and no problems with the heparin drip.  Goal of Therapy:  Heparin level 0.3-0.7 units/ml Monitor platelets by anticoagulation protocol: Yes   Plan:  Increase heparin to 1150 units/hr Heparin level and CBC daily while on heparin Monitor for s/s of bleeding    Thank you for allowing [place] to participate in this patients care.  Jens Som, PharmD Clinical phone for 11/25/2016 from 7a-3:30p: [phone] If after 3:30p, please call main pharmacy at: [phone] 11/25/2016 10:24 AM

## 2016-11-25 NOTE — Progress Notes (Signed)
ANTICOAGULATION CONSULT NOTE - Follow Up Consult  Pharmacy Consult for Heparin Indication: DVT  Allergies  Allergen Reactions  . Salmon [Fish Allergy] Anaphylaxis    This happens with "CANNED SALMON" only, if eaten uncooked  . Ampicillin Nausea Only  . Codeine Nausea And Vomiting  . Flagyl [Metronidazole] Nausea And Vomiting    Patient was taking this along with Neomycin and could not differentiate which med triggered the nausea & vomiting  . Meperidine Hcl Nausea And Vomiting  . Neomycin Nausea And Vomiting    Patient was taking this along with Flagyl and could not differentiate which med triggered the nausea & vomiting  . Penicillins Other (See Comments)    Has patient had a PCN reaction causing immediate rash, facial/tongue/throat swelling, SOB or lightheadedness with hypotension: Unknown Has patient had a PCN reaction causing severe rash involving mucus membranes or skin necrosis: No Has patient had a PCN reaction that required hospitalization: No Has patient had a PCN reaction occurring within the last 10 years: No If all of the above answers are "NO", then may proceed with Cephalosporin use.   Marland Kitchen Propoxyphene Hcl Nausea And Vomiting  . Sulfa Antibiotics Hives  . Tramadol Hcl Nausea And Vomiting    Patient Measurements: Height: 5\' 3"  (160 cm) Weight: 149 lb 0.5 oz (67.6 kg) IBW/kg (Calculated) : 52.4 Heparin Dosing Weight: 67.6 kg  Vital Signs:    Labs:  Recent Labs  11/23/16 1805  11/24/16 1924 11/25/16 0658 11/25/16 1830  HGB 11.0*  --   --  10.1*  --   HCT 34.7*  --   --  30.3*  --   PLT 259  --   --  221  --   HEPARINUNFRC  --   < > 0.48 0.25* 0.45  CREATININE 0.96  --   --  0.85  --   < > = values in this interval not displayed.  Estimated Creatinine Clearance: 50.4 mL/min (by C-G formula based on SCr of 0.85 mg/dL).   Medications:  Scheduled:  . atorvastatin  40 mg Oral q1800  . B-complex with vitamin C  1 tablet Oral Daily  . brimonidine  1 drop  Both Eyes QHS   And  . timolol  1 drop Both Eyes QHS  . cholecalciferol  2,000 Units Oral Daily  . latanoprost  1 drop Both Eyes Daily  . multivitamin with minerals  1 tablet Oral Daily  . polyethylene glycol  17 g Oral Daily   Infusions:  . famotidine (PEPCID) IV    . heparin 1,100 Units/hr (11/25/16 1038)    Assessment: 76 yo F with colon cancer s/p hemicolectomy on 9/18, developed post-op ileus, discharged 10/1.  Presented to ED with acute left leg swelling and dx with large DVT.   Heparin level is within therapeutic range at 0.45 on 1100 units/hr. No issues noted per nursing. No signs/symptoms of bleeding noted.   Goal of Therapy:  Heparin level 0.3-0.7 units/ml Monitor platelets by anticoagulation protocol: Yes   Plan:  Continue heparin at 1100 units/hr Heparin level and CBC daily while on heparin Monitor for s/s of bleeding  Follow-up plans for outpatient anticoagulant   Thank you for allowing [place] to participate in this patients care.  Doylene Canard, PharmD Clinical Pharmacist  Phone: [phone] 11/25/2016 7:58 PM

## 2016-11-25 NOTE — Telephone Encounter (Signed)
Called pt and she said she bought one so she doesn't need this order. Order destroyed

## 2016-11-25 NOTE — Consult Note (Signed)
[facility] [place]  Patient name: [name] MRN: [id number] DOB: 1940/08/27 Sex: female   REQUESTING PROVIDER:    ER   REASON FOR CONSULT:    DVT  HISTORY OF PRESENT ILLNESS:   [name] is a 76 y.o. female, who was admitted with left leg swelling.  She was diagnosed with a DVT and heparin was started.  She is recently s/p partial colectomy for cancer.  Her post operative course was complicated by an ileus, and she remains nauseated, but is passing flatus.  She is medically managed for HTN.  She is on a statin for hypercholesterolemia.  PAST MEDICAL HISTORY    Past Medical History:  Diagnosis Date  . Anemia   . Arthritis   . Cancer ([place])    colon  . Coronary artery disease, non-occlusive   . Dyspnea    more so exertion  . Dysrhythmia    skipped beats every now and then  . GERD (gastroesophageal reflux disease)   . Heel spur   . HSV (herpes simplex virus) infection   . Hyperglycemia   . Hyperlipidemia   . Hypertension   . IBS (irritable bowel syndrome)   . IC (interstitial cystitis)   . Insomnia disorder related to known organic factor   . Osteoporosis   . PONV (postoperative nausea and vomiting)    has had more surgeries, and had no problems  . Stroke ([place])    2018    . Vitamin D deficiency      FAMILY HISTORY   Family History  Problem Relation Age of Onset  . Alcohol abuse Mother   . Heart attack Mother   . GER disease Mother   . GER disease Father   . Alcohol abuse Father   . Stroke Father   . Tuberculosis Father   . Heart attack Father   . Aneurysm Father   . Mental illness Sister   . Depression Sister   . Breast cancer Neg Hx     SOCIAL HISTORY:   Social History   Social History  . Marital status: Married    Spouse name: N/A  . Number of children: 4  . Years of education: N/A   Occupational History  . reitred Retired   Social History Main Topics  .  Smoking status: Never Smoker  . Smokeless tobacco: Never Used  . Alcohol use 0.0 oz/week     Comment: rare  . Drug use: No  . Sexual activity: Not on file   Other Topics Concern  . Not on file   Social History Narrative   Rides horses         Specialty providers    [facility]   Cardio- [facility]   Ortho--[place]   Rheum-[facility]   urol-[facility]   [facility]-- [facility]    ALLERGIES:    Allergies  Allergen Reactions  . Salmon [Fish Allergy] Anaphylaxis    This happens with "CANNED SALMON" only, if eaten uncooked  . Ampicillin Nausea Only  . Codeine Nausea And Vomiting  . Flagyl [Metronidazole] Nausea And Vomiting    Patient was taking this along with Neomycin and could not differentiate which med triggered the nausea & vomiting  . Meperidine Hcl Nausea And Vomiting  . Neomycin Nausea And Vomiting    Patient was taking this along with Flagyl and could not differentiate which med triggered the nausea & vomiting  . Penicillins Other (See Comments)    Has patient had a PCN  reaction causing immediate rash, facial/tongue/throat swelling, SOB or lightheadedness with hypotension: Unknown Has patient had a PCN reaction causing severe rash involving mucus membranes or skin necrosis: No Has patient had a PCN reaction that required hospitalization: No Has patient had a PCN reaction occurring within the last 10 years: No If all of the above answers are "NO", then may proceed with Cephalosporin use.   Marland Kitchen Propoxyphene Hcl Nausea And Vomiting  . Sulfa Antibiotics Hives  . Tramadol Hcl Nausea And Vomiting    CURRENT MEDICATIONS:    Current Facility-Administered Medications  Medication Dose Route Frequency Provider Last Rate Last Dose  . acetaminophen (TYLENOL) tablet 650 mg  650 mg Oral Q6H PRN Etta Quill, DO       Or  . acetaminophen (TYLENOL) suppository 650 mg  650 mg Rectal Q6H PRN Etta Quill, DO      . atorvastatin (LIPITOR) tablet 40 mg  40 mg Oral q1800 Etta Quill, DO   40 mg at 11/24/16 1851  . B-complex with vitamin C tablet 1 tablet  1 tablet Oral Daily Jennette Kettle M, DO   1 tablet at 11/24/16 1122  . brimonidine (ALPHAGAN) 0.2 % ophthalmic solution 1 drop  1 drop Both Eyes QHS Jennette Kettle M, DO   1 drop at 11/24/16 2151   And  . timolol (TIMOPTIC) 0.5 % ophthalmic solution 1 drop  1 drop Both Eyes QHS Jennette Kettle M, DO   1 drop at 11/24/16 2152  . calcium carbonate (TUMS - dosed in mg elemental calcium) chewable tablet 500 mg  500 mg Oral Q8H PRN Etta Quill, DO      . cholecalciferol (VITAMIN D) tablet 2,000 Units  2,000 Units Oral Daily Jennette Kettle M, DO   2,000 Units at 11/24/16 1120  . famotidine (PEPCID) IVPB 20 mg premix  20 mg Intravenous Once Patrecia Pour, Christean Grief, MD 100 mL/hr at 11/25/16 1430 20 mg at 11/25/16 1430  . gabapentin (NEURONTIN) capsule 100 mg  100 mg Oral BID PRN Etta Quill, DO      . heparin ADULT infusion 100 units/mL (25000 units/216mL sodium chloride 0.45%)  1,100 Units/hr Intravenous Continuous Patrecia Pour, Christean Grief, MD 11 mL/hr at 11/25/16 1038 1,100 Units/hr at 11/25/16 1038  . latanoprost (XALATAN) 0.005 % ophthalmic solution 1 drop  1 drop Both Eyes Daily Jennette Kettle M, DO   1 drop at 11/24/16 1127  . multivitamin with minerals tablet 1 tablet  1 tablet Oral Daily Jennette Kettle M, DO   1 tablet at 11/24/16 1122  . ondansetron (ZOFRAN) tablet 4 mg  4 mg Oral Q6H PRN Etta Quill, DO       Or  . ondansetron [facility]) injection 4 mg  4 mg Intravenous Q6H PRN Etta Quill, DO   4 mg at 11/25/16 1146  . polyethylene glycol (MIRALAX / GLYCOLAX) packet 17 g  17 g Oral Daily Patrecia Pour, Christean Grief, MD   17 g at 11/25/16 1343  . promethazine (PHENERGAN) injection 12.5 mg  12.5 mg Intravenous Q6H PRN Serafina Mitchell, MD   12.5 mg at 11/25/16 1428  . valACYclovir (VALTREX) tablet 500 mg  500 mg Oral BID Patrecia Pour, Christean Grief, MD   500 mg at 11/24/16 2150   Facility-Administered Medications Ordered  in Other Encounters  Medication Dose Route Frequency Provider Last Rate Last Dose  . gentamicin (GARAMYCIN) 340 mg in dextrose 5 % 50 mL IVPB  5 mg/kg Intravenous 60 min Pre-Op  [name], MD        REVIEW OF SYSTEMS:   [X]  denotes positive finding, [ ]  denotes negative finding Cardiac  Comments:  Chest pain or chest pressure:    Shortness of breath upon exertion:    Short of breath when lying flat:    Irregular heart rhythm:        Vascular    Pain in calf, thigh, or hip brought on by ambulation:    Pain in feet at night that wakes you up from your sleep:     Blood clot in your veins:    Leg swelling:  x       Pulmonary    Oxygen at home:    Productive cough:     Wheezing:         Neurologic    Sudden weakness in arms or legs:     Sudden numbness in arms or legs:     Sudden onset of difficulty speaking or slurred speech:    Temporary loss of vision in one eye:     Problems with dizziness:         Gastrointestinal    Blood in stool:      Vomited blood:         Genitourinary    Burning when urinating:     Blood in urine:        Psychiatric    Major depression:         Hematologic    Bleeding problems:    Problems with blood clotting too easily:        Skin    Rashes or ulcers:        Constitutional    Fever or chills:     PHYSICAL EXAM:   Vitals:   11/24/16 0552 11/24/16 1408 11/24/16 2125 11/25/16 0511  BP: 132/65 103/61 (!) 121/59 (!) 116/55  Pulse: 94 83 96 80  Resp: 16 16 18 18   Temp: 98.4 F (36.9 C) 98.7 F (37.1 C) 99 F (37.2 C) 99.3 F (37.4 C)  TempSrc: Oral Oral Oral Oral  SpO2: 98% 100% 97% 97%  Weight:      Height:        GENERAL: The patient is a well-nourished female, in no acute distress. The vital signs are documented above. CARDIAC: There is a regular rate and rhythm.  VASCULAR: mild left leg edema PULMONARY: Nonlabored respirations ABDOMEN: Soft and tender   MUSCULOSKELETAL: There are no major deformities or  cyanosis. NEUROLOGIC: No focal weakness or paresthesias are detected. SKIN: There are no ulcers or rashes noted. PSYCHIATRIC: The patient has a normal affect.  STUDIES:   I have reviewed her u/s with the following results: There is evidence of acute deep vein thrombosis involving the external iliac, common femoral, femoral, profunda femoral, popliteal, posterior tibial, peroneal veins, and intramuscular deep vein thrombosis of the gastrocnemius veins of the left lower extremity. There is also evidence of acute superficial vein thrombosis involving the greater saphenous vein of the left lower extremity. Occlusive DVT of the left mid to distal iliac vein. IVC and right iliac appear patent.  ASSESSMENT and PLAN   Extensive left leg DVT:  I spoke with the patient and the called the daughter and spoke with her for 30 minutes.  This is a very complicated situation.  I told her the standard of care is IV heparin with conversion to coumadin or NOAC.  She could potentially be a candidate for thrombolysis, however this is  not without potential complication.   With treatment, she would be at risk for intracranial hemorrhage, especially given her history of stroke.  She would be at risk for bleeding from recent surgery or potentially undiagnosed metastatic disease.    Currently, she is nauseated.  I owuld like for her to start feeling better prior to any procedure.  I have tried to contact [name] to get her input.  Continue IV heparin.  She can ambulate in the halls.  Keep her legs elevated while in bed.  If a procedure is recommended, it will be early next week.   Annamarie Major, MD [facility] [phone] Pager [phone]

## 2016-11-25 NOTE — Progress Notes (Signed)
Benefits check in process for Eliquis 10mg  twice a day x 7 days, then 5mg  twice a day. CM to f/u with disposition needs. Whitman Hero RN,BSN,CM

## 2016-11-26 ENCOUNTER — Inpatient Hospital Stay (HOSPITAL_COMMUNITY): Payer: Medicare HMO

## 2016-11-26 LAB — URINALYSIS, ROUTINE W REFLEX MICROSCOPIC
Bilirubin Urine: NEGATIVE
Glucose, UA: NEGATIVE mg/dL
Ketones, ur: NEGATIVE mg/dL
Nitrite: NEGATIVE
Protein, ur: 100 mg/dL — AB
Specific Gravity, Urine: 1.01 (ref 1.005–1.030)
Squamous Epithelial / LPF: NONE SEEN
pH: 6 (ref 5.0–8.0)

## 2016-11-26 LAB — CBC
HCT: 32.3 % — ABNORMAL LOW (ref 36.0–46.0)
Hemoglobin: 10.3 g/dL — ABNORMAL LOW (ref 12.0–15.0)
MCH: 27.1 pg (ref 26.0–34.0)
MCHC: 31.9 g/dL (ref 30.0–36.0)
MCV: 85 fL (ref 78.0–100.0)
Platelets: 280 10*3/uL (ref 150–400)
RBC: 3.8 MIL/uL — ABNORMAL LOW (ref 3.87–5.11)
RDW: 14.7 % (ref 11.5–15.5)
WBC: 8.8 10*3/uL (ref 4.0–10.5)

## 2016-11-26 LAB — HEPARIN LEVEL (UNFRACTIONATED): Heparin Unfractionated: 0.44 IU/mL (ref 0.30–0.70)

## 2016-11-26 MED ORDER — DEXTROSE 5 % IV SOLN
1.0000 g | INTRAVENOUS | Status: DC
  Administered 2016-11-26 – 2016-11-29 (×4): 1 g via INTRAVENOUS
  Filled 2016-11-26 (×5): qty 10

## 2016-11-26 MED ORDER — BISACODYL 10 MG RE SUPP
10.0000 mg | Freq: Once | RECTAL | Status: AC
  Administered 2016-11-26: 10 mg via RECTAL
  Filled 2016-11-26: qty 1

## 2016-11-26 NOTE — Progress Notes (Signed)
ANTICOAGULATION CONSULT NOTE - Follow Up Consult  Pharmacy Consult for Heparin Indication: DVT  Allergies  Allergen Reactions  . Salmon [Fish Allergy] Anaphylaxis    This happens with "CANNED SALMON" only, if eaten uncooked  . Ampicillin Nausea Only  . Codeine Nausea And Vomiting  . Flagyl [Metronidazole] Nausea And Vomiting    Patient was taking this along with Neomycin and could not differentiate which med triggered the nausea & vomiting  . Meperidine Hcl Nausea And Vomiting  . Neomycin Nausea And Vomiting    Patient was taking this along with Flagyl and could not differentiate which med triggered the nausea & vomiting  . Penicillins Other (See Comments)    Has patient had a PCN reaction causing immediate rash, facial/tongue/throat swelling, SOB or lightheadedness with hypotension: Unknown Has patient had a PCN reaction causing severe rash involving mucus membranes or skin necrosis: No Has patient had a PCN reaction that required hospitalization: No Has patient had a PCN reaction occurring within the last 10 years: No If all of the above answers are "NO", then may proceed with Cephalosporin use.   Marland Kitchen Propoxyphene Hcl Nausea And Vomiting  . Sulfa Antibiotics Hives  . Tramadol Hcl Nausea And Vomiting    Patient Measurements: Height: 5\' 3"  (160 cm) Weight: 149 lb 0.5 oz (67.6 kg) IBW/kg (Calculated) : 52.4 Heparin Dosing Weight: 67.6 kg  Vital Signs: Temp: 99 F (37.2 C) (10/05 0556) Temp Source: Oral (10/05 0556) BP: 121/49 (10/05 0556) Pulse Rate: 74 (10/05 0556)  Labs:  Recent Labs  11/23/16 1805  11/25/16 0658 11/25/16 1830 11/26/16 0517  HGB 11.0*  --  10.1*  --  10.3*  HCT 34.7*  --  30.3*  --  32.3*  PLT 259  --  221  --  280  HEPARINUNFRC  --   < > 0.25* 0.45 0.44  CREATININE 0.96  --  0.85  --   --   < > = values in this interval not displayed.  Estimated Creatinine Clearance: 50.4 mL/min (by C-G formula based on SCr of 0.85 mg/dL).   Medications:   Scheduled:  . atorvastatin  40 mg Oral q1800  . B-complex with vitamin C  1 tablet Oral Daily  . brimonidine  1 drop Both Eyes QHS   And  . timolol  1 drop Both Eyes QHS  . cholecalciferol  2,000 Units Oral Daily  . latanoprost  1 drop Both Eyes Daily  . multivitamin with minerals  1 tablet Oral Daily  . polyethylene glycol  17 g Oral Daily   Infusions:  . famotidine (PEPCID) IV Stopped (11/25/16 2333)  . heparin 1,100 Units/hr (11/26/16 0340)    Assessment: 76 yo F with colon cancer s/p hemicolectomy on 9/18, developed post-op ileus, discharged 10/1.  Presented to ED with acute left leg swelling and dx with large DVT.   Heparin level remains therapeutic at 0.44 on 1100 units/hr. No issues noted per nursing. No signs/symptoms of bleeding noted.   Goal of Therapy:  Heparin level 0.3-0.7 units/ml Monitor platelets by anticoagulation protocol: Yes   Plan:  Continue heparin at 1100 units/hr Heparin level and CBC daily while on heparin Monitor for s/s of bleeding  Follow-up plans for procedure and DOAC    Thank you for allowing [place] to participate in this patients care.  Jens Som, PharmD Clinical phone for 11/26/2016 from 7a-3:30p: [phone] If after 3:30p, please call main pharmacy at: [phone] 11/26/2016 7:29 AM

## 2016-11-26 NOTE — Progress Notes (Addendum)
Pharmacy Antibiotic Note  [name] is a 76 y.o. female admitted on 11/23/2016 with UTI.  Pharmacy has been consulted for ceftriaxone dosing. UA positive and patient reporting dysuria. Has listed allergies to ampicillin (nausea) and penicillins. Talked with patient who endorsed intolerance of nausea with penicillin class; confirmed no SOB or swelling.   Plan: Ceftriaxone 1 gram every 24 hours  Monitor cx results, clinical picture, and length of therapy  Height: 5\' 3"  (160 cm) Weight: 149 lb 0.5 oz (67.6 kg) IBW/kg (Calculated) : 52.4  Temp (24hrs), Avg:99.3 F (37.4 C), Min:99 F (37.2 C), Max:99.5 F (37.5 C)   Recent Labs Lab 11/22/16 0641 11/23/16 1805 11/25/16 0658 11/26/16 0517  WBC 8.3 14.9* 11.3* 8.8  CREATININE 0.75 0.96 0.85  --     Estimated Creatinine Clearance: 50.4 mL/min (by C-G formula based on SCr of 0.85 mg/dL).    Allergies  Allergen Reactions  . Salmon [Fish Allergy] Anaphylaxis    This happens with "CANNED SALMON" only, if eaten uncooked  . Ampicillin Nausea Only  . Codeine Nausea And Vomiting  . Flagyl [Metronidazole] Nausea And Vomiting    Patient was taking this along with Neomycin and could not differentiate which med triggered the nausea & vomiting  . Meperidine Hcl Nausea And Vomiting  . Neomycin Nausea And Vomiting    Patient was taking this along with Flagyl and could not differentiate which med triggered the nausea & vomiting  . Penicillins Other (See Comments)    Has patient had a PCN reaction causing immediate rash, facial/tongue/throat swelling, SOB or lightheadedness with hypotension: Unknown Has patient had a PCN reaction causing severe rash involving mucus membranes or skin necrosis: No Has patient had a PCN reaction that required hospitalization: No Has patient had a PCN reaction occurring within the last 10 years: No If all of the above answers are "NO", then may proceed with Cephalosporin use.   Marland Kitchen Propoxyphene Hcl Nausea  And Vomiting  . Sulfa Antibiotics Hives  . Tramadol Hcl Nausea And Vomiting    Antimicrobials this admission: Ceftriaxone 10/5 >>   Dose adjustments this admission: N/A  Microbiology results: 10/5 UCx: sent   Thank you for allowing pharmacy to be a part of this patient's care.  Doylene Canard, PharmD Clinical Pharmacist  Phone: [phone] 11/26/2016 5:43 PM

## 2016-11-26 NOTE — Progress Notes (Signed)
PROGRESS NOTE [facility]   [place]   LTJ:[id number] DOB: February 02, 1941  DOA: 11/23/2016 PCP: Abner Greenspan, MD   Brief Narrative:  [name] a 76 year old female with a medical history of hemicolectomy for colon cancer on 9/18 with postop ileus discharged on 10/1, HSV and hyperlipidemia. She presented to the ED on 10/2 with acute left leg swelling and "tightness" and was diagnosed with a large DVT involving external iliac, common femoral, femoral, profunda femoral, popliteal, posterior tibial, peroneal, and intramuscular gastrocnemius veins. Ilio-caval [place] 10/4 revealed thrombus continues to left distal/mid iliac, but not into the IVC or right iliac artery. She was started on heparin and VVS consulted.  Subjective: Patient reporting improving nausea compared to yesterday, but still hasn't had a bowel movement. She has had some flatus. Continued pain and tightness in left leg. Reports dysuria yesterday. Denies fevers or sore throat. Denies chest pain, shortness of breath, or vomiting.  Assessment & Plan: Principal Problem:   Acute deep vein thrombosis (DVT) of proximal vein of left lower extremity (HCC) Active Problems:   Cancer of overlapping sites of colon ([place])  Acute DVT of left lower extremity -Recent GI operation and post op complication leading to prolonged hospitalization. No family history or personal history of clotting disorders.  -DVT involving involving external iliac, common femoral, femoral, profunda femoral, popliteal, posterior tibial, peroneal, and intramuscular gastrocnemius veins -CTA Chest 10/2: no evidence of PE -Started on heparin. VVS consulted for further management.   Colon cancer s/p hemicolectomy -Surgery on 0/76 complicated with post-op ileus. Discharged 10/1.  -Has not had a bowel movement since she's been here- continue dulcolax, miralax.  -Ordered abdominal xray. Consulted with general surgery.  -Improving nausea-  continue pepcid, zofran and promethazine as scheduled.  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 She wants to hold off on treatment until she can get her home medication.  Dysuria -Ordered UA  Hyperlipidemia -Continue statin therapy   DVT prophylaxis: Heparin Code Status: Full Family Communication: None at bedside Disposition Plan: Once medically stable  Consultants:   VVS  Procedures:   Ilio-caval [place] 10/4: Occlusive DVT of the left mid to distal iliac vein. IVC and right iliac appear patent  Antimicrobials:  None  Objective: Vitals:   11/24/16 2125 11/25/16 0511 11/25/16 2104 11/26/16 0556  BP: (!) 121/59 (!) 116/55 (!) 138/54 (!) 121/49  Pulse: 96 80 86 74  Resp: 18 18 16 17   Temp: 99 F (37.2 C) 99.3 F (37.4 C) 99.4 F (37.4 C) 99 F (37.2 C)  TempSrc: Oral Oral Oral Oral  SpO2: 97% 97% 98% 97%  Weight:      Height:       No intake or output data in the 24 hours ending 11/26/16 1011 Filed Weights   11/23/16 1752 11/24/16 0351  Weight: 67.6 kg (149 lb) 67.6 kg (149 lb 0.5 oz)    Examination: General exam: Appears calm and comfortable  Respiratory system: Clear to auscultation. No wheezes,crackle or rhonchi. No increased work of breathing. Cardiovascular system: S1 & S2 heard, RRR. No JVD, murmurs, rubs or gallops Gastrointestinal system: Abdomen is nondistended and soft. Mild soreness due to recent operation. Normal bowel sounds heard. Central nervous system: Alert and oriented.  Extremities: Mild left leg swelling. 2+ peripheral pulses bilaterally.  Data Reviewed: I have personally reviewed following labs and imaging studies  CBC:  Recent  Labs Lab 11/22/16 0641 11/23/16 1805 11/25/16 0658 11/26/16 0517  WBC 8.3 14.9* 11.3* 8.8  NEUTROABS 5.3  --   --   --   HGB 9.5* 11.0* 10.1* 10.3*  HCT 30.2* 34.7* 30.3* 32.3*  MCV 86.0 85.5  84.9 85.0  PLT 231 259 221 884   Basic Metabolic Panel:  Recent Labs Lab 11/22/16 0641 11/23/16 1805 11/25/16 0658  NA 136 129* 132*  K 4.2 3.9 3.9  CL 105 98* 98*  CO2 27 24 26   GLUCOSE 122* 152* 116*  BUN 18 19 15   CREATININE 0.75 0.96 0.85  CALCIUM 8.6* 9.1 8.6*  MG 2.0  --   --   PHOS 3.3  --   --    GFR: Estimated Creatinine Clearance: 50.4 mL/min (by C-G formula based on SCr of 0.85 mg/dL). Liver Function Tests:  Recent Labs Lab 11/22/16 0641  AST 16  ALT 22  ALKPHOS 74  BILITOT 0.5  PROT 5.5*  ALBUMIN 2.6*   Scheduled Meds: . atorvastatin  40 mg Oral q1800  . B-complex with vitamin C  1 tablet Oral Daily  . brimonidine  1 drop Both Eyes QHS   And  . timolol  1 drop Both Eyes QHS  . cholecalciferol  2,000 Units Oral Daily  . latanoprost  1 drop Both Eyes Daily  . multivitamin with minerals  1 tablet Oral Daily  . polyethylene glycol  17 g Oral Daily   Continuous Infusions: . famotidine (PEPCID) IV Stopped (11/26/16 1016)  . heparin 1,100 Units/hr (11/26/16 0340)     LOS: 2 days   Time spent: Total of 25 minutes spent with pt, greater than 50% of which was spent in discussion of  treatment, counseling and coordination of care   Minda Ditto, PA-S   Pager: Text Page via [email or web address]  If 7PM-7AM, please contact night-coverage [email or web address] 11/26/2016, 10:11 AM

## 2016-11-26 NOTE — Progress Notes (Addendum)
Subjective  -   Still nauseated Concerned about amber color of urine   Physical Exam:  No significant left leg swelling today   Assessment/Plan:    I discussed her case with [name] today who stated that if needed, we could do catheter directed thrombolysis.  After discussing her situation (in the presence of her nurse, Aldona Bar) the patient stated that she does not really want to have a procedure done.  I agree with this plan.  She really does not have significant swelling, and I think the risks of thrombolysis are not worth it.  Because of her history of stroke and recent surgery, I believe she has a higher than normal risk of bleeding, either intracranial or intra-abdominal.   I would recommend conversion of heparin to an oral agent.  I gave her information about obtaining 20-30 thigh high compression stockings at discharge.  She has multiple other medical issue that are more important currently, including her nutritional status and dehydration.  Because of the color of her urine and dehydration, with thrombolysis, she would also be at risk for renal dysfunction.  Dr. Donnetta Hutching will re-evaluate the patient on Sunday.  , [place] 11/26/2016 6:22 PM --  Vitals:   11/26/16 0556 11/26/16 1603  BP: (!) 121/49 (!) 118/58  Pulse: 74 83  Resp: 17 18  Temp: 99 F (37.2 C) 99.5 F (37.5 C)  SpO2: 97% 97%    Intake/Output Summary (Last 24 hours) at 11/26/16 1822 Last data filed at 11/26/16 1306  Gross per 24 hour  Intake                0 ml  Output              900 ml  Net             -900 ml     Laboratory CBC    Component Value Date/Time   WBC 8.8 11/26/2016 0517   HGB 10.3 (L) 11/26/2016 0517   HGB 12.2 09/23/2016 1256   HCT 32.3 (L) 11/26/2016 0517   HCT 38.3 09/23/2016 1256   PLT 280 11/26/2016 0517   PLT 310 09/23/2016 1256    BMET    Component Value Date/Time   NA 132 (L) 11/25/2016 0658   NA 141 09/23/2016 1256   K 3.9 11/25/2016 0658   K 4.1  09/23/2016 1256   CL 98 (L) 11/25/2016 0658   CO2 26 11/25/2016 0658   CO2 27 09/23/2016 1256   GLUCOSE 116 (H) 11/25/2016 0658   GLUCOSE 94 09/23/2016 1256   BUN 15 11/25/2016 0658   BUN 15.2 09/23/2016 1256   CREATININE 0.85 11/25/2016 0658   CREATININE 0.8 09/23/2016 1256   CALCIUM 8.6 (L) 11/25/2016 0658   CALCIUM 9.3 09/23/2016 1256   GFRNONAA >60 11/25/2016 0658   GFRAA >60 11/25/2016 0658    COAG Lab Results  Component Value Date   INR 0.95 11/08/2016   INR 0.96 05/06/2015   INR 1.03 09/13/2010   No results found for: PTT  Antibiotics Anti-infectives    Start     Dose/Rate Route Frequency Ordered Stop   11/26/16 1800  cefTRIAXone (ROCEPHIN) 1 g in dextrose 5 % 50 mL IVPB     1 g 100 mL/hr over 30 Minutes Intravenous Every 24 hours 11/26/16 1757     11/24/16 2200  valACYclovir (VALTREX) tablet 500 mg  Status:  Discontinued     50 0 mg Oral 2 times daily 11/24/16 1553  11/25/16 1510       V. Leia Alf, M.D. [facility] Office: [phone] Pager:  [phone]

## 2016-11-27 LAB — CBC
HCT: 29.3 % — ABNORMAL LOW (ref 36.0–46.0)
Hemoglobin: 9.3 g/dL — ABNORMAL LOW (ref 12.0–15.0)
MCH: 26.9 pg (ref 26.0–34.0)
MCHC: 31.7 g/dL (ref 30.0–36.0)
MCV: 84.7 fL (ref 78.0–100.0)
Platelets: 270 10*3/uL (ref 150–400)
RBC: 3.46 MIL/uL — ABNORMAL LOW (ref 3.87–5.11)
RDW: 14.3 % (ref 11.5–15.5)
WBC: 6.5 10*3/uL (ref 4.0–10.5)

## 2016-11-27 LAB — BASIC METABOLIC PANEL
Anion gap: 8 (ref 5–15)
BUN: 11 mg/dL (ref 6–20)
CO2: 27 mmol/L (ref 22–32)
Calcium: 8.3 mg/dL — ABNORMAL LOW (ref 8.9–10.3)
Chloride: 96 mmol/L — ABNORMAL LOW (ref 101–111)
Creatinine, Ser: 0.94 mg/dL (ref 0.44–1.00)
GFR calc Af Amer: >60 mL/min (ref >60)
GFR calc non Af Amer: 57 mL/min — ABNORMAL LOW (ref >60)
Glucose, Bld: 115 mg/dL — ABNORMAL HIGH (ref 65–99)
Potassium: 3.5 mmol/L (ref 3.5–5.1)
Sodium: 131 mmol/L — ABNORMAL LOW (ref 135–145)

## 2016-11-27 LAB — HEPARIN LEVEL (UNFRACTIONATED): Heparin Unfractionated: 0.47 IU/mL (ref 0.30–0.70)

## 2016-11-27 MED ORDER — APIXABAN 5 MG PO TABS
10.0000 mg | ORAL_TABLET | Freq: Two times a day (BID) | ORAL | Status: DC
  Administered 2016-11-27 – 2016-11-28 (×3): 10 mg via ORAL
  Filled 2016-11-27 (×5): qty 2

## 2016-11-27 MED ORDER — APIXABAN 5 MG PO TABS: 5.0000 mg | ORAL_TABLET | Freq: Two times a day (BID) | ORAL | Status: DC

## 2016-11-27 MED ORDER — FAMOTIDINE 20 MG PO TABS
20.0000 mg | ORAL_TABLET | Freq: Two times a day (BID) | ORAL | Status: DC
  Administered 2016-11-27 – 2016-11-30 (×7): 20 mg via ORAL
  Filled 2016-11-27 (×7): qty 1

## 2016-11-27 MED ORDER — BISACODYL 10 MG RE SUPP
10.0000 mg | Freq: Once | RECTAL | Status: AC
  Administered 2016-11-27: 10 mg via RECTAL
  Filled 2016-11-27: qty 1

## 2016-11-27 MED ORDER — PHENAZOPYRIDINE HCL 100 MG PO TABS
100.0000 mg | ORAL_TABLET | Freq: Three times a day (TID) | ORAL | Status: DC
  Administered 2016-11-27 – 2016-11-28 (×3): 100 mg via ORAL
  Filled 2016-11-27 (×3): qty 1

## 2016-11-27 NOTE — Progress Notes (Signed)
PT Cancellation Note  Patient Details Name: Aiden Rao MRN: [id number] DOB: September 20, 1940   Cancelled Treatment:    Reason Eval/Treat Not Completed:  (pt declined, shoes not here).  Reports she needs her shoes to walk.     Shanna Cisco 11/27/2016, 3:22 PM

## 2016-11-27 NOTE — Progress Notes (Addendum)
[place] for Heparin/Apixaban Indication: DVT  Allergies  Allergen Reactions  . Salmon [Fish Allergy] Anaphylaxis    This happens with "CANNED SALMON" only, if eaten uncooked  . Ampicillin Nausea Only  . Codeine Nausea And Vomiting  . Flagyl [Metronidazole] Nausea And Vomiting    Patient was taking this along with Neomycin and could not differentiate which med triggered the nausea & vomiting  . Meperidine Hcl Nausea And Vomiting  . Neomycin Nausea And Vomiting    Patient was taking this along with Flagyl and could not differentiate which med triggered the nausea & vomiting  . Penicillins Other (See Comments)    Has patient had a PCN reaction causing immediate rash, facial/tongue/throat swelling, SOB or lightheadedness with hypotension: Unknown Has patient had a PCN reaction causing severe rash involving mucus membranes or skin necrosis: No Has patient had a PCN reaction that required hospitalization: No Has patient had a PCN reaction occurring within the last 10 years: No If all of the above answers are "NO", then may proceed with Cephalosporin use.   Marland Kitchen Propoxyphene Hcl Nausea And Vomiting  . Sulfa Antibiotics Hives  . Tramadol Hcl Nausea And Vomiting    Patient Measurements: Height: 5\' 3"  (160 cm) Weight: 149 lb 0.5 oz (67.6 kg) IBW/kg (Calculated) : 52.4 Heparin Dosing Weight: 67.6 kg  Vital Signs: Temp: 99.8 F (37.7 C) (10/06 0714) Temp Source: Oral (10/06 0714) BP: 116/57 (10/06 0714) Pulse Rate: 72 (10/06 0714)  Labs:  Recent Labs  11/25/16 0658 11/25/16 1830 11/26/16 0517 11/27/16 0339  HGB 10.1*  --  10.3* 9.3*  HCT 30.3*  --  32.3* 29.3*  PLT 221  --  280 270  HEPARINUNFRC 0.25* 0.45 0.44 0.47  CREATININE 0.85  --   --  0.94    Estimated Creatinine Clearance: 45.6 mL/min (by C-G formula based on SCr of 0.94 mg/dL).   Medications:  Scheduled:  . atorvastatin  40 mg Oral q1800  . B-complex with vitamin C  1  tablet Oral Daily  . brimonidine  1 drop Both Eyes QHS   And  . timolol  1 drop Both Eyes QHS  . cholecalciferol  2,000 Units Oral Daily  . latanoprost  1 drop Both Eyes Daily  . multivitamin with minerals  1 tablet Oral Daily  . polyethylene glycol  17 g Oral Daily   Infusions:  . cefTRIAXone (ROCEPHIN)  IV Stopped (11/26/16 1830)  . famotidine (PEPCID) IV Stopped (11/26/16 2204)  . heparin 1,100 Units/hr (11/27/16 0300)    Assessment: 76 yo F with colon cancer s/p hemicolectomy on 9/18, developed post-op ileus, discharged 10/1.  Presented to ED with acute left leg swelling and dx with large DVT. Per VVS, no plans for catheter directed thrombolysis at this time. Slight decrease in Hgb, plts wnl. No signs/symptoms of bleeding noted.   Per conversation with Dr. Quincy Simmonds this AM, heparin to be transitioned to Apixaban for DVT treatment. Pharmacy consulted for this dosing.    Goal of Therapy:  Clot resolution Prevention of new DVT/PE   Plan:  Discontinue heparin at 1100 units/hr Begin Apixaban 10 mg BID x 7d, then 5 mg BID thereafter Follow-up in the outpatient for LOT Monitor for s/s of bleeding  Leroy Libman, PharmD Pharmacy Resident Pager: [phone]

## 2016-11-27 NOTE — Progress Notes (Signed)
PROGRESS NOTE [facility]   [place]   KXF:[id number] DOB: 1940/07/23  DOA: 11/23/2016 PCP: Abner Greenspan, MD   Brief Narrative:  [name] is a 76 year old female with medical history of hemicolectomy for colon cancer on 6/96, complicated by ileus post op. She presented to the emergency department on 10/2 with acute leftleg swelling and tenderness. Upon ED evaluation she was found to have a large DVT involving external iliac, common femoral, profunda femoral and popliteal veins. A couple ultrasound on 10/4 revealed thrombus continue to left distal/mid iliac vein. Patient was started on heparin drip and vascular surgery was consulted. During hospital stay patient has complained of nausea and vomiting abdominal x-ray was done, which show ileus. Patient also complaining of dysuria for which UA was found to be consistent with UTI patient started on ceftriaxone. Regarding DVT vascular surgeon recommended against thrombectomy as this procedure required TPA and patient is at high risk for internal bleeding.  Subjective: Patient seen and examined, had a good breakfast today. Feeling better, c/o dysuria. Abdominal pain improved but still no BM.   Assessment & Plan: Acute DVT left lower extremity  Extensive DVT, vascular surgery discussed with patient and decided not to proceed with thrombectomy at this point. Patient at high risk for bleeding given her history of prior stroke and recent surgery. Discussed with patient different type of oral anticoagulation patient decides for Eliquis. Will transition from heparin drip to Eliquis per pharmacy recommendations. Will continue to follow VSS recommendations  Colon cancer s/p hemicolectomy ~ 2 weeks ago Stable   Constipation with nausea - ileus - Improve but no BM yet  Abd xray shows ileus but no obstruction  Encourage ambulation  Ducolax suppository  Continue Miralax  OOB as tolerated   UTI  Stared on Rocephin    Likely causing discoloration in urine and dysuria  Added pyridium for pain  Follow urine cultures   ? 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 Related to heparin  No overt bleeding  Will continue to monitor   DVT prophylaxis: Eliquis  Code Status: Full code  Family Communication: None at bedside  Disposition Plan: Home in next 1-2 days   Consultants:   VVS  Procedures:   None   Antimicrobials: Anti-infectives    Start     Dose/Rate Route Frequency Ordered Stop   11/26/16 1800  cefTRIAXone (ROCEPHIN) 1 g in dextrose 5 % 50 mL IVPB     1 g 100 mL/hr over 30 Minutes Intravenous Every 24 hours 11/26/16 1757     11/24/16 2200  valACYclovir (VALTREX) tablet 500 mg  Status:  Discontinued     500 mg Oral 2 times daily 11/24/16 1553 11/25/16 1510        Objective: Vitals:   11/26/16 1603 11/26/16 2114 11/27/16 0714 11/27/16 1507  BP: (!) 118/58 133/70 (!) 116/57 (!) 115/50  Pulse: 83 79 72 78  Resp: 18 20 20 18   Temp: 99.5 F (37.5 C) 99.2 F (37.3 C) 99.8 F (37.7 C) 98.8 F (37.1 C)  TempSrc: Oral Oral Oral Oral  SpO2: 97% 96% 96% 98%  Weight:      Height:        Intake/Output Summary (Last 24 hours) at 11/27/16 1530 Last data filed at 11/27/16 1000  Gross per 24 hour  Intake              290 ml  Output             1100 ml  Net             -810 ml   Filed Weights   11/23/16 1752 11/24/16 0351  Weight: 67.6 kg (149 lb) 67.6 kg (149 lb 0.5 oz)    Examination: General: Pt is alert, awake, not in acute distress Cardiovascular: RRR, S1/S2 +, no rubs, no gallops Respiratory: CTA bilaterally, no wheezing, no rhonchi Abdominal: Soft, NT, ND, bowel sounds + Extremities: Left leg swelling - somewhat improved  Skin: Small lesion/blister on left buttock   Data  Reviewed: I have personally reviewed following labs and imaging studies  CBC:  Recent Labs Lab 11/22/16 0641 11/23/16 1805 11/25/16 0658 11/26/16 0517 11/27/16 0339  WBC 8.3 14.9* 11.3* 8.8 6.5  NEUTROABS 5.3  --   --   --   --   HGB 9.5* 11.0* 10.1* 10.3* 9.3*  HCT 30.2* 34.7* 30.3* 32.3* 29.3*  MCV 86.0 85.5 84.9 85.0 84.7  PLT 231 259 221 280 761   Basic Metabolic Panel:  Recent Labs Lab 11/22/16 0641 11/23/16 1805 11/25/16 0658 11/27/16 0339  NA 136 129* 132* 131*  K 4.2 3.9 3.9 3.5  CL 105 98* 98* 96*  CO2 27 24 26 27   GLUCOSE 122* 152* 116* 115*  BUN 18 19 15 11   CREATININE 0.75 0.96 0.85 0.94  CALCIUM 8.6* 9.1 8.6* 8.3*  MG 2.0  --   --   --   PHOS 3.3  --   --   --    GFR: Estimated Creatinine Clearance: 45.6 mL/min (by C-G formula based on SCr of 0.94 mg/dL). Liver Function Tests:  Recent Labs Lab 11/22/16 0641  AST 16  ALT 22  ALKPHOS 74  BILITOT 0.5  PROT 5.5*  ALBUMIN 2.6*   No results for input(s): LIPASE, AMYLASE in the last 168 hours. No results for input(s): AMMONIA in the last 168 hours. Coagulation Profile: No results for input(s): INR, PROTIME in the last 168 hours. Cardiac Enzymes: No results for input(s): CKTOTAL, CKMB, CKMBINDEX, TROPONINI in the last 168 hours. BNP (last 3 results) No results for input(s): PROBNP in the last 8760 hours. HbA1C: No results for input(s): HGBA1C in the last 72 hours. CBG: No results for input(s): GLUCAP in the last 168 hours. Lipid Profile: No results for input(s): CHOL, HDL, LDLCALC, TRIG, CHOLHDL, LDLDIRECT in the last 72 hours. Thyroid Function Tests: No results for input(s): TSH, T4TOTAL, FREET4, T3FREE, THYROIDAB in the last 72 hours. Anemia Panel: No results for input(s): VITAMINB12, FOLATE, FERRITIN, TIBC, IRON, RETICCTPCT in the last 72 hours. Sepsis Labs: No results for input(s): PROCALCITON, LATICACIDVEN in the last 168 hours.  No results found for this or any previous visit (from  the past 240 hour(s)).    Radiology Studies: Dg Abd 2 Views  Result Date: 11/26/2016 CLINICAL DATA:  Abdominal pain. EXAM: ABDOMEN - 2 VIEW COMPARISON:  None. FINDINGS: Mild gaseous distention of the small bowel and colon likely reflecting an ileus. No pneumatosis, pneumoperitoneum or portal venous gas. There is no evidence of free air. No radio-opaque calculi or other significant radiographic abnormality is seen. IMPRESSION: Mild gaseous distention of the small bowel and colon likely reflecting an ileus. Electronically Signed   By: Kathreen Devoid   On: 11/26/2016 13:49    Scheduled Meds: .  apixaban  10 mg Oral BID   Followed by  . [START ON 12/04/2016] apixaban  5 mg Oral BID  . atorvastatin  40 mg Oral q1800  . B-complex with vitamin C  1 tablet Oral Daily  . brimonidine  1 drop Both Eyes QHS   And  . timolol  1 drop Both Eyes QHS  . cholecalciferol  2,000 Units Oral Daily  . famotidine  20 mg Oral BID  . latanoprost  1 drop Both Eyes Daily  . multivitamin with minerals  1 tablet Oral Daily  . phenazopyridine  100 mg Oral TID WC  . polyethylene glycol  17 g Oral Daily   Continuous Infusions: . cefTRIAXone (ROCEPHIN)  IV Stopped (11/26/16 1830)     LOS: 3 days    Time spent: Total of 25 minutes spent with pt, greater than 50% of which was spent in discussion of  treatment, counseling and coordination of care    Chipper Oman, MD Pager: Text Page via [email or web address]   If 7PM-7AM, please contact night-coverage [email or web address] 11/27/2016, 3:30 PM

## 2016-11-28 ENCOUNTER — Inpatient Hospital Stay (HOSPITAL_COMMUNITY): Payer: Medicare HMO

## 2016-11-28 LAB — CBC
HCT: 27.9 % — ABNORMAL LOW (ref 36.0–46.0)
Hemoglobin: 8.8 g/dL — ABNORMAL LOW (ref 12.0–15.0)
MCH: 26.8 pg (ref 26.0–34.0)
MCHC: 31.5 g/dL (ref 30.0–36.0)
MCV: 85.1 fL (ref 78.0–100.0)
Platelets: 298 10*3/uL (ref 150–400)
RBC: 3.28 MIL/uL — ABNORMAL LOW (ref 3.87–5.11)
RDW: 14.3 % (ref 11.5–15.5)
WBC: 6.2 10*3/uL (ref 4.0–10.5)

## 2016-11-28 LAB — RETICULOCYTES
RBC.: 3.47 MIL/uL — ABNORMAL LOW (ref 3.87–5.11)
Retic Count, Absolute: 72.9 10*3/uL (ref 19.0–186.0)
Retic Ct Pct: 2.1 % (ref 0.4–3.1)

## 2016-11-28 LAB — IRON AND TIBC
Iron: 20 ug/dL — ABNORMAL LOW (ref 28–170)
Saturation Ratios: 9 % — ABNORMAL LOW (ref 10.4–31.8)
TIBC: 227 ug/dL — ABNORMAL LOW (ref 250–450)
UIBC: 207 ug/dL

## 2016-11-28 LAB — COMPREHENSIVE METABOLIC PANEL
ALT: 18 U/L (ref 14–54)
AST: 17 U/L (ref 15–41)
Albumin: 2.3 g/dL — ABNORMAL LOW (ref 3.5–5.0)
Alkaline Phosphatase: 72 U/L (ref 38–126)
Anion gap: 10 (ref 5–15)
BUN: 9 mg/dL (ref 6–20)
CO2: 28 mmol/L (ref 22–32)
Calcium: 8.4 mg/dL — ABNORMAL LOW (ref 8.9–10.3)
Chloride: 97 mmol/L — ABNORMAL LOW (ref 101–111)
Creatinine, Ser: 0.97 mg/dL (ref 0.44–1.00)
GFR calc Af Amer: >60 mL/min (ref >60)
GFR calc non Af Amer: 55 mL/min — ABNORMAL LOW (ref >60)
Glucose, Bld: 108 mg/dL — ABNORMAL HIGH (ref 65–99)
Potassium: 3.3 mmol/L — ABNORMAL LOW (ref 3.5–5.1)
Sodium: 135 mmol/L (ref 135–145)
Total Bilirubin: 0.6 mg/dL (ref 0.3–1.2)
Total Protein: 5.9 g/dL — ABNORMAL LOW (ref 6.5–8.1)

## 2016-11-28 LAB — URINALYSIS, ROUTINE W REFLEX MICROSCOPIC
Bilirubin Urine: NEGATIVE
Glucose, UA: NEGATIVE mg/dL
Hgb urine dipstick: NEGATIVE
Ketones, ur: NEGATIVE mg/dL
Leukocytes, UA: NEGATIVE
Nitrite: NEGATIVE
Protein, ur: NEGATIVE mg/dL
Specific Gravity, Urine: 1.005 (ref 1.005–1.030)
pH: 6 (ref 5.0–8.0)

## 2016-11-28 LAB — FOLATE: Folate: 21.2 ng/mL (ref >5.9)

## 2016-11-28 LAB — VITAMIN B12: Vitamin B-12: 292 pg/mL (ref 180–914)

## 2016-11-28 LAB — FERRITIN: Ferritin: 114 ng/mL (ref 11–307)

## 2016-11-28 MED ORDER — POTASSIUM CHLORIDE CRYS ER 20 MEQ PO TBCR
40.0000 meq | EXTENDED_RELEASE_TABLET | ORAL | Status: AC
  Administered 2016-11-28 (×2): 40 meq via ORAL
  Filled 2016-11-28 (×2): qty 2

## 2016-11-28 MED ORDER — SODIUM CHLORIDE 0.9 % IV SOLN
125.0000 mg | Freq: Once | INTRAVENOUS | Status: AC
  Administered 2016-11-28: 125 mg via INTRAVENOUS
  Filled 2016-11-28: qty 10

## 2016-11-28 NOTE — Progress Notes (Signed)
PROGRESS NOTE [facility]   [place]   BDZ:[id number] DOB: 1940-05-06  DOA: 11/23/2016 PCP: Abner Greenspan, MD   Brief Narrative:  [name] is a 76 year old female with medical history of hemicolectomy for colon cancer on 3/41, complicated by ileus post op. She presented to the emergency department on 10/2 with acute leftleg swelling and tenderness. Upon ED evaluation she was found to have a large DVT involving external iliac, common femoral, profunda femoral and popliteal veins. A couple ultrasound on 10/4 revealed thrombus continue to left distal/mid iliac vein. Patient was started on heparin drip and vascular surgery was consulted. During hospital stay patient has complained of nausea and vomiting abdominal x-ray was done, which show ileus. Patient also complaining of dysuria for which UA was found to be consistent with UTI patient started on ceftriaxone. Regarding DVT vascular surgeon recommended against thrombectomy as this procedure required TPA and patient is at high risk for internal bleeding.  Subjective: Continues to improve, had a bowel movement. Urine amber color. UA reveal presence of blood. Denies SOB, chest pain and palpitations.   Assessment & Plan: Acute DVT left lower extremity  Extensive DVT, vascular surgery discussed with patient and decided not to proceed with thrombectomy at this point. Patient at high risk for bleeding given her history of prior stroke and recent surgery. Holding Eliquis tonight give blood in urine. There is evidence of blood loss as CBC continues to drop. Will monitor closely. ? Hx of interstitial cystitis   Colon cancer s/p hemicolectomy ~ 2 weeks ago Stable   Constipation with nausea - ileus - Had BM last night, passing flatus Abd xray shows ileus but no obstruction  Encourage ambulation  Ducolax suppository was given  Continue Miralax  OOB as tolerated   UTI - E Coli  Stared on Rocephin  Now with bloody  urine, unclear if this is related to infectious process or anticoagulation  D/c pyridium as this change urine color, although symptoms started before medication was added  Follow urine cultures pending sensitivities   ? 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 Related to anticoagulation  IV Iron  Check cbc in AM  Transfuse if Hgb < 7  DVT prophylaxis: Eliquis on hold - Encourage ambulation  Code Status: Full code  Family Communication: None at bedside  Disposition Plan: Home in next 1-2 days   Consultants:   VVS  Procedures:   None   Antimicrobials: Anti-infectives    Start     Dose/Rate Route Frequency Ordered Stop   11/26/16 1800  cefTRIAXone (ROCEPHIN) 1 g in dextrose 5 % 50 mL IVPB     1 g 100 mL/hr over 30 Minutes Intravenous Every 24 hours 11/26/16 1757     11/24/16 2200  valACYclovir (VALTREX) tablet 500 mg  Status:  Discontinued     500 mg Oral 2 times daily 11/24/16 1553 11/25/16 1510       Objective: Vitals:   11/27/16 0714 11/27/16 1507 11/27/16 2153 11/28/16 0556  BP: (!) 116/57 (!) 115/50 (!) 105/48 (!) 122/58  Pulse: 72 78 75 73  Resp: 20 18 20 20   Temp: 99.8 F (37.7 C) 98.8 F (37.1 C) 99.5 F (37.5 C) 99.3 F (37.4 C)  TempSrc: Oral Oral Oral Oral  SpO2: 96% 98% 96% 98%  Weight:      Height:  Intake/Output Summary (Last 24 hours) at 11/28/16 1101 Last data filed at 11/28/16 0200  Gross per 24 hour  Intake              290 ml  Output              900 ml  Net             -610 ml   Filed Weights   11/23/16 1752 11/24/16 0351  Weight: 67.6 kg (149 lb) 67.6 kg (149 lb 0.5 oz)    Examination: - No significant changes on exam from 11/27/16 General: NAD  Cardiovascular: RRR, S1/S2 +, no rubs, no gallops Respiratory: CTA bilaterally, no wheezing, no rhonchi Abdominal: Soft, NT, ND, bowel sounds  + Extremities: Left leg swelling - continues to improve  Skin: Small lesion/blister on left buttock   Data Reviewed: I have personally reviewed following labs and imaging studies  CBC:  Recent Labs Lab 11/22/16 0641 11/23/16 1805 11/25/16 0658 11/26/16 0517 11/27/16 0339 11/28/16 0314  WBC 8.3 14.9* 11.3* 8.8 6.5 6.2  NEUTROABS 5.3  --   --   --   --   --   HGB 9.5* 11.0* 10.1* 10.3* 9.3* 8.8*  HCT 30.2* 34.7* 30.3* 32.3* 29.3* 27.9*  MCV 86.0 85.5 84.9 85.0 84.7 85.1  PLT 231 259 221 280 270 960   Basic Metabolic Panel:  Recent Labs Lab 11/22/16 0641 11/23/16 1805 11/25/16 0658 11/27/16 0339 11/28/16 0314  NA 136 129* 132* 131* 135  K 4.2 3.9 3.9 3.5 3.3*  CL 105 98* 98* 96* 97*  CO2 27 24 26 27 28   GLUCOSE 122* 152* 116* 115* 108*  BUN 18 19 15 11 9   CREATININE 0.75 0.96 0.85 0.94 0.97  CALCIUM 8.6* 9.1 8.6* 8.3* 8.4*  MG 2.0  --   --   --   --   PHOS 3.3  --   --   --   --    GFR: Estimated Creatinine Clearance: 44.1 mL/min (by C-G formula based on SCr of 0.97 mg/dL). Liver Function Tests:  Recent Labs Lab 11/22/16 0641 11/28/16 0314  AST 16 17  ALT 22 18  ALKPHOS 74 72  BILITOT 0.5 0.6  PROT 5.5* 5.9*  ALBUMIN 2.6* 2.3*   No results for input(s): LIPASE, AMYLASE in the last 168 hours. No results for input(s): AMMONIA in the last 168 hours. Coagulation Profile: No results for input(s): INR, PROTIME in the last 168 hours. Cardiac Enzymes: No results for input(s): CKTOTAL, CKMB, CKMBINDEX, TROPONINI in the last 168 hours. BNP (last 3 results) No results for input(s): PROBNP in the last 8760 hours. HbA1C: No results for input(s): HGBA1C in the last 72 hours. CBG: No results for input(s): GLUCAP in the last 168 hours. Lipid Profile: No results for input(s): CHOL, HDL, LDLCALC, TRIG, CHOLHDL, LDLDIRECT in the last 72 hours. Thyroid Function Tests: No results for input(s): TSH, T4TOTAL, FREET4, T3FREE, THYROIDAB in the last 72 hours. Anemia  Panel: No results for input(s): VITAMINB12, FOLATE, FERRITIN, TIBC, IRON, RETICCTPCT in the last 72 hours. Sepsis Labs: No results for input(s): PROCALCITON, LATICACIDVEN in the last 168 hours.  Recent Results (from the past 240 hour(s))  Urine Culture     Status: Abnormal (Preliminary result)   Collection Time: 11/26/16  8:24 AM  Result Value Ref Range Status   Specimen Description URINE, RANDOM  Final   Special Requests NONE  Final   Culture >=100,000 COLONIES/mL GRAM NEGATIVE RODS (A)  Final   Report Status PENDING  Incomplete      Radiology Studies: Dg Abd 2 Views  Result Date: 11/28/2016 CLINICAL DATA:  Followup ileus. EXAM: ABDOMEN - 2 VIEW COMPARISON:  11/26/2016 FINDINGS: There is no significant bowel dilation to suggest obstruction. A few air-fluid levels are noted on the erect view. This suggests a mild adynamic ileus, similar to the prior study. There is no free air. Bowel anastomosis staples are noted in the left mid abdomen. There are clips in right upper quadrant from a prior cholecystectomy. No acute skeletal abnormality. IMPRESSION: 1. No evidence of bowel obstruction or free air. 2. Air-filled loops of small bowel and colon with several air-fluid levels on the erect view, similar to the prior exam, consistent with a mild adynamic ileus. Electronically Signed   By: Lajean Manes M.D.   On: 11/28/2016 08:47   Dg Abd 2 Views  Result Date: 11/26/2016 CLINICAL DATA:  Abdominal pain. EXAM: ABDOMEN - 2 VIEW COMPARISON:  None. FINDINGS: Mild gaseous distention of the small bowel and colon likely reflecting an ileus. No pneumatosis, pneumoperitoneum or portal venous gas. There is no evidence of free air. No radio-opaque calculi or other significant radiographic abnormality is seen. IMPRESSION: Mild gaseous distention of the small bowel and colon likely reflecting an ileus. Electronically Signed   By: Kathreen Devoid   On: 11/26/2016 13:49    Scheduled Meds: . atorvastatin  40 mg Oral  q1800  . B-complex with vitamin C  1 tablet Oral Daily  . brimonidine  1 drop Both Eyes QHS   And  . timolol  1 drop Both Eyes QHS  . cholecalciferol  2,000 Units Oral Daily  . famotidine  20 mg Oral BID  . latanoprost  1 drop Both Eyes Daily  . multivitamin with minerals  1 tablet Oral Daily  . polyethylene glycol  17 g Oral Daily   Continuous Infusions: . cefTRIAXone (ROCEPHIN)  IV Stopped (11/27/16 1736)     LOS: 4 days    Time spent: Total of 25 minutes spent with pt, greater than 50% of which was spent in discussion of  treatment, counseling and coordination of care    Chipper Oman, MD Pager: Text Page via [email or web address]   If 7PM-7AM, please contact night-coverage [email or web address] 11/28/2016, 11:01 AM

## 2016-11-28 NOTE — Evaluation (Signed)
Physical Therapy Evaluation and Discharge Patient Details Name: [name] MRN: [id number] DOB: [birth year] Today's Date: 11/28/2016   History of Present Illness    HPI: [name] is a 76 y.o. female with medical history significant of hemicolectomy for colon cancer on 9/18.  On DVT ppx lovenox post op.  Post op course complicated by ileus.  Discharged yesterday.  This morning woke up with acute onset of L lower extremity swelling and pain.  She presents to the ED for concern of DVT.   Clinical Impression  Pt presents at/near baseline for functional mobility, limited by fatigue likely related to anemia.  Would benefit from daily walk with nursing supervision until able to d/c home.  No follow needs anticipated.      Follow Up Recommendations No PT follow up    Equipment Recommendations  None recommended by PT    Recommendations for Other Services       Precautions / Restrictions Precautions Precautions: Fall Precaution Comments: up with supervision Restrictions Weight Bearing Restrictions: No      Mobility  Bed Mobility Overal bed mobility: Independent                Transfers Overall transfer level: Independent Equipment used: None                Ambulation/Gait Ambulation/Gait assistance: Supervision Ambulation Distance (Feet): 150 Feet Assistive device: None (pt uses rail lightly in lieu of cane) Gait Pattern/deviations: Step-through pattern     General Gait Details: steady with some reduction in velocity by observation; fatigues left leg at 1/2 point, able to complete distance otherwise  Stairs            Wheelchair Mobility    Modified Rankin (Stroke Patients Only)       Balance Overall balance assessment: No apparent balance deficits (not formally assessed)                                           Pertinent Vitals/Pain Pain Assessment: No/denies pain    Home Living Family/patient expects  to be discharged to:: Private residence Living Arrangements: Spouse/significant other Available Help at Discharge: Family;Available 24 hours/day Type of Home: House Home Access: Stairs to enter Entrance Stairs-Rails: Can reach both Entrance Stairs-Number of Steps: 5 Home Layout: One level Home Equipment: Cane - single point      Prior Function Level of Independence: Independent with assistive device(s)         Comments: uses cane; husband can supervise only     Hand Dominance        Extremity/Trunk Assessment   Upper Extremity Assessment Upper Extremity Assessment: Overall WFL for tasks assessed    Lower Extremity Assessment Lower Extremity Assessment: Overall WFL for tasks assessed;LLE deficits/detail LLE Deficits / Details: due to DVT, pt reports swollen feeling; left leg fatigues before right    Cervical / Trunk Assessment Cervical / Trunk Assessment: Normal  Communication   Communication: No difficulties  Cognition Arousal/Alertness: Awake/alert Behavior During Therapy: WFL for tasks assessed/performed Overall Cognitive Status: Within Functional Limits for tasks assessed                                        General Comments General comments (skin integrity, edema, etc.): pt with weakness, likely due  to Hgb=8 per her report    Exercises     Assessment/Plan    PT Assessment Patent does not need any further PT services  PT Problem List         PT Treatment Interventions      PT Goals (Current goals can be found in the Care Plan section)  Acute Rehab PT Goals Patient Stated Goal: home PT Goal Formulation: All assessment and education complete, DC therapy    Frequency     Barriers to discharge        Co-evaluation               AM-PAC PT "6 Clicks" Daily Activity  Outcome Measure Difficulty turning over in bed (including adjusting bedclothes, sheets and blankets)?: None Difficulty moving from lying on back to sitting  on the side of the bed? : A Little Difficulty sitting down on and standing up from a chair with arms (e.g., wheelchair, bedside commode, etc,.)?: None Help needed moving to and from a bed to chair (including a wheelchair)?: A Little Help needed walking in hospital room?: A Little Help needed climbing 3-5 steps with a railing? : A Little 6 Click Score: 20    End of Session   Activity Tolerance: Patient tolerated treatment well;Patient limited by fatigue Patient left: in bed;with call bell/phone within reach Nurse Communication: Mobility status (change purwick; ok to walk with nursing daily) PT Visit Diagnosis: Difficulty in walking, not elsewhere classified (R26.2)    Time: 1062-6948 PT Time Calculation (min) (ACUTE ONLY): 23 min   Charges:   PT Evaluation $PT Eval Moderate Complexity: 1 Mod     PT G Codes:        Kearney Hard, PT, DPT, MS Board Certified Geriatric Clinical Specialist  Herbie Drape 11/28/2016, 12:19 PM

## 2016-11-28 NOTE — Progress Notes (Signed)
Subjective: Interval History: none.. Much less swelling in her left leg this morning  Objective: Vital signs in last 24 hours: Temp:  [98.8 F (37.1 C)-99.5 F (37.5 C)] 99.3 F (37.4 C) (10/07 0556) Pulse Rate:  [73-78] 73 (10/07 0556) Resp:  [18-20] 20 (10/07 0556) BP: (105-122)/(48-58) 122/58 (10/07 0556) SpO2:  [96 %-98 %] 98 % (10/07 0556)  Intake/Output from previous day: 10/06 0701 - 10/07 0700 In: 540 [P.O.:480; I.V.:10; IV Piggyback:50] Out: 2000 [Urine:2000] Intake/Output this shift: No intake/output data recorded.  No swelling left leg versus right. Has been up walking  Lab Results:  Recent Labs  11/27/16 0339 11/28/16 0314  WBC 6.5 6.2  HGB 9.3* 8.8*  HCT 29.3* 27.9*  PLT 270 298   BMET  Recent Labs  11/27/16 0339 11/28/16 0314  NA 131* 135  K 3.5 3.3*  CL 96* 97*  CO2 27 28  GLUCOSE 115* 108*  BUN 11 9  CREATININE 0.94 0.97  CALCIUM 8.3* 8.4*    Studies/Results: Ct Angio Chest Pe W And/or Wo Contrast  Result Date: 11/23/2016 CLINICAL DATA:  Nausea and vomiting with swelling in the feet. Recent surgery from September 19th. Right colectomy. EXAM: CT ANGIOGRAPHY CHEST WITH CONTRAST TECHNIQUE: Multidetector CT imaging of the chest was performed using the standard protocol during bolus administration of intravenous contrast. Multiplanar CT image reconstructions and MIPs were obtained to evaluate the vascular anatomy. CONTRAST:  100 mL Isovue 370 COMPARISON:  10/04/2016 PET-CT FINDINGS: Cardiovascular: Satisfactory opacification of the pulmonary arteries to the segmental level. No evidence of pulmonary embolism. Normal heart size. No pericardial effusion. Normal caliber thoracic aorta. No evidence of aortic dissection. Scattered calcifications in the aorta and coronary arteries. Great vessel origins are patent. Mediastinum/Nodes: No enlarged mediastinal, hilar, or axillary lymph nodes. Thyroid gland, trachea, and esophagus demonstrate no significant  findings. Lungs/Pleura: Mild dependent changes in the lung bases. No airspace disease or consolidation in the lungs. Scattered peripheral nodules in the right lung, all measuring less than 4 mm. No significant changes. No pleural effusions. No pneumothorax. Upper Abdomen: Surgical absence of the gallbladder. No bile duct dilatation. Musculoskeletal: Degenerative changes in the spine. Benign-appearing sclerosis in a midthoracic vertebra. Schmorl's nodes. No destructive bone lesions. Review of the MIP images confirms the above findings. IMPRESSION: No evidence of significant pulmonary embolus. No evidence of active pulmonary disease. Aortic Atherosclerosis (ICD10-I70.0). Electronically Signed   By: Lucienne Capers M.D.   On: 11/23/2016 22:14   Dg Abd 2 Views  Result Date: 11/28/2016 CLINICAL DATA:  Followup ileus. EXAM: ABDOMEN - 2 VIEW COMPARISON:  11/26/2016 FINDINGS: There is no significant bowel dilation to suggest obstruction. A few air-fluid levels are noted on the erect view. This suggests a mild adynamic ileus, similar to the prior study. There is no free air. Bowel anastomosis staples are noted in the left mid abdomen. There are clips in right upper quadrant from a prior cholecystectomy. No acute skeletal abnormality. IMPRESSION: 1. No evidence of bowel obstruction or free air. 2. Air-filled loops of small bowel and colon with several air-fluid levels on the erect view, similar to the prior exam, consistent with a mild adynamic ileus. Electronically Signed   By: Lajean Manes M.D.   On: 11/28/2016 08:47   Dg Abd 2 Views  Result Date: 11/26/2016 CLINICAL DATA:  Abdominal pain. EXAM: ABDOMEN - 2 VIEW COMPARISON:  None. FINDINGS: Mild gaseous distention of the small bowel and colon likely reflecting an ileus. No pneumatosis, pneumoperitoneum or portal venous gas.  There is no evidence of free air. No radio-opaque calculi or other significant radiographic abnormality is seen. IMPRESSION: Mild gaseous  distention of the small bowel and colon likely reflecting an ileus. Electronically Signed   By: Kathreen Devoid   On: 11/26/2016 13:49   Dg Abd Portable 1v  Result Date: 11/12/2016 CLINICAL DATA:  NG tube placement. Laparoscopic right colectomy 11/10/2016. EXAM: PORTABLE ABDOMEN - 1 VIEW COMPARISON:  None. FINDINGS: Nasogastric tube is present with tip in the right upper quadrant likely over the distal stomach/proximal duodenum. Surgical clips over the right upper quadrant surgical suture line over the left mid abdomen. There are multiple air-filled loops of large and small bowel with small bowel measuring up to 3.4 cm in diameter likely postoperative ileus. Degenerate change of the spine. IMPRESSION: Postsurgical change with air-filled mildly dilated small bowel loops likely due to postoperative ileus. Nasogastric tube with tip over the right upper quadrant likely over the distal stomach/ proximal duodenum. Electronically Signed   By: Marin Olp M.D.   On: 11/12/2016 16:10   Anti-infectives: Anti-infectives    Start     Dose/Rate Route Frequency Ordered Stop   11/26/16 1800  cefTRIAXone (ROCEPHIN) 1 g in dextrose 5 % 50 mL IVPB     1 g 100 mL/hr over 30 Minutes Intravenous Every 24 hours 11/26/16 1757     11/24/16 2200  valACYclovir (VALTREX) tablet 500 mg  Status:  Discontinued     500 mg Oral 2 times daily 11/24/16 1553 11/25/16 1510      Assessment/Plan: s/p * No surgery found * No role for lysis. Continue anticoagulation. Will not follow actively   LOS: 4 days   [name] 11/28/2016, 10:54 AM

## 2016-11-29 LAB — CBC
HCT: 28.3 % — ABNORMAL LOW (ref 36.0–46.0)
Hemoglobin: 9 g/dL — ABNORMAL LOW (ref 12.0–15.0)
MCH: 27.2 pg (ref 26.0–34.0)
MCHC: 31.8 g/dL (ref 30.0–36.0)
MCV: 85.5 fL (ref 78.0–100.0)
Platelets: 351 10*3/uL (ref 150–400)
RBC: 3.31 MIL/uL — ABNORMAL LOW (ref 3.87–5.11)
RDW: 14.5 % (ref 11.5–15.5)
WBC: 5.6 10*3/uL (ref 4.0–10.5)

## 2016-11-29 LAB — URINE CULTURE: Culture: >=100000 — AB

## 2016-11-29 MED ORDER — LORAZEPAM 1 MG PO TABS
1.0000 mg | ORAL_TABLET | Freq: Once | ORAL | Status: AC
  Administered 2016-11-29: 1 mg via ORAL
  Filled 2016-11-29: qty 1

## 2016-11-29 MED ORDER — APIXABAN 5 MG PO TABS
10.0000 mg | ORAL_TABLET | Freq: Two times a day (BID) | ORAL | Status: DC
  Administered 2016-11-29 – 2016-11-30 (×3): 10 mg via ORAL
  Filled 2016-11-29 (×3): qty 2

## 2016-11-29 MED ORDER — APIXABAN 5 MG PO TABS: 5.0000 mg | ORAL_TABLET | Freq: Two times a day (BID) | ORAL | Status: DC

## 2016-11-29 NOTE — Progress Notes (Signed)
PROGRESS NOTE [facility]   [place]   SHF:[id number] DOB: Sep 28, 1940  DOA: 11/23/2016 PCP: Abner Greenspan, MD   Brief Narrative:  [name] a 76 year old female with a medical history of hemicolectomy for colon cancer on 9/18 with postop ileus discharged on 10/1, HSV and hyperlipidemia. She presented to the ED on 10/2 with acute left leg swelling and "tightness" and was diagnosed with a large DVT involving external iliac, common femoral, femoral, profunda femoral, popliteal, posterior tibial, peroneal, and intramuscular gastrocnemius veins. Ilio-caval [place] 10/4 revealed thrombus continues to left distal/mid iliac, but not into the IVC or right iliac artery. She was started on heparin and VVS consulted. Plan for medical therapy due to high risk for internal bleeding with TPA. Found to have UTI and started on Ceftriaxone, then had hematuria. Holding eliquis since yesterday, consult urology.   Subjective: Steadily improving. Has had soft stool and had a bowel movement yesterday. Denies chest pain, shortness of breath, nausea or vomiting. No other complaints.   Assessment & Plan: Principal Problem:   Acute deep vein thrombosis (DVT) of proximal vein of left lower extremity (HCC) Active Problems:   Cancer of overlapping sites of colon ([place])  Acute DVT of left lower extremity -Recent GI operation and post op complication leading to prolonged hospitalization. -DVT involving involving external iliac, common femoral, femoral, profunda femoral, popliteal, posterior tibial, peroneal, and intramuscular gastrocnemius veins -Started on heparin. VVS consulted and recommended medical therapy due to high risk of internal bleeding with TPA administration with thrombectomy.  -Eliquis on hold since yesterday due to hematuria.    UTI- E. Coli -Started on IV Rocephin. Sensitive to Rocephin, continue.  -Now with bloody urine, unsure if from infection or anticoagulation.    -Consult with urology. 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   Ileus -Resolved. Denies nausea, vomiting, or diarrhea.  -Abdominal xray 10/7: mild adynamic ileus  -Continue pepcid, miralax, and zofran PRN  Colon cancer s/p hemicolectomy -Surgery on 5/02 complicated with post-op ileus. Discharged 10/1.   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 Wants to hold off on treatment until she can get her home medication.  Hyperlipidemia -Continue statin therapy  DVT prophylaxis: Eliquis on hold- encourage ambulation Code Status: Full code Family Communication: None at bedside Disposition Plan: Once medically stable  Consultants:   VVS, urology  Procedures:   Ilio-caval [place] 10/4:Occlusive DVT of the left mid to distal iliac vein. IVC and right iliac appear patent  Antimicrobials: Anti-infectives    Start     Dose/Rate Route Frequency Ordered Stop   11/26/16 1800  cefTRIAXone (ROCEPHIN) 1 g in dextrose 5 % 50 mL IVPB     1 g 100 mL/hr over 30 Minutes Intravenous Every 24 hours 11/26/16 1757     11/24/16 2200  valACYclovir (VALTREX) tablet 500 mg  Status:  Discontinued     500 mg Oral 2 times daily 11/24/16 1553 11/25/16 1510       Objective: Vitals:   11/27/16 2153 11/28/16 0556 11/28/16 2228 11/29/16 0507  BP: (!) 105/48 (!) 122/58 (!) 97/52 (!) 110/49  Pulse: 75 73 (!) 102 75  Resp: 20 20 18 20   Temp: 99.5 F (37.5 C) 99.3 F (37.4 C) 98.9 F (37.2 C) 98.4 F (36.9 C)  TempSrc: Oral Oral Oral Oral  SpO2: 96% 98% 96% 94%  Weight:  Height:        Intake/Output Summary (Last 24 hours) at 11/29/16 1018 Last data filed at 11/28/16 2100  Gross per 24 hour  Intake              720 ml  Output              600 ml  Net              120 ml   Filed Weights   11/23/16 1752 11/24/16 0351  Weight: 67.6 kg (149 lb) 67.6 kg (149 lb 0.5 oz)    Examination: General exam: Appears calm and comfortable  Respiratory system: Clear to auscultation. No wheezes,crackle or rhonchi. No increased work of breathing Cardiovascular system: S1 & S2 heard, RRR. No murmurs, rubs or gallops Gastrointestinal system: Abdomen is nondistended and soft. Mild diffuse soreness due to recent operation. Normal bowel sounds heard. Central nervous system: Alert and oriented.  Extremities: mild left leg swelling. 2+ peripheral pulses bilaterally.     Data Reviewed: I have personally reviewed following labs and imaging studies  CBC:  Recent Labs Lab 11/25/16 0658 11/26/16 0517 11/27/16 0339 11/28/16 0314 11/29/16 0430  WBC 11.3* 8.8 6.5 6.2 5.6  HGB 10.1* 10.3* 9.3* 8.8* 9.0*  HCT 30.3* 32.3* 29.3* 27.9* 28.3*  MCV 84.9 85.0 84.7 85.1 85.5  PLT 221 280 270 298 614   Basic Metabolic Panel:  Recent Labs Lab 11/23/16 1805 11/25/16 0658 11/27/16 0339 11/28/16 0314  NA 129* 132* 131* 135  K 3.9 3.9 3.5 3.3*  CL 98* 98* 96* 97*  CO2 24 26 27 28   GLUCOSE 152* 116* 115* 108*  BUN 19 15 11 9   CREATININE 0.96 0.85 0.94 0.97  CALCIUM 9.1 8.6* 8.3* 8.4*   GFR: Estimated Creatinine Clearance: 44.1 mL/min (by C-G formula based on SCr of 0.97 mg/dL).  Liver Function Tests: Recent Labs Lab 11/28/16 0314  AST 17  ALT 18  ALKPHOS 72  BILITOT 0.6  PROT 5.9*  ALBUMIN 2.3*   Anemia Panel: Recent Labs  11/28/16 1242  VITAMINB12 292  FOLATE 21.2  FERRITIN 114  TIBC 227*  IRON 20*  RETICCTPCT 2.1   Recent Results (from the past 240 hour(s))  Urine Culture     Status: Abnormal (Preliminary result)   Collection Time: 11/26/16  8:24 AM  Result Value Ref Range Status   Specimen Description URINE, RANDOM  Final   Special Requests NONE  Final   Culture (A)  Final    >=100,000 COLONIES/mL ESCHERICHIA COLI CULTURE REINCUBATED FOR BETTER GROWTH    Report Status PENDING  Incomplete   Organism ID, Bacteria ESCHERICHIA COLI (A)  Final       Susceptibility   Escherichia coli - MIC*    AMPICILLIN 4 SENSITIVE Sensitive     CEFAZOLIN <=4 SENSITIVE Sensitive     CEFTRIAXONE <=1 SENSITIVE Sensitive     CIPROFLOXACIN <=0.25 SENSITIVE Sensitive     GENTAMICIN <=1 SENSITIVE Sensitive     IMIPENEM <=0.25 SENSITIVE Sensitive     NITROFURANTOIN <=16 SENSITIVE Sensitive     TRIMETH/SULFA <=20 SENSITIVE Sensitive     AMPICILLIN/SULBACTAM <=2 SENSITIVE Sensitive     PIP/TAZO <=4 SENSITIVE Sensitive     Extended ESBL NEGATIVE Sensitive     * >=100,000 COLONIES/mL ESCHERICHIA COLI     Radiology Studies: Dg Abd 2 Views  Result Date: 11/28/2016 CLINICAL DATA:  Followup ileus. EXAM: ABDOMEN - 2 VIEW COMPARISON:  11/26/2016 FINDINGS: There is no significant bowel  dilation to suggest obstruction. A few air-fluid levels are noted on the erect view. This suggests a mild adynamic ileus, similar to the prior study. There is no free air. Bowel anastomosis staples are noted in the left mid abdomen. There are clips in right upper quadrant from a prior cholecystectomy. No acute skeletal abnormality. IMPRESSION: 1. No evidence of bowel obstruction or free air. 2. Air-filled loops of small bowel and colon with several air-fluid levels on the erect view, similar to the prior exam, consistent with a mild adynamic ileus. Electronically Signed   By: Lajean Manes M.D.   On: 11/28/2016 08:47   Scheduled Meds: . atorvastatin  40 mg Oral q1800  . B-complex with vitamin C  1 tablet Oral Daily  . brimonidine  1 drop Both Eyes QHS   And  . timolol  1 drop Both Eyes QHS  . cholecalciferol  2,000 Units Oral Daily  . famotidine  20 mg Oral BID  . latanoprost  1 drop Both Eyes Daily  . multivitamin with minerals  1 tablet Oral Daily  . polyethylene glycol  17 g Oral Daily   Continuous Infusions: . cefTRIAXone (ROCEPHIN)  IV Stopped (11/28/16 1837)     LOS: 5 days   Time spent: Total of 25 minutes spent with pt, greater than 50% of which was spent in  discussion of  treatment, counseling and coordination of care   Minda Ditto, PA-S   Pager: Text Page via [email or web address]  If 7PM-7AM, please contact night-coverage [email or web address] 11/29/2016, 10:18 AM

## 2016-11-29 NOTE — Progress Notes (Signed)
ANTICOAGULATION CONSULT NOTE  Pharmacy Consult for apixaban Indication: DVT   Assessment: 89 yof with new large LLE DVT. Per VVS, no plans for intervention. Previously transitioned from heparin to apixaban on 10/6, then held 10/7 after 3 doses due to hematuria. Pharmacy consulted to resume apixaban 10/8. CBC stable, SCr wnl stable. No signs/symptoms of bleeding noted.  Goal of Therapy:  VTE treatment/prevention Monitor platelets by anticoagulation protocol: Yes   Plan:  Apixaban 10mg  PO BID to complete 7 day load; then 5mg  PO BID Monitor CBC, s/sx bleeding  Elicia Lamp, PharmD, BCPS Clinical Pharmacist 11/29/2016 2:57 PM

## 2016-11-29 NOTE — Discharge Instructions (Signed)
Information on my medicine - ELIQUIS (apixaban)  This medication education was reviewed with me or my healthcare representative as part of my discharge preparation.  The pharmacist that spoke with me during my hospital stay was:  Romona Curls, [facility]  Why was Eliquis prescribed for you? Eliquis was prescribed to treat blood clots that may have been found in the veins of your legs (deep vein thrombosis) or in your lungs (pulmonary embolism) and to reduce the risk of them occurring again.  What do You need to know about Eliquis ? The starting dose is 10 mg (two 5 mg tablets) taken TWICE daily for the FIRST SEVEN (7) DAYS, then on (enter date)  12/04/16 PM  the dose is reduced to ONE 5 mg tablet taken TWICE daily.  Eliquis may be taken with or without food.   Try to take the dose about the same time in the morning and in the evening. If you have difficulty swallowing the tablet whole please discuss with your pharmacist how to take the medication safely.  Take Eliquis exactly as prescribed and DO NOT stop taking Eliquis without talking to the doctor who prescribed the medication.  Stopping may increase your risk of developing a new blood clot.  Refill your prescription before you run out.  After discharge, you should have regular check-up appointments with your healthcare provider that is prescribing your Eliquis.    What do you do if you miss a dose? If a dose of ELIQUIS is not taken at the scheduled time, take it as soon as possible on the same day and twice-daily administration should be resumed. The dose should not be doubled to make up for a missed dose.  Important Safety Information A possible side effect of Eliquis is bleeding. You should call your healthcare provider right away if you experience any of the following: ? Bleeding from an injury or your nose that does not stop. ? Unusual colored urine (red or dark brown) or unusual colored stools (red or black). ? Unusual bruising  for unknown reasons. ? A serious fall or if you hit your head (even if there is no bleeding).  Some medicines may interact with Eliquis and might increase your risk of bleeding or clotting while on Eliquis. To help avoid this, consult your healthcare provider or pharmacist prior to using any new prescription or non-prescription medications, including herbals, vitamins, non-steroidal anti-inflammatory drugs (NSAIDs) and supplements.  This website has more information on Eliquis (apixaban): http://www.eliquis.com/eliquis/home

## 2016-11-29 NOTE — Care Management Important Message (Signed)
Important Message  Patient Details  Name: [name] MRN: [id number] Date of Birth: [birth year]   Medicare Important Message Given:  Yes    Nathen May 11/29/2016, 8:49 AM

## 2016-11-30 LAB — CBC
HCT: 28.3 % — ABNORMAL LOW (ref 36.0–46.0)
Hemoglobin: 8.7 g/dL — ABNORMAL LOW (ref 12.0–15.0)
MCH: 26.4 pg (ref 26.0–34.0)
MCHC: 30.7 g/dL (ref 30.0–36.0)
MCV: 86 fL (ref 78.0–100.0)
Platelets: 352 10*3/uL (ref 150–400)
RBC: 3.29 MIL/uL — ABNORMAL LOW (ref 3.87–5.11)
RDW: 14.1 % (ref 11.5–15.5)
WBC: 6.4 10*3/uL (ref 4.0–10.5)

## 2016-11-30 MED ORDER — FAMOTIDINE 20 MG PO TABS: 20.0000 mg | ORAL_TABLET | Freq: Two times a day (BID) | ORAL | 0 refills | Status: DC

## 2016-11-30 MED ORDER — ELIQUIS 5 MG VTE STARTER PACK: ORAL_TABLET | ORAL | 0 refills | Status: DC

## 2016-11-30 MED ORDER — CEPHALEXIN 500 MG PO CAPS: 500.0000 mg | ORAL_CAPSULE | Freq: Two times a day (BID) | ORAL | 0 refills | Status: AC

## 2016-11-30 MED ORDER — POLYETHYLENE GLYCOL 3350 17 G PO PACK: 17.0000 g | PACK | Freq: Every day | ORAL | 0 refills | Status: DC

## 2016-11-30 NOTE — Progress Notes (Signed)
[name] to be D/C'd Home per MD order.  Discussed with the patient and all questions fully answered.  VSS, Skin clean, dry and intact without evidence of skin break down, no evidence of skin tears noted. IV catheter discontinued intact. Site without signs and symptoms of complications. Dressing and pressure applied.  An After Visit Summary was printed and given to the patient. Patient received prescription.  D/c education completed with patient/family including follow up instructions, medication list, d/c activities limitations if indicated, with other d/c instructions as indicated by MD - patient able to verbalize understanding, all questions fully answered.   Patient instructed to return to ED, call 911, or call MD for any changes in condition.   Patient escorted via [place], and D/C home via private auto.  Dorris Carnes 11/30/2016 3:52 PM

## 2016-11-30 NOTE — Care Management Note (Addendum)
Case Management Note  Patient Details  Name: [name] MRN: [id number] Date of Birth: Jun 12, 1940  Subjective/Objective:                 Admitted with DVT.  PCP: Loura Pardon  Action/Plan: Plan is to d/c to home today.  Eliquis 30 day free card given to pt and explained per CM.  Expected Discharge Date:    11/30/2016     Expected Discharge Plan:  [place]  In-House Referral:  NA  Discharge planning Services  CM Consult  Post Acute Care Choice:    Choice offered to:  Patient  DME Arranged:  N/A DME Agency:  NA  HH Arranged:  RN [place] Agency:  [facility], referral made pending MD's orders. MD aware of orders needed.  Status of Service:  Completed, signed off  If discussed at [place] of Stay Meetings, dates discussed:    Additional Comments:  12/01/2016 @ 0851 [facility]/ Sarah made CM aware they are unable to accept pt for home health services 2/2 to not being contracted with pt's insurance payor source.  CM then made referral with [place] @ [phone] for [facility] services. CM made pt aware via voice message @ [phone].  Whitman Hero [place], RN 11/30/2016, 12:05 PM

## 2016-11-30 NOTE — Discharge Summary (Signed)
Physician Discharge Summary  [name]  BTD:[id number]  DOB: [birth year]  DOA: 11/23/2016 PCP: Abner Greenspan, MD  Admit date: 11/23/2016 Discharge date: 11/30/2016  Admitted From: Home  Disposition:  Home  Recommendations for Outpatient Follow-up:  1. Follow up with PCP in 1 weeks 2. Please obtain BMP/CBC in one week to monitor Hgb and renal function  3. Follow up with surgery and GI   Home Health: HH aide/PT Equipment/Devices: None   Discharge Condition: Stable  CODE STATUS: Full Code  Diet recommendation: Heart Healthy   Brief/Interim Summary: For full details see H&P/Progress note but in brief, [name] a 76 year old female with medical history of hemicolectomy for colon cancer on 1/06, complicated by ileus post op. She presented to the emergency department on 10/2 with acute left leg swelling and tenderness. Upon ED evaluation she was found to have a large DVT involving external iliac, common femoral, profunda femoral and popliteal veins. A [facility]-c[facility] ultrasound on 10/4 revealed thrombus continue to left distal/mid iliac vein. Patient was started on heparin drip and vascular surgery was consulted. During hospital stay patient has complained of nausea and vomiting abdominal x-ray was done, which show ileus. Patient also complaining of dysuria for which UA was found to be consistent with UTI patient started on ceftriaxone. Regarding DVT vascular surgeon recommended against thrombectomy as this procedure required TPA and patient is at high risk for internal bleeding. Patient was stated on Eliquis. During hospital stay patient developed dark urine and Hbg went down, Eliquis was stopped. Case was discussed with urology, felt to be 2/2 to UTI. Eliquis resumed. Hgb remained stable. Patient having BM, tolerating diet well and urine has cleared out. Patient deemed stable to be discharge. Patient was advised to follow up with PCP within 1 week.   Subjective: Patient  seen and examined, she has no complaints today. No dysuria, urine clear, denies chest pain, SOB and abdominal pain. Patient ambulating with no issues. Having BM's   Discharge Diagnoses/Hospital Course:  Acute DVT left lower extremity  Extensive DVT, vascular surgery discussed with patient and decided not to proceed with thrombectomy at this point. Patient at high risk for bleeding given her history of prior stroke and recent surgery. Patient initially treated with heparin ggt, being discharge on Eliquis.   Colon cancer s/p hemicolectomy~ 3 weeks ago Stable, follow up with surgery as outpatient   Constipation with nausea - ileus - Ileus has resolved  Abd xray shows ileus but no obstruction  Encourage ambulation  Ducolax suppository was given  Continue Miralax   UTI - E Coli  Treated with rocephin - switch to Keflex for 3 more days  Urine is dark amber, no frank blood, unclear if this is related to infectious process or anticoagulation  D/c pyridium as this change urine color - urine now light yellow.   ? 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  Patient was seen by physical therapy, recommending HH PT  On the day of the discharge the patient's vitals were stable, and no other acute medical condition were reported by patient. Patient was felt safe to be discharge to home   Discharge Instructions  You were cared for  by a hospitalist during your hospital stay. If you have any questions about your discharge medications or the care you received while you were in the hospital after you are discharged, you can call the unit and asked to speak with the hospitalist on call if the  hospitalist that took care of you is not available. Once you are discharged, your primary care physician will handle any further medical issues. Please note that NO REFILLS for any discharge medications will be authorized once you are discharged, as it is imperative that you return to your primary care physician (or establish a relationship with a primary care physician if you do not have one) for your aftercare needs so that they can reassess your need for medications and monitor your lab values.  Discharge Instructions    Call MD for:  difficulty breathing, headache or visual disturbances    Complete by:  As directed    Call MD for:  extreme fatigue    Complete by:  As directed    Call MD for:  hives    Complete by:  As directed    Call MD for:  persistant dizziness or light-headedness    Complete by:  As directed    Call MD for:  persistant nausea and vomiting    Complete by:  As directed    Call MD for:  redness, tenderness, or signs of infection (pain, swelling, redness, odor or green/yellow discharge around incision site)    Complete by:  As directed    Call MD for:  severe uncontrolled pain    Complete by:  As directed    Call MD for:  temperature >100.4    Complete by:  As directed    Diet - low sodium heart healthy    Complete by:  As directed    Increase activity slowly    Complete by:  As directed      Allergies as of 11/30/2016      Reactions   Salmon [fish Allergy] Anaphylaxis   This happens with "CANNED SALMON" only, if eaten uncooked   Ampicillin Nausea Only   Codeine Nausea And Vomiting   Flagyl [metronidazole] Nausea And Vomiting   Patient was taking this along with Neomycin and could not differentiate which med triggered the nausea & vomiting   Meperidine Hcl Nausea And Vomiting   Neomycin Nausea And Vomiting   Patient was taking this along with Flagyl and could not differentiate which med triggered the nausea & vomiting   Penicillins Other (See Comments)   Has  patient had a PCN reaction causing immediate rash, facial/tongue/throat swelling, SOB or lightheadedness with hypotension: Unknown Has patient had a PCN reaction causing severe rash involving mucus membranes or skin necrosis: No Has patient had a PCN reaction that required hospitalization: No Has patient had a PCN reaction occurring within the last 10 years: No If all of the above answers are "NO", then may proceed with Cephalosporin use.   Propoxyphene Hcl Nausea And Vomiting   Sulfa Antibiotics Hives   Tramadol Hcl Nausea And Vomiting      Medication List    STOP taking these medications   aspirin EC 81 MG tablet     TAKE these medications   atorvastatin 40 MG tablet Commonly known as:  LIPITOR Take 1 tablet (40 mg total) by mouth daily at 6 PM.   b complex vitamins tablet Take 1 tablet by mouth daily.   cephALEXin 500 MG capsule Commonly known as:  KEFLEX Take  1 capsule (500 mg total) by mouth 2 (two) times daily.   cholecalciferol 1000 units tablet Commonly known as:  VITAMIN D Take 2,000 Units by mouth daily.   COMBIGAN 0.2-0.5 % ophthalmic solution Generic drug:  brimonidine-timolol Place 1 drop into both eyes at bedtime.   ELIQUIS STARTER PACK 5 MG Tabs Take as directed on package: start with two-5mg  tablets twice daily for 7 days. On day 8, switch to one-5mg  tablet twice daily.   famotidine 20 MG tablet Commonly known as:  PEPCID Take 1 tablet (20 mg total) by mouth 2 (two) times daily.   gabapentin 100 MG capsule Commonly known as:  NEURONTIN Take 1 capsule (100 mg total) by mouth 2 (two) times daily as needed. What changed:  reasons to take this   LUMIGAN 0.01 % Soln Generic drug:  bimatoprost Place 1 drop into both eyes every morning.   multivitamin with minerals Tabs tablet Take 1 tablet by mouth daily.   polyethylene glycol packet Commonly known as:  MIRALAX / GLYCOLAX Take 17 g by mouth daily.   ROLAIDS PO Take 1-2 tablets by mouth every 8  (eight) hours as needed (for acid reflux).   valACYclovir 500 MG tablet Commonly known as:  VALTREX TAKE 1 TABLET (500 MG TOTAL) BY MOUTH 2 (TWO) TIMES DAILY.      Follow-up Information    [facility] Follow up.   Specialty:  [place] Why:  home health services arranged , office will call and set up home visits Contact information: [address] [place]  [phone]          Allergies  Allergen Reactions  . Salmon [Fish Allergy] Anaphylaxis    This happens with "CANNED SALMON" only, if eaten uncooked  . Ampicillin Nausea Only  . Codeine Nausea And Vomiting  . Flagyl [Metronidazole] Nausea And Vomiting    Patient was taking this along with Neomycin and could not differentiate which med triggered the nausea & vomiting  . Meperidine Hcl Nausea And Vomiting  . Neomycin Nausea And Vomiting    Patient was taking this along with Flagyl and could not differentiate which med triggered the nausea & vomiting  . Penicillins Other (See Comments)    Has patient had a PCN reaction causing immediate rash, facial/tongue/throat swelling, SOB or lightheadedness with hypotension: Unknown Has patient had a PCN reaction causing severe rash involving mucus membranes or skin necrosis: No Has patient had a PCN reaction that required hospitalization: No Has patient had a PCN reaction occurring within the last 10 years: No If all of the above answers are "NO", then may proceed with Cephalosporin use.   Marland Kitchen Propoxyphene Hcl Nausea And Vomiting  . Sulfa Antibiotics Hives  . Tramadol Hcl Nausea And Vomiting    Consultations:  VVS    Procedures/Studies: Ct Angio Chest Pe W And/or Wo Contrast  Result Date: 11/23/2016 CLINICAL DATA:  Nausea and vomiting with swelling in the feet. Recent surgery from September 19th. Right colectomy. EXAM: CT ANGIOGRAPHY CHEST WITH CONTRAST TECHNIQUE: Multidetector CT imaging of the chest was performed using the standard  protocol during bolus administration of intravenous contrast. Multiplanar CT image reconstructions and MIPs were obtained to evaluate the vascular anatomy. CONTRAST:  100 mL Isovue 370 COMPARISON:  10/04/2016 PET-CT FINDINGS: Cardiovascular: Satisfactory opacification of the pulmonary arteries to the segmental level. No evidence of pulmonary embolism. Normal heart size. No pericardial effusion. Normal caliber thoracic aorta. No evidence of aortic dissection. Scattered calcifications in the  aorta and coronary arteries. Great vessel origins are patent. Mediastinum/Nodes: No enlarged mediastinal, hilar, or axillary lymph nodes. Thyroid gland, trachea, and esophagus demonstrate no significant findings. Lungs/Pleura: Mild dependent changes in the lung bases. No airspace disease or consolidation in the lungs. Scattered peripheral nodules in the right lung, all measuring less than 4 mm. No significant changes. No pleural effusions. No pneumothorax. Upper Abdomen: Surgical absence of the gallbladder. No bile duct dilatation. Musculoskeletal: Degenerative changes in the spine. Benign-appearing sclerosis in a midthoracic vertebra. Schmorl's nodes. No destructive bone lesions. Review of the MIP images confirms the above findings. IMPRESSION: No evidence of significant pulmonary embolus. No evidence of active pulmonary disease. Aortic Atherosclerosis (ICD10-I70.0). Electronically Signed   By: Lucienne Capers M.D.   On: 11/23/2016 22:14   Dg Abd 2 Views  Result Date: 11/28/2016 CLINICAL DATA:  Followup ileus. EXAM: ABDOMEN - 2 VIEW COMPARISON:  11/26/2016 FINDINGS: There is no significant bowel dilation to suggest obstruction. A few air-fluid levels are noted on the erect view. This suggests a mild adynamic ileus, similar to the prior study. There is no free air. Bowel anastomosis staples are noted in the left mid abdomen. There are clips in right upper quadrant from a prior cholecystectomy. No acute skeletal abnormality.  IMPRESSION: 1. No evidence of bowel obstruction or free air. 2. Air-filled loops of small bowel and colon with several air-fluid levels on the erect view, similar to the prior exam, consistent with a mild adynamic ileus. Electronically Signed   By: Lajean Manes M.D.   On: 11/28/2016 08:47   Dg Abd 2 Views  Result Date: 11/26/2016 CLINICAL DATA:  Abdominal pain. EXAM: ABDOMEN - 2 VIEW COMPARISON:  None. FINDINGS: Mild gaseous distention of the small bowel and colon likely reflecting an ileus. No pneumatosis, pneumoperitoneum or portal venous gas. There is no evidence of free air. No radio-opaque calculi or other significant radiographic abnormality is seen. IMPRESSION: Mild gaseous distention of the small bowel and colon likely reflecting an ileus. Electronically Signed   By: Kathreen Devoid   On: 11/26/2016 13:49   Dg Abd Portable 1v  Result Date: 11/12/2016 CLINICAL DATA:  NG tube placement. Laparoscopic right colectomy 11/10/2016. EXAM: PORTABLE ABDOMEN - 1 VIEW COMPARISON:  None. FINDINGS: Nasogastric tube is present with tip in the right upper quadrant likely over the distal stomach/proximal duodenum. Surgical clips over the right upper quadrant surgical suture line over the left mid abdomen. There are multiple air-filled loops of large and small bowel with small bowel measuring up to 3.4 cm in diameter likely postoperative ileus. Degenerate change of the spine. IMPRESSION: Postsurgical change with air-filled mildly dilated small bowel loops likely due to postoperative ileus. Nasogastric tube with tip over the right upper quadrant likely over the distal stomach/ proximal duodenum. Electronically Signed   By: Marin Olp M.D.   On: 11/12/2016 16:10     Discharge Exam: Vitals:   11/29/16 2158 11/30/16 0545  BP: (!) 121/52 (!) 125/57  Pulse: 74 70  Resp: 18 18  Temp: 98.8 F (37.1 C) 98.4 F (36.9 C)  SpO2: 97% 98%   Vitals:   11/29/16 0507 11/29/16 1431 11/29/16 2158 11/30/16 0545  BP: (!)  110/49 131/77 (!) 121/52 (!) 125/57  Pulse: 75 76 74 70  Resp: 20 12 18 18   Temp: 98.4 F (36.9 C) 99 F (37.2 C) 98.8 F (37.1 C) 98.4 F (36.9 C)  TempSrc: Oral Oral Oral Oral  SpO2: 94% 99% 97% 98%  Weight:  Height:        General: Pt is alert, awake, not in acute distress Cardiovascular: RRR, S1/S2 +, no rubs, no gallops Respiratory: CTA bilaterally, no wheezing, no rhonchi Abdominal: Soft, NT, ND, bowel sounds + Extremities: Mild left leg swelling, improving.    The results of significant diagnostics from this hospitalization (including imaging, microbiology, ancillary and laboratory) are listed below for reference.     Microbiology: Recent Results (from the past 240 hour(s))  Urine Culture     Status: Abnormal   Collection Time: 11/26/16  8:24 AM  Result Value Ref Range Status   Specimen Description URINE, RANDOM  Final   Special Requests NONE  Final   Culture >=100,000 COLONIES/mL ESCHERICHIA COLI (A)  Final   Report Status 11/29/2016 FINAL  Final   Organism ID, Bacteria ESCHERICHIA COLI (A)  Final      Susceptibility   Escherichia coli - MIC*    AMPICILLIN 4 SENSITIVE Sensitive     CEFAZOLIN <=4 SENSITIVE Sensitive     CEFTRIAXONE <=1 SENSITIVE Sensitive     CIPROFLOXACIN <=0.25 SENSITIVE Sensitive     GENTAMICIN <=1 SENSITIVE Sensitive     IMIPENEM <=0.25 SENSITIVE Sensitive     NITROFURANTOIN <=16 SENSITIVE Sensitive     TRIMETH/SULFA <=20 SENSITIVE Sensitive     AMPICILLIN/SULBACTAM <=2 SENSITIVE Sensitive     PIP/TAZO <=4 SENSITIVE Sensitive     Extended ESBL NEGATIVE Sensitive     * >=100,000 COLONIES/mL ESCHERICHIA COLI     Labs: BNP (last 3 results) No results for input(s): BNP in the last 8760 hours. Basic Metabolic Panel:  Recent Labs Lab 11/23/16 1805 11/25/16 0658 11/27/16 0339 11/28/16 0314  NA 129* 132* 131* 135  K 3.9 3.9 3.5 3.3*  CL 98* 98* 96* 97*  CO2 24 26 27 28   GLUCOSE 152* 116* 115* 108*  BUN 19 15 11 9   CREATININE  0.96 0.85 0.94 0.97  CALCIUM 9.1 8.6* 8.3* 8.4*   Liver Function Tests:  Recent Labs Lab 11/28/16 0314  AST 17  ALT 18  ALKPHOS 72  BILITOT 0.6  PROT 5.9*  ALBUMIN 2.3*   No results for input(s): LIPASE, AMYLASE in the last 168 hours. No results for input(s): AMMONIA in the last 168 hours. CBC:  Recent Labs Lab 11/26/16 0517 11/27/16 0339 11/28/16 0314 11/29/16 0430 11/30/16 0211  WBC 8.8 6.5 6.2 5.6 6.4  HGB 10.3* 9.3* 8.8* 9.0* 8.7*  HCT 32.3* 29.3* 27.9* 28.3* 28.3*  MCV 85.0 84.7 85.1 85.5 86.0  PLT 280 270 298 351 352   Cardiac Enzymes: No results for input(s): CKTOTAL, CKMB, CKMBINDEX, TROPONINI in the last 168 hours. BNP: Invalid input(s): POCBNP CBG: No results for input(s): GLUCAP in the last 168 hours. D-Dimer No results for input(s): DDIMER in the last 72 hours. Hgb A1c No results for input(s): HGBA1C in the last 72 hours. Lipid Profile No results for input(s): CHOL, HDL, LDLCALC, TRIG, CHOLHDL, LDLDIRECT in the last 72 hours. Thyroid function studies No results for input(s): TSH, T4TOTAL, T3FREE, THYROIDAB in the last 72 hours.  Invalid input(s): FREET3 Anemia work up  Recent Labs  11/28/16 1242  VITAMINB12 292  FOLATE 21.2  FERRITIN 114  TIBC 227*  IRON 20*  RETICCTPCT 2.1   Urinalysis    Component Value Date/Time   COLORURINE RED (A) 11/27/2016 1557   APPEARANCEUR CLOUDY (A) 11/27/2016 1557   LABSPEC 1.005 11/27/2016 1557   PHURINE 6.0 11/27/2016 1557   GLUCOSEU NEGATIVE 11/27/2016 1557  HGBUR NEGATIVE 11/27/2016 1557   HGBUR trace-intact 08/01/2009 1421   BILIRUBINUR NEGATIVE 11/27/2016 1557   BILIRUBINUR Neg 08/26/2016 1251   KETONESUR NEGATIVE 11/27/2016 1557   PROTEINUR NEGATIVE 11/27/2016 1557   UROBILINOGEN 0.2 08/26/2016 1251   UROBILINOGEN 0.2 08/01/2009 1421   NITRITE NEGATIVE 11/27/2016 1557   LEUKOCYTESUR NEGATIVE 11/27/2016 1557   Sepsis Labs Invalid input(s): PROCALCITONIN,  WBC,   LACTICIDVEN Microbiology Recent Results (from the past 240 hour(s))  Urine Culture     Status: Abnormal   Collection Time: 11/26/16  8:24 AM  Result Value Ref Range Status   Specimen Description URINE, RANDOM  Final   Special Requests NONE  Final   Culture >=100,000 COLONIES/mL ESCHERICHIA COLI (A)  Final   Report Status 11/29/2016 FINAL  Final   Organism ID, Bacteria ESCHERICHIA COLI (A)  Final      Susceptibility   Escherichia coli - MIC*    AMPICILLIN 4 SENSITIVE Sensitive     CEFAZOLIN <=4 SENSITIVE Sensitive     CEFTRIAXONE <=1 SENSITIVE Sensitive     CIPROFLOXACIN <=0.25 SENSITIVE Sensitive     GENTAMICIN <=1 SENSITIVE Sensitive     IMIPENEM <=0.25 SENSITIVE Sensitive     NITROFURANTOIN <=16 SENSITIVE Sensitive     TRIMETH/SULFA <=20 SENSITIVE Sensitive     AMPICILLIN/SULBACTAM <=2 SENSITIVE Sensitive     PIP/TAZO <=4 SENSITIVE Sensitive     Extended ESBL NEGATIVE Sensitive     * >=100,000 COLONIES/mL ESCHERICHIA COLI    Time coordinating discharge: 35 minutes  SIGNED:  Chipper Oman, MD  [facility] 11/30/2016, 12:51 PM  Pager please text page via  [email or web address] Password [email or web address]

## 2016-12-03 ENCOUNTER — Telehealth: Payer: Self-pay

## 2016-12-03 DIAGNOSIS — D509 Iron deficiency anemia, unspecified: Secondary | ICD-10-CM | POA: Diagnosis not present

## 2016-12-03 DIAGNOSIS — M6281 Muscle weakness (generalized): Secondary | ICD-10-CM | POA: Diagnosis not present

## 2016-12-03 DIAGNOSIS — Z483 Aftercare following surgery for neoplasm: Secondary | ICD-10-CM | POA: Diagnosis not present

## 2016-12-03 DIAGNOSIS — E559 Vitamin D deficiency, unspecified: Secondary | ICD-10-CM | POA: Diagnosis not present

## 2016-12-03 DIAGNOSIS — G4700 Insomnia, unspecified: Secondary | ICD-10-CM | POA: Diagnosis not present

## 2016-12-03 DIAGNOSIS — K589 Irritable bowel syndrome without diarrhea: Secondary | ICD-10-CM | POA: Diagnosis not present

## 2016-12-03 DIAGNOSIS — I10 Essential (primary) hypertension: Secondary | ICD-10-CM | POA: Diagnosis not present

## 2016-12-03 DIAGNOSIS — C188 Malignant neoplasm of overlapping sites of colon: Secondary | ICD-10-CM | POA: Diagnosis not present

## 2016-12-03 DIAGNOSIS — I82422 Acute embolism and thrombosis of left iliac vein: Secondary | ICD-10-CM | POA: Diagnosis not present

## 2016-12-03 DIAGNOSIS — I2510 Atherosclerotic heart disease of native coronary artery without angina pectoris: Secondary | ICD-10-CM | POA: Diagnosis not present

## 2016-12-03 DIAGNOSIS — M810 Age-related osteoporosis without current pathological fracture: Secondary | ICD-10-CM | POA: Diagnosis not present

## 2016-12-03 NOTE — Telephone Encounter (Signed)
Please ok those verbal orders  

## 2016-12-03 NOTE — Telephone Encounter (Signed)
Darlene nurse with [facility] left v/m requesting verbal orders for [facility] nursing 1 x a week for 1 week, 2 x a week 1 week and 1x a week for 2 weeks and PT evaluation.

## 2016-12-03 NOTE — Telephone Encounter (Signed)
Verbal order given to [employer]

## 2016-12-04 DIAGNOSIS — D509 Iron deficiency anemia, unspecified: Secondary | ICD-10-CM | POA: Diagnosis not present

## 2016-12-04 DIAGNOSIS — C188 Malignant neoplasm of overlapping sites of colon: Secondary | ICD-10-CM | POA: Diagnosis not present

## 2016-12-04 DIAGNOSIS — I2510 Atherosclerotic heart disease of native coronary artery without angina pectoris: Secondary | ICD-10-CM | POA: Diagnosis not present

## 2016-12-04 DIAGNOSIS — G4700 Insomnia, unspecified: Secondary | ICD-10-CM | POA: Diagnosis not present

## 2016-12-04 DIAGNOSIS — Z483 Aftercare following surgery for neoplasm: Secondary | ICD-10-CM | POA: Diagnosis not present

## 2016-12-04 DIAGNOSIS — K589 Irritable bowel syndrome without diarrhea: Secondary | ICD-10-CM | POA: Diagnosis not present

## 2016-12-04 DIAGNOSIS — M810 Age-related osteoporosis without current pathological fracture: Secondary | ICD-10-CM | POA: Diagnosis not present

## 2016-12-04 DIAGNOSIS — E559 Vitamin D deficiency, unspecified: Secondary | ICD-10-CM | POA: Diagnosis not present

## 2016-12-04 DIAGNOSIS — I10 Essential (primary) hypertension: Secondary | ICD-10-CM | POA: Diagnosis not present

## 2016-12-04 DIAGNOSIS — I82422 Acute embolism and thrombosis of left iliac vein: Secondary | ICD-10-CM | POA: Diagnosis not present

## 2016-12-05 DIAGNOSIS — K589 Irritable bowel syndrome without diarrhea: Secondary | ICD-10-CM | POA: Diagnosis not present

## 2016-12-05 DIAGNOSIS — G4700 Insomnia, unspecified: Secondary | ICD-10-CM | POA: Diagnosis not present

## 2016-12-05 DIAGNOSIS — I2510 Atherosclerotic heart disease of native coronary artery without angina pectoris: Secondary | ICD-10-CM | POA: Diagnosis not present

## 2016-12-05 DIAGNOSIS — E559 Vitamin D deficiency, unspecified: Secondary | ICD-10-CM | POA: Diagnosis not present

## 2016-12-05 DIAGNOSIS — D509 Iron deficiency anemia, unspecified: Secondary | ICD-10-CM | POA: Diagnosis not present

## 2016-12-05 DIAGNOSIS — C188 Malignant neoplasm of overlapping sites of colon: Secondary | ICD-10-CM | POA: Diagnosis not present

## 2016-12-05 DIAGNOSIS — Z483 Aftercare following surgery for neoplasm: Secondary | ICD-10-CM | POA: Diagnosis not present

## 2016-12-05 DIAGNOSIS — I82422 Acute embolism and thrombosis of left iliac vein: Secondary | ICD-10-CM | POA: Diagnosis not present

## 2016-12-05 DIAGNOSIS — M810 Age-related osteoporosis without current pathological fracture: Secondary | ICD-10-CM | POA: Diagnosis not present

## 2016-12-05 DIAGNOSIS — I10 Essential (primary) hypertension: Secondary | ICD-10-CM | POA: Diagnosis not present

## 2016-12-07 ENCOUNTER — Telehealth: Payer: Self-pay | Admitting: *Deleted

## 2016-12-07 NOTE — Telephone Encounter (Signed)
Please ok that verbal order  

## 2016-12-07 NOTE — Telephone Encounter (Signed)
Lm on [name]'s vm and provided verbal orders

## 2016-12-07 NOTE — Telephone Encounter (Signed)
Nicolette with [facility] left a voicemail requesting a verbal order for physical therapy for 2 times a week for 3 weeks.

## 2016-12-09 DIAGNOSIS — Z483 Aftercare following surgery for neoplasm: Secondary | ICD-10-CM | POA: Diagnosis not present

## 2016-12-09 DIAGNOSIS — C188 Malignant neoplasm of overlapping sites of colon: Secondary | ICD-10-CM | POA: Diagnosis not present

## 2016-12-09 DIAGNOSIS — M810 Age-related osteoporosis without current pathological fracture: Secondary | ICD-10-CM | POA: Diagnosis not present

## 2016-12-09 DIAGNOSIS — K589 Irritable bowel syndrome without diarrhea: Secondary | ICD-10-CM | POA: Diagnosis not present

## 2016-12-09 DIAGNOSIS — I82422 Acute embolism and thrombosis of left iliac vein: Secondary | ICD-10-CM | POA: Diagnosis not present

## 2016-12-09 DIAGNOSIS — I2510 Atherosclerotic heart disease of native coronary artery without angina pectoris: Secondary | ICD-10-CM | POA: Diagnosis not present

## 2016-12-09 DIAGNOSIS — D509 Iron deficiency anemia, unspecified: Secondary | ICD-10-CM | POA: Diagnosis not present

## 2016-12-09 DIAGNOSIS — I10 Essential (primary) hypertension: Secondary | ICD-10-CM | POA: Diagnosis not present

## 2016-12-09 DIAGNOSIS — E559 Vitamin D deficiency, unspecified: Secondary | ICD-10-CM | POA: Diagnosis not present

## 2016-12-09 DIAGNOSIS — G4700 Insomnia, unspecified: Secondary | ICD-10-CM | POA: Diagnosis not present

## 2016-12-13 DIAGNOSIS — I82422 Acute embolism and thrombosis of left iliac vein: Secondary | ICD-10-CM | POA: Diagnosis not present

## 2016-12-13 DIAGNOSIS — I10 Essential (primary) hypertension: Secondary | ICD-10-CM | POA: Diagnosis not present

## 2016-12-13 DIAGNOSIS — C188 Malignant neoplasm of overlapping sites of colon: Secondary | ICD-10-CM | POA: Diagnosis not present

## 2016-12-13 DIAGNOSIS — Z483 Aftercare following surgery for neoplasm: Secondary | ICD-10-CM | POA: Diagnosis not present

## 2016-12-15 ENCOUNTER — Encounter: Payer: Self-pay | Admitting: Family Medicine

## 2016-12-15 ENCOUNTER — Telehealth: Payer: Self-pay | Admitting: Hematology

## 2016-12-15 ENCOUNTER — Ambulatory Visit (INDEPENDENT_AMBULATORY_CARE_PROVIDER_SITE_OTHER): Payer: Medicare HMO | Admitting: Family Medicine

## 2016-12-15 VITALS — BP 126/68 | HR 89 | Temp 98.4°F | Ht 64.0 in | Wt 147.8 lb

## 2016-12-15 DIAGNOSIS — N3000 Acute cystitis without hematuria: Secondary | ICD-10-CM | POA: Diagnosis not present

## 2016-12-15 DIAGNOSIS — I824Y2 Acute embolism and thrombosis of unspecified deep veins of left proximal lower extremity: Secondary | ICD-10-CM

## 2016-12-15 DIAGNOSIS — D500 Iron deficiency anemia secondary to blood loss (chronic): Secondary | ICD-10-CM

## 2016-12-15 DIAGNOSIS — N390 Urinary tract infection, site not specified: Secondary | ICD-10-CM | POA: Insufficient documentation

## 2016-12-15 DIAGNOSIS — I10 Essential (primary) hypertension: Secondary | ICD-10-CM

## 2016-12-15 DIAGNOSIS — Z87440 Personal history of urinary (tract) infections: Secondary | ICD-10-CM

## 2016-12-15 DIAGNOSIS — C188 Malignant neoplasm of overlapping sites of colon: Secondary | ICD-10-CM

## 2016-12-15 LAB — CBC WITH DIFFERENTIAL/PLATELET
Basophils Absolute: 0 10*3/uL (ref 0.0–0.1)
Basophils Relative: 0.6 % (ref 0.0–3.0)
Eosinophils Absolute: 0.2 10*3/uL (ref 0.0–0.7)
Eosinophils Relative: 2.4 % (ref 0.0–5.0)
HCT: 33 % — ABNORMAL LOW (ref 36.0–46.0)
Hemoglobin: 10.6 g/dL — ABNORMAL LOW (ref 12.0–15.0)
Lymphocytes Relative: 35.3 % (ref 12.0–46.0)
Lymphs Abs: 2.3 10*3/uL (ref 0.7–4.0)
MCHC: 32.1 g/dL (ref 30.0–36.0)
MCV: 87.2 fl (ref 78.0–100.0)
Monocytes Absolute: 0.6 10*3/uL (ref 0.1–1.0)
Monocytes Relative: 9.6 % (ref 3.0–12.0)
Neutro Abs: 3.5 10*3/uL (ref 1.4–7.7)
Neutrophils Relative %: 52.1 % (ref 43.0–77.0)
Platelets: 369 10*3/uL (ref 150.0–400.0)
RBC: 3.79 Mil/uL — ABNORMAL LOW (ref 3.87–5.11)
RDW: 17.2 % — ABNORMAL HIGH (ref 11.5–15.5)
WBC: 6.6 10*3/uL (ref 4.0–10.5)

## 2016-12-15 LAB — BASIC METABOLIC PANEL
BUN: 25 mg/dL — ABNORMAL HIGH (ref 6–23)
CO2: 28 mEq/L (ref 19–32)
Calcium: 9.4 mg/dL (ref 8.4–10.5)
Chloride: 103 mEq/L (ref 96–112)
Creatinine, Ser: 0.89 mg/dL (ref 0.40–1.20)
GFR: 65.13 mL/min (ref >60.00)
Glucose, Bld: 100 mg/dL — ABNORMAL HIGH (ref 70–99)
Potassium: 3.8 mEq/L (ref 3.5–5.1)
Sodium: 138 mEq/L (ref 135–145)

## 2016-12-15 LAB — POC URINALSYSI DIPSTICK (AUTOMATED)
Blood, UA: 25
Spec Grav, UA: 1.025 (ref 1.010–1.025)
pH, UA: 5.5 (ref 5.0–8.0)

## 2016-12-15 MED ORDER — CEPHALEXIN 250 MG PO CAPS: 250.0000 mg | ORAL_CAPSULE | Freq: Two times a day (BID) | ORAL | 0 refills | Status: DC

## 2016-12-15 NOTE — Telephone Encounter (Signed)
Left message for [facility] regarding added appt per 10/23 sch msg.

## 2016-12-15 NOTE — Patient Instructions (Addendum)
Call your surgeon's office to get instructions regarding advancing activity  Keep doing your physical therapy  Drink lots of water  Take the cephalexin for uti and we will call when the urine culture returns  Labs today - we will send in iron if you need it   Follow up with oncology tomorrow as planned

## 2016-12-15 NOTE — Progress Notes (Signed)
Subjective:    Patient ID: [name], female    DOB: [birth year], 76 y.o.   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   Anemia noted -given iv and oral iron   She was d/c after clinical improvement with HH for PT and nursing   She has f/u with oncology tomorrow for colon cancer   Lab Results  Component Value Date   WBC 6.4 11/30/2016   HGB 8.7 (L) 11/30/2016   HCT 28.3 (L) 11/30/2016   MCV 86.0 11/30/2016   PLT 352 11/30/2016     Chemistry      Component Value Date/Time   NA 135 11/28/2016 0314   NA 141 09/23/2016 1256   K 3.3 (L) 11/28/2016 0314   K 4.1 09/23/2016 1256   CL 97 (L) 11/28/2016 0314   CO2 28 11/28/2016 0314   CO2 27 09/23/2016 1256   BUN 9 11/28/2016 0314   BUN 15.2 09/23/2016 1256   CREATININE 0.97 11/28/2016 0314   CREATININE 0.8 09/23/2016 1256      Component Value Date/Time   CALCIUM 8.4 (L) 11/28/2016 0314   CALCIUM 9.3 09/23/2016 1256   ALKPHOS 72 11/28/2016 0314   ALKPHOS 94 09/23/2016 1256   AST 17 11/28/2016 0314   AST 22 09/23/2016 1256   ALT 18 11/28/2016 0314   ALT 20 09/23/2016 1256   BILITOT 0.6 11/28/2016 0314   BILITOT 0.70 09/23/2016 1256      Ct Angio Chest Pe W And/or Wo Contrast  Result Date: 11/23/2016 CLINICAL DATA:  Nausea and vomiting with swelling in the feet. Recent surgery from September 19th. Right colectomy. EXAM: CT ANGIOGRAPHY CHEST WITH CONTRAST TECHNIQUE: Multidetector CT imaging of the chest was performed using the standard protocol during bolus  administration of intravenous contrast. Multiplanar CT image reconstructions and MIPs were obtained to evaluate the vascular anatomy. CONTRAST:  100 mL Isovue 370 COMPARISON:  10/04/2016 PET-CT FINDINGS: Cardiovascular: Satisfactory opacification of the pulmonary arteries to the segmental level. No evidence of pulmonary embolism. Normal heart size. No pericardial effusion. Normal caliber thoracic aorta. No evidence of aortic dissection. Scattered calcifications in the aorta and coronary arteries. Great vessel origins are patent. Mediastinum/Nodes: No enlarged mediastinal, hilar, or axillary lymph nodes. Thyroid gland, trachea, and esophagus demonstrate no significant findings. Lungs/Pleura: Mild dependent changes in the lung bases. No airspace disease or consolidation in the lungs. Scattered peripheral nodules in the right lung, all measuring less than 4 mm. No significant changes. No pleural effusions. No pneumothorax. Upper Abdomen: Surgical absence of the gallbladder. No bile duct dilatation. Musculoskeletal: Degenerative changes in the spine. Benign-appearing sclerosis in a midthoracic vertebra. Schmorl's nodes. No destructive bone lesions. Review of the MIP images confirms the above findings. IMPRESSION: No evidence of significant pulmonary embolus. No evidence of active pulmonary disease. Aortic Atherosclerosis (ICD10-I70.0). Electronically Signed   By: Lucienne Capers M.D.   On: 11/23/2016 22:14   Dg Abd 2 Views  Result  Date: 11/28/2016 CLINICAL DATA:  Followup ileus. EXAM: ABDOMEN - 2 VIEW COMPARISON:  11/26/2016 FINDINGS: There is no significant bowel dilation to suggest obstruction. A few air-fluid levels are noted on the erect view. This suggests a mild adynamic ileus, similar to the prior study. There is no free air. Bowel anastomosis staples are noted in the left mid abdomen. There are clips in right upper quadrant from a prior cholecystectomy. No acute skeletal abnormality. IMPRESSION: 1. No  evidence of bowel obstruction or free air. 2. Air-filled loops of small bowel and colon with several air-fluid levels on the erect view, similar to the prior exam, consistent with a mild adynamic ileus. Electronically Signed   By: Lajean Manes M.D.   On: 11/28/2016 08:47   Dg Abd 2 Views  Result Date: 11/26/2016 CLINICAL DATA:  Abdominal pain. EXAM: ABDOMEN - 2 VIEW COMPARISON:  None. FINDINGS: Mild gaseous distention of the small bowel and colon likely reflecting an ileus. No pneumatosis, pneumoperitoneum or portal venous gas. There is no evidence of free air. No radio-opaque calculi or other significant radiographic abnormality is seen. IMPRESSION: Mild gaseous distention of the small bowel and colon likely reflecting an ileus. Electronically Signed   By: Kathreen Devoid   On: 11/26/2016 13:49   Wt Readings from Last 3 Encounters:  12/15/16 147 lb 12 oz (67 kg)  11/24/16 149 lb 0.5 oz (67.6 kg)  11/22/16 155 lb 9.6 oz (70.6 kg)   BP Readings from Last 3 Encounters:  12/15/16 126/68  11/30/16 (!) 120/56  11/22/16 (!) 110/50    ua pos for blood /leuk  Took azo however Also has hx of IC Was sent home on cephalexin   Results for orders placed or performed in visit on 12/15/16  POCT Urinalysis Dipstick (Automated)  Result Value Ref Range   Color, UA Orange    Clarity, UA Cloudy    Glucose, UA     Bilirubin, UA     Ketones, UA     Spec Grav, UA 1.025 1.010 - 1.025   Blood, UA 25 Ery/uL    pH, UA 5.5 5.0 - 8.0   Protein, UA     Urobilinogen, UA  0.2 or 1.0 E.U./dL   Nitrite, UA     Leukocytes, UA Large (3+) (A) Negative     More urinary symptoms for several days  Frequency and bladder pressure  Had just finished cephalexin 2 d ago   Also used yeast prep otc for yeast vaginal symptoms   Starting to finally feel better  HH is helpful and working with her no blood in urine No flank pain   Not a lot of post op pain / just a little twinge here and there Nausea is better and able  to eat/no abd pain   Leg is doing better - less swollen now and not painful  No side eff from eliquis 1 pill bid    Patient Active Problem List   Diagnosis Date Noted  . UTI (urinary tract infection) 12/15/2016  . Acute deep vein thrombosis (DVT) of proximal vein of left lower extremity ([place]) 11/24/2016  . Cancer of overlapping sites of colon ([place]) 09/23/2016  . Iron deficiency anemia due to chronic blood loss 09/23/2016  . Estrogen deficiency 07/30/2016  . Screening mammogram, encounter for 07/30/2016  . Routine general medical examination at a health care facility 07/27/2016  . Right buttock pain 06/27/2015  . Accidental fall 06/27/2015  . Left hip pain 06/27/2015  . Stroke (cerebrum) (  [place]) 05/06/2015  . HTN (hypertension) 05/06/2015  . HLD (hyperlipidemia) 05/06/2015  . GERD (gastroesophageal reflux disease) 05/06/2015  . CAD (coronary artery disease) 05/06/2015  . Insect bites 09/17/2014  . Joint pain 05/01/2013  . Pain in joint, ankle and foot 03/02/2013  . Seborrheic keratosis 07/05/2012  . Ankle joint pain 07/16/2011  . 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 Callus of foot 10/16/2010  . Coronary artery disease, non-occlusive 10/16/2010  . INSOMNIA 02/02/2010  . HAND PAIN, BILATERAL 01/09/2010  . CRAMP OF LIMB 01/09/2010  . HYPERGLYCEMIA 07/29/2009  . ALLERGIC RHINITIS 12/04/2008  . Vitamin D deficiency 04/30/2008  . COLONIC POLYPS 06/28/2007  . IBS 03/17/2007  . INTERSTITIAL CYSTITIS 03/17/2007  . Osteoporosis 03/17/2007   Past Medical History:  Diagnosis Date  . Anemia   . Arthritis   . Cancer ([name])    colon  . Coronary artery disease, non-occlusive   . Dyspnea    more so exertion  . Dysrhythmia    skipped beats every now and then  . GERD (gastroesophageal reflux disease)   . Heel spur   . HSV (herpes simplex virus) infection   . Hyperglycemia   . Hyperlipidemia   . Hypertension   . IBS (irritable bowel syndrome)   . IC (interstitial cystitis)   . Insomnia  disorder related to known organic factor   . Osteoporosis   . PONV (postoperative nausea and vomiting)    has had more surgeries, and had no problems  . Stroke ([place])    2018    . Vitamin D deficiency    Past Surgical History:  Procedure Laterality Date  . ABDOMINAL HYSTERECTOMY    . CARDIAC CATHETERIZATION  7/12   non obst dz -- not sure what yr  . CHOLECYSTECTOMY    . EYE SURGERY    . LAPAROSCOPIC RIGHT COLECTOMY Right 11/10/2016   Procedure: LAPAROSCOPIC EXTENDED RIGHT COLECTOMY;  Surgeon: [name], MD;  Location: [facility];  Service: General;  Laterality: Right;  . ROTATOR CUFF REPAIR     2 TIMES   Social History  Substance Use Topics  . Smoking status: Never Smoker  . Smokeless tobacco: Never Used  . Alcohol use 0.0 oz/week     Comment: rare   Family History  Problem Relation Age of Onset  . Alcohol abuse Mother   . Heart attack Mother   . GER disease Mother   . GER disease Father   . Alcohol abuse Father   . Stroke Father   . Tuberculosis Father   . Heart attack Father   . Aneurysm Father   . Mental illness Sister   . Depression Sister   . Breast cancer Neg Hx    Allergies  Allergen Reactions  . Salmon [Fish Allergy] Anaphylaxis    This happens with "CANNED SALMON" only, if eaten uncooked  . Ampicillin Nausea Only  . Codeine Nausea And Vomiting  . Flagyl [Metronidazole] Nausea And Vomiting    Patient was taking this along with Neomycin and could not differentiate which med triggered the nausea & vomiting  . Meperidine Hcl Nausea And Vomiting  . Neomycin Nausea And Vomiting    Patient was taking this along with Flagyl and could not differentiate which med triggered the nausea & vomiting  . Penicillins Other (See Comments)    Has patient had a PCN reaction causing immediate rash, facial/tongue/throat swelling, SOB or lightheadedness with hypotension: Unknown Has patient had a PCN reaction causing severe rash involving mucus membranes or skin necrosis:  No Has  patient had a PCN reaction that required hospitalization: No Has patient had a PCN reaction occurring within the last 10 years: No If all of the above answers are "NO", then may proceed with Cephalosporin use.   Marland Kitchen Propoxyphene Hcl Nausea And Vomiting  . Sulfa Antibiotics Hives  . Tramadol Hcl Nausea And Vomiting   Current Outpatient Prescriptions on File Prior to Visit  Medication Sig Dispense Refill  . atorvastatin (LIPITOR) 40 MG tablet Take 1 tablet (40 mg total) by mouth daily at 6 PM. 90 tablet 3  . b complex vitamins tablet Take 1 tablet by mouth daily.    . Ca Carbonate-Mag Hydroxide (ROLAIDS PO) Take 1-2 tablets by mouth every 8 (eight) hours as needed (for acid reflux).     . cholecalciferol (VITAMIN D) 1000 units tablet Take 2,000 Units by mouth daily.     . COMBIGAN 0.2-0.5 % ophthalmic solution Place 1 drop into both eyes at bedtime.   2  . ELIQUIS STARTER PACK (ELIQUIS STARTER PACK) 5 MG TABS Take as directed on package: start with two-5mg  tablets twice daily for 7 days. On day 8, switch to one-5mg  tablet twice daily. 1 each 0  . famotidine (PEPCID) 20 MG tablet Take 1 tablet (20 mg total) by mouth 2 (two) times daily. 60 tablet 0  . gabapentin (NEURONTIN) 100 MG capsule Take 1 capsule (100 mg total) by mouth 2 (two) times daily as needed. (Patient taking differently: Take 100 mg by mouth 2 (two) times daily as needed (for nerve pain). ) 30 capsule 1  . LUMIGAN 0.01 % SOLN Place 1 drop into both eyes every morning.     . Multiple Vitamin (MULTIVITAMIN WITH MINERALS) TABS tablet Take 1 tablet by mouth daily.    . polyethylene glycol (MIRALAX / GLYCOLAX) packet Take 17 g by mouth daily. 14 each 0  . valACYclovir (VALTREX) 500 MG tablet TAKE 1 TABLET (500 MG TOTAL) BY MOUTH 2 (TWO) TIMES DAILY. 6 tablet 2   No current facility-administered medications on file prior to visit.     Review of Systems  Constitutional: Positive for fatigue. Negative for activity change, appetite change,  fever and unexpected weight change.  HENT: Negative for congestion, ear pain, rhinorrhea, sinus pressure and sore throat.   Eyes: Negative for pain, redness and visual disturbance.  Respiratory: Negative for cough, shortness of breath and wheezing.   Cardiovascular: Negative for chest pain, palpitations and leg swelling.  Gastrointestinal: Negative for abdominal pain, blood in stool, constipation and diarrhea.  Endocrine: Negative for polydipsia and polyuria.  Genitourinary: Positive for dysuria, frequency and urgency. Negative for hematuria and pelvic pain.  Musculoskeletal: Negative for arthralgias, back pain and myalgias.  Skin: Negative for pallor and rash.  Allergic/Immunologic: Negative for environmental allergies.  Neurological: Negative for dizziness, syncope and headaches.  Hematological: Negative for adenopathy. Does not bruise/bleed easily.  Psychiatric/Behavioral: Negative for decreased concentration and dysphoric mood. The patient is not nervous/anxious.        Objective:   Physical Exam  Constitutional: She appears well-developed and well-nourished. No distress.  Well appearing   HENT:  Head: Normocephalic and atraumatic.  Mouth/Throat: Oropharynx is clear and moist.  Eyes: Pupils are equal, round, and reactive to light. Conjunctivae and EOM are normal.  Neck: Normal range of motion. Neck supple. No JVD present. Carotid bruit is not present. No thyromegaly present.  Cardiovascular: Normal rate, regular rhythm, normal heart sounds and intact distal pulses.  Exam reveals no gallop.  Pulmonary/Chest: Effort normal and breath sounds normal. No respiratory distress. She has no wheezes. She has no rales.  No crackles  Abdominal: Soft. Bowel sounds are normal. She exhibits no distension, no abdominal bruit and no mass. There is tenderness. There is no rebound and no guarding.  Nl bs all areas  Very mild tenderness abound incision  No suprapubic tenderness No cva tenderness    Musculoskeletal: She exhibits no edema or tenderness.  L leg -no swelling/redness/ palp cord or tenderness Nl perf  Neg homan's sign  Lymphadenopathy:    She has no cervical adenopathy.  Neurological: She is alert. She has normal reflexes.  Skin: Skin is warm and dry. No rash noted. No pallor.  Psychiatric: She has a normal mood and affect.  Mood is good           Assessment & Plan:   Problem List Items Addressed This Visit      Cardiovascular and Mediastinum   Acute deep vein thrombosis (DVT) of proximal vein of left lower extremity (HCC)    Doing well with eliquis  S/p both surgery and with cancer diagnosis causing likely hypercoag state  For f/u with heme/onc tomorrow  Currently leg feels better w/o pain or swelling  Reviewed hospital records, lab results and studies in detail          HTN (hypertension)    bp in fair control at this time  BP Readings from Last 1 Encounters:  12/15/16 126/68   No changes needed Disc lifstyle change with low sodium diet and exercise  This remains nl after hosp and DVT Reviewed hospital records, lab results and studies in detail        Relevant Orders   Basic metabolic panel (Completed)     Digestive   Cancer of overlapping sites of colon ([place]) (Chronic)    Doing clinically well s/p R hemicolectomy (complicated by post op ilius and also uti and DVT)  Clinically improved  For oncol f/u tomorrow Reviewed hospital records, lab results and studies in detail   Post op anemia- re check today      Relevant Medications   cephALEXin (KEFLEX) 250 MG capsule     Genitourinary   UTI (urinary tract infection)    Post op uti tx with cephalexin  Symptomatic again  Enc water intake  Pending cx  Continue abx for 5 more days pending cx report       Relevant Medications   cephALEXin (KEFLEX) 250 MG capsule   Other Relevant Orders   Urine Culture     Other   Iron deficiency anemia due to chronic blood loss    Reviewed hospital  records, lab results and studies in detail  S/p hemicolectomy for colon cancer  For f/u with oncol today  IV iron given in hosp Check cbc today       Relevant Orders   CBC with Differential/Platelet (Completed)    Other Visit Diagnoses    History of UTI    -  Primary   Relevant Medications   cephALEXin (KEFLEX) 250 MG capsule   Other Relevant Orders   POCT Urinalysis Dipstick (Automated) (Completed)

## 2016-12-16 ENCOUNTER — Ambulatory Visit (HOSPITAL_BASED_OUTPATIENT_CLINIC_OR_DEPARTMENT_OTHER): Payer: Medicare HMO | Admitting: Hematology

## 2016-12-16 ENCOUNTER — Encounter: Payer: Self-pay | Admitting: *Deleted

## 2016-12-16 ENCOUNTER — Telehealth: Payer: Self-pay

## 2016-12-16 ENCOUNTER — Telehealth: Payer: Self-pay | Admitting: Hematology

## 2016-12-16 VITALS — BP 142/69 | HR 87 | Temp 97.9°F | Resp 18 | Ht 64.0 in | Wt 145.3 lb

## 2016-12-16 DIAGNOSIS — Z8673 Personal history of transient ischemic attack (TIA), and cerebral infarction without residual deficits: Secondary | ICD-10-CM | POA: Diagnosis not present

## 2016-12-16 DIAGNOSIS — K5900 Constipation, unspecified: Secondary | ICD-10-CM

## 2016-12-16 DIAGNOSIS — K769 Liver disease, unspecified: Secondary | ICD-10-CM | POA: Diagnosis not present

## 2016-12-16 DIAGNOSIS — C188 Malignant neoplasm of overlapping sites of colon: Secondary | ICD-10-CM

## 2016-12-16 DIAGNOSIS — N390 Urinary tract infection, site not specified: Secondary | ICD-10-CM | POA: Diagnosis not present

## 2016-12-16 DIAGNOSIS — D649 Anemia, unspecified: Secondary | ICD-10-CM

## 2016-12-16 DIAGNOSIS — I10 Essential (primary) hypertension: Secondary | ICD-10-CM

## 2016-12-16 NOTE — Telephone Encounter (Signed)
Scheduled appt per 10/25 los - Gave patient AVS and calender per los.

## 2016-12-16 NOTE — Progress Notes (Signed)
[place]  [phone] Fax:([phone] [place]   Patient Care Team: Tower, [name], MD as PCP - General [name], MD as Consulting Physician (Hematology) [name], MD as Attending Physician (Gastroenterology) [name], MD as Consulting Physician (General Surgery) 12/16/2016   CHIEF COMPLAINTS:  Follow up for Colorectal adenocarcinoma, moderately differentiated    Cancer of overlapping sites of colon [facility])   08/31/2016 Tumor Marker    Patient's tumor was tested for the following markers: CEA. Results of the tumor marker test revealed 2.0.      08/31/2016 Pathology Results    Colon biopsy Diagnosis: 1. Surgical [P], hepatic flexure. ADENOCARCINOMA. Moderately differentiated. 2. Surgical [P], transverse. ADENOCARCINOMA, Moderately differentiated.      08/31/2016 Initial Diagnosis    Colon cancer metastasized to liver ([place])      08/31/2016 Procedure    - 1) Polypoid mass at the hepatic flexure, 2cm, soft but clearly neoplastic.  - 2) Clearly malignant mass in the distal transverse colon; 4cm, firm, 1/2 cirumference, ulcerated. ' - 3) Four small (3-57m) scattered typical appearing adenomas throughout the colon, notremoved since she mistakenly stayed on her plavix for this procedure.      09/01/2016 Imaging    CT Abdomen Pelvis W Contrast IMPRESSION: There are 2 colonic lesions involving the proximal ascending colon and proximal transverse colon, as detailed above, highly concerning for colonic neoplasm. In addition, there are at least 2 indeterminate liver lesions which are suspicious for potential metastatic disease.       09/16/2016 Imaging    Mr Liver W Wo Contrast IMPRESSION: Lesion within central right lobe of liver exhibits of peripheral enhancement and is suspicious for liver metastasis. Lesion within caudate lobe of liver identified on recent CT does not have a corresponding signal or enhancement abnormality on  today's study. Within the inferior right lobe of liver there is an enhancing structure which is favored to represent an atypical benign hemangioma with liver metastasis felt less likely. The transverse colon lesion is again identified compatible with colonic adenocarcinoma.      10/04/2016 PET scan    PET 10/04/16 IMPRESSION: 1. Highly hypermetabolic proximal transverse colon mass, maximum SUV 30.8. Small focus of hypermetabolic activity in the mesentery just above this mass probably represents a local involved lymph node. 2. Moderately hypermetabolic ascending colon mass, maximum SUV 8.7. 3. Highly hypermetabolic enlarged portacaval lymph node, maximum SUV 16.7. 4. Focus of hypermetabolic activity near the duodenum bulb could be physiologic or due to an adjacent lymph node. 5. None of the liver lesions are discernibly hypermetabolic. Particularly the more cephalad right hepatic lobe lesion merits surveillance, it had nonspecific enhancement characteristics on prior cross-sectional imaging and only a thin enhancing wall which could conceivably predispose to false negative due to central necrosis. 6. Several tiny chronic pulmonary nodules are likely benign. 7. Other imaging findings of potential clinical significance: Aortic Atherosclerosis (ICD10-I70.0). Coronary atherosclerosis. Bilateral nonobstructive nephrolithiasis.      11/10/2016 Surgery    LAPAROSCOPIC EXTENDED RIGHT COLECTOMY by Dr. [name]      11/10/2016 Pathology Results    Diagnosis 11/10/16 Colon, segmental resection for tumor, Ascending and Transverse ADENOCARCINOMA OF THE ASCENDING COLON WITH EXTRA CELLULAR MUCIN, GRADE 3, SIZE 3.4 CM THE TUMOR INVADES MUSCULARIS PROPRIA POORLY DIFFERENTIATED ADENOCARCINOMA OF THE TRANSVERSE COLON, GRADE 4, SIZE 6.1 CM THE TUMOR INVADES THROUGH MUSCULARIS PROPRIA INTO PERICOLONIC SOFT TISSUE EXTRAMURAL SATELLITE TUMOR NODULES IS IDENTIFIED (X1) ALL MARGINS OF RESECTION ARE NEGATIVE  FOR CARCINOMA METASTATIC CARCINOMA IN ONE OF SIXTY LYMPH NODES (1/60) TABULAR ADENOMA X1       HISTORY OF PRESENTING ILLNESS: 09/23/16 [name] 76 y.o. female is here because of Colorectal adenocarcinoma, moderately differentiated  Referred by Gastroenterologist, [name] for abnormal colon biopsy. Accompanied by family friend.   Pt initially presented with one episode of rectal bleeding one year ago, but did not pursue further workup until lately, she was referred to Gastroenterologist, [name] on 07/13/16 for Routine colonoscopy. She was referred to him by [name] for repeat colonoscopy for colon CA screening. Pt last colonoscopy was with [name] in August 2011 that was completed for hx of polyps and was significant for hemorrhoids and otherwise negative.   On 08/31/16, pt underwent screening colonoscopy, which unfortunately showed 2 masses in the proximal and distant transverse colon, biopsy both showed moderately differentiated adenocarcinoma.   Pt had a CT Abdomen Pelvis that was ordered by [name] and completed on 09/01/16 and significant for colonic neoplasm and 2 indeterminate liver lesions suspicious for potential metastatic dx.   Pt had a CT Chest W Constrast that was ordered by [name] and completed on 09/06/16 and significant for no metastasis.    Pt had MR Liver W WO Contrast that was ordered by [name] and completed on 09/16/16 and significant for colonic adenocarcinoma and indeterminate liver lesions.  Pt notes that she was referred to the office by [name]. Pt has an appointment with Surgeon, [name] tomorrow, 8/3 to discuss her options. She notes that [name] recommended a liver biopsy. Pt states that she had 1 year ago she had one large episode of rectal bleeding following consuming a milkshake, 1 episode of emesis, and a large episode of rectal bleeding that resolved on its own. Denies recurrent  bleeding following initial rectal bleeding. Denies being evaluated last year for her rectal bleeding due to having issues with finding a GI specialist. Pt last colonoscopy was 7 years ago. Pt reports that due to her chronic constipation, she uses suppositories and OTC laxatives. She states that she eats mainly meat and starches due to her dislike for vegetables. She notes that she recently lost approximately 10 lbs over the past year. 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 She states that she lives with her husband and has 3 older children, 2 in [place] and 1 in [place]. Pt friend notes that the pt grandchildren intermittently help her out. She states that her husband has severe medical issues with internal bleeding and is currently on oxygen. Pt reports that she completes all of the housework and outside work due to her husband being unable to complete daily activities.   Pt has intermittent GERD that she used to take Nexium for. Denies PMHx of HTN, cardiac issues. Denies taking HTN medications. Denies MI, but notes that she was evaluated for CP symptoms and was found to have a 20% blockage in her arteries. Pt reports that she had a stroke with resolved residual right sided weakness summer 2017 that she was evaluated the next day at [facility] and was informed by her horse vet to be evaluated. Pt states that she took 2 ASA following her initial symptoms. Pt reports that she takes ASA and plavix for. She states that she stopped taking her plavix 2 weeks ago following  the colonoscopy. Pt sates that she takes Rx Lipitor for cholesterol. Pt reports that she takes multi-vitamin and that [name] recommended that she takes vitamin-D.   Pt had cholecystectomy and partial hysterectomy with one ovary left. Pt denies family hx of CA. Denies smoking cigarettes, but reports that her husband used to smoke cigarettes. Pt  denies ETOH use at this time.   On review of systems, pt denies fever, chills. Endorses 10 lb weight loss, decreased energy levels. Denies pain. Pt reports constipation treated with laxatives. Pt reports that she has hemorrhoids. Pt denies abdominal pain, nausea, vomiting, abdominal distension.    CURRENT THERAPY:  PENDING FOLFOX every 2 weeks starting 12/23/16   INTERVAL HISTORY:  [name] is here for a follow up post surgrey. She presents to the clinic today to discuss adjuvant chemo for her colon cancer. She has bladder infection. She just started Keflex yesterday and should complete tomorrow.   She notes her surgery went well. She is not sure which of her children can help her get her to and from chemo treatments. Her husband is on oxygen so he is unable to help her. She is willing to start treatment.    MEDICAL HISTORY:  Past Medical History:  Diagnosis Date  . Anemia   . Arthritis   . Cancer ([place])    colon  . Coronary artery disease, non-occlusive   . Dyspnea    more so exertion  . Dysrhythmia    skipped beats every now and then  . GERD (gastroesophageal reflux disease)   . Heel spur   . HSV (herpes simplex virus) infection   . Hyperglycemia   . Hyperlipidemia   . Hypertension   . IBS (irritable bowel syndrome)   . IC (interstitial cystitis)   . Insomnia disorder related to known organic factor   . Osteoporosis   . PONV (postoperative nausea and vomiting)    has had more surgeries, and had no problems  . Stroke ([place])    2018    . Vitamin D deficiency     SURGICAL HISTORY: Past Surgical History:  Procedure Laterality Date  . ABDOMINAL HYSTERECTOMY    . CARDIAC CATHETERIZATION  7/12   non obst dz -- not sure what yr  . CHOLECYSTECTOMY    . EYE SURGERY    . LAPAROSCOPIC RIGHT COLECTOMY Right 11/10/2016   Procedure: LAPAROSCOPIC EXTENDED RIGHT COLECTOMY;  Surgeon: [name], MD;  Location: [facility];  Service: General;  Laterality: Right;  . ROTATOR  CUFF REPAIR     2 TIMES    SOCIAL HISTORY: Social History   Social History  . Marital status: Married    Spouse name: N/A  . Number of children: 4  . Years of education: N/A   Occupational History  . reitred Retired   Social History Main Topics  . Smoking status: Never Smoker  . Smokeless tobacco: Never Used  . Alcohol use 0.0 oz/week     Comment: rare  . Drug use: No  . Sexual activity: Not on file   Other Topics Concern  . Not on file   Social History Narrative   Rides horses         Specialty providers    [facility]   Cardio- [facility]   Ortho--[place]   Rheum-[facility]   urol-[facility]   ENT-- [facility]    FAMILY HISTORY: Family History  Problem Relation Age of Onset  . Alcohol abuse Mother   . Heart attack Mother   .  GER disease Mother   . GER disease Father   . Alcohol abuse Father   . Stroke Father   . Tuberculosis Father   . Heart attack Father   . Aneurysm Father   . Mental illness Sister   . Depression Sister   . Breast cancer Neg Hx     ALLERGIES:  is allergic to salmon [fish allergy]; ampicillin; codeine; flagyl [metronidazole]; meperidine hcl; neomycin; penicillins; propoxyphene hcl; sulfa antibiotics; and tramadol hcl.  MEDICATIONS:  Current Outpatient Prescriptions  Medication Sig Dispense Refill  . atorvastatin (LIPITOR) 40 MG tablet Take 1 tablet (40 mg total) by mouth daily at 6 PM. 90 tablet 3  . b complex vitamins tablet Take 1 tablet by mouth daily.    . Ca Carbonate-Mag Hydroxide (ROLAIDS PO) Take 1-2 tablets by mouth every 8 (eight) hours as needed (for acid reflux).     . cephALEXin (KEFLEX) 250 MG capsule Take 1 capsule (250 mg total) by mouth 2 (two) times daily. 10 capsule 0  . cholecalciferol (VITAMIN D) 1000 units tablet Take 2,000 Units by mouth daily.     . COMBIGAN 0.2-0.5 % ophthalmic solution Place 1 drop into both eyes at bedtime.   2  . ELIQUIS STARTER PACK (ELIQUIS STARTER PACK) 5 MG TABS Take as directed on  package: start with two-63m tablets twice daily for 7 days. On day 8, switch to one-536mtablet twice daily. 1 each 0  . famotidine (PEPCID) 20 MG tablet Take 1 tablet (20 mg total) by mouth 2 (two) times daily. 60 tablet 0  . gabapentin (NEURONTIN) 100 MG capsule Take 1 capsule (100 mg total) by mouth 2 (two) times daily as needed. (Patient taking differently: Take 100 mg by mouth 2 (two) times daily as needed (for nerve pain). ) 30 capsule 1  . LUMIGAN 0.01 % SOLN Place 1 drop into both eyes every morning.     . Multiple Vitamin (MULTIVITAMIN WITH MINERALS) TABS tablet Take 1 tablet by mouth daily.    . polyethylene glycol (MIRALAX / GLYCOLAX) packet Take 17 g by mouth daily. 14 each 0  . valACYclovir (VALTREX) 500 MG tablet TAKE 1 TABLET (500 MG TOTAL) BY MOUTH 2 (TWO) TIMES DAILY. 6 tablet 2   No current facility-administered medications for this visit.     REVIEW OF SYSTEMS:  Constitutional: Denies fevers, chills or abnormal night sweats (+) low appetite  Eyes: Denies blurriness of vision, double vision or watery eyes Ears, nose, mouth, throat, and face: Denies mucositis or sore throat Respiratory: Denies cough, dyspnea or wheezes Cardiovascular: Denies palpitation, chest discomfort or lower extremity swelling Gastrointestinal:  Denies nausea, heartburn (+) constipation  Skin: Denies abnormal skin rashes Lymphatics: Denies new lymphadenopathy or easy bruising Neurological:Denies numbness, tingling or new weaknesses Behavioral/Psych: Mood is stable, no new changes  All other systems were reviewed with the patient and are negative.  PHYSICAL EXAMINATION: ECOG PERFORMANCE STATUS: 1  Vitals:   12/16/16 1005  BP: (!) 142/69  Pulse: 87  Resp: 18  Temp: 97.9 F (36.6 C)  SpO2: 100%   Filed Weights   12/16/16 1005  Weight: 145 lb 4.8 oz (65.9 kg)    GENERAL:alert, no distress and comfortable SKIN: skin color, texture, turgor are normal, no rashes or significant lesions EYES:  normal, conjunctiva are pink and non-injected, sclera clear OROPHARYNX:no exudate, no erythema and lips, buccal mucosa, and tongue normal  NECK: supple, thyroid normal size, non-tender, without nodularity LYMPH:  no palpable lymphadenopathy in the cervical,  axillary or inguinal LUNGS: clear to auscultation and percussion with normal breathing effort HEART: regular rate & rhythm and no murmurs and no lower extremity edema ABDOMEN:abdomen soft, non-tender and normal bowel sounds. No organomegaly noted. (+) midline incision above umbilical , well healed, non-tender, no scar tissue.  Musculoskeletal:no cyanosis of digits and no clubbing  PSYCH: alert & oriented x 3 with fluent speech NEURO: no focal motor/sensory deficits   LABORATORY DATA:  I have reviewed the data as listed CBC Latest Ref Rng & Units 12/15/2016 11/30/2016 11/29/2016  WBC 4.0 - 10.5 K/uL 6.6 6.4 5.6  Hemoglobin 12.0 - 15.0 g/dL 10.6(L) 8.7(L) 9.0(L)  Hematocrit 36.0 - 46.0 % 33.0(L) 28.3(L) 28.3(L)  Platelets 150.0 - 400.0 K/uL 369.0 352 351    CMP Latest Ref Rng & Units 12/15/2016 11/28/2016 11/27/2016  Glucose 70 - 99 mg/dL 100(H) 108(H) 115(H)  BUN 6 - 23 mg/dL 25(H) 9 11  Creatinine 0.40 - 1.20 mg/dL 0.89 0.97 0.94  Sodium 135 - 145 mEq/L 138 135 131(L)  Potassium 3.5 - 5.1 mEq/L 3.8 3.3(L) 3.5  Chloride 96 - 112 mEq/L 103 97(L) 96(L)  CO2 19 - 32 mEq/L '28 28 27  ' Calcium 8.4 - 10.5 mg/dL 9.4 8.4(L) 8.3(L)  Total Protein 6.5 - 8.1 g/dL - 5.9(L) -  Total Bilirubin 0.3 - 1.2 mg/dL - 0.6 -  Alkaline Phos 38 - 126 U/L - 72 -  AST 15 - 41 U/L - 17 -  ALT 14 - 54 U/L - 18 -    CEA 08/31/16: 2.0   PATHOLOGY REPORT:    Diagnosis 11/10/16 Colon, segmental resection for tumor, Ascending and Transverse ADENOCARCINOMA OF THE ASCENDING COLON WITH EXTRA CELLULAR MUCIN, GRADE 3, SIZE 3.4 CM THE TUMOR INVADES MUSCULARIS PROPRIA POORLY DIFFERENTIATED ADENOCARCINOMA OF THE TRANSVERSE COLON, GRADE 4, SIZE 6.1 CM THE TUMOR  INVADES THROUGH MUSCULARIS PROPRIA INTO PERICOLONIC SOFT TISSUE EXTRAMURAL SATELLITE TUMOR NODULES IS IDENTIFIED (X1) ALL MARGINS OF RESECTION ARE NEGATIVE FOR CARCINOMA METASTATIC CARCINOMA IN ONE OF SIXTY LYMPH NODES (1/60) TABULAR ADENOMA X1 Microscopic Comment COLON AND RECTUM (INCLUDING TRANS-ANAL RESECTION): Specimen: Right and transverse colon Procedure: Segmental resection Tumor site: Ascending and transverse colon Specimen integrity: Intact Macroscopic intactness of mesorectum: Not applicable: X Complete: NA Near complete: NA Incomplete: NA Cannot be determined (specify): NA Macroscopic tumor perforation: Pericolonic soft tissue Invasive tumor: Maximum size: 6.1 cm and 3.4 cm Histologic type(s): Adenocarcinoma Histologic grade and differentiation: G1: well differentiated/low grade Microscopic Comment(continued) G2: moderately differentiated/low grade G3: poorly differentiated/high grade G4: undifferentiated/high grade Type of polyp in which invasive carcinoma arose: Tubular adenoma Microscopic extension of invasive tumor: Pericolonic soft tissue Lymph-Vascular invasion: Identified Peri-neural invasion: Negative Tumor deposit(s) (discontinuous extramural extension): Present Resection margins: Proximal margin: Negative Distal margin: Negative Circumferential (radial) (posterior ascending, posterior descending; lateral and posterior mid-rectum; and entire lower 1/3 rectum): Negative Mesenteric margin (sigmoid and transverse): Negative Distance closest margin (if all above margins negative): 1.5 cm from the mesenteric margin Trans-anal resection margins only: Deep margin: NA Mucosal Margin: NA Distance closest mucosal margin (if negative): NA Treatment effect (neo-adjuvant therapy): Negative Additional polyp(s): Tubular adenoma Non-neoplastic findings: Unremarkble Lymph nodes: number examined 60; number positive: 1 Pathologic Staging: pT3 (m), pN1c, pMx Ancillary  studies: Ordered    Colon biopsy, 08/31/16 Diagnosis 1. Surgical [P], hepatic flexure - ADENOCARCINOMA. Moderately differentiated.  2. Surgical [P], transverse - ADENOCARCINOMA, Moderately differentiated.  RADIOGRAPHIC STUDIES: I have personally reviewed the radiological images as listed and agreed with the findings in the  report. Ct Angio Chest Pe W And/or Wo Contrast  Result Date: 11/23/2016 CLINICAL DATA:  Nausea and vomiting with swelling in the feet. Recent surgery from September 19th. Right colectomy. EXAM: CT ANGIOGRAPHY CHEST WITH CONTRAST TECHNIQUE: Multidetector CT imaging of the chest was performed using the standard protocol during bolus administration of intravenous contrast. Multiplanar CT image reconstructions and MIPs were obtained to evaluate the vascular anatomy. CONTRAST:  100 mL Isovue 370 COMPARISON:  10/04/2016 PET-CT FINDINGS: Cardiovascular: Satisfactory opacification of the pulmonary arteries to the segmental level. No evidence of pulmonary embolism. Normal heart size. No pericardial effusion. Normal caliber thoracic aorta. No evidence of aortic dissection. Scattered calcifications in the aorta and coronary arteries. Great vessel origins are patent. Mediastinum/Nodes: No enlarged mediastinal, hilar, or axillary lymph nodes. Thyroid gland, trachea, and esophagus demonstrate no significant findings. Lungs/Pleura: Mild dependent changes in the lung bases. No airspace disease or consolidation in the lungs. Scattered peripheral nodules in the right lung, all measuring less than 4 mm. No significant changes. No pleural effusions. No pneumothorax. Upper Abdomen: Surgical absence of the gallbladder. No bile duct dilatation. Musculoskeletal: Degenerative changes in the spine. Benign-appearing sclerosis in a midthoracic vertebra. Schmorl's nodes. No destructive bone lesions. Review of the MIP images confirms the above findings. IMPRESSION: No evidence of significant pulmonary embolus. No  evidence of active pulmonary disease. Aortic Atherosclerosis (ICD10-I70.0). Electronically Signed   By: Lucienne Capers M.D.   On: 11/23/2016 22:14   Dg Abd 2 Views  Result Date: 11/28/2016 CLINICAL DATA:  Followup ileus. EXAM: ABDOMEN - 2 VIEW COMPARISON:  11/26/2016 FINDINGS: There is no significant bowel dilation to suggest obstruction. A few air-fluid levels are noted on the erect view. This suggests a mild adynamic ileus, similar to the prior study. There is no free air. Bowel anastomosis staples are noted in the left mid abdomen. There are clips in right upper quadrant from a prior cholecystectomy. No acute skeletal abnormality. IMPRESSION: 1. No evidence of bowel obstruction or free air. 2. Air-filled loops of small bowel and colon with several air-fluid levels on the erect view, similar to the prior exam, consistent with a mild adynamic ileus. Electronically Signed   By: Lajean Manes M.D.   On: 11/28/2016 08:47   Dg Abd 2 Views  Result Date: 11/26/2016 CLINICAL DATA:  Abdominal pain. EXAM: ABDOMEN - 2 VIEW COMPARISON:  None. FINDINGS: Mild gaseous distention of the small bowel and colon likely reflecting an ileus. No pneumatosis, pneumoperitoneum or portal venous gas. There is no evidence of free air. No radio-opaque calculi or other significant radiographic abnormality is seen. IMPRESSION: Mild gaseous distention of the small bowel and colon likely reflecting an ileus. Electronically Signed   By: Kathreen Devoid   On: 11/26/2016 13:49    PROCEDURES   COLONOSCOPY 08/31/2016 DR. JACOBS  - 1) Polypoid mass at the hepatic flexure, 2cm, soft but clearly neoplastic. This was biopsied and the site was injected with Spot. - 2) Clearly malignant mass in the distal transverse colon; 4cm, firm, 1/2 cirumference, ulcerated. This wsa biopsied and the site was also injected with Spot. - 3) Four small (3-42m) scattered typical appearing adenomas throughout the colon, not removed since she mistakenly  stayed on her plavix for this procedure.  ASSESSMENT & PLAN:   VKayana Thoen is a 76y.o. caucasian female with colorectal adenocarcinoma, moderately differentiated, found on screening colonoscopy.   1. Synchronized colon adenocarcinoma, moderately differentiated, in hepatic fracture and distal transverse colon, pT3(m)N1cM0, stage IIIB, MSI-H -I  have reviewed her colonoscopy, biopsy and CT and MRI image findings with patient and her friend in great details. -She has to synchronized colon mass in the transverse colon, post biopsy showed moderately-differentiated adenocarcinoma -CT and abdominal MRI showed 2 lesions in right lobe of liver, at least one lesion is very suspicious for metastatic disease. --We discussed 10/04/16 PET. There is no definitive distant metastasis on the PET scan. There was no uptake in her liver, liver metastasis is less likely, although not resulted. I'll speak with IR to see if we still need to proceed with liver biopsy, which is scheduled for this Friday. If we decided not to pursue liver biopsy, [name] may able to inspect her liver during the surgery to see if biopsy is necessary.  -We discussed the pathology results from her surgery that shows 2 tumor in her ascending and transverse colon, with 1/16 lymph nodes positive. 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 Due to her high risk of recurrence, I recommend her to consider adjuvant chemotherapy.  -I discussed the option of adjuvant chemotherapy, including FOLFOX, CAPOX or single agent 5-FU or Xeloda. Although she is 76 year old, she has good PS and good overall health, and would be a candidate for chemo. Due to her MSI-high disease, single adjuvant 5-fu or Xeloda may not be adequate. I recommend her to consider FOLFOX.   --Chemotherapy consent: Side effects including but does not not limited to, fatigue, nausea, vomiting, diarrhea, hair loss, neuropathy, fluid retention, renal and kidney dysfunction, neutropenic  fever, needed for blood transfusion, bleeding, were discussed with patient in great detail. She agrees to proceed. The goal of therapy is curative -Based on the recently published data, I recommend 3 months adjuvant therapy, instead of 6 months. -We currently have a adjuvant immunotherapy trial for stage III MSI high colon cancer, I strongly encouraged her to consider. She met our research nurse after my visit, and declined the trial.  -She will need liver MRI, chemo class and a port placement before starting chemo -She has recovered very well from chemotherapy. It has been 5 weeks since her surgery, I recommend her to start chemotherapy as soon as possible. -F/u next before first cycle chemo   2. 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 I recommend she sees a Dietitian to rule out lynch syndrome   3. Anemia -07/28/16 hemoglobin level 11.8 -Will check iron levels -Advised to began taking iron pill -Hg now 10.6 on 12/16/16  4. Liver lesions -Her initial staging CT scan showed numerous small liver lesions, indeterminate, those are not hypermetabolic on PET scan. -I recommend a liver MRI in the next few weeks for further evaluation and follow-up.  5. History of stroke  -No residual neuro deficits. She functions well at home. -Her aspirin and Plavix has been held since her colonoscopy, we'll continue holding them for now, and resume after liver biopsy.  6. ? HTN -her BP high in clinic previously, pt states it's been normal at home -will monitor  7. Constipation  -Secondary to tumors  -I suggest she takes stool softer to help her  8. UTI -Treating with Keflex.   Plan:  -Liver MRI, Port placement by [name] next week, and chemo class within a week -Lab, f/u and FOLFOX on 12/23/16 -Send Genetics referral  -Pt declined clinical trial options     Orders Placed This Encounter  Procedures  . MR Abdomen W Wo Contrast    Standing Status:   Future  Standing Expiration Date:   02/15/2018    Scheduling Instructions:     Follow up liver lesions    Order Specific Question:   If indicated for the ordered procedure, I authorize the administration of contrast media per Radiology protocol    Answer:   Yes    Order Specific Question:   What is the patient's sedation requirement?    Answer:   No Sedation    Order Specific Question:   Does the patient have a pacemaker or implanted devices?    Answer:   No    Order Specific Question:   Radiology Contrast Protocol - do NOT remove file path    Answer:   \\[email or web address]    Order Specific Question:   Preferred imaging location?    Answer:   [facility] (table limit-350 lbs)    All questions were answered. The patient knows to call the clinic with any problems, questions or concerns. I spent 30 minutes counseling the patient face to face. The total time spent in the appointment was 40 minutes and more than 50% was on counseling.  This document serves as a record of services personally performed by [name], MD. It was created on her behalf by Joslyn Devon, a trained medical scribe. The creation of this record is based on the scribe's personal observations and the provider's statements to them. This document has been checked and approved by the attending provider.     [name], MD 12/16/2016

## 2016-12-16 NOTE — Assessment & Plan Note (Signed)
bp in fair control at this time  BP Readings from Last 1 Encounters:  12/15/16 126/68   No changes needed Disc lifstyle change with low sodium diet and exercise  This remains nl after hosp and DVT Reviewed hospital records, lab results and studies in detail

## 2016-12-16 NOTE — Assessment & Plan Note (Addendum)
Doing well with eliquis  S/p both surgery and with cancer diagnosis causing likely hypercoag state  For f/u with heme/onc tomorrow  Currently leg feels better w/o pain or swelling  Reviewed hospital records, lab results and studies in detail

## 2016-12-16 NOTE — Assessment & Plan Note (Signed)
Post op uti tx with cephalexin  Symptomatic again  Enc water intake  Pending cx  Continue abx for 5 more days pending cx report

## 2016-12-16 NOTE — Assessment & Plan Note (Signed)
Reviewed hospital records, lab results and studies in detail  S/p hemicolectomy for colon cancer  For f/u with oncol today  IV iron given in hosp Check cbc today

## 2016-12-16 NOTE — Telephone Encounter (Signed)
Called and asked if [name] or one of the associates could place a port before next Thursday. Patient to start chemotherapy next Thursday.  [name] can place next Wednesday per office.   Instructed to fax note stating that port needs to be placed.

## 2016-12-16 NOTE — Assessment & Plan Note (Signed)
Doing clinically well s/p R hemicolectomy (complicated by post op ilius and also uti and DVT)  Clinically improved  For oncol f/u tomorrow Reviewed hospital records, lab results and studies in detail   Post op anemia- re check today

## 2016-12-16 NOTE — Telephone Encounter (Signed)
Faxed order from Dr. Burr Medico to [facility] to place port before chemo start 11-1.

## 2016-12-17 ENCOUNTER — Encounter (HOSPITAL_BASED_OUTPATIENT_CLINIC_OR_DEPARTMENT_OTHER): Payer: Self-pay | Admitting: *Deleted

## 2016-12-17 DIAGNOSIS — M810 Age-related osteoporosis without current pathological fracture: Secondary | ICD-10-CM | POA: Diagnosis not present

## 2016-12-17 DIAGNOSIS — Z483 Aftercare following surgery for neoplasm: Secondary | ICD-10-CM | POA: Diagnosis not present

## 2016-12-17 DIAGNOSIS — G4700 Insomnia, unspecified: Secondary | ICD-10-CM | POA: Diagnosis not present

## 2016-12-17 DIAGNOSIS — K589 Irritable bowel syndrome without diarrhea: Secondary | ICD-10-CM | POA: Diagnosis not present

## 2016-12-17 DIAGNOSIS — D509 Iron deficiency anemia, unspecified: Secondary | ICD-10-CM | POA: Diagnosis not present

## 2016-12-17 DIAGNOSIS — I2510 Atherosclerotic heart disease of native coronary artery without angina pectoris: Secondary | ICD-10-CM | POA: Diagnosis not present

## 2016-12-17 DIAGNOSIS — I10 Essential (primary) hypertension: Secondary | ICD-10-CM | POA: Diagnosis not present

## 2016-12-17 DIAGNOSIS — I82422 Acute embolism and thrombosis of left iliac vein: Secondary | ICD-10-CM | POA: Diagnosis not present

## 2016-12-17 DIAGNOSIS — E559 Vitamin D deficiency, unspecified: Secondary | ICD-10-CM | POA: Diagnosis not present

## 2016-12-17 DIAGNOSIS — C188 Malignant neoplasm of overlapping sites of colon: Secondary | ICD-10-CM | POA: Diagnosis not present

## 2016-12-18 ENCOUNTER — Encounter: Payer: Self-pay | Admitting: Hematology

## 2016-12-18 LAB — URINE CULTURE
MICRO NUMBER:: 81190965
SPECIMEN QUALITY:: ADEQUATE

## 2016-12-18 MED ORDER — ONDANSETRON HCL 8 MG PO TABS: 8.0000 mg | ORAL_TABLET | Freq: Two times a day (BID) | ORAL | 1 refills | Status: DC | PRN

## 2016-12-18 MED ORDER — LIDOCAINE-PRILOCAINE 2.5-2.5 % EX CREA: TOPICAL_CREAM | CUTANEOUS | 3 refills | Status: DC

## 2016-12-18 MED ORDER — PROCHLORPERAZINE MALEATE 10 MG PO TABS: 10.0000 mg | ORAL_TABLET | Freq: Four times a day (QID) | ORAL | 1 refills | Status: DC | PRN

## 2016-12-18 NOTE — Progress Notes (Signed)
START ON PATHWAY REGIMEN - Colorectal     A cycle is every 14 days:     Oxaliplatin      Leucovorin      5-Fluorouracil      5-Fluorouracil   **Always confirm dose/schedule in your pharmacy ordering system**    Patient Characteristics: Colon Adjuvant, Stage III, Low Risk (T1-3, N1) Current evidence of distant metastases<= No AJCC T Category: T3 AJCC N Category: N1c AJCC M Category: M0 AJCC 8 Stage Grouping: IIIB Intent of Therapy: Curative Intent, Discussed with Patient 

## 2016-12-20 DIAGNOSIS — Z483 Aftercare following surgery for neoplasm: Secondary | ICD-10-CM | POA: Diagnosis not present

## 2016-12-20 DIAGNOSIS — M810 Age-related osteoporosis without current pathological fracture: Secondary | ICD-10-CM | POA: Diagnosis not present

## 2016-12-20 DIAGNOSIS — E559 Vitamin D deficiency, unspecified: Secondary | ICD-10-CM | POA: Diagnosis not present

## 2016-12-20 DIAGNOSIS — G4700 Insomnia, unspecified: Secondary | ICD-10-CM | POA: Diagnosis not present

## 2016-12-20 DIAGNOSIS — I82422 Acute embolism and thrombosis of left iliac vein: Secondary | ICD-10-CM | POA: Diagnosis not present

## 2016-12-20 DIAGNOSIS — D509 Iron deficiency anemia, unspecified: Secondary | ICD-10-CM | POA: Diagnosis not present

## 2016-12-20 DIAGNOSIS — C188 Malignant neoplasm of overlapping sites of colon: Secondary | ICD-10-CM | POA: Diagnosis not present

## 2016-12-20 DIAGNOSIS — I10 Essential (primary) hypertension: Secondary | ICD-10-CM | POA: Diagnosis not present

## 2016-12-20 DIAGNOSIS — K589 Irritable bowel syndrome without diarrhea: Secondary | ICD-10-CM | POA: Diagnosis not present

## 2016-12-20 DIAGNOSIS — I2510 Atherosclerotic heart disease of native coronary artery without angina pectoris: Secondary | ICD-10-CM | POA: Diagnosis not present

## 2016-12-21 ENCOUNTER — Encounter: Payer: Self-pay | Admitting: Hematology

## 2016-12-21 ENCOUNTER — Other Ambulatory Visit: Payer: Self-pay | Admitting: General Surgery

## 2016-12-21 ENCOUNTER — Encounter: Payer: Self-pay | Admitting: *Deleted

## 2016-12-21 ENCOUNTER — Other Ambulatory Visit: Payer: Medicare HMO

## 2016-12-21 NOTE — Progress Notes (Signed)
Received PA request for Ondansetron.  Submitted request via Cover My Meds.  [name] (Key: [id number])  Need help? Call us at [phone]   Status  Sent to [employer]  Next Steps  The plan will fax you a determination, typically within 1 to 5 business days. How do I follow up?

## 2016-12-21 NOTE — Telephone Encounter (Signed)
Per patient request wanted to start 11/07 after 11/03 - YF okay.

## 2016-12-21 NOTE — Progress Notes (Signed)
Received PA determination from [facility] for Ondansetron.   PA approved 02/21/16-02/20/17 referral number [id number].  Copy of approval letter [facility] [place]. Fax received ok per confirmation sheet.

## 2016-12-21 NOTE — H&P (Signed)
[name] 12/10/2016 9:47 AM Location: [facility] Patient #: [id number] DOB: 1940-09-17 Married / Language: English / Race: White Female   History of Present Illness Yvonne Klein MD; 12/10/2016 10:26 AM) The patient is a 76 year old female who presents for a follow-up for Colorectal cancer. Pt is a 76 yo F referred by [name] for a new dx of colon cancer. She presented with rectal bleeding (last year) and increasing constipation this year. She underwent colonoscopy and was seen to have two cancers. She is on Plavix for h/o CVA. She has been having increasing difficulty having BMs. She denies abdominal pain. She works with horses and keeps 9 of them. She denied weight loss. She had a bit of dizziness. She denied continued bleeding from stools.    She underwent extended right hemicolectomy 11/09/2016. Path was pT3(m)N1M?. No carcinomatosis was seen. She had an ileus and diarrhea post op that improved. She was readmitted to medicine for a large DVT in her left leg extending to iliac artery. She had a UTI as well at that point. She was started on eliquis.  She denies pain. Her diet is improving. Her stools are no longer watery, but still a little loose compared to before. She does still feel like she has a little sensitivity "down there" when she urinates. She is driving and not taking any analgesics.    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 This was biopsied and the site was injected with Spot. - 2) Clearly malignant mass in the distal transverse colon; 4cm, firm, 1/2 cirumference, ulcerated. This wsa biopsied and the site was also injected with Spot. - 3) Four small (3-23mm) scattered typical appearing adenomas throughout the colon, not removed since she mistakenly stayed on her plavix for this procedure.  pathology 08/31/2016 Diagnosis 1. Surgical [P], hepatic flexure - ADENOCARCINOMA. - SEE MICROSCOPIC DESCRIPTION 2. Surgical [P], transverse - ADENOCARCINOMA.  MR liver 09/16/16 IMPRESSION: 1. Lesion within central right lobe of liver exhibits of peripheral enhancement and is suspicious for liver metastasis. Lesion within caudate lobe of liver identified on recent CT does not have a corresponding signal or enhancement abnormality on today's study. Within the inferior right lobe of liver there is an enhancing structure which is favored to represent an atypical benign hemangioma with liver metastasis felt less likely. Consider further staging with PET-CT. 2. The transverse colon lesion is again identified compatible with colonic adenocarcinoma 3. Aortic Atherosclerosis (ICD10-I70.0).  CT chest 09/06/2016 : 1. No CT findings to suggest metastatic disease in the chest. 2. Multiple right lung nodules are identified, but these were present on a previous CT from 6 years ago and are unchanged, consistent with benign etiology. 3. 12 mm low-density lesion in the right liver, incompletely visualized and better characterized on the recent abdomen and pelvis CT.  CT abd 09/01/2016 IMPRESSION: 1. There are 2 colonic lesions involving the proximal ascending colon and proximal transverse colon, as detailed above, highly concerning for colonic  neoplasm. In addition, there are at least 2 indeterminate liver lesions which  are suspicious for potential metastatic disease. Further evaluation with nonemergent abdominal MRI with and without IV gadolinium is strongly recommended in the near future to better characterize these lesions. 2. Aortic atherosclerosis. 3. Additional incidental findings, as above.  CEA 2.0 CMET nl 07/28/16 CBC nl x HCT 35.5 07/28/2016   Allergies ([name], CMA; 12/10/2016 9:50 AM) [name] *NUTRIENTS*  Ampicillin *PENICILLINS*  Codeine Phosphate *ANALGESICS - OPIOID*  Flagyl *ANTI-INFECTIVE AGENTS - MISC.*  Meperidine HCl *ANALGESICS - OPIOID*  Neomycin Sulfate (Topical) *DERMATOLOGICALS*  Penicillins  Propoxyphene and Methadone  Sulfa Antibiotics  Tramadol   Medication History ([name], CMA; 12/10/2016 9:51 AM) Eliquis (5MG  Tablet, Oral) Active. Cephalexin (500MG  Capsule, Oral) Active. Famotidine (20MG  Tablet, Oral) Active. Gabapentin (100MG  Capsule, Oral) Active. Polyethylene Glycol 3350 (Oral) Active. Aspirin (81MG  Tablet, Oral) Active. Vitamin D (1000UNIT Tablet, Oral) Active. Combigan (0.2-0.5% Solution, Ophthalmic) Active. Lumigan (0.01% Solution, Ophthalmic) Active. Multiple Vitamin (Oral) Active. Atorvastatin Calcium (40MG  Tablet, Oral) Active. Clopidogrel Bisulfate (75MG  Tablet, Oral) Active. ((plavix)) ValACYclovir HCl (500MG  Tablet, Oral) Active. Medications Reconciled    Review of Systems Yvonne Klein MD; 12/10/2016 10:26 AM) All other systems negative  Vitals ([name] CMA; 12/10/2016 9:52 AM) 12/10/2016 9:51 AM Weight: 142.13 lb Height: 64in Body Surface Area: 1.69 m Body Mass Index: 24.4 kg/m  Temp.: 98.53F(Oral)  Pulse: 78 (Regular)  BP: 150/70 (Sitting, Left Arm, Standard)       Physical Exam Yvonne Klein MD; 12/10/2016 10:27 AM) Abdomen Note: soft, non tender, non distended. wound healed.   Peripheral Vascular Note: no appreciable left lower extremity edema.     Assessment & Plan  Yvonne Klein MD; 12/10/2016 10:29 AM) ADENOCARCINOMA OF TRANSVERSE COLON (C18.4) Impression: Pt has 1/60 LN positive.  Will send back to oncology to discuss if additional treatment recommended. She is 76 yo, and has had a few issues recovering from surgery, but is in good shape overall and had a parent live to 30 yo.  I will see back in around 6 months barring unforeseen issues. Current Plans Pt Education - [employer] Free Text Education/Instructions: discussed with patient and provided information. Follow up with [place] in the office in 6 MONTHS.   Call us sooner as needed.    Signed by Yvonne Klein, MD (12/10/2016 10:29 AM)

## 2016-12-22 ENCOUNTER — Encounter: Payer: Self-pay | Admitting: Hematology

## 2016-12-22 ENCOUNTER — Ambulatory Visit (HOSPITAL_BASED_OUTPATIENT_CLINIC_OR_DEPARTMENT_OTHER)
Admission: RE | Admit: 2016-12-22 | Discharge: 2016-12-22 | Disposition: A | Discharge status: Home / Self Care | Payer: Medicare HMO | Source: Ambulatory Visit | Attending: General Surgery | Admitting: General Surgery

## 2016-12-22 ENCOUNTER — Ambulatory Visit (HOSPITAL_BASED_OUTPATIENT_CLINIC_OR_DEPARTMENT_OTHER): Payer: Medicare HMO | Admitting: Anesthesiology

## 2016-12-22 ENCOUNTER — Ambulatory Visit (HOSPITAL_COMMUNITY): Payer: Medicare HMO

## 2016-12-22 ENCOUNTER — Encounter (HOSPITAL_BASED_OUTPATIENT_CLINIC_OR_DEPARTMENT_OTHER): Payer: Self-pay | Admitting: Anesthesiology

## 2016-12-22 ENCOUNTER — Encounter (HOSPITAL_BASED_OUTPATIENT_CLINIC_OR_DEPARTMENT_OTHER)
Admission: RE | Disposition: A | Discharge status: Home / Self Care | Payer: Self-pay | Source: Ambulatory Visit | Attending: General Surgery

## 2016-12-22 DIAGNOSIS — I10 Essential (primary) hypertension: Secondary | ICD-10-CM | POA: Diagnosis not present

## 2016-12-22 DIAGNOSIS — C184 Malignant neoplasm of transverse colon: Secondary | ICD-10-CM | POA: Diagnosis not present

## 2016-12-22 DIAGNOSIS — Z452 Encounter for adjustment and management of vascular access device: Secondary | ICD-10-CM | POA: Diagnosis not present

## 2016-12-22 DIAGNOSIS — E785 Hyperlipidemia, unspecified: Secondary | ICD-10-CM | POA: Diagnosis not present

## 2016-12-22 DIAGNOSIS — Z4682 Encounter for fitting and adjustment of non-vascular catheter: Secondary | ICD-10-CM | POA: Diagnosis not present

## 2016-12-22 DIAGNOSIS — Z7901 Long term (current) use of anticoagulants: Secondary | ICD-10-CM | POA: Insufficient documentation

## 2016-12-22 DIAGNOSIS — C188 Malignant neoplasm of overlapping sites of colon: Secondary | ICD-10-CM | POA: Diagnosis not present

## 2016-12-22 DIAGNOSIS — I700 Atherosclerosis of aorta: Secondary | ICD-10-CM | POA: Diagnosis not present

## 2016-12-22 DIAGNOSIS — K769 Liver disease, unspecified: Secondary | ICD-10-CM | POA: Diagnosis not present

## 2016-12-22 DIAGNOSIS — C189 Malignant neoplasm of colon, unspecified: Secondary | ICD-10-CM | POA: Diagnosis present

## 2016-12-22 DIAGNOSIS — Z79899 Other long term (current) drug therapy: Secondary | ICD-10-CM | POA: Insufficient documentation

## 2016-12-22 DIAGNOSIS — Z419 Encounter for procedure for purposes other than remedying health state, unspecified: Secondary | ICD-10-CM

## 2016-12-22 HISTORY — PX: PORTACATH PLACEMENT: SHX2246

## 2016-12-22 SURGERY — INSERTION, TUNNELED CENTRAL VENOUS DEVICE, WITH PORT
Anesthesia: General | Site: Chest | Laterality: Left

## 2016-12-22 MED ORDER — CIPROFLOXACIN IN D5W 400 MG/200ML IV SOLN
INTRAVENOUS | Status: AC
  Filled 2016-12-22: qty 200

## 2016-12-22 MED ORDER — OXYCODONE HCL 5 MG PO TABS: 2.5000 mg | ORAL_TABLET | Freq: Four times a day (QID) | ORAL | 0 refills | Status: DC | PRN

## 2016-12-22 MED ORDER — LACTATED RINGERS IV SOLN
INTRAVENOUS | Status: DC
  Administered 2016-12-22: 14:00:00 via INTRAVENOUS

## 2016-12-22 MED ORDER — LIDOCAINE HCL (CARDIAC) 20 MG/ML IV SOLN
INTRAVENOUS | Status: DC | PRN
  Administered 2016-12-22: 100 mg via INTRAVENOUS

## 2016-12-22 MED ORDER — MIDAZOLAM HCL 2 MG/2ML IJ SOLN: 1.0000 mg | INTRAMUSCULAR | Status: DC | PRN

## 2016-12-22 MED ORDER — FENTANYL CITRATE (PF) 100 MCG/2ML IJ SOLN
50.0000 ug | INTRAMUSCULAR | Status: DC | PRN
  Administered 2016-12-22: 50 ug via INTRAVENOUS

## 2016-12-22 MED ORDER — DEXAMETHASONE SODIUM PHOSPHATE 4 MG/ML IJ SOLN
INTRAMUSCULAR | Status: DC | PRN
  Administered 2016-12-22: 10 mg via INTRAVENOUS

## 2016-12-22 MED ORDER — ONDANSETRON HCL 4 MG/2ML IJ SOLN
INTRAMUSCULAR | Status: DC | PRN
  Administered 2016-12-22: 4 mg via INTRAVENOUS

## 2016-12-22 MED ORDER — DEXAMETHASONE SODIUM PHOSPHATE 10 MG/ML IJ SOLN
INTRAMUSCULAR | Status: AC
  Filled 2016-12-22: qty 1

## 2016-12-22 MED ORDER — LIDOCAINE 2% (20 MG/ML) 5 ML SYRINGE
INTRAMUSCULAR | Status: AC
  Filled 2016-12-22: qty 5

## 2016-12-22 MED ORDER — CHLORHEXIDINE GLUCONATE CLOTH 2 % EX PADS: 6.0000 | MEDICATED_PAD | Freq: Once | CUTANEOUS | Status: DC

## 2016-12-22 MED ORDER — HEPARIN SOD (PORK) LOCK FLUSH 100 UNIT/ML IV SOLN
INTRAVENOUS | Status: DC | PRN
  Administered 2016-12-22: 500 [IU] via INTRAVENOUS

## 2016-12-22 MED ORDER — LIDOCAINE HCL 1 % IJ SOLN
INTRAMUSCULAR | Status: DC | PRN
  Administered 2016-12-22: 10 mL via SUBCUTANEOUS

## 2016-12-22 MED ORDER — PROPOFOL 10 MG/ML IV BOLUS
INTRAVENOUS | Status: DC | PRN
  Administered 2016-12-22: 150 mg via INTRAVENOUS

## 2016-12-22 MED ORDER — FENTANYL CITRATE (PF) 100 MCG/2ML IJ SOLN: 25.0000 ug | INTRAMUSCULAR | Status: DC | PRN

## 2016-12-22 MED ORDER — CIPROFLOXACIN IN D5W 400 MG/200ML IV SOLN
400.0000 mg | INTRAVENOUS | Status: AC
  Administered 2016-12-22: 400 mg via INTRAVENOUS

## 2016-12-22 MED ORDER — ONDANSETRON HCL 4 MG/2ML IJ SOLN: 4.0000 mg | Freq: Once | INTRAMUSCULAR | Status: DC | PRN

## 2016-12-22 MED ORDER — SCOPOLAMINE 1 MG/3DAYS TD PT72: 1.0000 | MEDICATED_PATCH | Freq: Once | TRANSDERMAL | Status: DC | PRN

## 2016-12-22 MED ORDER — PROPOFOL 10 MG/ML IV BOLUS
INTRAVENOUS | Status: AC
  Filled 2016-12-22: qty 20

## 2016-12-22 MED ORDER — ONDANSETRON HCL 4 MG/2ML IJ SOLN
INTRAMUSCULAR | Status: AC
  Filled 2016-12-22: qty 2

## 2016-12-22 MED ORDER — HEPARIN (PORCINE) IN NACL 2-0.9 UNIT/ML-% IJ SOLN
INTRAMUSCULAR | Status: AC | PRN
  Administered 2016-12-22: 1 via INTRAVENOUS

## 2016-12-22 MED ORDER — FENTANYL CITRATE (PF) 100 MCG/2ML IJ SOLN
INTRAMUSCULAR | Status: AC
  Filled 2016-12-22: qty 2

## 2016-12-22 SURGICAL SUPPLY — 47 items
ADH SKN CLS APL DERMABOND .7 (GAUZE/BANDAGES/DRESSINGS) ×1
BAG DECANTER FOR FLEXI CONT (MISCELLANEOUS) ×3 IMPLANT
BLADE HEX COATED 2.75 (ELECTRODE) ×3 IMPLANT
BLADE SURG 11 STRL SS (BLADE) ×3 IMPLANT
BLADE SURG 15 STRL LF DISP TIS (BLADE) ×1 IMPLANT
BLADE SURG 15 STRL SS (BLADE) ×3
CHLORAPREP W/TINT 26ML (MISCELLANEOUS) ×3 IMPLANT
COVER BACK TABLE 60X90IN (DRAPES) ×3 IMPLANT
COVER MAYO STAND STRL (DRAPES) ×3 IMPLANT
DECANTER SPIKE VIAL GLASS SM (MISCELLANEOUS) IMPLANT
DERMABOND ADVANCED (GAUZE/BANDAGES/DRESSINGS) ×2
DERMABOND ADVANCED .7 DNX12 (GAUZE/BANDAGES/DRESSINGS) ×1 IMPLANT
DRAPE C-ARM 42X72 X-RAY (DRAPES) ×3 IMPLANT
DRAPE LAPAROTOMY TRNSV 102X78 (DRAPE) ×3 IMPLANT
DRAPE UTILITY XL STRL (DRAPES) ×3 IMPLANT
DRSG TEGADERM 4X4.75 (GAUZE/BANDAGES/DRESSINGS) IMPLANT
ELECT REM PT RETURN 9FT ADLT (ELECTROSURGICAL) ×3
ELECTRODE REM PT RTRN 9FT ADLT (ELECTROSURGICAL) ×1 IMPLANT
GAUZE SPONGE 4X4 12PLY STRL LF (GAUZE/BANDAGES/DRESSINGS) IMPLANT
GLOVE BIO SURGEON STRL SZ 6 (GLOVE) ×5 IMPLANT
GLOVE BIOGEL M STRL SZ7.5 (GLOVE) ×2 IMPLANT
GLOVE BIOGEL PI IND STRL 6.5 (GLOVE) ×1 IMPLANT
GLOVE BIOGEL PI IND STRL 8 (GLOVE) IMPLANT
GLOVE BIOGEL PI INDICATOR 6.5 (GLOVE) ×2
GLOVE BIOGEL PI INDICATOR 8 (GLOVE) ×2
GOWN STRL REUS W/ TWL LRG LVL3 (GOWN DISPOSABLE) ×1 IMPLANT
GOWN STRL REUS W/ TWL XL LVL3 (GOWN DISPOSABLE) IMPLANT
GOWN STRL REUS W/TWL 2XL LVL3 (GOWN DISPOSABLE) ×3 IMPLANT
GOWN STRL REUS W/TWL LRG LVL3 (GOWN DISPOSABLE)
GOWN STRL REUS W/TWL XL LVL3 (GOWN DISPOSABLE) ×3
IV CONNECTOR ONE LINK NDLESS (IV SETS) IMPLANT
KIT PORT POWER 8FR ISP CVUE (Miscellaneous) ×2 IMPLANT
NDL HYPO 25X1 1.5 SAFETY (NEEDLE) ×1 IMPLANT
NEEDLE HYPO 25X1 1.5 SAFETY (NEEDLE) ×3 IMPLANT
PACK BASIN DAY SURGERY FS (CUSTOM PROCEDURE TRAY) ×3 IMPLANT
PENCIL BUTTON HOLSTER BLD 10FT (ELECTRODE) ×3 IMPLANT
SLEEVE SCD COMPRESS KNEE MED (MISCELLANEOUS) ×3 IMPLANT
SUT MNCRL AB 4-0 PS2 18 (SUTURE) ×3 IMPLANT
SUT PROLENE 2 0 SH DA (SUTURE) ×6 IMPLANT
SUT VIC AB 3-0 SH 27 (SUTURE) ×3
SUT VIC AB 3-0 SH 27X BRD (SUTURE) ×1 IMPLANT
SUT VICRYL 3-0 CR8 SH (SUTURE) IMPLANT
SYR 10ML LL (SYRINGE) ×3 IMPLANT
SYR 5ML LUER SLIP (SYRINGE) ×3 IMPLANT
SYR CONTROL 10ML LL (SYRINGE) ×3 IMPLANT
TOWEL OR 17X24 6PK STRL BLUE (TOWEL DISPOSABLE) ×3 IMPLANT
TOWEL OR NON WOVEN STRL DISP B (DISPOSABLE) ×3 IMPLANT

## 2016-12-22 NOTE — Transfer of Care (Signed)
Immediate Anesthesia Transfer of Care Note  Patient: [name]  Procedure(s) Performed: INSERTION PORT-A-CATH (Left Chest)  Patient Location: PACU  Anesthesia Type:General  Level of Consciousness: awake, alert  and oriented  Airway & Oxygen Therapy: Patient Spontanous Breathing and Patient connected to nasal cannula oxygen  Post-op Assessment: Report given to RN and Post -op Vital signs reviewed and stable  Post vital signs: Reviewed and stable  Last Vitals:  Vitals:   12/22/16 1600 12/22/16 1601  BP: 126/72   Pulse: 81 78  Resp:  11  Temp:  (P) 36.5 C  SpO2: 100% 100%    Last Pain:  Vitals:   12/22/16 1333  TempSrc: Oral         Complications: No apparent anesthesia complications

## 2016-12-22 NOTE — Discharge Instructions (Addendum)
[place] Office Phone Number [phone]   POST OP INSTRUCTIONS  Always review your discharge instruction sheet given to you by the facility where your surgery was performed.  IF YOU HAVE DISABILITY OR FAMILY LEAVE FORMS, YOU MUST BRING THEM TO THE OFFICE FOR PROCESSING.  DO NOT GIVE THEM TO YOUR DOCTOR.  1. A prescription for pain medication may be given to you upon discharge.  Take your pain medication as prescribed, if needed.  If narcotic pain medicine is not needed, then you may take acetaminophen (Tylenol) or ibuprofen (Advil) as needed. 2. Take your usually prescribed medications unless otherwise directed 3. If you need a refill on your pain medication, please contact your pharmacy.  They will contact our office to request authorization.  Prescriptions will not be filled after 5pm or on week-ends. 4. You should eat very light the first 24 hours after surgery, such as soup, crackers, pudding, etc.  Resume your normal diet the day after surgery 5. It is common to experience some constipation if taking pain medication after surgery.  Increasing fluid intake and taking a stool softener will usually help or prevent this problem from occurring.  A mild laxative (Milk of Magnesia or Miralax) should be taken according to package directions if there are no bowel movements after 48 hours. 6. You may shower in 48 hours.  The surgical glue will flake off in 2-3 weeks.   a. ACTIVITIES:  No strenuous activity or heavy lifting until cleared by surgery. b. You may drive when you no longer are taking prescription pain medication, you can comfortably wear a seatbelt, and you can safely maneuver your car and apply brakes. c. RETURN TO WORK:  __________8 weeks post colectomy_______________ Dennis Bast should see your doctor in the office for a follow-up appointment approximately three-four weeks after your surgery.    WHEN TO CALL YOUR DOCTOR: 1. Fever over 101.0 2. Nausea and/or  vomiting. 3. Extreme swelling or bruising. 4. Continued bleeding from incision. 5. Increased pain, redness, or drainage from the incision.  The clinic staff is available to answer your questions during regular business hours.  Please dont hesitate to call and ask to speak to one of the nurses for clinical concerns.  If you have a medical emergency, go to the nearest emergency room or call 911.  A surgeon from [facility] is always on call at the hospital.  For further questions, please visit [email or web address]    Post Anesthesia Home Care Instructions  Activity: Get plenty of rest for the remainder of the day. A responsible individual must stay with you for 24 hours following the procedure.  For the next 24 hours, DO NOT: -Drive a car -Paediatric nurse -Drink alcoholic beverages -Take any medication unless instructed by your physician -Make any legal decisions or sign important papers.  Meals: Start with liquid foods such as gelatin or soup. Progress to regular foods as tolerated. Avoid greasy, spicy, heavy foods. If nausea and/or vomiting occur, drink only clear liquids until the nausea and/or vomiting subsides. Call your physician if vomiting continues.  Special Instructions/Symptoms: Your throat may feel dry or sore from the anesthesia or the breathing tube placed in your throat during surgery. If this causes discomfort, gargle with warm salt water. The discomfort should disappear within 24 hours.  If you had a scopolamine patch placed behind your ear for the management of post- operative nausea and/or vomiting:  1. The medication in the patch is effective for 72 hours, after which it  should be removed.  Wrap patch in a tissue and discard in the trash. Wash hands thoroughly with soap and water. 2. You may remove the patch earlier than 72 hours if you experience unpleasant side effects which may include dry mouth, dizziness or visual disturbances. 3. Avoid  touching the patch. Wash your hands with soap and water after contact with the patch.

## 2016-12-22 NOTE — Anesthesia Preprocedure Evaluation (Signed)
Anesthesia Evaluation  Patient identified by MRN, date of birth, ID band Patient awake    Reviewed: Allergy & Precautions, NPO status , Patient's Chart, lab work & pertinent test results  History of Anesthesia Complications (+) PONV and history of anesthetic complications  Airway Mallampati: I  TM Distance: >3 FB Neck ROM: Full    Dental  (+) Teeth Intact, Dental Advisory Given, Missing, Implants, Poor Dentition   Pulmonary neg pulmonary ROS,    Pulmonary exam normal breath sounds clear to auscultation       Cardiovascular hypertension, + CAD and + DVT  Normal cardiovascular exam+ dysrhythmias  Rhythm:Regular Rate:Normal     Neuro/Psych CVA, No Residual Symptoms negative psych ROS   GI/Hepatic Neg liver ROS, GERD  ,  Endo/Other  negative endocrine ROS  Renal/GU negative Renal ROS     Musculoskeletal  (+) Arthritis , Osteoarthritis,    Abdominal   Peds  Hematology  (+) Blood dyscrasia, anemia ,   Anesthesia Other Findings Day of surgery medications reviewed with the patient.  Reproductive/Obstetrics                            Anesthesia Physical Anesthesia Plan  ASA: III  Anesthesia Plan: General   Post-op Pain Management:    Induction: Intravenous  PONV Risk Score and Plan: 4 or greater and Ondansetron, Dexamethasone and Treatment may vary due to age or medical condition  Airway Management Planned: LMA  Additional Equipment:   Intra-op Plan:   Post-operative Plan: Extubation in OR  Informed Consent: I have reviewed the patients History and Physical, chart, labs and discussed the procedure including the risks, benefits and alternatives for the proposed anesthesia with the patient or authorized representative who has indicated his/her understanding and acceptance.   Dental advisory given  Plan Discussed with: CRNA  Anesthesia Plan Comments: (Risks/benefits of general  anesthesia discussed with patient including risk of damage to teeth, lips, gum, and tongue, nausea/vomiting, allergic reactions to medications, and the possibility of heart attack, stroke and death.  All patient questions answered.  Patient wishes to proceed.)        Anesthesia Quick Evaluation

## 2016-12-22 NOTE — Progress Notes (Signed)
Received PA determination from [facility] for Lidocaine-Prilocaine cream.  PA approved 02/21/16-03/24/17.   [facility] [place] Fax received ok per confirmation sheet.

## 2016-12-22 NOTE — Interval H&P Note (Signed)
History and Physical Interval Note:  12/22/2016 3:01 PM  [name]  has presented today for surgery, with the diagnosis of COLON CANCER   The various methods of treatment have been discussed with the patient and family. After consideration of risks, benefits and other options for treatment, the patient has consented to  Procedure(s): INSERTION PORT-A-CATH (N/A) as a surgical intervention .  The patient's history has been reviewed, patient examined, no change in status, stable for surgery.  I have reviewed the patient's chart and labs.  Questions were answered to the patient's satisfaction.     [name]

## 2016-12-22 NOTE — Anesthesia Procedure Notes (Signed)
Procedure Name: LMA Insertion Date/Time: 12/22/2016 3:10 PM Performed by: Melynda Ripple D Pre-anesthesia Checklist: Patient identified, Emergency Drugs available, Suction available and Patient being monitored Patient Re-evaluated:Patient Re-evaluated prior to induction Oxygen Delivery Method: Circle system utilized Preoxygenation: Pre-oxygenation with 100% oxygen Induction Type: IV induction Ventilation: Mask ventilation without difficulty LMA: LMA inserted LMA Size: 4.0 Number of attempts: 1 Airway Equipment and Method: Bite block Placement Confirmation: positive ETCO2 Tube secured with: Tape Dental Injury: Teeth and Oropharynx as per pre-operative assessment

## 2016-12-22 NOTE — Progress Notes (Signed)
Received note from RN advising Lidocaine-prilocaine needs PA per pharmacy. Nothing sent to me from pharmacy indicating as of yet.  Submitted via Cover My Meds.  [place] (Key: [id number])  Need help? Call us at [phone]   Status  Sent to [employer]  Next Steps  The plan will fax you a determination, typically within 1 to 5 business days. How do I follow up?  DrugLidocaine-Prilocaine 2.5-2.5% cream  FormAetna Medicare Coverage Determination FormDO NOT USE FOR COVENTRY OR AETNA COMMERCIAL MEMBERS. Prescription Drug Coverage Determination for AETNA MEDICARE members [phone]

## 2016-12-22 NOTE — Op Note (Signed)
PREOPERATIVE DIAGNOSIS:  Colon cancer     POSTOPERATIVE DIAGNOSIS:  Same     PROCEDURE: left subclavian port placement, Bard ClearVue  Power Port, MRI safe, 8-French.      SURGEON:  [name], MD      ANESTHESIA:  General   FINDINGS:  Good venous return, easy flush, and tip of the catheter and   SVC 21 cm.      SPECIMEN:  None.      ESTIMATED BLOOD LOSS:  Minimal.      COMPLICATIONS:  None known.      PROCEDURE:  Pt was identified in the holding area and taken to   the operating room, where patient was placed supine on the operating room   table.  General anesthesia was induced.  Patient's arms were tucked and the upper   chest and neck were prepped and draped in sterile fashion.  Time-out was   performed according to the surgical safety check list.  When all was   correct, we continued.   Local anesthetic was administered over this   area at the angle of the clavicle.  The vein was accessed with 1 pass(es) of the needle. There was good venous return and the wire passed easily with no ectopy.   Fluoroscopy was used to confirm that the wire was in the vena cava.      The patient was placed back level and the area for the pocket was anethetized   with local anesthetic.  A 3-cm transverse incision was made with a #15   blade.  Cautery was used to divide the subcutaneous tissues down to the   pectoralis muscle.  An [employer] retractor was used to elevate the skin   while a pocket was created on top of the pectoralis fascia.  The port   was placed into the pocket to confirm that it was of adequate size.  The   catheter was preattached to the port.  The port was then secured to the   pectoralis fascia with four 2-0 Prolene sutures.  These were clamped and   not tied down yet.    The catheter was tunneled through to the wire exit   site.  The catheter was placed along the wire to determine what length it should be to be in the SVC.  The catheter was cut at 21 cm.  The tunneler  sheath and dilator were passed over the wire and the dilator and wire were removed.  The catheter was advanced through the tunneler sheath and the tunneler sheath was pulled away.  Care was taken to keep the catheter in the tunneler sheath as this occurred. This was advanced and the tunneler sheath was removed.  There was good venous   return and easy flush of the catheter.  The Prolene sutures were tied   down to the pectoral fascia.  The skin was reapproximated using 3-0   Vicryl interrupted deep dermal sutures.    Fluoroscopy was used to re-confirm good position of the catheter.  The skin   was then closed using 4-0 Monocryl in a subcuticular fashion.  The port was flushed with concentrated heparin flush as well.  The wounds were then cleaned, dried, and dressed with Dermabond.  The patient was awakened from anesthesia and taken to the PACU in stable condition.  Needle, sponge, and instrument counts were correct.               [name], MD

## 2016-12-23 ENCOUNTER — Telehealth: Payer: Self-pay

## 2016-12-23 ENCOUNTER — Ambulatory Visit: Payer: Medicare HMO

## 2016-12-23 ENCOUNTER — Ambulatory Visit: Payer: Medicare HMO | Admitting: Nurse Practitioner

## 2016-12-23 ENCOUNTER — Other Ambulatory Visit: Payer: Medicare HMO

## 2016-12-23 DIAGNOSIS — R32 Unspecified urinary incontinence: Secondary | ICD-10-CM

## 2016-12-23 NOTE — Telephone Encounter (Signed)
Discussed with pt/she is now scheduled for Urine C&S at 11:30am on Monday/orders placed/thx dmf

## 2016-12-23 NOTE — Anesthesia Postprocedure Evaluation (Signed)
Anesthesia Post Note  Patient: [name]  Procedure(s) Performed: INSERTION PORT-A-CATH (Left Chest)     Patient location during evaluation: PACU Anesthesia Type: General Level of consciousness: awake and alert Pain management: pain level controlled Vital Signs Assessment: post-procedure vital signs reviewed and stable Respiratory status: spontaneous breathing, nonlabored ventilation, respiratory function stable and patient connected to nasal cannula oxygen Cardiovascular status: blood pressure returned to baseline and stable Postop Assessment: no apparent nausea or vomiting Anesthetic complications: no    Last Vitals:  Vitals:   12/22/16 1615 12/22/16 1633  BP: 124/69 139/68  Pulse: 73 72  Resp: 15 20  Temp:  36.6 C  SpO2: 99% 99%    Last Pain:  Vitals:   12/22/16 1633  TempSrc: Oral   Pain Goal:                 Catalina Gravel

## 2016-12-23 NOTE — Addendum Note (Signed)
Addendum  created 12/23/16 1230 by Maryella Shivers, CRNA   Charge Capture section accepted

## 2016-12-23 NOTE — Telephone Encounter (Signed)
-----   Message from Abner Greenspan, MD sent at 12/21/2016  5:14 PM EDT ----- Please re culture urine when able to make sure it is clear   (she is in the midst of cancer treatment so it may be a bit before she can get in to give a sample) If it is -we may need to have her see urology regarding the incontinence  Thanks for the update   By the way-I typed cipro in the response-meant keflex--sorry about that

## 2016-12-24 ENCOUNTER — Encounter (HOSPITAL_BASED_OUTPATIENT_CLINIC_OR_DEPARTMENT_OTHER): Payer: Self-pay | Admitting: General Surgery

## 2016-12-24 DIAGNOSIS — I2510 Atherosclerotic heart disease of native coronary artery without angina pectoris: Secondary | ICD-10-CM | POA: Diagnosis not present

## 2016-12-24 DIAGNOSIS — K589 Irritable bowel syndrome without diarrhea: Secondary | ICD-10-CM | POA: Diagnosis not present

## 2016-12-24 DIAGNOSIS — M810 Age-related osteoporosis without current pathological fracture: Secondary | ICD-10-CM | POA: Diagnosis not present

## 2016-12-24 DIAGNOSIS — I10 Essential (primary) hypertension: Secondary | ICD-10-CM | POA: Diagnosis not present

## 2016-12-24 DIAGNOSIS — Z483 Aftercare following surgery for neoplasm: Secondary | ICD-10-CM | POA: Diagnosis not present

## 2016-12-24 DIAGNOSIS — G4700 Insomnia, unspecified: Secondary | ICD-10-CM | POA: Diagnosis not present

## 2016-12-24 DIAGNOSIS — I82422 Acute embolism and thrombosis of left iliac vein: Secondary | ICD-10-CM | POA: Diagnosis not present

## 2016-12-24 DIAGNOSIS — E559 Vitamin D deficiency, unspecified: Secondary | ICD-10-CM | POA: Diagnosis not present

## 2016-12-24 DIAGNOSIS — C188 Malignant neoplasm of overlapping sites of colon: Secondary | ICD-10-CM | POA: Diagnosis not present

## 2016-12-24 DIAGNOSIS — D509 Iron deficiency anemia, unspecified: Secondary | ICD-10-CM | POA: Diagnosis not present

## 2016-12-27 ENCOUNTER — Telehealth: Payer: Self-pay | Admitting: Hematology

## 2016-12-27 ENCOUNTER — Other Ambulatory Visit: Payer: Medicare HMO

## 2016-12-27 ENCOUNTER — Encounter: Payer: Self-pay | Admitting: Hematology

## 2016-12-27 DIAGNOSIS — R32 Unspecified urinary incontinence: Secondary | ICD-10-CM

## 2016-12-27 NOTE — Telephone Encounter (Signed)
Called patient regarding update to schedule

## 2016-12-27 NOTE — Progress Notes (Signed)
Submitted auth request for Ondansetron today.  Status is pending.  °

## 2016-12-28 ENCOUNTER — Ambulatory Visit (HOSPITAL_COMMUNITY)
Admission: RE | Admit: 2016-12-28 | Discharge: 2016-12-28 | Disposition: A | Discharge status: Home / Self Care | Payer: Medicare HMO | Source: Ambulatory Visit | Attending: Hematology | Admitting: Hematology

## 2016-12-28 ENCOUNTER — Encounter: Payer: Self-pay | Admitting: Hematology

## 2016-12-28 ENCOUNTER — Telehealth: Payer: Self-pay | Admitting: Family Medicine

## 2016-12-28 DIAGNOSIS — C189 Malignant neoplasm of colon, unspecified: Secondary | ICD-10-CM | POA: Diagnosis not present

## 2016-12-28 DIAGNOSIS — C188 Malignant neoplasm of overlapping sites of colon: Secondary | ICD-10-CM | POA: Insufficient documentation

## 2016-12-28 DIAGNOSIS — K7689 Other specified diseases of liver: Secondary | ICD-10-CM | POA: Insufficient documentation

## 2016-12-28 DIAGNOSIS — Z9889 Other specified postprocedural states: Secondary | ICD-10-CM | POA: Diagnosis not present

## 2016-12-28 MED ORDER — GADOBENATE DIMEGLUMINE 529 MG/ML IV SOLN
15.0000 mL | Freq: Once | INTRAVENOUS | Status: AC | PRN
  Administered 2016-12-28: 14 mL via INTRAVENOUS

## 2016-12-28 NOTE — Progress Notes (Signed)
Pt's Ondansetron was approved from 02/20/17 to 02/21/18.  Referral number [id number].

## 2016-12-28 NOTE — Telephone Encounter (Signed)
Copied from [place] #[id number]. Topic: Quick Communication - See Telephone Encounter >> Dec 28, 2016  9:21 AM Arletha Grippe wrote: CRM for notification. See Telephone encounter for:   12/28/16.[facility] called for verbal orders to extend [facility] physical therapy to work on strength.  Freq: 1 week 1 , 2 week 1, 1 week1, and 2 week 2 .  This is because she aslo has chemo  Cb number is [phone]

## 2016-12-28 NOTE — Telephone Encounter (Signed)
Verbal order given to [facility]. I had to call [place] directly due to phone # given from agent missing a #, Kendra's call back # is [phone]

## 2016-12-28 NOTE — Telephone Encounter (Signed)
Please ok that verbal order  

## 2016-12-29 ENCOUNTER — Telehealth: Payer: Self-pay

## 2016-12-29 ENCOUNTER — Other Ambulatory Visit: Payer: Medicare HMO

## 2016-12-29 ENCOUNTER — Telehealth: Payer: Self-pay | Admitting: Hematology

## 2016-12-29 ENCOUNTER — Encounter: Payer: Self-pay | Admitting: Nurse Practitioner

## 2016-12-29 ENCOUNTER — Ambulatory Visit (HOSPITAL_BASED_OUTPATIENT_CLINIC_OR_DEPARTMENT_OTHER): Payer: Medicare HMO

## 2016-12-29 ENCOUNTER — Other Ambulatory Visit: Payer: Self-pay

## 2016-12-29 ENCOUNTER — Ambulatory Visit: Payer: Medicare HMO

## 2016-12-29 ENCOUNTER — Other Ambulatory Visit (HOSPITAL_BASED_OUTPATIENT_CLINIC_OR_DEPARTMENT_OTHER): Payer: Medicare HMO

## 2016-12-29 ENCOUNTER — Ambulatory Visit (HOSPITAL_BASED_OUTPATIENT_CLINIC_OR_DEPARTMENT_OTHER): Payer: Medicare HMO | Admitting: Nurse Practitioner

## 2016-12-29 VITALS — BP 156/71 | HR 84 | Temp 97.9°F | Resp 18 | Ht 64.0 in | Wt 147.0 lb

## 2016-12-29 DIAGNOSIS — Z5111 Encounter for antineoplastic chemotherapy: Secondary | ICD-10-CM

## 2016-12-29 DIAGNOSIS — Z95828 Presence of other vascular implants and grafts: Secondary | ICD-10-CM

## 2016-12-29 DIAGNOSIS — D649 Anemia, unspecified: Secondary | ICD-10-CM | POA: Diagnosis not present

## 2016-12-29 DIAGNOSIS — I10 Essential (primary) hypertension: Secondary | ICD-10-CM | POA: Diagnosis not present

## 2016-12-29 DIAGNOSIS — K5900 Constipation, unspecified: Secondary | ICD-10-CM | POA: Diagnosis not present

## 2016-12-29 DIAGNOSIS — R030 Elevated blood-pressure reading, without diagnosis of hypertension: Secondary | ICD-10-CM

## 2016-12-29 DIAGNOSIS — C188 Malignant neoplasm of overlapping sites of colon: Secondary | ICD-10-CM

## 2016-12-29 DIAGNOSIS — N390 Urinary tract infection, site not specified: Secondary | ICD-10-CM

## 2016-12-29 DIAGNOSIS — Z8673 Personal history of transient ischemic attack (TIA), and cerebral infarction without residual deficits: Secondary | ICD-10-CM | POA: Diagnosis not present

## 2016-12-29 DIAGNOSIS — K769 Liver disease, unspecified: Secondary | ICD-10-CM

## 2016-12-29 DIAGNOSIS — C787 Secondary malignant neoplasm of liver and intrahepatic bile duct: Secondary | ICD-10-CM

## 2016-12-29 DIAGNOSIS — Z23 Encounter for immunization: Secondary | ICD-10-CM | POA: Diagnosis not present

## 2016-12-29 DIAGNOSIS — R591 Generalized enlarged lymph nodes: Secondary | ICD-10-CM

## 2016-12-29 DIAGNOSIS — R599 Enlarged lymph nodes, unspecified: Secondary | ICD-10-CM

## 2016-12-29 DIAGNOSIS — C189 Malignant neoplasm of colon, unspecified: Secondary | ICD-10-CM

## 2016-12-29 DIAGNOSIS — D508 Other iron deficiency anemias: Secondary | ICD-10-CM

## 2016-12-29 DIAGNOSIS — D500 Iron deficiency anemia secondary to blood loss (chronic): Secondary | ICD-10-CM

## 2016-12-29 LAB — URINE CULTURE
MICRO NUMBER:: 81239922
SPECIMEN QUALITY:: ADEQUATE

## 2016-12-29 LAB — COMPREHENSIVE METABOLIC PANEL
ALT: 11 U/L (ref 0–55)
AST: 12 U/L (ref 5–34)
Albumin: 3.3 g/dL — ABNORMAL LOW (ref 3.5–5.0)
Alkaline Phosphatase: 73 U/L (ref 40–150)
Anion Gap: 7 mEq/L (ref 3–11)
BUN: 13.8 mg/dL (ref 7.0–26.0)
CO2: 23 mEq/L (ref 22–29)
Calcium: 8.9 mg/dL (ref 8.4–10.4)
Chloride: 110 mEq/L — ABNORMAL HIGH (ref 98–109)
Creatinine: 0.8 mg/dL (ref 0.6–1.1)
EGFR: >60 mL/min/{1.73_m2} (ref >60)
Glucose: 113 mg/dl (ref 70–140)
Potassium: 3.7 mEq/L (ref 3.5–5.1)
Sodium: 140 mEq/L (ref 136–145)
Total Bilirubin: 0.43 mg/dL (ref 0.20–1.20)
Total Protein: 6.6 g/dL (ref 6.4–8.3)

## 2016-12-29 LAB — FERRITIN: Ferritin: 24 ng/ml (ref 9–269)

## 2016-12-29 LAB — CBC & DIFF AND RETIC
BASO%: 0.8 % (ref 0.0–2.0)
Basophils Absolute: 0.1 10*3/uL (ref 0.0–0.1)
EOS%: 2.7 % (ref 0.0–7.0)
Eosinophils Absolute: 0.2 10*3/uL (ref 0.0–0.5)
HCT: 29.2 % — ABNORMAL LOW (ref 34.8–46.6)
HGB: 9.3 g/dL — ABNORMAL LOW (ref 11.6–15.9)
Immature Retic Fract: 8.8 % (ref 1.60–10.00)
LYMPH%: 30.7 % (ref 14.0–49.7)
MCH: 27.3 pg (ref 25.1–34.0)
MCHC: 32 g/dL (ref 31.5–36.0)
MCV: 85.3 fL (ref 79.5–101.0)
MONO#: 0.7 10*3/uL (ref 0.1–0.9)
MONO%: 10.8 % (ref 0.0–14.0)
NEUT#: 3.5 10*3/uL (ref 1.5–6.5)
NEUT%: 55 % (ref 38.4–76.8)
Platelets: 249 10*3/uL (ref 145–400)
RBC: 3.43 10*6/uL — ABNORMAL LOW (ref 3.70–5.45)
RDW: 16.4 % — ABNORMAL HIGH (ref 11.2–14.5)
Retic %: 2.74 % — ABNORMAL HIGH (ref 0.70–2.10)
Retic Ct Abs: 93.98 10*3/uL — ABNORMAL HIGH (ref 33.70–90.70)
WBC: 6.3 10*3/uL (ref 3.9–10.3)
lymph#: 1.9 10*3/uL (ref 0.9–3.3)

## 2016-12-29 LAB — IRON AND TIBC
%SAT: 7 % — ABNORMAL LOW (ref 21–57)
Iron: 22 ug/dL — ABNORMAL LOW (ref 41–142)
TIBC: 334 ug/dL (ref 236–444)
UIBC: 311 ug/dL (ref 120–384)

## 2016-12-29 MED ORDER — INFLUENZA VAC SPLIT HIGH-DOSE 0.5 ML IM SUSY
0.5000 mL | PREFILLED_SYRINGE | Freq: Once | INTRAMUSCULAR | Status: AC
  Administered 2016-12-29: 0.5 mL via INTRAMUSCULAR

## 2016-12-29 MED ORDER — PALONOSETRON HCL INJECTION 0.25 MG/5ML
0.2500 mg | Freq: Once | INTRAVENOUS | Status: AC
  Administered 2016-12-29: 0.25 mg via INTRAVENOUS

## 2016-12-29 MED ORDER — SODIUM CHLORIDE 0.9% FLUSH
10.0000 mL | INTRAVENOUS | Status: DC | PRN
  Administered 2016-12-29: 10 mL via INTRAVENOUS
  Filled 2016-12-29: qty 10

## 2016-12-29 MED ORDER — LEUCOVORIN CALCIUM INJECTION 350 MG
400.0000 mg/m2 | Freq: Once | INTRAVENOUS | Status: AC
  Administered 2016-12-29: 688 mg via INTRAVENOUS
  Filled 2016-12-29: qty 34.4

## 2016-12-29 MED ORDER — INFLUENZA VAC SPLIT HIGH-DOSE 0.5 ML IM SUSY: 0.5000 mL | PREFILLED_SYRINGE | INTRAMUSCULAR | Status: DC

## 2016-12-29 MED ORDER — DEXAMETHASONE SODIUM PHOSPHATE 10 MG/ML IJ SOLN
10.0000 mg | Freq: Once | INTRAMUSCULAR | Status: AC
  Administered 2016-12-29: 10 mg via INTRAVENOUS

## 2016-12-29 MED ORDER — DEXAMETHASONE SODIUM PHOSPHATE 10 MG/ML IJ SOLN
INTRAMUSCULAR | Status: AC
  Filled 2016-12-29: qty 1

## 2016-12-29 MED ORDER — INFLUENZA VAC SPLIT QUAD 0.5 ML IM SUSY: 0.5000 mL | PREFILLED_SYRINGE | Freq: Once | INTRAMUSCULAR | Status: DC

## 2016-12-29 MED ORDER — OXALIPLATIN CHEMO INJECTION 100 MG/20ML
85.0000 mg/m2 | Freq: Once | INTRAVENOUS | Status: AC
  Administered 2016-12-29: 145 mg via INTRAVENOUS
  Filled 2016-12-29: qty 29

## 2016-12-29 MED ORDER — DEXTROSE 5 % IV SOLN
Freq: Once | INTRAVENOUS | Status: AC
  Administered 2016-12-29: 14:00:00 via INTRAVENOUS

## 2016-12-29 MED ORDER — PALONOSETRON HCL INJECTION 0.25 MG/5ML
INTRAVENOUS | Status: AC
  Filled 2016-12-29: qty 5

## 2016-12-29 MED ORDER — SODIUM CHLORIDE 0.9 % IV SOLN
2400.0000 mg/m2 | INTRAVENOUS | Status: DC
  Administered 2016-12-29: 4150 mg via INTRAVENOUS
  Filled 2016-12-29: qty 83

## 2016-12-29 MED ORDER — FLUOROURACIL CHEMO INJECTION 2.5 GM/50ML
400.0000 mg/m2 | Freq: Once | INTRAVENOUS | Status: AC
  Administered 2016-12-29: 700 mg via INTRAVENOUS
  Filled 2016-12-29: qty 14

## 2016-12-29 NOTE — Patient Instructions (Signed)
[facility] Discharge Instructions for Patients Receiving Chemotherapy  Today you received the following chemotherapy agents Oxaliplatin, Leucovorin, 5FU.  To help prevent nausea and vomiting after your treatment, we encourage you to take your nausea medication as prescribed.   If you develop nausea and vomiting that is not controlled by your nausea medication, call the clinic.   BELOW ARE SYMPTOMS THAT SHOULD BE REPORTED IMMEDIATELY:  *FEVER GREATER THAN 100.5 F  *CHILLS WITH OR WITHOUT FEVER  NAUSEA AND VOMITING THAT IS NOT CONTROLLED WITH YOUR NAUSEA MEDICATION  *UNUSUAL SHORTNESS OF BREATH  *UNUSUAL BRUISING OR BLEEDING  TENDERNESS IN MOUTH AND THROAT WITH OR WITHOUT PRESENCE OF ULCERS  *URINARY PROBLEMS  *BOWEL PROBLEMS  UNUSUAL RASH Items with * indicate a potential emergency and should be followed up as soon as possible.  Feel free to call the clinic should you have any questions or concerns. The clinic phone number is [phone].  Please show the [place] at check-in to the Emergency Department and triage nurse.   Leucovorin injection What is this medicine? LEUCOVORIN (loo koe VOR in) is used to prevent or treat the harmful effects of some medicines. This medicine is used to treat anemia caused by a low amount of folic acid in the body. It is also used with 5-fluorouracil (5-FU) to treat colon cancer. This medicine may be used for other purposes; ask your health care provider or pharmacist if you have questions. What should I tell my health care provider before I take this medicine? They need to know if you have any of these conditions: -anemia from low levels of vitamin B-12 in the blood -an unusual or allergic reaction to leucovorin, folic acid, other medicines, foods, dyes, or preservatives -pregnant or trying to get pregnant -breast-feeding How should I use this medicine? This medicine is for injection into a muscle or into a vein.  It is given by a health care professional in a hospital or clinic setting. Talk to your pediatrician regarding the use of this medicine in children. Special care may be needed. Overdosage: If you think you have taken too much of this medicine contact a poison control center or emergency room at once. NOTE: This medicine is only for you. Do not share this medicine with others. What if I miss a dose? This does not apply. What may interact with this medicine? -capecitabine -fluorouracil -phenobarbital -phenytoin -primidone -trimethoprim-sulfamethoxazole This list may not describe all possible interactions. Give your health care provider a list of all the medicines, herbs, non-prescription drugs, or dietary supplements you use. Also tell them if you smoke, drink alcohol, or use illegal drugs. Some items may interact with your medicine. What should I watch for while using this medicine? Your condition will be monitored carefully while you are receiving this medicine. This medicine may increase the side effects of 5-fluorouracil, 5-FU. Tell your doctor or health care professional if you have diarrhea or mouth sores that do not get better or that get worse. What side effects may I notice from receiving this medicine? Side effects that you should report to your doctor or health care professional as soon as possible: -allergic reactions like skin rash, itching or hives, swelling of the face, lips, or tongue -breathing problems -fever, infection -mouth sores -unusual bleeding or bruising -unusually weak or tired Side effects that usually do not require medical attention (report to your doctor or health care professional if they continue or are bothersome): -constipation or diarrhea -loss of appetite -  nausea, vomiting This list may not describe all possible side effects. Call your doctor for medical advice about side effects. You may report side effects to FDA at 1-800-FDA-1088. Where should I  keep my medicine? This drug is given in a hospital or clinic and will not be stored at home. NOTE: This sheet is a summary. It may not cover all possible information. If you have questions about this medicine, talk to your doctor, pharmacist, or health care provider.  2018 Elsevier/Gold Standard (2007-08-15 16:50:29)   Oxaliplatin Injection What is this medicine? OXALIPLATIN (ox AL i PLA tin) is a chemotherapy drug. It targets fast dividing cells, like cancer cells, and causes these cells to die. This medicine is used to treat cancers of the colon and rectum, and many other cancers. This medicine may be used for other purposes; ask your health care provider or pharmacist if you have questions. COMMON BRAND NAME(S): Eloxatin What should I tell my health care provider before I take this medicine? They need to know if you have any of these conditions: -kidney disease -an unusual or allergic reaction to oxaliplatin, other chemotherapy, other medicines, foods, dyes, or preservatives -pregnant or trying to get pregnant -breast-feeding How should I use this medicine? This drug is given as an infusion into a vein. It is administered in a hospital or clinic by a specially trained health care professional. Talk to your pediatrician regarding the use of this medicine in children. Special care may be needed. Overdosage: If you think you have taken too much of this medicine contact a poison control center or emergency room at once. NOTE: This medicine is only for you. Do not share this medicine with others. What if I miss a dose? It is important not to miss a dose. Call your doctor or health care professional if you are unable to keep an appointment. What may interact with this medicine? -medicines to increase blood counts like filgrastim, pegfilgrastim, sargramostim -probenecid -some antibiotics like amikacin, gentamicin, neomycin, polymyxin B, streptomycin, tobramycin -zalcitabine Talk to your  doctor or health care professional before taking any of these medicines: -acetaminophen -aspirin -ibuprofen -ketoprofen -naproxen This list may not describe all possible interactions. Give your health care provider a list of all the medicines, herbs, non-prescription drugs, or dietary supplements you use. Also tell them if you smoke, drink alcohol, or use illegal drugs. Some items may interact with your medicine. What should I watch for while using this medicine? Your condition will be monitored carefully while you are receiving this medicine. You will need important blood work done while you are taking this medicine. This medicine can make you more sensitive to cold. Do not drink cold drinks or use ice. Cover exposed skin before coming in contact with cold temperatures or cold objects. When out in cold weather wear warm clothing and cover your mouth and nose to warm the air that goes into your lungs. Tell your doctor if you get sensitive to the cold. This drug may make you feel generally unwell. This is not uncommon, as chemotherapy can affect healthy cells as well as cancer cells. Report any side effects. Continue your course of treatment even though you feel ill unless your doctor tells you to stop. In some cases, you may be given additional medicines to help with side effects. Follow all directions for their use. Call your doctor or health care professional for advice if you get a fever, chills or sore throat, or other symptoms of a cold or flu. Do  not treat yourself. This drug decreases your body's ability to fight infections. Try to avoid being around people who are sick. This medicine may increase your risk to bruise or bleed. Call your doctor or health care professional if you notice any unusual bleeding. Be careful brushing and flossing your teeth or using a toothpick because you may get an infection or bleed more easily. If you have any dental work done, tell your dentist you are receiving  this medicine. Avoid taking products that contain aspirin, acetaminophen, ibuprofen, naproxen, or ketoprofen unless instructed by your doctor. These medicines may hide a fever. Do not become pregnant while taking this medicine. Women should inform their doctor if they wish to become pregnant or think they might be pregnant. There is a potential for serious side effects to an unborn child. Talk to your health care professional or pharmacist for more information. Do not breast-feed an infant while taking this medicine. Call your doctor or health care professional if you get diarrhea. Do not treat yourself. What side effects may I notice from receiving this medicine? Side effects that you should report to your doctor or health care professional as soon as possible: -allergic reactions like skin rash, itching or hives, swelling of the face, lips, or tongue -low blood counts - This drug may decrease the number of white blood cells, red blood cells and platelets. You may be at increased risk for infections and bleeding. -signs of infection - fever or chills, cough, sore throat, pain or difficulty passing urine -signs of decreased platelets or bleeding - bruising, pinpoint red spots on the skin, black, tarry stools, nosebleeds -signs of decreased red blood cells - unusually weak or tired, fainting spells, lightheadedness -breathing problems -chest pain, pressure -cough -diarrhea -jaw tightness -mouth sores -nausea and vomiting -pain, swelling, redness or irritation at the injection site -pain, tingling, numbness in the hands or feet -problems with balance, talking, walking -redness, blistering, peeling or loosening of the skin, including inside the mouth -trouble passing urine or change in the amount of urine Side effects that usually do not require medical attention (report to your doctor or health care professional if they continue or are bothersome): -changes in vision -constipation -hair  loss -loss of appetite -metallic taste in the mouth or changes in taste -stomach pain This list may not describe all possible side effects. Call your doctor for medical advice about side effects. You may report side effects to FDA at 1-800-FDA-1088. Where should I keep my medicine? This drug is given in a hospital or clinic and will not be stored at home. NOTE: This sheet is a summary. It may not cover all possible information. If you have questions about this medicine, talk to your doctor, pharmacist, or health care provider.  2018 Elsevier/Gold Standard (2007-09-05 17:22:47)   Fluorouracil, 5-FU injection What is this medicine? FLUOROURACIL, 5-FU (flure oh YOOR a sil) is a chemotherapy drug. It slows the growth of cancer cells. This medicine is used to treat many types of cancer like breast cancer, colon or rectal cancer, pancreatic cancer, and stomach cancer. This medicine may be used for other purposes; ask your health care provider or pharmacist if you have questions. COMMON BRAND NAME(S): Adrucil What should I tell my health care provider before I take this medicine? They need to know if you have any of these conditions: -blood disorders -dihydropyrimidine dehydrogenase (DPD) deficiency -infection (especially a virus infection such as chickenpox, cold sores, or herpes) -kidney disease -liver disease -malnourished, poor nutrition -  recent or ongoing radiation therapy -an unusual or allergic reaction to fluorouracil, other chemotherapy, other medicines, foods, dyes, or preservatives -pregnant or trying to get pregnant -breast-feeding How should I use this medicine? This drug is given as an infusion or injection into a vein. It is administered in a hospital or clinic by a specially trained health care professional. Talk to your pediatrician regarding the use of this medicine in children. Special care may be needed. Overdosage: If you think you have taken too much of this medicine  contact a poison control center or emergency room at once. NOTE: This medicine is only for you. Do not share this medicine with others. What if I miss a dose? It is important not to miss your dose. Call your doctor or health care professional if you are unable to keep an appointment. What may interact with this medicine? -allopurinol -cimetidine -dapsone -digoxin -hydroxyurea -leucovorin -levamisole -medicines for seizures like ethotoin, fosphenytoin, phenytoin -medicines to increase blood counts like filgrastim, pegfilgrastim, sargramostim -medicines that treat or prevent blood clots like warfarin, enoxaparin, and dalteparin -methotrexate -metronidazole -pyrimethamine -some other chemotherapy drugs like busulfan, cisplatin, estramustine, vinblastine -trimethoprim -trimetrexate -vaccines Talk to your doctor or health care professional before taking any of these medicines: -acetaminophen -aspirin -ibuprofen -ketoprofen -naproxen This list may not describe all possible interactions. Give your health care provider a list of all the medicines, herbs, non-prescription drugs, or dietary supplements you use. Also tell them if you smoke, drink alcohol, or use illegal drugs. Some items may interact with your medicine. What should I watch for while using this medicine? Visit your doctor for checks on your progress. This drug may make you feel generally unwell. This is not uncommon, as chemotherapy can affect healthy cells as well as cancer cells. Report any side effects. Continue your course of treatment even though you feel ill unless your doctor tells you to stop. In some cases, you may be given additional medicines to help with side effects. Follow all directions for their use. Call your doctor or health care professional for advice if you get a fever, chills or sore throat, or other symptoms of a cold or flu. Do not treat yourself. This drug decreases your body's ability to fight  infections. Try to avoid being around people who are sick. This medicine may increase your risk to bruise or bleed. Call your doctor or health care professional if you notice any unusual bleeding. Be careful brushing and flossing your teeth or using a toothpick because you may get an infection or bleed more easily. If you have any dental work done, tell your dentist you are receiving this medicine. Avoid taking products that contain aspirin, acetaminophen, ibuprofen, naproxen, or ketoprofen unless instructed by your doctor. These medicines may hide a fever. Do not become pregnant while taking this medicine. Women should inform their doctor if they wish to become pregnant or think they might be pregnant. There is a potential for serious side effects to an unborn child. Talk to your health care professional or pharmacist for more information. Do not breast-feed an infant while taking this medicine. Men should inform their doctor if they wish to father a child. This medicine may lower sperm counts. Do not treat diarrhea with over the counter products. Contact your doctor if you have diarrhea that lasts more than 2 days or if it is severe and watery. This medicine can make you more sensitive to the sun. Keep out of the sun. If you cannot avoid being in  the sun, wear protective clothing and use sunscreen. Do not use sun lamps or tanning beds/booths. What side effects may I notice from receiving this medicine? Side effects that you should report to your doctor or health care professional as soon as possible: -allergic reactions like skin rash, itching or hives, swelling of the face, lips, or tongue -low blood counts - this medicine may decrease the number of white blood cells, red blood cells and platelets. You may be at increased risk for infections and bleeding. -signs of infection - fever or chills, cough, sore throat, pain or difficulty passing urine -signs of decreased platelets or bleeding - bruising,  pinpoint red spots on the skin, black, tarry stools, blood in the urine -signs of decreased red blood cells - unusually weak or tired, fainting spells, lightheadedness -breathing problems -changes in vision -chest pain -mouth sores -nausea and vomiting -pain, swelling, redness at site where injected -pain, tingling, numbness in the hands or feet -redness, swelling, or sores on hands or feet -stomach pain -unusual bleeding Side effects that usually do not require medical attention (report to your doctor or health care professional if they continue or are bothersome): -changes in finger or toe nails -diarrhea -dry or itchy skin -hair loss -headache -loss of appetite -sensitivity of eyes to the light -stomach upset -unusually teary eyes This list may not describe all possible side effects. Call your doctor for medical advice about side effects. You may report side effects to FDA at 1-800-FDA-1088. Where should I keep my medicine? This drug is given in a hospital or clinic and will not be stored at home. NOTE: This sheet is a summary. It may not cover all possible information. If you have questions about this medicine, talk to your doctor, pharmacist, or health care provider.  2018 Elsevier/Gold Standard (2007-06-14 13:53:16)

## 2016-12-29 NOTE — Progress Notes (Signed)
[facility]  [phone] [phone]  Clinic Follow up Note   Patient Care Team: Tower, [name], MD as PCP - General [name], MD as Consulting Physician (Hematology) [name], MD as Attending Physician (Gastroenterology) [name], MD as Consulting Physician (General Surgery) 12/29/2016  SUMMARY OF ONCOLOGIC HISTORY:   Cancer of overlapping sites of colon ([place])   08/31/2016 Tumor Marker    Patient's tumor was tested for the following markers: CEA. Results of the tumor marker test revealed 2.0.      08/31/2016 Pathology Results    Colon biopsy Diagnosis: 1. Surgical [P], hepatic flexure. ADENOCARCINOMA. Moderately differentiated. 2. Surgical [P], transverse. ADENOCARCINOMA, Moderately differentiated.      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  - 2) Clearly malignant mass in the distal transverse colon; 4cm, firm, 1/2 cirumference, ulcerated. ' - 3) Four small (3-80m) scattered typical appearing adenomas throughout the colon, notremoved since she mistakenly stayed on her plavix for this procedure.      09/01/2016 Imaging    CT Abdomen Pelvis W Contrast IMPRESSION: There are 2 colonic lesions involving the proximal ascending colon and proximal transverse colon, as detailed above, highly concerning for colonic neoplasm. In addition, there are at least 2 indeterminate liver lesions which are suspicious for potential metastatic disease.       09/16/2016 Imaging    Mr Liver W Wo Contrast IMPRESSION: Lesion within central right lobe of liver exhibits of peripheral enhancement and is suspicious for liver metastasis. Lesion within caudate lobe of liver identified on recent CT does not have a corresponding signal or enhancement abnormality on today's study. Within the inferior right lobe of liver there is  an enhancing structure which is favored to represent an atypical benign hemangioma with liver metastasis felt less likely. The transverse colon lesion is again identified compatible with colonic adenocarcinoma.      10/04/2016 PET scan    PET 10/04/16 IMPRESSION: 1. Highly hypermetabolic proximal transverse colon mass, maximum SUV 30.8. Small focus of hypermetabolic activity in the mesentery just above this mass probably represents a local involved lymph node. 2. Moderately hypermetabolic ascending colon mass, maximum SUV 8.7. 3. Highly hypermetabolic enlarged portacaval lymph node, maximum SUV 16.7. 4. Focus of hypermetabolic activity near the duodenum bulb could be physiologic or due to an adjacent lymph node. 5. None of the liver lesions are discernibly hypermetabolic. Particularly the more cephalad right hepatic lobe lesion merits surveillance, it had nonspecific enhancement characteristics on prior cross-sectional imaging and only a thin enhancing wall which could conceivably predispose to false negative due to central necrosis. 6. Several tiny chronic pulmonary nodules are likely benign. 7. Other imaging findings of potential clinical significance: Aortic Atherosclerosis (ICD10-I70.0). Coronary atherosclerosis. Bilateral nonobstructive nephrolithiasis.      11/10/2016 Surgery    LAPAROSCOPIC EXTENDED RIGHT COLECTOMY by Dr. [name]      11/10/2016 Pathology Results    Diagnosis 11/10/16 Colon, segmental resection for tumor, Ascending and Transverse ADENOCARCINOMA OF THE ASCENDING COLON WITH EXTRA CELLULAR MUCIN, GRADE 3, SIZE 3.4 CM THE TUMOR INVADES MUSCULARIS PROPRIA POORLY DIFFERENTIATED ADENOCARCINOMA OF THE TRANSVERSE COLON, GRADE 4, SIZE 6.1 CM THE TUMOR INVADES THROUGH MUSCULARIS PROPRIA INTO PERICOLONIC SOFT TISSUE EXTRAMURAL SATELLITE TUMOR NODULES IS IDENTIFIED (X1) ALL MARGINS OF RESECTION ARE NEGATIVE FOR CARCINOMA METASTATIC CARCINOMA IN ONE OF  SIXTY LYMPH NODES  (1/60) TABULAR ADENOMA X1      12/28/2016 Imaging    MRI abdomen w/wo contrast IMPRESSION: Motion degraded images.  Two indeterminate right liver lesions measuring up to 1.6 cm, as described above. Continued attention on follow-up is suggested.  Two periportal lymph nodes measuring up to 1.9 cm, suspicious for nodal metastases.  Postsurgical changes related to right colon resection.      CURRENT THERAPY: Adjuvant FOLFOX to begin 12/29/16  INTERVAL HISTORY: [name] returns for f/u today with her daughter prior to first cycle FOLFOX. She completed keflex for bladder infection, her symptoms are improved; she has urinary urgency and frequency at baseline, but denies dysuria or hematuria. She has soft BM almost daily, with some watery stool. She stopped taking miralax. She had abdominal cramping with one BM yesterday. No blood in stool. Mild dyspnea on exertion, stable since surgery; able to perform normal activities such as caring for her horses and other animals.   REVIEW OF SYSTEMS:   Constitutional: Denies fevers, chills or abnormal weight loss Eyes: Denies blurriness of vision Ears, nose, mouth, throat, and face: Denies mucositis or sore throat Respiratory: Denies cough or wheezes (+) DOE, stable since surgery  Cardiovascular: Denies palpitation, chest discomfort (+) mild ower extremity swelling after being on feet all day  Gastrointestinal:  Denies nausea, vomiting, constipation, heartburn, or blood in stool (+) BM "almost daily" some watery stool (+) cramping with BM (+) midline incision healing well  GU: completed keflex 3 days ago. Denies dysuria or hematuria. (+) frequency and urgency at baseline  Skin: Denies abnormal skin rashes Lymphatics: Denies new lymphadenopathy or easy bruising Neurological:Denies numbness, tingling or new weaknesses Behavioral/Psych: Mood is stable, no new changes  All other systems were reviewed with the patient and are negative.  MEDICAL  HISTORY:  Past Medical History:  Diagnosis Date  . Anemia   . Arthritis   . Cancer ([place])    colon  . Coronary artery disease, non-occlusive   . Dyspnea    more so exertion  . Dysrhythmia    skipped beats every now and then  . GERD (gastroesophageal reflux disease)   . Heel spur   . HSV (herpes simplex virus) infection   . Hyperglycemia   . Hyperlipidemia   . Hypertension   . IBS (irritable bowel syndrome)   . IC (interstitial cystitis)   . Insomnia disorder related to known organic factor   . Osteoporosis   . PONV (postoperative nausea and vomiting)    has had more surgeries, and had no problems  . Stroke ([place])    2018    . Vitamin D deficiency     SURGICAL HISTORY: Past Surgical History:  Procedure Laterality Date  . ABDOMINAL HYSTERECTOMY    . CARDIAC CATHETERIZATION  7/12   non obst dz -- not sure what yr  . CHOLECYSTECTOMY    . EYE SURGERY    . ROTATOR CUFF REPAIR     2 TIMES    I have reviewed the social history and family history with the patient and they are unchanged from previous note.  ALLERGIES:  is allergic to salmon [fish allergy]; ampicillin; codeine; flagyl [metronidazole]; meperidine hcl; neomycin; penicillins; propoxyphene hcl; sulfa antibiotics; and tramadol hcl.  MEDICATIONS:  Current Outpatient Medications  Medication Sig Dispense Refill  . atorvastatin (LIPITOR) 40 MG tablet Take 1 tablet (40 mg total) by mouth daily at 6 PM. 90 tablet 3  . b complex vitamins tablet Take 1 tablet  by mouth daily.    . Ca Carbonate-Mag Hydroxide (ROLAIDS PO) Take 1-2 tablets by mouth every 8 (eight) hours as needed (for acid reflux).     . cephALEXin (KEFLEX) 250 MG capsule Take 1 capsule (250 mg total) by mouth 2 (two) times daily. 10 capsule 0  . cholecalciferol (VITAMIN D) 1000 units tablet Take 2,000 Units by mouth daily.     . COMBIGAN 0.2-0.5 % ophthalmic solution Place 1 drop into both eyes at bedtime.   2  . ELIQUIS STARTER PACK (ELIQUIS STARTER PACK) 5  MG TABS Take as directed on package: start with two-69m tablets twice daily for 7 days. On day 8, switch to one-568mtablet twice daily. 1 each 0  . famotidine (PEPCID) 20 MG tablet Take 1 tablet (20 mg total) by mouth 2 (two) times daily. 60 tablet 0  . gabapentin (NEURONTIN) 100 MG capsule Take 1 capsule (100 mg total) by mouth 2 (two) times daily as needed. (Patient taking differently: Take 100 mg by mouth 2 (two) times daily as needed (for nerve pain). ) 30 capsule 1  . lidocaine-prilocaine (EMLA) cream Apply to affected area once 30 g 3  . LUMIGAN 0.01 % SOLN Place 1 drop into both eyes every morning.     . Multiple Vitamin (MULTIVITAMIN WITH MINERALS) TABS tablet Take 1 tablet by mouth daily.    . ondansetron (ZOFRAN) 8 MG tablet Take 1 tablet (8 mg total) by mouth 2 (two) times daily as needed for refractory nausea / vomiting. Start on day 3 after chemotherapy. 30 tablet 1  . oxyCODONE (OXY IR/ROXICODONE) 5 MG immediate release tablet Take 0.5-1 tablets (2.5-5 mg total) by mouth every 6 (six) hours as needed for moderate pain, severe pain or breakthrough pain. 5 tablet 0  . polyethylene glycol (MIRALAX / GLYCOLAX) packet Take 17 g by mouth daily. 14 each 0  . prochlorperazine (COMPAZINE) 10 MG tablet Take 1 tablet (10 mg total) by mouth every 6 (six) hours as needed (Nausea or vomiting). 30 tablet 1  . valACYclovir (VALTREX) 500 MG tablet TAKE 1 TABLET (500 MG TOTAL) BY MOUTH 2 (TWO) TIMES DAILY. 6 tablet 2   No current facility-administered medications for this visit.    Facility-Administered Medications Ordered in Other Visits  Medication Dose Route Frequency Provider Last Rate Last Dose  . sodium chloride flush (NS) 0.9 % injection 10 mL  10 mL Intravenous PRN FeTruitt MerleMD   10 mL at 12/29/16 1205    PHYSICAL EXAMINATION: ECOG PERFORMANCE STATUS: 1 - Symptomatic but completely ambulatory  Vitals:   12/29/16 1219  BP: (!) 156/71  Pulse: 84  Resp: 18  Temp: 97.9 F (36.6 C)    SpO2: 100%   Filed Weights   12/29/16 1219  Weight: 147 lb (66.7 kg)    GENERAL:alert, no distress and comfortable SKIN: skin color, texture, turgor are normal, no rashes or significant lesions EYES: normal, Conjunctiva are pink and non-injected, sclera clear OROPHARYNX:no exudate, no erythema and lips, buccal mucosa, and tongue normal  NECK: supple, thyroid normal size, non-tender, without nodularity LYMPH:  no palpable cervical or supraclavicular lymphadenopathy LUNGS: clear to auscultation bilaterally with normal breathing effort HEART: regular rate & rhythm and no murmurs, trace lower extremity edema ABDOMEN:abdomen soft, non-tender and normal bowel sounds (+) midline abd incision, well healed  Musculoskeletal:no cyanosis of digits and no clubbing  NEURO: alert & oriented x 3 with fluent speech, no focal motor/sensory deficits PAC without erythema  LABORATORY DATA:  I have reviewed the data as listed CBC Latest Ref Rng & Units 12/29/2016 12/15/2016 11/30/2016  WBC 3.9 - 10.3 10e3/uL 6.3 6.6 6.4  Hemoglobin 11.6 - 15.9 g/dL 9.3 Repeated and Verified(L) 10.6(L) 8.7(L)  Hematocrit 34.8 - 46.6 % 29.2(L) 33.0(L) 28.3(L)  Platelets 145 - 400 10e3/uL 249 369.0 352     CMP Latest Ref Rng & Units 12/29/2016 12/15/2016 11/28/2016  Glucose 70 - 140 mg/dl 113 100(H) 108(H)  BUN 7.0 - 26.0 mg/dL 13.8 25(H) 9  Creatinine 0.6 - 1.1 mg/dL 0.8 0.89 0.97  Sodium 136 - 145 mEq/L 140 138 135  Potassium 3.5 - 5.1 mEq/L 3.7 3.8 3.3(L)  Chloride 96 - 112 mEq/L - 103 97(L)  CO2 22 - 29 mEq/L _0 Calcium 8.4 - 10.4 mg/dL 8.9 9.4 8.4(L)  Total Protein 6.4 - 8.3 g/dL 6.6 - 5.9(L)  Total Bilirubin 0.20 - 1.20 mg/dL 0.43 - 0.6  Alkaline Phos 40 - 150 U/L 73 - 72  AST 5 - 34 U/L 12 - 17  ALT 0 - 55 U/L 11 - 18    RADIOGRAPHIC STUDIES: I have personally reviewed the radiological images as listed and agreed with the findings in the report. Mr Abdomen W Wo Contrast  Result Date:  12/28/2016 CLINICAL DATA:  Metastatic colorectal adenocarcinoma EXAM: MRI ABDOMEN WITHOUT AND WITH CONTRAST TECHNIQUE: Multiplanar multisequence MR imaging of the abdomen was performed both before and after the administration of intravenous contrast. CONTRAST:  42m MULTIHANCE GADOBENATE DIMEGLUMINE 529 MG/ML IV SOLN COMPARISON:  PET-CT dated 10/04/2016.  MRI abdomen dated 09/15/2016. FINDINGS: Motion degraded images. Lower chest: Lung bases are clear. Hepatobiliary: 1.6 cm T2 hyperintense lesion inferiorly in segment 6 (series 4/ image 33), previously 1.4 cm. Additional 9 mm lesion with peripheral rim enhancement in segment 8 (series 1202/ image 48), previously 1.3 cm. Both lesions are grossly unchanged and considered indeterminate. The caudate lesion on CT is not well visualized on MRI. Status post cholecystectomy. No intrahepatic or extrahepatic duct dilatation. Pancreas:  Within normal limits. Spleen:  Within normal limits. Adrenals/Urinary Tract:  Adrenal glands are within normal limits. Kidneys are notable for left renal sinus cysts and right renal cortical cysts, without enhancement. No hydronephrosis. Stomach/Bowel: Stomach is within normal limits. Postsurgical changes related to right colon resection. Vascular/Lymphatic:  No evidence of abdominal aortic aneurysm. Two periportal nodes measuring up to 1.9 cm short axis (series 4/image 26), suspicious for nodal metastases. Other:  No abdominal ascites. Musculoskeletal: No focal osseous lesions. IMPRESSION: Motion degraded images. Two indeterminate right liver lesions measuring up to 1.6 cm, as described above. Continued attention on follow-up is suggested. Two periportal lymph nodes measuring up to 1.9 cm, suspicious for nodal metastases. Postsurgical changes related to right colon resection. Electronically Signed   By: SJulian HyM.D.   On: 12/28/2016 13:23     ASSESSMENT & PLAN: VJailyne [name] is a 76y.o. caucasian female with colorectal  adenocarcinoma, moderately differentiated, found on screening colonoscopy.   1. Synchronized colon adenocarcinoma, moderately differentiated, in hepatic fracture and distal transverse colon, pT3(m)N1cM0, stage IIIB, MSI-H 2. Genetics referral 3. Anemia 4. Liver lesions 5. History of stroke 6. Elevated BP reading 7. Constipation 8. UTI  Yvonne. GCorniaappears stable today, 2 months s/p segmental colon resection and recovered well. BM returning to normal frequency but with some water content. She will continue to hold miralax. Her UTI is resolved. She attended chemo class and had PAC placed without difficulty. Her initial staging CT  showed numerous indeterminate small liver lesions which were not hypermetabolic on PET as well as hypermetabolic enlarged portacaval lymph node. Recent MRI of abdomen revealed no significant change in liver lesions, but noted slightly enlarged periportal lymphadenopathy from previous scan, suspicious for nodal metastasis. [name] is able to do EUS biopsy. This was reviewed with the patient and her daughter, she is agreeable. Will plan to proceed with cycle 1 FOLFOX today; will delay cycle 2 by 1 week to accommodate Thanksgiving holiday and coordinate biopsy. We discussed if this confirms residual disease and surgical excision is not an option, she will likely be on chemotherapy long term rather than expected 3 month duration and she will likely not be curable, she understands. Her tumor is MSI-high, she is agreeable to genetics referral to rule out lynch syndrome, I sent referral today. Her daughter is in her 54's and has already had 3 colonoscopies with polyp removal. We discussed possible chemo side effects and symptom management. She is mildly anemic on CBC today, Hgb 9.3, Iron studies show she is iron deficient likely from colon surgery, serum iron 22 with 7% transferrin saturation. I will arrange for her to get IV Feraheme at next infusion. Cmet and CBC otherwise adequate  for treatment. She will return 01/17/17 for f/u and cycle 2, EUS biopsy later that week  PLAN: -Proceed with cycle 1 FOLFOX today -Flu vaccine today -Genetics referral placed today -Periportal LN EUS biopsy in 1-2 weeks -IV Feraheme at next infusion, order in signed and held -F/u 11/26 and cycle 2   All questions were answered. The patient knows to call the clinic with any problems, questions or concerns. No barriers to learning was detected. I spent 25 minutes counseling the patient face to face. The total time spent in the appointment was 30 minutes and more than 50% was on counseling and review of test results     Alla Feeling, NP 12/29/16   Addendum I have seen the patient, examined her. I agree with the assessment and and plan and have edited the notes.   I reviewed the recent MRI and initial staging PET, CT and MRI scan findings with [name] and her daughter. Her previous periportal nodes have increased in size and was hypermetabolic on PET scan, which are highly suspicious for metastatic disease from her colon cancer.  Her liver lesions are stable overall, not hypermetabolic on previous PET scan, likely benign.  I have discussed with IR Dr. Pascal Lux, he feels EUS biopsy of the periportal node would be higher yield than the liver biopsy.  I have contacted gastroenterologist [name] and he will schedule patient for EUS and node biopsy.  She is agreeable with the plan. We will start chemotherapy FOLFOX today.  Side effects reviewed with patient and her daughter again, questions were answered.  [name]  12/29/2016

## 2016-12-29 NOTE — Telephone Encounter (Signed)
Gave avs and calendar for November and December  °

## 2016-12-29 NOTE — Patient Instructions (Signed)
Implanted Port Home Guide An implanted port is a type of central line that is placed under the skin. Central lines are used to provide IV access when treatment or nutrition needs to be given through a person's veins. Implanted ports are used for long-term IV access. An implanted port may be placed because:  You need IV medicine that would be irritating to the small veins in your hands or arms.  You need long-term IV medicines, such as antibiotics.  You need IV nutrition for a long period.  You need frequent blood draws for lab tests.  You need dialysis.  Implanted ports are usually placed in the chest area, but they can also be placed in the upper arm, the abdomen, or the leg. An implanted port has two main parts:  Reservoir. The reservoir is round and will appear as a small, raised area under your skin. The reservoir is the part where a needle is inserted to give medicines or draw blood.  Catheter. The catheter is a thin, flexible tube that extends from the reservoir. The catheter is placed into a large vein. Medicine that is inserted into the reservoir goes into the catheter and then into the vein.  How will I care for my incision site? Do not get the incision site wet. Bathe or shower as directed by your health care provider. How is my port accessed? Special steps must be taken to access the port:  Before the port is accessed, a numbing cream can be placed on the skin. This helps numb the skin over the port site.  Your health care provider uses a sterile technique to access the port. ? Your health care provider must put on a mask and sterile gloves. ? The skin over your port is cleaned carefully with an antiseptic and allowed to dry. ? The port is gently pinched between sterile gloves, and a needle is inserted into the port.  Only "non-coring" port needles should be used to access the port. Once the port is accessed, a blood return should be checked. This helps ensure that the port  is in the vein and is not clogged.  If your port needs to remain accessed for a constant infusion, a clear (transparent) bandage will be placed over the needle site. The bandage and needle will need to be changed every week, or as directed by your health care provider.  Keep the bandage covering the needle clean and dry. Do not get it wet. Follow your health care provider's instructions on how to take a shower or bath while the port is accessed.  If your port does not need to stay accessed, no bandage is needed over the port.  What is flushing? Flushing helps keep the port from getting clogged. Follow your health care provider's instructions on how and when to flush the port. Ports are usually flushed with saline solution or a medicine called heparin. The need for flushing will depend on how the port is used.  If the port is used for intermittent medicines or blood draws, the port will need to be flushed: ? After medicines have been given. ? After blood has been drawn. ? As part of routine maintenance.  If a constant infusion is running, the port may not need to be flushed.  How long will my port stay implanted? The port can stay in for as long as your health care provider thinks it is needed. When it is time for the port to come out, surgery will be   done to remove it. The procedure is similar to the one performed when the port was put in. When should I seek immediate medical care? When you have an implanted port, you should seek immediate medical care if:  You notice a bad smell coming from the incision site.  You have swelling, redness, or drainage at the incision site.  You have more swelling or pain at the port site or the surrounding area.  You have a fever that is not controlled with medicine.  This information is not intended to replace advice given to you by your health care provider. Make sure you discuss any questions you have with your health care provider. Document  Released: 02/08/2005 Document Revised: 07/17/2015 Document Reviewed: 10/16/2012 Elsevier Interactive Patient Education  2017 [employer]  

## 2016-12-29 NOTE — Telephone Encounter (Signed)
-----   Message from [name], MD sent at 12/29/2016  3:32 PM EST ----- OK, we'll get her in.  Jema Deegan, She needs upper EUS, radial +/- linear, Mac sedation, for colon cancer and abdominal adenopathy.  Next available.  dj  ----- Message ----- From: [name], MD Sent: 12/29/2016   2:34 PM To: [name], MD, [name], MD, #  Dan, thanks much. Even surgery is not an option, I would like to confirm if she has residual node metastasis. So please go ahead schedule the EUS and biopsy. I have spoken with her about this and she is agreeable  Thanks   Krista Blue  ----- Message ----- From: [name], MD Sent: 12/29/2016   1:35 PM To: [name], MD, Alla Feeling, NP, #  Krista Blue, I should be able to sample those nodes.  Would you only want me to go ahead with that if [name] were planning to resect them since she is getting same chemo either way?  DJ   ----- Message ----- From: [name], MD Sent: 12/29/2016  12:10 PM To: [name], MD, [name], MD, #  Linna Hoff,  This lady had hypermetabolic periportal nodes on her initial staging CT/MRI/PET scans for colon cancer, and small liver lesions were negative on PET. Dorris Fetch did her colon surgery, but I do not think she sampled the periportal nodes based on her op note.   I repeated her MRI lately which showed stable liver lesions, but periportal nodes are slightly larger (from 1.5cm to 1.9cm). I suspect she has node metastasis. I spoke with IR Ronny Bacon, he suggest you try EUS. Could you review and let me know if that's feasible?  She is starting chemo today, I plan to proceed, her chemo will be the same as adjuvant vs for metastatic disease.   Dorris Fetch, could you consider surgery again if those nodes are positive?   Thanks much  [employer]

## 2016-12-30 ENCOUNTER — Telehealth: Payer: Self-pay | Admitting: Hematology

## 2016-12-30 ENCOUNTER — Telehealth: Payer: Self-pay

## 2016-12-30 DIAGNOSIS — K589 Irritable bowel syndrome without diarrhea: Secondary | ICD-10-CM | POA: Diagnosis not present

## 2016-12-30 DIAGNOSIS — E559 Vitamin D deficiency, unspecified: Secondary | ICD-10-CM | POA: Diagnosis not present

## 2016-12-30 DIAGNOSIS — I10 Essential (primary) hypertension: Secondary | ICD-10-CM | POA: Diagnosis not present

## 2016-12-30 DIAGNOSIS — I82422 Acute embolism and thrombosis of left iliac vein: Secondary | ICD-10-CM | POA: Diagnosis not present

## 2016-12-30 DIAGNOSIS — D509 Iron deficiency anemia, unspecified: Secondary | ICD-10-CM | POA: Diagnosis not present

## 2016-12-30 DIAGNOSIS — M810 Age-related osteoporosis without current pathological fracture: Secondary | ICD-10-CM | POA: Diagnosis not present

## 2016-12-30 DIAGNOSIS — Z483 Aftercare following surgery for neoplasm: Secondary | ICD-10-CM | POA: Diagnosis not present

## 2016-12-30 DIAGNOSIS — I2510 Atherosclerotic heart disease of native coronary artery without angina pectoris: Secondary | ICD-10-CM | POA: Diagnosis not present

## 2016-12-30 DIAGNOSIS — G4700 Insomnia, unspecified: Secondary | ICD-10-CM | POA: Diagnosis not present

## 2016-12-30 DIAGNOSIS — C188 Malignant neoplasm of overlapping sites of colon: Secondary | ICD-10-CM | POA: Diagnosis not present

## 2016-12-30 NOTE — Telephone Encounter (Signed)
Scheduled appt per 11/7 sch message - patient to get updated schedule next visit .

## 2016-12-30 NOTE — Telephone Encounter (Signed)
Pt had nausea med and pepcid about 6 this am. She had PT this morning. Temp was 89.0. She had a few chills after PT.  When this RN called back the chills have gone away.  Her husband keeps the house cool. She had put her light coat on. Explained that if she gets chills that do not go away with a sweater or her temp >100.5 to call.

## 2016-12-31 ENCOUNTER — Ambulatory Visit (HOSPITAL_BASED_OUTPATIENT_CLINIC_OR_DEPARTMENT_OTHER): Payer: Medicare HMO

## 2016-12-31 VITALS — BP 150/77 | HR 85 | Temp 98.5°F | Resp 20

## 2016-12-31 DIAGNOSIS — C188 Malignant neoplasm of overlapping sites of colon: Secondary | ICD-10-CM | POA: Diagnosis not present

## 2016-12-31 MED ORDER — HEPARIN SOD (PORK) LOCK FLUSH 100 UNIT/ML IV SOLN
500.0000 [IU] | Freq: Once | INTRAVENOUS | Status: AC | PRN
  Administered 2016-12-31: 500 [IU]
  Filled 2016-12-31: qty 5

## 2016-12-31 MED ORDER — SODIUM CHLORIDE 0.9% FLUSH
10.0000 mL | INTRAVENOUS | Status: DC | PRN
  Administered 2016-12-31: 10 mL
  Filled 2016-12-31: qty 10

## 2016-12-31 NOTE — Patient Instructions (Signed)
Implanted Port Home Guide An implanted port is a type of central line that is placed under the skin. Central lines are used to provide IV access when treatment or nutrition needs to be given through a person's veins. Implanted ports are used for long-term IV access. An implanted port may be placed because:  You need IV medicine that would be irritating to the small veins in your hands or arms.  You need long-term IV medicines, such as antibiotics.  You need IV nutrition for a long period.  You need frequent blood draws for lab tests.  You need dialysis.  Implanted ports are usually placed in the chest area, but they can also be placed in the upper arm, the abdomen, or the leg. An implanted port has two main parts:  Reservoir. The reservoir is round and will appear as a small, raised area under your skin. The reservoir is the part where a needle is inserted to give medicines or draw blood.  Catheter. The catheter is a thin, flexible tube that extends from the reservoir. The catheter is placed into a large vein. Medicine that is inserted into the reservoir goes into the catheter and then into the vein.  How will I care for my incision site? Do not get the incision site wet. Bathe or shower as directed by your health care provider. How is my port accessed? Special steps must be taken to access the port:  Before the port is accessed, a numbing cream can be placed on the skin. This helps numb the skin over the port site.  Your health care provider uses a sterile technique to access the port. ? Your health care provider must put on a mask and sterile gloves. ? The skin over your port is cleaned carefully with an antiseptic and allowed to dry. ? The port is gently pinched between sterile gloves, and a needle is inserted into the port.  Only "non-coring" port needles should be used to access the port. Once the port is accessed, a blood return should be checked. This helps ensure that the port  is in the vein and is not clogged.  If your port needs to remain accessed for a constant infusion, a clear (transparent) bandage will be placed over the needle site. The bandage and needle will need to be changed every week, or as directed by your health care provider.  Keep the bandage covering the needle clean and dry. Do not get it wet. Follow your health care provider's instructions on how to take a shower or bath while the port is accessed.  If your port does not need to stay accessed, no bandage is needed over the port.  What is flushing? Flushing helps keep the port from getting clogged. Follow your health care provider's instructions on how and when to flush the port. Ports are usually flushed with saline solution or a medicine called heparin. The need for flushing will depend on how the port is used.  If the port is used for intermittent medicines or blood draws, the port will need to be flushed: ? After medicines have been given. ? After blood has been drawn. ? As part of routine maintenance.  If a constant infusion is running, the port may not need to be flushed.  How long will my port stay implanted? The port can stay in for as long as your health care provider thinks it is needed. When it is time for the port to come out, surgery will be   done to remove it. The procedure is similar to the one performed when the port was put in. When should I seek immediate medical care? When you have an implanted port, you should seek immediate medical care if:  You notice a bad smell coming from the incision site.  You have swelling, redness, or drainage at the incision site.  You have more swelling or pain at the port site or the surrounding area.  You have a fever that is not controlled with medicine.  This information is not intended to replace advice given to you by your health care provider. Make sure you discuss any questions you have with your health care provider. Document  Released: 02/08/2005 Document Revised: 07/17/2015 Document Reviewed: 10/16/2012 Elsevier Interactive Patient Education  2017 [employer]  

## 2017-01-03 ENCOUNTER — Other Ambulatory Visit: Payer: Self-pay

## 2017-01-03 DIAGNOSIS — E559 Vitamin D deficiency, unspecified: Secondary | ICD-10-CM | POA: Diagnosis not present

## 2017-01-03 DIAGNOSIS — R591 Generalized enlarged lymph nodes: Secondary | ICD-10-CM

## 2017-01-03 DIAGNOSIS — I82422 Acute embolism and thrombosis of left iliac vein: Secondary | ICD-10-CM | POA: Diagnosis not present

## 2017-01-03 DIAGNOSIS — R599 Enlarged lymph nodes, unspecified: Secondary | ICD-10-CM

## 2017-01-03 DIAGNOSIS — I2510 Atherosclerotic heart disease of native coronary artery without angina pectoris: Secondary | ICD-10-CM | POA: Diagnosis not present

## 2017-01-03 DIAGNOSIS — C188 Malignant neoplasm of overlapping sites of colon: Secondary | ICD-10-CM | POA: Diagnosis not present

## 2017-01-03 DIAGNOSIS — M810 Age-related osteoporosis without current pathological fracture: Secondary | ICD-10-CM | POA: Diagnosis not present

## 2017-01-03 DIAGNOSIS — D509 Iron deficiency anemia, unspecified: Secondary | ICD-10-CM | POA: Diagnosis not present

## 2017-01-03 DIAGNOSIS — G4700 Insomnia, unspecified: Secondary | ICD-10-CM | POA: Diagnosis not present

## 2017-01-03 DIAGNOSIS — I10 Essential (primary) hypertension: Secondary | ICD-10-CM | POA: Diagnosis not present

## 2017-01-03 DIAGNOSIS — K589 Irritable bowel syndrome without diarrhea: Secondary | ICD-10-CM | POA: Diagnosis not present

## 2017-01-03 DIAGNOSIS — Z483 Aftercare following surgery for neoplasm: Secondary | ICD-10-CM | POA: Diagnosis not present

## 2017-01-03 NOTE — Telephone Encounter (Signed)
EUS scheduled, pt instructed and medications reviewed.  Patient instructions mailed to home.  Patient to call with any questions or concerns.  

## 2017-01-03 NOTE — Telephone Encounter (Signed)
Yes, eliquis will need to be held for 2 days prior.  Will need prescribing MD ok about it.

## 2017-01-03 NOTE — Telephone Encounter (Signed)
Dr [name] the pt is on Eliquis, does she need to hold? She has been scheduled for 02/03/17.

## 2017-01-05 DIAGNOSIS — M810 Age-related osteoporosis without current pathological fracture: Secondary | ICD-10-CM | POA: Diagnosis not present

## 2017-01-05 DIAGNOSIS — E559 Vitamin D deficiency, unspecified: Secondary | ICD-10-CM | POA: Diagnosis not present

## 2017-01-05 DIAGNOSIS — D509 Iron deficiency anemia, unspecified: Secondary | ICD-10-CM | POA: Diagnosis not present

## 2017-01-05 DIAGNOSIS — I10 Essential (primary) hypertension: Secondary | ICD-10-CM | POA: Diagnosis not present

## 2017-01-05 DIAGNOSIS — I82422 Acute embolism and thrombosis of left iliac vein: Secondary | ICD-10-CM | POA: Diagnosis not present

## 2017-01-05 DIAGNOSIS — Z483 Aftercare following surgery for neoplasm: Secondary | ICD-10-CM | POA: Diagnosis not present

## 2017-01-05 DIAGNOSIS — I2510 Atherosclerotic heart disease of native coronary artery without angina pectoris: Secondary | ICD-10-CM | POA: Diagnosis not present

## 2017-01-05 DIAGNOSIS — G4700 Insomnia, unspecified: Secondary | ICD-10-CM | POA: Diagnosis not present

## 2017-01-05 DIAGNOSIS — C188 Malignant neoplasm of overlapping sites of colon: Secondary | ICD-10-CM | POA: Diagnosis not present

## 2017-01-05 DIAGNOSIS — K589 Irritable bowel syndrome without diarrhea: Secondary | ICD-10-CM | POA: Diagnosis not present

## 2017-01-05 NOTE — Telephone Encounter (Signed)
Tower, [name], MD  to Larina Bras, CMA     5:20 PM  Note    She had a small stroke over a year ago with no re occurrence .  Ok to hold plavix briefly as planned for procedure       The pt has been advised to hold eliquis 2 days prior to procedure.

## 2017-01-06 ENCOUNTER — Ambulatory Visit: Payer: Medicare HMO | Admitting: Hematology

## 2017-01-06 ENCOUNTER — Other Ambulatory Visit: Payer: Medicare HMO

## 2017-01-06 ENCOUNTER — Ambulatory Visit: Payer: Medicare HMO

## 2017-01-10 ENCOUNTER — Telehealth: Payer: Self-pay

## 2017-01-10 NOTE — Telephone Encounter (Signed)
Pt lvm about BM that was partial diarrhea and blood in her BM. LVM when called back. LVM with call back.   She had seen a Market researcher on 11/16. [facility] [place]. Yesterday pt was at grocery store and had to rush to bathroom. She had diarrhea and pink in water. It has not happened again. She feels a little weak. She has had no BM since. She is not in pain. Her appetite is fairly good. No nausea.   She had oxaliplatin, leucovorin and 42fu on 11/7.   Instructed her to watch for increased blood in stool. Increased diarrhea. Increased weakness. Abdominal pain.

## 2017-01-10 NOTE — Telephone Encounter (Signed)
I agree with Kathy's assessment and plan, thanks   [name] MD

## 2017-01-11 ENCOUNTER — Other Ambulatory Visit: Payer: Self-pay | Admitting: Surgery

## 2017-01-11 ENCOUNTER — Other Ambulatory Visit: Payer: Self-pay | Admitting: Family Medicine

## 2017-01-11 NOTE — Telephone Encounter (Signed)
Copied from [place] [id number]. Topic: Inquiry >> Jan 11, 2017  4:53 PM [name] Pila B wrote: Reason for CRM: PT called and is asking if she can get a refill on her ELIQUIS, she does not know if she needs an appt for it but she is aware that [name] is out of the office for a while and is needing someone to advise her about this, contact pt if needed or have pharmacy contact pt when Rx is ready

## 2017-01-11 NOTE — Telephone Encounter (Signed)
Please verify she is taking eliquis 5 mg bid (after she finished starter pack)  Please call this in #[phone] refills  Let me know if any issues or problems with it

## 2017-01-12 ENCOUNTER — Telehealth: Payer: Self-pay | Admitting: *Deleted

## 2017-01-12 ENCOUNTER — Other Ambulatory Visit: Payer: Self-pay | Admitting: Hematology

## 2017-01-12 ENCOUNTER — Ambulatory Visit: Payer: Medicare HMO | Admitting: Hematology

## 2017-01-12 ENCOUNTER — Ambulatory Visit: Payer: Medicare HMO

## 2017-01-12 ENCOUNTER — Other Ambulatory Visit: Payer: Medicare HMO

## 2017-01-12 MED ORDER — APIXABAN 5 MG PO TABS: 5.0000 mg | ORAL_TABLET | Freq: Two times a day (BID) | ORAL | 2 refills | Status: DC

## 2017-01-12 NOTE — Progress Notes (Signed)
[name]  [phone] [phone] [name]   Patient Care Team: Tower, [name], MD as PCP - General [name], MD as Consulting Physician (Hematology) [name], MD as Attending Physician (Gastroenterology) [name], MD as Consulting Physician (General Surgery) 01/17/2017   CHIEF COMPLAINTS:  Follow up for Colorectal adenocarcinoma, moderately differentiated    Cancer of overlapping sites of colon ([name])   08/31/2016 Tumor Marker    Patient's tumor was tested for the following markers: CEA. Results of the tumor marker test revealed 2.0.      08/31/2016 Pathology Results    Colon biopsy Diagnosis: 1. Surgical [P], hepatic flexure. ADENOCARCINOMA. Moderately differentiated. 2. Surgical [P], transverse. ADENOCARCINOMA, Moderately differentiated.      08/31/2016 Initial Diagnosis    Colon cancer metastasized to liver ([name])      08/31/2016 Procedure    - 1) Polypoid mass at the hepatic flexure, 2cm, soft but clearly neoplastic.  - 2) Clearly malignant mass in the distal transverse colon; 4cm, firm, 1/2 cirumference, ulcerated. ' - 3) Four small (3-89m) scattered typical appearing adenomas throughout the colon, notremoved since she mistakenly stayed on her plavix for this procedure.      09/01/2016 Imaging    CT Abdomen Pelvis W Contrast IMPRESSION: There are 2 colonic lesions involving the proximal ascending colon and proximal transverse colon, as detailed above, highly concerning for colonic neoplasm. In addition, there are at least 2 indeterminate liver lesions which are suspicious for potential metastatic disease.       09/16/2016 Imaging    Mr Liver W Wo Contrast IMPRESSION: Lesion within central right lobe of liver exhibits of peripheral enhancement and is suspicious for liver metastasis. Lesion within caudate lobe of liver identified on recent CT does not have a corresponding signal or enhancement abnormality on  today's study. Within the inferior right lobe of liver there is an enhancing structure which is favored to represent an atypical benign hemangioma with liver metastasis felt less likely. The transverse colon lesion is again identified compatible with colonic adenocarcinoma.      10/04/2016 PET scan    PET 10/04/16 IMPRESSION: 1. Highly hypermetabolic proximal transverse colon mass, maximum SUV 30.8. Small focus of hypermetabolic activity in the mesentery just above this mass probably represents a local involved lymph node. 2. Moderately hypermetabolic ascending colon mass, maximum SUV 8.7. 3. Highly hypermetabolic enlarged portacaval lymph node, maximum SUV 16.7. 4. Focus of hypermetabolic activity near the duodenum bulb could be physiologic or due to an adjacent lymph node. 5. None of the liver lesions are discernibly hypermetabolic. Particularly the more cephalad right hepatic lobe lesion merits surveillance, it had nonspecific enhancement characteristics on prior cross-sectional imaging and only a thin enhancing wall which could conceivably predispose to false negative due to central necrosis. 6. Several tiny chronic pulmonary nodules are likely benign. 7. Other imaging findings of potential clinical significance: Aortic Atherosclerosis (ICD10-I70.0). Coronary atherosclerosis. Bilateral nonobstructive nephrolithiasis.      11/10/2016 Surgery    LAPAROSCOPIC EXTENDED RIGHT COLECTOMY by Dr. [name]      11/10/2016 Pathology Results    Diagnosis 11/10/16 Colon, segmental resection for tumor, Ascending and Transverse ADENOCARCINOMA OF THE ASCENDING COLON WITH EXTRA CELLULAR MUCIN, GRADE 3, SIZE 3.4 CM THE TUMOR INVADES MUSCULARIS PROPRIA POORLY DIFFERENTIATED ADENOCARCINOMA OF THE TRANSVERSE COLON, GRADE 4, SIZE 6.1 CM THE TUMOR INVADES THROUGH MUSCULARIS PROPRIA INTO PERICOLONIC SOFT TISSUE EXTRAMURAL SATELLITE TUMOR NODULES IS IDENTIFIED (X1) ALL MARGINS OF RESECTION ARE NEGATIVE  FOR CARCINOMA METASTATIC CARCINOMA IN ONE OF SIXTY LYMPH NODES (1/60) TABULAR ADENOMA X1      12/28/2016 Imaging    MRI abdomen w/wo contrast IMPRESSION: Motion degraded images.  Two indeterminate right liver lesions measuring up to 1.6 cm, as described above. Continued attention on follow-up is suggested.  Two periportal lymph nodes measuring up to 1.9 cm, suspicious for nodal metastases.  Postsurgical changes related to right colon resection.       12/29/2016 -  Chemotherapy    FOLFOX every 2 weeks starting 12/29/16        HISTORY OF PRESENTING ILLNESS: 09/23/16 [name] 76 y.o. female is here because of Colorectal adenocarcinoma, moderately differentiated  Referred by Gastroenterologist, [name] for abnormal colon biopsy. Accompanied by family friend.   Pt initially presented with one episode of rectal bleeding one year ago, but did not pursue further workup until lately, she was referred to Gastroenterologist, [name] on 07/13/16 for Routine colonoscopy. She was referred to him by [name] for repeat colonoscopy for colon CA screening. Pt last colonoscopy was with [name] in August 2011 that was completed for hx of polyps and was significant for hemorrhoids and otherwise negative.   On 08/31/16, pt underwent screening colonoscopy, which unfortunately showed 2 masses in the proximal and distant transverse colon, biopsy both showed moderately differentiated adenocarcinoma.   Pt had a CT Abdomen Pelvis that was ordered by [name] and completed on 09/01/16 and significant for colonic neoplasm and 2 indeterminate liver lesions suspicious for potential metastatic dx.   Pt had a CT Chest W Constrast that was ordered by [name] and completed on 09/06/16 and significant for no metastasis.    Pt had MR Liver W WO Contrast that was ordered by [name] and completed on 09/16/16 and significant for colonic  adenocarcinoma and indeterminate liver lesions.  Pt notes that she was referred to the office by [name]. Pt has an appointment with Surgeon, [name] tomorrow, 8/3 to discuss her options. She notes that [name] recommended a liver biopsy. Pt states that she had 1 year ago she had one large episode of rectal bleeding following consuming a milkshake, 1 episode of emesis, and a large episode of rectal bleeding that resolved on its own. Denies recurrent bleeding following initial rectal bleeding. Denies being evaluated last year for her rectal bleeding due to having issues with finding a GI specialist. Pt last colonoscopy was 7 years ago. Pt reports that due to her chronic constipation, she uses suppositories and OTC laxatives. She states that she eats mainly meat and starches due to her dislike for vegetables. She notes that she recently lost approximately 10 lbs over the past year. 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 She states that she lives with her husband and has 3 older children, 2 in [place] and 1 in [place]. Pt friend notes that the pt grandchildren intermittently help her out. She states that her husband has severe medical issues with internal bleeding and is currently on oxygen. Pt reports that she completes all of the housework and outside work due to her husband being unable to complete daily activities.   Pt has intermittent GERD that she used to take Nexium for. Denies PMHx of HTN, cardiac issues. Denies taking HTN medications. Denies MI, but notes that she was evaluated  for CP symptoms and was found to have a 20% blockage in her arteries. Pt reports that she had a stroke with resolved residual right sided weakness summer 2017 that she was evaluated the next day at [facility] and was informed by her horse vet to be evaluated. Pt states that she took 2 ASA following her initial symptoms. Pt  reports that she takes ASA and plavix for. She states that she stopped taking her plavix 2 weeks ago following the colonoscopy. Pt sates that she takes Rx Lipitor for cholesterol. Pt reports that she takes multi-vitamin and that [name] recommended that she takes vitamin-D.   Pt had cholecystectomy and partial hysterectomy with one ovary left. Pt denies family hx of CA. Denies smoking cigarettes, but reports that her husband used to smoke cigarettes. Pt denies ETOH use at this time.   On review of systems, pt denies fever, chills. Endorses 10 lb weight loss, decreased energy levels. Denies pain. Pt reports constipation treated with laxatives. Pt reports that she has hemorrhoids. Pt denies abdominal pain, nausea, vomiting, abdominal distension.    CURRENT THERAPY:  FOLFOX every 2 weeks starting 12/29/16   INTERVAL HISTORY:  [name] is here for a follow up and cycle 2 FOLFOX. She presents to the clinic today by herself.  She tolerated the first cycle chemotherapy well overall, did have fatigue, low appetite for 3-5 days, no significant nausea, diarrhea, or other issues.  She has recovered well overall, although not complete.  She denies pain or other new symptoms.  She gained 3 pounds in the past 2 weeks.    MEDICAL HISTORY:  Past Medical History:  Diagnosis Date  . Anemia   . Arthritis   . Cancer ([place])    colon  . Coronary artery disease, non-occlusive   . Dyspnea    more so exertion  . Dysrhythmia    skipped beats every now and then  . GERD (gastroesophageal reflux disease)   . Heel spur   . HSV (herpes simplex virus) infection   . Hyperglycemia   . Hyperlipidemia   . Hypertension   . IBS (irritable bowel syndrome)   . IC (interstitial cystitis)   . Insomnia disorder related to known organic factor   . Osteoporosis   . PONV (postoperative nausea and vomiting)    has had more surgeries, and had no problems  . Stroke ([place])    2018    . Vitamin D deficiency      SURGICAL HISTORY: Past Surgical History:  Procedure Laterality Date  . ABDOMINAL HYSTERECTOMY    . CARDIAC CATHETERIZATION  7/12   non obst dz -- not sure what yr  . CHOLECYSTECTOMY    . EYE SURGERY    . LAPAROSCOPIC RIGHT COLECTOMY Right 11/10/2016   Procedure: LAPAROSCOPIC EXTENDED RIGHT COLECTOMY;  Surgeon: [name], MD;  Location: [facility];  Service: General;  Laterality: Right;  . PORTACATH PLACEMENT Left 12/22/2016   Procedure: INSERTION PORT-A-CATH;  Surgeon: [name], MD;  Location: [facility];  Service: General;  Laterality: Left;  . ROTATOR CUFF REPAIR     2 TIMES    SOCIAL HISTORY: Social History   Socioeconomic History  . Marital status: Married    Spouse name: Not on file  . Number of children: 4  . Years of education: Not on file  . Highest education level: Not on file  Social Needs  . Financial resource strain: Not on file  . Food insecurity - worry: Not  on file  . Food insecurity - inability: Not on file  . Transportation needs - medical: Not on file  . Transportation needs - non-medical: Not on file  Occupational History  . Occupation: reitred    Employer: RETIRED  Tobacco Use  . Smoking status: Never Smoker  . Smokeless tobacco: Never Used  Substance and Sexual Activity  . Alcohol use: No    Alcohol/week: 0.0 oz    Comment: rare  . Drug use: No  . Sexual activity: Not on file  Other Topics Concern  . Not on file  Social History Narrative   Rides horses         Specialty providers    [facility]   Cardio- [facility]   Ortho--[place]   Rheum-[facility]   urol-[facility]   [facility]-- [facility]    FAMILY HISTORY: Family History  Problem Relation Age of Onset  . Alcohol abuse Mother   . Heart attack Mother   . GER disease Mother   . GER disease Father   . Alcohol abuse Father   . Stroke Father   . Tuberculosis Father   . Heart attack Father   . Aneurysm Father   . Mental illness Sister   . Depression Sister   .  Breast cancer Neg Hx     ALLERGIES:  is allergic to salmon [fish allergy]; ampicillin; codeine; flagyl [metronidazole]; meperidine hcl; neomycin; penicillins; propoxyphene hcl; sulfa antibiotics; and tramadol hcl.  MEDICATIONS:  Current Outpatient Medications  Medication Sig Dispense Refill  . apixaban (ELIQUIS) 5 MG TABS tablet Take 1 tablet (5 mg total) by mouth 2 (two) times daily. 60 tablet 2  . atorvastatin (LIPITOR) 40 MG tablet Take 1 tablet (40 mg total) by mouth daily at 6 PM. 90 tablet 3  . b complex vitamins tablet Take 1 tablet by mouth daily.    . Ca Carbonate-Mag Hydroxide (ROLAIDS PO) Take 1-2 tablets by mouth every 8 (eight) hours as needed (for acid reflux).     . cholecalciferol (VITAMIN D) 1000 units tablet Take 2,000 Units by mouth daily.     . famotidine (PEPCID) 20 MG tablet Take 1 tablet (20 mg total) by mouth 2 (two) times daily. 60 tablet 0  . gabapentin (NEURONTIN) 100 MG capsule Take 1 capsule (100 mg total) by mouth 2 (two) times daily as needed. (Patient taking differently: Take 100 mg by mouth 2 (two) times daily as needed (for nerve pain). ) 30 capsule 1  . lidocaine-prilocaine (EMLA) cream Apply to affected area once 30 g 3  . Multiple Vitamin (MULTIVITAMIN WITH MINERALS) TABS tablet Take 1 tablet by mouth daily.    . ondansetron (ZOFRAN) 8 MG tablet Take 1 tablet (8 mg total) by mouth 2 (two) times daily as needed for refractory nausea / vomiting. Start on day 3 after chemotherapy. 30 tablet 1  . prochlorperazine (COMPAZINE) 10 MG tablet Take 1 tablet (10 mg total) by mouth every 6 (six) hours as needed (Nausea or vomiting). 30 tablet 1  . valACYclovir (VALTREX) 500 MG tablet TAKE 1 TABLET (500 MG TOTAL) BY MOUTH 2 (TWO) TIMES DAILY. 6 tablet 2  . COMBIGAN 0.2-0.5 % ophthalmic solution Place 1 drop into both eyes at bedtime.   2  . LUMIGAN 0.01 % SOLN Place 1 drop into both eyes every morning.     Marland Kitchen oxyCODONE (OXY IR/ROXICODONE) 5 MG immediate release tablet  Take 0.5-1 tablets (2.5-5 mg total) by mouth every 6 (six) hours as needed for moderate pain, severe  pain or breakthrough pain. (Patient not taking: Reported on 01/17/2017) 5 tablet 0  . polyethylene glycol (MIRALAX / GLYCOLAX) packet Take 17 g by mouth daily. (Patient not taking: Reported on 01/17/2017) 14 each 0   No current facility-administered medications for this visit.     REVIEW OF SYSTEMS:  Constitutional: Denies fevers, chills or abnormal night sweats (+) low appetite and fatigue  Eyes: Denies blurriness of vision, double vision or watery eyes Ears, nose, mouth, throat, and face: Denies mucositis or sore throat Respiratory: Denies cough, dyspnea or wheezes Cardiovascular: Denies palpitation, chest discomfort or lower extremity swelling Gastrointestinal:  Denies nausea, heartburn (+) constipation  Skin: Denies abnormal skin rashes Lymphatics: Denies new lymphadenopathy or easy bruising Neurological:Denies numbness, tingling or new weaknesses Behavioral/Psych: Mood is stable, no new changes  All other systems were reviewed with the patient and are negative.  PHYSICAL EXAMINATION: ECOG PERFORMANCE STATUS: 1  Vitals:   01/17/17 1154  BP: (!) 147/58  Pulse: 87  Resp: 20  Temp: 98.2 F (36.8 C)  SpO2: 100%   Filed Weights   01/17/17 1154  Weight: 150 lb 1.6 oz (68.1 kg)   GENERAL:alert, no distress and comfortable SKIN: skin color, texture, turgor are normal, no rashes or significant lesions EYES: normal, conjunctiva are pink and non-injected, sclera clear OROPHARYNX:no exudate, no erythema and lips, buccal mucosa, and tongue normal  NECK: supple, thyroid normal size, non-tender, without nodularity LYMPH:  no palpable lymphadenopathy in the cervical, axillary or inguinal LUNGS: clear to auscultation and percussion with normal breathing effort HEART: regular rate & rhythm and no murmurs and no lower extremity edema ABDOMEN:abdomen soft, non-tender and normal bowel  sounds. No organomegaly noted. (+) midline incision above umbilical , well healed, non-tender, no scar tissue.  Musculoskeletal:no cyanosis of digits and no clubbing  PSYCH: alert & oriented x 3 with fluent speech NEURO: no focal motor/sensory deficits   LABORATORY DATA:  I have reviewed the data as listed CBC Latest Ref Rng & Units 01/17/2017 12/29/2016 12/15/2016  WBC 3.9 - 10.3 10e3/uL 4.0 6.3 6.6  Hemoglobin 11.6 - 15.9 g/dL 10.0(L) 9.3 Repeated and Verified(L) 10.6(L)  Hematocrit 34.8 - 46.6 % 32.6(L) 29.2(L) 33.0(L)  Platelets 145 - 400 10e3/uL 290 249 369.0    CMP Latest Ref Rng & Units 01/17/2017 12/29/2016 12/15/2016  Glucose 70 - 140 mg/dl 118 113 100(H)  BUN 7.0 - 26.0 mg/dL 13.6 13.8 25(H)  Creatinine 0.6 - 1.1 mg/dL 1.0 0.8 0.89  Sodium 136 - 145 mEq/L 141 140 138  Potassium 3.5 - 5.1 mEq/L 4.0 3.7 3.8  Chloride 96 - 112 mEq/L - - 103  CO2 22 - 29 mEq/L '24 23 28  ' Calcium 8.4 - 10.4 mg/dL 9.2 8.9 9.4  Total Protein 6.4 - 8.3 g/dL 6.7 6.6 -  Total Bilirubin 0.20 - 1.20 mg/dL 0.41 0.43 -  Alkaline Phos 40 - 150 U/L 88 73 -  AST 5 - 34 U/L 16 12 -  ALT 0 - 55 U/L 15 11 -    CEA 08/31/16: 2.0   PATHOLOGY REPORT:    Diagnosis 11/10/16 Colon, segmental resection for tumor, Ascending and Transverse ADENOCARCINOMA OF THE ASCENDING COLON WITH EXTRA CELLULAR MUCIN, GRADE 3, SIZE 3.4 CM THE TUMOR INVADES MUSCULARIS PROPRIA POORLY DIFFERENTIATED ADENOCARCINOMA OF THE TRANSVERSE COLON, GRADE 4, SIZE 6.1 CM THE TUMOR INVADES THROUGH MUSCULARIS PROPRIA INTO PERICOLONIC SOFT TISSUE EXTRAMURAL SATELLITE TUMOR NODULES IS IDENTIFIED (X1) ALL MARGINS OF RESECTION ARE NEGATIVE FOR CARCINOMA METASTATIC CARCINOMA IN ONE OF  SIXTY LYMPH NODES (1/60) TABULAR ADENOMA X1 Microscopic Comment COLON AND RECTUM (INCLUDING TRANS-ANAL RESECTION): Specimen: Right and transverse colon Procedure: Segmental resection Tumor site: Ascending and transverse colon Specimen integrity:  Intact Macroscopic intactness of mesorectum: Not applicable: X Complete: NA Near complete: NA Incomplete: NA Cannot be determined (specify): NA Macroscopic tumor perforation: Pericolonic soft tissue Invasive tumor: Maximum size: 6.1 cm and 3.4 cm Histologic type(s): Adenocarcinoma Histologic grade and differentiation: G1: well differentiated/low grade Microscopic Comment(continued) G2: moderately differentiated/low grade G3: poorly differentiated/high grade G4: undifferentiated/high grade Type of polyp in which invasive carcinoma arose: Tubular adenoma Microscopic extension of invasive tumor: Pericolonic soft tissue Lymph-Vascular invasion: Identified Peri-neural invasion: Negative Tumor deposit(s) (discontinuous extramural extension): Present Resection margins: Proximal margin: Negative Distal margin: Negative Circumferential (radial) (posterior ascending, posterior descending; lateral and posterior mid-rectum; and entire lower 1/3 rectum): Negative Mesenteric margin (sigmoid and transverse): Negative Distance closest margin (if all above margins negative): 1.5 cm from the mesenteric margin Trans-anal resection margins only: Deep margin: NA Mucosal Margin: NA Distance closest mucosal margin (if negative): NA Treatment effect (neo-adjuvant therapy): Negative Additional polyp(s): Tubular adenoma Non-neoplastic findings: Unremarkble Lymph nodes: number examined 60; number positive: 1 Pathologic Staging: pT3 (m), pN1c, pMx Ancillary studies: Ordered    Colon biopsy, 08/31/16 Diagnosis 1. Surgical [P], hepatic flexure - ADENOCARCINOMA. Moderately differentiated.  2. Surgical [P], transverse - ADENOCARCINOMA, Moderately differentiated.  RADIOGRAPHIC STUDIES: I have personally reviewed the radiological images as listed and agreed with the findings in the report. Mr Abdomen W Wo Contrast  Result Date: 12/28/2016 CLINICAL DATA:  Metastatic colorectal adenocarcinoma EXAM:  MRI ABDOMEN WITHOUT AND WITH CONTRAST TECHNIQUE: Multiplanar multisequence MR imaging of the abdomen was performed both before and after the administration of intravenous contrast. CONTRAST:  34m MULTIHANCE GADOBENATE DIMEGLUMINE 529 MG/ML IV SOLN COMPARISON:  PET-CT dated 10/04/2016.  MRI abdomen dated 09/15/2016. FINDINGS: Motion degraded images. Lower chest: Lung bases are clear. Hepatobiliary: 1.6 cm T2 hyperintense lesion inferiorly in segment 6 (series 4/ image 33), previously 1.4 cm. Additional 9 mm lesion with peripheral rim enhancement in segment 8 (series 1202/ image 48), previously 1.3 cm. Both lesions are grossly unchanged and considered indeterminate. The caudate lesion on CT is not well visualized on MRI. Status post cholecystectomy. No intrahepatic or extrahepatic duct dilatation. Pancreas:  Within normal limits. Spleen:  Within normal limits. Adrenals/Urinary Tract:  Adrenal glands are within normal limits. Kidneys are notable for left renal sinus cysts and right renal cortical cysts, without enhancement. No hydronephrosis. Stomach/Bowel: Stomach is within normal limits. Postsurgical changes related to right colon resection. Vascular/Lymphatic:  No evidence of abdominal aortic aneurysm. Two periportal nodes measuring up to 1.9 cm short axis (series 4/image 26), suspicious for nodal metastases. Other:  No abdominal ascites. Musculoskeletal: No focal osseous lesions. IMPRESSION: Motion degraded images. Two indeterminate right liver lesions measuring up to 1.6 cm, as described above. Continued attention on follow-up is suggested. Two periportal lymph nodes measuring up to 1.9 cm, suspicious for nodal metastases. Postsurgical changes related to right colon resection. Electronically Signed   By: SJulian HyM.D.   On: 12/28/2016 13:23   Dg Chest Port 1 View  Result Date: 12/22/2016 CLINICAL DATA:  Status post Port-A-Cath placement. EXAM: PORTABLE CHEST 1 VIEW COMPARISON:  Radiograph of September 11, 2010. FINDINGS: Stable cardiomediastinal silhouette. Interval placement of left subclavian Port-A-Cath with distal tip in expected position of SVC. No pulmonary consolidation is noted. No pleural effusion is noted. Curvilinear density is seen overlying left lung apex ;  is uncertain if this represents minimal left apical pneumothorax or overlying soft tissue artifact. Bony thorax is unremarkable. IMPRESSION: Interval placement of left subclavian Port-A-Cath with distal tip in expected position of SVC. Curvilinear density seen overlying left lung apex semi it is uncertain if this represents minimal left apical pneumothorax or overlying soft tissue artifact. Followup radiographs are recommended. These results will be called to the ordering clinician or representative by the Radiologist Assistant, and communication documented in the PACS or zVision Dashboard. Electronically Signed   By: Marijo Conception, M.D.   On: 12/22/2016 16:16   Dg Fluoro Guide Cv Line-no Report  Result Date: 12/22/2016 Fluoroscopy was utilized by the requesting physician.  No radiographic interpretation.    PROCEDURES   COLONOSCOPY 08/31/2016 DR. JACOBS  - 1) Polypoid mass at the hepatic flexure, 2cm, soft but clearly neoplastic. This was biopsied and the site was injected with Spot. - 2) Clearly malignant mass in the distal transverse colon; 4cm, firm, 1/2 cirumference, ulcerated. This wsa biopsied and the site was also injected with Spot. - 3) Four small (3-37m) scattered typical appearing adenomas throughout the colon, not removed since she mistakenly stayed on her plavix for this procedure.  ASSESSMENT & PLAN:   VKaryssa Amaral is a 76y.o. caucasian female with colorectal adenocarcinoma, moderately differentiated, found on screening colonoscopy.   1. Synchronized colon adenocarcinoma, moderately differentiated, in hepatic fracture and distal transverse colon, pT3(m)N1cMx, at least stage IIIB, MSI-H -I have  reviewed her colonoscopy, biopsy and CT and MRI image findings with patient and her friend in great details. -She has to synchronized colon mass in the hepatic flexure and distal transverse colon, bost biopsy showed moderately-differentiated adenocarcinoma -CT and abdominal MRI showed 2 lesions in right lobe of liver, at least one lesion is very suspicious for metastatic disease, and enlarged periportal lymph nodes.  --We discussed 10/04/16 PET. There is no definitive distant metastasis on the PET scan. There was no uptake in her liver, liver metastasis is less likely, although not ruled out. There is a hypermetabolic lymph node in the portacaval region measuring 1.5 cm with SUV 16.7, compatible with malignancy. --12/28/16 MRI of abdomen revealed no significant change in liver lesions, but noted slightly enlarged periportal lymphadenopathy from previous scan, suspicious for nodal metastasis. 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 Due to her high risk of recurrence, I recommend her to consider adjuvant chemotherapy.  --She started FOLFOX on 12/29/16, if biopsy returns of metastatic disease she will need chemo longer than the planed 3 months.   2. 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 I recommend she sees a gDietitianto rule out lynch syndrome. She agreed to see gentics  -She has a genetic appointment Feb 01, 2017  3. 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 Liver lesions -Her initial staging CT scan showed numerous small liver lesions, indeterminate, those are not hypermetabolic on PET scan. -I recommend a liver MRI in the next few weeks for further evaluation  and follow-up.  5. History of stroke  -No residual neuro deficits. She functions well at home. -she is on aspirin and Plavix   6. ? HTN -her BP high in clinic previously, pt states it's been normal at home -will monitor   Plan:  -Lab reviewed, adequate for treatment, will proceed with second cycle of FOLFOX today, due to the mild neutropenia, I will omit 5-FU bolus -Due to the upcoming EUS and a biopsy, I will postpone her next cycle chemo to 3 weeks -I will see her back in 3 weeks  No orders of the defined types were placed in this encounter.   All questions were answered. The patient knows to call the clinic with any problems, questions or concerns.  I spent 25 minutes counseling the patient face to face. The total time spent in the appointment was 30 minutes and more than 50% was on counseling.    [name], MD 01/17/2017

## 2017-01-12 NOTE — H&P (View-Only) (Signed)
[place]  [phone] Fax:([phone] [facility]   Patient Care Team: Tower, [name], MD as PCP - General [name], MD as Consulting Physician (Hematology) [name], MD as Attending Physician (Gastroenterology) [name], MD as Consulting Physician (General Surgery) 01/17/2017   CHIEF COMPLAINTS:  Follow up for Colorectal adenocarcinoma, moderately differentiated    Cancer of overlapping sites of colon ([place])   08/31/2016 Tumor Marker    Patient's tumor was tested for the following markers: CEA. Results of the tumor marker test revealed 2.0.      08/31/2016 Pathology Results    Colon biopsy Diagnosis: 1. Surgical [P], hepatic flexure. ADENOCARCINOMA. Moderately differentiated. 2. Surgical [P], transverse. ADENOCARCINOMA, Moderately differentiated.      08/31/2016 Initial Diagnosis    Colon cancer metastasized to liver ([place])      08/31/2016 Procedure    - 1) Polypoid mass at the hepatic flexure, 2cm, soft but clearly neoplastic.  - 2) Clearly malignant mass in the distal transverse colon; 4cm, firm, 1/2 cirumference, ulcerated. ' - 3) Four small (3-22m) scattered typical appearing adenomas throughout the colon, notremoved since she mistakenly stayed on her plavix for this procedure.      09/01/2016 Imaging    CT Abdomen Pelvis W Contrast IMPRESSION: There are 2 colonic lesions involving the proximal ascending colon and proximal transverse colon, as detailed above, highly concerning for colonic neoplasm. In addition, there are at least 2 indeterminate liver lesions which are suspicious for potential metastatic disease.       09/16/2016 Imaging    Mr Liver W Wo Contrast IMPRESSION: Lesion within central right lobe of liver exhibits of peripheral enhancement and is suspicious for liver metastasis. Lesion within caudate lobe of liver identified on recent CT does not have a corresponding signal or enhancement abnormality on  today's study. Within the inferior right lobe of liver there is an enhancing structure which is favored to represent an atypical benign hemangioma with liver metastasis felt less likely. The transverse colon lesion is again identified compatible with colonic adenocarcinoma.      10/04/2016 PET scan    PET 10/04/16 IMPRESSION: 1. Highly hypermetabolic proximal transverse colon mass, maximum SUV 30.8. Small focus of hypermetabolic activity in the mesentery just above this mass probably represents a local involved lymph node. 2. Moderately hypermetabolic ascending colon mass, maximum SUV 8.7. 3. Highly hypermetabolic enlarged portacaval lymph node, maximum SUV 16.7. 4. Focus of hypermetabolic activity near the duodenum bulb could be physiologic or due to an adjacent lymph node. 5. None of the liver lesions are discernibly hypermetabolic. Particularly the more cephalad right hepatic lobe lesion merits surveillance, it had nonspecific enhancement characteristics on prior cross-sectional imaging and only a thin enhancing wall which could conceivably predispose to false negative due to central necrosis. 6. Several tiny chronic pulmonary nodules are likely benign. 7. Other imaging findings of potential clinical significance: Aortic Atherosclerosis (ICD10-I70.0). Coronary atherosclerosis. Bilateral nonobstructive nephrolithiasis.      11/10/2016 Surgery    LAPAROSCOPIC EXTENDED RIGHT COLECTOMY by Dr. [name]      11/10/2016 Pathology Results    Diagnosis 11/10/16 Colon, segmental resection for tumor, Ascending and Transverse ADENOCARCINOMA OF THE ASCENDING COLON WITH EXTRA CELLULAR MUCIN, GRADE 3, SIZE 3.4 CM THE TUMOR INVADES MUSCULARIS PROPRIA POORLY DIFFERENTIATED ADENOCARCINOMA OF THE TRANSVERSE COLON, GRADE 4, SIZE 6.1 CM THE TUMOR INVADES THROUGH MUSCULARIS PROPRIA INTO PERICOLONIC SOFT TISSUE EXTRAMURAL SATELLITE TUMOR NODULES IS IDENTIFIED (X1) ALL MARGINS OF RESECTION ARE NEGATIVE  FOR CARCINOMA METASTATIC CARCINOMA IN ONE OF SIXTY LYMPH NODES (1/60) TABULAR ADENOMA X1      12/28/2016 Imaging    MRI abdomen w/wo contrast IMPRESSION: Motion degraded images.  Two indeterminate right liver lesions measuring up to 1.6 cm, as described above. Continued attention on follow-up is suggested.  Two periportal lymph nodes measuring up to 1.9 cm, suspicious for nodal metastases.  Postsurgical changes related to right colon resection.       12/29/2016 -  Chemotherapy    FOLFOX every 2 weeks starting 12/29/16        HISTORY OF PRESENTING ILLNESS: 09/23/16 [name] 76 y.o. female is here because of Colorectal adenocarcinoma, moderately differentiated  Referred by Gastroenterologist, [name] for abnormal colon biopsy. Accompanied by family friend.   Pt initially presented with one episode of rectal bleeding one year ago, but did not pursue further workup until lately, she was referred to Gastroenterologist, [name] on 07/13/16 for Routine colonoscopy. She was referred to him by [name] for repeat colonoscopy for colon CA screening. Pt last colonoscopy was with [name] in August 2011 that was completed for hx of polyps and was significant for hemorrhoids and otherwise negative.   On 08/31/16, pt underwent screening colonoscopy, which unfortunately showed 2 masses in the proximal and distant transverse colon, biopsy both showed moderately differentiated adenocarcinoma.   Pt had a CT Abdomen Pelvis that was ordered by [name] and completed on 09/01/16 and significant for colonic neoplasm and 2 indeterminate liver lesions suspicious for potential metastatic dx.   Pt had a CT Chest W Constrast that was ordered by [name] and completed on 09/06/16 and significant for no metastasis.    Pt had MR Liver W WO Contrast that was ordered by [name] and completed on 09/16/16 and significant for colonic  adenocarcinoma and indeterminate liver lesions.  Pt notes that she was referred to the office by [name]. Pt has an appointment with Surgeon, [name] tomorrow, 8/3 to discuss her options. She notes that [name] recommended a liver biopsy. Pt states that she had 1 year ago she had one large episode of rectal bleeding following consuming a milkshake, 1 episode of emesis, and a large episode of rectal bleeding that resolved on its own. Denies recurrent bleeding following initial rectal bleeding. Denies being evaluated last year for her rectal bleeding due to having issues with finding a GI specialist. Pt last colonoscopy was 7 years ago. Pt reports that due to her chronic constipation, she uses suppositories and OTC laxatives. She states that she eats mainly meat and starches due to her dislike for vegetables. She notes that she recently lost approximately 10 lbs over the past year. 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 She states that she lives with her husband and has 3 older children, 2 in [place] and 1 in [place]. Pt friend notes that the pt grandchildren intermittently help her out. She states that her husband has severe medical issues with internal bleeding and is currently on oxygen. Pt reports that she completes all of the housework and outside work due to her husband being unable to complete daily activities.   Pt has intermittent GERD that she used to take Nexium for. Denies PMHx of HTN, cardiac issues. Denies taking HTN medications. Denies MI, but notes that she was evaluated  for CP symptoms and was found to have a 20% blockage in her arteries. Pt reports that she had a stroke with resolved residual right sided weakness summer 2017 that she was evaluated the next day at [facility] and was informed by her horse vet to be evaluated. Pt states that she took 2 ASA following her initial symptoms. Pt  reports that she takes ASA and plavix for. She states that she stopped taking her plavix 2 weeks ago following the colonoscopy. Pt sates that she takes Rx Lipitor for cholesterol. Pt reports that she takes multi-vitamin and that [name] recommended that she takes vitamin-D.   Pt had cholecystectomy and partial hysterectomy with one ovary left. Pt denies family hx of CA. Denies smoking cigarettes, but reports that her husband used to smoke cigarettes. Pt denies ETOH use at this time.   On review of systems, pt denies fever, chills. Endorses 10 lb weight loss, decreased energy levels. Denies pain. Pt reports constipation treated with laxatives. Pt reports that she has hemorrhoids. Pt denies abdominal pain, nausea, vomiting, abdominal distension.    CURRENT THERAPY:  FOLFOX every 2 weeks starting 12/29/16   INTERVAL HISTORY:  [name] is here for a follow up and cycle 2 FOLFOX. She presents to the clinic today by herself.  She tolerated the first cycle chemotherapy well overall, did have fatigue, low appetite for 3-5 days, no significant nausea, diarrhea, or other issues.  She has recovered well overall, although not complete.  She denies pain or other new symptoms.  She gained 3 pounds in the past 2 weeks.    MEDICAL HISTORY:  Past Medical History:  Diagnosis Date  . Anemia   . Arthritis   . Cancer ([place])    colon  . Coronary artery disease, non-occlusive   . Dyspnea    more so exertion  . Dysrhythmia    skipped beats every now and then  . GERD (gastroesophageal reflux disease)   . Heel spur   . HSV (herpes simplex virus) infection   . Hyperglycemia   . Hyperlipidemia   . Hypertension   . IBS (irritable bowel syndrome)   . IC (interstitial cystitis)   . Insomnia disorder related to known organic factor   . Osteoporosis   . PONV (postoperative nausea and vomiting)    has had more surgeries, and had no problems  . Stroke ([place])    2018    . Vitamin D deficiency      SURGICAL HISTORY: Past Surgical History:  Procedure Laterality Date  . ABDOMINAL HYSTERECTOMY    . CARDIAC CATHETERIZATION  7/12   non obst dz -- not sure what yr  . CHOLECYSTECTOMY    . EYE SURGERY    . LAPAROSCOPIC RIGHT COLECTOMY Right 11/10/2016   Procedure: LAPAROSCOPIC EXTENDED RIGHT COLECTOMY;  Surgeon: [name], MD;  Location: [facility];  Service: General;  Laterality: Right;  . PORTACATH PLACEMENT Left 12/22/2016   Procedure: INSERTION PORT-A-CATH;  Surgeon: [name], MD;  Location: [facility];  Service: General;  Laterality: Left;  . ROTATOR CUFF REPAIR     2 TIMES    SOCIAL HISTORY: Social History   Socioeconomic History  . Marital status: Married    Spouse name: Not on file  . Number of children: 4  . Years of education: Not on file  . Highest education level: Not on file  Social Needs  . Financial resource strain: Not on file  . Food insecurity - worry: Not  on file  . Food insecurity - inability: Not on file  . Transportation needs - medical: Not on file  . Transportation needs - non-medical: Not on file  Occupational History  . Occupation: reitred    Employer: RETIRED  Tobacco Use  . Smoking status: Never Smoker  . Smokeless tobacco: Never Used  Substance and Sexual Activity  . Alcohol use: No    Alcohol/week: 0.0 oz    Comment: rare  . Drug use: No  . Sexual activity: Not on file  Other Topics Concern  . Not on file  Social History Narrative   Rides horses         Specialty providers    [facility]   Cardio- [facility]   Ortho--[place]   Rheum-[facility]   urol-[facility]   [facility]-- [facility]    FAMILY HISTORY: Family History  Problem Relation Age of Onset  . Alcohol abuse Mother   . Heart attack Mother   . GER disease Mother   . GER disease Father   . Alcohol abuse Father   . Stroke Father   . Tuberculosis Father   . Heart attack Father   . Aneurysm Father   . Mental illness Sister   . Depression Sister   .  Breast cancer Neg Hx     ALLERGIES:  is allergic to salmon [fish allergy]; ampicillin; codeine; flagyl [metronidazole]; meperidine hcl; neomycin; penicillins; propoxyphene hcl; sulfa antibiotics; and tramadol hcl.  MEDICATIONS:  Current Outpatient Medications  Medication Sig Dispense Refill  . apixaban (ELIQUIS) 5 MG TABS tablet Take 1 tablet (5 mg total) by mouth 2 (two) times daily. 60 tablet 2  . atorvastatin (LIPITOR) 40 MG tablet Take 1 tablet (40 mg total) by mouth daily at 6 PM. 90 tablet 3  . b complex vitamins tablet Take 1 tablet by mouth daily.    . Ca Carbonate-Mag Hydroxide (ROLAIDS PO) Take 1-2 tablets by mouth every 8 (eight) hours as needed (for acid reflux).     . cholecalciferol (VITAMIN D) 1000 units tablet Take 2,000 Units by mouth daily.     . famotidine (PEPCID) 20 MG tablet Take 1 tablet (20 mg total) by mouth 2 (two) times daily. 60 tablet 0  . gabapentin (NEURONTIN) 100 MG capsule Take 1 capsule (100 mg total) by mouth 2 (two) times daily as needed. (Patient taking differently: Take 100 mg by mouth 2 (two) times daily as needed (for nerve pain). ) 30 capsule 1  . lidocaine-prilocaine (EMLA) cream Apply to affected area once 30 g 3  . Multiple Vitamin (MULTIVITAMIN WITH MINERALS) TABS tablet Take 1 tablet by mouth daily.    . ondansetron (ZOFRAN) 8 MG tablet Take 1 tablet (8 mg total) by mouth 2 (two) times daily as needed for refractory nausea / vomiting. Start on day 3 after chemotherapy. 30 tablet 1  . prochlorperazine (COMPAZINE) 10 MG tablet Take 1 tablet (10 mg total) by mouth every 6 (six) hours as needed (Nausea or vomiting). 30 tablet 1  . valACYclovir (VALTREX) 500 MG tablet TAKE 1 TABLET (500 MG TOTAL) BY MOUTH 2 (TWO) TIMES DAILY. 6 tablet 2  . COMBIGAN 0.2-0.5 % ophthalmic solution Place 1 drop into both eyes at bedtime.   2  . LUMIGAN 0.01 % SOLN Place 1 drop into both eyes every morning.     Marland Kitchen oxyCODONE (OXY IR/ROXICODONE) 5 MG immediate release tablet  Take 0.5-1 tablets (2.5-5 mg total) by mouth every 6 (six) hours as needed for moderate pain, severe  pain or breakthrough pain. (Patient not taking: Reported on 01/17/2017) 5 tablet 0  . polyethylene glycol (MIRALAX / GLYCOLAX) packet Take 17 g by mouth daily. (Patient not taking: Reported on 01/17/2017) 14 each 0   No current facility-administered medications for this visit.     REVIEW OF SYSTEMS:  Constitutional: Denies fevers, chills or abnormal night sweats (+) low appetite and fatigue  Eyes: Denies blurriness of vision, double vision or watery eyes Ears, nose, mouth, throat, and face: Denies mucositis or sore throat Respiratory: Denies cough, dyspnea or wheezes Cardiovascular: Denies palpitation, chest discomfort or lower extremity swelling Gastrointestinal:  Denies nausea, heartburn (+) constipation  Skin: Denies abnormal skin rashes Lymphatics: Denies new lymphadenopathy or easy bruising Neurological:Denies numbness, tingling or new weaknesses Behavioral/Psych: Mood is stable, no new changes  All other systems were reviewed with the patient and are negative.  PHYSICAL EXAMINATION: ECOG PERFORMANCE STATUS: 1  Vitals:   01/17/17 1154  BP: (!) 147/58  Pulse: 87  Resp: 20  Temp: 98.2 F (36.8 C)  SpO2: 100%   Filed Weights   01/17/17 1154  Weight: 150 lb 1.6 oz (68.1 kg)   GENERAL:alert, no distress and comfortable SKIN: skin color, texture, turgor are normal, no rashes or significant lesions EYES: normal, conjunctiva are pink and non-injected, sclera clear OROPHARYNX:no exudate, no erythema and lips, buccal mucosa, and tongue normal  NECK: supple, thyroid normal size, non-tender, without nodularity LYMPH:  no palpable lymphadenopathy in the cervical, axillary or inguinal LUNGS: clear to auscultation and percussion with normal breathing effort HEART: regular rate & rhythm and no murmurs and no lower extremity edema ABDOMEN:abdomen soft, non-tender and normal bowel  sounds. No organomegaly noted. (+) midline incision above umbilical , well healed, non-tender, no scar tissue.  Musculoskeletal:no cyanosis of digits and no clubbing  PSYCH: alert & oriented x 3 with fluent speech NEURO: no focal motor/sensory deficits   LABORATORY DATA:  I have reviewed the data as listed CBC Latest Ref Rng & Units 01/17/2017 12/29/2016 12/15/2016  WBC 3.9 - 10.3 10e3/uL 4.0 6.3 6.6  Hemoglobin 11.6 - 15.9 g/dL 10.0(L) 9.3 Repeated and Verified(L) 10.6(L)  Hematocrit 34.8 - 46.6 % 32.6(L) 29.2(L) 33.0(L)  Platelets 145 - 400 10e3/uL 290 249 369.0    CMP Latest Ref Rng & Units 01/17/2017 12/29/2016 12/15/2016  Glucose 70 - 140 mg/dl 118 113 100(H)  BUN 7.0 - 26.0 mg/dL 13.6 13.8 25(H)  Creatinine 0.6 - 1.1 mg/dL 1.0 0.8 0.89  Sodium 136 - 145 mEq/L 141 140 138  Potassium 3.5 - 5.1 mEq/L 4.0 3.7 3.8  Chloride 96 - 112 mEq/L - - 103  CO2 22 - 29 mEq/L '24 23 28  ' Calcium 8.4 - 10.4 mg/dL 9.2 8.9 9.4  Total Protein 6.4 - 8.3 g/dL 6.7 6.6 -  Total Bilirubin 0.20 - 1.20 mg/dL 0.41 0.43 -  Alkaline Phos 40 - 150 U/L 88 73 -  AST 5 - 34 U/L 16 12 -  ALT 0 - 55 U/L 15 11 -    CEA 08/31/16: 2.0   PATHOLOGY REPORT:    Diagnosis 11/10/16 Colon, segmental resection for tumor, Ascending and Transverse ADENOCARCINOMA OF THE ASCENDING COLON WITH EXTRA CELLULAR MUCIN, GRADE 3, SIZE 3.4 CM THE TUMOR INVADES MUSCULARIS PROPRIA POORLY DIFFERENTIATED ADENOCARCINOMA OF THE TRANSVERSE COLON, GRADE 4, SIZE 6.1 CM THE TUMOR INVADES THROUGH MUSCULARIS PROPRIA INTO PERICOLONIC SOFT TISSUE EXTRAMURAL SATELLITE TUMOR NODULES IS IDENTIFIED (X1) ALL MARGINS OF RESECTION ARE NEGATIVE FOR CARCINOMA METASTATIC CARCINOMA IN ONE OF  SIXTY LYMPH NODES (1/60) TABULAR ADENOMA X1 Microscopic Comment COLON AND RECTUM (INCLUDING TRANS-ANAL RESECTION): Specimen: Right and transverse colon Procedure: Segmental resection Tumor site: Ascending and transverse colon Specimen integrity:  Intact Macroscopic intactness of mesorectum: Not applicable: X Complete: NA Near complete: NA Incomplete: NA Cannot be determined (specify): NA Macroscopic tumor perforation: Pericolonic soft tissue Invasive tumor: Maximum size: 6.1 cm and 3.4 cm Histologic type(s): Adenocarcinoma Histologic grade and differentiation: G1: well differentiated/low grade Microscopic Comment(continued) G2: moderately differentiated/low grade G3: poorly differentiated/high grade G4: undifferentiated/high grade Type of polyp in which invasive carcinoma arose: Tubular adenoma Microscopic extension of invasive tumor: Pericolonic soft tissue Lymph-Vascular invasion: Identified Peri-neural invasion: Negative Tumor deposit(s) (discontinuous extramural extension): Present Resection margins: Proximal margin: Negative Distal margin: Negative Circumferential (radial) (posterior ascending, posterior descending; lateral and posterior mid-rectum; and entire lower 1/3 rectum): Negative Mesenteric margin (sigmoid and transverse): Negative Distance closest margin (if all above margins negative): 1.5 cm from the mesenteric margin Trans-anal resection margins only: Deep margin: NA Mucosal Margin: NA Distance closest mucosal margin (if negative): NA Treatment effect (neo-adjuvant therapy): Negative Additional polyp(s): Tubular adenoma Non-neoplastic findings: Unremarkble Lymph nodes: number examined 60; number positive: 1 Pathologic Staging: pT3 (m), pN1c, pMx Ancillary studies: Ordered    Colon biopsy, 08/31/16 Diagnosis 1. Surgical [P], hepatic flexure - ADENOCARCINOMA. Moderately differentiated.  2. Surgical [P], transverse - ADENOCARCINOMA, Moderately differentiated.  RADIOGRAPHIC STUDIES: I have personally reviewed the radiological images as listed and agreed with the findings in the report. Mr Abdomen W Wo Contrast  Result Date: 12/28/2016 CLINICAL DATA:  Metastatic colorectal adenocarcinoma EXAM:  MRI ABDOMEN WITHOUT AND WITH CONTRAST TECHNIQUE: Multiplanar multisequence MR imaging of the abdomen was performed both before and after the administration of intravenous contrast. CONTRAST:  26m MULTIHANCE GADOBENATE DIMEGLUMINE 529 MG/ML IV SOLN COMPARISON:  PET-CT dated 10/04/2016.  MRI abdomen dated 09/15/2016. FINDINGS: Motion degraded images. Lower chest: Lung bases are clear. Hepatobiliary: 1.6 cm T2 hyperintense lesion inferiorly in segment 6 (series 4/ image 33), previously 1.4 cm. Additional 9 mm lesion with peripheral rim enhancement in segment 8 (series 1202/ image 48), previously 1.3 cm. Both lesions are grossly unchanged and considered indeterminate. The caudate lesion on CT is not well visualized on MRI. Status post cholecystectomy. No intrahepatic or extrahepatic duct dilatation. Pancreas:  Within normal limits. Spleen:  Within normal limits. Adrenals/Urinary Tract:  Adrenal glands are within normal limits. Kidneys are notable for left renal sinus cysts and right renal cortical cysts, without enhancement. No hydronephrosis. Stomach/Bowel: Stomach is within normal limits. Postsurgical changes related to right colon resection. Vascular/Lymphatic:  No evidence of abdominal aortic aneurysm. Two periportal nodes measuring up to 1.9 cm short axis (series 4/image 26), suspicious for nodal metastases. Other:  No abdominal ascites. Musculoskeletal: No focal osseous lesions. IMPRESSION: Motion degraded images. Two indeterminate right liver lesions measuring up to 1.6 cm, as described above. Continued attention on follow-up is suggested. Two periportal lymph nodes measuring up to 1.9 cm, suspicious for nodal metastases. Postsurgical changes related to right colon resection. Electronically Signed   By: SJulian HyM.D.   On: 12/28/2016 13:23   Dg Chest Port 1 View  Result Date: 12/22/2016 CLINICAL DATA:  Status post Port-A-Cath placement. EXAM: PORTABLE CHEST 1 VIEW COMPARISON:  Radiograph of September 11, 2010. FINDINGS: Stable cardiomediastinal silhouette. Interval placement of left subclavian Port-A-Cath with distal tip in expected position of SVC. No pulmonary consolidation is noted. No pleural effusion is noted. Curvilinear density is seen overlying left lung apex ;  is uncertain if this represents minimal left apical pneumothorax or overlying soft tissue artifact. Bony thorax is unremarkable. IMPRESSION: Interval placement of left subclavian Port-A-Cath with distal tip in expected position of SVC. Curvilinear density seen overlying left lung apex semi it is uncertain if this represents minimal left apical pneumothorax or overlying soft tissue artifact. Followup radiographs are recommended. These results will be called to the ordering clinician or representative by the Radiologist Assistant, and communication documented in the PACS or zVision Dashboard. Electronically Signed   By: Marijo Conception, M.D.   On: 12/22/2016 16:16   Dg Fluoro Guide Cv Line-no Report  Result Date: 12/22/2016 Fluoroscopy was utilized by the requesting physician.  No radiographic interpretation.    PROCEDURES   COLONOSCOPY 08/31/2016 DR. JACOBS  - 1) Polypoid mass at the hepatic flexure, 2cm, soft but clearly neoplastic. This was biopsied and the site was injected with Spot. - 2) Clearly malignant mass in the distal transverse colon; 4cm, firm, 1/2 cirumference, ulcerated. This wsa biopsied and the site was also injected with Spot. - 3) Four small (3-36m) scattered typical appearing adenomas throughout the colon, not removed since she mistakenly stayed on her plavix for this procedure.  ASSESSMENT & PLAN:   VAtia Haupt is a 76y.o. caucasian female with colorectal adenocarcinoma, moderately differentiated, found on screening colonoscopy.   1. Synchronized colon adenocarcinoma, moderately differentiated, in hepatic fracture and distal transverse colon, pT3(m)N1cMx, at least stage IIIB, MSI-H -I have  reviewed her colonoscopy, biopsy and CT and MRI image findings with patient and her friend in great details. -She has to synchronized colon mass in the hepatic flexure and distal transverse colon, bost biopsy showed moderately-differentiated adenocarcinoma -CT and abdominal MRI showed 2 lesions in right lobe of liver, at least one lesion is very suspicious for metastatic disease, and enlarged periportal lymph nodes.  --We discussed 10/04/16 PET. There is no definitive distant metastasis on the PET scan. There was no uptake in her liver, liver metastasis is less likely, although not ruled out. There is a hypermetabolic lymph node in the portacaval region measuring 1.5 cm with SUV 16.7, compatible with malignancy. --12/28/16 MRI of abdomen revealed no significant change in liver lesions, but noted slightly enlarged periportal lymphadenopathy from previous scan, suspicious for nodal metastasis. 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 Due to her high risk of recurrence, I recommend her to consider adjuvant chemotherapy.  --She started FOLFOX on 12/29/16, if biopsy returns of metastatic disease she will need chemo longer than the planed 3 months.   2. 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 I recommend she sees a gDietitianto rule out lynch syndrome. She agreed to see gentics  -She has a genetic appointment Feb 01, 2017  3. 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 Liver lesions -Her initial staging CT scan showed numerous small liver lesions, indeterminate, those are not hypermetabolic on PET scan. -I recommend a liver MRI in the next few weeks for further evaluation  and follow-up.  5. History of stroke  -No residual neuro deficits. She functions well at home. -she is on aspirin and Plavix   6. ? HTN -her BP high in clinic previously, pt states it's been normal at home -will monitor   Plan:  -Lab reviewed, adequate for treatment, will proceed with second cycle of FOLFOX today, due to the mild neutropenia, I will omit 5-FU bolus -Due to the upcoming EUS and a biopsy, I will postpone her next cycle chemo to 3 weeks -I will see her back in 3 weeks  No orders of the defined types were placed in this encounter.   All questions were answered. The patient knows to call the clinic with any problems, questions or concerns.  I spent 25 minutes counseling the patient face to face. The total time spent in the appointment was 30 minutes and more than 50% was on counseling.    [name], MD 01/17/2017

## 2017-01-12 NOTE — Telephone Encounter (Signed)
Left voicemail requesting pt to call the office back (noted in the [facility])

## 2017-01-12 NOTE — Telephone Encounter (Signed)
I have refilled and informed pt.   [name] MD

## 2017-01-12 NOTE — Telephone Encounter (Signed)
Patient called inquiring about need for someone to come with her for second treatment but shared "Problem getting Eliquis refill.  Eliquis starter pack for left leg DVT after surgery.  Took last pill 11:00 pm.  Hiospitalist will not refill.  None of my doctors will.  PCP out of town for a month.  Now my right leg is swelling.  Just a tiny bit larger than the left so I took two baby aspirin about 2:00 am.  Know I need the medicine but don't know what to do.  Return number [phone] or my husband at [phone]." Routing call information to collaborative nurse and provider for review.  Further patient communication through collaborative nurse.

## 2017-01-17 ENCOUNTER — Ambulatory Visit (HOSPITAL_BASED_OUTPATIENT_CLINIC_OR_DEPARTMENT_OTHER): Payer: Medicare HMO | Admitting: Hematology

## 2017-01-17 ENCOUNTER — Ambulatory Visit: Payer: Medicare HMO

## 2017-01-17 ENCOUNTER — Ambulatory Visit (HOSPITAL_BASED_OUTPATIENT_CLINIC_OR_DEPARTMENT_OTHER): Payer: Medicare HMO

## 2017-01-17 ENCOUNTER — Other Ambulatory Visit (HOSPITAL_BASED_OUTPATIENT_CLINIC_OR_DEPARTMENT_OTHER): Payer: Medicare HMO

## 2017-01-17 VITALS — BP 147/58 | HR 87 | Temp 98.2°F | Resp 20 | Ht 64.0 in | Wt 150.1 lb

## 2017-01-17 DIAGNOSIS — D508 Other iron deficiency anemias: Secondary | ICD-10-CM

## 2017-01-17 DIAGNOSIS — I10 Essential (primary) hypertension: Secondary | ICD-10-CM

## 2017-01-17 DIAGNOSIS — C188 Malignant neoplasm of overlapping sites of colon: Secondary | ICD-10-CM | POA: Diagnosis not present

## 2017-01-17 DIAGNOSIS — K769 Liver disease, unspecified: Secondary | ICD-10-CM | POA: Diagnosis not present

## 2017-01-17 DIAGNOSIS — Z5111 Encounter for antineoplastic chemotherapy: Secondary | ICD-10-CM | POA: Diagnosis not present

## 2017-01-17 DIAGNOSIS — D509 Iron deficiency anemia, unspecified: Secondary | ICD-10-CM | POA: Diagnosis not present

## 2017-01-17 DIAGNOSIS — C787 Secondary malignant neoplasm of liver and intrahepatic bile duct: Principal | ICD-10-CM

## 2017-01-17 DIAGNOSIS — Z8673 Personal history of transient ischemic attack (TIA), and cerebral infarction without residual deficits: Secondary | ICD-10-CM | POA: Diagnosis not present

## 2017-01-17 DIAGNOSIS — C189 Malignant neoplasm of colon, unspecified: Secondary | ICD-10-CM

## 2017-01-17 LAB — COMPREHENSIVE METABOLIC PANEL
ALT: 15 U/L (ref 0–55)
AST: 16 U/L (ref 5–34)
Albumin: 3.6 g/dL (ref 3.5–5.0)
Alkaline Phosphatase: 88 U/L (ref 40–150)
Anion Gap: 8 mEq/L (ref 3–11)
BUN: 13.6 mg/dL (ref 7.0–26.0)
CO2: 24 mEq/L (ref 22–29)
Calcium: 9.2 mg/dL (ref 8.4–10.4)
Chloride: 109 mEq/L (ref 98–109)
Creatinine: 1 mg/dL (ref 0.6–1.1)
EGFR: 56 mL/min/{1.73_m2} — ABNORMAL LOW (ref >60)
Glucose: 118 mg/dl (ref 70–140)
Potassium: 4 mEq/L (ref 3.5–5.1)
Sodium: 141 mEq/L (ref 136–145)
Total Bilirubin: 0.41 mg/dL (ref 0.20–1.20)
Total Protein: 6.7 g/dL (ref 6.4–8.3)

## 2017-01-17 LAB — CBC & DIFF AND RETIC
BASO%: 1 % (ref 0.0–2.0)
Basophils Absolute: 0 10*3/uL (ref 0.0–0.1)
EOS%: 2 % (ref 0.0–7.0)
Eosinophils Absolute: 0.1 10*3/uL (ref 0.0–0.5)
HCT: 32.6 % — ABNORMAL LOW (ref 34.8–46.6)
HGB: 10 g/dL — ABNORMAL LOW (ref 11.6–15.9)
Immature Retic Fract: 11.9 % — ABNORMAL HIGH (ref 1.60–10.00)
LYMPH%: 50 % — ABNORMAL HIGH (ref 14.0–49.7)
MCH: 26.7 pg (ref 25.1–34.0)
MCHC: 30.7 g/dL — ABNORMAL LOW (ref 31.5–36.0)
MCV: 86.9 fL (ref 79.5–101.0)
MONO#: 0.7 10*3/uL (ref 0.1–0.9)
MONO%: 17.4 % — ABNORMAL HIGH (ref 0.0–14.0)
NEUT#: 1.2 10*3/uL — ABNORMAL LOW (ref 1.5–6.5)
NEUT%: 29.6 % — ABNORMAL LOW (ref 38.4–76.8)
Platelets: 290 10*3/uL (ref 145–400)
RBC: 3.75 10*6/uL (ref 3.70–5.45)
RDW: 15.4 % — ABNORMAL HIGH (ref 11.2–14.5)
Retic %: 2.6 % — ABNORMAL HIGH (ref 0.70–2.10)
Retic Ct Abs: 97.5 10*3/uL — ABNORMAL HIGH (ref 33.70–90.70)
WBC: 4 10*3/uL (ref 3.9–10.3)
lymph#: 2 10*3/uL (ref 0.9–3.3)

## 2017-01-17 MED ORDER — DEXAMETHASONE SODIUM PHOSPHATE 10 MG/ML IJ SOLN
INTRAMUSCULAR | Status: AC
  Filled 2017-01-17: qty 1

## 2017-01-17 MED ORDER — SODIUM CHLORIDE 0.9% FLUSH
10.0000 mL | Freq: Once | INTRAVENOUS | Status: AC
  Administered 2017-01-17: 10 mL
  Filled 2017-01-17: qty 10

## 2017-01-17 MED ORDER — LEUCOVORIN CALCIUM INJECTION 350 MG
400.0000 mg/m2 | Freq: Once | INTRAVENOUS | Status: AC
  Administered 2017-01-17: 688 mg via INTRAVENOUS
  Filled 2017-01-17: qty 34.4

## 2017-01-17 MED ORDER — DEXAMETHASONE SODIUM PHOSPHATE 10 MG/ML IJ SOLN
10.0000 mg | Freq: Once | INTRAMUSCULAR | Status: AC
  Administered 2017-01-17: 10 mg via INTRAVENOUS

## 2017-01-17 MED ORDER — SODIUM CHLORIDE 0.9 % IV SOLN
510.0000 mg | Freq: Once | INTRAVENOUS | Status: AC
  Administered 2017-01-17: 510 mg via INTRAVENOUS
  Filled 2017-01-17: qty 17

## 2017-01-17 MED ORDER — SODIUM CHLORIDE 0.9 % IV SOLN
2400.0000 mg/m2 | INTRAVENOUS | Status: DC
  Administered 2017-01-17: 4150 mg via INTRAVENOUS
  Filled 2017-01-17: qty 83

## 2017-01-17 MED ORDER — PALONOSETRON HCL INJECTION 0.25 MG/5ML
0.2500 mg | Freq: Once | INTRAVENOUS | Status: AC
  Administered 2017-01-17: 0.25 mg via INTRAVENOUS

## 2017-01-17 MED ORDER — DEXTROSE 5 % IV SOLN
85.0000 mg/m2 | Freq: Once | INTRAVENOUS | Status: AC
  Administered 2017-01-17: 145 mg via INTRAVENOUS
  Filled 2017-01-17: qty 20

## 2017-01-17 MED ORDER — SODIUM CHLORIDE 0.9% FLUSH
10.0000 mL | INTRAVENOUS | Status: DC | PRN
  Filled 2017-01-17: qty 10

## 2017-01-17 MED ORDER — PALONOSETRON HCL INJECTION 0.25 MG/5ML
INTRAVENOUS | Status: AC
  Filled 2017-01-17: qty 5

## 2017-01-17 MED ORDER — DEXTROSE 5 % IV SOLN
Freq: Once | INTRAVENOUS | Status: AC
  Administered 2017-01-17: 13:00:00 via INTRAVENOUS

## 2017-01-17 MED ORDER — SODIUM CHLORIDE 0.9 % IV SOLN: Freq: Once | INTRAVENOUS | Status: DC

## 2017-01-17 MED ORDER — HEPARIN SOD (PORK) LOCK FLUSH 100 UNIT/ML IV SOLN
500.0000 [IU] | Freq: Once | INTRAVENOUS | Status: DC | PRN
  Filled 2017-01-17: qty 5

## 2017-01-17 NOTE — Progress Notes (Signed)
Per Dr. Burr Medico okay to proceed with ANC 1.2, Adrucil bolus has been removed per Dr. Burr Medico.

## 2017-01-17 NOTE — Patient Instructions (Addendum)
[facility] Discharge Instructions for Patients Receiving Chemotherapy  Today you received the following chemotherapy agents Oxaliplatin, Leucovorin, Flurouracil  To help prevent nausea and vomiting after your treatment, we encourage you to take your nausea medication as directed  If you develop nausea and vomiting that is not controlled by your nausea medication, call the clinic.   BELOW ARE SYMPTOMS THAT SHOULD BE REPORTED IMMEDIATELY:  *FEVER GREATER THAN 100.5 F  *CHILLS WITH OR WITHOUT FEVER  NAUSEA AND VOMITING THAT IS NOT CONTROLLED WITH YOUR NAUSEA MEDICATION  *UNUSUAL SHORTNESS OF BREATH  *UNUSUAL BRUISING OR BLEEDING  TENDERNESS IN MOUTH AND THROAT WITH OR WITHOUT PRESENCE OF ULCERS  *URINARY PROBLEMS  *BOWEL PROBLEMS  UNUSUAL RASH Items with * indicate a potential emergency and should be followed up as soon as possible.  Feel free to call the clinic should you have any questions or concerns. The clinic phone number is [phone].  Please show the [facility] at check-in to the Emergency Department and triage nurse.  Ferumoxytol injection What is this medicine? FERUMOXYTOL is an iron complex. Iron is used to make healthy red blood cells, which carry oxygen and nutrients throughout the body. This medicine is used to treat iron deficiency anemia in people with chronic kidney disease. This medicine may be used for other purposes; ask your health care provider or pharmacist if you have questions. COMMON BRAND NAME(S): Feraheme What should I tell my health care provider before I take this medicine? They need to know if you have any of these conditions: -anemia not caused by low iron levels -high levels of iron in the blood -magnetic resonance imaging (MRI) test scheduled -an unusual or allergic reaction to iron, other medicines, foods, dyes, or preservatives -pregnant or trying to get pregnant -breast-feeding How should I use this  medicine? This medicine is for injection into a vein. It is given by a health care professional in a hospital or clinic setting. Talk to your pediatrician regarding the use of this medicine in children. Special care may be needed. Overdosage: If you think you have taken too much of this medicine contact a poison control center or emergency room at once. NOTE: This medicine is only for you. Do not share this medicine with others. What if I miss a dose? It is important not to miss your dose. Call your doctor or health care professional if you are unable to keep an appointment. What may interact with this medicine? This medicine may interact with the following medications: -other iron products This list may not describe all possible interactions. Give your health care provider a list of all the medicines, herbs, non-prescription drugs, or dietary supplements you use. Also tell them if you smoke, drink alcohol, or use illegal drugs. Some items may interact with your medicine. What should I watch for while using this medicine? Visit your doctor or healthcare professional regularly. Tell your doctor or healthcare professional if your symptoms do not start to get better or if they get worse. You may need blood work done while you are taking this medicine. You may need to follow a special diet. Talk to your doctor. Foods that contain iron include: whole grains/cereals, dried fruits, beans, or peas, leafy green vegetables, and organ meats (liver, kidney). What side effects may I notice from receiving this medicine? Side effects that you should report to your doctor or health care professional as soon as possible: -allergic reactions like skin rash, itching or hives, swelling of the  face, lips, or tongue -breathing problems -changes in blood pressure -feeling faint or lightheaded, falls -fever or chills -flushing, sweating, or hot feelings -swelling of the ankles or feet Side effects that usually do not  require medical attention (report to your doctor or health care professional if they continue or are bothersome): -diarrhea -headache -nausea, vomiting -stomach pain This list may not describe all possible side effects. Call your doctor for medical advice about side effects. You may report side effects to FDA at 1-800-FDA-1088. Where should I keep my medicine? This drug is given in a hospital or clinic and will not be stored at home. NOTE: This sheet is a summary. It may not cover all possible information. If you have questions about this medicine, talk to your doctor, pharmacist, or health care provider.  2018 Elsevier/Gold Standard (2015-03-13 12:41:49)

## 2017-01-17 NOTE — Progress Notes (Signed)
Line flushed with normal saline before and after feraheme infusion.

## 2017-01-18 ENCOUNTER — Telehealth: Payer: Self-pay | Admitting: Hematology

## 2017-01-18 ENCOUNTER — Telehealth: Payer: Self-pay | Admitting: *Deleted

## 2017-01-18 ENCOUNTER — Encounter: Payer: Self-pay | Admitting: Hematology

## 2017-01-18 DIAGNOSIS — I10 Essential (primary) hypertension: Secondary | ICD-10-CM | POA: Diagnosis not present

## 2017-01-18 DIAGNOSIS — I82422 Acute embolism and thrombosis of left iliac vein: Secondary | ICD-10-CM | POA: Diagnosis not present

## 2017-01-18 DIAGNOSIS — C188 Malignant neoplasm of overlapping sites of colon: Secondary | ICD-10-CM | POA: Diagnosis not present

## 2017-01-18 DIAGNOSIS — K589 Irritable bowel syndrome without diarrhea: Secondary | ICD-10-CM | POA: Diagnosis not present

## 2017-01-18 DIAGNOSIS — D509 Iron deficiency anemia, unspecified: Secondary | ICD-10-CM | POA: Diagnosis not present

## 2017-01-18 DIAGNOSIS — E559 Vitamin D deficiency, unspecified: Secondary | ICD-10-CM | POA: Diagnosis not present

## 2017-01-18 DIAGNOSIS — M810 Age-related osteoporosis without current pathological fracture: Secondary | ICD-10-CM | POA: Diagnosis not present

## 2017-01-18 DIAGNOSIS — G4700 Insomnia, unspecified: Secondary | ICD-10-CM | POA: Diagnosis not present

## 2017-01-18 DIAGNOSIS — Z483 Aftercare following surgery for neoplasm: Secondary | ICD-10-CM | POA: Diagnosis not present

## 2017-01-18 DIAGNOSIS — I2510 Atherosclerotic heart disease of native coronary artery without angina pectoris: Secondary | ICD-10-CM | POA: Diagnosis not present

## 2017-01-18 NOTE — Telephone Encounter (Signed)
Pt's oncologist filled med on 01/12/17

## 2017-01-18 NOTE — Telephone Encounter (Signed)
Scheduled appt per 11/26 los - Patient is aware of appt added and will pick up an updated schedule tomorrow when she comes in for pump stop appt .

## 2017-01-18 NOTE — Telephone Encounter (Signed)
Received call this am from pt stating that her legs felt very heavy yest & she had some shrimp & noodles last hs & 4 cracked walnuts & tried to take her night meds but felt like she couldn't swallow.  She reports that her throat tightened. She went to bed & got up this am & took her nausea med & stool softener & is able to swallow this am.  She states that she can't eat salmon out of the can & has a reaction to it but can eat it cooked.  She is wondering if it is the shrimp she ate.  Asked if she had anything cold.  She states no but the water she drank was from the tap & she states that it was room temp.  Informed that if she feels this again to drink something warm & see if it helps. Pt to let us know if she has any other problems.  She states that she was given written into stating that she couldn't have cereal.  Informed that she can have warm cereal.  Reviewed discharge instructions & think she misread info on foods with iron.  Instructed that these foods are good for her to eat but to avoid cold items.  Informed Dr Burr Medico.

## 2017-01-18 NOTE — Telephone Encounter (Signed)
Spoke to patient regarding upcoming appointment updates per 11/26 sch message.  °

## 2017-01-18 NOTE — Telephone Encounter (Signed)
01/18/2017 faxed most recent office notes, labs, and imaging reports to [phone]

## 2017-01-19 ENCOUNTER — Ambulatory Visit (HOSPITAL_BASED_OUTPATIENT_CLINIC_OR_DEPARTMENT_OTHER): Payer: Medicare HMO

## 2017-01-19 VITALS — BP 136/63 | HR 89 | Temp 98.2°F | Resp 18

## 2017-01-19 DIAGNOSIS — C188 Malignant neoplasm of overlapping sites of colon: Secondary | ICD-10-CM

## 2017-01-19 MED ORDER — SODIUM CHLORIDE 0.9% FLUSH
10.0000 mL | INTRAVENOUS | Status: DC | PRN
  Administered 2017-01-19: 10 mL
  Filled 2017-01-19: qty 10

## 2017-01-19 MED ORDER — HEPARIN SOD (PORK) LOCK FLUSH 100 UNIT/ML IV SOLN
500.0000 [IU] | Freq: Once | INTRAVENOUS | Status: AC | PRN
  Administered 2017-01-19: 500 [IU]
  Filled 2017-01-19: qty 5

## 2017-01-20 DIAGNOSIS — M810 Age-related osteoporosis without current pathological fracture: Secondary | ICD-10-CM | POA: Diagnosis not present

## 2017-01-20 DIAGNOSIS — K589 Irritable bowel syndrome without diarrhea: Secondary | ICD-10-CM | POA: Diagnosis not present

## 2017-01-20 DIAGNOSIS — I82422 Acute embolism and thrombosis of left iliac vein: Secondary | ICD-10-CM | POA: Diagnosis not present

## 2017-01-20 DIAGNOSIS — E559 Vitamin D deficiency, unspecified: Secondary | ICD-10-CM | POA: Diagnosis not present

## 2017-01-20 DIAGNOSIS — D509 Iron deficiency anemia, unspecified: Secondary | ICD-10-CM | POA: Diagnosis not present

## 2017-01-20 DIAGNOSIS — I2510 Atherosclerotic heart disease of native coronary artery without angina pectoris: Secondary | ICD-10-CM | POA: Diagnosis not present

## 2017-01-20 DIAGNOSIS — Z483 Aftercare following surgery for neoplasm: Secondary | ICD-10-CM | POA: Diagnosis not present

## 2017-01-20 DIAGNOSIS — G4700 Insomnia, unspecified: Secondary | ICD-10-CM | POA: Diagnosis not present

## 2017-01-20 DIAGNOSIS — I10 Essential (primary) hypertension: Secondary | ICD-10-CM | POA: Diagnosis not present

## 2017-01-20 DIAGNOSIS — C188 Malignant neoplasm of overlapping sites of colon: Secondary | ICD-10-CM | POA: Diagnosis not present

## 2017-01-25 DIAGNOSIS — K589 Irritable bowel syndrome without diarrhea: Secondary | ICD-10-CM | POA: Diagnosis not present

## 2017-01-25 DIAGNOSIS — I2510 Atherosclerotic heart disease of native coronary artery without angina pectoris: Secondary | ICD-10-CM | POA: Diagnosis not present

## 2017-01-25 DIAGNOSIS — G4700 Insomnia, unspecified: Secondary | ICD-10-CM | POA: Diagnosis not present

## 2017-01-25 DIAGNOSIS — I10 Essential (primary) hypertension: Secondary | ICD-10-CM | POA: Diagnosis not present

## 2017-01-25 DIAGNOSIS — C188 Malignant neoplasm of overlapping sites of colon: Secondary | ICD-10-CM | POA: Diagnosis not present

## 2017-01-25 DIAGNOSIS — Z483 Aftercare following surgery for neoplasm: Secondary | ICD-10-CM | POA: Diagnosis not present

## 2017-01-25 DIAGNOSIS — E559 Vitamin D deficiency, unspecified: Secondary | ICD-10-CM | POA: Diagnosis not present

## 2017-01-25 DIAGNOSIS — I82422 Acute embolism and thrombosis of left iliac vein: Secondary | ICD-10-CM | POA: Diagnosis not present

## 2017-01-25 DIAGNOSIS — D509 Iron deficiency anemia, unspecified: Secondary | ICD-10-CM | POA: Diagnosis not present

## 2017-01-25 DIAGNOSIS — M810 Age-related osteoporosis without current pathological fracture: Secondary | ICD-10-CM | POA: Diagnosis not present

## 2017-01-26 ENCOUNTER — Encounter (HOSPITAL_COMMUNITY): Payer: Self-pay | Admitting: *Deleted

## 2017-01-26 ENCOUNTER — Other Ambulatory Visit: Payer: Self-pay

## 2017-01-28 ENCOUNTER — Telehealth: Payer: Self-pay | Admitting: *Deleted

## 2017-01-28 DIAGNOSIS — I2510 Atherosclerotic heart disease of native coronary artery without angina pectoris: Secondary | ICD-10-CM | POA: Diagnosis not present

## 2017-01-28 DIAGNOSIS — G4700 Insomnia, unspecified: Secondary | ICD-10-CM | POA: Diagnosis not present

## 2017-01-28 DIAGNOSIS — C188 Malignant neoplasm of overlapping sites of colon: Secondary | ICD-10-CM | POA: Diagnosis not present

## 2017-01-28 DIAGNOSIS — E559 Vitamin D deficiency, unspecified: Secondary | ICD-10-CM | POA: Diagnosis not present

## 2017-01-28 DIAGNOSIS — M810 Age-related osteoporosis without current pathological fracture: Secondary | ICD-10-CM | POA: Diagnosis not present

## 2017-01-28 DIAGNOSIS — I82422 Acute embolism and thrombosis of left iliac vein: Secondary | ICD-10-CM | POA: Diagnosis not present

## 2017-01-28 DIAGNOSIS — D509 Iron deficiency anemia, unspecified: Secondary | ICD-10-CM | POA: Diagnosis not present

## 2017-01-28 DIAGNOSIS — K589 Irritable bowel syndrome without diarrhea: Secondary | ICD-10-CM | POA: Diagnosis not present

## 2017-01-28 DIAGNOSIS — I10 Essential (primary) hypertension: Secondary | ICD-10-CM | POA: Diagnosis not present

## 2017-01-28 DIAGNOSIS — Z483 Aftercare following surgery for neoplasm: Secondary | ICD-10-CM | POA: Diagnosis not present

## 2017-01-28 NOTE — Telephone Encounter (Signed)
Wrong # given in phone note (it's the pt's #) [facility] and called their main office # and gave a nurse on call the verbal order

## 2017-01-28 NOTE — Telephone Encounter (Signed)
Please ok that verbal order  

## 2017-01-28 NOTE — Telephone Encounter (Signed)
Copied from [place] [phone]. Topic: Inquiry >> Jan 28, 2017  2:09 PM [name] Pila B wrote: Reason for CRM: Tillie Rung from [facility] home [facility] needs verbal orders for the pt consisting of extending services of 2x's a week for 2 weeks, once a week for 2 weeks then, once every other week for 2 weeks

## 2017-02-01 ENCOUNTER — Encounter: Payer: Medicare HMO | Admitting: Genetics

## 2017-02-01 ENCOUNTER — Other Ambulatory Visit: Payer: Medicare HMO

## 2017-02-02 ENCOUNTER — Ambulatory Visit: Payer: Medicare HMO

## 2017-02-02 ENCOUNTER — Other Ambulatory Visit: Payer: Medicare HMO

## 2017-02-02 ENCOUNTER — Ambulatory Visit: Payer: Medicare HMO | Admitting: Nurse Practitioner

## 2017-02-02 DIAGNOSIS — I2510 Atherosclerotic heart disease of native coronary artery without angina pectoris: Secondary | ICD-10-CM | POA: Diagnosis not present

## 2017-02-02 DIAGNOSIS — Z483 Aftercare following surgery for neoplasm: Secondary | ICD-10-CM | POA: Diagnosis not present

## 2017-02-02 DIAGNOSIS — D509 Iron deficiency anemia, unspecified: Secondary | ICD-10-CM | POA: Diagnosis not present

## 2017-02-02 DIAGNOSIS — G4700 Insomnia, unspecified: Secondary | ICD-10-CM | POA: Diagnosis not present

## 2017-02-02 DIAGNOSIS — K589 Irritable bowel syndrome without diarrhea: Secondary | ICD-10-CM | POA: Diagnosis not present

## 2017-02-02 DIAGNOSIS — M810 Age-related osteoporosis without current pathological fracture: Secondary | ICD-10-CM | POA: Diagnosis not present

## 2017-02-02 DIAGNOSIS — E559 Vitamin D deficiency, unspecified: Secondary | ICD-10-CM | POA: Diagnosis not present

## 2017-02-02 DIAGNOSIS — I82422 Acute embolism and thrombosis of left iliac vein: Secondary | ICD-10-CM | POA: Diagnosis not present

## 2017-02-02 DIAGNOSIS — I10 Essential (primary) hypertension: Secondary | ICD-10-CM | POA: Diagnosis not present

## 2017-02-02 DIAGNOSIS — C188 Malignant neoplasm of overlapping sites of colon: Secondary | ICD-10-CM | POA: Diagnosis not present

## 2017-02-03 ENCOUNTER — Ambulatory Visit (HOSPITAL_COMMUNITY): Payer: Medicare HMO | Admitting: Certified Registered Nurse Anesthetist

## 2017-02-03 ENCOUNTER — Encounter (HOSPITAL_COMMUNITY): Payer: Self-pay | Admitting: *Deleted

## 2017-02-03 ENCOUNTER — Other Ambulatory Visit: Payer: Self-pay

## 2017-02-03 ENCOUNTER — Encounter (HOSPITAL_COMMUNITY)
Admission: RE | Disposition: A | Discharge status: Home / Self Care | Payer: Self-pay | Source: Ambulatory Visit | Attending: Gastroenterology

## 2017-02-03 ENCOUNTER — Ambulatory Visit (HOSPITAL_COMMUNITY)
Admission: RE | Admit: 2017-02-03 | Discharge: 2017-02-03 | Disposition: A | Discharge status: Home / Self Care | Payer: Medicare HMO | Source: Ambulatory Visit | Attending: Gastroenterology | Admitting: Gastroenterology

## 2017-02-03 DIAGNOSIS — Z87891 Personal history of nicotine dependence: Secondary | ICD-10-CM | POA: Diagnosis not present

## 2017-02-03 DIAGNOSIS — I69351 Hemiplegia and hemiparesis following cerebral infarction affecting right dominant side: Secondary | ICD-10-CM | POA: Diagnosis not present

## 2017-02-03 DIAGNOSIS — Z888 Allergy status to other drugs, medicaments and biological substances status: Secondary | ICD-10-CM | POA: Insufficient documentation

## 2017-02-03 DIAGNOSIS — G4709 Other insomnia: Secondary | ICD-10-CM | POA: Diagnosis not present

## 2017-02-03 DIAGNOSIS — Z885 Allergy status to narcotic agent status: Secondary | ICD-10-CM | POA: Insufficient documentation

## 2017-02-03 DIAGNOSIS — K581 Irritable bowel syndrome with constipation: Secondary | ICD-10-CM | POA: Diagnosis not present

## 2017-02-03 DIAGNOSIS — D500 Iron deficiency anemia secondary to blood loss (chronic): Secondary | ICD-10-CM | POA: Diagnosis not present

## 2017-02-03 DIAGNOSIS — K769 Liver disease, unspecified: Secondary | ICD-10-CM | POA: Diagnosis not present

## 2017-02-03 DIAGNOSIS — R590 Localized enlarged lymph nodes: Secondary | ICD-10-CM | POA: Diagnosis not present

## 2017-02-03 DIAGNOSIS — Z9049 Acquired absence of other specified parts of digestive tract: Secondary | ICD-10-CM | POA: Insufficient documentation

## 2017-02-03 DIAGNOSIS — M1990 Unspecified osteoarthritis, unspecified site: Secondary | ICD-10-CM | POA: Diagnosis not present

## 2017-02-03 DIAGNOSIS — E785 Hyperlipidemia, unspecified: Secondary | ICD-10-CM | POA: Insufficient documentation

## 2017-02-03 DIAGNOSIS — Z7902 Long term (current) use of antithrombotics/antiplatelets: Secondary | ICD-10-CM | POA: Diagnosis not present

## 2017-02-03 DIAGNOSIS — Z880 Allergy status to penicillin: Secondary | ICD-10-CM | POA: Diagnosis not present

## 2017-02-03 DIAGNOSIS — D509 Iron deficiency anemia, unspecified: Secondary | ICD-10-CM | POA: Insufficient documentation

## 2017-02-03 DIAGNOSIS — R599 Enlarged lymph nodes, unspecified: Secondary | ICD-10-CM

## 2017-02-03 DIAGNOSIS — R591 Generalized enlarged lymph nodes: Secondary | ICD-10-CM | POA: Diagnosis not present

## 2017-02-03 DIAGNOSIS — Z85038 Personal history of other malignant neoplasm of large intestine: Secondary | ICD-10-CM | POA: Diagnosis not present

## 2017-02-03 DIAGNOSIS — Z79899 Other long term (current) drug therapy: Secondary | ICD-10-CM | POA: Diagnosis not present

## 2017-02-03 DIAGNOSIS — Z8249 Family history of ischemic heart disease and other diseases of the circulatory system: Secondary | ICD-10-CM | POA: Diagnosis not present

## 2017-02-03 DIAGNOSIS — R935 Abnormal findings on diagnostic imaging of other abdominal regions, including retroperitoneum: Secondary | ICD-10-CM

## 2017-02-03 DIAGNOSIS — Z882 Allergy status to sulfonamides status: Secondary | ICD-10-CM | POA: Diagnosis not present

## 2017-02-03 DIAGNOSIS — Z7982 Long term (current) use of aspirin: Secondary | ICD-10-CM | POA: Insufficient documentation

## 2017-02-03 DIAGNOSIS — Z91013 Allergy to seafood: Secondary | ICD-10-CM | POA: Insufficient documentation

## 2017-02-03 DIAGNOSIS — I2510 Atherosclerotic heart disease of native coronary artery without angina pectoris: Secondary | ICD-10-CM | POA: Insufficient documentation

## 2017-02-03 DIAGNOSIS — K219 Gastro-esophageal reflux disease without esophagitis: Secondary | ICD-10-CM | POA: Insufficient documentation

## 2017-02-03 DIAGNOSIS — M810 Age-related osteoporosis without current pathological fracture: Secondary | ICD-10-CM | POA: Diagnosis not present

## 2017-02-03 DIAGNOSIS — C188 Malignant neoplasm of overlapping sites of colon: Secondary | ICD-10-CM

## 2017-02-03 DIAGNOSIS — E559 Vitamin D deficiency, unspecified: Secondary | ICD-10-CM | POA: Diagnosis not present

## 2017-02-03 HISTORY — DX: Unspecified astigmatism, unspecified eye: H52.209

## 2017-02-03 HISTORY — DX: Other hammer toe(s) (acquired), right foot: M20.41

## 2017-02-03 HISTORY — DX: Headache: R51

## 2017-02-03 HISTORY — PX: EUS: SHX5427

## 2017-02-03 HISTORY — DX: Headache, unspecified: R51.9

## 2017-02-03 HISTORY — DX: Unspecified glaucoma: H40.9

## 2017-02-03 HISTORY — DX: Other hammer toe(s) (acquired), left foot: M20.42

## 2017-02-03 SURGERY — UPPER ENDOSCOPIC ULTRASOUND (EUS) LINEAR
Anesthesia: Monitor Anesthesia Care

## 2017-02-03 MED ORDER — LACTATED RINGERS IV SOLN
INTRAVENOUS | Status: DC
  Administered 2017-02-03: 1000 mL via INTRAVENOUS

## 2017-02-03 MED ORDER — PROPOFOL 500 MG/50ML IV EMUL
INTRAVENOUS | Status: DC | PRN
  Administered 2017-02-03: 100 ug/kg/min via INTRAVENOUS

## 2017-02-03 MED ORDER — PROPOFOL 10 MG/ML IV BOLUS
INTRAVENOUS | Status: AC
  Filled 2017-02-03: qty 40

## 2017-02-03 MED ORDER — PROPOFOL 500 MG/50ML IV EMUL
INTRAVENOUS | Status: DC | PRN
  Administered 2017-02-03 (×2): 30 mg via INTRAVENOUS

## 2017-02-03 MED ORDER — PROPOFOL 10 MG/ML IV BOLUS
INTRAVENOUS | Status: AC
  Filled 2017-02-03: qty 20

## 2017-02-03 MED ORDER — EPHEDRINE SULFATE 50 MG/ML IJ SOLN
INTRAMUSCULAR | Status: DC | PRN
  Administered 2017-02-03: 10 mg via INTRAVENOUS

## 2017-02-03 MED ORDER — SODIUM CHLORIDE 0.9 % IV SOLN: INTRAVENOUS | Status: DC

## 2017-02-03 NOTE — Discharge Instructions (Signed)
Esophagogastroduodenoscopy, Care After °Refer to this sheet in the next few weeks. These instructions provide you with information about caring for yourself after your procedure. Your health care provider may also give you more specific instructions. Your treatment has been planned according to current medical practices, but problems sometimes occur. Call your health care provider if you have any problems or questions after your procedure. °What can I expect after the procedure? °After the procedure, it is common to have: °· A sore throat. °· Nausea. °· Bloating. °· Dizziness. °· Fatigue. ° °Follow these instructions at home: °· Do not eat or drink anything until the numbing medicine (local anesthetic) has worn off and your gag reflex has returned. You will know that the local anesthetic has worn off when you can swallow comfortably. °· Do not drive for 24 hours if you received a medicine to help you relax (sedative). °· If your health care provider took a tissue sample for testing during the procedure, make sure to get your test results. This is your responsibility. Ask your health care provider or the department performing the test when your results will be ready. °· Keep all follow-up visits as told by your health care provider. This is important. °Contact a health care provider if: °· You cannot stop coughing. °· You are not urinating. °· You are urinating less than usual. °Get help right away if: °· You have trouble swallowing. °· You cannot eat or drink. °· You have throat or chest pain that gets worse. °· You are dizzy or light-headed. °· You faint. °· You have nausea or vomiting. °· You have chills. °· You have a fever. °· You have severe abdominal pain. °· You have black, tarry, or bloody stools. °This information is not intended to replace advice given to you by your health care provider. Make sure you discuss any questions you have with your health care provider. °Document Released: 01/26/2012 Document  Revised: 07/17/2015 Document Reviewed: 01/02/2015 °Elsevier Interactive Patient Education © 2018 Elsevier Inc. ° °

## 2017-02-03 NOTE — Interval H&P Note (Signed)
History and Physical Interval Note:  02/03/2017 9:42 AM  [name]  has presented today for surgery, with the diagnosis of colon cancer, abdominal adenopathy  The various methods of treatment have been discussed with the patient and family. After consideration of risks, benefits and other options for treatment, the patient has consented to  Procedure(s): UPPER ENDOSCOPIC ULTRASOUND (EUS) LINEAR (N/A) as a surgical intervention .  The patient's history has been reviewed, patient examined, no change in status, stable for surgery.  I have reviewed the patient's chart and labs.  Questions were answered to the patient's satisfaction.     [name]

## 2017-02-03 NOTE — Op Note (Signed)
[facility] Patient Name: [name] Procedure Date: 02/03/2017 MRN: [id number] Attending MD: [name] , MD Date of Birth: [birth year] CSN: [id number] Age: 76 Admit Type: Outpatient Procedure:                Upper EUS Indications:              PET avid periportal adenopathy; s/p extended right                            hemicolectomy for metachronous colon cancer 4-5                            months ago, 1/60 nodes + Providers:                [name], MD, Laverta Baltimore RN, RN,                            Cherylynn Ridges, Technician, Herbie Drape, CRNA Referring MD:             [name], MD Medicines:                Monitored Anesthesia Care Complications:            No immediate complications. Estimated blood loss:                            None. Estimated Blood Loss:     Estimated blood loss: none. Procedure:                Pre-Anesthesia Assessment:                           - Prior to the procedure, a History and Physical                            was performed, and patient medications and                            allergies were reviewed. The patient's tolerance of                            previous anesthesia was also reviewed. The risks                            and benefits of the procedure and the sedation                            options and risks were discussed with the patient.                            All questions were answered, and informed consent                            was obtained. Prior Anticoagulants: The patient has  taken no previous anticoagulant or antiplatelet                            agents. ASA Grade Assessment: II - A patient with                            mild systemic disease. After reviewing the risks                            and benefits, the patient was deemed in                            satisfactory condition to undergo the procedure.                           After  obtaining informed consent, the endoscope was                            passed under direct vision. Throughout the                            procedure, the patient's blood pressure, pulse, and                            oxygen saturations were monitored continuously. The                            [id number] ([id number]) scope was introduced through                            the mouth, and advanced to the second part of                            duodenum. The [id number] ([id number]) scope was                            introduced through the mouth, and advanced to the                            second part of duodenum. The upper EUS was                            accomplished without difficulty. The patient                            tolerated the procedure well. Scope In: Scope Out: Findings:      Endoscopic Finding :      The examined esophagus was endoscopically normal.      The entire examined stomach was endoscopically normal.      The examined duodenum was endoscopically normal.      Endosonographic Finding (limited evaluation for tissue acquisition): :      1. One enlarged lymph node (vs soft tissue mass) was visualized in the       porta hepatis region. It  measured 16 mm in maximal cross-sectional       diameter. The node (vs soft tissue) was oval, isoechoic and had poorly       defined margins. Fine needle aspiration for cytology was performed.       Color Doppler imaging was utilized prior to needle puncture to confirm a       lack of significant vascular structures within the needle path. Three       passes were made with the 25 gauge needle using a transgastric approach.      2. CBD was normal, non-dilated      3. Limited views of the liver, spleen, pancreas were all normal. Impression:               One periportal soft tissue mass (lymphnode?)                            measuring 49mm was noted and sampled with FNA. Moderate Sedation:      N/A- Per Anesthesia  Care Recommendation:           - Discharge patient to home (ambulatory).                           - Await cytology results. Procedure Code(s):        --- Professional ---                           [phone], Esophagogastroduodenoscopy, flexible,                            transoral; with transendoscopic ultrasound-guided                            intramural or transmural fine needle                            aspiration/biopsy(s), (includes endoscopic                            ultrasound examination limited to the esophagus,                            stomach or duodenum, and adjacent structures) Diagnosis Code(s):        --- Professional ---                           R59.0, Localized enlarged lymph nodes                           R93.5, Abnormal findings on diagnostic imaging of                            other abdominal regions, including retroperitoneum CPT copyright 2016 American Medical Association. All rights reserved. The codes documented in this report are preliminary and upon coder review may  be revised to meet current compliance requirements. [name], MD 02/03/2017 11:06:43 AM This report has been signed electronically. Number of Addenda: 0

## 2017-02-03 NOTE — Transfer of Care (Signed)
Immediate Anesthesia Transfer of Care Note  Patient: [name]  Procedure(s) Performed: UPPER ENDOSCOPIC ULTRASOUND (EUS) LINEAR (N/A )  Patient Location: PACU  Anesthesia Type:MAC  Level of Consciousness: awake, alert  and oriented  Airway & Oxygen Therapy: Patient Spontanous Breathing and Patient connected to face mask oxygen  Post-op Assessment: Report given to RN and Post -op Vital signs reviewed and stable  Post vital signs: Reviewed and stable  Last Vitals:  Vitals:   02/03/17 0929  BP: 136/64  Pulse: 87  Resp: 12  Temp: 36.5 C  SpO2: 97%    Last Pain:  Vitals:   02/03/17 0929  TempSrc: Oral         Complications: No apparent anesthesia complications

## 2017-02-03 NOTE — Anesthesia Preprocedure Evaluation (Signed)
Anesthesia Evaluation  Patient identified by MRN, date of birth, ID band Patient awake    Reviewed: Allergy & Precautions, NPO status , Patient's Chart, lab work & pertinent test results  History of Anesthesia Complications (+) PONV  Airway Mallampati: I  TM Distance: >3 FB Neck ROM: Full    Dental  (+) Chipped, Dental Advisory Given   Pulmonary former smoker,    breath sounds clear to auscultation       Cardiovascular hypertension, + CAD ('12 Non-obstructive)   Rhythm:Regular Rate:Normal  '17 ECHO: EF 60-65%, valves OK   Neuro/Psych negative neurological ROS     GI/Hepatic Neg liver ROS, GERD  Medicated and Controlled,Colon cancer: chemo   Endo/Other    Renal/GU negative Renal ROS     Musculoskeletal   Abdominal   Peds  Hematology  (+) Blood dyscrasia (eliquis), ,   Anesthesia Other Findings   Reproductive/Obstetrics                             Anesthesia Physical Anesthesia Plan  ASA: III  Anesthesia Plan: MAC   Post-op Pain Management:    Induction:   PONV Risk Score and Plan: 3 and Ondansetron  Airway Management Planned: Natural Airway and Nasal Cannula  Additional Equipment:   Intra-op Plan:   Post-operative Plan:   Informed Consent: I have reviewed the patients History and Physical, chart, labs and discussed the procedure including the risks, benefits and alternatives for the proposed anesthesia with the patient or authorized representative who has indicated his/her understanding and acceptance.   Dental advisory given  Plan Discussed with: CRNA and Surgeon  Anesthesia Plan Comments: (Plan routine monitors, MAC)        Anesthesia Quick Evaluation

## 2017-02-03 NOTE — Anesthesia Postprocedure Evaluation (Signed)
Anesthesia Post Note  Patient: [name]  Procedure(s) Performed: UPPER ENDOSCOPIC ULTRASOUND (EUS) LINEAR (N/A )     Patient location during evaluation: Endoscopy Anesthesia Type: MAC Level of consciousness: oriented, patient cooperative and awake and alert Pain management: pain level controlled Vital Signs Assessment: post-procedure vital signs reviewed and stable Respiratory status: spontaneous breathing, nonlabored ventilation and respiratory function stable Cardiovascular status: blood pressure returned to baseline and stable Postop Assessment: no apparent nausea or vomiting and adequate PO intake Anesthetic complications: no    Last Vitals:  Vitals:   02/03/17 1056 02/03/17 1100  BP: (!) 115/57 (!) 123/56  Pulse: 78 79  Resp: 20 16  Temp: 36.8 C   SpO2: 100% 100%    Last Pain:  Vitals:   02/03/17 1056  TempSrc: Oral                 Braxxton Stoudt,E. Palmira Stickle

## 2017-02-04 ENCOUNTER — Encounter (HOSPITAL_COMMUNITY): Payer: Self-pay | Admitting: Gastroenterology

## 2017-02-04 DIAGNOSIS — Z483 Aftercare following surgery for neoplasm: Secondary | ICD-10-CM | POA: Diagnosis not present

## 2017-02-04 DIAGNOSIS — I10 Essential (primary) hypertension: Secondary | ICD-10-CM | POA: Diagnosis not present

## 2017-02-04 DIAGNOSIS — D509 Iron deficiency anemia, unspecified: Secondary | ICD-10-CM | POA: Diagnosis not present

## 2017-02-04 DIAGNOSIS — K589 Irritable bowel syndrome without diarrhea: Secondary | ICD-10-CM | POA: Diagnosis not present

## 2017-02-04 DIAGNOSIS — I82422 Acute embolism and thrombosis of left iliac vein: Secondary | ICD-10-CM | POA: Diagnosis not present

## 2017-02-04 DIAGNOSIS — I2510 Atherosclerotic heart disease of native coronary artery without angina pectoris: Secondary | ICD-10-CM | POA: Diagnosis not present

## 2017-02-04 DIAGNOSIS — E559 Vitamin D deficiency, unspecified: Secondary | ICD-10-CM | POA: Diagnosis not present

## 2017-02-04 DIAGNOSIS — M6281 Muscle weakness (generalized): Secondary | ICD-10-CM | POA: Diagnosis not present

## 2017-02-04 DIAGNOSIS — C188 Malignant neoplasm of overlapping sites of colon: Secondary | ICD-10-CM | POA: Diagnosis not present

## 2017-02-04 DIAGNOSIS — M810 Age-related osteoporosis without current pathological fracture: Secondary | ICD-10-CM | POA: Diagnosis not present

## 2017-02-06 NOTE — Progress Notes (Signed)
[place]  [phone] Fax:([phone]) [phone]  Clinic Follow up Note   Patient Care Team: Tower, [name], MD as PCP - General [name], MD as Consulting Physician (Hematology) [name], MD as Attending Physician (Gastroenterology) [name], MD as Consulting Physician (General Surgery) 02/07/2017  CHIEF COMPLAINT: F/u colorectal adenocarcinoma    SUMMARY OF ONCOLOGIC HISTORY:   Cancer of overlapping sites of colon ([place])   08/31/2016 Tumor Marker    Patient's tumor was tested for the following markers: CEA. Results of the tumor marker test revealed 2.0.      08/31/2016 Pathology Results    Colon biopsy Diagnosis: 1. Surgical [P], hepatic flexure. ADENOCARCINOMA. Moderately differentiated. 2. Surgical [P], transverse. ADENOCARCINOMA, Moderately differentiated.      08/31/2016 Initial Diagnosis    Colon cancer metastasized to liver ([place])      08/31/2016 Procedure    - 1) Polypoid mass at the hepatic flexure, 2cm, soft but clearly neoplastic.  - 2) Clearly malignant mass in the distal transverse colon; 4cm, firm, 1/2 cirumference, ulcerated. ' - 3) Four small (3-62m) scattered typical appearing adenomas throughout the colon, notremoved since she mistakenly stayed on her plavix for this procedure.      09/01/2016 Imaging    CT Abdomen Pelvis W Contrast IMPRESSION: There are 2 colonic lesions involving the proximal ascending colon and proximal transverse colon, as detailed above, highly concerning for colonic neoplasm. In addition, there are at least 2 indeterminate liver lesions which are suspicious for potential metastatic disease.       09/16/2016 Imaging    Mr Liver W Wo Contrast IMPRESSION: Lesion within central right lobe of liver exhibits of peripheral enhancement and is suspicious for liver metastasis. Lesion within caudate lobe of liver identified on recent CT does not have a corresponding signal or enhancement abnormality on today's  study. Within the inferior right lobe of liver there is an enhancing structure which is favored to represent an atypical benign hemangioma with liver metastasis felt less likely. The transverse colon lesion is again identified compatible with colonic adenocarcinoma.      10/04/2016 PET scan    PET 10/04/16 IMPRESSION: 1. Highly hypermetabolic proximal transverse colon mass, maximum SUV 30.8. Small focus of hypermetabolic activity in the mesentery just above this mass probably represents a local involved lymph node. 2. Moderately hypermetabolic ascending colon mass, maximum SUV 8.7. 3. Highly hypermetabolic enlarged portacaval lymph node, maximum SUV 16.7. 4. Focus of hypermetabolic activity near the duodenum bulb could be physiologic or due to an adjacent lymph node. 5. None of the liver lesions are discernibly hypermetabolic. Particularly the more cephalad right hepatic lobe lesion merits surveillance, it had nonspecific enhancement characteristics on prior cross-sectional imaging and only a thin enhancing wall which could conceivably predispose to false negative due to central necrosis. 6. Several tiny chronic pulmonary nodules are likely benign. 7. Other imaging findings of potential clinical significance: Aortic Atherosclerosis (ICD10-I70.0). Coronary atherosclerosis. Bilateral nonobstructive nephrolithiasis.      11/10/2016 Surgery    LAPAROSCOPIC EXTENDED RIGHT COLECTOMY by Dr. [name]      11/10/2016 Pathology Results    Diagnosis 11/10/16 Colon, segmental resection for tumor, Ascending and Transverse ADENOCARCINOMA OF THE ASCENDING COLON WITH EXTRA CELLULAR MUCIN, GRADE 3, SIZE 3.4 CM THE TUMOR INVADES MUSCULARIS PROPRIA POORLY DIFFERENTIATED ADENOCARCINOMA OF THE TRANSVERSE COLON, GRADE 4, SIZE 6.1 CM THE TUMOR INVADES THROUGH MUSCULARIS PROPRIA INTO PERICOLONIC SOFT TISSUE EXTRAMURAL SATELLITE TUMOR NODULES IS IDENTIFIED (X1) ALL MARGINS OF RESECTION ARE  NEGATIVE FOR  CARCINOMA METASTATIC CARCINOMA IN ONE OF SIXTY LYMPH NODES (1/60) TABULAR ADENOMA X1      12/28/2016 Imaging    MRI abdomen w/wo contrast IMPRESSION: Motion degraded images.  Two indeterminate right liver lesions measuring up to 1.6 cm, as described above. Continued attention on follow-up is suggested.  Two periportal lymph nodes measuring up to 1.9 cm, suspicious for nodal metastases.  Postsurgical changes related to right colon resection.       12/29/2016 -  Chemotherapy    FOLFOX every 2 weeks starting 12/29/16       02/03/2017 Procedure    EUS Per [name] Endoscopic Finding Findings: The examined esophagus was endoscopically normal. The entire examined stomach was endoscopically normal. The examined duodenum was endoscopically normal. Endosonographic Finding (limited evaluation for tissue acquisition): 1. One enlarged lymph node (vs soft tissue mass) was visualized in the porta hepatis region. It measured 16 mm in maximal cross-sectional diameter. The node (vs soft tissue) was oval, isoechoic and had poorly defined margins. Fine needle aspiration for cytology was performed. Color Doppler imaging was utilized prior to needle puncture to confirm a lack of significant vascular structures within the needle path. Threepasses were made with the 25 gauge needle using a transgastric approach. 2. CBD was normal, non-dilated 3. Limited views of the liver, spleen, pancreas were all normal.  Impression: One periportal soft tissue mass (lymphnode?) measuring 63m was noted and sampled with FNA.      02/03/2017 Pathology Results    Diagnosis FINE NEEDLE ASPIRATION, ENDOSCOPIC, PERI PORTAL NODE (SPECIMEN 1 OF 1 COLLECTED 02/03/17): NO MALIGNANT CELLS IDENTIFIED. SCANT LYMPHOID AND BENIGN GLANDULAR ELEMENTS.     CURRENT THERAPY: FOLFOX q2 weeks starting 12/29/16  INTERVAL HISTORY: Ms. GSchneckreturns for f/u as scheduled prior to cycle 3 FOLFOX. She had EUS biopsy on  02/03/17 which she tolerated well. She has mild to moderate fatigue for 1 week after chemotherapy but this week she has felt more energetic. Has good appetite. Cold sensitivity lasts approx 1 week, some tingling in fingertips when she touches cold things outside. Mild intermittent numbness to toes, present at baseline related to past foot surgery; not painful or impairing function, balance, or gait. Experienced nausea with periodic vomiting on day of pump d/c that lasted for a few days. She took zofran which made her vomit again. Compazine seemed to help better. On chemo stool is usually soft but she had an episode of constipation so she tool miralax, used suppository, and ate prunes, and apparently had to manually disimpact herself. Stool subsequently became watery x1. Has sinus drainage and feels like her ears are "clogged," but not painful. Reportedly has had blocked eustachian tube since her 20's. Occasionally feels dizzy with position change, no fall. Occasional dry cough. Denies fever, chills, sore throat, chest pain, dyspnea. She is taking over the counter cold medicine that makes it hard to sleep; melatonin does not help but benadryl does.   REVIEW OF SYSTEMS:   Constitutional: Denies fevers, chills or abnormal weight loss (+) mild to moderate fatigue x1 week after chemo, improves week off chemo (+) good appetite  Eyes: Denies blurriness of vision Ears, nose, mouth, throat, and face: Denies mucositis or sore throat (+) mild gum bleeding previously, now resolved with soft-bristle tooth brush (+) sinus drainage with clogged ears/ear fullness, using otc cold medicine (+) blocked eustachian tubes since her 20's Respiratory: Denies dyspnea or wheezes (+) intermittent dry cough  Cardiovascular: Denies palpitation, chest discomfort or lower extremity swelling  Gastrointestinal:  Denies heartburn or abdominal pain (+) intermittent nausea beginning day 3 with pump d/c (+) emesis x2 on day 3; compazine helped  more than zofran   Skin: Denies abnormal skin rashes Lymphatics: Denies new lymphadenopathy or easy bruising Neurological:Denies numbness, tingling or new weaknesses (+) cold sensitivity to fingers x1 week (+) intermittent numbness to toes, present at baseline, related to past foot surgery (+) periodic dizziness with position change, sensation of room spinning  Behavioral/Psych: Mood is stable, no new changes (+) difficulty sleeping, melatonin does not improve much  All other systems were reviewed with the patient and are negative.  MEDICAL HISTORY:  Past Medical History:  Diagnosis Date  . Anemia   . Arthritis   . Astigmatism    both eyes  . Cancer [facility])    colon surgery done last chemo tx 01-17-17  . Coronary artery disease, non-occlusive   . Dyspnea    more so exertion  . Dysrhythmia    skipped beats every now and then  . GERD (gastroesophageal reflux disease)   . Glaucoma    both eyes right eye worse than left  . Hammer toes of both feet   . Headache    migraines with aura  . Heel spur    both heels and front  . HSV (herpes simplex virus) infection   . Hyperglycemia   . Hyperlipidemia   . IBS (irritable bowel syndrome)   . IC (interstitial cystitis)   . Insomnia disorder related to known organic factor   . Osteoporosis   . PONV (postoperative nausea and vomiting)    has had more surgeries, and had no problems  . Stroke ([place]) 2016  . Vitamin D deficiency     SURGICAL HISTORY: Past Surgical History:  Procedure Laterality Date  . ABDOMINAL HYSTERECTOMY     1 ovary and womb removed  . CARDIAC CATHETERIZATION  7/12   non obst dz -- not sure what yr  . CHOLECYSTECTOMY    . EUS N/A 02/03/2017   Procedure: UPPER ENDOSCOPIC ULTRASOUND (EUS) LINEAR;  Surgeon: [name], MD;  Location: [facility];  Service: Endoscopy;  Laterality: N/A;  . EYE SURGERY Bilateral    ioc for cataracts   . LAPAROSCOPIC RIGHT COLECTOMY Right 11/10/2016   Procedure: LAPAROSCOPIC  EXTENDED RIGHT COLECTOMY;  Surgeon: [name], MD;  Location: [facility];  Service: General;  Laterality: Right;  . PORTACATH PLACEMENT Left 12/22/2016   Procedure: INSERTION PORT-A-CATH;  Surgeon: [name], MD;  Location: [facility];  Service: General;  Laterality: Left;  . ROTATOR CUFF REPAIR Bilateral     I have reviewed the social history and family history with the patient and they are unchanged from previous note.  ALLERGIES:  is allergic to salmon [fish allergy]; ampicillin; codeine; flagyl [metronidazole]; meperidine hcl; neomycin; penicillins; propoxyphene hcl; sulfa antibiotics; and tramadol hcl.  MEDICATIONS:  Current Outpatient Medications  Medication Sig Dispense Refill  . apixaban (ELIQUIS) 5 MG TABS tablet Take 1 tablet (5 mg total) by mouth 2 (two) times daily. 60 tablet 2  . atorvastatin (LIPITOR) 40 MG tablet Take 1 tablet (40 mg total) by mouth daily at 6 PM. 90 tablet 3  . b complex vitamins tablet Take 1 tablet by mouth daily.    . Ca Carbonate-Mag Hydroxide (ROLAIDS PO) Take 1-2 tablets by mouth every 8 (eight) hours as needed (for acid reflux).     . Cholecalciferol (VITAMIN D) 2000 units tablet Take 2,000 Units by mouth daily.    Marland Kitchen  famotidine (PEPCID) 20 MG tablet Take 1 tablet (20 mg total) by mouth 2 (two) times daily. (Patient taking differently: Take 20 mg by mouth 2 (two) times daily as needed (for heartburn/indigestion.). ) 60 tablet 0  . gabapentin (NEURONTIN) 100 MG capsule Take 1 capsule (100 mg total) by mouth 2 (two) times daily as needed. (Patient taking differently: Take 100 mg by mouth 2 (two) times daily as needed (for nerve pain). ) 30 capsule 1  . lactose free nutrition (BOOST) LIQD Take 237 mLs by mouth daily.    Marland Kitchen lidocaine-prilocaine (EMLA) cream Apply to affected area once (Patient taking differently: Apply 1 application topically daily as needed (for port access.). Apply to affected area once) 30 g 3  . Multiple Vitamin  (MULTIVITAMIN WITH MINERALS) TABS tablet Take 1 tablet by mouth daily.    . ondansetron (ZOFRAN) 8 MG tablet Take 1 tablet (8 mg total) by mouth 2 (two) times daily as needed for refractory nausea / vomiting. Start on day 3 after chemotherapy. 30 tablet 1  . polyethylene glycol (MIRALAX / GLYCOLAX) packet Take 17 g by mouth daily. (Patient taking differently: Take 17 g by mouth daily as needed (for constipation.). ) 14 each 0  . prochlorperazine (COMPAZINE) 10 MG tablet Take 1 tablet (10 mg total) by mouth every 6 (six) hours as needed (Nausea or vomiting). 30 tablet 1   No current facility-administered medications for this visit.    Facility-Administered Medications Ordered in Other Visits  Medication Dose Route Frequency Provider Last Rate Last Dose  . dexamethasone (DECADRON) injection 10 mg  10 mg Intravenous Once [name], MD      . dextrose 5 % solution   Intravenous Once [name], MD      . fluorouracil (ADRUCIL) 4,150 mg in sodium chloride 0.9 % 67 mL chemo infusion  2,400 mg/m2 (Treatment Plan Recorded) Intravenous 1 day or 1 dose [name], MD      . leucovorin 688 mg in dextrose 5 % 250 mL infusion  400 mg/m2 (Treatment Plan Recorded) Intravenous Once [name], MD      . oxaliplatin (ELOXATIN) 145 mg in dextrose 5 % 500 mL chemo infusion  85 mg/m2 (Treatment Plan Recorded) Intravenous Once [name], MD      . palonosetron (ALOXI) injection 0.25 mg  0.25 mg Intravenous Once [name], MD        PHYSICAL EXAMINATION: ECOG PERFORMANCE STATUS: 1 - Symptomatic but completely ambulatory  Vitals:   02/07/17 1114  BP: (!) 142/90  Pulse: 85  Resp: 18  Temp: 98.5 F (36.9 C)  SpO2: 100%   Filed Weights   02/07/17 1114  Weight: 150 lb (68 kg)    GENERAL:alert, no distress and comfortable SKIN: skin color, texture, turgor are normal, no rashes or significant lesions EYES: normal, conjunctiva are pink and non-injected, sclera clear OROPHARYNX:no exudate, no erythema and lips,  buccal mucosa, and tongue normal  NECK: supple, thyroid normal size, non-tender, without nodularity LYMPH:  no palpable cervical, supraclavicular, or axillary lymphadenopathy LUNGS: clear to auscultation bilaterally with normal breathing effort HEART: regular rate & rhythm and no murmurs and no lower extremity edema ABDOMEN:abdomen soft, non-tender and normal bowel sounds. No palpable hepatomegaly (+) healed midline incision  Musculoskeletal:no cyanosis of digits and no clubbing  NEURO: alert & oriented x 3 with fluent speech, no focal motor/sensory deficits PAC without erythema.   LABORATORY DATA:  I have reviewed the data as listed CBC Latest Ref Rng & Units  02/07/2017 01/17/2017 12/29/2016  WBC 3.9 - 10.3 10e3/uL 4.5 4.0 6.3  Hemoglobin 11.6 - 15.9 g/dL 11.3(L) 10.0(L) 9.3 Repeated and Verified(L)  Hematocrit 34.8 - 46.6 % 35.4 32.6(L) 29.2(L)  Platelets 145 - 400 10e3/uL 303 290 249     CMP Latest Ref Rng & Units 02/07/2017 01/17/2017 12/29/2016  Glucose 70 - 140 mg/dl 89 118 113  BUN 7.0 - 26.0 mg/dL 17.5 13.6 13.8  Creatinine 0.6 - 1.1 mg/dL 0.9 1.0 0.8  Sodium 136 - 145 mEq/L 138 141 140  Potassium 3.5 - 5.1 mEq/L 4.2 4.0 3.7  Chloride 96 - 112 mEq/L - - -  CO2 22 - 29 mEq/L _0 Calcium 8.4 - 10.4 mg/dL 9.4 9.2 8.9  Total Protein 6.4 - 8.3 g/dL 7.1 6.7 6.6  Total Bilirubin 0.20 - 1.20 mg/dL 0.42 0.41 0.43  Alkaline Phos 40 - 150 U/L 90 88 73  AST 5 - 34 U/L _1 ALT 0 - 55 U/L _2 CEA 08/31/16: 2.0   PATHOLOGY REPORT:    Diagnosis 11/10/16 Colon, segmental resection for tumor, Ascending and Transverse ADENOCARCINOMA OF THE ASCENDING COLON WITH EXTRA CELLULAR MUCIN, GRADE 3, SIZE 3.4 CM THE TUMOR INVADES MUSCULARIS PROPRIA POORLY DIFFERENTIATED ADENOCARCINOMA OF THE TRANSVERSE COLON, GRADE 4, SIZE 6.1 CM THE TUMOR INVADES THROUGH MUSCULARIS PROPRIA INTO PERICOLONIC SOFT TISSUE EXTRAMURAL SATELLITE TUMOR NODULES IS IDENTIFIED (X1) ALL MARGINS OF  RESECTION ARE NEGATIVE FOR CARCINOMA METASTATIC CARCINOMA IN ONE OF SIXTY LYMPH NODES (1/60) TABULAR ADENOMA X1 Microscopic Comment COLON AND RECTUM (INCLUDING TRANS-ANAL RESECTION): Specimen: Right and transverse colon Procedure: Segmental resection Tumor site: Ascending and transverse colon Specimen integrity: Intact Macroscopic intactness of mesorectum: Not applicable: X Complete: NA Near complete: NA Incomplete: NA Cannot be determined (specify): NA Macroscopic tumor perforation: Pericolonic soft tissue Invasive tumor: Maximum size: 6.1 cm and 3.4 cm Histologic type(s): Adenocarcinoma Histologic grade and differentiation: G1: well differentiated/low grade Microscopic Comment(continued) G2: moderately differentiated/low grade G3: poorly differentiated/high grade G4: undifferentiated/high grade Type of polyp in which invasive carcinoma arose: Tubular adenoma Microscopic extension of invasive tumor: Pericolonic soft tissue Lymph-Vascular invasion: Identified Peri-neural invasion: Negative Tumor deposit(s) (discontinuous extramural extension): Present Resection margins: Proximal margin: Negative Distal margin: Negative Circumferential (radial) (posterior ascending, posterior descending; lateral and posterior mid-rectum; and entire lower 1/3 rectum): Negative Mesenteric margin (sigmoid and transverse): Negative Distance closest margin (if all above margins negative): 1.5 cm from the mesenteric margin Trans-anal resection margins only: Deep margin: NA Mucosal Margin: NA Distance closest mucosal margin (if negative): NA Treatment effect (neo-adjuvant therapy): Negative Additional polyp(s): Tubular adenoma Non-neoplastic findings: Unremarkble Lymph nodes: number examined 60; number positive: 1 Pathologic Staging: pT3 (m), pN1c, pMx Ancillary studies: Ordered    Colon biopsy, 08/31/16 Diagnosis 1. Surgical [P], hepatic flexure - ADENOCARCINOMA. Moderately  differentiated.  2. Surgical [P], transverse - ADENOCARCINOMA, Moderately differentiated.  RADIOGRAPHIC STUDIES: I have personally reviewed the radiological images as listed and agreed with the findings in the report.  ImagingResults  Mr Abdomen W Wo Contrast  Result Date: 12/28/2016 CLINICAL DATA:  Metastatic colorectal adenocarcinoma EXAM: MRI ABDOMEN WITHOUT AND WITH CONTRAST TECHNIQUE: Multiplanar multisequence MR imaging of the abdomen was performed both before and after the administration of intravenous contrast. CONTRAST:  38m MULTIHANCE GADOBENATE DIMEGLUMINE 529 MG/ML IV SOLN COMPARISON:  PET-CT dated 10/04/2016.  MRI abdomen dated 09/15/2016. FINDINGS: Motion degraded images. Lower chest: Lung bases are clear. Hepatobiliary: 1.6 cm T2 hyperintense lesion inferiorly in segment 6 (series  4/ image 33), previously 1.4 cm. Additional 9 mm lesion with peripheral rim enhancement in segment 8 (series 1202/ image 48), previously 1.3 cm. Both lesions are grossly unchanged and considered indeterminate. The caudate lesion on CT is not well visualized on MRI. Status post cholecystectomy. No intrahepatic or extrahepatic duct dilatation. Pancreas:  Within normal limits. Spleen:  Within normal limits. Adrenals/Urinary Tract:  Adrenal glands are within normal limits. Kidneys are notable for left renal sinus cysts and right renal cortical cysts, without enhancement. No hydronephrosis. Stomach/Bowel: Stomach is within normal limits. Postsurgical changes related to right colon resection. Vascular/Lymphatic:  No evidence of abdominal aortic aneurysm. Two periportal nodes measuring up to 1.9 cm short axis (series 4/image 26), suspicious for nodal metastases. Other:  No abdominal ascites. Musculoskeletal: No focal osseous lesions. IMPRESSION: Motion degraded images. Two indeterminate right liver lesions measuring up to 1.6 cm, as described above. Continued attention on follow-up is suggested. Two periportal lymph  nodes measuring up to 1.9 cm, suspicious for nodal metastases. Postsurgical changes related to right colon resection. Electronically Signed   By: Julian Hy M.D.   On: 12/28/2016 13:23   Dg Chest Port 1 View  Result Date: 12/22/2016 CLINICAL DATA:  Status post Port-A-Cath placement. EXAM: PORTABLE CHEST 1 VIEW COMPARISON:  Radiograph of September 11, 2010. FINDINGS: Stable cardiomediastinal silhouette. Interval placement of left subclavian Port-A-Cath with distal tip in expected position of SVC. No pulmonary consolidation is noted. No pleural effusion is noted. Curvilinear density is seen overlying left lung apex ; is uncertain if this represents minimal left apical pneumothorax or overlying soft tissue artifact. Bony thorax is unremarkable. IMPRESSION: Interval placement of left subclavian Port-A-Cath with distal tip in expected position of SVC. Curvilinear density seen overlying left lung apex semi it is uncertain if this represents minimal left apical pneumothorax or overlying soft tissue artifact. Followup radiographs are recommended. These results will be called to the ordering clinician or representative by the Radiologist Assistant, and communication documented in the PACS or zVision Dashboard. Electronically Signed   By: Marijo Conception, M.D.   On: 12/22/2016 16:16   Dg Fluoro Guide Cv Line-no Report  Result Date: 12/22/2016 Fluoroscopy was utilized by the requesting physician.  No radiographic interpretation.     PROCEDURES   COLONOSCOPY 08/31/2016 DR. JACOBS  - 1) Polypoid mass at the hepatic flexure, 2cm, soft but clearly neoplastic. This was biopsied and the site was injected with Spot. - 2) Clearly malignant mass in the distal transverse colon; 4cm, firm, 1/2 cirumference, ulcerated. This wsa biopsied and the site was also injected with Spot. - 3) Four small (3-23m) scattered typical appearing adenomas throughout the colon, not removed since she mistakenly stayed on her  plavix for this procedure.  ASSESSMENT & PLAN:   [name] is a 76y.o. caucasian female with colorectal adenocarcinoma, moderately differentiated, found on screening colonoscopy.   1. Synchronized colon adenocarcinoma, moderately differentiated, in hepatic fracture and distal transverse colon, pT3(m)N1cMx, at least stage IIIB, MSI-H 2. Genetics  3. Anemia, iron deficient  4. Liver lesions 5. History of stroke 6. ? HTN 7. Sinus drainage; ear fullness; positional dizziness/vertigo    8. Paresthesias - fingers and feet   Ms. GRahmingappears stable today. She completed 2 cycles FOLFOX, tolerated well overall. She had nausea with vomiting episode beginning on day 3 with pump d/c. zofran did not help. I recommend she take compazine day before and day of pump d/c and PRN thereafter. If this does not improve,  can add emend with next cycle. We discussed bowel regimen to prevent diarrhea and constipation; she will not take PRN meds if her stools are regular in frequency and soft. She will use imodium if they become watery and miralax plus eat prunes if she becomes constipated to maintain BM q1-2 days. She will use lactulose if no BM in more than 2 days. I discouraged her from suppository and manual disimpaction during chemo as her counts may drop and lead to bleeding or infection. For her cold/URI symptoms she will continue OTC cold remedies, these likely contribute to elevated BP today. She reports BP is normal at home. Continue Benadryl PRN at night to help her sleep. Positional vertigo, secondary to URI and ear drainage. I suggest meclizine for dizziness, cautioned her against using this with benadryl as side effects may intensify. I do not suspect paresthesia to her feet is caused by chemotherapy, as was present prior to treatment; but may be exacerbated by oxaliplatin. Will continue to monitor.   Overall she is doing well. We discussed EUS LN biopsy report was negative for malignancy.  Will continue adjuvant FOLFOX q2 weeks for 3 months, then likely will repeat imaging to ensure resolution of the enlarged nodes. Labs reviewed, adequate to proceed with treatment today and q2 weeks. Anemia is improving, Hgb 11.3. She will get 2nd dose of feraheme today, Ferritin improved to 195 today after first dose.   Her colon cancer is MSI-H, she has been referred to genetic counselor to r/o lynch syndrome; however, she missed appointment last week when weather was bad, I sent a message to request rescheduling.   Return in 2 weeks for f/u and cycle 4.   PLAN -Compazine day before and day of pump d/c, then PRN thereafter  -Meclizine PRN for positional vertigo, benadryl PRN for sleep; avoid concomitant use  -Imodium for diarrhea; miralax, prunes, and lactulose PRN for constipation -Labs reviewed, proceed with cycle 3 FOLFOX today, continue q2 weeks  -Return in 2 weeks for next cycle.   All questions were answered. The patient knows to call the clinic with any problems, questions or concerns. No barriers to learning was detected.     Alla Feeling, NP 02/07/17

## 2017-02-07 ENCOUNTER — Ambulatory Visit: Payer: Medicare HMO

## 2017-02-07 ENCOUNTER — Telehealth: Payer: Self-pay | Admitting: Nurse Practitioner

## 2017-02-07 ENCOUNTER — Ambulatory Visit (HOSPITAL_BASED_OUTPATIENT_CLINIC_OR_DEPARTMENT_OTHER): Payer: Medicare HMO | Admitting: Nurse Practitioner

## 2017-02-07 ENCOUNTER — Ambulatory Visit (HOSPITAL_BASED_OUTPATIENT_CLINIC_OR_DEPARTMENT_OTHER): Payer: Medicare HMO

## 2017-02-07 ENCOUNTER — Encounter: Payer: Self-pay | Admitting: Nurse Practitioner

## 2017-02-07 ENCOUNTER — Other Ambulatory Visit (HOSPITAL_BASED_OUTPATIENT_CLINIC_OR_DEPARTMENT_OTHER): Payer: Medicare HMO

## 2017-02-07 VITALS — BP 142/90 | HR 85 | Temp 98.5°F | Resp 18 | Ht 64.0 in | Wt 150.0 lb

## 2017-02-07 VITALS — BP 127/72

## 2017-02-07 DIAGNOSIS — I10 Essential (primary) hypertension: Secondary | ICD-10-CM

## 2017-02-07 DIAGNOSIS — D509 Iron deficiency anemia, unspecified: Secondary | ICD-10-CM

## 2017-02-07 DIAGNOSIS — C188 Malignant neoplasm of overlapping sites of colon: Secondary | ICD-10-CM

## 2017-02-07 DIAGNOSIS — R209 Unspecified disturbances of skin sensation: Secondary | ICD-10-CM | POA: Diagnosis not present

## 2017-02-07 DIAGNOSIS — K769 Liver disease, unspecified: Secondary | ICD-10-CM

## 2017-02-07 DIAGNOSIS — Z5111 Encounter for antineoplastic chemotherapy: Secondary | ICD-10-CM

## 2017-02-07 DIAGNOSIS — C787 Secondary malignant neoplasm of liver and intrahepatic bile duct: Principal | ICD-10-CM

## 2017-02-07 DIAGNOSIS — Z862 Personal history of diseases of the blood and blood-forming organs and certain disorders involving the immune mechanism: Secondary | ICD-10-CM

## 2017-02-07 DIAGNOSIS — D508 Other iron deficiency anemias: Secondary | ICD-10-CM

## 2017-02-07 DIAGNOSIS — C189 Malignant neoplasm of colon, unspecified: Secondary | ICD-10-CM

## 2017-02-07 DIAGNOSIS — D500 Iron deficiency anemia secondary to blood loss (chronic): Secondary | ICD-10-CM

## 2017-02-07 DIAGNOSIS — H8110 Benign paroxysmal vertigo, unspecified ear: Secondary | ICD-10-CM

## 2017-02-07 LAB — COMPREHENSIVE METABOLIC PANEL
ALT: 26 U/L (ref 0–55)
AST: 20 U/L (ref 5–34)
Albumin: 3.7 g/dL (ref 3.5–5.0)
Alkaline Phosphatase: 90 U/L (ref 40–150)
Anion Gap: 8 mEq/L (ref 3–11)
BUN: 17.5 mg/dL (ref 7.0–26.0)
CO2: 23 mEq/L (ref 22–29)
Calcium: 9.4 mg/dL (ref 8.4–10.4)
Chloride: 107 mEq/L (ref 98–109)
Creatinine: 0.9 mg/dL (ref 0.6–1.1)
EGFR: >60 mL/min/{1.73_m2} (ref >60)
Glucose: 89 mg/dl (ref 70–140)
Potassium: 4.2 mEq/L (ref 3.5–5.1)
Sodium: 138 mEq/L (ref 136–145)
Total Bilirubin: 0.42 mg/dL (ref 0.20–1.20)
Total Protein: 7.1 g/dL (ref 6.4–8.3)

## 2017-02-07 LAB — CBC & DIFF AND RETIC
BASO%: 0.9 % (ref 0.0–2.0)
Basophils Absolute: 0 10*3/uL (ref 0.0–0.1)
EOS%: 2 % (ref 0.0–7.0)
Eosinophils Absolute: 0.1 10*3/uL (ref 0.0–0.5)
HCT: 35.4 % (ref 34.8–46.6)
HGB: 11.3 g/dL — ABNORMAL LOW (ref 11.6–15.9)
Immature Retic Fract: 7.7 % (ref 1.60–10.00)
LYMPH%: 40.5 % (ref 14.0–49.7)
MCH: 28.5 pg (ref 25.1–34.0)
MCHC: 31.9 g/dL (ref 31.5–36.0)
MCV: 89.4 fL (ref 79.5–101.0)
MONO#: 0.8 10*3/uL (ref 0.1–0.9)
MONO%: 18.6 % — ABNORMAL HIGH (ref 0.0–14.0)
NEUT#: 1.7 10*3/uL (ref 1.5–6.5)
NEUT%: 38 % — ABNORMAL LOW (ref 38.4–76.8)
Platelets: 303 10*3/uL (ref 145–400)
RBC: 3.96 10*6/uL (ref 3.70–5.45)
RDW: 18.7 % — ABNORMAL HIGH (ref 11.2–14.5)
Retic %: 3.62 % — ABNORMAL HIGH (ref 0.70–2.10)
Retic Ct Abs: 143.35 10*3/uL — ABNORMAL HIGH (ref 33.70–90.70)
WBC: 4.5 10*3/uL (ref 3.9–10.3)
lymph#: 1.8 10*3/uL (ref 0.9–3.3)

## 2017-02-07 LAB — FERRITIN: Ferritin: 195 ng/ml (ref 9–269)

## 2017-02-07 MED ORDER — DEXTROSE 5 % IV SOLN
400.0000 mg/m2 | Freq: Once | INTRAVENOUS | Status: AC
  Administered 2017-02-07: 688 mg via INTRAVENOUS
  Filled 2017-02-07: qty 34.4

## 2017-02-07 MED ORDER — SODIUM CHLORIDE 0.9% FLUSH
10.0000 mL | Freq: Once | INTRAVENOUS | Status: AC
  Administered 2017-02-07: 10 mL
  Filled 2017-02-07: qty 10

## 2017-02-07 MED ORDER — SODIUM CHLORIDE 0.9 % IV SOLN
2400.0000 mg/m2 | INTRAVENOUS | Status: DC
  Administered 2017-02-07: 4150 mg via INTRAVENOUS
  Filled 2017-02-07: qty 83

## 2017-02-07 MED ORDER — SODIUM CHLORIDE 0.9 % IV SOLN
510.0000 mg | Freq: Once | INTRAVENOUS | Status: AC
  Administered 2017-02-07: 510 mg via INTRAVENOUS
  Filled 2017-02-07: qty 17

## 2017-02-07 MED ORDER — PALONOSETRON HCL INJECTION 0.25 MG/5ML
INTRAVENOUS | Status: AC
  Filled 2017-02-07: qty 5

## 2017-02-07 MED ORDER — DEXTROSE 5 % IV SOLN
Freq: Once | INTRAVENOUS | Status: AC
  Administered 2017-02-07: 13:00:00 via INTRAVENOUS

## 2017-02-07 MED ORDER — PALONOSETRON HCL INJECTION 0.25 MG/5ML
0.2500 mg | Freq: Once | INTRAVENOUS | Status: AC
  Administered 2017-02-07: 0.25 mg via INTRAVENOUS

## 2017-02-07 MED ORDER — DEXAMETHASONE SODIUM PHOSPHATE 10 MG/ML IJ SOLN
INTRAMUSCULAR | Status: AC
  Filled 2017-02-07: qty 1

## 2017-02-07 MED ORDER — DEXAMETHASONE SODIUM PHOSPHATE 10 MG/ML IJ SOLN
10.0000 mg | Freq: Once | INTRAMUSCULAR | Status: AC
  Administered 2017-02-07: 10 mg via INTRAVENOUS

## 2017-02-07 MED ORDER — OXALIPLATIN CHEMO INJECTION 100 MG/20ML
85.0000 mg/m2 | Freq: Once | INTRAVENOUS | Status: AC
  Administered 2017-02-07: 145 mg via INTRAVENOUS
  Filled 2017-02-07: qty 9

## 2017-02-07 NOTE — Patient Instructions (Signed)
[facility] Discharge Instructions for Patients Receiving Chemotherapy  Today you received the following chemotherapy agents:  Oxaliplatin, Leucovorin, and 5FU.  To help prevent nausea and vomiting after your treatment, we encourage you to take your nausea medication as directed.   If you develop nausea and vomiting that is not controlled by your nausea medication, call the clinic.   BELOW ARE SYMPTOMS THAT SHOULD BE REPORTED IMMEDIATELY:  *FEVER GREATER THAN 100.5 F  *CHILLS WITH OR WITHOUT FEVER  NAUSEA AND VOMITING THAT IS NOT CONTROLLED WITH YOUR NAUSEA MEDICATION  *UNUSUAL SHORTNESS OF BREATH  *UNUSUAL BRUISING OR BLEEDING  TENDERNESS IN MOUTH AND THROAT WITH OR WITHOUT PRESENCE OF ULCERS  *URINARY PROBLEMS  *BOWEL PROBLEMS  UNUSUAL RASH Items with * indicate a potential emergency and should be followed up as soon as possible.  Feel free to call the clinic should you have any questions or concerns. The clinic phone number is [phone].  Please show the [place] at check-in to the Emergency Department and triage nurse.  Ferumoxytol injection What is this medicine? FERUMOXYTOL is an iron complex. Iron is used to make healthy red blood cells, which carry oxygen and nutrients throughout the body. This medicine is used to treat iron deficiency anemia in people with chronic kidney disease. This medicine may be used for other purposes; ask your health care provider or pharmacist if you have questions. COMMON BRAND NAME(S): Feraheme What should I tell my health care provider before I take this medicine? They need to know if you have any of these conditions: -anemia not caused by low iron levels -high levels of iron in the blood -magnetic resonance imaging (MRI) test scheduled -an unusual or allergic reaction to iron, other medicines, foods, dyes, or preservatives -pregnant or trying to get pregnant -breast-feeding How should I use this  medicine? This medicine is for injection into a vein. It is given by a health care professional in a hospital or clinic setting. Talk to your pediatrician regarding the use of this medicine in children. Special care may be needed. Overdosage: If you think you have taken too much of this medicine contact a poison control center or emergency room at once. NOTE: This medicine is only for you. Do not share this medicine with others. What if I miss a dose? It is important not to miss your dose. Call your doctor or health care professional if you are unable to keep an appointment. What may interact with this medicine? This medicine may interact with the following medications: -other iron products This list may not describe all possible interactions. Give your health care provider a list of all the medicines, herbs, non-prescription drugs, or dietary supplements you use. Also tell them if you smoke, drink alcohol, or use illegal drugs. Some items may interact with your medicine. What should I watch for while using this medicine? Visit your doctor or healthcare professional regularly. Tell your doctor or healthcare professional if your symptoms do not start to get better or if they get worse. You may need blood work done while you are taking this medicine. You may need to follow a special diet. Talk to your doctor. Foods that contain iron include: whole grains/cereals, dried fruits, beans, or peas, leafy green vegetables, and organ meats (liver, kidney). What side effects may I notice from receiving this medicine? Side effects that you should report to your doctor or health care professional as soon as possible: -allergic reactions like skin rash, itching or hives,  swelling of the face, lips, or tongue -breathing problems -changes in blood pressure -feeling faint or lightheaded, falls -fever or chills -flushing, sweating, or hot feelings -swelling of the ankles or feet Side effects that usually do not  require medical attention (report to your doctor or health care professional if they continue or are bothersome): -diarrhea -headache -nausea, vomiting -stomach pain This list may not describe all possible side effects. Call your doctor for medical advice about side effects. You may report side effects to FDA at 1-800-FDA-1088. Where should I keep my medicine? This drug is given in a hospital or clinic and will not be stored at home. NOTE: This sheet is a summary. It may not cover all possible information. If you have questions about this medicine, talk to your doctor, pharmacist, or health care provider.  2018 Elsevier/Gold Standard (2015-03-13 12:41:49)

## 2017-02-07 NOTE — Telephone Encounter (Signed)
Scheduled appt per 12/17 los - Gave patient AVS and calender per los.  

## 2017-02-09 ENCOUNTER — Ambulatory Visit (HOSPITAL_BASED_OUTPATIENT_CLINIC_OR_DEPARTMENT_OTHER): Payer: Medicare HMO

## 2017-02-09 VITALS — BP 145/82 | HR 80 | Temp 98.6°F | Resp 18

## 2017-02-09 DIAGNOSIS — Z452 Encounter for adjustment and management of vascular access device: Secondary | ICD-10-CM | POA: Diagnosis not present

## 2017-02-09 DIAGNOSIS — C188 Malignant neoplasm of overlapping sites of colon: Secondary | ICD-10-CM | POA: Diagnosis not present

## 2017-02-09 MED ORDER — SODIUM CHLORIDE 0.9% FLUSH
10.0000 mL | INTRAVENOUS | Status: DC | PRN
  Administered 2017-02-09: 10 mL
  Filled 2017-02-09: qty 10

## 2017-02-09 MED ORDER — HEPARIN SOD (PORK) LOCK FLUSH 100 UNIT/ML IV SOLN
500.0000 [IU] | Freq: Once | INTRAVENOUS | Status: AC | PRN
  Administered 2017-02-09: 500 [IU]
  Filled 2017-02-09: qty 5

## 2017-02-09 NOTE — Patient Instructions (Signed)
Implanted Port Home Guide An implanted port is a type of central line that is placed under the skin. Central lines are used to provide IV access when treatment or nutrition needs to be given through a person's veins. Implanted ports are used for long-term IV access. An implanted port may be placed because:  You need IV medicine that would be irritating to the small veins in your hands or arms.  You need long-term IV medicines, such as antibiotics.  You need IV nutrition for a long period.  You need frequent blood draws for lab tests.  You need dialysis.  Implanted ports are usually placed in the chest area, but they can also be placed in the upper arm, the abdomen, or the leg. An implanted port has two main parts:  Reservoir. The reservoir is round and will appear as a small, raised area under your skin. The reservoir is the part where a needle is inserted to give medicines or draw blood.  Catheter. The catheter is a thin, flexible tube that extends from the reservoir. The catheter is placed into a large vein. Medicine that is inserted into the reservoir goes into the catheter and then into the vein.  How will I care for my incision site? Do not get the incision site wet. Bathe or shower as directed by your health care provider. How is my port accessed? Special steps must be taken to access the port:  Before the port is accessed, a numbing cream can be placed on the skin. This helps numb the skin over the port site.  Your health care provider uses a sterile technique to access the port. ? Your health care provider must put on a mask and sterile gloves. ? The skin over your port is cleaned carefully with an antiseptic and allowed to dry. ? The port is gently pinched between sterile gloves, and a needle is inserted into the port.  Only "non-coring" port needles should be used to access the port. Once the port is accessed, a blood return should be checked. This helps ensure that the port  is in the vein and is not clogged.  If your port needs to remain accessed for a constant infusion, a clear (transparent) bandage will be placed over the needle site. The bandage and needle will need to be changed every week, or as directed by your health care provider.  Keep the bandage covering the needle clean and dry. Do not get it wet. Follow your health care provider's instructions on how to take a shower or bath while the port is accessed.  If your port does not need to stay accessed, no bandage is needed over the port.  What is flushing? Flushing helps keep the port from getting clogged. Follow your health care provider's instructions on how and when to flush the port. Ports are usually flushed with saline solution or a medicine called heparin. The need for flushing will depend on how the port is used.  If the port is used for intermittent medicines or blood draws, the port will need to be flushed: ? After medicines have been given. ? After blood has been drawn. ? As part of routine maintenance.  If a constant infusion is running, the port may not need to be flushed.  How long will my port stay implanted? The port can stay in for as long as your health care provider thinks it is needed. When it is time for the port to come out, surgery will be   done to remove it. The procedure is similar to the one performed when the port was put in. When should I seek immediate medical care? When you have an implanted port, you should seek immediate medical care if:  You notice a bad smell coming from the incision site.  You have swelling, redness, or drainage at the incision site.  You have more swelling or pain at the port site or the surrounding area.  You have a fever that is not controlled with medicine.  This information is not intended to replace advice given to you by your health care provider. Make sure you discuss any questions you have with your health care provider. Document  Released: 02/08/2005 Document Revised: 07/17/2015 Document Reviewed: 10/16/2012 Elsevier Interactive Patient Education  2017 [employer]  

## 2017-02-10 DIAGNOSIS — D509 Iron deficiency anemia, unspecified: Secondary | ICD-10-CM | POA: Diagnosis not present

## 2017-02-10 DIAGNOSIS — I82422 Acute embolism and thrombosis of left iliac vein: Secondary | ICD-10-CM | POA: Diagnosis not present

## 2017-02-10 DIAGNOSIS — Z483 Aftercare following surgery for neoplasm: Secondary | ICD-10-CM | POA: Diagnosis not present

## 2017-02-10 DIAGNOSIS — K589 Irritable bowel syndrome without diarrhea: Secondary | ICD-10-CM | POA: Diagnosis not present

## 2017-02-10 DIAGNOSIS — C188 Malignant neoplasm of overlapping sites of colon: Secondary | ICD-10-CM | POA: Diagnosis not present

## 2017-02-10 DIAGNOSIS — I10 Essential (primary) hypertension: Secondary | ICD-10-CM | POA: Diagnosis not present

## 2017-02-10 DIAGNOSIS — E559 Vitamin D deficiency, unspecified: Secondary | ICD-10-CM | POA: Diagnosis not present

## 2017-02-10 DIAGNOSIS — M6281 Muscle weakness (generalized): Secondary | ICD-10-CM | POA: Diagnosis not present

## 2017-02-10 DIAGNOSIS — M810 Age-related osteoporosis without current pathological fracture: Secondary | ICD-10-CM | POA: Diagnosis not present

## 2017-02-10 DIAGNOSIS — I2510 Atherosclerotic heart disease of native coronary artery without angina pectoris: Secondary | ICD-10-CM | POA: Diagnosis not present

## 2017-02-11 ENCOUNTER — Telehealth: Payer: Self-pay | Admitting: Genetics

## 2017-02-11 ENCOUNTER — Telehealth: Payer: Self-pay | Admitting: Family Medicine

## 2017-02-11 DIAGNOSIS — M6281 Muscle weakness (generalized): Secondary | ICD-10-CM | POA: Diagnosis not present

## 2017-02-11 DIAGNOSIS — I82422 Acute embolism and thrombosis of left iliac vein: Secondary | ICD-10-CM | POA: Diagnosis not present

## 2017-02-11 DIAGNOSIS — C188 Malignant neoplasm of overlapping sites of colon: Secondary | ICD-10-CM | POA: Diagnosis not present

## 2017-02-11 NOTE — Telephone Encounter (Signed)
Called to reschedule genetics appointment- this was scheduled for Feb 23, 2017 at 2:00PM

## 2017-02-11 NOTE — Telephone Encounter (Signed)
[name] is calling to let the office know that the patient was unable to complete one of her PT appointments this week due to illness related to her chemo treatment. She was supposed to have PT - W/F- but it was rescheduled to TH/F due to patient not feeling well. She was able to do [facility]- but could not do the F appointment because she was too ill.

## 2017-02-11 NOTE — Telephone Encounter (Signed)
Aware, thanks!

## 2017-02-16 ENCOUNTER — Other Ambulatory Visit: Payer: Medicare HMO

## 2017-02-16 ENCOUNTER — Ambulatory Visit: Payer: Medicare HMO

## 2017-02-16 ENCOUNTER — Ambulatory Visit: Payer: Medicare HMO | Admitting: Nurse Practitioner

## 2017-02-16 DIAGNOSIS — E559 Vitamin D deficiency, unspecified: Secondary | ICD-10-CM | POA: Diagnosis not present

## 2017-02-16 DIAGNOSIS — M6281 Muscle weakness (generalized): Secondary | ICD-10-CM | POA: Diagnosis not present

## 2017-02-16 DIAGNOSIS — I10 Essential (primary) hypertension: Secondary | ICD-10-CM | POA: Diagnosis not present

## 2017-02-16 DIAGNOSIS — Z483 Aftercare following surgery for neoplasm: Secondary | ICD-10-CM | POA: Diagnosis not present

## 2017-02-16 DIAGNOSIS — I2510 Atherosclerotic heart disease of native coronary artery without angina pectoris: Secondary | ICD-10-CM | POA: Diagnosis not present

## 2017-02-16 DIAGNOSIS — K589 Irritable bowel syndrome without diarrhea: Secondary | ICD-10-CM | POA: Diagnosis not present

## 2017-02-16 DIAGNOSIS — C188 Malignant neoplasm of overlapping sites of colon: Secondary | ICD-10-CM | POA: Diagnosis not present

## 2017-02-16 DIAGNOSIS — D509 Iron deficiency anemia, unspecified: Secondary | ICD-10-CM | POA: Diagnosis not present

## 2017-02-16 DIAGNOSIS — I82422 Acute embolism and thrombosis of left iliac vein: Secondary | ICD-10-CM | POA: Diagnosis not present

## 2017-02-16 DIAGNOSIS — M810 Age-related osteoporosis without current pathological fracture: Secondary | ICD-10-CM | POA: Diagnosis not present

## 2017-02-20 NOTE — Progress Notes (Signed)
error 

## 2017-02-21 ENCOUNTER — Ambulatory Visit (HOSPITAL_BASED_OUTPATIENT_CLINIC_OR_DEPARTMENT_OTHER): Payer: Medicare HMO | Admitting: Adult Health

## 2017-02-21 ENCOUNTER — Other Ambulatory Visit (HOSPITAL_BASED_OUTPATIENT_CLINIC_OR_DEPARTMENT_OTHER): Payer: Medicare HMO

## 2017-02-21 ENCOUNTER — Other Ambulatory Visit: Payer: Self-pay

## 2017-02-21 ENCOUNTER — Encounter: Payer: Self-pay | Admitting: Adult Health

## 2017-02-21 ENCOUNTER — Ambulatory Visit (HOSPITAL_BASED_OUTPATIENT_CLINIC_OR_DEPARTMENT_OTHER): Payer: Medicare HMO

## 2017-02-21 ENCOUNTER — Ambulatory Visit: Payer: Medicare HMO

## 2017-02-21 VITALS — BP 157/64 | HR 79 | Temp 98.5°F | Resp 20 | Ht 64.0 in | Wt 150.3 lb

## 2017-02-21 DIAGNOSIS — C189 Malignant neoplasm of colon, unspecified: Secondary | ICD-10-CM

## 2017-02-21 DIAGNOSIS — C787 Secondary malignant neoplasm of liver and intrahepatic bile duct: Secondary | ICD-10-CM | POA: Diagnosis not present

## 2017-02-21 DIAGNOSIS — C188 Malignant neoplasm of overlapping sites of colon: Secondary | ICD-10-CM

## 2017-02-21 DIAGNOSIS — R0789 Other chest pain: Secondary | ICD-10-CM | POA: Diagnosis not present

## 2017-02-21 DIAGNOSIS — R258 Other abnormal involuntary movements: Secondary | ICD-10-CM

## 2017-02-21 DIAGNOSIS — I10 Essential (primary) hypertension: Secondary | ICD-10-CM

## 2017-02-21 DIAGNOSIS — Z5111 Encounter for antineoplastic chemotherapy: Secondary | ICD-10-CM

## 2017-02-21 LAB — CBC & DIFF AND RETIC
BASO%: 0.2 % (ref 0.0–2.0)
Basophils Absolute: 0 10*3/uL (ref 0.0–0.1)
EOS%: 4.1 % (ref 0.0–7.0)
Eosinophils Absolute: 0.2 10*3/uL (ref 0.0–0.5)
HCT: 35.5 % (ref 34.8–46.6)
HGB: 11.4 g/dL — ABNORMAL LOW (ref 11.6–15.9)
Immature Retic Fract: 4.9 % (ref 1.60–10.00)
LYMPH%: 27.9 % (ref 14.0–49.7)
MCH: 28.6 pg (ref 25.1–34.0)
MCHC: 32.1 g/dL (ref 31.5–36.0)
MCV: 89 fL (ref 79.5–101.0)
MONO#: 0.9 10*3/uL (ref 0.1–0.9)
MONO%: 19.1 % — ABNORMAL HIGH (ref 0.0–14.0)
NEUT#: 2.2 10*3/uL (ref 1.5–6.5)
NEUT%: 48.7 % (ref 38.4–76.8)
Platelets: 136 10*3/uL — ABNORMAL LOW (ref 145–400)
RBC: 3.99 10*6/uL (ref 3.70–5.45)
RDW: 18.4 % — ABNORMAL HIGH (ref 11.2–14.5)
Retic %: 2.4 % — ABNORMAL HIGH (ref 0.70–2.10)
Retic Ct Abs: 95.76 10*3/uL — ABNORMAL HIGH (ref 33.70–90.70)
WBC: 4.4 10*3/uL (ref 3.9–10.3)
lymph#: 1.2 10*3/uL (ref 0.9–3.3)
nRBC: 0 % (ref 0–0)

## 2017-02-21 LAB — COMPREHENSIVE METABOLIC PANEL WITH GFR
ALT: 32 U/L (ref 0–55)
AST: 29 U/L (ref 5–34)
Albumin: 3.4 g/dL — ABNORMAL LOW (ref 3.5–5.0)
Alkaline Phosphatase: 86 U/L (ref 40–150)
Anion Gap: 9 meq/L (ref 3–11)
BUN: 9.7 mg/dL (ref 7.0–26.0)
CO2: 24 meq/L (ref 22–29)
Calcium: 9.4 mg/dL (ref 8.4–10.4)
Chloride: 108 meq/L (ref 98–109)
Creatinine: 0.9 mg/dL (ref 0.6–1.1)
EGFR: >60 ml/min/1.73 m2
Glucose: 134 mg/dL (ref 70–140)
Potassium: 3.5 meq/L (ref 3.5–5.1)
Sodium: 141 meq/L (ref 136–145)
Total Bilirubin: 0.35 mg/dL (ref 0.20–1.20)
Total Protein: 6.5 g/dL (ref 6.4–8.3)

## 2017-02-21 MED ORDER — SODIUM CHLORIDE 0.9 % IV SOLN
2400.0000 mg/m2 | INTRAVENOUS | Status: DC
  Administered 2017-02-21: 4150 mg via INTRAVENOUS
  Filled 2017-02-21: qty 83

## 2017-02-21 MED ORDER — SODIUM CHLORIDE 0.9% FLUSH
10.0000 mL | Freq: Once | INTRAVENOUS | Status: AC
  Administered 2017-02-21: 10 mL
  Filled 2017-02-21: qty 10

## 2017-02-21 MED ORDER — OXALIPLATIN CHEMO INJECTION 100 MG/20ML
85.0000 mg/m2 | Freq: Once | INTRAVENOUS | Status: AC
  Administered 2017-02-21: 145 mg via INTRAVENOUS
  Filled 2017-02-21: qty 20

## 2017-02-21 MED ORDER — PALONOSETRON HCL INJECTION 0.25 MG/5ML
0.2500 mg | Freq: Once | INTRAVENOUS | Status: AC
  Administered 2017-02-21: 0.25 mg via INTRAVENOUS

## 2017-02-21 MED ORDER — PALONOSETRON HCL INJECTION 0.25 MG/5ML
INTRAVENOUS | Status: AC
  Filled 2017-02-21: qty 5

## 2017-02-21 MED ORDER — DEXAMETHASONE SODIUM PHOSPHATE 10 MG/ML IJ SOLN
10.0000 mg | Freq: Once | INTRAMUSCULAR | Status: AC
  Administered 2017-02-21: 10 mg via INTRAVENOUS

## 2017-02-21 MED ORDER — SODIUM CHLORIDE 0.9 % IV SOLN
Freq: Once | INTRAVENOUS | Status: AC
  Administered 2017-02-21: 15:00:00 via INTRAVENOUS
  Filled 2017-02-21: qty 5

## 2017-02-21 MED ORDER — DEXAMETHASONE SODIUM PHOSPHATE 10 MG/ML IJ SOLN
INTRAMUSCULAR | Status: AC
  Filled 2017-02-21: qty 1

## 2017-02-21 MED ORDER — LEUCOVORIN CALCIUM INJECTION 350 MG
400.0000 mg/m2 | Freq: Once | INTRAMUSCULAR | Status: AC
Start: 2017-02-21 — End: 2017-02-21
  Administered 2017-02-21: 688 mg via INTRAVENOUS
  Filled 2017-02-21: qty 34.4

## 2017-02-21 MED ORDER — DEXTROSE 5 % IV SOLN
Freq: Once | INTRAVENOUS | Status: AC
  Administered 2017-02-21: 13:00:00 via INTRAVENOUS

## 2017-02-21 NOTE — Progress Notes (Addendum)
[facility] Follow up:    [name], [name], MD [place] [address]   DIAGNOSIS: Cancer Staging Cancer of overlapping sites of colon [facility]) Staging form: Colon and Rectum, AJCC 8th Edition - Pathologic stage from 11/10/2016: Stage IIIB (pT3(m), pN1c, cM0) - Signed by [name], MD on 12/16/2016   SUMMARY OF ONCOLOGIC HISTORY:   Cancer of overlapping sites of colon ([place])   08/31/2016 Tumor Marker    Patient's tumor was tested for the following markers: CEA. Results of the tumor marker test revealed 2.0.      08/31/2016 Pathology Results    Colon biopsy Diagnosis: 1. Surgical [P], hepatic flexure. ADENOCARCINOMA. Moderately differentiated. 2. Surgical [P], transverse. ADENOCARCINOMA, Moderately differentiated.      08/31/2016 Initial Diagnosis    Colon cancer metastasized to liver ([place])      08/31/2016 Procedure    - 1) Polypoid mass at the hepatic flexure, 2cm, soft but clearly neoplastic.  - 2) Clearly malignant mass in the distal transverse colon; 4cm, firm, 1/2 cirumference, ulcerated. ' - 3) Four small (3-36mm) scattered typical appearing adenomas throughout the colon, notremoved since she mistakenly stayed on her plavix for this procedure.      09/01/2016 Imaging    CT Abdomen Pelvis W Contrast IMPRESSION: There are 2 colonic lesions involving the proximal ascending colon and proximal transverse colon, as detailed above, highly concerning for colonic neoplasm. In addition, there are at least 2 indeterminate liver lesions which are suspicious for potential metastatic disease.       09/16/2016 Imaging    Mr Liver W Wo Contrast IMPRESSION: Lesion within central right lobe of liver exhibits of peripheral enhancement and is suspicious for liver metastasis. Lesion within caudate lobe of liver identified on recent CT does not have a corresponding signal or enhancement abnormality on today's study. Within the inferior right lobe of liver there is  an enhancing structure which is favored to represent an atypical benign hemangioma with liver metastasis felt less likely. The transverse colon lesion is again identified compatible with colonic adenocarcinoma.      10/04/2016 PET scan    PET 10/04/16 IMPRESSION: 1. Highly hypermetabolic proximal transverse colon mass, maximum SUV 30.8. Small focus of hypermetabolic activity in the mesentery just above this mass probably represents a local involved lymph node. 2. Moderately hypermetabolic ascending colon mass, maximum SUV 8.7. 3. Highly hypermetabolic enlarged portacaval lymph node, maximum SUV 16.7. 4. Focus of hypermetabolic activity near the duodenum bulb could be physiologic or due to an adjacent lymph node. 5. None of the liver lesions are discernibly hypermetabolic. Particularly the more cephalad right hepatic lobe lesion merits surveillance, it had nonspecific enhancement characteristics on prior cross-sectional imaging and only a thin enhancing wall which could conceivably predispose to false negative due to central necrosis. 6. Several tiny chronic pulmonary nodules are likely benign. 7. Other imaging findings of potential clinical significance: Aortic Atherosclerosis (ICD10-I70.0). Coronary atherosclerosis. Bilateral nonobstructive nephrolithiasis.      11/10/2016 Surgery    LAPAROSCOPIC EXTENDED RIGHT COLECTOMY by [name]       11/10/2016 Pathology Results    Diagnosis 11/10/16 Colon, segmental resection for tumor, Ascending and Transverse ADENOCARCINOMA OF THE ASCENDING COLON WITH EXTRA CELLULAR MUCIN, GRADE 3, SIZE 3.4 CM THE TUMOR INVADES MUSCULARIS PROPRIA POORLY DIFFERENTIATED ADENOCARCINOMA OF THE TRANSVERSE COLON, GRADE 4, SIZE 6.1 CM THE TUMOR INVADES THROUGH MUSCULARIS PROPRIA INTO PERICOLONIC SOFT TISSUE EXTRAMURAL SATELLITE TUMOR NODULES IS IDENTIFIED (X1) ALL MARGINS OF  RESECTION ARE NEGATIVE FOR CARCINOMA METASTATIC CARCINOMA IN ONE OF SIXTY LYMPH NODES  (1/60) TABULAR ADENOMA X1      12/28/2016 Imaging    MRI abdomen w/wo contrast IMPRESSION: Motion degraded images.  Two indeterminate right liver lesions measuring up to 1.6 cm, as described above. Continued attention on follow-up is suggested.  Two periportal lymph nodes measuring up to 1.9 cm, suspicious for nodal metastases.  Postsurgical changes related to right colon resection.       12/29/2016 -  Chemotherapy    FOLFOX every 2 weeks starting 12/29/16       02/03/2017 Procedure    EUS Per [name] Endoscopic Finding Findings: The examined esophagus was endoscopically normal. The entire examined stomach was endoscopically normal. The examined duodenum was endoscopically normal. Endosonographic Finding (limited evaluation for tissue acquisition): 1. One enlarged lymph node (vs soft tissue mass) was visualized in the porta hepatis region. It measured 16 mm in maximal cross-sectional diameter. The node (vs soft tissue) was oval, isoechoic and had poorly defined margins. Fine needle aspiration for cytology was performed. Color Doppler imaging was utilized prior to needle puncture to confirm a lack of significant vascular structures within the needle path. Threepasses were made with the 25 gauge needle using a transgastric approach. 2. CBD was normal, non-dilated 3. Limited views of the liver, spleen, pancreas were all normal.  Impression: One periportal soft tissue mass (lymphnode?) measuring 41mm was noted and sampled with FNA.      02/03/2017 Pathology Results    Diagnosis FINE NEEDLE ASPIRATION, ENDOSCOPIC, PERI PORTAL NODE (SPECIMEN 1 OF 1 COLLECTED 02/03/17): NO MALIGNANT CELLS IDENTIFIED. SCANT LYMPHOID AND BENIGN GLANDULAR ELEMENTS.       CURRENT THERAPY: FOLFOX  INTERVAL HISTORY: [place] 76 y.o. female returns for evaluation of FOLFOX.  She says she has always has difficulty with the 5FU pump. Her difficulty has been with nausea/vomiting.   However this past time she describes her lips and jaw were "drawing down" in jerking motions.  She also says her hips and legs were slow and heavy and not working.  These issues started after she got home two weeks ago on 12/17.  She says it took about 3 days to improve.  She then had difficulty with her neck and chest.  She says her neck felt pain in her chest from midline up.  She took Ibuprofen PM.  She describes this pain as a pressure, and didn't feel right.  It worsened with bending over.  This pain lasted for about three days.  She says the pain was there with movement, but was not there with sitting.  She didn't seek any medical care, or call our office.  She continues to work with physical therapy.  She hasn't been taking the eliquis lately due to recent biopsy a few weeks ago.  She hasn't restarted it yet.     Patient Active Problem List   Diagnosis Date Noted  . Adenopathy   . UTI (urinary tract infection) 12/15/2016  . Acute deep vein thrombosis (DVT) of proximal vein of left lower extremity ([place]) 11/24/2016  . Cancer of overlapping sites of colon ([place]) 09/23/2016  . Iron deficiency anemia due to chronic blood loss 09/23/2016  . Estrogen deficiency 07/30/2016  . Screening mammogram, encounter for 07/30/2016  . Routine general medical examination at a health care facility 07/27/2016  . Right buttock pain 06/27/2015  . Accidental fall 06/27/2015  . Left hip pain 06/27/2015  . Stroke (cerebrum) ([place]) 05/06/2015  .  HTN (hypertension) 05/06/2015  . HLD (hyperlipidemia) 05/06/2015  . GERD (gastroesophageal reflux disease) 05/06/2015  . CAD (coronary artery disease) 05/06/2015  . Insect bites 09/17/2014  . Joint pain 05/01/2013  . Pain in joint, ankle and foot 03/02/2013  . Seborrheic keratosis 07/05/2012  . Ankle joint pain 07/16/2011  . 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 Callus of foot 10/16/2010  . Coronary artery disease, non-occlusive 10/16/2010  . INSOMNIA 02/02/2010  . HAND PAIN,  BILATERAL 01/09/2010  . CRAMP OF LIMB 01/09/2010  . HYPERGLYCEMIA 07/29/2009  . ALLERGIC RHINITIS 12/04/2008  . Vitamin D deficiency 04/30/2008  . COLONIC POLYPS 06/28/2007  . IBS 03/17/2007  . INTERSTITIAL CYSTITIS 03/17/2007  . Osteoporosis 03/17/2007    is allergic to salmon [fish allergy]; ampicillin; codeine; flagyl [metronidazole]; meperidine hcl; neomycin; penicillins; propoxyphene hcl; sulfa antibiotics; and tramadol hcl.  MEDICAL HISTORY: Past Medical History:  Diagnosis Date  . Anemia   . Arthritis   . Astigmatism    both eyes  . Cancer [facility])    colon surgery done last chemo tx 01-17-17  . Coronary artery disease, non-occlusive   . Dyspnea    more so exertion  . Dysrhythmia    skipped beats every now and then  . GERD (gastroesophageal reflux disease)   . Glaucoma    both eyes right eye worse than left  . Hammer toes of both feet   . Headache    migraines with aura  . Heel spur    both heels and front  . HSV (herpes simplex virus) infection   . Hyperglycemia   . Hyperlipidemia   . IBS (irritable bowel syndrome)   . IC (interstitial cystitis)   . Insomnia disorder related to known organic factor   . Osteoporosis   . PONV (postoperative nausea and vomiting)    has had more surgeries, and had no problems  . Stroke ([place]) 2016  . Vitamin D deficiency     SURGICAL HISTORY: Past Surgical History:  Procedure Laterality Date  . ABDOMINAL HYSTERECTOMY     1 ovary and womb removed  . CARDIAC CATHETERIZATION  7/12   non obst dz -- not sure what yr  . CHOLECYSTECTOMY    . EUS N/A 02/03/2017   Procedure: UPPER ENDOSCOPIC ULTRASOUND (EUS) LINEAR;  Surgeon: [name], MD;  Location: [facility] END[facility];  Service: Endoscopy;  Laterality: N/A;  . EYE SURGERY Bilateral    ioc for cataracts   . LAPAROSCOPIC RIGHT COLECTOMY Right 11/10/2016   Procedure: LAPAROSCOPIC EXTENDED RIGHT COLECTOMY;  Surgeon: [name], MD;  Location: [facility];  Service: General;  Laterality:  Right;  . PORTACATH PLACEMENT Left 12/22/2016   Procedure: INSERTION PORT-A-CATH;  Surgeon: [name], MD;  Location: [facility];  Service: General;  Laterality: Left;  . ROTATOR CUFF REPAIR Bilateral     SOCIAL HISTORY: Social History   Socioeconomic History  . Marital status: Married    Spouse name: Not on file  . Number of children: 4  . Years of education: Not on file  . Highest education level: Not on file  Social Needs  . Financial resource strain: Not on file  . Food insecurity - worry: Not on file  . Food insecurity - inability: Not on file  . Transportation needs - medical: Not on file  . Transportation needs - non-medical: Not on file  Occupational History  . Occupation: reitred    Employer: RETIRED  Tobacco Use  . Smoking status: Never Smoker  . Smokeless  tobacco: Never Used  Substance and Sexual Activity  . Alcohol use: No    Alcohol/week: 0.0 oz  . Drug use: No  . Sexual activity: Not on file  Other Topics Concern  . Not on file  Social History Narrative   Rides horses         Specialty providers    [facility]   Cardio- [facility]   Ortho--[place]   Rheum-[facility]   urol-[facility]   ENT-- [facility]    FAMILY HISTORY: Family History  Problem Relation Age of Onset  . Alcohol abuse Mother   . Heart attack Mother   . GER disease Mother   . GER disease Father   . Alcohol abuse Father   . Stroke Father   . Tuberculosis Father   . Heart attack Father   . Aneurysm Father   . Mental illness Sister   . Depression Sister   . Breast cancer Neg Hx     Review of Systems  Constitutional: Negative for appetite change, chills, fatigue, fever and unexpected weight change.  HENT:   Negative for hearing loss and lump/mass.   Eyes: Negative for eye problems and icterus.  Respiratory: Negative for chest tightness, cough and shortness of breath.   Cardiovascular: Positive for chest pain. Negative for leg swelling and palpitations.   Gastrointestinal: Negative for abdominal distention, abdominal pain, blood in stool, constipation, diarrhea, nausea and vomiting.  Endocrine: Negative for hot flashes.  Genitourinary: Negative for difficulty urinating.   Musculoskeletal: Positive for gait problem. Negative for arthralgias.  Skin: Negative for itching and rash.  Neurological: Positive for extremity weakness and gait problem. Negative for dizziness, headaches, light-headedness and numbness.  Hematological: Negative for adenopathy. Does not bruise/bleed easily.  Psychiatric/Behavioral: Negative for depression. The patient is not nervous/anxious.       PHYSICAL EXAMINATION  ECOG PERFORMANCE STATUS: 2 - Symptomatic, <50% confined to bed  Vitals:   02/21/17 1047  BP: (!) 157/64  Pulse: 79  Resp: 20  Temp: 98.5 F (36.9 C)  SpO2: 100%    Physical Exam  Constitutional: She is oriented to person, place, and time and well-developed, well-nourished, and in no distress.  HENT:  Head: Normocephalic and atraumatic.  Eyes: Pupils are equal, round, and reactive to light. No scleral icterus.  Neck: Neck supple.  Cardiovascular: Normal rate, regular rhythm and normal heart sounds.  Pulmonary/Chest: Effort normal and breath sounds normal.  Abdominal: Soft. Bowel sounds are normal.  Musculoskeletal: She exhibits no edema.  Lymphadenopathy:    She has no cervical adenopathy.  Neurological: She is alert and oriented to person, place, and time.  Skin: Skin is warm and dry. No rash noted.  Psychiatric: Mood and affect normal.    LABORATORY DATA:  CBC    Component Value Date/Time   WBC 4.4 02/21/2017 1011   WBC 6.6 12/15/2016 1439   RBC 3.99 02/21/2017 1011   RBC 3.79 (L) 12/15/2016 1439   HGB 11.4 (L) 02/21/2017 1011   HCT 35.5 02/21/2017 1011   PLT 136 (L) 02/21/2017 1011   MCV 89.0 02/21/2017 1011   MCH 28.6 02/21/2017 1011   MCH 26.4 11/30/2016 0211   MCHC 32.1 02/21/2017 1011   MCHC 32.1 12/15/2016 1439   RDW  18.4 (H) 02/21/2017 1011   LYMPHSABS 1.2 02/21/2017 1011   MONOABS 0.9 02/21/2017 1011   EOSABS 0.2 02/21/2017 1011   BASOSABS 0.0 02/21/2017 1011    CMP     Component Value Date/Time   NA 141 02/21/2017  1011   K 3.5 02/21/2017 1011   CL 103 12/15/2016 1439   CO2 24 02/21/2017 1011   GLUCOSE 134 02/21/2017 1011   BUN 9.7 02/21/2017 1011   CREATININE 0.9 02/21/2017 1011   CALCIUM 9.4 02/21/2017 1011   PROT 6.5 02/21/2017 1011   ALBUMIN 3.4 (L) 02/21/2017 1011   AST 29 02/21/2017 1011   ALT 32 02/21/2017 1011   ALKPHOS 86 02/21/2017 1011   BILITOT 0.35 02/21/2017 1011   GFRNONAA 55 (L) 11/28/2016 0314   GFRAA >60 11/28/2016 0314       ASSESSMENT and PLAN:   Cancer of overlapping sites of colon ([place]) Yvonne [name] is a 76 year old woman with colon cancer metastatic to the liver s/p surgery, currently undergoing adjuvant chemotherapy with FOLFOX.    1. Colon Cancer: Due for FOLFOX today.  I am concerned about what sounds like dystonic reactions following her treatment.  The patient will also see Dr. Burr Medico to review this with her in detail.  2. Chest pain: EKG normal, worse with activity, has h/o CAD.  I also reviewed with Dr. Burr Medico and she will eval when she sees patient in infusion.     All questions were answered. The patient knows to call the clinic with any problems, questions or concerns. We can certainly see the patient much sooner if necessary. This note was electronically signed. Scot Dock, NP 02/21/2017   Addendum  I have seen the patient, examined her. I agree with the assessment and and plan and have edited the notes.   Pt developed muscular stiffness and tightness in her jaw, and legs after last chemo infusion, probably related to Oxaliplatin, but I don't think it's true allergy reaction. She had significant nausea and vomiting after chemo, will add pre-medication iv emend, along with slightly increased dexa at 12mg . Her atypical chest pain happened 3 days  after chemo, probably not related to 5-fu, EKG was normal today. Will continue f/u closely. Lab reviewed, adequate for treatment, will proceed chemo today.   Truitt [name]  02/21/2017

## 2017-02-21 NOTE — Patient Instructions (Signed)
[facility] Discharge Instructions for Patients Receiving Chemotherapy  Today you received the following chemotherapy agents Oxaliplatin, Leucovorin, Adrucil  To help prevent nausea and vomiting after your treatment, we encourage you to take your nausea medication as directed   If you develop nausea and vomiting that is not controlled by your nausea medication, call the clinic.   BELOW ARE SYMPTOMS THAT SHOULD BE REPORTED IMMEDIATELY:  *FEVER GREATER THAN 100.5 F  *CHILLS WITH OR WITHOUT FEVER  NAUSEA AND VOMITING THAT IS NOT CONTROLLED WITH YOUR NAUSEA MEDICATION  *UNUSUAL SHORTNESS OF BREATH  *UNUSUAL BRUISING OR BLEEDING  TENDERNESS IN MOUTH AND THROAT WITH OR WITHOUT PRESENCE OF ULCERS  *URINARY PROBLEMS  *BOWEL PROBLEMS  UNUSUAL RASH Items with * indicate a potential emergency and should be followed up as soon as possible.  Feel free to call the clinic should you have any questions or concerns. The clinic phone number is [phone].  Please show the CHEMO ALERT CARD at check-in to the Emergency Department and triage nurse.   

## 2017-02-21 NOTE — Assessment & Plan Note (Signed)
[name] is a 76 year old woman with colon cancer metastatic to the liver s/p surgery, currently undergoing adjuvant chemotherapy with FOLFOX.    1. Colon Cancer: Due for FOLFOX today.  I am concerned about what sounds like dystonic reactions following her treatment.  The patient will also see Dr. Burr Medico to review this with her in detail.  2. Chest pain: EKG normal, worse with activity, has h/o CAD.  I also reviewed with Dr. Burr Medico and she will eval when she sees patient in infusion.

## 2017-02-23 ENCOUNTER — Ambulatory Visit (HOSPITAL_BASED_OUTPATIENT_CLINIC_OR_DEPARTMENT_OTHER): Payer: Medicare HMO | Admitting: Genetics

## 2017-02-23 ENCOUNTER — Ambulatory Visit (HOSPITAL_BASED_OUTPATIENT_CLINIC_OR_DEPARTMENT_OTHER): Payer: Medicare HMO

## 2017-02-23 ENCOUNTER — Encounter: Payer: Self-pay | Admitting: Genetics

## 2017-02-23 ENCOUNTER — Other Ambulatory Visit: Payer: Medicare HMO

## 2017-02-23 DIAGNOSIS — C787 Secondary malignant neoplasm of liver and intrahepatic bile duct: Secondary | ICD-10-CM

## 2017-02-23 DIAGNOSIS — C188 Malignant neoplasm of overlapping sites of colon: Secondary | ICD-10-CM

## 2017-02-23 MED ORDER — SODIUM CHLORIDE 0.9% FLUSH
10.0000 mL | INTRAVENOUS | Status: DC | PRN
  Administered 2017-02-23: 10 mL
  Filled 2017-02-23: qty 10

## 2017-02-23 MED ORDER — HEPARIN SOD (PORK) LOCK FLUSH 100 UNIT/ML IV SOLN
500.0000 [IU] | Freq: Once | INTRAVENOUS | Status: AC | PRN
  Administered 2017-02-23: 500 [IU]
  Filled 2017-02-23: qty 5

## 2017-02-23 NOTE — Progress Notes (Signed)
REFERRING PROVIDER: [name], MD [place], [place] [address]  PRIMARY PROVIDER:  Abner Greenspan, MD  PRIMARY REASON FOR VISIT:  1. Cancer of overlapping sites of colon [facility])      HISTORY OF PRESENT ILLNESS:   [name], a 77 y.o. female, was seen for a [place] cancer genetics consultation at the request of Dr. Burr Medico due to a personal history of cancer.  [name] presents to clinic today to discuss the possibility of a hereditary predisposition to cancer, genetic testing, and to further clarify her future cancer risks, as well as potential cancer risks for family members.   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  She had a right colectomy followed by chemotherapy.   Pathology results suspicious for Lynch Syndrome.   CANCER HISTORY:    Cancer of overlapping sites of colon ([place])   08/31/2016 Tumor Marker    Patient's tumor was tested for the following markers: CEA. Results of the tumor marker test revealed 2.0.      08/31/2016 Pathology Results    Colon biopsy Diagnosis: 1. Surgical [P], hepatic flexure. ADENOCARCINOMA. Moderately differentiated. 2. Surgical [P], transverse. ADENOCARCINOMA, Moderately differentiated.      08/31/2016 Initial Diagnosis    Colon cancer metastasized to liver ([place])      08/31/2016 Procedure    - 1) Polypoid mass at the hepatic flexure, 2cm, soft but clearly neoplastic.  - 2) Clearly malignant mass in the distal transverse colon; 4cm, firm, 1/2 cirumference, ulcerated. ' - 3) Four small (3-86m) scattered typical appearing adenomas throughout the colon, notremoved since she mistakenly stayed on her plavix for this procedure.      09/01/2016 Imaging    CT Abdomen Pelvis W Contrast IMPRESSION: There are 2 colonic lesions involving the proximal ascending colon and proximal transverse colon, as detailed above, highly concerning  for colonic neoplasm. In addition, there are at least 2 indeterminate liver lesions which are suspicious for potential metastatic disease.       09/16/2016 Imaging    Mr Liver W Wo Contrast IMPRESSION: Lesion within central right lobe of liver exhibits of peripheral enhancement and is suspicious for liver metastasis. Lesion within caudate lobe of liver identified on recent CT does not have a corresponding signal or enhancement abnormality on today's study. Within the inferior right lobe of liver there is an enhancing structure which is favored to represent an atypical benign hemangioma with liver metastasis felt less likely. The transverse colon lesion is again identified compatible with colonic adenocarcinoma.      10/04/2016 PET scan    PET 10/04/16 IMPRESSION: 1. Highly hypermetabolic proximal transverse colon mass, maximum SUV 30.8. Small focus of hypermetabolic activity in the mesentery just above this mass probably represents a local involved lymph node. 2. Moderately hypermetabolic ascending colon mass, maximum SUV 8.7. 3. Highly hypermetabolic enlarged portacaval lymph node, maximum SUV 16.7. 4. Focus of hypermetabolic activity near the duodenum bulb could be physiologic or due to an adjacent lymph node. 5. None of the liver lesions are discernibly hypermetabolic. Particularly the more cephalad right hepatic lobe lesion merits surveillance, it had nonspecific enhancement characteristics on prior cross-sectional imaging and only a thin enhancing wall which could conceivably predispose to false negative due to central necrosis. 6. Several tiny chronic pulmonary nodules are likely benign. 7. Other imaging findings of potential clinical significance: Aortic Atherosclerosis (ICD10-I70.0). Coronary atherosclerosis. Bilateral nonobstructive  nephrolithiasis.      11/10/2016 Surgery    LAPAROSCOPIC EXTENDED RIGHT COLECTOMY by [name]       11/10/2016 Pathology Results    Diagnosis  11/10/16 Colon, segmental resection for tumor, Ascending and Transverse ADENOCARCINOMA OF THE ASCENDING COLON WITH EXTRA CELLULAR MUCIN, GRADE 3, SIZE 3.4 CM THE TUMOR INVADES MUSCULARIS PROPRIA POORLY DIFFERENTIATED ADENOCARCINOMA OF THE TRANSVERSE COLON, GRADE 4, SIZE 6.1 CM THE TUMOR INVADES THROUGH MUSCULARIS PROPRIA INTO PERICOLONIC SOFT TISSUE EXTRAMURAL SATELLITE TUMOR NODULES IS IDENTIFIED (X1) ALL MARGINS OF RESECTION ARE NEGATIVE FOR CARCINOMA METASTATIC CARCINOMA IN ONE OF SIXTY LYMPH NODES (1/60) TABULAR ADENOMA X1      12/28/2016 Imaging    MRI abdomen w/wo contrast IMPRESSION: Motion degraded images.  Two indeterminate right liver lesions measuring up to 1.6 cm, as described above. Continued attention on follow-up is suggested.  Two periportal lymph nodes measuring up to 1.9 cm, suspicious for nodal metastases.  Postsurgical changes related to right colon resection.       12/29/2016 -  Chemotherapy    FOLFOX every 2 weeks starting 12/29/16       02/03/2017 Procedure    EUS Per [name] Endoscopic Finding Findings: The examined esophagus was endoscopically normal. The entire examined stomach was endoscopically normal. The examined duodenum was endoscopically normal. Endosonographic Finding (limited evaluation for tissue acquisition): 1. One enlarged lymph node (vs soft tissue mass) was visualized in the porta hepatis region. It measured 16 mm in maximal cross-sectional diameter. The node (vs soft tissue) was oval, isoechoic and had poorly defined margins. Fine needle aspiration for cytology was performed. Color Doppler imaging was utilized prior to needle puncture to confirm a lack of significant vascular structures within the needle path. Threepasses were made with the 25 gauge needle using a transgastric approach. 2. CBD was normal, non-dilated 3. Limited views of the liver, spleen, pancreas were all normal.  Impression: One periportal soft tissue mass  (lymphnode?) measuring 45m was noted and sampled with FNA.      02/03/2017 Pathology Results    Diagnosis FINE NEEDLE ASPIRATION, ENDOSCOPIC, PERI PORTAL NODE (SPECIMEN 1 OF 1 COLLECTED 02/03/17): NO MALIGNANT CELLS IDENTIFIED. SCANT LYMPHOID AND BENIGN GLANDULAR ELEMENTS.        HORMONAL RISK FACTORS:  Ovaries intact: yes.1 ovary intact, 1 removed  Hysterectomy: yes.  Menopausal status: postmenopausal.    Past Medical History:  Diagnosis Date  . Anemia   . Arthritis   . Astigmatism    both eyes  . Cancer ([facility]    colon surgery done last chemo tx 01-17-17  . Coronary artery disease, non-occlusive   . Dyspnea    more so exertion  . Dysrhythmia    skipped beats every now and then  . GERD (gastroesophageal reflux disease)   . Glaucoma    both eyes right eye worse than left  . Hammer toes of both feet   . Headache    migraines with aura  . Heel spur    both heels and front  . HSV (herpes simplex virus) infection   . Hyperglycemia   . Hyperlipidemia   . IBS (irritable bowel syndrome)   . IC (interstitial cystitis)   . Insomnia disorder related to known organic factor   . Osteoporosis   . PONV (postoperative nausea and vomiting)    has had more surgeries, and had no problems  . Stroke ([facility] 2016  . Vitamin D deficiency     Past Surgical History:  Procedure Laterality Date  .  ABDOMINAL HYSTERECTOMY     1 ovary and womb removed  . CARDIAC CATHETERIZATION  7/12   non obst dz -- not sure what yr  . CHOLECYSTECTOMY    . EUS N/A 02/03/2017   Procedure: UPPER ENDOSCOPIC ULTRASOUND (EUS) LINEAR;  Surgeon: [name], MD;  Location: [facility];  Service: Endoscopy;  Laterality: N/A;  . EYE SURGERY Bilateral    ioc for cataracts   . LAPAROSCOPIC RIGHT COLECTOMY Right 11/10/2016   Procedure: LAPAROSCOPIC EXTENDED RIGHT COLECTOMY;  Surgeon: [name], MD;  Location: [facility];  Service: General;  Laterality: Right;  . PORTACATH PLACEMENT Left 12/22/2016    Procedure: INSERTION PORT-A-CATH;  Surgeon: [name], MD;  Location: [facility];  Service: General;  Laterality: Left;  . ROTATOR CUFF REPAIR Bilateral     Social History   Socioeconomic History  . Marital status: Married    Spouse name: Not on file  . Number of children: 4  . Years of education: Not on file  . Highest education level: Not on file  Social Needs  . Financial resource strain: Not on file  . Food insecurity - worry: Not on file  . Food insecurity - inability: Not on file  . Transportation needs - medical: Not on file  . Transportation needs - non-medical: Not on file  Occupational History  . Occupation: reitred    Employer: RETIRED  Tobacco Use  . Smoking status: Never Smoker  . Smokeless tobacco: Never Used  Substance and Sexual Activity  . Alcohol use: No    Alcohol/week: 0.0 oz  . Drug use: No  . Sexual activity: Not on file  Other Topics Concern  . Not on file  Social History Narrative   Rides horses         Specialty providers    [facility]   Cardio- [facility]   Ortho--[place]   Rheum-[facility]   urol-[facility]   ENT-- [facility]     FAMILY HISTORY:  We obtained a detailed, 4-generation family history.  Significant diagnoses are listed below: Family History  Problem Relation Age of Onset  . Alcohol abuse Mother   . Heart attack Mother   . GER disease Mother   . GER disease Father   . Alcohol abuse Father   . Stroke Father   . Tuberculosis Father   . Heart attack Father   . Aneurysm Father   . Mental illness Sister   . Depression Sister   . Breast cancer Neg Hx    [name] has 2 daughters and 2 sons in their 29's/60's with no known history of cancer (1 son she gave up for adoption and does not know his history).  [name] has a sister who she is no longer in contact with and does not know her health history.  He has a brother who died at 46 due to arterial disease.  [name] has a maternal half-sister (this  sister's father is her paternal uncle) who died at 46 due to trauma.   [name] father died at 53 with no history of cancer.  [name] has 1 paternal uncle who died at 53 due to unknown causes - possibly cancer.  This uncle is the father of her maternal half-sister.   [name] paternal grandfather died in his older age- unk cause.  [name] does not know the health information about her paternal grandmother.    [name] mother died at 66 due to strokes and a heart  attack.  [name] has 2 maternal uncles with no history of cancer and no children.  They lived to old age.  [name] maternal grandparents lived to old age with no known history of cancer.   [name] is unaware of previous family history of genetic testing for hereditary cancer risks. Patient's maternal ancestors are of Bohemian/Caucaisan descent, and paternal ancestors are of English descent. There is no reported Ashkenazi Jewish ancestry. There is no known consanguinity.  GENETIC COUNSELING ASSESSMENT: [name] is a 77 y.o. female with a personal history which is somewhat suggestive of a Hereditary Cancer Predisposition Syndrome. We, therefore, discussed and recommended the following at today's visit.   DISCUSSION: We reviewed the characteristics, features and inheritance patterns of hereditary cancer syndromes. We also discussed genetic testing, including the appropriate family members to test, the process of testing, insurance coverage and turn-around-time for results. We discussed the implications of a negative, positive and/or variant of uncertain significant result. We recommended [name] pursue genetic testing for the TumorNext Lynch + CancerNext gene panel. The CancerNext gene panel offered by [employer] includes sequencing and rearrangement analysis for the following 32 genes:   APC, ATM, BARD1, BMPR1A, BRCA1, BRCA2, BRIP1, CDH1, CDK4, CDKN2A, CHEK2, EPCAM, GREM1, MLH1,  MRE11A, MSH2, MSH6, MUTYH, NBN, NF1, PALB2, PMS2, POLD1, POLE, PTEN, RAD50, RAD51D, SMAD4, SMARCA4, STK11, and TP53.   TumorNext Lynch analysis on her tumor sample can test the often help determine if there is a Lynch syndrome mutation that could be undetected, or more clearly determine that her cancer is sporadic. TumorNext Lynch testing is performed in tandem with genetic testing, and typically at no additional cost.   We discussed that only 5-10% of cancers are associated with a Hereditary Cancer Predisposition Syndrome.  The most common hereditary cancer syndrome associated with colon cancer is Lynch Syndrome.  Lynch Syndrome is caused by mutations in the genes: MLH1, MSH2, MSH6, PMS2 and EPCAM.  This syndrome increases the risk for colon, uterine, ovarian and stomach cancers, as well as others.  Families with Lynch Syndrome tend to have multiple family members with these cancers, typically diagnosed under age 81, and diagnoses in multiple generations.    We discussed that there are several other genes that are associated with an increased risk for colon cancer and increased polyp burden (MUTYH, APC, POLE, CHEK2, etc.) We also dicussed that there are many genes that cause many different types of cancer risks.    [name] had intact Immuno-histo chemistry (IHC) performed on her tumor on 11/10/2016.  IHC testing was abnormal and indicated absent MLH1 and PMS2. Microsatellite Instability (MSI) was High.  These pathology tumor testing results are suspicious for a Lynch Syndrome-related cacner.   We discussed that if she is found to have a mutation in one of these genes, it may impact future medical management recommendations such as increased cancer screenings and consideration of risk reducing surgeries.  A positive result could also have implications for the patient's family members.  A Negative result would mean we were unable to identify a hereditary component to her cancer, but does not rule  out the possibility of a hereditary basis for her cancer.  There could be mutations that are undetectable by current technology, or in genes not yet tested or identified to increase cancer risk.    We discussed the potential to find a Variant of Uncertain Significance or VUS.  These are variants that have not yet been identified as pathogenic or benign, and  it is unknown if this variant is associated with increased cancer risk or if this is a normal finding.  Most VUS's are reclassified to benign or likely benign.   It should not be used to make medical management decisions. With time, we suspect the lab will determine the significance of any VUS's identified if any.   Based on [name]'s personal history of cancer, she meets medical criteria for genetic testing. Despite that she meets criteria, she may still have an out of pocket cost. We discussed that if her out of pocket cost for testing is over $100, the laboratory will call and confirm whether she wants to proceed with testing.  If the out of pocket cost of testing is less than $100 she will be billed by the genetic testing laboratory.   PLAN: After considering the risks, benefits, and limitations, Ms. Sienkiewicz  provided informed consent to pursue genetic testing and the blood sample was sent to [facility] for analysis of the TumorNext Lynch+ [place]. Results should be available within approximately 1 month, at which point they will be disclosed by telephone to Ms. Cena, as will any additional recommendations warranted by these results. Ms. Zoeller will receive a summary of her genetic counseling visit and a copy of her results once available. This information will also be available in Epic. We encouraged Ms. Consoli to remain in contact with cancer genetics annually so that we can continuously update the family history and inform her of any changes in cancer genetics and testing that may be of benefit for her family. Ms. Vernet  questions were answered to her satisfaction today. Our contact information was provided should additional questions or concerns arise.  Lastly, we encouraged Ms. Screws to remain in contact with cancer genetics annually so that we can continuously update the family history and inform her of any changes in cancer genetics and testing that may be of benefit for this family.   Ms.  Koren questions were answered to her satisfaction today. Our contact information was provided should additional questions or concerns arise. Thank you for the referral and allowing [place] to share in the care of your patient.   Tana Felts, MS Genetic Counselor [email or web address] phone: [phone]  The patient was seen for a total of 60 minutes in face-to-face genetic counseling.

## 2017-02-24 ENCOUNTER — Other Ambulatory Visit: Payer: Medicare HMO

## 2017-02-24 DIAGNOSIS — C188 Malignant neoplasm of overlapping sites of colon: Secondary | ICD-10-CM | POA: Diagnosis not present

## 2017-02-24 DIAGNOSIS — Z483 Aftercare following surgery for neoplasm: Secondary | ICD-10-CM | POA: Diagnosis not present

## 2017-02-24 DIAGNOSIS — K589 Irritable bowel syndrome without diarrhea: Secondary | ICD-10-CM | POA: Diagnosis not present

## 2017-02-24 DIAGNOSIS — D509 Iron deficiency anemia, unspecified: Secondary | ICD-10-CM | POA: Diagnosis not present

## 2017-02-24 DIAGNOSIS — I82422 Acute embolism and thrombosis of left iliac vein: Secondary | ICD-10-CM | POA: Diagnosis not present

## 2017-02-24 DIAGNOSIS — M810 Age-related osteoporosis without current pathological fracture: Secondary | ICD-10-CM | POA: Diagnosis not present

## 2017-02-24 DIAGNOSIS — M6281 Muscle weakness (generalized): Secondary | ICD-10-CM | POA: Diagnosis not present

## 2017-02-24 DIAGNOSIS — E559 Vitamin D deficiency, unspecified: Secondary | ICD-10-CM | POA: Diagnosis not present

## 2017-02-24 DIAGNOSIS — I10 Essential (primary) hypertension: Secondary | ICD-10-CM | POA: Diagnosis not present

## 2017-02-24 DIAGNOSIS — I2510 Atherosclerotic heart disease of native coronary artery without angina pectoris: Secondary | ICD-10-CM | POA: Diagnosis not present

## 2017-02-28 DIAGNOSIS — C188 Malignant neoplasm of overlapping sites of colon: Secondary | ICD-10-CM | POA: Diagnosis not present

## 2017-03-03 ENCOUNTER — Encounter: Payer: Self-pay | Admitting: Internal Medicine

## 2017-03-03 ENCOUNTER — Ambulatory Visit (INDEPENDENT_AMBULATORY_CARE_PROVIDER_SITE_OTHER): Payer: Medicare HMO | Admitting: Internal Medicine

## 2017-03-03 VITALS — BP 136/78 | HR 85 | Temp 97.7°F | Wt 149.0 lb

## 2017-03-03 DIAGNOSIS — R300 Dysuria: Secondary | ICD-10-CM | POA: Diagnosis not present

## 2017-03-03 DIAGNOSIS — R350 Frequency of micturition: Secondary | ICD-10-CM

## 2017-03-03 DIAGNOSIS — R3915 Urgency of urination: Secondary | ICD-10-CM | POA: Diagnosis not present

## 2017-03-03 DIAGNOSIS — N3000 Acute cystitis without hematuria: Secondary | ICD-10-CM | POA: Diagnosis not present

## 2017-03-03 LAB — POC URINALSYSI DIPSTICK (AUTOMATED)
Bilirubin, UA: NEGATIVE
Glucose, UA: NEGATIVE
Ketones, UA: NEGATIVE
Nitrite, UA: NEGATIVE
Spec Grav, UA: 1.025 (ref 1.010–1.025)
Urobilinogen, UA: 0.2 E.U./dL
pH, UA: 6 (ref 5.0–8.0)

## 2017-03-03 MED ORDER — CIPROFLOXACIN HCL 250 MG PO TABS: 250.0000 mg | ORAL_TABLET | Freq: Two times a day (BID) | ORAL | 0 refills | Status: DC

## 2017-03-03 NOTE — Addendum Note (Signed)
Addended by: Lurlean Nanny on: 03/03/2017 01:16 PM   Modules accepted: Orders

## 2017-03-03 NOTE — Progress Notes (Signed)
HPI  Pt presents to the clinic today with c/o urinary urgency, frequency and dysuria. This started 2-3 days ago. She denies fever, chills, nausea or low back pain. She denies vaginal complaints. She has tried drinking Cranberry with minimal relief. Her last UTI was 11/2016, treated with Keflex.   Review of Systems  Past Medical History:  Diagnosis Date  . Anemia   . Arthritis   . Astigmatism    both eyes  . Cancer [facility])    colon surgery done last chemo tx 01-17-17  . Colon cancer ([facility])   . Coronary artery disease, non-occlusive   . Dyspnea    more so exertion  . Dysrhythmia    skipped beats every now and then  . GERD (gastroesophageal reflux disease)   . Glaucoma    both eyes right eye worse than left  . Hammer toes of both feet   . Headache    migraines with aura  . Heel spur    both heels and front  . HSV (herpes simplex virus) infection   . Hyperglycemia   . Hyperlipidemia   . IBS (irritable bowel syndrome)   . IC (interstitial cystitis)   . Insomnia disorder related to known organic factor   . Osteoporosis   . PONV (postoperative nausea and vomiting)    has had more surgeries, and had no problems  . Stroke ([place]) 2016  . Vitamin D deficiency     Family History  Problem Relation Age of Onset  . Alcohol abuse Mother   . Heart attack Mother   . GER disease Mother   . GER disease Father   . Alcohol abuse Father   . Stroke Father   . Tuberculosis Father   . Heart attack Father   . Aneurysm Father   . Mental illness Sister   . Depression Sister   . Breast cancer Neg Hx     Social History   Socioeconomic History  . Marital status: Married    Spouse name: Not on file  . Number of children: 4  . Years of education: Not on file  . Highest education level: Not on file  Social Needs  . Financial resource strain: Not on file  . Food insecurity - worry: Not on file  . Food insecurity - inability: Not on file  . Transportation needs - medical: Not on file  .  Transportation needs - non-medical: Not on file  Occupational History  . Occupation: reitred    Employer: RETIRED  Tobacco Use  . Smoking status: Never Smoker  . Smokeless tobacco: Never Used  Substance and Sexual Activity  . Alcohol use: No    Alcohol/week: 0.0 oz  . Drug use: No  . Sexual activity: Not on file  Other Topics Concern  . Not on file  Social History Narrative   Rides horses         Specialty providers    [facility]   Cardio- [facility]   Ortho--[place]   Rheum-[facility]   urol-[facility]   ENT-- [facility]    Allergies  Allergen Reactions  . Salmon [Fish Allergy] Anaphylaxis    This happens with "CANNED SALMON" only, if eaten uncooked  . Ampicillin Nausea Only    Has patient had a PCN reaction causing immediate rash, facial/tongue/throat swelling, SOB or lightheadedness with hypotension: No Has patient had a PCN reaction causing severe rash involving mucus membranes or skin necrosis: No Has patient had a PCN reaction that required hospitalization: No Has patient had  a PCN reaction occurring within the last 10 years: No If all of the above answers are "NO", then may proceed with Cephalosporin use.   . Codeine Nausea And Vomiting  . Flagyl [Metronidazole] Nausea And Vomiting    Patient was taking this along with Neomycin and could not differentiate which med triggered the nausea & vomiting  . Meperidine Hcl Nausea And Vomiting  . Neomycin Nausea And Vomiting    Patient was taking this along with Flagyl and could not differentiate which med triggered the nausea & vomiting  . Penicillins Other (See Comments)    Has patient had a PCN reaction causing immediate rash, facial/tongue/throat swelling, SOB or lightheadedness with hypotension: Unknown Has patient had a PCN reaction causing severe rash involving mucus membranes or skin necrosis: No Has patient had a PCN reaction that required hospitalization: No Has patient had a PCN reaction occurring within the last 10  years: No If all of the above answers are "NO", then may proceed with Cephalosporin use.   Marland Kitchen Propoxyphene Hcl Nausea And Vomiting  . Sulfa Antibiotics Hives  . Tramadol Hcl Nausea And Vomiting     Constitutional: Denies fever, malaise, fatigue, headache or abrupt weight changes.   GU: Pt reports urgency, frequency and pain with urination. Denies burning sensation, blood in urine, odor or discharge.    No other specific complaints in a complete review of systems (except as listed in HPI above).    Objective:   Physical Exam  BP 136/78   Pulse 85   Temp 97.7 F (36.5 C) (Oral)   Wt 149 lb (67.6 kg)   SpO2 98%   BMI 25.58 kg/m   Wt Readings from Last 3 Encounters:  03/03/17 149 lb (67.6 kg)  02/21/17 150 lb 4.8 oz (68.2 kg)  02/07/17 150 lb (68 kg)    General: Appears her stated age, well developed, well nourished in NAD. Abdomen: Soft. Normal bowel sounds. No distention or masses noted.  Tender to palpation over the bladder area. No CVA tenderness.       Assessment & Plan:   Urgency, Frequency, Dysuria secondary to UTI:  Urinalysis: 3+ leuks, 2+ blood Will send urine culture eRx sent if for Cipro 250 mg BID x 5 days OK to take AZO OTC Drink plenty of fluids  RTC as needed or if symptoms persist. Webb Silversmith, NP

## 2017-03-03 NOTE — Patient Instructions (Signed)

## 2017-03-04 LAB — URINE CULTURE
MICRO NUMBER:: 90040742
SPECIMEN QUALITY:: ADEQUATE

## 2017-03-06 NOTE — Progress Notes (Signed)
[place]  [phone] Fax:(336) [phone]  Clinic Follow up Note   Patient Care Team: Tower, [name], MD as PCP - General [name], MD as Consulting Physician (Hematology) [name], MD as Attending Physician (Gastroenterology) [name], MD as Consulting Physician (General Surgery) 03/07/2017  CHIEF COMPLAINT: Follow up colorectal adenocarcinoma   SUMMARY OF ONCOLOGIC HISTORY:   Cancer of overlapping sites of colon ([place])   08/31/2016 Tumor Marker    Patient's tumor was tested for the following markers: CEA. Results of the tumor marker test revealed 2.0.      08/31/2016 Pathology Results    Colon biopsy Diagnosis: 1. Surgical [P], hepatic flexure. ADENOCARCINOMA. Moderately differentiated. 2. Surgical [P], transverse. ADENOCARCINOMA, Moderately differentiated.      08/31/2016 Initial Diagnosis    Colon cancer metastasized to liver ([place])      08/31/2016 Procedure    - 1) Polypoid mass at the hepatic flexure, 2cm, soft but clearly neoplastic.  - 2) Clearly malignant mass in the distal transverse colon; 4cm, firm, 1/2 cirumference, ulcerated. ' - 3) Four small (3-18m) scattered typical appearing adenomas throughout the colon, notremoved since she mistakenly stayed on her plavix for this procedure.      09/01/2016 Imaging    CT Abdomen Pelvis W Contrast IMPRESSION: There are 2 colonic lesions involving the proximal ascending colon and proximal transverse colon, as detailed above, highly concerning for colonic neoplasm. In addition, there are at least 2 indeterminate liver lesions which are suspicious for potential metastatic disease.       09/16/2016 Imaging    Mr Liver W Wo Contrast IMPRESSION: Lesion within central right lobe of liver exhibits of peripheral enhancement and is suspicious for liver metastasis. Lesion within caudate lobe of liver identified on recent CT does not have a corresponding signal or enhancement abnormality on today's  study. Within the inferior right lobe of liver there is an enhancing structure which is favored to represent an atypical benign hemangioma with liver metastasis felt less likely. The transverse colon lesion is again identified compatible with colonic adenocarcinoma.      10/04/2016 PET scan    PET 10/04/16 IMPRESSION: 1. Highly hypermetabolic proximal transverse colon mass, maximum SUV 30.8. Small focus of hypermetabolic activity in the mesentery just above this mass probably represents a local involved lymph node. 2. Moderately hypermetabolic ascending colon mass, maximum SUV 8.7. 3. Highly hypermetabolic enlarged portacaval lymph node, maximum SUV 16.7. 4. Focus of hypermetabolic activity near the duodenum bulb could be physiologic or due to an adjacent lymph node. 5. None of the liver lesions are discernibly hypermetabolic. Particularly the more cephalad right hepatic lobe lesion merits surveillance, it had nonspecific enhancement characteristics on prior cross-sectional imaging and only a thin enhancing wall which could conceivably predispose to false negative due to central necrosis. 6. Several tiny chronic pulmonary nodules are likely benign. 7. Other imaging findings of potential clinical significance: Aortic Atherosclerosis (ICD10-I70.0). Coronary atherosclerosis. Bilateral nonobstructive nephrolithiasis.      11/10/2016 Surgery    LAPAROSCOPIC EXTENDED RIGHT COLECTOMY by Dr. [name]      11/10/2016 Pathology Results    Diagnosis 11/10/16 Colon, segmental resection for tumor, Ascending and Transverse ADENOCARCINOMA OF THE ASCENDING COLON WITH EXTRA CELLULAR MUCIN, GRADE 3, SIZE 3.4 CM THE TUMOR INVADES MUSCULARIS PROPRIA POORLY DIFFERENTIATED ADENOCARCINOMA OF THE TRANSVERSE COLON, GRADE 4, SIZE 6.1 CM THE TUMOR INVADES THROUGH MUSCULARIS PROPRIA INTO PERICOLONIC SOFT TISSUE EXTRAMURAL SATELLITE TUMOR NODULES IS IDENTIFIED (X1) ALL MARGINS OF RESECTION ARE  NEGATIVE FOR  CARCINOMA METASTATIC CARCINOMA IN ONE OF SIXTY LYMPH NODES (1/60) TABULAR ADENOMA X1      12/28/2016 Imaging    MRI abdomen w/wo contrast IMPRESSION: Motion degraded images.  Two indeterminate right liver lesions measuring up to 1.6 cm, as described above. Continued attention on follow-up is suggested.  Two periportal lymph nodes measuring up to 1.9 cm, suspicious for nodal metastases.  Postsurgical changes related to right colon resection.       12/29/2016 -  Chemotherapy    FOLFOX every 2 weeks starting 12/29/16       02/03/2017 Procedure    EUS Per [name] Endoscopic Finding Findings: The examined esophagus was endoscopically normal. The entire examined stomach was endoscopically normal. The examined duodenum was endoscopically normal. Endosonographic Finding (limited evaluation for tissue acquisition): 1. One enlarged lymph node (vs soft tissue mass) was visualized in the porta hepatis region. It measured 16 mm in maximal cross-sectional diameter. The node (vs soft tissue) was oval, isoechoic and had poorly defined margins. Fine needle aspiration for cytology was performed. Color Doppler imaging was utilized prior to needle puncture to confirm a lack of significant vascular structures within the needle path. Threepasses were made with the 25 gauge needle using a transgastric approach. 2. CBD was normal, non-dilated 3. Limited views of the liver, spleen, pancreas were all normal.  Impression: One periportal soft tissue mass (lymphnode?) measuring 52m was noted and sampled with FNA.      02/03/2017 Pathology Results    Diagnosis FINE NEEDLE ASPIRATION, ENDOSCOPIC, PERI PORTAL NODE (SPECIMEN 1 OF 1 COLLECTED 02/03/17): NO MALIGNANT CELLS IDENTIFIED. SCANT LYMPHOID AND BENIGN GLANDULAR ELEMENTS.     CURRENT THERAPY: FOLFOX q2 weeks starting 12/29/16  INTERVAL HISTORY: Ms. GMolreturns today for follow-up as scheduled prior to cycle 5 FOLFOX chemo.  Status  post cycle 4 on 02/21/2017.  One week ago she developed urinary frequency, urgency and dysuria.  She went to her PCP on 03/03/2017 and started on Cipro.  She stopped all other medication while on cipro, she was worried they might interact. She was called recently and told to stop the antibiotic, has 2 doses remaining. Symptoms resolved.  She developed diarrhea after pump DC that lasted 4 days, had one large watery BM each day; she took imodium once without much relief. BM more formed now but still soft. No nausea or vomiting; eating and drinking well. 1 boost/ensure per day. Has occasional left LE/ankle swelling after exercise, none today; no calf pain. Has tingling in fingertips after treatment with cold temperatures. Stable tingling to her toes which was present prior to chemo. Does not limit function. Does not sleep well, occasionally takes benadryl which helps.    REVIEW OF SYSTEMS:   Constitutional: Denies fatigue, fevers, chills or abnormal weight loss Eyes: Denies blurriness of vision Ears, nose, mouth, throat, and face: Denies mucositis or sore throat (+) ongoing sensation of fluid in her ears Respiratory: Denies cough, dyspnea or wheezes Cardiovascular: Denies palpitation, chest discomfort (+) intermittent lower extremity swelling, left, after exercise  Gastrointestinal:  Denies nausea, vomiting, constipation, heartburn or change in bowel habits (+) diarrhea x4 days after pump d/c, 1 large episode per day Skin: Denies abnormal skin rashes Lymphatics: Denies new lymphadenopathy or easy bruising Neurological:Denies numbness,or new weaknesses (+) tingling to fingertips exacerbated by cold weather (+) stable tingling to feet, present prior to chemo  Behavioral/Psych: Mood is stable, no new changes (+) does not sleep well, improves with benadryl  All other systems  were reviewed with the patient and are negative.  MEDICAL HISTORY:  Past Medical History:  Diagnosis Date  . Anemia   . Arthritis     . Astigmatism    both eyes  . Cancer [facility])    colon surgery done last chemo tx 01-17-17  . Colon cancer ([place])   . Coronary artery disease, non-occlusive   . Dyspnea    more so exertion  . Dysrhythmia    skipped beats every now and then  . GERD (gastroesophageal reflux disease)   . Glaucoma    both eyes right eye worse than left  . Hammer toes of both feet   . Headache    migraines with aura  . Heel spur    both heels and front  . HSV (herpes simplex virus) infection   . Hyperglycemia   . Hyperlipidemia   . IBS (irritable bowel syndrome)   . IC (interstitial cystitis)   . Insomnia disorder related to known organic factor   . Osteoporosis   . PONV (postoperative nausea and vomiting)    has had more surgeries, and had no problems  . Stroke ([place]) 2016  . Vitamin D deficiency     SURGICAL HISTORY: Past Surgical History:  Procedure Laterality Date  . ABDOMINAL HYSTERECTOMY     1 ovary and womb removed  . CARDIAC CATHETERIZATION  7/12   non obst dz -- not sure what yr  . CHOLECYSTECTOMY    . EUS N/A 02/03/2017   Procedure: UPPER ENDOSCOPIC ULTRASOUND (EUS) LINEAR;  Surgeon: [name], MD;  Location: [facility];  Service: Endoscopy;  Laterality: N/A;  . EYE SURGERY Bilateral    ioc for cataracts   . LAPAROSCOPIC RIGHT COLECTOMY Right 11/10/2016   Procedure: LAPAROSCOPIC EXTENDED RIGHT COLECTOMY;  Surgeon: [name], MD;  Location: [facility];  Service: General;  Laterality: Right;  . PORTACATH PLACEMENT Left 12/22/2016   Procedure: INSERTION PORT-A-CATH;  Surgeon: [name], MD;  Location: [facility];  Service: General;  Laterality: Left;  . ROTATOR CUFF REPAIR Bilateral     I have reviewed the social history and family history with the patient and they are unchanged from previous note.  ALLERGIES:  is allergic to salmon [fish allergy]; ampicillin; codeine; flagyl [metronidazole]; meperidine hcl; neomycin; penicillins; propoxyphene hcl; sulfa  antibiotics; and tramadol hcl.  MEDICATIONS:  Current Outpatient Medications  Medication Sig Dispense Refill  . apixaban (ELIQUIS) 5 MG TABS tablet Take 1 tablet (5 mg total) by mouth 2 (two) times daily. (Patient not taking: Reported on 03/07/2017) 60 tablet 2  . atorvastatin (LIPITOR) 40 MG tablet Take 1 tablet (40 mg total) by mouth daily at 6 PM. (Patient not taking: Reported on 03/07/2017) 90 tablet 3  . b complex vitamins tablet Take 1 tablet by mouth daily.    . Ca Carbonate-Mag Hydroxide (ROLAIDS PO) Take 1-2 tablets by mouth every 8 (eight) hours as needed (for acid reflux).     . Cholecalciferol (VITAMIN D) 2000 units tablet Take 2,000 Units by mouth daily.    . ciprofloxacin (CIPRO) 250 MG tablet Take 1 tablet (250 mg total) by mouth 2 (two) times daily. (Patient not taking: Reported on 03/07/2017) 10 tablet 0  . famotidine (PEPCID) 20 MG tablet Take 1 tablet (20 mg total) by mouth 2 (two) times daily. (Patient not taking: Reported on 03/07/2017) 60 tablet 0  . gabapentin (NEURONTIN) 100 MG capsule Take 1 capsule (100 mg total) by mouth 2 (two) times daily as  needed. (Patient not taking: Reported on 03/07/2017) 30 capsule 1  . lactose free nutrition (BOOST) LIQD Take 237 mLs by mouth daily.    Marland Kitchen lidocaine-prilocaine (EMLA) cream Apply to affected area once (Patient not taking: Reported on 03/07/2017) 30 g 3  . Multiple Vitamin (MULTIVITAMIN WITH MINERALS) TABS tablet Take 1 tablet by mouth daily.    . ondansetron (ZOFRAN) 8 MG tablet Take 1 tablet (8 mg total) by mouth 2 (two) times daily as needed for refractory nausea / vomiting. Start on day 3 after chemotherapy. (Patient not taking: Reported on 03/07/2017) 30 tablet 1  . polyethylene glycol (MIRALAX / GLYCOLAX) packet Take 17 g by mouth daily. (Patient not taking: Reported on 03/07/2017) 14 each 0  . potassium chloride SA (K-DUR,KLOR-CON) 20 MEQ tablet Take 1 tablet (20 mEq total) by mouth daily. 30 tablet 1  . prochlorperazine (COMPAZINE)  10 MG tablet Take 1 tablet (10 mg total) by mouth every 6 (six) hours as needed (Nausea or vomiting). (Patient not taking: Reported on 03/07/2017) 30 tablet 1   No current facility-administered medications for this visit.     PHYSICAL EXAMINATION: ECOG PERFORMANCE STATUS: 1 - Symptomatic but completely ambulatory  Vitals:   03/07/17 1053 03/07/17 1200  BP: (!) 151/92 138/77  Pulse: 77   Resp: 17   Temp: 98 F (36.7 C)   SpO2: 99%   BP 138/77 on repeat Filed Weights   03/07/17 1053  Weight: 149 lb 3.2 oz (67.7 kg)    GENERAL:alert, no distress and comfortable SKIN: skin color, texture, turgor are normal, no rashes or significant lesions EYES: normal, Conjunctiva are pink and non-injected, sclera clear OROPHARYNX:no exudate, no erythema and lips, buccal mucosa, and tongue normal  NECK: supple, thyroid normal size, non-tender, without nodularity LYMPH:  no palpable cervical, supraclavicular, axillary, or inguinal lymphadenopathy LUNGS: clear to auscultation bilaterally with normal breathing effort HEART: regular rate & rhythm and no murmurs and no lower extremity edema ABDOMEN:abdomen soft, non-tender and normal bowel sounds. No hepatomegaly Musculoskeletal:no cyanosis of digits and no clubbing  NEURO: alert & oriented x 3 with fluent speech, no focal motor/sensory deficits PAC without erythema  LABORATORY DATA:  I have reviewed the data as listed CBC Latest Ref Rng & Units 03/07/2017 02/21/2017 02/07/2017  WBC 3.9 - 10.3 K/uL 3.9 4.4 4.5  Hemoglobin 11.6 - 15.9 g/dL 11.6 11.4(L) 11.3(L)  Hematocrit 34.8 - 46.6 % 34.8 35.5 35.4  Platelets 145 - 400 K/uL 75(L) 136(L) 303     CMP Latest Ref Rng & Units 03/07/2017 02/21/2017 02/07/2017  Glucose 70 - 140 mg/dL 132 134 89  BUN 7 - 26 mg/dL 14 9.7 17.5  Creatinine 0.60 - 1.10 mg/dL 0.81 0.9 0.9  Sodium 136 - 145 mmol/L 142 141 138  Potassium 3.3 - 4.7 mmol/L 3.2(L) 3.5 4.2  Chloride 98 - 109 mmol/L 107 - -  CO2 22 - 29 mmol/L  '26 24 23  ' Calcium 8.4 - 10.4 mg/dL 9.5 9.4 9.4  Total Protein 6.4 - 8.3 g/dL 6.7 6.5 7.1  Total Bilirubin 0.2 - 1.2 mg/dL 0.3 0.35 0.42  Alkaline Phos 40 - 150 U/L 78 86 90  AST 5 - 34 U/L '29 29 20  ' ALT 0 - 55 U/L 29 32 26   CEA 08/31/16: 2.0   PATHOLOGY REPORT:    Diagnosis 11/10/16 Colon, segmental resection for tumor, Ascending and Transverse ADENOCARCINOMA OF THE ASCENDING COLON WITH EXTRA CELLULAR MUCIN, GRADE 3, SIZE 3.4 CM THE TUMOR INVADES MUSCULARIS PROPRIA  POORLY DIFFERENTIATED ADENOCARCINOMA OF THE TRANSVERSE COLON, GRADE 4, SIZE 6.1 CM THE TUMOR INVADES THROUGH MUSCULARIS PROPRIA INTO PERICOLONIC SOFT TISSUE EXTRAMURAL SATELLITE TUMOR NODULES IS IDENTIFIED (X1) ALL MARGINS OF RESECTION ARE NEGATIVE FOR CARCINOMA METASTATIC CARCINOMA IN ONE OF SIXTY LYMPH NODES (1/60) TABULAR ADENOMA X1 Microscopic Comment COLON AND RECTUM (INCLUDING TRANS-ANAL RESECTION): Specimen: Right and transverse colon Procedure: Segmental resection Tumor site: Ascending and transverse colon Specimen integrity: Intact Macroscopic intactness of mesorectum: Not applicable: X Complete: NA Near complete: NA Incomplete: NA Cannot be determined (specify): NA Macroscopic tumor perforation: Pericolonic soft tissue Invasive tumor: Maximum size: 6.1 cm and 3.4 cm Histologic type(s): Adenocarcinoma Histologic grade and differentiation: G1: well differentiated/low grade Microscopic Comment(continued) G2: moderately differentiated/low grade G3: poorly differentiated/high grade G4: undifferentiated/high grade Type of polyp in which invasive carcinoma arose: Tubular adenoma Microscopic extension of invasive tumor: Pericolonic soft tissue Lymph-Vascular invasion: Identified Peri-neural invasion: Negative Tumor deposit(s) (discontinuous extramural extension): Present Resection margins: Proximal margin: Negative Distal margin: Negative Circumferential (radial) (posterior ascending, posterior  descending; lateral and posterior mid-rectum; and entire lower 1/3 rectum): Negative Mesenteric margin (sigmoid and transverse): Negative Distance closest margin (if all above margins negative): 1.5 cm from the mesenteric margin Trans-anal resection margins only: Deep margin: NA Mucosal Margin: NA Distance closest mucosal margin (if negative): NA Treatment effect (neo-adjuvant therapy): Negative Additional polyp(s): Tubular adenoma Non-neoplastic findings: Unremarkble Lymph nodes: number examined 60; number positive: 1 Pathologic Staging: pT3 (m), pN1c, pMx Ancillary studies: Ordered    Colon biopsy, 08/31/16 Diagnosis 1. Surgical [P], hepatic flexure - ADENOCARCINOMA. Moderately differentiated.  2. Surgical [P], transverse - ADENOCARCINOMA, Moderately differentiated.    RADIOGRAPHIC STUDIES: I have personally reviewed the radiological images as listed and agreed with the findings in the report. No results found.   ASSESSMENT & PLAN: [name], is a57 y.o.caucasian femalewith colorectal adenocarcinoma, moderately differentiated, found on screening colonoscopy.   1. Synchronized colon adenocarcinoma, moderately differentiated, in hepatic fracture and distal transverse colon, pT3(m)N1cMx,at leaststage IIIB, MSI-H 2. Genetics  3. Anemia, iron deficient  4. Liver lesions 5. History of stroke 6. ? HTN 7. Sinus drainage; ear fullness; positional dizziness/vertigo    8. Paresthesias - fingers and feet  9. UTI-like sx 10. Thrombocytopenia  Ms. Whan appears stable today, she has completed 4 cycles of adjuvant FOLFOX and tolerated well overall. she has 4-day history of diarrhea after last cycle; I recommend she maximize imodium, increase liquids, and avoid dehydration. Paresthesias and cold sensitivity is stable. She developed thrombocytopenia, plt 75k, will hold chemotherapy today and delay 1 week. Also with mild hypokalemia today K 3.16, she continues to have  soft BM daily. I recommend she begin oral K 20 mEq daily, prescription sent to pharmacy. Will monitor closely. She will return in 1 week for lab and cycle 5; will f/u with Dr. Burr Medico prior to cycle 6 FOLFOX in 3 weeks. Will obtain interim scan after 6th cycle to monitor indeterminate liver lesions and periportal nodes.   She developed dysuria, urinary frequency and urgency; she took 4 days of Cipro per PCP but stopped when culture did not grow bacteria. Symptoms now resolved. However, she stopped her normal meds including eliquis while she was on Cipro. I recommend she resume usual medications.   PLAN: -Delay FOLFOX 1 week due to thrombocytopenia, return in 1 week for cycle 5 adjuvant FOLFOX -Lab and f/u prior to cycle 6 FOLFOX in 3 weeks -Restaging CT AP w constrast after cycle 6 -Resume regular medications, begin oral K 20  mEq daily; Rx sent to pharmacy -Increase imodium for diarrhea   Orders Placed This Encounter  Procedures  . CT Abdomen Pelvis W Contrast    Standing Status:   Future    Standing Expiration Date:   03/07/2018    Order Specific Question:   If indicated for the ordered procedure, I authorize the administration of contrast media per Radiology protocol    Answer:   Yes    Order Specific Question:   Preferred imaging location?    Answer:   [facility]    Order Specific Question:   Radiology Contrast Protocol - do NOT remove file path    Answer:   [email or web address]    Order Specific Question:   Reason for Exam additional comments    Answer:   stage III colon cancer s/p resection with ongoing adjuvant chemotherapy, evaluate disease and monitor response to therapy   All questions were answered. The patient knows to call the clinic with any problems, questions or concerns. No barriers to learning was detected.     Alla Feeling, NP 03/07/17

## 2017-03-07 ENCOUNTER — Inpatient Hospital Stay: Payer: Medicare HMO

## 2017-03-07 ENCOUNTER — Telehealth: Payer: Self-pay | Admitting: Hematology

## 2017-03-07 ENCOUNTER — Encounter: Payer: Self-pay | Admitting: Nurse Practitioner

## 2017-03-07 ENCOUNTER — Inpatient Hospital Stay: Payer: Medicare HMO | Attending: Nurse Practitioner | Admitting: Nurse Practitioner

## 2017-03-07 VITALS — BP 138/77 | HR 77 | Temp 98.0°F | Resp 17 | Ht 64.0 in | Wt 149.2 lb

## 2017-03-07 DIAGNOSIS — C787 Secondary malignant neoplasm of liver and intrahepatic bile duct: Secondary | ICD-10-CM | POA: Diagnosis not present

## 2017-03-07 DIAGNOSIS — E876 Hypokalemia: Secondary | ICD-10-CM | POA: Insufficient documentation

## 2017-03-07 DIAGNOSIS — Z5111 Encounter for antineoplastic chemotherapy: Secondary | ICD-10-CM | POA: Diagnosis present

## 2017-03-07 DIAGNOSIS — D509 Iron deficiency anemia, unspecified: Secondary | ICD-10-CM | POA: Diagnosis not present

## 2017-03-07 DIAGNOSIS — C188 Malignant neoplasm of overlapping sites of colon: Secondary | ICD-10-CM

## 2017-03-07 DIAGNOSIS — R300 Dysuria: Secondary | ICD-10-CM | POA: Diagnosis not present

## 2017-03-07 DIAGNOSIS — D696 Thrombocytopenia, unspecified: Secondary | ICD-10-CM

## 2017-03-07 DIAGNOSIS — C189 Malignant neoplasm of colon, unspecified: Secondary | ICD-10-CM

## 2017-03-07 DIAGNOSIS — Z8673 Personal history of transient ischemic attack (TIA), and cerebral infarction without residual deficits: Secondary | ICD-10-CM | POA: Diagnosis not present

## 2017-03-07 DIAGNOSIS — D500 Iron deficiency anemia secondary to blood loss (chronic): Secondary | ICD-10-CM

## 2017-03-07 LAB — COMPREHENSIVE METABOLIC PANEL
ALT: 29 U/L (ref 0–55)
AST: 29 U/L (ref 5–34)
Albumin: 3.4 g/dL — ABNORMAL LOW (ref 3.5–5.0)
Alkaline Phosphatase: 78 U/L (ref 40–150)
Anion gap: 9 (ref 3–11)
BUN: 14 mg/dL (ref 7–26)
CO2: 26 mmol/L (ref 22–29)
Calcium: 9.5 mg/dL (ref 8.4–10.4)
Chloride: 107 mmol/L (ref 98–109)
Creatinine, Ser: 0.81 mg/dL (ref 0.60–1.10)
GFR calc Af Amer: >60 mL/min (ref >60)
GFR calc non Af Amer: >60 mL/min (ref >60)
Glucose, Bld: 132 mg/dL (ref 70–140)
Potassium: 3.2 mmol/L — ABNORMAL LOW (ref 3.3–4.7)
Sodium: 142 mmol/L (ref 136–145)
Total Bilirubin: 0.3 mg/dL (ref 0.2–1.2)
Total Protein: 6.7 g/dL (ref 6.4–8.3)

## 2017-03-07 LAB — FERRITIN: Ferritin: 699 ng/mL — ABNORMAL HIGH (ref 9–269)

## 2017-03-07 LAB — CBC WITH DIFFERENTIAL/PLATELET
Basophils Absolute: 0 10*3/uL (ref 0.0–0.1)
Basophils Relative: 1 %
Eosinophils Absolute: 0.1 10*3/uL (ref 0.0–0.5)
Eosinophils Relative: 3 %
HCT: 34.8 % (ref 34.8–46.6)
Hemoglobin: 11.6 g/dL (ref 11.6–15.9)
Lymphocytes Relative: 37 %
Lymphs Abs: 1.5 10*3/uL (ref 0.9–3.3)
MCH: 29.4 pg (ref 25.1–34.0)
MCHC: 33.3 g/dL (ref 31.5–36.0)
MCV: 88.1 fL (ref 79.5–101.0)
Monocytes Absolute: 0.6 10*3/uL (ref 0.1–0.9)
Monocytes Relative: 14 %
Neutro Abs: 1.8 10*3/uL (ref 1.5–6.5)
Neutrophils Relative %: 45 %
Platelets: 75 10*3/uL — ABNORMAL LOW (ref 145–400)
RBC: 3.95 MIL/uL (ref 3.70–5.45)
RDW: 18 % — ABNORMAL HIGH (ref 11.2–16.1)
WBC: 3.9 10*3/uL (ref 3.9–10.3)

## 2017-03-07 LAB — RETICULOCYTES
RBC.: 3.95 MIL/uL (ref 3.70–5.45)
Retic Count, Absolute: 51.4 10*3/uL (ref 33.7–90.7)
Retic Ct Pct: 1.3 % (ref 0.7–2.1)

## 2017-03-07 MED ORDER — POTASSIUM CHLORIDE CRYS ER 20 MEQ PO TBCR: 20.0000 meq | EXTENDED_RELEASE_TABLET | Freq: Every day | ORAL | 1 refills | Status: DC

## 2017-03-07 MED ORDER — SODIUM CHLORIDE 0.9% FLUSH
10.0000 mL | Freq: Once | INTRAVENOUS | Status: AC
  Administered 2017-03-07: 10 mL
  Filled 2017-03-07: qty 10

## 2017-03-07 NOTE — Telephone Encounter (Signed)
Gave avs and calendar for January and February had to move 1/21 to 1/22 cap in infusion

## 2017-03-09 ENCOUNTER — Emergency Department (HOSPITAL_COMMUNITY): Payer: Medicare HMO

## 2017-03-09 ENCOUNTER — Emergency Department (HOSPITAL_COMMUNITY)
Admission: EM | Admit: 2017-03-09 | Discharge: 2017-03-09 | Disposition: A | Discharge status: Home / Self Care | Payer: Medicare HMO | Attending: Emergency Medicine | Admitting: Emergency Medicine

## 2017-03-09 ENCOUNTER — Other Ambulatory Visit: Payer: Self-pay

## 2017-03-09 ENCOUNTER — Encounter (HOSPITAL_COMMUNITY): Payer: Self-pay | Admitting: Family Medicine

## 2017-03-09 DIAGNOSIS — Z882 Allergy status to sulfonamides status: Secondary | ICD-10-CM | POA: Diagnosis not present

## 2017-03-09 DIAGNOSIS — Z483 Aftercare following surgery for neoplasm: Secondary | ICD-10-CM | POA: Diagnosis not present

## 2017-03-09 DIAGNOSIS — R109 Unspecified abdominal pain: Secondary | ICD-10-CM | POA: Diagnosis not present

## 2017-03-09 DIAGNOSIS — R0781 Pleurodynia: Secondary | ICD-10-CM | POA: Diagnosis not present

## 2017-03-09 DIAGNOSIS — I119 Hypertensive heart disease without heart failure: Secondary | ICD-10-CM | POA: Diagnosis not present

## 2017-03-09 DIAGNOSIS — N390 Urinary tract infection, site not specified: Secondary | ICD-10-CM | POA: Insufficient documentation

## 2017-03-09 DIAGNOSIS — Z9221 Personal history of antineoplastic chemotherapy: Secondary | ICD-10-CM | POA: Insufficient documentation

## 2017-03-09 DIAGNOSIS — C188 Malignant neoplasm of overlapping sites of colon: Secondary | ICD-10-CM | POA: Diagnosis not present

## 2017-03-09 DIAGNOSIS — Z880 Allergy status to penicillin: Secondary | ICD-10-CM | POA: Diagnosis not present

## 2017-03-09 DIAGNOSIS — Z79899 Other long term (current) drug therapy: Secondary | ICD-10-CM | POA: Insufficient documentation

## 2017-03-09 DIAGNOSIS — R1084 Generalized abdominal pain: Secondary | ICD-10-CM | POA: Insufficient documentation

## 2017-03-09 DIAGNOSIS — I10 Essential (primary) hypertension: Secondary | ICD-10-CM | POA: Diagnosis not present

## 2017-03-09 DIAGNOSIS — M810 Age-related osteoporosis without current pathological fracture: Secondary | ICD-10-CM | POA: Diagnosis not present

## 2017-03-09 DIAGNOSIS — I82422 Acute embolism and thrombosis of left iliac vein: Secondary | ICD-10-CM | POA: Diagnosis not present

## 2017-03-09 DIAGNOSIS — R071 Chest pain on breathing: Secondary | ICD-10-CM | POA: Diagnosis not present

## 2017-03-09 DIAGNOSIS — K589 Irritable bowel syndrome without diarrhea: Secondary | ICD-10-CM | POA: Diagnosis not present

## 2017-03-09 DIAGNOSIS — I2510 Atherosclerotic heart disease of native coronary artery without angina pectoris: Secondary | ICD-10-CM | POA: Diagnosis not present

## 2017-03-09 DIAGNOSIS — E559 Vitamin D deficiency, unspecified: Secondary | ICD-10-CM | POA: Diagnosis not present

## 2017-03-09 DIAGNOSIS — M545 Low back pain: Secondary | ICD-10-CM | POA: Diagnosis not present

## 2017-03-09 DIAGNOSIS — M6281 Muscle weakness (generalized): Secondary | ICD-10-CM | POA: Diagnosis not present

## 2017-03-09 DIAGNOSIS — D509 Iron deficiency anemia, unspecified: Secondary | ICD-10-CM | POA: Diagnosis not present

## 2017-03-09 DIAGNOSIS — R079 Chest pain, unspecified: Secondary | ICD-10-CM | POA: Diagnosis present

## 2017-03-09 LAB — URINALYSIS, ROUTINE W REFLEX MICROSCOPIC
Bilirubin Urine: NEGATIVE
Glucose, UA: NEGATIVE mg/dL
Hgb urine dipstick: NEGATIVE
Ketones, ur: NEGATIVE mg/dL
Leukocytes, UA: NEGATIVE
Nitrite: NEGATIVE
Protein, ur: NEGATIVE mg/dL
Specific Gravity, Urine: 1.011 (ref 1.005–1.030)
pH: 7 (ref 5.0–8.0)

## 2017-03-09 LAB — COMPREHENSIVE METABOLIC PANEL
ALT: 30 U/L (ref 14–54)
AST: 31 U/L (ref 15–41)
Albumin: 3.8 g/dL (ref 3.5–5.0)
Alkaline Phosphatase: 82 U/L (ref 38–126)
Anion gap: 8 (ref 5–15)
BUN: 13 mg/dL (ref 6–20)
CO2: 25 mmol/L (ref 22–32)
Calcium: 9 mg/dL (ref 8.9–10.3)
Chloride: 104 mmol/L (ref 101–111)
Creatinine, Ser: 0.64 mg/dL (ref 0.44–1.00)
GFR calc Af Amer: >60 mL/min (ref >60)
GFR calc non Af Amer: >60 mL/min (ref >60)
Glucose, Bld: 92 mg/dL (ref 65–99)
Potassium: 3.5 mmol/L (ref 3.5–5.1)
Sodium: 137 mmol/L (ref 135–145)
Total Bilirubin: 0.4 mg/dL (ref 0.3–1.2)
Total Protein: 7 g/dL (ref 6.5–8.1)

## 2017-03-09 LAB — CBC WITH DIFFERENTIAL/PLATELET
Basophils Absolute: 0 10*3/uL (ref 0.0–0.1)
Basophils Relative: 0 %
Eosinophils Absolute: 0.1 10*3/uL (ref 0.0–0.7)
Eosinophils Relative: 2 %
HCT: 35 % — ABNORMAL LOW (ref 36.0–46.0)
Hemoglobin: 11.6 g/dL — ABNORMAL LOW (ref 12.0–15.0)
Lymphocytes Relative: 38 %
Lymphs Abs: 1.7 10*3/uL (ref 0.7–4.0)
MCH: 29.1 pg (ref 26.0–34.0)
MCHC: 33.1 g/dL (ref 30.0–36.0)
MCV: 87.9 fL (ref 78.0–100.0)
Monocytes Absolute: 1 10*3/uL (ref 0.1–1.0)
Monocytes Relative: 21 %
Neutro Abs: 1.8 10*3/uL (ref 1.7–7.7)
Neutrophils Relative %: 39 %
Platelets: 91 10*3/uL — ABNORMAL LOW (ref 150–400)
RBC: 3.98 MIL/uL (ref 3.87–5.11)
RDW: 18.2 % — ABNORMAL HIGH (ref 11.5–15.5)
WBC: 4.6 10*3/uL (ref 4.0–10.5)

## 2017-03-09 LAB — I-STAT TROPONIN, ED: Troponin i, poc: 0.01 ng/mL (ref 0.00–0.08)

## 2017-03-09 LAB — LIPASE, BLOOD: Lipase: 47 U/L (ref 11–51)

## 2017-03-09 MED ORDER — HEPARIN SOD (PORK) LOCK FLUSH 100 UNIT/ML IV SOLN
500.0000 [IU] | Freq: Once | INTRAVENOUS | Status: AC
  Administered 2017-03-09: 500 [IU]
  Filled 2017-03-09: qty 5

## 2017-03-09 MED ORDER — IOPAMIDOL (ISOVUE-370) INJECTION 76%
INTRAVENOUS | Status: AC
  Administered 2017-03-09: 100 mL
  Filled 2017-03-09: qty 100

## 2017-03-09 NOTE — Discharge Instructions (Signed)
Lab work, urine, CT scans were normal today. The reason for your pain is still unclear.   Please follow up with primary care doctor in 24-48 hours for re-evaluation. Return for fevers, chest pain, shortness of breath, worsening pain, abdominal pain, urinary symptoms.

## 2017-03-09 NOTE — ED Provider Notes (Signed)
[facility] DEPT Provider Note   CSN: [id number] Arrival date & time: 03/09/17  1044     History   Chief Complaint Chief Complaint  Patient presents with  . Flank Pain    HPI [name] is a 77 y.o. female history of colon cancer currently onl FOLFOX chemotherapy, DVT on Eloquis, HLD presents for evaluation of posterior right rib cage pain that radiates down to right lower back and around right mid abdomen for 4 days. Worse with deep breathing, palpation and movement. Has tried heating pad, gabapentin and rest without relief. Missed Eloquis x 6 days recently. Last week was diagnosed with a urinary tract infection and started on ciprofloxacin, PCP called her back and told her her urine culture was negative and stop the antibiotic. Dysuria, frequency, lower abdominal discomfort resolved after ciprofloxacin.   She denies fevers, chest pain, shortness of breath, cough, nausea, vomiting, urinary symptoms, abnormal vaginal discharge or bleeding, diarrhea or constipation. Has had chills since starting chemotherapy without recent change.  HPI  Past Medical History:  Diagnosis Date  . Anemia   . Arthritis   . Astigmatism    both eyes  . Cancer [facility])    colon surgery done last chemo tx 01-17-17  . Colon cancer ([place])   . Coronary artery disease, non-occlusive   . Dyspnea    more so exertion  . Dysrhythmia    skipped beats every now and then  . GERD (gastroesophageal reflux disease)   . Glaucoma    both eyes right eye worse than left  . Hammer toes of both feet   . Headache    migraines with aura  . Heel spur    both heels and front  . HSV (herpes simplex virus) infection   . Hyperglycemia   . Hyperlipidemia   . IBS (irritable bowel syndrome)   . IC (interstitial cystitis)   . Insomnia disorder related to known organic factor   . Osteoporosis   . PONV (postoperative nausea and vomiting)    has had more surgeries, and had no problems    . Stroke ([place]) 2016  . Vitamin D deficiency     Patient Active Problem List   Diagnosis Date Noted  . Adenopathy   . UTI (urinary tract infection) 12/15/2016  . Acute deep vein thrombosis (DVT) of proximal vein of left lower extremity ([place]) 11/24/2016  . Cancer of overlapping sites of colon ([place]) 09/23/2016  . Iron deficiency anemia due to chronic blood loss 09/23/2016  . Estrogen deficiency 07/30/2016  . Screening mammogram, encounter for 07/30/2016  . Routine general medical examination at a health care facility 07/27/2016  . Right buttock pain 06/27/2015  . Accidental fall 06/27/2015  . Left hip pain 06/27/2015  . Stroke (cerebrum) ([place]) 05/06/2015  . HTN (hypertension) 05/06/2015  . HLD (hyperlipidemia) 05/06/2015  . GERD (gastroesophageal reflux disease) 05/06/2015  . CAD (coronary artery disease) 05/06/2015  . Insect bites 09/17/2014  . Joint pain 05/01/2013  . Pain in joint, ankle and foot 03/02/2013  . Seborrheic keratosis 07/05/2012  . Ankle joint pain 07/16/2011  . 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 Callus of foot 10/16/2010  . Coronary artery disease, non-occlusive 10/16/2010  . INSOMNIA 02/02/2010  . HAND PAIN, BILATERAL 01/09/2010  . CRAMP OF LIMB 01/09/2010  . HYPERGLYCEMIA 07/29/2009  . ALLERGIC RHINITIS 12/04/2008  . Vitamin D deficiency 04/30/2008  . COLONIC POLYPS 06/28/2007  . IBS 03/17/2007  . INTERSTITIAL CYSTITIS 03/17/2007  . Osteoporosis 03/17/2007  Past Surgical History:  Procedure Laterality Date  . ABDOMINAL HYSTERECTOMY     1 ovary and womb removed  . CARDIAC CATHETERIZATION  7/12   non obst dz -- not sure what yr  . CHOLECYSTECTOMY    . EUS N/A 02/03/2017   Procedure: UPPER ENDOSCOPIC ULTRASOUND (EUS) LINEAR;  Surgeon: [name], MD;  Location: [facility];  Service: Endoscopy;  Laterality: N/A;  . EYE SURGERY Bilateral    ioc for cataracts   . LAPAROSCOPIC RIGHT COLECTOMY Right 11/10/2016   Procedure: LAPAROSCOPIC EXTENDED RIGHT  COLECTOMY;  Surgeon: [name], MD;  Location: [facility];  Service: General;  Laterality: Right;  . PORTACATH PLACEMENT Left 12/22/2016   Procedure: INSERTION PORT-A-CATH;  Surgeon: [name], MD;  Location: [facility];  Service: General;  Laterality: Left;  . ROTATOR CUFF REPAIR Bilateral     OB History    No data available       Home Medications    Prior to Admission medications   Medication Sig Start Date End Date Taking? Authorizing Provider  apixaban (ELIQUIS) 5 MG TABS tablet Take 1 tablet (5 mg total) by mouth 2 (two) times daily. 01/12/17  Yes [name], MD  atorvastatin (LIPITOR) 40 MG tablet Take 1 tablet (40 mg total) by mouth daily at 6 PM. 07/30/16  Yes Tower, [name], MD  b complex vitamins tablet Take 1 tablet by mouth daily.   Yes [provider]  bimatoprost (LUMIGAN) 0.01 % SOLN Place 1 drop into both eyes daily.   Yes [provider]  Ca Carbonate-Mag Hydroxide (ROLAIDS PO) Take 1-2 tablets by mouth every 8 (eight) hours as needed (for acid reflux).    Yes [provider]  Cholecalciferol (VITAMIN D) 2000 units tablet Take 2,000 Units by mouth daily.   Yes [provider]  famotidine (PEPCID) 20 MG tablet Take 1 tablet (20 mg total) by mouth 2 (two) times daily. 11/30/16  Yes Patrecia Pour, Christean Grief, MD  gabapentin (NEURONTIN) 100 MG capsule Take 1 capsule (100 mg total) by mouth 2 (two) times daily as needed. Patient taking differently: Take 100 mg by mouth 2 (two) times daily as needed (pain).  11/22/16  Yes [name], MD  Ibuprofen-Diphenhydramine Cit (IBUPROFEN PM) 200-38 MG TABS Take 1 tablet by mouth daily.   Yes [provider]  lactose free nutrition (BOOST) LIQD Take 237 mLs by mouth daily.   Yes [provider]  lidocaine-prilocaine (EMLA) cream Apply to affected area once 12/18/16  Yes [name], MD  Multiple Vitamin (MULTIVITAMIN WITH MINERALS) TABS tablet Take 1 tablet by mouth daily.    Yes [provider]  potassium chloride SA (K-DUR,KLOR-CON) 20 MEQ tablet Take 1 tablet (20 mEq total) by mouth daily. 03/07/17  Yes Alla Feeling, NP  ciprofloxacin (CIPRO) 250 MG tablet Take 1 tablet (250 mg total) by mouth 2 (two) times daily. Patient not taking: Reported on 03/07/2017 03/03/17   Jearld Fenton, NP  COMBIGAN 0.2-0.5 % ophthalmic solution  03/04/17   [provider]  ondansetron (ZOFRAN) 8 MG tablet Take 1 tablet (8 mg total) by mouth 2 (two) times daily as needed for refractory nausea / vomiting. Start on day 3 after chemotherapy. 12/18/16   [name], MD  polyethylene glycol [facility]) packet Take 17 g by mouth daily. Patient taking differently: Take 17 g by mouth daily as needed for moderate constipation.  12/01/16   Doreatha Lew, MD  prochlorperazine (COMPAZINE)  10 MG tablet Take 1 tablet (10 mg total) by mouth every 6 (six) hours as needed (Nausea or vomiting). 12/18/16   [name], MD    Family History Family History  Problem Relation Age of Onset  . Alcohol abuse Mother   . Heart attack Mother   . GER disease Mother   . GER disease Father   . Alcohol abuse Father   . Stroke Father   . Tuberculosis Father   . Heart attack Father   . Aneurysm Father   . Mental illness Sister   . Depression Sister   . Breast cancer Neg Hx     Social History Social History   Tobacco Use  . Smoking status: Never Smoker  . Smokeless tobacco: Never Used  Substance Use Topics  . Alcohol use: No    Alcohol/week: 0.0 oz  . Drug use: No     Allergies   Salmon [fish allergy]; Ampicillin; Codeine; Flagyl [metronidazole]; Meperidine hcl; Neomycin; Penicillins; Propoxyphene hcl; Sulfa antibiotics; and Tramadol hcl   Review of Systems Review of Systems  Cardiovascular:       +right lower rib cage pain to flank and right mid abdomen  Allergic/Immunologic: Positive for immunocompromised state.  All other systems reviewed and are  negative.    Physical Exam Updated Vital Signs BP 119/78   Pulse 87   Temp 98 F (36.7 C) (Oral)   Resp 16   Ht 5\' 4"  (1.626 m)   Wt 67.7 kg (149 lb 3 oz)   SpO2 100%   BMI 25.61 kg/m   Physical Exam  Constitutional: She is oriented to person, place, and time. She appears well-developed and well-nourished. No distress.  Non toxic  HENT:  Head: Normocephalic and atraumatic.  Nose: Nose normal.  Mouth/Throat: No oropharyngeal exudate.  Moist mucous membranes   Eyes: Conjunctivae and EOM are normal. Pupils are equal, round, and reactive to light.  Neck: Normal range of motion.  Cardiovascular: Normal rate, regular rhythm and intact distal pulses.  No murmur heard. 2+ DP and radial pulses bilaterally. No LE edema.   Pulmonary/Chest: Effort normal and breath sounds normal. No respiratory distress. She has no wheezes. She has no rales. She exhibits tenderness.  TTP to right posterior/lateral rib cage with percussion and deep breaths  Abdominal: Soft. Bowel sounds are normal. There is tenderness.  TTP and percussion over right CVA, right posterior/lateral rib cage and right thoracic musculature. No G/R/R. No suprapubic tenderness  Musculoskeletal: Normal range of motion. She exhibits no deformity.       Back:  Neurological: She is alert and oriented to person, place, and time.  Skin: Skin is warm and dry. Capillary refill takes less than 2 seconds.  Psychiatric: She has a normal mood and affect. Her behavior is normal. Judgment and thought content normal.  Nursing note and vitals reviewed.    ED Treatments / Results  Labs (all labs ordered are listed, but only abnormal results are displayed) Labs Reviewed  CBC WITH DIFFERENTIAL/PLATELET - Abnormal; Notable for the following components:      Result Value   Hemoglobin 11.6 (*)    HCT 35.0 (*)    RDW 18.2 (*)    Platelets 91 (*)    All other components within normal limits  URINE CULTURE  URINALYSIS, ROUTINE W REFLEX  MICROSCOPIC  COMPREHENSIVE METABOLIC PANEL  LIPASE, BLOOD  I-STAT TROPONIN, ED    EKG  EKG Interpretation None       Radiology Dg Chest  2 View  Result Date: 03/09/2017 CLINICAL DATA:  Right posterior rib and flank pain. EXAM: CHEST  2 VIEW COMPARISON:  12/22/2016 FINDINGS: Left chest wall port a catheter identified with tip in the projection of the SVC. Normal heart size. No pleural effusion or edema. No airspace opacities identified. No displaced rib fractures. Chronic healed right posterolateral seventh rib fracture noted. IMPRESSION: 1. No acute cardiopulmonary abnormalities. No displaced rib fractures. Electronically Signed   By: Kerby Moors M.D.   On: 03/09/2017 13:55   Ct Angio Chest Pe W And/or Wo Contrast  Result Date: 03/09/2017 CLINICAL DATA:  Right-sided rib and chest pain with deep inspiration beginning today. EXAM: CT ANGIOGRAPHY CHEST WITH CONTRAST TECHNIQUE: Multidetector CT imaging of the chest was performed using the standard protocol during bolus administration of intravenous contrast. Multiplanar CT image reconstructions and MIPs were obtained to evaluate the vascular anatomy. CONTRAST:  80 cc Isovue 370 COMPARISON:  Chest radiography same day.  Previous CT 11/23/2016. FINDINGS: Cardiovascular: Pulmonary arterial opacification is excellent. There are no pulmonary emboli. There is aortic atherosclerosis without evidence of aneurysm or dissection. There is coronary artery calcification. Heart size is normal. Mediastinum/Nodes: No mass or lymphadenopathy. Lungs/Pleura: Apical pleural and parenchymal scarring. No evidence of mass, infiltrate or collapse. Mild linear basilar scarring. No pleural fluid. Upper Abdomen: No acute or significant upper abdominal finding. Musculoskeletal: Curvature in chronic degenerative changes of the thoracic spine. No evidence of recent rib fracture. Old healed minor right seventh rib fracture. Review of the MIP images confirms the above findings.  IMPRESSION: No cause of the presenting symptoms is identified. No pulmonary emboli. Coronary artery calcification.  Mild pulmonary scarring. Aortic Atherosclerosis (ICD10-I70.0). Electronically Signed   By: Nelson Chimes M.D.   On: 03/09/2017 17:42   Ct Abdomen Pelvis W Contrast  Result Date: 03/09/2017 CLINICAL DATA:  Right-sided abdominal pain beginning today. Indeterminate liver lesions on recent CT. Approximately 4 months status post right colectomy for colon carcinoma. EXAM: CT ABDOMEN AND PELVIS WITH CONTRAST TECHNIQUE: Multidetector CT imaging of the abdomen and pelvis was performed using the standard protocol following bolus administration of intravenous contrast. CONTRAST:  146mL ISOVUE-370 IOPAMIDOL (ISOVUE-370) INJECTION 76% COMPARISON:  Prior MRI on 09/15/2016 and CT on 09/01/2016 FINDINGS: Lower Chest: No acute findings. Hepatobiliary: Subcentimeter low-attenuation lesion in superior right hepatic lobe has decreased in size since previous study. Previously seen sub-cm low-attenuation lesions in the medial posterior right hepatic lobe are no longer visualized. Low-attenuation lesion in the inferior right hepatic lobe measures 2.0 x 1.2 cm cm on image 27/2, the without significant change. No Yvonne or enlarging liver lesions identified. Prior cholecystectomy. No evidence of biliary obstruction. Pancreas:  No mass or inflammatory changes. Spleen: Within normal limits in size and appearance. Adrenals/Urinary Tract: No masses identified. Several small bilateral renal cysts are stable. No evidence of hydronephrosis. Unremarkable unopacified urinary bladder. Stomach/Bowel: Postop changes from right hemicolectomy. No evidence of dilated bowel loops, wall thickening, or inflammatory process. Vascular/Lymphatic: No pathologically enlarged lymph nodes. 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 No abdominal aortic aneurysm. Aortic atherosclerosis. Reproductive: Prior hysterectomy noted. Adnexal regions are unremarkable in appearance. Other:  None. Musculoskeletal:  No suspicious bone lesions identified. IMPRESSION: Interval decrease in several tiny low-attenuation liver lesions the, consistent with improving hepatic metastases. 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 Electronically Signed  By: Earle Gell M.D.   On: 03/09/2017 18:24    Procedures Procedures (including critical care time)  Medications Ordered in ED Medications  iopamidol (ISOVUE-370) 76 % injection (100 mLs  Contrast Given 03/09/17 1713)     Initial Impression / Assessment and Plan / ED Course  I have reviewed the triage vital signs and the nursing notes.  Pertinent labs & imaging results that were available during my care of the patient were reviewed by me and considered in my medical decision making (see chart for details).  Clinical Course as of Mar 09 1900  Wed Mar 09, 2017  1854 Discussed ED work up with patient. She is eager to go home. Re-evaluated patient, pain reproducible with palpation to right flank/lateral waist line. She is ambulatory w/o pain. Successful PO challenge.   [CG]    Clinical Course User Index [CG] Kinnie Feil, PA-C   77 year old female with history of colon cancer in DVT on Eloquis here for pleuritic pain to right posterior/lateral rib cage that radiates down right lower back and right mid abdomen for 4 days. Missed Eloquis x 6 days. Recently treated with ciprofloxacin for urinary tract infection, urinary symptoms resolved.   On exam, she is has reproducible tenderness to areas mentioned above with palpation, percussion and deep breaths. No LE edema or calf tenderness. No peritoneal signs.   Concern for pulmonary embolism, pneumonia, pyelonephritis or kidney stone.  Less likely atypical ACS.  She is  s/p cholecystectomy. We'll obtain screening labs and reassess. Final Clinical Impressions(s) / ED Diagnoses   ED work up including CBC, CMP, lipase, U/A, trop x 1, EKG, CXR, CTA PE and CT AP unremarkable. VS have remained WNL. She has tolerated PO challenge and ambulatory in ED. Re-evaluation mostly unchanged, she has reproducible TTP to right flank and lateral waist line. Discussed work up results with pt. She is eager to go home. Does not want any narcotic pain medicines. Will d/c at this time given reassuring work up. Encouraged her to f/u with PCP for re-eval of symptoms in 24-48 hours. She verbalized understanding and agreebale with plan. Urine culture sent.  Final diagnoses:  Flank pain    ED Discharge Orders    None       Arlean Hopping 03/09/17 Fidela Salisbury, MD 03/10/17 [phone]

## 2017-03-09 NOTE — ED Triage Notes (Signed)
Patient has colon cancer with currently taking chemo. Patient is complaining of bilateral flank pain with it being more intense on the right side. Patient reports mid/lower about 3 days ago and now it has moved to her flank area. Patient was diagnosed 5 days ago and took Cipro for 4 days until the doctor's office called her back to inform her that the culture didn't result with any reason to take medication. Denies fever but reports she currently does not have urgency or frequency as in the previous days.

## 2017-03-10 LAB — URINE CULTURE

## 2017-03-14 ENCOUNTER — Other Ambulatory Visit: Payer: Self-pay | Admitting: Hematology

## 2017-03-14 DIAGNOSIS — C182 Malignant neoplasm of ascending colon: Secondary | ICD-10-CM | POA: Diagnosis not present

## 2017-03-14 DIAGNOSIS — C184 Malignant neoplasm of transverse colon: Secondary | ICD-10-CM | POA: Diagnosis not present

## 2017-03-14 DIAGNOSIS — Z86010 Personal history of colonic polyps: Secondary | ICD-10-CM | POA: Diagnosis not present

## 2017-03-15 ENCOUNTER — Inpatient Hospital Stay: Payer: Medicare HMO

## 2017-03-15 ENCOUNTER — Encounter: Payer: Self-pay | Admitting: Nurse Practitioner

## 2017-03-15 ENCOUNTER — Inpatient Hospital Stay: Payer: Medicare HMO | Admitting: Nurse Practitioner

## 2017-03-15 VITALS — BP 147/76 | HR 79 | Temp 97.9°F | Resp 20 | Ht 64.0 in | Wt 151.4 lb

## 2017-03-15 DIAGNOSIS — C188 Malignant neoplasm of overlapping sites of colon: Secondary | ICD-10-CM

## 2017-03-15 DIAGNOSIS — C189 Malignant neoplasm of colon, unspecified: Secondary | ICD-10-CM

## 2017-03-15 DIAGNOSIS — R300 Dysuria: Secondary | ICD-10-CM | POA: Diagnosis not present

## 2017-03-15 DIAGNOSIS — Z5111 Encounter for antineoplastic chemotherapy: Secondary | ICD-10-CM | POA: Diagnosis not present

## 2017-03-15 DIAGNOSIS — I10 Essential (primary) hypertension: Secondary | ICD-10-CM | POA: Diagnosis not present

## 2017-03-15 DIAGNOSIS — D509 Iron deficiency anemia, unspecified: Secondary | ICD-10-CM | POA: Diagnosis not present

## 2017-03-15 DIAGNOSIS — E876 Hypokalemia: Secondary | ICD-10-CM

## 2017-03-15 DIAGNOSIS — Z8673 Personal history of transient ischemic attack (TIA), and cerebral infarction without residual deficits: Secondary | ICD-10-CM

## 2017-03-15 DIAGNOSIS — C787 Secondary malignant neoplasm of liver and intrahepatic bile duct: Secondary | ICD-10-CM

## 2017-03-15 LAB — CBC WITH DIFFERENTIAL/PLATELET
Basophils Absolute: 0 10*3/uL (ref 0.0–0.1)
Basophils Relative: 1 %
Eosinophils Absolute: 0.1 10*3/uL (ref 0.0–0.5)
Eosinophils Relative: 3 %
HCT: 35.5 % (ref 34.8–46.6)
Hemoglobin: 11.6 g/dL (ref 11.6–15.9)
Lymphocytes Relative: 38 %
Lymphs Abs: 1.5 10*3/uL (ref 0.9–3.3)
MCH: 29.1 pg (ref 25.1–34.0)
MCHC: 32.7 g/dL (ref 31.5–36.0)
MCV: 89.2 fL (ref 79.5–101.0)
Monocytes Absolute: 0.6 10*3/uL (ref 0.1–0.9)
Monocytes Relative: 16 %
Neutro Abs: 1.7 10*3/uL (ref 1.5–6.5)
Neutrophils Relative %: 42 %
Platelets: 146 10*3/uL (ref 145–400)
RBC: 3.98 MIL/uL (ref 3.70–5.45)
RDW: 18.5 % — ABNORMAL HIGH (ref 11.2–16.1)
WBC: 3.9 10*3/uL (ref 3.9–10.3)

## 2017-03-15 LAB — COMPREHENSIVE METABOLIC PANEL
ALT: 33 U/L (ref 0–55)
AST: 32 U/L (ref 5–34)
Albumin: 3.4 g/dL — ABNORMAL LOW (ref 3.5–5.0)
Alkaline Phosphatase: 93 U/L (ref 40–150)
Anion gap: 8 (ref 3–11)
BUN: 13 mg/dL (ref 7–26)
CO2: 25 mmol/L (ref 22–29)
Calcium: 9.5 mg/dL (ref 8.4–10.4)
Chloride: 107 mmol/L (ref 98–109)
Creatinine, Ser: 0.88 mg/dL (ref 0.60–1.10)
GFR calc Af Amer: >60 mL/min (ref >60)
GFR calc non Af Amer: >60 mL/min (ref >60)
Glucose, Bld: 96 mg/dL (ref 70–140)
Potassium: 3.8 mmol/L (ref 3.3–4.7)
Sodium: 140 mmol/L (ref 136–145)
Total Bilirubin: 0.3 mg/dL (ref 0.2–1.2)
Total Protein: 6.7 g/dL (ref 6.4–8.3)

## 2017-03-15 LAB — RETICULOCYTES
RBC.: 3.98 MIL/uL (ref 3.70–5.45)
Retic Count, Absolute: 55.7 10*3/uL (ref 33.7–90.7)
Retic Ct Pct: 1.4 % (ref 0.7–2.1)

## 2017-03-15 MED ORDER — LEUCOVORIN CALCIUM INJECTION 350 MG
400.0000 mg/m2 | Freq: Once | INTRAVENOUS | Status: AC
  Administered 2017-03-15: 688 mg via INTRAVENOUS
  Filled 2017-03-15: qty 34.4

## 2017-03-15 MED ORDER — FAMOTIDINE 20 MG PO TABS: 20.0000 mg | ORAL_TABLET | Freq: Every day | ORAL | 0 refills | Status: DC

## 2017-03-15 MED ORDER — DEXTROSE 5 % IV SOLN
Freq: Once | INTRAVENOUS | Status: AC
  Administered 2017-03-15: 11:00:00 via INTRAVENOUS

## 2017-03-15 MED ORDER — OXALIPLATIN CHEMO INJECTION 100 MG/20ML
85.0000 mg/m2 | Freq: Once | INTRAVENOUS | Status: AC
  Administered 2017-03-15: 145 mg via INTRAVENOUS
  Filled 2017-03-15: qty 20

## 2017-03-15 MED ORDER — PALONOSETRON HCL INJECTION 0.25 MG/5ML
0.2500 mg | Freq: Once | INTRAVENOUS | Status: AC
  Administered 2017-03-15: 0.25 mg via INTRAVENOUS

## 2017-03-15 MED ORDER — SODIUM CHLORIDE 0.9 % IV SOLN
2400.0000 mg/m2 | INTRAVENOUS | Status: DC
  Administered 2017-03-15: 4150 mg via INTRAVENOUS
  Filled 2017-03-15: qty 83

## 2017-03-15 MED ORDER — SODIUM CHLORIDE 0.9% FLUSH
10.0000 mL | Freq: Once | INTRAVENOUS | Status: AC
  Administered 2017-03-15: 10 mL
  Filled 2017-03-15: qty 10

## 2017-03-15 MED ORDER — SODIUM CHLORIDE 0.9 % IV SOLN
Freq: Once | INTRAVENOUS | Status: AC
  Administered 2017-03-15: 12:00:00 via INTRAVENOUS
  Filled 2017-03-15: qty 5

## 2017-03-15 MED ORDER — PALONOSETRON HCL INJECTION 0.25 MG/5ML
INTRAVENOUS | Status: AC
  Filled 2017-03-15: qty 5

## 2017-03-15 NOTE — Patient Instructions (Signed)
Cancer Center Discharge Instructions for Patients Receiving Chemotherapy  Today you received the following chemotherapy agents Oxaliplatin, Leucovorin, 5FU.    To help prevent nausea and vomiting after your treatment, we encourage you to take your nausea medication as prescribed.   If you develop nausea and vomiting that is not controlled by your nausea medication, call the clinic.   BELOW ARE SYMPTOMS THAT SHOULD BE REPORTED IMMEDIATELY:  *FEVER GREATER THAN 100.5 F  *CHILLS WITH OR WITHOUT FEVER  NAUSEA AND VOMITING THAT IS NOT CONTROLLED WITH YOUR NAUSEA MEDICATION  *UNUSUAL SHORTNESS OF BREATH  *UNUSUAL BRUISING OR BLEEDING  TENDERNESS IN MOUTH AND THROAT WITH OR WITHOUT PRESENCE OF ULCERS  *URINARY PROBLEMS  *BOWEL PROBLEMS  UNUSUAL RASH Items with * indicate a potential emergency and should be followed up as soon as possible.  Feel free to call the clinic should you have any questions or concerns. The clinic phone number is [phone].  Please show the CHEMO ALERT CARD at check-in to the Emergency Department and triage nurse.   

## 2017-03-15 NOTE — Patient Instructions (Signed)
Implanted Port Home Guide An implanted port is a type of central line that is placed under the skin. Central lines are used to provide IV access when treatment or nutrition needs to be given through a person's veins. Implanted ports are used for long-term IV access. An implanted port may be placed because:  You need IV medicine that would be irritating to the small veins in your hands or arms.  You need long-term IV medicines, such as antibiotics.  You need IV nutrition for a long period.  You need frequent blood draws for lab tests.  You need dialysis.  Implanted ports are usually placed in the chest area, but they can also be placed in the upper arm, the abdomen, or the leg. An implanted port has two main parts:  Reservoir. The reservoir is round and will appear as a small, raised area under your skin. The reservoir is the part where a needle is inserted to give medicines or draw blood.  Catheter. The catheter is a thin, flexible tube that extends from the reservoir. The catheter is placed into a large vein. Medicine that is inserted into the reservoir goes into the catheter and then into the vein.  How will I care for my incision site? Do not get the incision site wet. Bathe or shower as directed by your health care provider. How is my port accessed? Special steps must be taken to access the port:  Before the port is accessed, a numbing cream can be placed on the skin. This helps numb the skin over the port site.  Your health care provider uses a sterile technique to access the port. ? Your health care provider must put on a mask and sterile gloves. ? The skin over your port is cleaned carefully with an antiseptic and allowed to dry. ? The port is gently pinched between sterile gloves, and a needle is inserted into the port.  Only "non-coring" port needles should be used to access the port. Once the port is accessed, a blood return should be checked. This helps ensure that the port  is in the vein and is not clogged.  If your port needs to remain accessed for a constant infusion, a clear (transparent) bandage will be placed over the needle site. The bandage and needle will need to be changed every week, or as directed by your health care provider.  Keep the bandage covering the needle clean and dry. Do not get it wet. Follow your health care provider's instructions on how to take a shower or bath while the port is accessed.  If your port does not need to stay accessed, no bandage is needed over the port.  What is flushing? Flushing helps keep the port from getting clogged. Follow your health care provider's instructions on how and when to flush the port. Ports are usually flushed with saline solution or a medicine called heparin. The need for flushing will depend on how the port is used.  If the port is used for intermittent medicines or blood draws, the port will need to be flushed: ? After medicines have been given. ? After blood has been drawn. ? As part of routine maintenance.  If a constant infusion is running, the port may not need to be flushed.  How long will my port stay implanted? The port can stay in for as long as your health care provider thinks it is needed. When it is time for the port to come out, surgery will be   done to remove it. The procedure is similar to the one performed when the port was put in. When should I seek immediate medical care? When you have an implanted port, you should seek immediate medical care if:  You notice a bad smell coming from the incision site.  You have swelling, redness, or drainage at the incision site.  You have more swelling or pain at the port site or the surrounding area.  You have a fever that is not controlled with medicine.  This information is not intended to replace advice given to you by your health care provider. Make sure you discuss any questions you have with your health care provider. Document  Released: 02/08/2005 Document Revised: 07/17/2015 Document Reviewed: 10/16/2012 Elsevier Interactive Patient Education  2017 [employer]  

## 2017-03-15 NOTE — Progress Notes (Addendum)
[name]  [phone] [phone]  Clinic Follow up Note   Patient Care Team: Tower, [name], MD as PCP - General [name], MD as Consulting Physician (Hematology) [name], MD as Attending Physician (Gastroenterology) [name], MD as Consulting Physician (General Surgery) 03/15/2017  CHIEF COMPLAINT: Follow up colorectal adenocarcinoma   SUMMARY OF ONCOLOGIC HISTORY:   Cancer of overlapping sites of colon ([place])   08/31/2016 Tumor Marker    Patient's tumor was tested for the following markers: CEA. Results of the tumor marker test revealed 2.0.      08/31/2016 Pathology Results    Colon biopsy Diagnosis: 1. Surgical [P], hepatic flexure. ADENOCARCINOMA. Moderately differentiated. 2. Surgical [P], transverse. ADENOCARCINOMA, Moderately differentiated.      08/31/2016 Initial Diagnosis    Colon cancer metastasized to liver ([place])      08/31/2016 Procedure    - 1) Polypoid mass at the hepatic flexure, 2cm, soft but clearly neoplastic.  - 2) Clearly malignant mass in the distal transverse colon; 4cm, firm, 1/2 cirumference, ulcerated. ' - 3) Four small (3-23m) scattered typical appearing adenomas throughout the colon, notremoved since she mistakenly stayed on her plavix for this procedure.      09/01/2016 Imaging    CT Abdomen Pelvis W Contrast IMPRESSION: There are 2 colonic lesions involving the proximal ascending colon and proximal transverse colon, as detailed above, highly concerning for colonic neoplasm. In addition, there are at least 2 indeterminate liver lesions which are suspicious for potential metastatic disease.       09/16/2016 Imaging    Mr Liver W Wo Contrast IMPRESSION: Lesion within central right lobe of liver exhibits of peripheral enhancement and is suspicious for liver metastasis. Lesion within caudate lobe of liver identified on recent CT does not have a corresponding signal or enhancement abnormality on today's  study. Within the inferior right lobe of liver there is an enhancing structure which is favored to represent an atypical benign hemangioma with liver metastasis felt less likely. The transverse colon lesion is again identified compatible with colonic adenocarcinoma.      10/04/2016 PET scan    PET 10/04/16 IMPRESSION: 1. Highly hypermetabolic proximal transverse colon mass, maximum SUV 30.8. Small focus of hypermetabolic activity in the mesentery just above this mass probably represents a local involved lymph node. 2. Moderately hypermetabolic ascending colon mass, maximum SUV 8.7. 3. Highly hypermetabolic enlarged portacaval lymph node, maximum SUV 16.7. 4. Focus of hypermetabolic activity near the duodenum bulb could be physiologic or due to an adjacent lymph node. 5. None of the liver lesions are discernibly hypermetabolic. Particularly the more cephalad right hepatic lobe lesion merits surveillance, it had nonspecific enhancement characteristics on prior cross-sectional imaging and only a thin enhancing wall which could conceivably predispose to false negative due to central necrosis. 6. Several tiny chronic pulmonary nodules are likely benign. 7. Other imaging findings of potential clinical significance: Aortic Atherosclerosis (ICD10-I70.0). Coronary atherosclerosis. Bilateral nonobstructive nephrolithiasis.      11/10/2016 Surgery    LAPAROSCOPIC EXTENDED RIGHT COLECTOMY by Dr. [name]      11/10/2016 Pathology Results    Diagnosis 11/10/16 Colon, segmental resection for tumor, Ascending and Transverse ADENOCARCINOMA OF THE ASCENDING COLON WITH EXTRA CELLULAR MUCIN, GRADE 3, SIZE 3.4 CM THE TUMOR INVADES MUSCULARIS PROPRIA POORLY DIFFERENTIATED ADENOCARCINOMA OF THE TRANSVERSE COLON, GRADE 4, SIZE 6.1 CM THE TUMOR INVADES THROUGH MUSCULARIS PROPRIA INTO PERICOLONIC SOFT TISSUE EXTRAMURAL SATELLITE TUMOR NODULES IS IDENTIFIED (X1) ALL MARGINS OF RESECTION ARE  NEGATIVE FOR  CARCINOMA METASTATIC CARCINOMA IN ONE OF SIXTY LYMPH NODES (1/60) TABULAR ADENOMA X1      12/28/2016 Imaging    MRI abdomen w/wo contrast IMPRESSION: Motion degraded images.  Two indeterminate right liver lesions measuring up to 1.6 cm, as described above. Continued attention on follow-up is suggested.  Two periportal lymph nodes measuring up to 1.9 cm, suspicious for nodal metastases.  Postsurgical changes related to right colon resection.       12/29/2016 -  Chemotherapy    FOLFOX every 2 weeks starting 12/29/16       02/03/2017 Procedure    EUS Per [name] Endoscopic Finding Findings: The examined esophagus was endoscopically normal. The entire examined stomach was endoscopically normal. The examined duodenum was endoscopically normal. Endosonographic Finding (limited evaluation for tissue acquisition): 1. One enlarged lymph node (vs soft tissue mass) was visualized in the porta hepatis region. It measured 16 mm in maximal cross-sectional diameter. The node (vs soft tissue) was oval, isoechoic and had poorly defined margins. Fine needle aspiration for cytology was performed. Color Doppler imaging was utilized prior to needle puncture to confirm a lack of significant vascular structures within the needle path. Threepasses were made with the 25 gauge needle using a transgastric approach. 2. CBD was normal, non-dilated 3. Limited views of the liver, spleen, pancreas were all normal.  Impression: One periportal soft tissue mass (lymphnode?) measuring 51m was noted and sampled with FNA.      02/03/2017 Pathology Results    Diagnosis FINE NEEDLE ASPIRATION, ENDOSCOPIC, PERI PORTAL NODE (SPECIMEN 1 OF 1 COLLECTED 02/03/17): NO MALIGNANT CELLS IDENTIFIED. SCANT LYMPHOID AND BENIGN GLANDULAR ELEMENTS.      03/09/2017 Imaging    IMPRESSION: Interval decrease in several tiny low-attenuation liver lesions the, consistent with improving hepatic  metastases.  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      CURRENT THERAPY: FOLFOX q2 weeks starting 12/29/16  INTERVAL HISTORY: Ms. GStumporeturns for follow up as scheduled prior to next cycle FOLFOX. She was delayed 1 week due to thrombocytopenia. She experienced new abdominal and rib cage pain last week so she went to ED. She underwent diagnostic imaging but no cause was identified. Pain resolved and is not present today.  Intermittent diarrhea with formed BM 1 day ago; uses imodium PRN. No nausea, using compazine PRN for prevention. Takes pepcid once daily at night and is requesting refill. Good appetite, eating and drinking well. Has intermittent chills while on chemo, no fever. Denies cough, chest pain, dyspnea, lower extremity edema, mouth sores, numbness/tingling, or bleeding.   REVIEW OF SYSTEMS:   Constitutional: Denies fatigue, fevers or abnormal weight loss (+) intermittent chills  Eyes: Denies blurriness of vision Ears, nose, mouth, throat, and face: Denies mucositis or sore throat (+) ongoing sensation of fluid in her ears, improving Respiratory: Denies cough, dyspnea or wheezes Cardiovascular: Denies palpitation, chest discomfort (+) intermittent lower extremity swelling, left, after exercise  Gastrointestinal:  Denies nausea, vomiting, constipation, heartburn or change in bowel habits (+)intermittent diarrhea, imodium PRN (+) formed BM 1 day ago (+) transient upper and lower abdominal pain last week, resolved now Skin: Denies abnormal skin rashes Lymphatics: Denies new lymphadenopathy or easy bruising Neurological:Denies numbness, tingling, or new weaknesses  Behavioral/Psych: Mood is stable, no new changes (+) does not sleep well, improves with benadryl  All other systems were reviewed with the patient and are negative.  MEDICAL HISTORY:  Past Medical History:  Diagnosis Date  .  Anemia   . Arthritis   .  Astigmatism    both eyes  . Cancer [facility])    colon surgery done last chemo tx 01-17-17  . Colon cancer ([place])   . Coronary artery disease, non-occlusive   . Dyspnea    more so exertion  . Dysrhythmia    skipped beats every now and then  . GERD (gastroesophageal reflux disease)   . Glaucoma    both eyes right eye worse than left  . Hammer toes of both feet   . Headache    migraines with aura  . Heel spur    both heels and front  . HSV (herpes simplex virus) infection   . Hyperglycemia   . Hyperlipidemia   . IBS (irritable bowel syndrome)   . IC (interstitial cystitis)   . Insomnia disorder related to known organic factor   . Osteoporosis   . PONV (postoperative nausea and vomiting)    has had more surgeries, and had no problems  . Stroke ([place]) 2016  . Vitamin D deficiency     SURGICAL HISTORY: Past Surgical History:  Procedure Laterality Date  . ABDOMINAL HYSTERECTOMY     1 ovary and womb removed  . CARDIAC CATHETERIZATION  7/12   non obst dz -- not sure what yr  . CHOLECYSTECTOMY    . EUS N/A 02/03/2017   Procedure: UPPER ENDOSCOPIC ULTRASOUND (EUS) LINEAR;  Surgeon: [name], MD;  Location: [facility];  Service: Endoscopy;  Laterality: N/A;  . EYE SURGERY Bilateral    ioc for cataracts   . LAPAROSCOPIC RIGHT COLECTOMY Right 11/10/2016   Procedure: LAPAROSCOPIC EXTENDED RIGHT COLECTOMY;  Surgeon: [name], MD;  Location: [facility];  Service: General;  Laterality: Right;  . PORTACATH PLACEMENT Left 12/22/2016   Procedure: INSERTION PORT-A-CATH;  Surgeon: [name], MD;  Location: [facility];  Service: General;  Laterality: Left;  . ROTATOR CUFF REPAIR Bilateral     I have reviewed the social history and family history with the patient and they are unchanged from previous note.  ALLERGIES:  is allergic to salmon [fish allergy]; ampicillin; codeine; flagyl [metronidazole]; meperidine hcl; neomycin; penicillins; propoxyphene hcl; sulfa  antibiotics; and tramadol hcl.  MEDICATIONS:  Current Outpatient Medications  Medication Sig Dispense Refill  . apixaban (ELIQUIS) 5 MG TABS tablet Take 1 tablet (5 mg total) by mouth 2 (two) times daily. 60 tablet 2  . atorvastatin (LIPITOR) 40 MG tablet Take 1 tablet (40 mg total) by mouth daily at 6 PM. 90 tablet 3  . b complex vitamins tablet Take 1 tablet by mouth daily.    . bimatoprost (LUMIGAN) 0.01 % SOLN Place 1 drop into both eyes daily.    . Ca Carbonate-Mag Hydroxide (ROLAIDS PO) Take 1-2 tablets by mouth every 8 (eight) hours as needed (for acid reflux).     . Cholecalciferol (VITAMIN D) 2000 units tablet Take 2,000 Units by mouth daily.    . COMBIGAN 0.2-0.5 % ophthalmic solution     . famotidine (PEPCID) 20 MG tablet Take 1 tablet (20 mg total) by mouth at bedtime. 60 tablet 0  . gabapentin (NEURONTIN) 100 MG capsule Take 1 capsule (100 mg total) by mouth 2 (two) times daily as needed. (Patient taking differently: Take 100 mg by mouth 2 (two) times daily as needed (pain). ) 30 capsule 1  . Ibuprofen-Diphenhydramine Cit (IBUPROFEN PM) 200-38 MG TABS Take 1 tablet by mouth daily.    Marland Kitchen lactose free nutrition (BOOST)  LIQD Take 237 mLs by mouth daily.    Marland Kitchen lidocaine-prilocaine (EMLA) cream Apply to affected area once 30 g 3  . Multiple Vitamin (MULTIVITAMIN WITH MINERALS) TABS tablet Take 1 tablet by mouth daily.    . potassium chloride SA (K-DUR,KLOR-CON) 20 MEQ tablet Take 1 tablet (20 mEq total) by mouth daily. 30 tablet 1  . ciprofloxacin (CIPRO) 250 MG tablet Take 1 tablet (250 mg total) by mouth 2 (two) times daily. 10 tablet 0  . ondansetron (ZOFRAN) 8 MG tablet Take 1 tablet (8 mg total) by mouth 2 (two) times daily as needed for refractory nausea / vomiting. Start on day 3 after chemotherapy. (Patient not taking: Reported on 03/15/2017) 30 tablet 1  . polyethylene glycol (MIRALAX / GLYCOLAX) packet Take 17 g by mouth daily. (Patient not taking: Reported on 03/15/2017) 14 each 0   . prochlorperazine (COMPAZINE) 10 MG tablet Take 1 tablet (10 mg total) by mouth every 6 (six) hours as needed (Nausea or vomiting). (Patient not taking: Reported on 03/15/2017) 30 tablet 1   No current facility-administered medications for this visit.    Facility-Administered Medications Ordered in Other Visits  Medication Dose Route Frequency Provider Last Rate Last Dose  . fluorouracil (ADRUCIL) 4,150 mg in sodium chloride 0.9 % 67 mL chemo infusion  2,400 mg/m2 (Treatment Plan Recorded) Intravenous 1 day or 1 dose [name], MD      . leucovorin 688 mg in dextrose 5 % 250 mL infusion  400 mg/m2 (Treatment Plan Recorded) Intravenous Once [name], MD 142 mL/hr at 03/15/17 1304 688 mg at 03/15/17 1304  . oxaliplatin (ELOXATIN) 145 mg in dextrose 5 % 500 mL chemo infusion  85 mg/m2 (Treatment Plan Recorded) Intravenous Once [name], MD 265 mL/hr at 03/15/17 1302 145 mg at 03/15/17 1302    PHYSICAL EXAMINATION: ECOG PERFORMANCE STATUS: 1 - Symptomatic but completely ambulatory  Vitals:   03/15/17 1015  BP: (!) 147/76  Pulse: 79  Resp: 20  Temp: 97.9 F (36.6 C)  SpO2: 99%  BP 138/77 on repeat Filed Weights   03/15/17 1015  Weight: 151 lb 6.4 oz (68.7 kg)    GENERAL:alert, no distress and comfortable SKIN: skin color, texture, turgor are normal, no rashes or significant lesions EYES: normal, Conjunctiva are pink and non-injected, sclera clear OROPHARYNX:no exudate, no erythema and lips, buccal mucosa, and tongue normal  NECK: supple, thyroid normal size, non-tender, without nodularity LYMPH:  no palpable cervical, supraclavicular, axillary, or inguinal lymphadenopathy LUNGS: clear to auscultation bilaterally with normal breathing effort HEART: regular rate & rhythm and no murmurs and no lower extremity edema ABDOMEN:abdomen soft, non-tender and normal bowel sounds. No hepatomegaly Musculoskeletal:no cyanosis of digits and no clubbing  NEURO: alert & oriented x 3 with fluent  speech, no focal motor/sensory deficits PAC without erythema  LABORATORY DATA:  I have reviewed the data as listed CBC Latest Ref Rng & Units 03/15/2017 03/09/2017 03/07/2017  WBC 3.9 - 10.3 K/uL 3.9 4.6 3.9  Hemoglobin 11.6 - 15.9 g/dL 11.6 11.6(L) 11.6  Hematocrit 34.8 - 46.6 % 35.5 35.0(L) 34.8  Platelets 145 - 400 K/uL 146 91(L) 75(L)     CMP Latest Ref Rng & Units 03/15/2017 03/09/2017 03/07/2017  Glucose 70 - 140 mg/dL 96 92 132  BUN 7 - 26 mg/dL _0 Creatinine 0.60 - 1.10 mg/dL 0.88 0.64 0.81  Sodium 136 - 145 mmol/L 140 137 142  Potassium 3.3 - 4.7 mmol/L 3.8 3.5 3.2(L)  Chloride 98 -  109 mmol/L 107 104 107  CO2 22 - 29 mmol/L _0 Calcium 8.4 - 10.4 mg/dL 9.5 9.0 9.5  Total Protein 6.4 - 8.3 g/dL 6.7 7.0 6.7  Total Bilirubin 0.2 - 1.2 mg/dL 0.3 0.4 0.3  Alkaline Phos 40 - 150 U/L 93 82 78  AST 5 - 34 U/L 32 31 29  ALT 0 - 55 U/L 33 30 29   CEA 08/31/16: 2.0   PATHOLOGY REPORT:    Diagnosis 11/10/16 Colon, segmental resection for tumor, Ascending and Transverse ADENOCARCINOMA OF THE ASCENDING COLON WITH EXTRA CELLULAR MUCIN, GRADE 3, SIZE 3.4 CM THE TUMOR INVADES MUSCULARIS PROPRIA POORLY DIFFERENTIATED ADENOCARCINOMA OF THE TRANSVERSE COLON, GRADE 4, SIZE 6.1 CM THE TUMOR INVADES THROUGH MUSCULARIS PROPRIA INTO PERICOLONIC SOFT TISSUE EXTRAMURAL SATELLITE TUMOR NODULES IS IDENTIFIED (X1) ALL MARGINS OF RESECTION ARE NEGATIVE FOR CARCINOMA METASTATIC CARCINOMA IN ONE OF SIXTY LYMPH NODES (1/60) TABULAR ADENOMA X1 Microscopic Comment COLON AND RECTUM (INCLUDING TRANS-ANAL RESECTION): Specimen: Right and transverse colon Procedure: Segmental resection Tumor site: Ascending and transverse colon Specimen integrity: Intact Macroscopic intactness of mesorectum: Not applicable: X Complete: NA Near complete: NA Incomplete: NA Cannot be determined (specify): NA Macroscopic tumor perforation: Pericolonic soft tissue Invasive tumor: Maximum size: 6.1 cm  and 3.4 cm Histologic type(s): Adenocarcinoma Histologic grade and differentiation: G1: well differentiated/low grade Microscopic Comment(continued) G2: moderately differentiated/low grade G3: poorly differentiated/high grade G4: undifferentiated/high grade Type of polyp in which invasive carcinoma arose: Tubular adenoma Microscopic extension of invasive tumor: Pericolonic soft tissue Lymph-Vascular invasion: Identified Peri-neural invasion: Negative Tumor deposit(s) (discontinuous extramural extension): Present Resection margins: Proximal margin: Negative Distal margin: Negative Circumferential (radial) (posterior ascending, posterior descending; lateral and posterior mid-rectum; and entire lower 1/3 rectum): Negative Mesenteric margin (sigmoid and transverse): Negative Distance closest margin (if all above margins negative): 1.5 cm from the mesenteric margin Trans-anal resection margins only: Deep margin: NA Mucosal Margin: NA Distance closest mucosal margin (if negative): NA Treatment effect (neo-adjuvant therapy): Negative Additional polyp(s): Tubular adenoma Non-neoplastic findings: Unremarkble Lymph nodes: number examined 60; number positive: 1 Pathologic Staging: pT3 (m), pN1c, pMx Ancillary studies: Ordered    Colon biopsy, 08/31/16 Diagnosis 1. Surgical [P], hepatic flexure - ADENOCARCINOMA. Moderately differentiated.  2. Surgical [P], transverse - ADENOCARCINOMA, Moderately differentiated.    RADIOGRAPHIC STUDIES: I have personally reviewed the radiological images as listed and agreed with the findings in the report. No results found.   ASSESSMENT & PLAN: [name], is a14 y.o.caucasian femalewith colorectal adenocarcinoma, moderately differentiated, found on screening colonoscopy.   1. Synchronized colon adenocarcinoma, moderately differentiated, in hepatic flexure and distal transverse colon, pT3(m)N1cMx,at leaststage IIIB, MSI-H 2.  Genetics  3. Anemia, iron deficient  4. Liver lesions, possible metastasis  5. History of stroke 6. ? HTN 7. Sinus drainage; ear fullness; positional dizziness/vertigo    8. Paresthesias - fingers and feet  9. UTI-like sx 10. Thrombocytopenia 11.  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  Atrial fibrillation, on Eliqus  Ms. Daughtrey appears stable today. She completed 4 cycles adjuvant FOLFOX, tolerating well overall. Intermittent diarrhea is controlled with imodium. VS and weight are stable. Labs reviewed, CBC and Cmet are unremarkable. Thrombocytopenia resolved today, plt 146K.  Hypokalemia resolved but I recommend she continue oral K supplement due to intermittent diarrhea. Proceed with cycle 5 FOLFOX with subsequent dose reduction. Return in 2 weeks for f/u and cycle 6.   Work up in ED for abdominal and rib cage pain was negative for infection, PE,  or other acute process. Indeterminate liver lesions on MR Abdomen in 12/2016 appear decreased on CT AP on 03/09/17. Lesions have not been biopsied. No pathologically enlarged LNs. Incidentally noted to have new nonocclusive thrombus within SMV and proximal portal veins, which may account for new pain. She will increase eliquis to 10 mg BID x7 days then back to 5 mg BID thereafter. I reviewed her scan with Dr. Burr Medico who discussed findings and recommendations with the patient.   PLAN: -Labs reviewed, proceed with cycle 5 FOLFOX today; subsequent dose reduction -Return in 2 weeks for f/u and cycle 6 -Refilled Pepcid  All questions were answered. The patient knows to call the clinic with any problems, questions or concerns. No barriers to learning was detected.     Alla Feeling, NP 03/15/17   Addendum  I have seen the patient, examined her. I agree with the assessment and and plan and have edited the notes.   Ms Pearletha Alfred had an episode of upper abdominal pain recently, possible related to her new portal vein thrombosis.  She was off  anticoagulation and started about a week ago.  I recommended her to increased Eliquis dose to 10 mg twice daily for 7 days as loading dose, then change back to 5 mg twice daily as maintenance.   I have reviewed her recent CT scan images in person, and discussed the finding with patient.  Although her multiple liver lesions were felt to be indeterminate based on previous CT, MRI and PET scan however, 2 lesions have decreased in size recently after chemotherapy, concerning for metastatic disease.  Her previous enlarged portal node was biopsied and negative for metastasis, however it has shrunk also after chemotherapy.  I recommend her to continue chemotherapy, however she has developed more fatigue, cold sensitivity, and wishes to de-escalate her chemo.  Due to her moderate thrombocytopenia after last cycle chemotherapy, I will decrease her 5-FU dose by 10% today, and plan to reduce oxaliplatin and 5-FU dose by 10% also from next cycle.  If she continues not tolerating well, will change it to Xeloda (pt refers pills) in a few months, although I will encourage her to stay on FOLFOX for as long as she can, hopefully at least 4-6 months.   [name]  03/15/2017

## 2017-03-15 NOTE — Progress Notes (Signed)
Per pharmacy patient is to come in after forty hours of 5FU home infusion. Patient is aware. Give date/time. Sent message to scheduling. Patient is to come in Thursday 01/24 at 10 am for pump d/c after 40 hours per Arbie Cookey in pharmacy due to a dose reduction.  Cyndia Bent RN

## 2017-03-17 ENCOUNTER — Inpatient Hospital Stay: Payer: Medicare HMO

## 2017-03-17 VITALS — BP 142/78 | HR 74 | Temp 98.8°F | Resp 16

## 2017-03-17 DIAGNOSIS — Z5111 Encounter for antineoplastic chemotherapy: Secondary | ICD-10-CM | POA: Diagnosis not present

## 2017-03-17 DIAGNOSIS — Z8673 Personal history of transient ischemic attack (TIA), and cerebral infarction without residual deficits: Secondary | ICD-10-CM | POA: Diagnosis not present

## 2017-03-17 DIAGNOSIS — C188 Malignant neoplasm of overlapping sites of colon: Secondary | ICD-10-CM | POA: Diagnosis not present

## 2017-03-17 DIAGNOSIS — E876 Hypokalemia: Secondary | ICD-10-CM | POA: Diagnosis not present

## 2017-03-17 DIAGNOSIS — D509 Iron deficiency anemia, unspecified: Secondary | ICD-10-CM | POA: Diagnosis not present

## 2017-03-17 DIAGNOSIS — C787 Secondary malignant neoplasm of liver and intrahepatic bile duct: Secondary | ICD-10-CM | POA: Diagnosis not present

## 2017-03-17 DIAGNOSIS — R300 Dysuria: Secondary | ICD-10-CM | POA: Diagnosis not present

## 2017-03-17 MED ORDER — HEPARIN SOD (PORK) LOCK FLUSH 100 UNIT/ML IV SOLN
500.0000 [IU] | Freq: Once | INTRAVENOUS | Status: AC | PRN
  Administered 2017-03-17: 500 [IU]
  Filled 2017-03-17: qty 5

## 2017-03-17 MED ORDER — SODIUM CHLORIDE 0.9% FLUSH
10.0000 mL | INTRAVENOUS | Status: DC | PRN
  Administered 2017-03-17: 10 mL
  Filled 2017-03-17: qty 10

## 2017-03-21 ENCOUNTER — Ambulatory Visit: Payer: Medicare HMO

## 2017-03-21 ENCOUNTER — Other Ambulatory Visit: Payer: Medicare HMO

## 2017-03-21 ENCOUNTER — Ambulatory Visit: Payer: Medicare HMO | Admitting: Hematology

## 2017-03-27 NOTE — Progress Notes (Signed)
[place]  Telephone:([phone]) [phone] Fax:(336) [phone]  Clinic Follow up Note   Patient Care Team: Tower, [name], MD as PCP - General [name], MD as Consulting Physician (Hematology) [name], MD as Attending Physician (Gastroenterology) [name], MD as Consulting Physician (General Surgery) 03/28/2017  CHIEF COMPLAINT: Follow up colorectal adenocarcinoma  SUMMARY OF ONCOLOGIC HISTORY:  Cancer Staging Cancer of overlapping sites of colon [facility]) Staging form: Colon and Rectum, AJCC 8th Edition - Pathologic stage from 11/10/2016: Stage IIIB (pT3(m), pN1c, cM0) - Signed by [name], MD on 12/16/2016    Cancer of overlapping sites of colon ([place])   08/31/2016 Tumor Marker    Patient's tumor was tested for the following markers: CEA. Results of the tumor marker test revealed 2.0.      08/31/2016 Pathology Results    Colon biopsy Diagnosis: 1. Surgical [P], hepatic flexure. ADENOCARCINOMA. Moderately differentiated. 2. Surgical [P], transverse. ADENOCARCINOMA, Moderately differentiated.      08/31/2016 Initial Diagnosis    Colon cancer metastasized to liver ([place])      08/31/2016 Procedure    - 1) Polypoid mass at the hepatic flexure, 2cm, soft but clearly neoplastic.  - 2) Clearly malignant mass in the distal transverse colon; 4cm, firm, 1/2 cirumference, ulcerated. ' - 3) Four small (3-106m) scattered typical appearing adenomas throughout the colon, notremoved since she mistakenly stayed on her plavix for this procedure.      09/01/2016 Imaging    CT Abdomen Pelvis W Contrast IMPRESSION: There are 2 colonic lesions involving the proximal ascending colon and proximal transverse colon, as detailed above, highly concerning for colonic neoplasm. In addition, there are at least 2 indeterminate liver lesions which are suspicious for potential metastatic disease.       09/16/2016 Imaging    Mr Liver W Wo Contrast IMPRESSION: Lesion within central right lobe  of liver exhibits of peripheral enhancement and is suspicious for liver metastasis. Lesion within caudate lobe of liver identified on recent CT does not have a corresponding signal or enhancement abnormality on today's study. Within the inferior right lobe of liver there is an enhancing structure which is favored to represent an atypical benign hemangioma with liver metastasis felt less likely. The transverse colon lesion is again identified compatible with colonic adenocarcinoma.      10/04/2016 PET scan    PET 10/04/16 IMPRESSION: 1. Highly hypermetabolic proximal transverse colon mass, maximum SUV 30.8. Small focus of hypermetabolic activity in the mesentery just above this mass probably represents a local involved lymph node. 2. Moderately hypermetabolic ascending colon mass, maximum SUV 8.7. 3. Highly hypermetabolic enlarged portacaval lymph node, maximum SUV 16.7. 4. Focus of hypermetabolic activity near the duodenum bulb could be physiologic or due to an adjacent lymph node. 5. None of the liver lesions are discernibly hypermetabolic. Particularly the more cephalad right hepatic lobe lesion merits surveillance, it had nonspecific enhancement characteristics on prior cross-sectional imaging and only a thin enhancing wall which could conceivably predispose to false negative due to central necrosis. 6. Several tiny chronic pulmonary nodules are likely benign. 7. Other imaging findings of potential clinical significance: Aortic Atherosclerosis (ICD10-I70.0). Coronary atherosclerosis. Bilateral nonobstructive nephrolithiasis.      11/10/2016 Surgery    LAPAROSCOPIC EXTENDED RIGHT COLECTOMY by Dr. [name]      11/10/2016 Pathology Results    Diagnosis 11/10/16 Colon, segmental resection for tumor, Ascending and Transverse ADENOCARCINOMA OF THE ASCENDING COLON WITH EXTRA CELLULAR MUCIN, GRADE 3, SIZE 3.4 CM THE TUMOR INVADES  MUSCULARIS PROPRIA POORLY DIFFERENTIATED ADENOCARCINOMA OF THE  TRANSVERSE COLON, GRADE 4, SIZE 6.1 CM THE TUMOR INVADES THROUGH MUSCULARIS PROPRIA INTO PERICOLONIC SOFT TISSUE EXTRAMURAL SATELLITE TUMOR NODULES IS IDENTIFIED (X1) ALL MARGINS OF RESECTION ARE NEGATIVE FOR CARCINOMA METASTATIC CARCINOMA IN ONE OF SIXTY LYMPH NODES (1/60) TABULAR ADENOMA X1      12/28/2016 Imaging    MRI abdomen w/wo contrast IMPRESSION: Motion degraded images.  Two indeterminate right liver lesions measuring up to 1.6 cm, as described above. Continued attention on follow-up is suggested.  Two periportal lymph nodes measuring up to 1.9 cm, suspicious for nodal metastases.  Postsurgical changes related to right colon resection.       12/29/2016 -  Chemotherapy    FOLFOX every 2 weeks starting 12/29/16       02/03/2017 Procedure    EUS Per [name] Endoscopic Finding Findings: The examined esophagus was endoscopically normal. The entire examined stomach was endoscopically normal. The examined duodenum was endoscopically normal. Endosonographic Finding (limited evaluation for tissue acquisition): 1. One enlarged lymph node (vs soft tissue mass) was visualized in the porta hepatis region. It measured 16 mm in maximal cross-sectional diameter. The node (vs soft tissue) was oval, isoechoic and had poorly defined margins. Fine needle aspiration for cytology was performed. Color Doppler imaging was utilized prior to needle puncture to confirm a lack of significant vascular structures within the needle path. Threepasses were made with the 25 gauge needle using a transgastric approach. 2. CBD was normal, non-dilated 3. Limited views of the liver, spleen, pancreas were all normal.  Impression: One periportal soft tissue mass (lymphnode?) measuring 71m was noted and sampled with FNA.      02/03/2017 Pathology Results    Diagnosis FINE NEEDLE ASPIRATION, ENDOSCOPIC, PERI PORTAL NODE (SPECIMEN 1 OF 1 COLLECTED 02/03/17): NO MALIGNANT CELLS IDENTIFIED. SCANT  LYMPHOID AND BENIGN GLANDULAR ELEMENTS.      03/09/2017 Imaging    IMPRESSION: Interval decrease in several tiny low-attenuation liver lesions the, consistent with improving hepatic metastases.  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      CURRENT THERAPY: FOLFOX q2 weeks starting 12/29/16  INTERVAL HISTORY: Ms. GBiondoreturns for follow up as scheduled prior to next cycle FOLFOX, last treated 03/15/17. She has mild intermittent chills without fever while on chemo. Developed small mouth sore on inside of lower lip, not hindering po intake. She has not used mouth wash. No nausea, vomiting, or abdominal pain. Po intake was low last week with 2-3 episodes of diarrhea x5 days. On the 3rd day she took imodium "a couple of times" without much improvement. She read on the label that she had to take pills on empty stomach so this made it difficult for her to take. No blood in stool. BM is formed now. Continues to have cold sensitivity, improves with warming her hands. No numbness or tingling. She completed increased dose of Eliquis and is back to taking 1 tab BID.   REVIEW OF SYSTEMS:   Constitutional: Denies fever (+) abnormal weight loss (+) intermittent mild chills (+) decreased po intake Eyes: Denies blurriness of vision Ears, nose, mouth, throat, and face: Denies sore throat (+) mucositis   Respiratory: Denies cough, dyspnea or wheezes Cardiovascular: Denies palpitation, chest discomfort or lower extremity swelling Gastrointestinal:  Denies nausea, vomiting, constipation, heartburn, abdominal pain, or blood in stool (+) 2-3 episodes diarrhea x5 days last week; BM formed now  Skin: Denies abnormal skin rashes Lymphatics: Denies new  lymphadenopathy or easy bruising Neurological:Denies numbness, tingling or new weaknesses (+) persistent cold sensitivity, improves with warm temperatures  Behavioral/Psych: Mood is stable, no new  changes  All other systems were reviewed with the patient and are negative.  MEDICAL HISTORY:  Past Medical History:  Diagnosis Date  . Anemia   . Arthritis   . Astigmatism    both eyes  . Cancer [facility])    colon surgery done last chemo tx 01-17-17  . Colon cancer ([place])   . Coronary artery disease, non-occlusive   . Dyspnea    more so exertion  . Dysrhythmia    skipped beats every now and then  . GERD (gastroesophageal reflux disease)   . Glaucoma    both eyes right eye worse than left  . Hammer toes of both feet   . Headache    migraines with aura  . Heel spur    both heels and front  . HSV (herpes simplex virus) infection   . Hyperglycemia   . Hyperlipidemia   . IBS (irritable bowel syndrome)   . IC (interstitial cystitis)   . Insomnia disorder related to known organic factor   . Osteoporosis   . PONV (postoperative nausea and vomiting)    has had more surgeries, and had no problems  . Stroke ([place]) 2016  . Vitamin D deficiency     SURGICAL HISTORY: Past Surgical History:  Procedure Laterality Date  . ABDOMINAL HYSTERECTOMY     1 ovary and womb removed  . CARDIAC CATHETERIZATION  7/12   non obst dz -- not sure what yr  . CHOLECYSTECTOMY    . EUS N/A 02/03/2017   Procedure: UPPER ENDOSCOPIC ULTRASOUND (EUS) LINEAR;  Surgeon: [name], MD;  Location: [facility];  Service: Endoscopy;  Laterality: N/A;  . EYE SURGERY Bilateral    ioc for cataracts   . LAPAROSCOPIC RIGHT COLECTOMY Right 11/10/2016   Procedure: LAPAROSCOPIC EXTENDED RIGHT COLECTOMY;  Surgeon: [name], MD;  Location: [place];  Service: General;  Laterality: Right;  . PORTACATH PLACEMENT Left 12/22/2016   Procedure: INSERTION PORT-A-CATH;  Surgeon: [name], MD;  Location: [place];  Service: General;  Laterality: Left;  . ROTATOR CUFF REPAIR Bilateral     I have reviewed the social history and family history with the patient and they are unchanged from previous  note.  ALLERGIES:  is allergic to salmon [fish allergy]; ampicillin; codeine; flagyl [metronidazole]; meperidine hcl; neomycin; penicillins; propoxyphene hcl; sulfa antibiotics; and tramadol hcl.  MEDICATIONS:  Current Outpatient Medications  Medication Sig Dispense Refill  . apixaban (ELIQUIS) 5 MG TABS tablet Take 1 tablet (5 mg total) by mouth 2 (two) times daily. 60 tablet 2  . atorvastatin (LIPITOR) 40 MG tablet Take 1 tablet (40 mg total) by mouth daily at 6 PM. 90 tablet 3  . b complex vitamins tablet Take 1 tablet by mouth daily.    . bimatoprost (LUMIGAN) 0.01 % SOLN Place 1 drop into both eyes daily.    . Ca Carbonate-Mag Hydroxide (ROLAIDS PO) Take 1-2 tablets by mouth every 8 (eight) hours as needed (for acid reflux).     . Cholecalciferol (VITAMIN D) 2000 units tablet Take 2,000 Units by mouth daily.    . COMBIGAN 0.2-0.5 % ophthalmic solution     . famotidine (PEPCID) 20 MG tablet Take 1 tablet (20 mg total) by mouth at bedtime. 60 tablet 0  . Ibuprofen-Diphenhydramine Cit (IBUPROFEN PM) 200-38 MG TABS Take 1 tablet by  mouth daily.    Marland Kitchen lactose free nutrition (BOOST) LIQD Take 237 mLs by mouth daily.    Marland Kitchen lidocaine-prilocaine (EMLA) cream Apply to affected area once 30 g 3  . Multiple Vitamin (MULTIVITAMIN WITH MINERALS) TABS tablet Take 1 tablet by mouth daily.    . polyethylene glycol (MIRALAX / GLYCOLAX) packet Take 17 g by mouth daily. 14 each 0  . potassium chloride SA (K-DUR,KLOR-CON) 20 MEQ tablet Take 1 tablet (20 mEq total) by mouth daily. 30 tablet 1  . gabapentin (NEURONTIN) 100 MG capsule Take 1 capsule (100 mg total) by mouth 2 (two) times daily as needed. (Patient taking differently: Take 100 mg by mouth 2 (two) times daily as needed (pain). ) 30 capsule 1  . ondansetron (ZOFRAN) 8 MG tablet Take 1 tablet (8 mg total) by mouth 2 (two) times daily as needed for refractory nausea / vomiting. Start on day 3 after chemotherapy. 30 tablet 1  . prochlorperazine  (COMPAZINE) 10 MG tablet Take 1 tablet (10 mg total) by mouth every 6 (six) hours as needed (Nausea or vomiting). 30 tablet 1   No current facility-administered medications for this visit.    Facility-Administered Medications Ordered in Other Visits  Medication Dose Route Frequency Provider Last Rate Last Dose  . fluorouracil (ADRUCIL) 3,800 mg in sodium chloride 0.9 % 74 mL chemo infusion  2,200 mg/m2 (Treatment Plan Recorded) Intravenous 1 day or 1 dose [name], MD   3,800 mg at 03/28/17 1442  . heparin lock flush 100 unit/mL  500 Units Intracatheter Once PRN [name], MD      . sodium chloride flush (NS) 0.9 % injection 10 mL  10 mL Intracatheter PRN [name], MD        PHYSICAL EXAMINATION: ECOG PERFORMANCE STATUS: 2 - Symptomatic, <50% confined to bed  Vitals:   03/28/17 1023  BP: (!) 160/84  Pulse: 70  Resp: 20  Temp: (!) 97.5 F (36.4 C)  SpO2: 99%   Filed Weights   03/28/17 1023  Weight: 147 lb 8 oz (66.9 kg)    GENERAL:alert, no distress and comfortable SKIN: skin color, texture, turgor are normal, no rashes or significant lesions EYES: normal, Conjunctiva are pink and non-injected, sclera clear OROPHARYNX: (+) small erythematous ulcer to inside lower lip  NECK: supple, thyroid normal size, non-tender, without nodularity LYMPH:  no palpable cervical, supraclavicular, axillary, or inguinal lymphadenopathy LUNGS: clear to auscultation bilaterally with normal breathing effort HEART: regular rate & rhythm and no murmurs and no lower extremity edema ABDOMEN:abdomen soft, non-tender and normal bowel sounds. No hepatomegaly  Musculoskeletal:no cyanosis of digits and no clubbing  NEURO: alert & oriented x 3 with fluent speech, no focal motor/sensory deficits PAC without erythema   LABORATORY DATA:  I have reviewed the data as listed CBC Latest Ref Rng & Units 03/28/2017 03/15/2017 03/09/2017  WBC 3.9 - 10.3 K/uL 3.5(L) 3.9 4.6  Hemoglobin 11.6 - 15.9 g/dL - 11.6 11.6(L)    Hematocrit 34.8 - 46.6 % 34.8 35.5 35.0(L)  Platelets 145 - 400 K/uL 121(L) 146 91(L)     CMP Latest Ref Rng & Units 03/28/2017 03/15/2017 03/09/2017  Glucose 70 - 140 mg/dL 110 96 92  BUN 7 - 26 mg/dL '18 13 13  ' Creatinine 0.60 - 1.10 mg/dL 0.82 0.88 0.64  Sodium 136 - 145 mmol/L 143 140 137  Potassium 3.5 - 5.1 mmol/L 3.3(L) 3.8 3.5  Chloride 98 - 109 mmol/L 108 107 104  CO2 22 - 29 mmol/L  '25 25 25  ' Calcium 8.4 - 10.4 mg/dL 9.4 9.5 9.0  Total Protein 6.4 - 8.3 g/dL 6.6 6.7 7.0  Total Bilirubin 0.2 - 1.2 mg/dL 0.3 0.3 0.4  Alkaline Phos 40 - 150 U/L 95 93 82  AST 5 - 34 U/L 32 32 31  ALT 0 - 55 U/L 27 33 30  CEA 08/31/16: 2.0   PATHOLOGY REPORT:    Diagnosis 11/10/16 Colon, segmental resection for tumor, Ascending and Transverse ADENOCARCINOMA OF THE ASCENDING COLON WITH EXTRA CELLULAR MUCIN, GRADE 3, SIZE 3.4 CM THE TUMOR INVADES MUSCULARIS PROPRIA POORLY DIFFERENTIATED ADENOCARCINOMA OF THE TRANSVERSE COLON, GRADE 4, SIZE 6.1 CM THE TUMOR INVADES THROUGH MUSCULARIS PROPRIA INTO PERICOLONIC SOFT TISSUE EXTRAMURAL SATELLITE TUMOR NODULES IS IDENTIFIED (X1) ALL MARGINS OF RESECTION ARE NEGATIVE FOR CARCINOMA METASTATIC CARCINOMA IN ONE OF SIXTY LYMPH NODES (1/60) TABULAR ADENOMA X1 Microscopic Comment COLON AND RECTUM (INCLUDING TRANS-ANAL RESECTION): Specimen: Right and transverse colon Procedure: Segmental resection Tumor site: Ascending and transverse colon Specimen integrity: Intact Macroscopic intactness of mesorectum: Not applicable: X Complete: NA Near complete: NA Incomplete: NA Cannot be determined (specify): NA Macroscopic tumor perforation: Pericolonic soft tissue Invasive tumor: Maximum size: 6.1 cm and 3.4 cm Histologic type(s): Adenocarcinoma Histologic grade and differentiation: G1: well differentiated/low grade Microscopic Comment(continued) G2: moderately differentiated/low grade G3: poorly differentiated/high grade G4: undifferentiated/high  grade Type of polyp in which invasive carcinoma arose: Tubular adenoma Microscopic extension of invasive tumor: Pericolonic soft tissue Lymph-Vascular invasion: Identified Peri-neural invasion: Negative Tumor deposit(s) (discontinuous extramural extension): Present Resection margins: Proximal margin: Negative Distal margin: Negative Circumferential (radial) (posterior ascending, posterior descending; lateral and posterior mid-rectum; and entire lower 1/3 rectum): Negative Mesenteric margin (sigmoid and transverse): Negative Distance closest margin (if all above margins negative): 1.5 cm from the mesenteric margin Trans-anal resection margins only: Deep margin: NA Mucosal Margin: NA Distance closest mucosal margin (if negative): NA Treatment effect (neo-adjuvant therapy): Negative Additional polyp(s): Tubular adenoma Non-neoplastic findings: Unremarkble Lymph nodes: number examined 60; number positive: 1 Pathologic Staging: pT3 (m), pN1c, pMx Ancillary studies: Ordered    Colon biopsy, 08/31/16 Diagnosis 1. Surgical [P], hepatic flexure - ADENOCARCINOMA. Moderately differentiated.  2. Surgical [P], transverse - ADENOCARCINOMA, Moderately differentiated.    RADIOGRAPHIC STUDIES: I have personally reviewed the radiological images as listed and agreed with the findings in the report. No results found.   ASSESSMENT & PLAN: [name], is a34 y.o.caucasian femalewith colorectal adenocarcinoma, moderately differentiated, found on screening colonoscopy.   1. Synchronized colon adenocarcinoma, moderately differentiated, in hepatic flexure and distal transverse colon, pT3(m)N1cMx,at leaststage IIIB, MSI-H -Ms. Jahr appears stable today, she has completed 5 cycles adjuvant FOLFOX chemotherapy without dosage adjustmant -she developed diarrhea, mucositis not limiting po intake, and mild thrombocytopenia, persistent cold sensitivity with cycle 5 -I suggest  maximizing imodium for diarrhea and warm salt water with baking soda rinse for mucositis -Labs reviewed, proceed with cycle 6 today and continue q2 weeks -will dose-reduce oxaliplatin to 35m/m2 and 5FU to 2200 mg/m2 today with cycle 6 -Return in 2 weeks for f/u with Dr. FBurr Medicoand next cycle; however, patient interested in de-escalating chemo  2. 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 Anemia, iron deficient  -s/p IV Feraheme 11/26, 12/17 -Anemia resolved, iron studies adequate -will continue monitoring   4. Liver lesions, possible metastasis  -liver lesions previously indeterminate but noted to be decreased on last imaging, concerning for  metastasis -Previously enlarged portal node was negative for metastasis, but also appears to have shrunk while on chemotherapy -Dr. Burr Medico recommended to continue chemotherapy, but the patient does not wish to schedule more chemo after next cycle, she will come for f/u on 2/18 to discuss options with Dr. Burr Medico   5. History of stroke -on lipitor   6. ? HTN -BP has been intermittently elevated, no previous diagnosis of HTN -160/84 today, patient ambulating prior to BP check -will monitor closely for now  7. Sinus drainage; ear fullness; positional dizziness/vertigo  -stable overall  8. Paresthesias - fingers and feet  -Denies numbness or tingling, but cold sensitivity has increased, lasting 2 weeks between chemo cycles -function and dexterity remain intact  9. UTI-like sx -resolved  10. 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  Atrial fibrillation, on Eliqus -Normal rhythm and rate today, will monitor  13. Diarrhea, hypokalemia -she had 5 days of diarrhea last week, 2-3 episodes per day; she used imodium infrequently -she has mild hypokalemia, K 3.3 -I suggest she continue oral K supplement and increase K in her diet -maximize imodium for relief; we reviewed dosing instructions  PLAN: -Labs reviewed, proceed with cycle 6 FOLFOX with oxaliplatin and 5FU dose reductions -Return in 2 weeks for lab, f/u with Dr. FBurr Medico and next cycle  -Maximize imodium for diarrhea, continue oral K supplement -Warm salt water and baking soda rinse for mucositis  All questions were answered. The patient knows to call the clinic with any problems, questions or concerns. No barriers to learning was detected. I spent 20 minutes counseling the patient face to face. The total time spent in the appointment was 25 minutes and more than 50% was on counseling and review of test results     LAlla Feeling NP 03/28/17

## 2017-03-28 ENCOUNTER — Inpatient Hospital Stay: Payer: Medicare HMO | Attending: Nurse Practitioner | Admitting: Nurse Practitioner

## 2017-03-28 ENCOUNTER — Inpatient Hospital Stay: Payer: Medicare HMO

## 2017-03-28 ENCOUNTER — Encounter: Payer: Self-pay | Admitting: Nurse Practitioner

## 2017-03-28 VITALS — BP 160/84 | HR 70 | Temp 97.5°F | Resp 20 | Ht 64.0 in | Wt 147.5 lb

## 2017-03-28 DIAGNOSIS — Z5111 Encounter for antineoplastic chemotherapy: Secondary | ICD-10-CM | POA: Diagnosis present

## 2017-03-28 DIAGNOSIS — E876 Hypokalemia: Secondary | ICD-10-CM | POA: Diagnosis not present

## 2017-03-28 DIAGNOSIS — D696 Thrombocytopenia, unspecified: Secondary | ICD-10-CM

## 2017-03-28 DIAGNOSIS — C188 Malignant neoplasm of overlapping sites of colon: Secondary | ICD-10-CM | POA: Insufficient documentation

## 2017-03-28 DIAGNOSIS — C189 Malignant neoplasm of colon, unspecified: Secondary | ICD-10-CM

## 2017-03-28 DIAGNOSIS — D508 Other iron deficiency anemias: Secondary | ICD-10-CM

## 2017-03-28 DIAGNOSIS — C787 Secondary malignant neoplasm of liver and intrahepatic bile duct: Principal | ICD-10-CM

## 2017-03-28 DIAGNOSIS — D500 Iron deficiency anemia secondary to blood loss (chronic): Secondary | ICD-10-CM

## 2017-03-28 LAB — CBC WITH DIFFERENTIAL (CANCER CENTER ONLY)
Basophils Absolute: 0 10*3/uL (ref 0.0–0.1)
Basophils Relative: 1 %
Eosinophils Absolute: 0.1 10*3/uL (ref 0.0–0.5)
Eosinophils Relative: 3 %
HCT: 34.8 % (ref 34.8–46.6)
Hemoglobin: 11.6 g/dL (ref 11.6–15.9)
Lymphocytes Relative: 34 %
Lymphs Abs: 1.2 10*3/uL (ref 0.9–3.3)
MCH: 29.5 pg (ref 25.1–34.0)
MCHC: 33.3 g/dL (ref 31.5–36.0)
MCV: 88.5 fL (ref 79.5–101.0)
Monocytes Absolute: 0.7 10*3/uL (ref 0.1–0.9)
Monocytes Relative: 19 %
Neutro Abs: 1.5 10*3/uL (ref 1.5–6.5)
Neutrophils Relative %: 43 %
Platelet Count: 121 10*3/uL — ABNORMAL LOW (ref 145–400)
RBC: 3.93 MIL/uL (ref 3.70–5.45)
RDW: 18.1 % — ABNORMAL HIGH (ref 11.2–14.5)
WBC Count: 3.5 10*3/uL — ABNORMAL LOW (ref 3.9–10.3)

## 2017-03-28 LAB — COMPREHENSIVE METABOLIC PANEL
ALT: 27 U/L (ref 0–55)
AST: 32 U/L (ref 5–34)
Albumin: 3.4 g/dL — ABNORMAL LOW (ref 3.5–5.0)
Alkaline Phosphatase: 95 U/L (ref 40–150)
Anion gap: 10 (ref 3–11)
BUN: 18 mg/dL (ref 7–26)
CO2: 25 mmol/L (ref 22–29)
Calcium: 9.4 mg/dL (ref 8.4–10.4)
Chloride: 108 mmol/L (ref 98–109)
Creatinine, Ser: 0.82 mg/dL (ref 0.60–1.10)
GFR calc Af Amer: >60 mL/min (ref >60)
GFR calc non Af Amer: >60 mL/min (ref >60)
Glucose, Bld: 110 mg/dL (ref 70–140)
Potassium: 3.3 mmol/L — ABNORMAL LOW (ref 3.5–5.1)
Sodium: 143 mmol/L (ref 136–145)
Total Bilirubin: 0.3 mg/dL (ref 0.2–1.2)
Total Protein: 6.6 g/dL (ref 6.4–8.3)

## 2017-03-28 LAB — RETICULOCYTES
RBC.: 3.93 MIL/uL (ref 3.70–5.45)
Retic Count, Absolute: 70.7 10*3/uL (ref 33.7–90.7)
Retic Ct Pct: 1.8 % (ref 0.7–2.1)

## 2017-03-28 LAB — IRON AND TIBC
Iron: 77 ug/dL (ref 41–142)
Saturation Ratios: 30 % (ref 21–57)
TIBC: 260 ug/dL (ref 236–444)
UIBC: 183 ug/dL

## 2017-03-28 MED ORDER — OXALIPLATIN CHEMO INJECTION 100 MG/20ML
75.0000 mg/m2 | Freq: Once | INTRAVENOUS | Status: AC
  Administered 2017-03-28: 130 mg via INTRAVENOUS
  Filled 2017-03-28: qty 20

## 2017-03-28 MED ORDER — PALONOSETRON HCL INJECTION 0.25 MG/5ML
INTRAVENOUS | Status: AC
  Filled 2017-03-28: qty 5

## 2017-03-28 MED ORDER — SODIUM CHLORIDE 0.9% FLUSH
10.0000 mL | INTRAVENOUS | Status: DC | PRN
  Filled 2017-03-28: qty 10

## 2017-03-28 MED ORDER — PALONOSETRON HCL INJECTION 0.25 MG/5ML
0.2500 mg | Freq: Once | INTRAVENOUS | Status: AC
  Administered 2017-03-28: 0.25 mg via INTRAVENOUS

## 2017-03-28 MED ORDER — SODIUM CHLORIDE 0.9 % IV SOLN
2200.0000 mg/m2 | INTRAVENOUS | Status: DC
  Administered 2017-03-28: 3800 mg via INTRAVENOUS
  Filled 2017-03-28: qty 76

## 2017-03-28 MED ORDER — LEUCOVORIN CALCIUM INJECTION 350 MG
400.0000 mg/m2 | Freq: Once | INTRAVENOUS | Status: AC
  Administered 2017-03-28: 688 mg via INTRAVENOUS
  Filled 2017-03-28: qty 34.4

## 2017-03-28 MED ORDER — DEXTROSE 5 % IV SOLN
Freq: Once | INTRAVENOUS | Status: AC
  Administered 2017-03-28: 11:00:00 via INTRAVENOUS

## 2017-03-28 MED ORDER — SODIUM CHLORIDE 0.9 % IV SOLN
Freq: Once | INTRAVENOUS | Status: AC
  Administered 2017-03-28: 12:00:00 via INTRAVENOUS
  Filled 2017-03-28: qty 5

## 2017-03-28 MED ORDER — HEPARIN SOD (PORK) LOCK FLUSH 100 UNIT/ML IV SOLN
500.0000 [IU] | Freq: Once | INTRAVENOUS | Status: DC | PRN
  Filled 2017-03-28: qty 5

## 2017-03-28 MED ORDER — SODIUM CHLORIDE 0.9% FLUSH
10.0000 mL | Freq: Once | INTRAVENOUS | Status: DC
  Filled 2017-03-28: qty 10

## 2017-03-28 NOTE — Patient Instructions (Signed)
[facility] Discharge Instructions for Patients Receiving Chemotherapy  Today you received the following chemotherapy agents: Oxaliplatin, Leucovorin, and 5FU.  To help prevent nausea and vomiting after your treatment, we encourage you to take your nausea medication as directed.   If you develop nausea and vomiting that is not controlled by your nausea medication, call the clinic.   BELOW ARE SYMPTOMS THAT SHOULD BE REPORTED IMMEDIATELY:  *FEVER GREATER THAN 100.5 F  *CHILLS WITH OR WITHOUT FEVER  NAUSEA AND VOMITING THAT IS NOT CONTROLLED WITH YOUR NAUSEA MEDICATION  *UNUSUAL SHORTNESS OF BREATH  *UNUSUAL BRUISING OR BLEEDING  TENDERNESS IN MOUTH AND THROAT WITH OR WITHOUT PRESENCE OF ULCERS  *URINARY PROBLEMS  *BOWEL PROBLEMS  UNUSUAL RASH Items with * indicate a potential emergency and should be followed up as soon as possible.  Feel free to call the clinic should you have any questions or concerns. The clinic phone number is [phone].  Please show the CHEMO ALERT CARD at check-in to the Emergency Department and triage nurse.    

## 2017-03-29 ENCOUNTER — Telehealth: Payer: Self-pay | Admitting: Hematology

## 2017-03-29 NOTE — Telephone Encounter (Signed)
OK, will discuss with her at next appointment. Thank you. Regan Rakers

## 2017-03-29 NOTE — Telephone Encounter (Signed)
Added appointments for 3/4 and 3/18 per 2/4 los and spoke with patient.   Per patient she is not going to do it, her body cannot take it. Per patient she has already told them this and spoke with LB about it at her visit 2/4 - she is 42 and if she cannot get away with it she is not taking anymore. Per patient you can send the pill home with her or whatever but she is not taking anymore chemo.   This message has been forwarded to YF/LB/desk nurse.

## 2017-03-30 ENCOUNTER — Inpatient Hospital Stay: Payer: Medicare HMO

## 2017-03-30 VITALS — BP 142/79 | HR 72 | Temp 98.2°F | Resp 18

## 2017-03-30 DIAGNOSIS — C787 Secondary malignant neoplasm of liver and intrahepatic bile duct: Secondary | ICD-10-CM | POA: Diagnosis not present

## 2017-03-30 DIAGNOSIS — Z5111 Encounter for antineoplastic chemotherapy: Secondary | ICD-10-CM | POA: Diagnosis not present

## 2017-03-30 DIAGNOSIS — C188 Malignant neoplasm of overlapping sites of colon: Secondary | ICD-10-CM | POA: Diagnosis not present

## 2017-03-30 MED ORDER — SODIUM CHLORIDE 0.9% FLUSH
10.0000 mL | INTRAVENOUS | Status: DC | PRN
  Administered 2017-03-30: 10 mL
  Filled 2017-03-30: qty 10

## 2017-03-30 MED ORDER — HEPARIN SOD (PORK) LOCK FLUSH 100 UNIT/ML IV SOLN
500.0000 [IU] | Freq: Once | INTRAVENOUS | Status: AC | PRN
  Administered 2017-03-30: 500 [IU]
  Filled 2017-03-30: qty 5

## 2017-03-31 DIAGNOSIS — C188 Malignant neoplasm of overlapping sites of colon: Secondary | ICD-10-CM | POA: Diagnosis not present

## 2017-04-05 ENCOUNTER — Ambulatory Visit (INDEPENDENT_AMBULATORY_CARE_PROVIDER_SITE_OTHER): Payer: Medicare HMO | Admitting: Family Medicine

## 2017-04-05 ENCOUNTER — Encounter: Payer: Self-pay | Admitting: Family Medicine

## 2017-04-05 VITALS — BP 130/84 | HR 102 | Temp 97.9°F | Ht 64.0 in | Wt 147.5 lb

## 2017-04-05 DIAGNOSIS — K121 Other forms of stomatitis: Secondary | ICD-10-CM | POA: Diagnosis not present

## 2017-04-05 DIAGNOSIS — M25571 Pain in right ankle and joints of right foot: Secondary | ICD-10-CM

## 2017-04-05 DIAGNOSIS — M79671 Pain in right foot: Secondary | ICD-10-CM | POA: Diagnosis not present

## 2017-04-05 DIAGNOSIS — M25572 Pain in left ankle and joints of left foot: Secondary | ICD-10-CM

## 2017-04-05 DIAGNOSIS — M79672 Pain in left foot: Secondary | ICD-10-CM | POA: Diagnosis not present

## 2017-04-05 DIAGNOSIS — C188 Malignant neoplasm of overlapping sites of colon: Secondary | ICD-10-CM

## 2017-04-05 MED ORDER — TRIAMCINOLONE ACETONIDE 0.1 % MT PSTE: 1.0000 "application " | PASTE | Freq: Three times a day (TID) | OROMUCOSAL | 1 refills | Status: DC

## 2017-04-05 NOTE — Progress Notes (Signed)
Subjective:    Patient ID: [name], female    DOB: 1940/05/25, 77 y.o.   MRN: [id number]  HPI  Here for sore in mouth  Thinks it is due to chemo   Has had for 6 days-just not getting better It is painful    Also needs handicapped form - for foot pain/limited walking (severe OA)   In the process of chemothearapy- Oxaliplatin and Leucovorin and 5 FU For colon cancer   (has spots on liver that bx neg but did shrink with chemo)  Having a rough time with it  She thinks she may take a break from chemo  Thinks it is affecting her hearing  Thinks it makes her hands feel like they are "drawing" as well as mouth  Feet are numb sometimes  This will be 7th infusion on the 18th   She is frustrated that 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 Mouth ulcer 04/05/2017  . Adenopathy   . Acute deep vein thrombosis (DVT) of proximal vein of left lower extremity ([place]) 11/24/2016  . Cancer of overlapping sites of colon ([place]) 09/23/2016  . Iron deficiency anemia due to chronic blood loss 09/23/2016  . Estrogen deficiency 07/30/2016  . Screening mammogram, encounter for 07/30/2016  . Routine general medical examination at a health care facility 07/27/2016  . Right buttock pain 06/27/2015  . Accidental fall 06/27/2015  . Left hip pain 06/27/2015  . Stroke (cerebrum) ([place]) 05/06/2015  . HTN (hypertension) 05/06/2015  . HLD (hyperlipidemia) 05/06/2015  . GERD (gastroesophageal reflux disease) 05/06/2015  . CAD (coronary artery disease) 05/06/2015  . Insect bites 09/17/2014  . Joint pain 05/01/2013  . Pain in joint, ankle and foot 03/02/2013  . Seborrheic keratosis 07/05/2012  . Ankle joint pain 07/16/2011  . 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 Callus of foot  10/16/2010  . Coronary artery disease, non-occlusive 10/16/2010  . INSOMNIA 02/02/2010  . HAND PAIN, BILATERAL 01/09/2010  . CRAMP OF LIMB 01/09/2010  . HYPERGLYCEMIA 07/29/2009  . ALLERGIC RHINITIS 12/04/2008  . Vitamin D deficiency 04/30/2008  . COLONIC POLYPS 06/28/2007  . IBS 03/17/2007  . INTERSTITIAL CYSTITIS 03/17/2007  . Osteoporosis 03/17/2007   Past Medical History:  Diagnosis Date  . Anemia   . Arthritis   . Astigmatism    both eyes  . Cancer [facility])    colon surgery done last chemo tx 01-17-17  . Colon cancer ([place])   . Coronary artery disease, non-occlusive   . Dyspnea    more so exertion  . Dysrhythmia    skipped beats every now and then  . GERD (gastroesophageal reflux disease)   . Glaucoma    both eyes right eye worse than left  . Hammer toes of both feet   . Headache    migraines with aura  . Heel spur    both heels and front  . HSV (herpes simplex virus) infection   . Hyperglycemia   . Hyperlipidemia   . IBS (irritable bowel syndrome)   . IC (interstitial cystitis)   . Insomnia disorder related to known organic factor   . Osteoporosis   . PONV (postoperative nausea and vomiting)  has had more surgeries, and had no problems  . Stroke ([place]) 2016  . Vitamin D deficiency    Past Surgical History:  Procedure Laterality Date  . ABDOMINAL HYSTERECTOMY     1 ovary and womb removed  . CARDIAC CATHETERIZATION  7/12   non obst dz -- not sure what yr  . CHOLECYSTECTOMY    . EUS N/A 02/03/2017   Procedure: UPPER ENDOSCOPIC ULTRASOUND (EUS) LINEAR;  Surgeon: [name], MD;  Location: [facility];  Service: Endoscopy;  Laterality: N/A;  . EYE SURGERY Bilateral    ioc for cataracts   . LAPAROSCOPIC RIGHT COLECTOMY Right 11/10/2016   Procedure: LAPAROSCOPIC EXTENDED RIGHT COLECTOMY;  Surgeon: [name], MD;  Location: [place];  Service: General;  Laterality: Right;  . PORTACATH PLACEMENT Left 12/22/2016   Procedure: INSERTION PORT-A-CATH;   Surgeon: [name], MD;  Location: [place];  Service: General;  Laterality: Left;  . ROTATOR CUFF REPAIR Bilateral    Social History   Tobacco Use  . Smoking status: Never Smoker  . Smokeless tobacco: Never Used  Substance Use Topics  . Alcohol use: No    Alcohol/week: 0.0 oz  . Drug use: No   Family History  Problem Relation Age of Onset  . Alcohol abuse Mother   . Heart attack Mother   . GER disease Mother   . GER disease Father   . Alcohol abuse Father   . Stroke Father   . Tuberculosis Father   . Heart attack Father   . Aneurysm Father   . Mental illness Sister   . Depression Sister   . Breast cancer Neg Hx    Allergies  Allergen Reactions  . Salmon [Fish Allergy] Anaphylaxis    This happens with "CANNED SALMON" only, if eaten uncooked  . Ampicillin Nausea Only    Has patient had a PCN reaction causing immediate rash, facial/tongue/throat swelling, SOB or lightheadedness with hypotension: No Has patient had a PCN reaction causing severe rash involving mucus membranes or skin necrosis: No Has patient had a PCN reaction that required hospitalization: No Has patient had a PCN reaction occurring within the last 10 years: No If all of the above answers are "NO", then may proceed with Cephalosporin use.   . Codeine Nausea And Vomiting  . Flagyl [Metronidazole] Nausea And Vomiting    Patient was taking this along with Neomycin and could not differentiate which med triggered the nausea & vomiting  . Meperidine Hcl Nausea And Vomiting  . Neomycin Nausea And Vomiting    Patient was taking this along with Flagyl and could not differentiate which med triggered the nausea & vomiting  . Penicillins Other (See Comments)    Has patient had a PCN reaction causing immediate rash, facial/tongue/throat swelling, SOB or lightheadedness with hypotension: Unknown Has patient had a PCN reaction causing severe rash involving mucus membranes or skin necrosis: No Has  patient had a PCN reaction that required hospitalization: No Has patient had a PCN reaction occurring within the last 10 years: No If all of the above answers are "NO", then may proceed with Cephalosporin use.   Marland Kitchen Propoxyphene Hcl Nausea And Vomiting  . Sulfa Antibiotics Hives  . Tramadol Hcl Nausea And Vomiting   Current Outpatient Medications on File Prior to Visit  Medication Sig Dispense Refill  . apixaban (ELIQUIS) 5 MG TABS tablet Take 1 tablet (5 mg total) by mouth 2 (two) times daily. 60 tablet 2  . atorvastatin (LIPITOR)  40 MG tablet Take 1 tablet (40 mg total) by mouth daily at 6 PM. 90 tablet 3  . b complex vitamins tablet Take 1 tablet by mouth daily.    . Ca Carbonate-Mag Hydroxide (ROLAIDS PO) Take 1-2 tablets by mouth every 8 (eight) hours as needed (for acid reflux).     . Cholecalciferol (VITAMIN D) 2000 units tablet Take 2,000 Units by mouth daily.    . famotidine (PEPCID) 20 MG tablet Take 1 tablet (20 mg total) by mouth at bedtime. 60 tablet 0  . gabapentin (NEURONTIN) 100 MG capsule Take 1 capsule (100 mg total) by mouth 2 (two) times daily as needed. (Patient taking differently: Take 100 mg by mouth 2 (two) times daily as needed (pain). ) 30 capsule 1  . Ibuprofen-Diphenhydramine Cit (IBUPROFEN PM) 200-38 MG TABS Take 1 tablet by mouth daily.    Marland Kitchen lactose free nutrition (BOOST) LIQD Take 237 mLs by mouth daily.    Marland Kitchen lidocaine-prilocaine (EMLA) cream Apply to affected area once 30 g 3  . Multiple Vitamin (MULTIVITAMIN WITH MINERALS) TABS tablet Take 1 tablet by mouth daily.    . ondansetron (ZOFRAN) 8 MG tablet Take 1 tablet (8 mg total) by mouth 2 (two) times daily as needed for refractory nausea / vomiting. Start on day 3 after chemotherapy. 30 tablet 1  . polyethylene glycol (MIRALAX / GLYCOLAX) packet Take 17 g by mouth daily. 14 each 0  . potassium chloride SA (K-DUR,KLOR-CON) 20 MEQ tablet Take 1 tablet (20 mEq total) by mouth daily. 30 tablet 1  . prochlorperazine  (COMPAZINE) 10 MG tablet Take 1 tablet (10 mg total) by mouth every 6 (six) hours as needed (Nausea or vomiting). 30 tablet 1  . bimatoprost (LUMIGAN) 0.01 % SOLN Place 1 drop into both eyes daily.    . COMBIGAN 0.2-0.5 % ophthalmic solution      No current facility-administered medications on file prior to visit.     Review of Systems  Constitutional: Positive for fatigue. Negative for activity change, appetite change, fever and unexpected weight change.  HENT: Positive for mouth sores. Negative for congestion, ear pain, facial swelling, rhinorrhea, sinus pressure, sinus pain, sneezing, sore throat and trouble swallowing.   Eyes: Negative for pain, redness and visual disturbance.  Respiratory: Negative for cough, shortness of breath and wheezing.   Cardiovascular: Negative for chest pain and palpitations.  Gastrointestinal: Negative for abdominal pain, blood in stool, constipation and diarrhea.  Endocrine: Negative for polydipsia and polyuria.  Genitourinary: Negative for dysuria, frequency and urgency.  Musculoskeletal: Positive for arthralgias. Negative for back pain and myalgias.  Skin: Negative for pallor and rash.  Allergic/Immunologic: Negative for environmental allergies.  Neurological: Positive for weakness. Negative for dizziness, syncope and headaches.  Hematological: Negative for adenopathy. Does not bruise/bleed easily.  Psychiatric/Behavioral: Negative for decreased concentration and dysphoric mood. The patient is not nervous/anxious.        Objective:   Physical Exam  Constitutional: She appears well-developed and well-nourished. No distress.  Well appearing   HENT:  Head: Normocephalic and atraumatic.  Right Ear: External ear normal.  Left Ear: External ear normal.  Nose: Nose normal.  Mouth/Throat: Oropharynx is clear and moist. No oropharyngeal exudate.  Several white based mouth ulcers inside lower buccal mucosa  No other lesions No swelling  Fair dentition    Eyes: Conjunctivae and EOM are normal. Pupils are equal, round, and reactive to light. Right eye exhibits no discharge. Left eye exhibits no discharge. No scleral icterus.  Neck: Normal range of motion. Neck supple.  Cardiovascular: Normal rate, regular rhythm and normal heart sounds.  Pulmonary/Chest: Effort normal and breath sounds normal. No respiratory distress. She has no wheezes. She has no rales.  Musculoskeletal: She exhibits no edema.  Lymphadenopathy:    She has no cervical adenopathy.  Neurological: She is alert. She has normal reflexes. No cranial nerve deficit. Coordination normal.  No tremor   Skin: Skin is warm and dry. No rash noted. No erythema.  Psychiatric: She has a normal mood and affect.          Assessment & Plan:   Problem List Items Addressed This Visit      Digestive   Cancer of overlapping sites of colon ([place]) - Primary (Chronic)    Pt is considering stopping chemo after next round due to intolerance and time out of work Overall doing fairly well       Mouth ulcer    Possibly due to current chemotherapy  tx with kenalog in orabase  Change toothpaste to baking soda/peroxide Avoid acidic foods/bev Update if not starting to improve in a week or if worsening    Meds ordered this encounter  Medications  . triamcinolone (KENALOG) 0.1 % paste    Sig: Use as directed 1 application in the mouth or throat 3 (three) times daily. On to mouth ulcer areas    Dispense:  5 g    Refill:  1           Other   Joint pain    Worse in feet - severe OA  Handicapped form filled out for parking

## 2017-04-05 NOTE — Patient Instructions (Signed)
Use the triamcinolone paste (kenalog in orabase) - dot on the mouth ulcers three times daily as needed   Also Anbesol for toothache pain - as well in between for pain as needed   Avoid acidic foods and beverages for now until better   Switch toothpaste to a baking soda/peroxide brand also   Update if not starting to improve in a week or if worsening

## 2017-04-05 NOTE — Assessment & Plan Note (Signed)
Pt is considering stopping chemo after next round due to intolerance and time out of work Overall doing fairly well

## 2017-04-05 NOTE — Assessment & Plan Note (Signed)
Worse in feet - severe OA  Handicapped form filled out for parking

## 2017-04-05 NOTE — Assessment & Plan Note (Signed)
Possibly due to current chemotherapy  tx with kenalog in orabase  Change toothpaste to baking soda/peroxide Avoid acidic foods/bev Update if not starting to improve in a week or if worsening    Meds ordered this encounter  Medications  . triamcinolone (KENALOG) 0.1 % paste    Sig: Use as directed 1 application in the mouth or throat 3 (three) times daily. On to mouth ulcer areas    Dispense:  5 g    Refill:  1

## 2017-04-08 NOTE — Progress Notes (Signed)
[place]  Telephone:([phone]) [phone] Fax:([phone]) [place]   Patient Care Team: Tower, [name], MD as PCP - General Yvonne [name], MD as Consulting Physician (Hematology) Yvonne Banister, MD as Attending Physician (Gastroenterology) Yvonne Klein, MD as Consulting Physician (General Surgery) 04/11/2017   CHIEF COMPLAINTS:  Follow up for Colorectal adenocarcinoma, moderately differentiated    Cancer of overlapping sites of colon ([place])   08/31/2016 Tumor Marker    Patient's tumor was tested for the following markers: CEA. Results of the tumor marker test revealed 2.0.      08/31/2016 Pathology Results    Colon biopsy Diagnosis: 1. Surgical [P], hepatic flexure. ADENOCARCINOMA. Moderately differentiated. 2. Surgical [P], transverse. ADENOCARCINOMA, Moderately differentiated.      08/31/2016 Initial Diagnosis    Colon cancer metastasized to liver ([place])      08/31/2016 Procedure    - 1) Polypoid mass at the hepatic flexure, 2cm, soft but clearly neoplastic.  - 2) Clearly malignant mass in the distal transverse colon; 4cm, firm, 1/2 cirumference, ulcerated. ' - 3) Four small (3-60m) scattered typical appearing adenomas throughout the colon, notremoved since she mistakenly stayed on her plavix for this procedure.      09/01/2016 Imaging    CT Abdomen Pelvis W Contrast IMPRESSION: There are 2 colonic lesions involving the proximal ascending colon and proximal transverse colon, as detailed above, highly concerning for colonic neoplasm. In addition, there are at least 2 indeterminate liver lesions which are suspicious for potential metastatic disease.       09/16/2016 Imaging    Mr Liver W Wo Contrast IMPRESSION: Lesion within central right lobe of liver exhibits of peripheral enhancement and is suspicious for liver metastasis. Lesion within caudate lobe of liver identified on recent CT does not have a corresponding signal or enhancement abnormality on  today's study. Within the inferior right lobe of liver there is an enhancing structure which is favored to represent an atypical benign hemangioma with liver metastasis felt less likely. The transverse colon lesion is again identified compatible with colonic adenocarcinoma.      10/04/2016 PET scan    PET 10/04/16 IMPRESSION: 1. Highly hypermetabolic proximal transverse colon mass, maximum SUV 30.8. Small focus of hypermetabolic activity in the mesentery just above this mass probably represents a local involved lymph node. 2. Moderately hypermetabolic ascending colon mass, maximum SUV 8.7. 3. Highly hypermetabolic enlarged portacaval lymph node, maximum SUV 16.7. 4. Focus of hypermetabolic activity near the duodenum bulb could be physiologic or due to an adjacent lymph node. 5. None of the liver lesions are discernibly hypermetabolic. Particularly the more cephalad right hepatic lobe lesion merits surveillance, it had nonspecific enhancement characteristics on prior cross-sectional imaging and only a thin enhancing wall which could conceivably predispose to false negative due to central necrosis. 6. Several tiny chronic pulmonary nodules are likely benign. 7. Other imaging findings of potential clinical significance: Aortic Atherosclerosis (ICD10-I70.0). Coronary atherosclerosis. Bilateral nonobstructive nephrolithiasis.      11/10/2016 Surgery    LAPAROSCOPIC EXTENDED RIGHT COLECTOMY by Dr. BBarry [name]      11/10/2016 Pathology Results    Diagnosis 11/10/16 Colon, segmental resection for tumor, Ascending and Transverse ADENOCARCINOMA OF THE ASCENDING COLON WITH EXTRA CELLULAR MUCIN, GRADE 3, SIZE 3.4 CM THE TUMOR INVADES MUSCULARIS PROPRIA POORLY DIFFERENTIATED ADENOCARCINOMA OF THE TRANSVERSE COLON, GRADE 4, SIZE 6.1 CM THE TUMOR INVADES THROUGH MUSCULARIS PROPRIA INTO PERICOLONIC SOFT TISSUE EXTRAMURAL SATELLITE TUMOR NODULES IS IDENTIFIED (X1) ALL MARGINS OF RESECTION ARE NEGATIVE  FOR CARCINOMA METASTATIC CARCINOMA IN ONE OF SIXTY LYMPH NODES (1/60) TABULAR ADENOMA X1      12/28/2016 Imaging    MRI abdomen w/wo contrast IMPRESSION: Motion degraded images.  Two indeterminate right liver lesions measuring up to 1.6 cm, as described above. Continued attention on follow-up is suggested.  Two periportal lymph nodes measuring up to 1.9 cm, suspicious for nodal metastases.  Postsurgical changes related to right colon resection.       12/29/2016 -  Chemotherapy    FOLFOX every 2 weeks starting 12/29/16       02/03/2017 Procedure    EUS Per Dr. Ardis [name] Endoscopic Finding Findings: The examined esophagus was endoscopically normal. The entire examined stomach was endoscopically normal. The examined duodenum was endoscopically normal. Endosonographic Finding (limited evaluation for tissue acquisition): 1. One enlarged lymph node (vs soft tissue mass) was visualized in the porta hepatis region. It measured 16 mm in maximal cross-sectional diameter. The node (vs soft tissue) was oval, isoechoic and had poorly defined margins. Fine needle aspiration for cytology was performed. Color Doppler imaging was utilized prior to needle puncture to confirm a lack of significant vascular structures within the needle path. Threepasses were made with the 25 gauge needle using a transgastric approach. 2. CBD was normal, non-dilated 3. Limited views of the liver, spleen, pancreas were all normal.  Impression: One periportal soft tissue mass (lymphnode?) measuring 59m was noted and sampled with FNA.      02/03/2017 Pathology Results    Diagnosis FINE NEEDLE ASPIRATION, ENDOSCOPIC, PERI PORTAL NODE (SPECIMEN 1 OF 1 COLLECTED 02/03/17): NO MALIGNANT CELLS IDENTIFIED. SCANT LYMPHOID AND BENIGN GLANDULAR ELEMENTS.      03/09/2017 Imaging    IMPRESSION: Interval decrease in several tiny low-attenuation liver lesions the, consistent with improving hepatic  metastases.  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        HISTORY OF PRESENTING ILLNESS: 09/23/16 Yvonne Halsted77y.o. female is here because of Colorectal adenocarcinoma, moderately differentiated  Referred by Gastroenterologist, Dr. DOwens Lofflerfor abnormal colon biopsy. Accompanied by family friend.   Pt initially presented with one episode of rectal bleeding one year ago, but did not pursue further workup until lately, she was referred to Gastroenterologist, Dr. DOwens Loffleron 07/13/16 for Routine colonoscopy. She was referred to him by Dr. MRoque LiasA. Tower for repeat colonoscopy for colon CA screening. Pt last colonoscopy was with Dr. JLeonie Douglasin August 2011 that was completed for hx of polyps and was significant for hemorrhoids and otherwise negative.   On 08/31/16, pt underwent screening colonoscopy, which unfortunately showed 2 masses in the proximal and distant transverse colon, biopsy both showed moderately differentiated adenocarcinoma.   Pt had a CT Abdomen Pelvis that was ordered by Dr. DOwens Lofflerand completed on 09/01/16 and significant for colonic neoplasm and 2 indeterminate liver lesions suspicious for potential metastatic dx.   Pt had a CT Chest W Constrast that was ordered by Dr. DOwens Lofflerand completed on 09/06/16 and significant for no metastasis.    Pt had MR Liver W WO Contrast that was ordered by Dr. DOwens Lofflerand completed on 09/16/16 and significant for colonic adenocarcinoma and indeterminate liver lesions.  Pt notes that she was referred to the office by Dr. JArdis [name] Pt has an appointment with Surgeon, Dr. BBarry Dienestomorrow, 8/3 to discuss her options. She notes that Dr. JArdis Hughsrecommended a liver biopsy. Pt states that she had 1 year ago  she had one large episode of rectal bleeding following consuming a milkshake, 1 episode of emesis, and a large episode of rectal bleeding that  resolved on its own. Denies recurrent bleeding following initial rectal bleeding. Denies being evaluated last year for her rectal bleeding due to having issues with finding a GI specialist. Pt last colonoscopy was 7 years ago. Pt reports that due to her chronic constipation, she uses suppositories and OTC laxatives. She states that she eats mainly meat and starches due to her dislike for vegetables. She notes that she recently lost approximately 10 lbs over the past year. 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 She states that she lives with her husband and has 3 older children, 2 in [place] and 1 in [place]. Pt friend notes that the pt grandchildren intermittently help her out. She states that her husband has severe medical issues with internal bleeding and is currently on oxygen. Pt reports that she completes all of the housework and outside work due to her husband being unable to complete daily activities.   Pt has intermittent GERD that she used to take Nexium for. Denies PMHx of HTN, cardiac issues. Denies taking HTN medications. Denies MI, but notes that she was evaluated for CP symptoms and was found to have a 20% blockage in her arteries. Pt reports that she had a stroke with resolved residual right sided weakness summer 2017 that she was evaluated the next day at [facility] and was informed by her horse vet to be evaluated. Pt states that she took 2 ASA following her initial symptoms. Pt reports that she takes ASA and plavix for. She states that she stopped taking her plavix 2 weeks ago following the colonoscopy. Pt sates that she takes Rx Lipitor for cholesterol. Pt reports that she takes multi-vitamin and that [name] recommended that she takes vitamin-D.   Pt had cholecystectomy and partial hysterectomy with one ovary left. Pt denies family hx of CA. Denies smoking cigarettes, but reports that her  husband used to smoke cigarettes. Pt denies ETOH use at this time.   On review of systems, pt denies fever, chills. Endorses 10 lb weight loss, decreased energy levels. Denies pain. Pt reports constipation treated with laxatives. Pt reports that she has hemorrhoids. Pt denies abdominal pain, nausea, vomiting, abdominal distension.    CURRENT THERAPY:  FOLFOX every 2 weeks starting 12/29/16   INTERVAL HISTORY:  [name] is here for a follow up. She presents to the clinic today by herself. She reports she has had watery diarrhea 1-2 times a day for a few days that stopped yesterday. She is requesting to have today as her last chemo treatment. 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 She also would like to stop because of the financial burden as well. She lives at home with her husband.   On review of systems, pt denies new pain, or any other complaints at this time. Pertinent positives are listed and detailed within the above HPI.   MEDICAL HISTORY:  Past Medical History:  Diagnosis Date  . Anemia   . Arthritis   . Astigmatism    both eyes  . Cancer [facility])    colon surgery done last chemo tx 01-17-17  . Colon cancer ([place])   . Coronary artery disease, non-occlusive   . Dyspnea  more so exertion  . Dysrhythmia    skipped beats every now and then  . GERD (gastroesophageal reflux disease)   . Glaucoma    both eyes right eye worse than left  . Hammer toes of both feet   . Headache    migraines with aura  . Heel spur    both heels and front  . HSV (herpes simplex virus) infection   . Hyperglycemia   . Hyperlipidemia   . IBS (irritable bowel syndrome)   . IC (interstitial cystitis)   . Insomnia disorder related to known organic factor   . Osteoporosis   . PONV (postoperative nausea and vomiting)    has had more surgeries, and had no problems  . Stroke ([place]) 2016  . Vitamin D deficiency     SURGICAL  HISTORY: Past Surgical History:  Procedure Laterality Date  . ABDOMINAL HYSTERECTOMY     1 ovary and womb removed  . CARDIAC CATHETERIZATION  7/12   non obst dz -- not sure what yr  . CHOLECYSTECTOMY    . EUS N/A 02/03/2017   Procedure: UPPER ENDOSCOPIC ULTRASOUND (EUS) LINEAR;  Surgeon: Yvonne Banister, MD;  Location: [facility];  Service: Endoscopy;  Laterality: N/A;  . EYE SURGERY Bilateral    ioc for cataracts   . LAPAROSCOPIC RIGHT COLECTOMY Right 11/10/2016   Procedure: LAPAROSCOPIC EXTENDED RIGHT COLECTOMY;  Surgeon: Yvonne Klein, MD;  Location: [facility];  Service: General;  Laterality: Right;  . PORTACATH PLACEMENT Left 12/22/2016   Procedure: INSERTION PORT-A-CATH;  Surgeon: Yvonne Klein, MD;  Location: [facility];  Service: General;  Laterality: Left;  . ROTATOR CUFF REPAIR Bilateral     SOCIAL HISTORY: Social History   Socioeconomic History  . Marital status: Married    Spouse name: Not on file  . Number of children: 4  . Years of education: Not on file  . Highest education level: Not on file  Social Needs  . Financial resource strain: Not on file  . Food insecurity - worry: Not on file  . Food insecurity - inability: Not on file  . Transportation needs - medical: Not on file  . Transportation needs - non-medical: Not on file  Occupational History  . Occupation: reitred    Employer: RETIRED  Tobacco Use  . Smoking status: Never Smoker  . Smokeless tobacco: Never Used  Substance and Sexual Activity  . Alcohol use: No    Alcohol/week: 0.0 oz  . Drug use: No  . Sexual activity: Not on file  Other Topics Concern  . Not on file  Social History Narrative   Rides horses         Specialty providers    [facility]   Cardio- [facility]   Ortho--[place]   Rheum-[facility]   urol-[facility]   [facility]-- [facility]    FAMILY HISTORY: Family History  Problem Relation Age of Onset  . Alcohol abuse Mother   . Heart attack Mother   . GER disease Mother    . GER disease Father   . Alcohol abuse Father   . Stroke Father   . Tuberculosis Father   . Heart attack Father   . Aneurysm Father   . Mental illness Sister   . Depression Sister   . Breast cancer Neg Hx     ALLERGIES:  is allergic to salmon [fish allergy]; ampicillin; codeine; flagyl [metronidazole]; meperidine hcl; neomycin; penicillins; propoxyphene hcl; sulfa antibiotics; and tramadol hcl.  MEDICATIONS:  Current Outpatient Medications  Medication Sig Dispense Refill  . apixaban (ELIQUIS) 5 MG TABS tablet Take 1 tablet (5 mg total) by mouth 2 (two) times daily. 60 tablet 2  . atorvastatin (LIPITOR) 40 MG tablet Take 1 tablet (40 mg total) by mouth daily at 6 PM. 90 tablet 3  . b complex vitamins tablet Take 1 tablet by mouth daily.    . bimatoprost (LUMIGAN) 0.01 % SOLN Place 1 drop into both eyes daily.    . Ca Carbonate-Mag Hydroxide (ROLAIDS PO) Take 1-2 tablets by mouth every 8 (eight) hours as needed (for acid reflux).     . Cholecalciferol (VITAMIN D) 2000 units tablet Take 2,000 Units by mouth daily.    . COMBIGAN 0.2-0.5 % ophthalmic solution     . famotidine (PEPCID) 20 MG tablet Take 1 tablet (20 mg total) by mouth at bedtime. 60 tablet 0  . gabapentin (NEURONTIN) 100 MG capsule Take 1 capsule (100 mg total) by mouth 2 (two) times daily as needed. (Patient taking differently: Take 100 mg by mouth 2 (two) times daily as needed (pain). ) 30 capsule 1  . Ibuprofen-Diphenhydramine Cit (IBUPROFEN PM) 200-38 MG TABS Take 1 tablet by mouth daily.    Marland Kitchen lactose free nutrition (BOOST) LIQD Take 237 mLs by mouth daily.    Marland Kitchen lidocaine-prilocaine (EMLA) cream Apply to affected area once 30 g 3  . Multiple Vitamin (MULTIVITAMIN WITH MINERALS) TABS tablet Take 1 tablet by mouth daily.    . ondansetron (ZOFRAN) 8 MG tablet Take 1 tablet (8 mg total) by mouth 2 (two) times daily as needed for refractory nausea / vomiting. Start on day 3 after chemotherapy. 30 tablet 1  . prochlorperazine  (COMPAZINE) 10 MG tablet Take 1 tablet (10 mg total) by mouth every 6 (six) hours as needed (Nausea or vomiting). 30 tablet 1  . triamcinolone (KENALOG) 0.1 % paste Use as directed 1 application in the mouth or throat 3 (three) times daily. On to mouth ulcer areas 5 g 1  . polyethylene glycol (MIRALAX / GLYCOLAX) packet Take 17 g by mouth daily. (Patient not taking: Reported on 04/11/2017) 14 each 0  . potassium chloride SA (K-DUR,KLOR-CON) 20 MEQ tablet Take 1 tablet (20 mEq total) by mouth daily. (Patient not taking: Reported on 04/11/2017) 30 tablet 1   No current facility-administered medications for this visit.    Facility-Administered Medications Ordered in Other Visits  Medication Dose Route Frequency Provider Last Rate Last Dose  . fluorouracil (ADRUCIL) 3,800 mg in sodium chloride 0.9 % 74 mL chemo infusion  2,200 mg/m2 (Treatment Plan Recorded) Intravenous 1 day or 1 dose Yvonne [name], MD   3,800 mg at 04/11/17 1259    REVIEW OF SYSTEMS:  Constitutional: Denies fevers, chills or abnormal night sweats (+) low appetite and fatigue  Eyes: Denies blurriness of vision, double vision or watery eyes Ears, nose, mouth, throat, and face: Denies mucositis or sore throat Respiratory: Denies cough, dyspnea or wheezes Cardiovascular: Denies palpitation, chest discomfort or lower extremity swelling Gastrointestinal:  Denies nausea, heartburn (+) diarrhea  Skin: Denies abnormal skin rashes Lymphatics: Denies new lymphadenopathy or easy bruising Neurological:Denies numbness, tingling or new weaknesses Behavioral/Psych: Mood is stable, no new changes  All other systems were reviewed with the patient and are negative.  PHYSICAL EXAMINATION: ECOG PERFORMANCE STATUS: 2  Vitals:   04/11/17 0957  BP: 123/64  Pulse: 73  Resp: 18  Temp: 98.1 F (36.7 C)  SpO2: 100%   Filed Weights   04/11/17 0957  Weight: 147 lb (66.7 kg)   GENERAL:alert, no distress and comfortable SKIN: skin color, texture,  turgor are normal, no rashes or significant lesions EYES: normal, conjunctiva are pink and non-injected, sclera clear OROPHARYNX:no exudate, no erythema and lips, buccal mucosa, and tongue normal  NECK: supple, thyroid normal size, non-tender, without nodularity LYMPH:  no palpable lymphadenopathy in the cervical, axillary or inguinal LUNGS: clear to auscultation and percussion with normal breathing effort HEART: regular rate & rhythm and no murmurs and no lower extremity edema ABDOMEN:abdomen soft, non-tender and normal bowel sounds. No organomegaly noted. (+) midline incision above umbilical , well healed, non-tender, no scar tissue.  Musculoskeletal:no cyanosis of digits and no clubbing  PSYCH: alert & oriented x 3 with fluent speech NEURO: no focal motor/sensory deficits   LABORATORY DATA:  I have reviewed the data as listed CBC Latest Ref Rng & Units 04/11/2017 03/28/2017 03/15/2017  WBC 3.9 - 10.3 K/uL 4.1 3.5(L) 3.9  Hemoglobin 11.6 - 15.9 g/dL - - 11.6  Hematocrit 34.8 - 46.6 % 34.8 34.8 35.5  Platelets 145 - 400 K/uL 98(L) 121(L) 146    CMP Latest Ref Rng & Units 04/11/2017 03/28/2017 03/15/2017  Glucose 70 - 140 mg/dL 108 110 96  BUN 7 - 26 mg/dL _0 Creatinine 0.60 - 1.10 mg/dL 0.94 0.82 0.88  Sodium 136 - 145 mmol/L 141 143 140  Potassium 3.5 - 5.1 mmol/L 3.8 3.3(L) 3.8  Chloride 98 - 109 mmol/L 107 108 107  CO2 22 - 29 mmol/L _1 Calcium 8.4 - 10.4 mg/dL 9.7 9.4 9.5  Total Protein 6.4 - 8.3 g/dL 6.6 6.6 6.7  Total Bilirubin 0.2 - 1.2 mg/dL 0.4 0.3 0.3  Alkaline Phos 40 - 150 U/L 111 95 93  AST 5 - 34 U/L 42(H) 32 32  ALT 0 - 55 U/L 54 27 33    CEA 08/31/16: 2.0   PATHOLOGY REPORT:  Diagnosis 11/10/16 Colon, segmental resection for tumor, Ascending and Transverse ADENOCARCINOMA OF THE ASCENDING COLON WITH EXTRA CELLULAR MUCIN, GRADE 3, SIZE 3.4 CM THE TUMOR INVADES MUSCULARIS PROPRIA POORLY DIFFERENTIATED ADENOCARCINOMA OF THE TRANSVERSE COLON, GRADE 4,  SIZE 6.1 CM THE TUMOR INVADES THROUGH MUSCULARIS PROPRIA INTO PERICOLONIC SOFT TISSUE EXTRAMURAL SATELLITE TUMOR NODULES IS IDENTIFIED (X1) ALL MARGINS OF RESECTION ARE NEGATIVE FOR CARCINOMA METASTATIC CARCINOMA IN ONE OF SIXTY LYMPH NODES (1/60) TABULAR ADENOMA X1 Microscopic Comment COLON AND RECTUM (INCLUDING TRANS-ANAL RESECTION): Specimen: Right and transverse colon Procedure: Segmental resection Tumor site: Ascending and transverse colon Specimen integrity: Intact Macroscopic intactness of mesorectum: Not applicable: X Complete: NA Near complete: NA Incomplete: NA Cannot be determined (specify): NA Macroscopic tumor perforation: Pericolonic soft tissue Invasive tumor: Maximum size: 6.1 cm and 3.4 cm Histologic type(s): Adenocarcinoma Histologic grade and differentiation: G1: well differentiated/low grade Microscopic Comment(continued) G2: moderately differentiated/low grade G3: poorly differentiated/high grade G4: undifferentiated/high grade Type of polyp in which invasive carcinoma arose: Tubular adenoma Microscopic extension of invasive tumor: Pericolonic soft tissue Lymph-Vascular invasion: Identified Peri-neural invasion: Negative Tumor deposit(s) (discontinuous extramural extension): Present Resection margins: Proximal margin: Negative Distal margin: Negative Circumferential (radial) (posterior ascending, posterior descending; lateral and posterior mid-rectum; and entire lower 1/3 rectum): Negative Mesenteric margin (sigmoid and transverse): Negative Distance closest margin (if all above margins negative): 1.5 cm from the mesenteric margin Trans-anal resection margins only: Deep margin: NA Mucosal Margin: NA Distance closest mucosal margin (if negative): NA Treatment effect (neo-adjuvant therapy): Negative Additional polyp(s): Tubular adenoma Non-neoplastic findings: Unremarkble Lymph nodes:  number examined 34; number positive: 1 Pathologic Staging: pT3  (m), pN1c, pMx Ancillary studies: Ordered    Colon biopsy, 08/31/16 Diagnosis 1. Surgical [P], hepatic flexure - ADENOCARCINOMA. Moderately differentiated.  2. Surgical [P], transverse - ADENOCARCINOMA, Moderately differentiated.  RADIOGRAPHIC STUDIES: I have personally reviewed the radiological images as listed and agreed with the findings in the report. No results found.  PROCEDURES   COLONOSCOPY 08/31/2016 DR. JACOBS  - 1) Polypoid mass at the hepatic flexure, 2cm, soft but clearly neoplastic. This was biopsied and the site was injected with Spot. - 2) Clearly malignant mass in the distal transverse colon; 4cm, firm, 1/2 cirumference, ulcerated. This wsa biopsied and the site was also injected with Spot. - 3) Four small (3-61m) scattered typical appearing adenomas throughout the colon, not removed since she mistakenly stayed on her plavix for this procedure.  ASSESSMENT & PLAN:   [name] is a 77y.o. caucasian female with colorectal adenocarcinoma, moderately differentiated, found on screening colonoscopy.   1. Synchronized colon adenocarcinoma, moderately differentiated, in hepatic fracture and distal transverse colon, pT3(m)N1cMx, at least stage IIIB, MSI-H -I have reviewed her colonoscopy, biopsy and CT and MRI image findings with patient and her friend in great details. -She has to synchronized colon mass in the hepatic flexure and distal transverse colon, bost biopsy showed moderately-differentiated adenocarcinoma -CT and abdominal MRI showed 2 lesions in right lobe of liver, at least one lesion is very suspicious for metastatic disease, and enlarged periportal lymph nodes.  --We discussed 10/04/16 PET. There is no definitive distant metastasis on the PET scan. There was no uptake in her liver, liver metastasis is less likely, although not ruled out. There is a hypermetabolic lymph node in the portacaval region measuring 1.5 cm with SUV 16.7, compatible with  malignancy. --12/28/16 MRI of abdomen revealed no significant change in liver lesions, but noted slightly enlarged periportal lymphadenopathy from previous scan, suspicious for nodal metastasis. -I recommend EUS biopsy of the portacaval lymph node, which was negative  -She has been on chemotherapy FOLFOX, it has become more difficult for her continue treatment, due to side effects.  -She is requesting to stop IV chemo today and have this be her last treatment because of financial burden and because she wants to get back to her life and her horses. Since metastatic disease is not ruled out, I recommend her to continue chemotherapy a little longer, for at least a total of 6 months.  I suggested we change FOLFOX to Xeloda so she can stay at home more. I discussed the potential side effects of Xeloda, especially hand-and-foot syndrome, diarrhea, etc., she voiced good understanding and agrees to proceed.  If she has high co-pay for Xeloda, hopefully we can ind financial assistance for her.  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 -Lab reviewed, she still has mild thrombocytopenia, and her diarrhea just resolved yesterday.  I recommend her just do 5-FU pump today and leucovorin today. She would like some time for her body to recover. -F/u on 04/25/17  2. 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 I recommend she sees a gDietitianto rule out lynch syndrome. She agreed to see gentics  -Had genetics apt on 1/2, she consented to genetics testing -results pending   3. Anemia, iron deficient  -s/p IV Feraheme 11/26, 12/17 -Anemia resolved, iron studies adequate -will continue monitoring  4. Liver lesions -Her initial staging CT scan showed numerous small liver lesions, indeterminate, those are not hypermetabolic on PET scan. -Previously enlarged portal node was  negative for metastasis, but also appears to have shrunk while on chemotherapy -I recommended to continue chemotherapy, but the patient does not wish to schedule more chemo after next cycle, she will come for f/u on 2/18 to discuss options   5. History of stroke  -No residual neuro deficits. She functions well at home. -she is on aspirin and Plavix   6. HTN -her BP high in clinic previously, pt states it's been normal at home -will monitor  7. Paresthesias - fingers and feet  -Denies numbness or tingling, but cold sensitivity has increased, lasting 2 weeks between chemo cycles -function and dexterity remain intact  8. 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 Diarrhea, hypokalemia -she had 5 days of diarrhea last week, 2-3 episodes per day; she used imodium infrequently -she has mild hypokalemia, K 3.3 -she will continue oral K supplement and increase K in her diet -maximize imodium for relief; reviewed dosing instructions  Plan:  -5-FU pump and leucovorin today, will hold oxaliplatin due to her tolerance issue. -Lab, flush and f/u with Lacie or me in 2 weeks  -plan to start Xeloda after next visit   No orders of the defined types were placed in this encounter.   All questions were answered. The patient knows to call the clinic with any problems, questions or concerns.  I spent 20 minutes counseling the patient face to face. The total time spent in the appointment was 25 minutes and more than 50% was on counseling.  This document serves as a  record of services personally performed by YaTruitt MerleMD. It was created on her behalf by DaTheresia Bougha trained medical scribe. The creation of this record is based on the scribe's personal observations and the provider's statements to them.   I have reviewed the above documentation for accuracy and completeness, and I agree with the above.    YaTruitt MerleMD 04/11/2017 1:38 PM

## 2017-04-11 ENCOUNTER — Inpatient Hospital Stay: Payer: Medicare HMO

## 2017-04-11 ENCOUNTER — Telehealth: Payer: Self-pay | Admitting: Hematology

## 2017-04-11 ENCOUNTER — Inpatient Hospital Stay (HOSPITAL_BASED_OUTPATIENT_CLINIC_OR_DEPARTMENT_OTHER): Payer: Medicare HMO | Admitting: Hematology

## 2017-04-11 ENCOUNTER — Telehealth: Payer: Self-pay | Admitting: Genetics

## 2017-04-11 ENCOUNTER — Encounter: Payer: Self-pay | Admitting: Hematology

## 2017-04-11 ENCOUNTER — Encounter: Payer: Self-pay | Admitting: Genetics

## 2017-04-11 ENCOUNTER — Other Ambulatory Visit: Payer: Medicare HMO

## 2017-04-11 ENCOUNTER — Ambulatory Visit: Payer: Medicare HMO | Admitting: Nurse Practitioner

## 2017-04-11 ENCOUNTER — Ambulatory Visit: Payer: Medicare HMO

## 2017-04-11 VITALS — BP 123/64 | HR 73 | Temp 98.1°F | Resp 18 | Ht 64.0 in | Wt 147.0 lb

## 2017-04-11 DIAGNOSIS — C189 Malignant neoplasm of colon, unspecified: Secondary | ICD-10-CM

## 2017-04-11 DIAGNOSIS — C188 Malignant neoplasm of overlapping sites of colon: Secondary | ICD-10-CM | POA: Diagnosis not present

## 2017-04-11 DIAGNOSIS — C787 Secondary malignant neoplasm of liver and intrahepatic bile duct: Principal | ICD-10-CM

## 2017-04-11 DIAGNOSIS — I4891 Unspecified atrial fibrillation: Secondary | ICD-10-CM

## 2017-04-11 DIAGNOSIS — Z5111 Encounter for antineoplastic chemotherapy: Secondary | ICD-10-CM | POA: Diagnosis not present

## 2017-04-11 DIAGNOSIS — I10 Essential (primary) hypertension: Secondary | ICD-10-CM

## 2017-04-11 DIAGNOSIS — D500 Iron deficiency anemia secondary to blood loss (chronic): Secondary | ICD-10-CM

## 2017-04-11 LAB — COMPREHENSIVE METABOLIC PANEL
ALT: 54 U/L (ref 0–55)
AST: 42 U/L — ABNORMAL HIGH (ref 5–34)
Albumin: 3.3 g/dL — ABNORMAL LOW (ref 3.5–5.0)
Alkaline Phosphatase: 111 U/L (ref 40–150)
Anion gap: 8 (ref 3–11)
BUN: 14 mg/dL (ref 7–26)
CO2: 26 mmol/L (ref 22–29)
Calcium: 9.7 mg/dL (ref 8.4–10.4)
Chloride: 107 mmol/L (ref 98–109)
Creatinine, Ser: 0.94 mg/dL (ref 0.60–1.10)
GFR calc Af Amer: >60 mL/min (ref >60)
GFR calc non Af Amer: 57 mL/min — ABNORMAL LOW (ref >60)
Glucose, Bld: 108 mg/dL (ref 70–140)
Potassium: 3.8 mmol/L (ref 3.5–5.1)
Sodium: 141 mmol/L (ref 136–145)
Total Bilirubin: 0.4 mg/dL (ref 0.2–1.2)
Total Protein: 6.6 g/dL (ref 6.4–8.3)

## 2017-04-11 LAB — CBC WITH DIFFERENTIAL (CANCER CENTER ONLY)
Basophils Absolute: 0 10*3/uL (ref 0.0–0.1)
Basophils Relative: 0 %
Eosinophils Absolute: 0.1 10*3/uL (ref 0.0–0.5)
Eosinophils Relative: 2 %
HCT: 34.8 % (ref 34.8–46.6)
Hemoglobin: 11.5 g/dL — ABNORMAL LOW (ref 11.6–15.9)
Lymphocytes Relative: 39 %
Lymphs Abs: 1.6 10*3/uL (ref 0.9–3.3)
MCH: 29.5 pg (ref 25.1–34.0)
MCHC: 33 g/dL (ref 31.5–36.0)
MCV: 89.2 fL (ref 79.5–101.0)
Monocytes Absolute: 0.7 10*3/uL (ref 0.1–0.9)
Monocytes Relative: 17 %
Neutro Abs: 1.7 10*3/uL (ref 1.5–6.5)
Neutrophils Relative %: 42 %
Platelet Count: 98 10*3/uL — ABNORMAL LOW (ref 145–400)
RBC: 3.9 MIL/uL (ref 3.70–5.45)
RDW: 18.9 % — ABNORMAL HIGH (ref 11.2–14.5)
WBC Count: 4.1 10*3/uL (ref 3.9–10.3)

## 2017-04-11 LAB — RETICULOCYTES
RBC.: 3.9 MIL/uL (ref 3.70–5.45)
Retic Count, Absolute: 93.6 10*3/uL — ABNORMAL HIGH (ref 33.7–90.7)
Retic Ct Pct: 2.4 % — ABNORMAL HIGH (ref 0.7–2.1)

## 2017-04-11 LAB — FERRITIN: Ferritin: 500 ng/mL — ABNORMAL HIGH (ref 9–269)

## 2017-04-11 MED ORDER — DEXTROSE 5 % IV SOLN
400.0000 mg/m2 | Freq: Once | INTRAVENOUS | Status: AC
  Administered 2017-04-11: 688 mg via INTRAVENOUS
  Filled 2017-04-11: qty 34.4

## 2017-04-11 MED ORDER — DEXTROSE 5 % IV SOLN
Freq: Once | INTRAVENOUS | Status: AC
  Administered 2017-04-11: 11:00:00 via INTRAVENOUS

## 2017-04-11 MED ORDER — ONDANSETRON HCL 4 MG/2ML IJ SOLN
INTRAMUSCULAR | Status: AC
  Filled 2017-04-11: qty 4

## 2017-04-11 MED ORDER — ONDANSETRON HCL 4 MG/2ML IJ SOLN
8.0000 mg | Freq: Once | INTRAMUSCULAR | Status: AC
  Administered 2017-04-11: 8 mg via INTRAVENOUS

## 2017-04-11 MED ORDER — CAPECITABINE 500 MG PO TABS: ORAL_TABLET | ORAL | 2 refills | Status: DC

## 2017-04-11 MED ORDER — SODIUM CHLORIDE 0.9 % IV SOLN: Freq: Once | INTRAVENOUS | Status: DC

## 2017-04-11 MED ORDER — SODIUM CHLORIDE 0.9% FLUSH
10.0000 mL | INTRAVENOUS | Status: DC | PRN
  Administered 2017-04-11: 10 mL
  Filled 2017-04-11: qty 10

## 2017-04-11 MED ORDER — SODIUM CHLORIDE 0.9 % IV SOLN
2200.0000 mg/m2 | INTRAVENOUS | Status: DC
  Administered 2017-04-11: 3800 mg via INTRAVENOUS
  Filled 2017-04-11: qty 76

## 2017-04-11 NOTE — Progress Notes (Signed)
Today's treatment plan changed due to platelet count.

## 2017-04-11 NOTE — Telephone Encounter (Signed)
Met with the patient in infusion to discuss negative germline results and tumor testing results.  Provided her with copy of results and summary letter.   This normal result is reassuring and indicates that it is unlikely [name]'s cancer is due to a hereditary cause.  It is unlikely that there is an increased risk of another cancer due to a mutation in one of these genes.  However, genetic testing is not perfect, and cannot definitively rule out a hereditary cause.  It will be important for her to keep in contact with genetics to learn if any additional testing may be needed in the future.     Recommended her children and siblings have colonoscopies starting at age 23 and repeating every 5 years or as recommended by their doctors.

## 2017-04-11 NOTE — Patient Instructions (Addendum)
Return to [facility] @ 11am on Wednesday for pump disconnect.  Discharge Instructions for Patients Receiving Chemotherapy  Today you received the following chemotherapy agents:  Leucovorin and 5FU.  To help prevent nausea and vomiting after your treatment, we encourage you to take your nausea medication as directed.   If you develop nausea and vomiting that is not controlled by your nausea medication, call the clinic.   BELOW ARE SYMPTOMS THAT SHOULD BE REPORTED IMMEDIATELY:  *FEVER GREATER THAN 100.5 F  *CHILLS WITH OR WITHOUT FEVER  NAUSEA AND VOMITING THAT IS NOT CONTROLLED WITH YOUR NAUSEA MEDICATION  *UNUSUAL SHORTNESS OF BREATH  *UNUSUAL BRUISING OR BLEEDING  TENDERNESS IN MOUTH AND THROAT WITH OR WITHOUT PRESENCE OF ULCERS  *URINARY PROBLEMS  *BOWEL PROBLEMS  UNUSUAL RASH Items with * indicate a potential emergency and should be followed up as soon as possible.  Feel free to call the clinic should you have any questions or concerns. The clinic phone number is [phone].  Please show the [facility] at check-in to the Emergency Department and triage nurse.

## 2017-04-11 NOTE — Patient Instructions (Signed)

## 2017-04-11 NOTE — Telephone Encounter (Signed)
Appointments made per 2/18 los / AVS/Calendar printed and given to patient.

## 2017-04-12 ENCOUNTER — Telehealth: Payer: Self-pay | Admitting: Pharmacist

## 2017-04-12 ENCOUNTER — Telehealth: Payer: Self-pay | Admitting: Pharmacy Technician

## 2017-04-12 DIAGNOSIS — C188 Malignant neoplasm of overlapping sites of colon: Secondary | ICD-10-CM

## 2017-04-12 MED ORDER — CAPECITABINE 500 MG PO TABS: ORAL_TABLET | ORAL | 2 refills | Status: DC

## 2017-04-12 NOTE — Telephone Encounter (Signed)
Oral Chemotherapy Pharmacist Encounter   Attempted to reach patient to provide update and offer for initial counseling on oral medication: Xeloda.  No answer. Left VM for patient to call back with any questions or issues.   Thank you,  Johny Drilling, PharmD, BCPS, BCOP 04/12/2017   2:36 PM Oral Oncology Clinic [phone]

## 2017-04-12 NOTE — Telephone Encounter (Signed)
Oral Oncology Patient Advocate Encounter  Received notification from [place] that prior authorization for Xeloda is required.  PA submitted on CoverMyMeds Key [id number] Status is pending  Oral Oncology Clinic will continue to follow.  Fabio Asa. Melynda Keller, [place] Patient [place] [phone] 04/12/2017 8:50 AM

## 2017-04-12 NOTE — Telephone Encounter (Signed)
Oral Oncology Patient Advocate Encounter  Prior Authorization for Xeloda has been approved.    Effective dates: 04/12/2017 through 04/12/2018  Oral Oncology Clinic will continue to follow.   Fabio Asa. Melynda Keller, [place] Patient [place] [phone] 04/12/2017 1:36 PM

## 2017-04-12 NOTE — Telephone Encounter (Signed)
Oral Oncology Pharmacist Encounter  Received new prescription for Xeloda (capecitabine) for the treatment of possibly metastatic colon cancer, planned duration 3 months.  Labs from 04/11/17 assessed, OK for treatment. Noted pltc=98k, will continue to be monitored. SCr=0.94, rounded to 1.0 for age>65, est CrCl~45 mL/min  Current medication list in Epic reviewed, no significant DDIs with Xeloda identified.  Prescription has been e-scribed to the [facility] for benefits analysis and approval. Prior authorization has been approved. Test claim reveled copayment $81.95 per each 21 day supply  Oral Oncology Clinic will continue to follow for initial counseling and start date.  Johny Drilling, PharmD, BCPS, BCOP 04/12/2017 1:45 PM Oral Oncology Clinic [phone]

## 2017-04-13 ENCOUNTER — Inpatient Hospital Stay: Payer: Medicare HMO

## 2017-04-13 VITALS — BP 141/68 | HR 85 | Temp 98.4°F | Resp 20

## 2017-04-13 DIAGNOSIS — C787 Secondary malignant neoplasm of liver and intrahepatic bile duct: Secondary | ICD-10-CM | POA: Diagnosis not present

## 2017-04-13 DIAGNOSIS — C188 Malignant neoplasm of overlapping sites of colon: Secondary | ICD-10-CM | POA: Diagnosis not present

## 2017-04-13 DIAGNOSIS — Z5111 Encounter for antineoplastic chemotherapy: Secondary | ICD-10-CM | POA: Diagnosis not present

## 2017-04-13 MED ORDER — HEPARIN SOD (PORK) LOCK FLUSH 100 UNIT/ML IV SOLN
500.0000 [IU] | Freq: Once | INTRAVENOUS | Status: AC | PRN
  Administered 2017-04-13: 500 [IU]
  Filled 2017-04-13: qty 5

## 2017-04-13 MED ORDER — SODIUM CHLORIDE 0.9% FLUSH
10.0000 mL | INTRAVENOUS | Status: DC | PRN
  Administered 2017-04-13: 10 mL
  Filled 2017-04-13: qty 10

## 2017-04-13 NOTE — Patient Instructions (Signed)
Implanted Port Home Guide An implanted port is a type of central line that is placed under the skin. Central lines are used to provide IV access when treatment or nutrition needs to be given through a person's veins. Implanted ports are used for long-term IV access. An implanted port may be placed because:  You need IV medicine that would be irritating to the small veins in your hands or arms.  You need long-term IV medicines, such as antibiotics.  You need IV nutrition for a long period.  You need frequent blood draws for lab tests.  You need dialysis.  Implanted ports are usually placed in the chest area, but they can also be placed in the upper arm, the abdomen, or the leg. An implanted port has two main parts:  Reservoir. The reservoir is round and will appear as a small, raised area under your skin. The reservoir is the part where a needle is inserted to give medicines or draw blood.  Catheter. The catheter is a thin, flexible tube that extends from the reservoir. The catheter is placed into a large vein. Medicine that is inserted into the reservoir goes into the catheter and then into the vein.  How will I care for my incision site? Do not get the incision site wet. Bathe or shower as directed by your health care provider. How is my port accessed? Special steps must be taken to access the port:  Before the port is accessed, a numbing cream can be placed on the skin. This helps numb the skin over the port site.  Your health care provider uses a sterile technique to access the port. ? Your health care provider must put on a mask and sterile gloves. ? The skin over your port is cleaned carefully with an antiseptic and allowed to dry. ? The port is gently pinched between sterile gloves, and a needle is inserted into the port.  Only "non-coring" port needles should be used to access the port. Once the port is accessed, a blood return should be checked. This helps ensure that the port  is in the vein and is not clogged.  If your port needs to remain accessed for a constant infusion, a clear (transparent) bandage will be placed over the needle site. The bandage and needle will need to be changed every week, or as directed by your health care provider.  Keep the bandage covering the needle clean and dry. Do not get it wet. Follow your health care provider's instructions on how to take a shower or bath while the port is accessed.  If your port does not need to stay accessed, no bandage is needed over the port.  What is flushing? Flushing helps keep the port from getting clogged. Follow your health care provider's instructions on how and when to flush the port. Ports are usually flushed with saline solution or a medicine called heparin. The need for flushing will depend on how the port is used.  If the port is used for intermittent medicines or blood draws, the port will need to be flushed: ? After medicines have been given. ? After blood has been drawn. ? As part of routine maintenance.  If a constant infusion is running, the port may not need to be flushed.  How long will my port stay implanted? The port can stay in for as long as your health care provider thinks it is needed. When it is time for the port to come out, surgery will be   done to remove it. The procedure is similar to the one performed when the port was put in. When should I seek immediate medical care? When you have an implanted port, you should seek immediate medical care if:  You notice a bad smell coming from the incision site.  You have swelling, redness, or drainage at the incision site.  You have more swelling or pain at the port site or the surrounding area.  You have a fever that is not controlled with medicine.  This information is not intended to replace advice given to you by your health care provider. Make sure you discuss any questions you have with your health care provider. Document  Released: 02/08/2005 Document Revised: 07/17/2015 Document Reviewed: 10/16/2012 Elsevier Interactive Patient Education  2017 [employer]  

## 2017-04-13 NOTE — Telephone Encounter (Signed)
Oral Chemotherapy Pharmacist Encounter   I spoke with patient when at [facility] for pump d/c for overview of: Xeloda (capecitabine).   Counseled patient on administration, dosing, side effects, monitoring, drug-food interactions, safe handling, storage, and disposal.  Patient will take Xeloda 500mg  tablets, 4 tablets (2000mg ) by mouth in AM and 3 tabs (1500mg ) by mouth in PM, within 30 minutes of finishing meals, on days 1-14 of each 21 day cycle.   Patient states she does not have much of an appetite but does have small meals regularly throughout the day.  Xeloda start date: 04/25/17  Side effects of Xeloda include but not limited to: fatigue, decreased blood counts, GI upset, diarrhea, and hand-foot syndrome. Patient has loperamide at home and will call the office if diarrhea develops.    Reviewed with patient importance of keeping a medication schedule and plan for any missed doses.  Ms. Jergens voiced understanding and appreciation.   All questions answered. Medication reconciliation performed and medication/allergy list updated.  I have reached out to financial advocate for possibe copayment assistance through [place] as there are no copayment grants available for patient's diagnosis at this time.   Patient knows to call the office with questions or concerns. Oral Oncology Clinic will continue to follow.  Thank you,  Johny Drilling, PharmD, BCPS, BCOP 04/13/2017   1:05 PM Oral Oncology Clinic [phone]

## 2017-04-14 ENCOUNTER — Encounter: Payer: Self-pay | Admitting: Hematology

## 2017-04-14 NOTE — Progress Notes (Signed)
Patient called to inquire about assistance with copay for Xeloda. Asked patient what her household gross monthly amount is and she told me. Asked patient to bring proof of income on 04/25/17 to apply for [place]. Patient approved based on verbal. Advised patient, I will place note in for her to see me on 04/25/17. She has my name and number for any additional financial questions or concerns.

## 2017-04-15 ENCOUNTER — Telehealth: Payer: Self-pay | Admitting: Gastroenterology

## 2017-04-15 NOTE — Telephone Encounter (Signed)
The pt wanted to know if she needs to continue chemo pills that Dr Burr Medico gave her.  I advised her that any questions regarding her chemo treatment she should call Dr Burr Medico.  The pt agreed and will call Dr Burr Medico.

## 2017-04-25 ENCOUNTER — Ambulatory Visit: Payer: Self-pay | Admitting: Genetics

## 2017-04-25 ENCOUNTER — Inpatient Hospital Stay: Payer: Medicare HMO

## 2017-04-25 ENCOUNTER — Ambulatory Visit: Payer: Medicare HMO

## 2017-04-25 ENCOUNTER — Telehealth: Payer: Self-pay | Admitting: Hematology

## 2017-04-25 ENCOUNTER — Inpatient Hospital Stay (HOSPITAL_BASED_OUTPATIENT_CLINIC_OR_DEPARTMENT_OTHER): Payer: Medicare HMO | Admitting: Hematology

## 2017-04-25 ENCOUNTER — Other Ambulatory Visit: Payer: Medicare HMO

## 2017-04-25 ENCOUNTER — Ambulatory Visit: Payer: Medicare HMO | Admitting: Nurse Practitioner

## 2017-04-25 ENCOUNTER — Encounter: Payer: Self-pay | Admitting: Hematology

## 2017-04-25 ENCOUNTER — Inpatient Hospital Stay: Payer: Medicare HMO | Attending: Nurse Practitioner

## 2017-04-25 VITALS — BP 135/72 | HR 82 | Temp 97.7°F | Resp 18 | Ht 64.0 in | Wt 151.6 lb

## 2017-04-25 DIAGNOSIS — E876 Hypokalemia: Secondary | ICD-10-CM | POA: Diagnosis not present

## 2017-04-25 DIAGNOSIS — C188 Malignant neoplasm of overlapping sites of colon: Secondary | ICD-10-CM

## 2017-04-25 DIAGNOSIS — D509 Iron deficiency anemia, unspecified: Secondary | ICD-10-CM

## 2017-04-25 DIAGNOSIS — D696 Thrombocytopenia, unspecified: Secondary | ICD-10-CM | POA: Insufficient documentation

## 2017-04-25 DIAGNOSIS — C787 Secondary malignant neoplasm of liver and intrahepatic bile duct: Secondary | ICD-10-CM

## 2017-04-25 DIAGNOSIS — I10 Essential (primary) hypertension: Secondary | ICD-10-CM

## 2017-04-25 DIAGNOSIS — M818 Other osteoporosis without current pathological fracture: Secondary | ICD-10-CM | POA: Diagnosis not present

## 2017-04-25 DIAGNOSIS — I81 Portal vein thrombosis: Secondary | ICD-10-CM | POA: Insufficient documentation

## 2017-04-25 DIAGNOSIS — D500 Iron deficiency anemia secondary to blood loss (chronic): Secondary | ICD-10-CM

## 2017-04-25 DIAGNOSIS — C189 Malignant neoplasm of colon, unspecified: Secondary | ICD-10-CM

## 2017-04-25 DIAGNOSIS — Z1379 Encounter for other screening for genetic and chromosomal anomalies: Secondary | ICD-10-CM

## 2017-04-25 LAB — COMPREHENSIVE METABOLIC PANEL
ALT: 35 U/L (ref 0–55)
AST: 25 U/L (ref 5–34)
Albumin: 3.4 g/dL — ABNORMAL LOW (ref 3.5–5.0)
Alkaline Phosphatase: 100 U/L (ref 40–150)
Anion gap: 9 (ref 3–11)
BUN: 17 mg/dL (ref 7–26)
CO2: 25 mmol/L (ref 22–29)
Calcium: 9.7 mg/dL (ref 8.4–10.4)
Chloride: 107 mmol/L (ref 98–109)
Creatinine, Ser: 0.94 mg/dL (ref 0.60–1.10)
GFR calc Af Amer: >60 mL/min (ref >60)
GFR calc non Af Amer: 57 mL/min — ABNORMAL LOW (ref >60)
Glucose, Bld: 107 mg/dL (ref 70–140)
Potassium: 3.6 mmol/L (ref 3.5–5.1)
Sodium: 141 mmol/L (ref 136–145)
Total Bilirubin: 0.4 mg/dL (ref 0.2–1.2)
Total Protein: 6.6 g/dL (ref 6.4–8.3)

## 2017-04-25 LAB — CBC WITH DIFFERENTIAL (CANCER CENTER ONLY)
Basophils Absolute: 0 10*3/uL (ref 0.0–0.1)
Basophils Relative: 0 %
Eosinophils Absolute: 0.1 10*3/uL (ref 0.0–0.5)
Eosinophils Relative: 1 %
HCT: 33.5 % — ABNORMAL LOW (ref 34.8–46.6)
Hemoglobin: 11.2 g/dL — ABNORMAL LOW (ref 11.6–15.9)
Lymphocytes Relative: 34 %
Lymphs Abs: 2.3 10*3/uL (ref 0.9–3.3)
MCH: 30.5 pg (ref 25.1–34.0)
MCHC: 33.4 g/dL (ref 31.5–36.0)
MCV: 91.3 fL (ref 79.5–101.0)
Monocytes Absolute: 1 10*3/uL — ABNORMAL HIGH (ref 0.1–0.9)
Monocytes Relative: 15 %
Neutro Abs: 3.3 10*3/uL (ref 1.5–6.5)
Neutrophils Relative %: 50 %
Platelet Count: 139 10*3/uL — ABNORMAL LOW (ref 145–400)
RBC: 3.67 MIL/uL — ABNORMAL LOW (ref 3.70–5.45)
RDW: 19.2 % — ABNORMAL HIGH (ref 11.2–14.5)
WBC Count: 6.6 10*3/uL (ref 3.9–10.3)

## 2017-04-25 LAB — RETICULOCYTES
RBC.: 3.67 MIL/uL — ABNORMAL LOW (ref 3.70–5.45)
Retic Count, Absolute: 95.4 10*3/uL — ABNORMAL HIGH (ref 33.7–90.7)
Retic Ct Pct: 2.6 % — ABNORMAL HIGH (ref 0.7–2.1)

## 2017-04-25 LAB — FERRITIN: Ferritin: 224 ng/mL (ref 9–269)

## 2017-04-25 MED ORDER — HEPARIN SOD (PORK) LOCK FLUSH 100 UNIT/ML IV SOLN
500.0000 [IU] | Freq: Once | INTRAVENOUS | Status: AC | PRN
  Administered 2017-04-25: 500 [IU]
  Filled 2017-04-25: qty 5

## 2017-04-25 MED ORDER — SODIUM CHLORIDE 0.9% FLUSH
10.0000 mL | Freq: Once | INTRAVENOUS | Status: AC
  Administered 2017-04-25: 10 mL
  Filled 2017-04-25: qty 10

## 2017-04-25 NOTE — Telephone Encounter (Signed)
Appointments scheduled AVS/Calendar printed per 3/4 los.  Also per note hand written note from Dr. Burr Medico I took patient over to see [facility].

## 2017-04-25 NOTE — Progress Notes (Signed)
HPI: Ms. Cormier was previously seen in the [place] clinic on 02/23/2017 due to a personal history of MSI High, IHC abnormal colon cancer, and concerns regarding a hereditary predisposition to cancer. Please refer to our prior cancer genetics clinic note for more information regarding Ms. Thurlow's medical, social and family histories, and our assessment and recommendations, at the time. Ms. Harlin recent genetic test results were disclosed to her, as well as recommendations warranted by these results. These results and recommendations are discussed in more detail below.  CANCER HISTORY:    Cancer of overlapping sites of colon ([place])   08/31/2016 Tumor Marker    Patient's tumor was tested for the following markers: CEA. Results of the tumor marker test revealed 2.0.      08/31/2016 Pathology Results    Colon biopsy Diagnosis: 1. Surgical [P], hepatic flexure. ADENOCARCINOMA. Moderately differentiated. 2. Surgical [P], transverse. ADENOCARCINOMA, Moderately differentiated.      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  - 2) Clearly malignant mass in the distal transverse colon; 4cm, firm, 1/2 cirumference, ulcerated. ' - 3) Four small (3-23m) scattered typical appearing adenomas throughout the colon, notremoved since she mistakenly stayed on her plavix for this procedure.      09/01/2016 Imaging    CT Abdomen Pelvis W Contrast IMPRESSION: There are 2 colonic lesions involving the proximal ascending colon and proximal transverse colon, as detailed above, highly concerning for colonic neoplasm. In addition, there are at least 2 indeterminate liver lesions which are suspicious for potential metastatic disease.       09/16/2016 Imaging    Mr Liver W Wo Contrast IMPRESSION: Lesion within central right lobe of liver exhibits of peripheral  enhancement and is suspicious for liver metastasis. Lesion within caudate lobe of liver identified on recent CT does not have a corresponding signal or enhancement abnormality on today's study. Within the inferior right lobe of liver there is an enhancing structure which is favored to represent an atypical benign hemangioma with liver metastasis felt less likely. The transverse colon lesion is again identified compatible with colonic adenocarcinoma.      10/04/2016 PET scan    PET 10/04/16 IMPRESSION: 1. Highly hypermetabolic proximal transverse colon mass, maximum SUV 30.8. Small focus of hypermetabolic activity in the mesentery just above this mass probably represents a local involved lymph node. 2. Moderately hypermetabolic ascending colon mass, maximum SUV 8.7. 3. Highly hypermetabolic enlarged portacaval lymph node, maximum SUV 16.7. 4. Focus of hypermetabolic activity near the duodenum bulb could be physiologic or due to an adjacent lymph node. 5. None of the liver lesions are discernibly hypermetabolic. Particularly the more cephalad right hepatic lobe lesion merits surveillance, it had nonspecific enhancement characteristics on prior cross-sectional imaging and only a thin enhancing wall which could conceivably predispose to false negative due to central necrosis. 6. Several tiny chronic pulmonary nodules are likely benign. 7. Other imaging findings of potential clinical significance: Aortic Atherosclerosis (ICD10-I70.0). Coronary atherosclerosis. Bilateral nonobstructive nephrolithiasis.      11/10/2016 Surgery    LAPAROSCOPIC EXTENDED RIGHT COLECTOMY by Dr. [name]      11/10/2016 Pathology Results    Diagnosis 11/10/16 Colon, segmental resection for tumor, Ascending and Transverse ADENOCARCINOMA OF THE ASCENDING COLON WITH EXTRA CELLULAR MUCIN, GRADE 3, SIZE 3.4 CM THE TUMOR INVADES MUSCULARIS PROPRIA POORLY DIFFERENTIATED ADENOCARCINOMA  OF THE TRANSVERSE COLON, GRADE 4, SIZE  6.1 CM THE TUMOR INVADES THROUGH MUSCULARIS PROPRIA INTO PERICOLONIC SOFT TISSUE EXTRAMURAL SATELLITE TUMOR NODULES IS IDENTIFIED (X1) ALL MARGINS OF RESECTION ARE NEGATIVE FOR CARCINOMA METASTATIC CARCINOMA IN ONE OF SIXTY LYMPH NODES (1/60) TABULAR ADENOMA X1      12/28/2016 Imaging    MRI abdomen w/wo contrast IMPRESSION: Motion degraded images.  Two indeterminate right liver lesions measuring up to 1.6 cm, as described above. Continued attention on follow-up is suggested.  Two periportal lymph nodes measuring up to 1.9 cm, suspicious for nodal metastases.  Postsurgical changes related to right colon resection.       12/29/2016 -  Chemotherapy    FOLFOX every 2 weeks starting 12/29/16       02/03/2017 Procedure    EUS Per [name] Endoscopic Finding Findings: The examined esophagus was endoscopically normal. The entire examined stomach was endoscopically normal. The examined duodenum was endoscopically normal. Endosonographic Finding (limited evaluation for tissue acquisition): 1. One enlarged lymph node (vs soft tissue mass) was visualized in the porta hepatis region. It measured 16 mm in maximal cross-sectional diameter. The node (vs soft tissue) was oval, isoechoic and had poorly defined margins. Fine needle aspiration for cytology was performed. Color Doppler imaging was utilized prior to needle puncture to confirm a lack of significant vascular structures within the needle path. Threepasses were made with the 25 gauge needle using a transgastric approach. 2. CBD was normal, non-dilated 3. Limited views of the liver, spleen, pancreas were all normal.  Impression: One periportal soft tissue mass (lymphnode?) measuring 57m was noted and sampled with FNA.      02/03/2017 Pathology Results    Diagnosis FINE NEEDLE ASPIRATION, ENDOSCOPIC, PERI PORTAL NODE (SPECIMEN 1 OF 1 COLLECTED 02/03/17): NO MALIGNANT CELLS IDENTIFIED. SCANT LYMPHOID AND BENIGN GLANDULAR  ELEMENTS.      03/09/2017 Imaging    IMPRESSION: Interval decrease in several tiny low-attenuation liver lesions the, consistent with improving hepatic metastases.  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         FAMILY HISTORY:  We obtained a detailed, 4-generation family history.  Significant diagnoses are listed below: Family History  Problem Relation Age of Onset  . Alcohol abuse Mother   . Heart attack Mother   . GER disease Mother   . GER disease Father   . Alcohol abuse Father   . Stroke Father   . Tuberculosis Father   . Heart attack Father   . Aneurysm Father   . Mental illness Sister   . Depression Sister   . Breast cancer Neg Hx     Ms. GHaroonhas 2 daughters and 2 sons in their 549's/60'swith no known history of cancer (1 son she gave up for adoption and does not know his history).  Ms. GBristolhas a sister who she is no longer in contact with and does not know her health history.  He has a brother who died at 735due to arterial disease.  Ms. GSpaidhas a maternal half-sister (this sister's father is her paternal uncle) who died at 253due to trauma.   Ms. GRanneyfather died at 946with no history of cancer.  Ms. GCastellhas 1 paternal uncle who died at 651due to unknown causes - possibly cancer.  This uncle is the father of her maternal half-sister.   Ms. GMarkiewiczpaternal grandfather died in his older age- unk cause.  Ms.  Whidby does not know the health information about her paternal grandmother.    Ms. Beshears mother died at 57 due to strokes and a heart attack.  Ms. Reisen has 2 maternal uncles with no history of cancer and no children.  They lived to old age.  Ms. Campoy maternal grandparents lived to old age with no known history of cancer.   Ms. Krizek is unaware of previous family history of genetic testing for hereditary cancer risks. Patient's maternal ancestors are  of Bohemian/Caucaisan descent, and paternal ancestors are of English descent. There is no reported Ashkenazi Jewish ancestry. There is no known consanguinity.  GENETIC TEST RESULTS: Genetic testing performed through [facility]  reported out on 04/07/2017 showed no germline pathogenic variants.  TumorNext Lynch with CancerNext: Paired Germline and Tumor Analyses for EnhancedDiagnosis of Lynch Syndrome plus Analyses of 29 Additional Genes Associated with Hereditary Cancer.  Germline Genes analyzed: MLH1, MSH2, MSH6, PMS2, APC, ATM, BARD1, BMPR1A, BRIP1, CDH1, CDKN2A, CHEK2, DICER1, MRE11A, MUTYH, NBN, PALB2, PTEN, RAD50, RAD51C, RAD51D, SMAD4, STK11, TP53, CDK4, NF1, BRCA1, BRCA2, POLD1, POLE, SMARCA4, HOXB13 (sequencing and deletion/duplication); EPCAM, GREM1 (deletion/duplication only).    Somatic/Tumor test results:      The test report will be scanned into EPIC and will be located under the Molecular Pathology section of the Results Review tab.A portion of the result report is included below for reference.   We discussed with Ms. Gilchrest that because current genetic testing is not perfect, it is possible there may be a gene mutation in one of these genes that current testing cannot detect, but that chance is small. We also discussed, that there could be another gene that has not yet been discovered, or that we have not yet tested, that is responsible for the cancer diagnoses in the family. It is also possible there is a hereditary cause for the cancer in the family that Ms. Larkin did not inherit and therefore was not identified in her testing.  Therefore, it is important to remain in touch with cancer genetics in the future so that we can continue to offer Ms. Tinch the most up to date genetic testing.   ADDITIONAL GENETIC TESTING: We discussed with Ms. Mccleod that there are other genes that are associated with increased cancer risk that can be analyzed. The  laboratories that offer this testing look at these additional genes via a hereditary cancer gene panel. Should Ms. Savarino wish to pursue additional genetic testing, we are happy to discuss and coordinate this testing, at any time.    CANCER SCREENING RECOMMENDATIONS:  This normal germline result indicates that it is unlikely Ms. Kook has an increased risk for a future cancer due to a mutation in one of these genes. This normal test also suggests that Ms. Lince's cancer was most likely not due to an inherited predisposition associated with one of these genes.  Most cancers happen by chance and this negative test suggests that her cancer may fall into this category.  However, genetic testing is not perfect and cannot definitively rule out a hereditary cause for her cancer.   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    Therefore, it is recommended she continue to follow the cancer management and screening guidelines provided by her oncology and primary healthcare provider. Other factors such as her personal and family history may still affect her cancer risk.  RECOMMENDATIONS FOR FAMILY MEMBERS: Individuals in this family might  be at some increased risk of developing cancer, over the general population risk, simply due to the family history of cancer. We recommended women in this family have a yearly mammogram beginning at age 65, or 52 years younger than the earliest onset of cancer, an annual clinical breast exam, and perform monthly breast self-exams. Women in this family should also have a gynecological exam as recommended by their primary provider. All family members should have a colonoscopy by age 60 or earlier based on family history. We recommend her children and siblings have colonoscopies every 5 years starting by age 47.   All family members should inform their physicians about the family history of cancer so their doctors  can make the most appropriate screening recommendations for them.   FOLLOW-UP: Lastly, we discussed with Ms. Scaglione that cancer genetics is a rapidly advancing field and it is possible that new genetic tests will be appropriate for her and/or her family members in the future. We encouraged her to remain in contact with cancer genetics on an annual basis so we can update her personal and family histories and let her know of advances in cancer genetics that may benefit this family.   Our contact number was provided. Ms. Layson questions were answered to her satisfaction, and she knows she is welcome to call us at anytime with additional questions or concerns.   Ferol Luz, MS, [facility] Certified Genetic Counselor [email or web address]

## 2017-04-25 NOTE — Progress Notes (Signed)
[place]  [phone] [phone]  Clinic Follow-up Note   Patient Care Team: Tower, [name], MD as PCP - General [name], MD as Consulting Physician (Hematology) [name], MD as Attending Physician (Gastroenterology) [name], MD as Consulting Physician (General Surgery)   Date of Service:  04/25/2017   CHIEF COMPLAINTS:  Follow up for Colorectal adenocarcinoma, moderately differentiated    Cancer of overlapping sites of colon [facility])   08/31/2016 Tumor Marker    Patient's tumor was tested for the following markers: CEA. Results of the tumor marker test revealed 2.0.      08/31/2016 Pathology Results    Colon biopsy Diagnosis: 1. Surgical [P], hepatic flexure. ADENOCARCINOMA. Moderately differentiated. 2. Surgical [P], transverse. ADENOCARCINOMA, Moderately differentiated.      08/31/2016 Initial Diagnosis    Colon cancer metastasized to liver ([place])      08/31/2016 Procedure    - 1) Polypoid mass at the hepatic flexure, 2cm, soft but clearly neoplastic.  - 2) Clearly malignant mass in the distal transverse colon; 4cm, firm, 1/2 cirumference, ulcerated. ' - 3) Four small (3-75m) scattered typical appearing adenomas throughout the colon, notremoved since she mistakenly stayed on her plavix for this procedure.      09/01/2016 Imaging    CT Abdomen Pelvis W Contrast IMPRESSION: There are 2 colonic lesions involving the proximal ascending colon and proximal transverse colon, as detailed above, highly concerning for colonic neoplasm. In addition, there are at least 2 indeterminate liver lesions which are suspicious for potential metastatic disease.       09/16/2016 Imaging    Mr Liver W Wo Contrast IMPRESSION: Lesion within central right lobe of liver exhibits of peripheral enhancement and is suspicious for liver metastasis. Lesion within caudate lobe of liver identified on recent CT does not have a corresponding signal or enhancement  abnormality on today's study. Within the inferior right lobe of liver there is an enhancing structure which is favored to represent an atypical benign hemangioma with liver metastasis felt less likely. The transverse colon lesion is again identified compatible with colonic adenocarcinoma.      10/04/2016 PET scan    PET 10/04/16 IMPRESSION: 1. Highly hypermetabolic proximal transverse colon mass, maximum SUV 30.8. Small focus of hypermetabolic activity in the mesentery just above this mass probably represents a local involved lymph node. 2. Moderately hypermetabolic ascending colon mass, maximum SUV 8.7. 3. Highly hypermetabolic enlarged portacaval lymph node, maximum SUV 16.7. 4. Focus of hypermetabolic activity near the duodenum bulb could be physiologic or due to an adjacent lymph node. 5. None of the liver lesions are discernibly hypermetabolic. Particularly the more cephalad right hepatic lobe lesion merits surveillance, it had nonspecific enhancement characteristics on prior cross-sectional imaging and only a thin enhancing wall which could conceivably predispose to false negative due to central necrosis. 6. Several tiny chronic pulmonary nodules are likely benign. 7. Other imaging findings of potential clinical significance: Aortic Atherosclerosis (ICD10-I70.0). Coronary atherosclerosis. Bilateral nonobstructive nephrolithiasis.      11/10/2016 Surgery    LAPAROSCOPIC EXTENDED RIGHT COLECTOMY by Dr. [name]      11/10/2016 Pathology Results    Diagnosis 11/10/16 Colon, segmental resection for tumor, Ascending and Transverse ADENOCARCINOMA OF THE ASCENDING COLON WITH EXTRA CELLULAR MUCIN, GRADE 3, SIZE 3.4 CM THE TUMOR INVADES MUSCULARIS PROPRIA POORLY DIFFERENTIATED ADENOCARCINOMA OF THE TRANSVERSE COLON, GRADE 4, SIZE 6.1 CM THE TUMOR INVADES THROUGH MUSCULARIS PROPRIA INTO PERICOLONIC SOFT TISSUE EXTRAMURAL SATELLITE TUMOR NODULES IS IDENTIFIED (X1)  ALL MARGINS OF RESECTION  ARE NEGATIVE FOR CARCINOMA METASTATIC CARCINOMA IN ONE OF SIXTY LYMPH NODES (1/60) TABULAR ADENOMA X1      12/28/2016 Imaging    MRI abdomen w/wo contrast IMPRESSION: Motion degraded images.  Two indeterminate right liver lesions measuring up to 1.6 cm, as described above. Continued attention on follow-up is suggested.  Two periportal lymph nodes measuring up to 1.9 cm, suspicious for nodal metastases.  Postsurgical changes related to right colon resection.       12/29/2016 - 04/11/2017 Chemotherapy    FOLFOX every 2 weeks starting 12/29/16 last cycle 7 on 04/11/17.       02/03/2017 Procedure    EUS Per [name] Endoscopic Finding Findings: The examined esophagus was endoscopically normal. The entire examined stomach was endoscopically normal. The examined duodenum was endoscopically normal. Endosonographic Finding (limited evaluation for tissue acquisition): 1. One enlarged lymph node (vs soft tissue mass) was visualized in the porta hepatis region. It measured 16 mm in maximal cross-sectional diameter. The node (vs soft tissue) was oval, isoechoic and had poorly defined margins. Fine needle aspiration for cytology was performed. Color Doppler imaging was utilized prior to needle puncture to confirm a lack of significant vascular structures within the needle path. Threepasses were made with the 25 gauge needle using a transgastric approach. 2. CBD was normal, non-dilated 3. Limited views of the liver, spleen, pancreas were all normal.  Impression: One periportal soft tissue mass (lymphnode?) measuring 79m was noted and sampled with FNA.      02/03/2017 Pathology Results    Diagnosis FINE NEEDLE ASPIRATION, ENDOSCOPIC, PERI PORTAL NODE (SPECIMEN 1 OF 1 COLLECTED 02/03/17): NO MALIGNANT CELLS IDENTIFIED. SCANT LYMPHOID AND BENIGN GLANDULAR ELEMENTS.      03/09/2017 Imaging    IMPRESSION: Interval decrease in several tiny low-attenuation liver lesions the, consistent  with improving hepatic metastases.  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  TomorNext Lynch+ cancer Next was ordered throught he laboratory [facility]  TumorNext Lynch with CancerNext: Paired Germline and Tumor Analyses for EnhancedDiagnosis of Lynch Syndrome plus Analyses of 29 Additional Genes Associated with Hereditary Cancer.  Germline Genes analyzed: MLH1, MSH2, MSH6, PMS2, APC, ATM, BARD1, BMPR1A, BRIP1, CDH1, CDKN2A, CHEK2, DICER1, MRE11A, MUTYH, NBN, PALB2, PTEN, RAD50, RAD51C, RAD51D, SMAD4, STK11, TP53, CDK4, NF1, BRCA1, BRCA2, POLD1, POLE, SMARCA4, HOXB13 (sequencing and deletion/duplication); EPCAM, GREM1 (deletion/duplication only).  Results: Germline- Negative, no variants detected.  Tumor/Somatic-  BRAF V600E detected, No variants in KRAS or NRAS detected.  MLH1 promoter hypermethylation present, Microsatellite High.   Somatic pathogenic mutations in MSH2 c.687delA and pathogenic mutation in MSH6 c.3261dupC.  2 Somatic Variants of uncertain significance in MSH2 called p.A123T and p.D654N.   The date of this test report is 04/07/2017.         Chemotherapy    PENDING Xeloda 4 tabs in the AM and 3 tabs in the PM        HISTORY OF PRESENTING ILLNESS: 09/23/16 [name]89y.o. female is here because of Colorectal adenocarcinoma, moderately differentiated  Referred by Gastroenterologist, Dr. DOwens Lofflerfor abnormal colon biopsy. Accompanied by family friend.   Pt initially presented with one episode of rectal bleeding one year ago, but did not pursue further workup  until lately, she was referred to Gastroenterologist, [name] on 07/13/16 for Routine colonoscopy. She was referred to him by [name] for repeat colonoscopy  for colon CA screening. Pt last colonoscopy was with [name] in August 2011 that was completed for hx of polyps and was significant for hemorrhoids and otherwise negative.   On 08/31/16, pt underwent screening colonoscopy, which unfortunately showed 2 masses in the proximal and distant transverse colon, biopsy both showed moderately differentiated adenocarcinoma.   Pt had a CT Abdomen Pelvis that was ordered by [name] and completed on 09/01/16 and significant for colonic neoplasm and 2 indeterminate liver lesions suspicious for potential metastatic dx.   Pt had a CT Chest W Constrast that was ordered by [name] and completed on 09/06/16 and significant for no metastasis.    Pt had MR Liver W WO Contrast that was ordered by [name] and completed on 09/16/16 and significant for colonic adenocarcinoma and indeterminate liver lesions.  Pt notes that she was referred to the office by [name]. Pt has an appointment with Surgeon, [name] tomorrow, 8/3 to discuss her options. She notes that [name] recommended a liver biopsy. Pt states that she had 1 year ago she had one large episode of rectal bleeding following consuming a milkshake, 1 episode of emesis, and a large episode of rectal bleeding that resolved on its own. Denies recurrent bleeding following initial rectal bleeding. Denies being evaluated last year for her rectal bleeding due to having issues with finding a GI specialist. Pt last colonoscopy was 7 years ago. Pt reports that due to her chronic constipation, she uses suppositories and OTC laxatives. She states that she eats mainly meat and starches due to her dislike for vegetables. She notes that she recently lost approximately 10 lbs over the past year. 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 She states that she lives with her husband and has 3 older children,  2 in [place] and 1 in [place]. Pt friend notes that the pt grandchildren intermittently help her out. She states that her husband has severe medical issues with internal bleeding and is currently on oxygen. Pt reports that she completes all of the housework and outside work due to her husband being unable to complete daily activities.   Pt has intermittent GERD that she used to take Nexium for. Denies PMHx of HTN, cardiac issues. Denies taking HTN medications. Denies MI, but notes that she was evaluated for CP symptoms and was found to have a 20% blockage in her arteries. Pt reports that she had a stroke with resolved residual right sided weakness summer 2017 that she was evaluated the next day at [facility] and was informed by her horse vet to be evaluated. Pt states that she took 2 ASA following her initial symptoms. Pt reports that she takes ASA and plavix for. She states that she stopped taking her plavix 2 weeks ago following the colonoscopy. Pt sates that she takes Rx Lipitor for cholesterol. Pt reports that she takes multi-vitamin and that [name] recommended that she takes vitamin-D.   Pt had cholecystectomy and partial hysterectomy with one ovary left. Pt denies family hx of CA. Denies smoking cigarettes, but reports that her husband used to smoke cigarettes. Pt denies ETOH use at this time.   On review of systems, pt denies fever, chills.  Endorses 10 lb weight loss, decreased energy levels. Denies pain. Pt reports constipation treated with laxatives. Pt reports that she has hemorrhoids. Pt denies abdominal pain, nausea, vomiting, abdominal distension.    CURRENT THERAPY: PENDING Xeloda 4 tabs in the AM and 3 tabs in the PM to start 04/26/17   INTERVAL HISTORY:  [name] is here for a follow up. She presents to the clinic today by herself. She notes feeling pressure in her head and feeling dizzy. This has effected her hearing. She has not received her Xeloda yet and no once has  contacted her about money to pay for it yet.   On review of symptoms, pt feels better since being off chemo. She notes to being hard of hearing. She denies any numbness in her hands and her toes are back to baseline. She had sores in her mouth and treated with poly-antibiotics and now it is resolved.   MEDICAL HISTORY:  Past Medical History:  Diagnosis Date  . Anemia   . Arthritis   . Astigmatism    both eyes  . Cancer [facility])    colon surgery done last chemo tx 01-17-17  . Colon cancer ([place])   . Coronary artery disease, non-occlusive   . Dyspnea    more so exertion  . Dysrhythmia    skipped beats every now and then  . GERD (gastroesophageal reflux disease)   . Glaucoma    both eyes right eye worse than left  . Hammer toes of both feet   . Headache    migraines with aura  . Heel spur    both heels and front  . HSV (herpes simplex virus) infection   . Hyperglycemia   . Hyperlipidemia   . IBS (irritable bowel syndrome)   . IC (interstitial cystitis)   . Insomnia disorder related to known organic factor   . Osteoporosis   . PONV (postoperative nausea and vomiting)    has had more surgeries, and had no problems  . Stroke ([place]) 2016  . Vitamin D deficiency     SURGICAL HISTORY: Past Surgical History:  Procedure Laterality Date  . ABDOMINAL HYSTERECTOMY     1 ovary and womb removed  . CARDIAC CATHETERIZATION  7/12   non obst dz -- not sure what yr  . CHOLECYSTECTOMY    . EUS N/A 02/03/2017   Procedure: UPPER ENDOSCOPIC ULTRASOUND (EUS) LINEAR;  Surgeon: [name], MD;  Location: [facility];  Service: Endoscopy;  Laterality: N/A;  . EYE SURGERY Bilateral    ioc for cataracts   . LAPAROSCOPIC RIGHT COLECTOMY Right 11/10/2016   Procedure: LAPAROSCOPIC EXTENDED RIGHT COLECTOMY;  Surgeon: [name], MD;  Location: [place];  Service: General;  Laterality: Right;  . PORTACATH PLACEMENT Left 12/22/2016   Procedure: INSERTION PORT-A-CATH;  Surgeon: [name], MD;   Location: [place];  Service: General;  Laterality: Left;  . ROTATOR CUFF REPAIR Bilateral     SOCIAL HISTORY: Social History   Socioeconomic History  . Marital status: Married    Spouse name: Not on file  . Number of children: 4  . Years of education: Not on file  . Highest education level: Not on file  Social Needs  . Financial resource strain: Not on file  . Food insecurity - worry: Not on file  . Food insecurity - inability: Not on file  . Transportation needs - medical: Not on file  . Transportation needs - non-medical: Not on file  Occupational  History  . Occupation: reitred    Employer: RETIRED  Tobacco Use  . Smoking status: Never Smoker  . Smokeless tobacco: Never Used  Substance and Sexual Activity  . Alcohol use: No    Alcohol/week: 0.0 oz  . Drug use: No  . Sexual activity: Not on file  Other Topics Concern  . Not on file  Social History Narrative   Rides horses         Specialty providers    [facility]   Cardio- [place]   Ortho--[place]   Rheum-[facility]   urol-[place]   [facility]-- [facility]    FAMILY HISTORY: Family History  Problem Relation Age of Onset  . Alcohol abuse Mother   . Heart attack Mother   . GER disease Mother   . GER disease Father   . Alcohol abuse Father   . Stroke Father   . Tuberculosis Father   . Heart attack Father   . Aneurysm Father   . Mental illness Sister   . Depression Sister   . Breast cancer Neg Hx     ALLERGIES:  is allergic to salmon [fish allergy]; ampicillin; codeine; flagyl [metronidazole]; meperidine hcl; neomycin; penicillins; propoxyphene hcl; sulfa antibiotics; and tramadol hcl.  MEDICATIONS:  Current Outpatient Medications  Medication Sig Dispense Refill  . apixaban (ELIQUIS) 5 MG TABS tablet Take 1 tablet (5 mg total) by mouth 2 (two) times daily. 60 tablet 2  . atorvastatin (LIPITOR) 40 MG tablet Take 1 tablet (40 mg total) by mouth daily at 6 PM. 90 tablet 3  . b complex vitamins  tablet Take 1 tablet by mouth daily.    . bimatoprost (LUMIGAN) 0.01 % SOLN Place 1 drop into both eyes daily.    . Ca Carbonate-Mag Hydroxide (ROLAIDS PO) Take 1-2 tablets by mouth every 8 (eight) hours as needed (for acid reflux).     . capecitabine (XELODA) 500 MG tablet Take 4 tabs (2074m) by mouth in AM & 3 tabs (15019m in PM, within 3071mafter food, for 2 weeks on, 1 week off, repeat every 3 weeks 98 tablet 2  . Cholecalciferol (VITAMIN D) 2000 units tablet Take 2,000 Units by mouth daily.    . COMBIGAN 0.2-0.5 % ophthalmic solution     . famotidine (PEPCID) 20 MG tablet Take 1 tablet (20 mg total) by mouth at bedtime. 60 tablet 0  . gabapentin (NEURONTIN) 100 MG capsule Take 1 capsule (100 mg total) by mouth 2 (two) times daily as needed. (Patient taking differently: Take 100 mg by mouth 2 (two) times daily as needed (pain). ) 30 capsule 1  . Ibuprofen-Diphenhydramine Cit (IBUPROFEN PM) 200-38 MG TABS Take 1 tablet by mouth daily.    . lMarland Kitchenctose free nutrition (BOOST) LIQD Take 237 mLs by mouth daily.    . lMarland Kitchendocaine-prilocaine (EMLA) cream Apply to affected area once 30 g 3  . Multiple Vitamin (MULTIVITAMIN WITH MINERALS) TABS tablet Take 1 tablet by mouth daily.    . ondansetron (ZOFRAN) 8 MG tablet Take 1 tablet (8 mg total) by mouth 2 (two) times daily as needed for refractory nausea / vomiting. Start on day 3 after chemotherapy. 30 tablet 1  . polyethylene glycol (MIRALAX / GLYCOLAX) packet Take 17 g by mouth daily. (Patient not taking: Reported on 04/11/2017) 14 each 0  . potassium chloride SA (K-DUR,KLOR-CON) 20 MEQ tablet Take 1 tablet (20 mEq total) by mouth daily. (Patient not taking: Reported on 04/11/2017) 30 tablet 1  . prochlorperazine (COMPAZINE) 10 MG tablet  Take 1 tablet (10 mg total) by mouth every 6 (six) hours as needed (Nausea or vomiting). 30 tablet 1  . triamcinolone (KENALOG) 0.1 % paste Use as directed 1 application in the mouth or throat 3 (three) times daily. On to  mouth ulcer areas 5 g 1   No current facility-administered medications for this visit.     REVIEW OF SYSTEMS:  Constitutional: Denies fevers, chills or abnormal night sweats   Eyes: Denies blurriness of vision, double vision or watery eyes Ears, nose, mouth, throat, and face: Denies mucositis or sore throat (+) hard of Hearing Respiratory: Denies cough, dyspnea or wheezes Cardiovascular: Denies palpitation, chest discomfort or lower extremity swelling Gastrointestinal:  Denies nausea, heartburn   Skin: Denies abnormal skin rashes Lymphatics: Denies new lymphadenopathy or easy bruising Neurological:Denies numbness, tingling or new weaknesses Behavioral/Psych: Mood is stable, no new changes  All other systems were reviewed with the patient and are negative.  PHYSICAL EXAMINATION: ECOG PERFORMANCE STATUS: 2  Vitals:   04/25/17 1340  BP: 135/72  Pulse: 82  Resp: 18  Temp: 97.7 F (36.5 C)  SpO2: 98%   Filed Weights   04/25/17 1340  Weight: 151 lb 9.6 oz (68.8 kg)     GENERAL:alert, no distress and comfortable SKIN: skin color, texture, turgor are normal, no rashes or significant lesions EYES: normal, conjunctiva are pink and non-injected, sclera clear OROPHARYNX:no exudate, no erythema and lips, buccal mucosa, and tongue normal  NECK: supple, thyroid normal size, non-tender, without nodularity LYMPH:  no palpable lymphadenopathy in the cervical, axillary or inguinal LUNGS: clear to auscultation and percussion with normal breathing effort HEART: regular rate & rhythm and no murmurs and no lower extremity edema ABDOMEN:abdomen soft, non-tender and normal bowel sounds. No organomegaly noted. (+) midline incision above umbilical , well healed, non-tender, no scar tissue.  Musculoskeletal:no cyanosis of digits and no clubbing  PSYCH: alert & oriented x 3 with fluent speech NEURO: no focal motor/sensory deficits   LABORATORY DATA:  I have reviewed the data as listed CBC  Latest Ref Rng & Units 04/25/2017 04/11/2017 03/28/2017  WBC 3.9 - 10.3 K/uL 6.6 4.1 3.5(L)  Hemoglobin 11.6 - 15.9 g/dL - - -  Hematocrit 34.8 - 46.6 % 33.5(L) 34.8 34.8  Platelets 145 - 400 K/uL 139(L) 98(L) 121(L)    CMP Latest Ref Rng & Units 04/25/2017 04/11/2017 03/28/2017  Glucose 70 - 140 mg/dL 107 108 110  BUN 7 - 26 mg/dL '17 14 18  ' Creatinine 0.60 - 1.10 mg/dL 0.94 0.94 0.82  Sodium 136 - 145 mmol/L 141 141 143  Potassium 3.5 - 5.1 mmol/L 3.6 3.8 3.3(L)  Chloride 98 - 109 mmol/L 107 107 108  CO2 22 - 29 mmol/L '25 26 25  ' Calcium 8.4 - 10.4 mg/dL 9.7 9.7 9.4  Total Protein 6.4 - 8.3 g/dL 6.6 6.6 6.6  Total Bilirubin 0.2 - 1.2 mg/dL 0.4 0.4 0.3  Alkaline Phos 40 - 150 U/L 100 111 95  AST 5 - 34 U/L 25 42(H) 32  ALT 0 - 55 U/L 35 54 27    CEA 08/31/16: 2.0   PATHOLOGY REPORT:  Diagnosis 11/10/16 Colon, segmental resection for tumor, Ascending and Transverse ADENOCARCINOMA OF THE ASCENDING COLON WITH EXTRA CELLULAR MUCIN, GRADE 3, SIZE 3.4 CM THE TUMOR INVADES MUSCULARIS PROPRIA POORLY DIFFERENTIATED ADENOCARCINOMA OF THE TRANSVERSE COLON, GRADE 4, SIZE 6.1 CM THE TUMOR INVADES THROUGH MUSCULARIS PROPRIA INTO PERICOLONIC SOFT TISSUE EXTRAMURAL SATELLITE TUMOR NODULES IS IDENTIFIED (X1) ALL MARGINS OF RESECTION  ARE NEGATIVE FOR CARCINOMA METASTATIC CARCINOMA IN ONE OF SIXTY LYMPH NODES (1/60) TABULAR ADENOMA X1 Microscopic Comment COLON AND RECTUM (INCLUDING TRANS-ANAL RESECTION): Specimen: Right and transverse colon Procedure: Segmental resection Tumor site: Ascending and transverse colon Specimen integrity: Intact Macroscopic intactness of mesorectum: Not applicable: X Complete: NA Near complete: NA Incomplete: NA Cannot be determined (specify): NA Macroscopic tumor perforation: Pericolonic soft tissue Invasive tumor: Maximum size: 6.1 cm and 3.4 cm Histologic type(s): Adenocarcinoma Histologic grade and differentiation: G1: well differentiated/low grade Microscopic  Comment(continued) G2: moderately differentiated/low grade G3: poorly differentiated/high grade G4: undifferentiated/high grade Type of polyp in which invasive carcinoma arose: Tubular adenoma Microscopic extension of invasive tumor: Pericolonic soft tissue Lymph-Vascular invasion: Identified Peri-neural invasion: Negative Tumor deposit(s) (discontinuous extramural extension): Present Resection margins: Proximal margin: Negative Distal margin: Negative Circumferential (radial) (posterior ascending, posterior descending; lateral and posterior mid-rectum; and entire lower 1/3 rectum): Negative Mesenteric margin (sigmoid and transverse): Negative Distance closest margin (if all above margins negative): 1.5 cm from the mesenteric margin Trans-anal resection margins only: Deep margin: NA Mucosal Margin: NA Distance closest mucosal margin (if negative): NA Treatment effect (neo-adjuvant therapy): Negative Additional polyp(s): Tubular adenoma Non-neoplastic findings: Unremarkble Lymph nodes: number examined 60; number positive: 1 Pathologic Staging: pT3 (m), pN1c, pMx Ancillary studies: Ordered    Colon biopsy, 08/31/16 Diagnosis 1. Surgical [P], hepatic flexure - ADENOCARCINOMA. Moderately differentiated.  2. Surgical [P], transverse - ADENOCARCINOMA, Moderately differentiated.  RADIOGRAPHIC STUDIES: I have personally reviewed the radiological images as listed and agreed with the findings in the report. No results found.  PROCEDURES   COLONOSCOPY 08/31/2016 DR. JACOBS  - 1) Polypoid mass at the hepatic flexure, 2cm, soft but clearly neoplastic. This was biopsied and the site was injected with Spot. - 2) Clearly malignant mass in the distal transverse colon; 4cm, firm, 1/2 cirumference, ulcerated. This was biopsied and the site was also injected with Spot. - 3) Four small (3-79m) scattered typical appearing adenomas throughout the colon, not removed since she mistakenly stayed  on her plavix for this procedure.  ASSESSMENT & PLAN:   VKelechi Astarita is a 77y.o. caucasian female with colorectal adenocarcinoma, moderately differentiated, found on screening colonoscopy.   1. Synchronized colon adenocarcinoma, moderately differentiated, in hepatic fracture and distal transverse colon, pT3(m)N1cMx, at least stage IIIB, MSI-H -I have reviewed her colonoscopy, biopsy and CT and MRI image findings with patient and her friend in great details. -She has to synchronized colon mass in the hepatic flexure and distal transverse colon, post biopsy showed moderately-differentiated adenocarcinoma -CT and abdominal MRI showed 2 lesions in right lobe of liver, at least one lesion is very suspicious for metastatic disease, and enlarged periportal lymph nodes.  --We discussed 10/04/16 PET. There is no definitive distant metastasis on the PET scan. There was no uptake in her liver, liver metastasis is less likely, although not ruled out. There is a hypermetabolic lymph node in the portacaval region measuring 1.5 cm with SUV 16.7, compatible with malignancy. --12/28/16 MRI of abdomen revealed no significant change in liver lesions, but noted slightly enlarged periportal lymphadenopathy from previous scan, suspicious for nodal metastasis. -I recommend EUS biopsy of the portacaval lymph node, which was negative  -She has been on chemotherapy FOLFOX, it has become more difficult for her continue treatment, due to side effects.  -She previously requested to stop IV chemo because of financial burden and because she wants to get back to her life and her horses. Since metastatic disease is not ruled out,  I recommend her to continue chemotherapy a little longer, for at least a total of 6 months. I suggested we change FOLFOX to Xeloda so she can stay at home more. I discussed the potential side effects of Xeloda, especially hand-and-foot syndrome, diarrhea, etc., she voiced good understanding and  agreed to proceed.  -Lab reviewed, adequate for treatment, I recommend her to start Xeloda this week.  Her prescription is ready to be filled, she does have significant co-pay, she will discussed our financial staff today to see if she can get a grant from our cancer center to cover her co-pay.  She will pick up Xeloda at [facility] when the grant is approved.  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 -Will do CT scan in late 05/2017 and based on scan she may be able to stop chemo at that time.  -She will start Xeloda 4 tabs in the AM and 3 tabs in the PM, when she receives it.   -F/u in 3 weeks  2. 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 I recommend she sees a Dietitian to rule out lynch syndrome. She agreed to see genetics  -Had genetics apt on 1/2, she consented to genetics testing -genetic testing were negative for Lynch Syndrome   3. Anemia, iron deficient -s/p IV Feraheme 11/26, 12/1 -Anemia mild, Hg at 11.2 today (04/25/17) -will continue monitoring  4. Liver lesions -Her initial staging CT scan showed numerous small liver lesions, indeterminate, those are not hypermetabolic on PET scan. -Previously enlarged portal node was negative for metastasis, but also appears to have shrunk while on chemotherapy -I recommended to continue chemotherapy, Pt agreed to proceed with Xeloda.   5. History of stroke  -No residual neuro deficits. She functions well at home. -she is on aspirin and Plavix   6. HTN -her BP high in clinic previously, pt states it's been normal at home -will monitor  7. Paresthesias - fingers and feet -Denies numbness or tingling, but cold sensitivity has increased, lasting 2 weeks between chemo cycle -function and dexterity remain intact -Resolved, back to baseline  8. 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 Diarrhea, hypokalemia -she had 5 days of diarrhea last episode previously, 2-3 episodes per day; she used imodium infrequently -she will continue oral K supplement and increase K in her diet -maximize imodium for relief; previously reviewed dosing instructions -Potassium 3.6 today   Plan:  [facility] her co-pay for Xeloda.  She will refill Xeloda afterwards and start immediately  -Lab, flush and f/u in 3 weeks    No orders of the defined types were placed in this encounter.   All questions were answered. The patient knows to call the clinic with any problems, questions or concerns.  I spent 20 minutes counseling the patient face to face. The total time spent in the appointment was 25 minutes and more than 50% was on counseling.  This document serves as a record of services personally performed by YTruitt [name] MD. It was created on her behalf by AJoslyn Devon a trained medical  scribe. The creation of this record is based on the scribe's personal observations and the provider's statements to them.    I have reviewed the above documentation for accuracy and completeness, and I agree with the above.    [name], MD 04/25/2017

## 2017-04-26 ENCOUNTER — Encounter: Payer: Self-pay | Admitting: Hematology

## 2017-04-26 ENCOUNTER — Other Ambulatory Visit: Payer: Self-pay | Admitting: *Deleted

## 2017-04-26 ENCOUNTER — Telehealth: Payer: Self-pay | Admitting: *Deleted

## 2017-04-26 ENCOUNTER — Telehealth: Payer: Self-pay | Admitting: Family Medicine

## 2017-04-26 DIAGNOSIS — R69 Illness, unspecified: Secondary | ICD-10-CM | POA: Diagnosis not present

## 2017-04-26 DIAGNOSIS — C188 Malignant neoplasm of overlapping sites of colon: Secondary | ICD-10-CM

## 2017-04-26 MED ORDER — GABAPENTIN 100 MG PO CAPS: 100.0000 mg | ORAL_CAPSULE | Freq: Two times a day (BID) | ORAL | 2 refills | Status: DC | PRN

## 2017-04-26 MED FILL — CAPECITABINE 500 MG TABLET: 500 | 21 days supply | Qty: 98 | Fill #0

## 2017-04-26 NOTE — Telephone Encounter (Signed)
Call received from [place]  in reference to Gabapentin refill.  Call transferred to ext. [phone] for best help with this matter.

## 2017-04-26 NOTE — Progress Notes (Signed)
Patient came in today to bring proof of household income for the [employer].  Patient approved and has a copy of the approval letter as well as the Outpatient pharmacy information. Called [facility] pharmacy([facility]) to confirm rx ready for pick up. She states she would call [facility] Amass and call me back.

## 2017-04-26 NOTE — Progress Notes (Signed)
Kristen from pharmacy called back and states she would have the rx ready for patient to pick up in about 30 minutes. Advised her patient has grant funds to cover the copay available. She has my name and number for any additional questions or concerns.  Advised patient where to pick up medication. Patient has my card for any additional financial questions or concerns.

## 2017-04-26 NOTE — Telephone Encounter (Signed)
Pt dropped off [employer] medical release form to be filled out. Placed in Rx tower.

## 2017-04-27 ENCOUNTER — Telehealth: Payer: Self-pay | Admitting: *Deleted

## 2017-04-27 NOTE — Telephone Encounter (Signed)
FYI  "Capecitabine received last night.  afraid to start.  I'm on eloquis, I'm older, take vitamins and the medication form says this capecitabine cannot be taken or should be monitored.  How do I monitor?" Provided information from oral chemotherapy pharmacist.  "The medication has been reviewed, there is no interaction, safe to proceed beginning medication." Denies further questions or needs at this time.

## 2017-04-27 NOTE — Telephone Encounter (Signed)
In your inbox.

## 2017-04-28 NOTE — Telephone Encounter (Signed)
I think this form needs to go to her husband's doctor-because she herself does not use any electric medical equipment (correct me if I am wrong)  I will put it back in the IN box

## 2017-04-29 NOTE — Telephone Encounter (Signed)
Left VM letting pt know we can't fill out form and if she wants to pick it back up it's at the front desk

## 2017-04-29 NOTE — Telephone Encounter (Signed)
Pt said she tried to have her husband's doc fill the form out but they said they will not because the bill is in pt's name and that pt's doctor has to fill it out pt said if [name] fills out the form it will help her save $150 and also incase the power goes out they will be top priority

## 2017-04-29 NOTE — Telephone Encounter (Signed)
Sorry-he is not my patient so I cannot complete it (legally)

## 2017-05-04 DIAGNOSIS — H90A21 Sensorineural hearing loss, unilateral, right ear, with restricted hearing on the contralateral side: Secondary | ICD-10-CM | POA: Diagnosis not present

## 2017-05-04 DIAGNOSIS — H9313 Tinnitus, bilateral: Secondary | ICD-10-CM | POA: Diagnosis not present

## 2017-05-04 DIAGNOSIS — H903 Sensorineural hearing loss, bilateral: Secondary | ICD-10-CM | POA: Diagnosis not present

## 2017-05-04 DIAGNOSIS — H90A32 Mixed conductive and sensorineural hearing loss, unilateral, left ear with restricted hearing on the contralateral side: Secondary | ICD-10-CM | POA: Diagnosis not present

## 2017-05-04 DIAGNOSIS — H6121 Impacted cerumen, right ear: Secondary | ICD-10-CM | POA: Diagnosis not present

## 2017-05-04 DIAGNOSIS — H8143 Vertigo of central origin, bilateral: Secondary | ICD-10-CM | POA: Diagnosis not present

## 2017-05-05 NOTE — Telephone Encounter (Signed)
See 04-26-2017 eRx order for this medicine.

## 2017-05-09 ENCOUNTER — Other Ambulatory Visit: Payer: Medicare HMO

## 2017-05-09 ENCOUNTER — Ambulatory Visit: Payer: Medicare HMO | Admitting: Hematology

## 2017-05-09 ENCOUNTER — Ambulatory Visit: Payer: Medicare HMO

## 2017-05-12 ENCOUNTER — Telehealth: Payer: Self-pay | Admitting: Pharmacist

## 2017-05-12 NOTE — Telephone Encounter (Signed)
Oral Oncology Pharmacist Encounter  Received call from patient on 05/11/2017 with complaints about recent side effects from Xeloda.  Patient stated that she had vomited the day previously (05/10/2017) and she suspects it from her Xeloda medication. Upon further questioning, patient stated that she had eaten a pound of barbecue and a very large helping of strawberry cobbler prior to emesis. Patient stated that she vomited despite use of an antinausea medication. Patient with questions whether or not this episode was due to her Xeloda.  She has 1 day left and current cycle before her week off. Next office visit on 05/16/2017. Patient stated she was feeling well that morning and not attempted to eat yet.  We discussed eating several small meals throughout the day as well as components of the brat diet.  Patient stated she was willing to try smaller meals and food slightly less flavorful to see if she was able to tolerate that better.  Prior to getting off the phone patient also stated that she has had several episodes of extremely urgent diarrhea.  She states this is new since starting her Xeloda.  Patient will not take her Xeloda on her last day of her cycle, 05/12/2017.  She will discuss continued use of Xeloda while in the office on 05/16/2017.  Patient knows to call the office with any additional questions or concerns.  Oral Oncology Clinic will continue to follow.  Johny Drilling, PharmD, BCPS, BCOP 05/12/2017 3:49 PM Oral Oncology Clinic [phone]

## 2017-05-16 ENCOUNTER — Inpatient Hospital Stay: Payer: Medicare HMO

## 2017-05-16 ENCOUNTER — Encounter: Payer: Self-pay | Admitting: Hematology

## 2017-05-16 ENCOUNTER — Inpatient Hospital Stay (HOSPITAL_BASED_OUTPATIENT_CLINIC_OR_DEPARTMENT_OTHER): Payer: Medicare HMO | Admitting: Hematology

## 2017-05-16 ENCOUNTER — Telehealth: Payer: Self-pay | Admitting: Hematology

## 2017-05-16 VITALS — BP 130/64 | HR 87 | Temp 97.8°F | Resp 18 | Ht 64.0 in | Wt 146.6 lb

## 2017-05-16 DIAGNOSIS — D509 Iron deficiency anemia, unspecified: Secondary | ICD-10-CM | POA: Diagnosis not present

## 2017-05-16 DIAGNOSIS — I10 Essential (primary) hypertension: Secondary | ICD-10-CM

## 2017-05-16 DIAGNOSIS — C787 Secondary malignant neoplasm of liver and intrahepatic bile duct: Secondary | ICD-10-CM

## 2017-05-16 DIAGNOSIS — E876 Hypokalemia: Secondary | ICD-10-CM | POA: Diagnosis not present

## 2017-05-16 DIAGNOSIS — I81 Portal vein thrombosis: Secondary | ICD-10-CM | POA: Diagnosis not present

## 2017-05-16 DIAGNOSIS — D500 Iron deficiency anemia secondary to blood loss (chronic): Secondary | ICD-10-CM

## 2017-05-16 DIAGNOSIS — D696 Thrombocytopenia, unspecified: Secondary | ICD-10-CM | POA: Diagnosis not present

## 2017-05-16 DIAGNOSIS — R69 Illness, unspecified: Secondary | ICD-10-CM | POA: Diagnosis not present

## 2017-05-16 DIAGNOSIS — C188 Malignant neoplasm of overlapping sites of colon: Secondary | ICD-10-CM

## 2017-05-16 DIAGNOSIS — C189 Malignant neoplasm of colon, unspecified: Secondary | ICD-10-CM

## 2017-05-16 DIAGNOSIS — M818 Other osteoporosis without current pathological fracture: Secondary | ICD-10-CM | POA: Diagnosis not present

## 2017-05-16 LAB — RETICULOCYTES
RBC.: 3.36 MIL/uL — ABNORMAL LOW (ref 3.70–5.45)
Retic Count, Absolute: 114.2 10*3/uL — ABNORMAL HIGH (ref 33.7–90.7)
Retic Ct Pct: 3.4 % — ABNORMAL HIGH (ref 0.7–2.1)

## 2017-05-16 LAB — COMPREHENSIVE METABOLIC PANEL
ALT: 15 U/L (ref 0–55)
AST: 16 U/L (ref 5–34)
Albumin: 3.6 g/dL (ref 3.5–5.0)
Alkaline Phosphatase: 93 U/L (ref 40–150)
Anion gap: 8 (ref 3–11)
BUN: 17 mg/dL (ref 7–26)
CO2: 27 mmol/L (ref 22–29)
Calcium: 9.6 mg/dL (ref 8.4–10.4)
Chloride: 106 mmol/L (ref 98–109)
Creatinine, Ser: 0.88 mg/dL (ref 0.60–1.10)
GFR calc Af Amer: >60 mL/min (ref >60)
GFR calc non Af Amer: >60 mL/min (ref >60)
Glucose, Bld: 96 mg/dL (ref 70–140)
Potassium: 2.9 mmol/L — CL (ref 3.5–5.1)
Sodium: 141 mmol/L (ref 136–145)
Total Bilirubin: 1 mg/dL (ref 0.2–1.2)
Total Protein: 6.5 g/dL (ref 6.4–8.3)

## 2017-05-16 LAB — CBC WITH DIFFERENTIAL (CANCER CENTER ONLY)
Basophils Absolute: 0 10*3/uL (ref 0.0–0.1)
Basophils Relative: 0 %
Eosinophils Absolute: 0.1 10*3/uL (ref 0.0–0.5)
Eosinophils Relative: 1 %
HCT: 31.7 % — ABNORMAL LOW (ref 34.8–46.6)
Hemoglobin: 10.9 g/dL — ABNORMAL LOW (ref 11.6–15.9)
Lymphocytes Relative: 33 %
Lymphs Abs: 2.3 10*3/uL (ref 0.9–3.3)
MCH: 32.4 pg (ref 25.1–34.0)
MCHC: 34.4 g/dL (ref 31.5–36.0)
MCV: 94.3 fL (ref 79.5–101.0)
Monocytes Absolute: 1.1 10*3/uL — ABNORMAL HIGH (ref 0.1–0.9)
Monocytes Relative: 16 %
Neutro Abs: 3.5 10*3/uL (ref 1.5–6.5)
Neutrophils Relative %: 50 %
Platelet Count: 248 10*3/uL (ref 145–400)
RBC: 3.36 MIL/uL — ABNORMAL LOW (ref 3.70–5.45)
RDW: 17.3 % — ABNORMAL HIGH (ref 11.2–14.5)
WBC Count: 7 10*3/uL (ref 3.9–10.3)

## 2017-05-16 LAB — FERRITIN: Ferritin: 193 ng/mL (ref 9–269)

## 2017-05-16 MED ORDER — CAPECITABINE 500 MG PO TABS: ORAL_TABLET | ORAL | 0 refills | Status: DC

## 2017-05-16 MED ORDER — SODIUM CHLORIDE 0.9% FLUSH
10.0000 mL | Freq: Once | INTRAVENOUS | Status: AC
  Administered 2017-05-16: 10 mL
  Filled 2017-05-16: qty 10

## 2017-05-16 MED ORDER — HEPARIN SOD (PORK) LOCK FLUSH 100 UNIT/ML IV SOLN
500.0000 [IU] | Freq: Once | INTRAVENOUS | Status: AC
  Administered 2017-05-16: 500 [IU]
  Filled 2017-05-16: qty 5

## 2017-05-16 MED FILL — CAPECITABINE 500 MG TABLET: 500 | 19 days supply | Qty: 74 | Fill #0

## 2017-05-16 NOTE — Progress Notes (Signed)
[name]  [phone] [phone]  Clinic Follow-up Note   Patient Care Team: Tower, [name], MD as PCP - General [name], MD as Consulting Physician (Hematology) [name], MD as Attending Physician (Gastroenterology) [name], MD as Consulting Physician (General Surgery)   Date of Service:  05/16/2017   CHIEF COMPLAINTS:  Follow up for Colorectal adenocarcinoma, moderately differentiated    Cancer of overlapping sites of colon [facility])   08/31/2016 Tumor Marker    Patient's tumor was tested for the following markers: CEA. Results of the tumor marker test revealed 2.0.      08/31/2016 Pathology Results    Colon biopsy Diagnosis: 1. Surgical [P], hepatic flexure. ADENOCARCINOMA. Moderately differentiated. 2. Surgical [P], transverse. ADENOCARCINOMA, Moderately differentiated.      08/31/2016 Initial Diagnosis    Colon cancer metastasized to liver ([facility])      08/31/2016 Procedure    - 1) Polypoid mass at the hepatic flexure, 2cm, soft but clearly neoplastic.  - 2) Clearly malignant mass in the distal transverse colon; 4cm, firm, 1/2 cirumference, ulcerated. ' - 3) Four small (3-69m) scattered typical appearing adenomas throughout the colon, notremoved since she mistakenly stayed on her plavix for this procedure.      09/01/2016 Imaging    CT Abdomen Pelvis W Contrast IMPRESSION: There are 2 colonic lesions involving the proximal ascending colon and proximal transverse colon, as detailed above, highly concerning for colonic neoplasm. In addition, there are at least 2 indeterminate liver lesions which are suspicious for potential metastatic disease.       09/16/2016 Imaging    Mr Liver W Wo Contrast IMPRESSION: Lesion within central right lobe of liver exhibits of peripheral enhancement and is suspicious for liver metastasis. Lesion within caudate lobe of liver identified on recent CT does not have a corresponding signal or  enhancement abnormality on today's study. Within the inferior right lobe of liver there is an enhancing structure which is favored to represent an atypical benign hemangioma with liver metastasis felt less likely. The transverse colon lesion is again identified compatible with colonic adenocarcinoma.      10/04/2016 PET scan    PET 10/04/16 IMPRESSION: 1. Highly hypermetabolic proximal transverse colon mass, maximum SUV 30.8. Small focus of hypermetabolic activity in the mesentery just above this mass probably represents a local involved lymph node. 2. Moderately hypermetabolic ascending colon mass, maximum SUV 8.7. 3. Highly hypermetabolic enlarged portacaval lymph node, maximum SUV 16.7. 4. Focus of hypermetabolic activity near the duodenum bulb could be physiologic or due to an adjacent lymph node. 5. None of the liver lesions are discernibly hypermetabolic. Particularly the more cephalad right hepatic lobe lesion merits surveillance, it had nonspecific enhancement characteristics on prior cross-sectional imaging and only a thin enhancing wall which could conceivably predispose to false negative due to central necrosis. 6. Several tiny chronic pulmonary nodules are likely benign. 7. Other imaging findings of potential clinical significance: Aortic Atherosclerosis (ICD10-I70.0). Coronary atherosclerosis. Bilateral nonobstructive nephrolithiasis.      11/10/2016 Surgery    LAPAROSCOPIC EXTENDED RIGHT COLECTOMY by Dr. [name]      11/10/2016 Pathology Results    Diagnosis 11/10/16 Colon, segmental resection for tumor, Ascending and Transverse ADENOCARCINOMA OF THE ASCENDING COLON WITH EXTRA CELLULAR MUCIN, GRADE 3, SIZE 3.4 CM THE TUMOR INVADES MUSCULARIS PROPRIA POORLY DIFFERENTIATED ADENOCARCINOMA OF THE TRANSVERSE COLON, GRADE 4, SIZE 6.1 CM THE TUMOR INVADES THROUGH MUSCULARIS PROPRIA INTO PERICOLONIC SOFT TISSUE EXTRAMURAL SATELLITE TUMOR NODULES IS IDENTIFIED (X1)  ALL MARGINS  OF RESECTION ARE NEGATIVE FOR CARCINOMA METASTATIC CARCINOMA IN ONE OF SIXTY LYMPH NODES (1/60) TABULAR ADENOMA X1      12/28/2016 Imaging    MRI abdomen w/wo contrast IMPRESSION: Motion degraded images.  Two indeterminate right liver lesions measuring up to 1.6 cm, as described above. Continued attention on follow-up is suggested.  Two periportal lymph nodes measuring up to 1.9 cm, suspicious for nodal metastases.  Postsurgical changes related to right colon resection.       12/29/2016 - 04/11/2017 Chemotherapy    FOLFOX every 2 weeks starting 12/29/16 last cycle 7 on 04/11/17.       02/03/2017 Procedure    EUS Per [name] Endoscopic Finding Findings: The examined esophagus was endoscopically normal. The entire examined stomach was endoscopically normal. The examined duodenum was endoscopically normal. Endosonographic Finding (limited evaluation for tissue acquisition): 1. One enlarged lymph node (vs soft tissue mass) was visualized in the porta hepatis region. It measured 16 mm in maximal cross-sectional diameter. The node (vs soft tissue) was oval, isoechoic and had poorly defined margins. Fine needle aspiration for cytology was performed. Color Doppler imaging was utilized prior to needle puncture to confirm a lack of significant vascular structures within the needle path. Threepasses were made with the 25 gauge needle using a transgastric approach. 2. CBD was normal, non-dilated 3. Limited views of the liver, spleen, pancreas were all normal.  Impression: One periportal soft tissue mass (lymphnode?) measuring 22m was noted and sampled with FNA.      02/03/2017 Pathology Results    Diagnosis FINE NEEDLE ASPIRATION, ENDOSCOPIC, PERI PORTAL NODE (SPECIMEN 1 OF 1 COLLECTED 02/03/17): NO MALIGNANT CELLS IDENTIFIED. SCANT LYMPHOID AND BENIGN GLANDULAR ELEMENTS.      03/09/2017 Imaging    IMPRESSION: Interval decrease in several tiny low-attenuation liver lesions  the, consistent with improving hepatic metastases.  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  TomorNext Lynch+ cancer Next was ordered throught he laboratory [facility]  TumorNext Lynch with CancerNext: Paired Germline and Tumor Analyses for EnhancedDiagnosis of Lynch Syndrome plus Analyses of 29 Additional Genes Associated with Hereditary Cancer.  Germline Genes analyzed: MLH1, MSH2, MSH6, PMS2, APC, ATM, BARD1, BMPR1A, BRIP1, CDH1, CDKN2A, CHEK2, DICER1, MRE11A, MUTYH, NBN, PALB2, PTEN, RAD50, RAD51C, RAD51D, SMAD4, STK11, TP53, CDK4, NF1, BRCA1, BRCA2, POLD1, POLE, SMARCA4, HOXB13 (sequencing and deletion/duplication); EPCAM, GREM1 (deletion/duplication only).  Results: Germline- Negative, no variants detected.  Tumor/Somatic-  BRAF V600E detected, No variants in KRAS or NRAS detected.  MLH1 promoter hypermethylation present, Microsatellite High.   Somatic pathogenic mutations in MSH2 c.687delA and pathogenic mutation in MSH6 c.3261dupC.  2 Somatic Variants of uncertain significance in MSH2 called p.A123T and p.D654N.   The date of this test report is 04/07/2017.        04/26/2017 -  Chemotherapy    Xeloda 20064min the AM and 150039mn the PM started on 04/26/17. Changed to 1500m63mD on 05/16/17       HISTORY OF PRESENTING ILLNESS: 09/23/16 VirgMyrla Halstedy74. female is here because of Colorectal adenocarcinoma, moderately differentiated  Referred by Gastroenterologist, Dr. DaniOwens Loffler abnormal colon biopsy. Accompanied by family friend.   Pt initially presented with one episode of rectal bleeding one year  ago, but did not pursue further workup until lately, she was referred to Gastroenterologist, [name] on 07/13/16 for Routine  colonoscopy. She was referred to him by [name] for repeat colonoscopy for colon CA screening. Pt last colonoscopy was with [name] in August 2011 that was completed for hx of polyps and was significant for hemorrhoids and otherwise negative.   On 08/31/16, pt underwent screening colonoscopy, which unfortunately showed 2 masses in the proximal and distant transverse colon, biopsy both showed moderately differentiated adenocarcinoma.   Pt had a CT Abdomen Pelvis that was ordered by [name] and completed on 09/01/16 and significant for colonic neoplasm and 2 indeterminate liver lesions suspicious for potential metastatic dx.   Pt had a CT Chest W Constrast that was ordered by [name] and completed on 09/06/16 and significant for no metastasis.    Pt had MR Liver W WO Contrast that was ordered by [name] and completed on 09/16/16 and significant for colonic adenocarcinoma and indeterminate liver lesions.  Pt notes that she was referred to the office by [name]. Pt has an appointment with Surgeon, [name] tomorrow, 8/3 to discuss her options. She notes that [name] recommended a liver biopsy. Pt states that she had 1 year ago she had one large episode of rectal bleeding following consuming a milkshake, 1 episode of emesis, and a large episode of rectal bleeding that resolved on its own. Denies recurrent bleeding following initial rectal bleeding. Denies being evaluated last year for her rectal bleeding due to having issues with finding a GI specialist. Pt last colonoscopy was 7 years ago. Pt reports that due to her chronic constipation, she uses suppositories and OTC laxatives. She states that she eats mainly meat and starches due to her dislike for vegetables. She notes that she recently lost approximately 10 lbs over the past year. 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 She states that she lives with her husband and has 3 older children, 2 in [place] and 1 in [place]. Pt friend notes that the pt grandchildren intermittently help her out. She states that her husband has severe medical issues with internal bleeding and is currently on oxygen. Pt reports that she completes all of the housework and outside work due to her husband being unable to complete daily activities.   Pt has intermittent GERD that she used to take Nexium for. Denies PMHx of HTN, cardiac issues. Denies taking HTN medications. Denies MI, but notes that she was evaluated for CP symptoms and was found to have a 20% blockage in her arteries. Pt reports that she had a stroke with resolved residual right sided weakness summer 2017 that she was evaluated the next day at [facility] and was informed by her horse vet to be evaluated. Pt states that she took 2 ASA following her initial symptoms. Pt reports that she takes ASA and plavix for. She states that she stopped taking her plavix 2 weeks ago following the colonoscopy. Pt sates that she takes Rx Lipitor for cholesterol. Pt reports that she takes multi-vitamin and that [name] recommended that she takes vitamin-D.   Pt had cholecystectomy and partial hysterectomy with one ovary left. Pt denies family hx of CA. Denies smoking cigarettes, but reports that her husband used to smoke cigarettes. Pt denies ETOH use at this time.   On  review of systems, pt denies fever, chills. Endorses 10 lb weight loss, decreased energy levels. Denies pain. Pt reports constipation treated with laxatives. Pt reports that she has hemorrhoids. Pt denies abdominal pain, nausea, vomiting, abdominal distension.    CURRENT THERAPY: Xeloda 2037m in the AM and 15024min the PM started on 04/26/17. Changed to 150031mID on 05/16/17  INTERVAL HISTORY:  VirArdythe Klute here for a follow up. She presents to the clinic today by herself. She notes she is doing okay  overall. She reports that during her first cycle of Xeloda she had vomiting once at the end with 1 day left. She notes to have a rash on her bilateral arms that does not itch. She reports she is having continued ear problems.   On review of systems, pt denies any other complaints at this time. Pertinent positives are listed and detailed within the above HPI.   MEDICAL HISTORY:  Past Medical History:  Diagnosis Date  . Anemia   . Arthritis   . Astigmatism    both eyes  . [facility]  [facility] done last chemo tx 01-17-17  . Colon cancer ([facility] . Coronary artery disease, non-occlusive   . Dyspnea    more so exertion  . Dysrhythmia    skipped beats every now and then  . GERD (gastroesophageal reflux disease)   . Glaucoma    both eyes right eye worse than left  . Hammer toes of both feet   . Headache    migraines with aura  . Heel spur    both heels and front  . HSV (herpes simplex virus) infection   . Hyperglycemia   . Hyperlipidemia   . IBS (irritable bowel syndrome)   . IC (interstitial cystitis)   . Insomnia disorder related to known organic factor   . Osteoporosis   . PONV (postoperative nausea and vomiting)    has had more surgeries, and had no problems  . Stroke ([facility]  . Vitamin D deficiency     SURGICAL HISTORY: Past Surgical History:  Procedure Laterality Date  . ABDOMINAL HYSTERECTOMY     1 ovary and womb removed  . CARDIAC CATHETERIZATION  7/12   non obst dz -- not sure what yr  . CHOLECYSTECTOMY    . EUS N/A 02/03/2017   Procedure: UPPER ENDOSCOPIC ULTRASOUND (EUS) LINEAR;  Surgeon: JacMilus BanisterD;  Location: [facility];  Service: Endoscopy;  Laterality: N/A;  . EYE SURGERY Bilateral    ioc for cataracts   . LAPAROSCOPIC RIGHT COLECTOMY Right 11/10/2016   Procedure: LAPAROSCOPIC EXTENDED RIGHT COLECTOMY;  Surgeon: ByeStark KleinD;  [facility];  Laterality: Right;  . PORTACATH PLACEMENT Left 12/22/2016    Procedure: INSERTION PORT-A-CATH;  Surgeon: ByeStark KleinD;  [facility];  Laterality: Left;  . ROTATOR CUFF REPAIR Bilateral     SOCIAL HISTORY: Social History   Socioeconomic History  . Marital status: Married    Spouse name: Not on file  . Number of children: 4  . Years of education: Not on file  . Highest education level: Not on file  Occupational History  . Occupation: reitred    EmpFish farm managerETIRED  Social Needs  . Financial resource strain: Not on file  . Food insecurity:    Worry: Not on file    Inability: Not on file  . Transportation needs:    Medical: Not on  file    Non-medical: Not on file  Tobacco Use  . Smoking status: Never Smoker  . Smokeless tobacco: Never Used  Substance and Sexual Activity  . Alcohol use: No    Alcohol/week: 0.0 oz  . Drug use: No  . Sexual activity: Not on file  Lifestyle  . Physical activity:    Days per week: Not on file    Minutes per session: Not on file  . Stress: Not on file  Relationships  . Social connections:    Talks on phone: Not on file    Gets together: Not on file    Attends religious service: Not on file    Active member of club or organization: Not on file    Attends meetings of clubs or organizations: Not on file    Relationship status: Not on file  . Intimate partner violence:    Fear of current or ex partner: Not on file    Emotionally abused: Not on file    Physically abused: Not on file    Forced sexual activity: Not on file  Other Topics Concern  . Not on file  Social History Narrative   Rides horses         Specialty providers    [facility]   Cardio- [facility]   Ortho--[place]   Rheum-[facility]   urol-[facility]   [facility]-- [facility]    FAMILY HISTORY: Family History  Problem Relation Age of Onset  . Alcohol abuse Mother   . Heart attack Mother   . GER disease Mother   . GER disease Father   . Alcohol abuse Father   . Stroke Father   . Tuberculosis  Father   . Heart attack Father   . Aneurysm Father   . Mental illness Sister   . Depression Sister   . Breast cancer Neg Hx     ALLERGIES:  is allergic to salmon [fish allergy]; ampicillin; codeine; flagyl [metronidazole]; meperidine hcl; neomycin; penicillins; propoxyphene hcl; sulfa antibiotics; and tramadol hcl.  MEDICATIONS:  Current Outpatient Medications  Medication Sig Dispense Refill  . apixaban (ELIQUIS) 5 MG TABS tablet Take 1 tablet (5 mg total) by mouth 2 (two) times daily. 60 tablet 2  . atorvastatin (LIPITOR) 40 MG tablet Take 1 tablet (40 mg total) by mouth daily at 6 PM. 90 tablet 3  . b complex vitamins tablet Take 1 tablet by mouth daily.    . bimatoprost (LUMIGAN) 0.01 % SOLN Place 1 drop into both eyes daily.    . Ca Carbonate-Mag Hydroxide (ROLAIDS PO) Take 1-2 tablets by mouth every 8 (eight) hours as needed (for acid reflux).     . Cholecalciferol (VITAMIN D) 2000 units tablet Take 2,000 Units by mouth daily.    . COMBIGAN 0.2-0.5 % ophthalmic solution     . famotidine (PEPCID) 20 MG tablet Take 1 tablet (20 mg total) by mouth at bedtime. 60 tablet 0  . gabapentin (NEURONTIN) 100 MG capsule Take 1 capsule (100 mg total) by mouth 2 (two) times daily as needed. 30 capsule 2  . Ibuprofen-Diphenhydramine Cit (IBUPROFEN PM) 200-38 MG TABS Take 1 tablet by mouth daily.    Marland Kitchen lactose free nutrition (BOOST) LIQD Take 237 mLs by mouth daily.    Marland Kitchen lidocaine-prilocaine (EMLA) cream Apply to affected area once 30 g 3  . Multiple Vitamin (MULTIVITAMIN WITH MINERALS) TABS tablet Take 1 tablet by mouth daily.    . prochlorperazine (COMPAZINE) 10 MG tablet Take 1 tablet (10 mg total)  by mouth every 6 (six) hours as needed (Nausea or vomiting). 30 tablet 1  . capecitabine (XELODA) 500 MG tablet Take 3 tabs (1586m) twice daily within 374m after food, for 2 weeks on, 1 week off. Pt has 10 tabs left from last cycle 74 tablet 0  . ondansetron (ZOFRAN) 8 MG tablet Take 1 tablet (8 mg  total) by mouth 2 (two) times daily as needed for refractory nausea / vomiting. Start on day 3 after chemotherapy. (Patient not taking: Reported on 05/16/2017) 30 tablet 1  . polyethylene glycol (MIRALAX / GLYCOLAX) packet Take 17 g by mouth daily. (Patient not taking: Reported on 04/11/2017) 14 each 0  . potassium chloride SA (K-DUR,KLOR-CON) 20 MEQ tablet Take 1 tablet (20 mEq total) by mouth daily. (Patient not taking: Reported on 04/11/2017) 30 tablet 1   No current facility-administered medications for this visit.     REVIEW OF SYSTEMS:  Constitutional: Denies fevers, chills or abnormal night sweats   Eyes: Denies blurriness of vision, double vision or watery eyes Ears, nose, mouth, throat, and face: Denies mucositis or sore throat (+) hard of Hearing Respiratory: Denies cough, dyspnea or wheezes Cardiovascular: Denies palpitation, chest discomfort or lower extremity swelling Gastrointestinal:  Denies nausea, heartburn (+) vomiting once from Xeloda   Skin: Denies abnormal skin rashes Lymphatics: Denies new lymphadenopathy or easy bruising Neurological:Denies numbness, tingling or new weaknesses Behavioral/Psych: Mood is stable, no new changes  All other systems were reviewed with the patient and are negative.  PHYSICAL EXAMINATION: ECOG PERFORMANCE STATUS: 2  Vitals:   05/16/17 1400  BP: 130/64  Pulse: 87  Resp: 18  Temp: 97.8 F (36.6 C)  SpO2: 99%   Filed Weights   05/16/17 1400  Weight: 146 lb 9.6 oz (66.5 kg)     GENERAL:alert, no distress and comfortable SKIN: skin color, texture, turgor are normal, no rashes or significant lesions EYES: normal, conjunctiva are pink and non-injected, sclera clear OROPHARYNX:no exudate, no erythema and lips, buccal mucosa, and tongue normal  NECK: supple, thyroid normal size, non-tender, without nodularity LYMPH:  no palpable lymphadenopathy in the cervical, axillary or inguinal LUNGS: clear to auscultation and percussion with normal  breathing effort HEART: regular rate & rhythm and no murmurs and no lower extremity edema ABDOMEN:abdomen soft, non-tender and normal bowel sounds. No organomegaly noted. (+) midline incision above umbilical , well healed, non-tender, no scar tissue.  Musculoskeletal:no cyanosis of digits and no clubbing  PSYCH: alert & oriented x 3 with fluent speech NEURO: no focal motor/sensory deficits   LABORATORY DATA:  I have reviewed the data as listed CBC Latest Ref Rng & Units 05/16/2017 04/25/2017 04/11/2017  WBC 3.9 - 10.3 K/uL 7.0 6.6 4.1  Hemoglobin 11.6 - 15.9 g/dL - - -  Hematocrit 34.8 - 46.6 % 31.7(L) 33.5(L) 34.8  Platelets 145 - 400 K/uL 248 139(L) 98(L)    CMP Latest Ref Rng & Units 05/16/2017 04/25/2017 04/11/2017  Glucose 70 - 140 mg/dL 96 107 108  BUN 7 - 26 mg/dL '17 17 14  ' Creatinine 0.60 - 1.10 mg/dL 0.88 0.94 0.94  Sodium 136 - 145 mmol/L 141 141 141  Potassium 3.5 - 5.1 mmol/L 2.9(LL) 3.6 3.8  Chloride 98 - 109 mmol/L 106 107 107  CO2 22 - 29 mmol/L '27 25 26  ' Calcium 8.4 - 10.4 mg/dL 9.6 9.7 9.7  Total Protein 6.4 - 8.3 g/dL 6.5 6.6 6.6  Total Bilirubin 0.2 - 1.2 mg/dL 1.0 0.4 0.4  Alkaline Phos 40 -  150 U/L 93 100 111  AST 5 - 34 U/L 16 25 42(H)  ALT 0 - 55 U/L 15 35 54    CEA 08/31/16: 2.0   PATHOLOGY REPORT:  Diagnosis 11/10/16 Colon, segmental resection for tumor, Ascending and Transverse ADENOCARCINOMA OF THE ASCENDING COLON WITH EXTRA CELLULAR MUCIN, GRADE 3, SIZE 3.4 CM THE TUMOR INVADES MUSCULARIS PROPRIA POORLY DIFFERENTIATED ADENOCARCINOMA OF THE TRANSVERSE COLON, GRADE 4, SIZE 6.1 CM THE TUMOR INVADES THROUGH MUSCULARIS PROPRIA INTO PERICOLONIC SOFT TISSUE EXTRAMURAL SATELLITE TUMOR NODULES IS IDENTIFIED (X1) ALL MARGINS OF RESECTION ARE NEGATIVE FOR CARCINOMA METASTATIC CARCINOMA IN ONE OF SIXTY LYMPH NODES (1/60) TABULAR ADENOMA X1 Microscopic Comment COLON AND RECTUM (INCLUDING TRANS-ANAL RESECTION): Specimen: Right and transverse colon Procedure:  Segmental resection Tumor site: Ascending and transverse colon Specimen integrity: Intact Macroscopic intactness of mesorectum: Not applicable: X Complete: NA Near complete: NA Incomplete: NA Cannot be determined (specify): NA Macroscopic tumor perforation: Pericolonic soft tissue Invasive tumor: Maximum size: 6.1 cm and 3.4 cm Histologic type(s): Adenocarcinoma Histologic grade and differentiation: G1: well differentiated/low grade Microscopic Comment(continued) G2: moderately differentiated/low grade G3: poorly differentiated/high grade G4: undifferentiated/high grade Type of polyp in which invasive carcinoma arose: Tubular adenoma Microscopic extension of invasive tumor: Pericolonic soft tissue Lymph-Vascular invasion: Identified Peri-neural invasion: Negative Tumor deposit(s) (discontinuous extramural extension): Present Resection margins: Proximal margin: Negative Distal margin: Negative Circumferential (radial) (posterior ascending, posterior descending; lateral and posterior mid-rectum; and entire lower 1/3 rectum): Negative Mesenteric margin (sigmoid and transverse): Negative Distance closest margin (if all above margins negative): 1.5 cm from the mesenteric margin Trans-anal resection margins only: Deep margin: NA Mucosal Margin: NA Distance closest mucosal margin (if negative): NA Treatment effect (neo-adjuvant therapy): Negative Additional polyp(s): Tubular adenoma Non-neoplastic findings: Unremarkble Lymph nodes: number examined 60; number positive: 1 Pathologic Staging: pT3 (m), pN1c, pMx Ancillary studies: Ordered    Colon biopsy, 08/31/16 Diagnosis 1. Surgical [P], hepatic flexure - ADENOCARCINOMA. Moderately differentiated.  2. Surgical [P], transverse - ADENOCARCINOMA, Moderately differentiated.  RADIOGRAPHIC STUDIES: I have personally reviewed the radiological images as listed and agreed with the findings in the report. No results  found.  PROCEDURES   COLONOSCOPY 08/31/2016 DR. JACOBS  - 1) Polypoid mass at the hepatic flexure, 2cm, soft but clearly neoplastic. This was biopsied and the site was injected with Spot. - 2) Clearly malignant mass in the distal transverse colon; 4cm, firm, 1/2 cirumference, ulcerated. This was biopsied and the site was also injected with Spot. - 3) Four small (3-48m) scattered typical appearing adenomas throughout the colon, not removed since she mistakenly stayed on her plavix for this procedure.  ASSESSMENT & PLAN:   VTenita Cue is a 77y.o. caucasian female with colorectal adenocarcinoma, moderately differentiated, found on screening colonoscopy.   1. Synchronized colon adenocarcinoma, moderately differentiated, in hepatic fracture and distal transverse colon, pT3(m)N1cMx, at least stage IIIB, MSI-H -I have reviewed her colonoscopy, biopsy and CT and MRI image findings with patient and her friend in great details. -She has to synchronized colon mass in the hepatic flexure and distal transverse colon, post biopsy showed moderately-differentiated adenocarcinoma -CT and abdominal MRI showed 2 lesions in right lobe of liver, at least one lesion is very suspicious for metastatic disease, and enlarged periportal lymph nodes.  --We discussed 10/04/16 PET. There is no definitive distant metastasis on the PET scan. There was no uptake in her liver, liver metastasis is less likely, although not ruled out. There is a hypermetabolic lymph node in the portacaval region measuring  1.5 cm with SUV 16.7, compatible with malignancy. --12/28/16 MRI of abdomen revealed no significant change in liver lesions, but noted slightly enlarged periportal lymphadenopathy from previous scan, suspicious for nodal metastasis. -I previously recommend EUS biopsy of the portacaval lymph node, which was negative  -She has been on chemotherapy FOLFOX, it has become more difficult for her continue treatment, due to  side effects.  -She previously requested to stop IV chemo because of financial burden and because she wants to get back to her life and her horses. Since metastatic disease is not ruled out, I recommend her to continue chemotherapy a little longer, for at least a total of 6 months. FOLFOX was switched to Xeloda -Although she is tolerating Xeloda well overall, she does have mild side effects, and one episode of vomiting, she is very reluctant to continue chemo for much longer.  She agrees to do 1 more cycle, then stop. - Plan to start next cycle on 05/30/17 She can decrease her dose to 1500 mg BID.   -Will do CT AP W Contrast scan in late 05/2017.  If no disease progression, I plan to stop her chemotherapy and watch her.   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 -f/u in 4-5 weeks after scan   2. 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 I previously recommend she sees a Dietitian to rule out lynch syndrome. She agreed to see genetics  -Had genetics apt on 1/2, she consented to genetics testing -genetic testing were negative for Lynch Syndrome   3. Anemia, iron deficient -s/p IV Feraheme 11/26, 12/1 -Anemia mild, Hg at 10.9 today (05/16/17) -will continue monitoring  4. Liver lesions -Her initial staging CT scan showed numerous small liver lesions, indeterminate, those are not hypermetabolic on PET scan. -Previously enlarged portal node was negative for metastasis, but also appears to have shrunk while on chemotherapy -will repeat scan next month   5. History of stroke  -No residual neuro deficits. She functions well at home. -she is on aspirin and Plavix   6. HTN -her BP high in clinic previously, pt states it's been normal at home -will monitor  7. 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 Diarrhea, hypokalemia -Intermittent diarrhea from chemotherapy, overall mild; she used imodium infrequently -she will continue oral K supplement and increase K in her diet -I encouraged her to increase Imodium as needed -K is 2.9 today (05/16/17) I advised her to restart taking a K supplement, 2102m BID for 3 days and then QD afterwards   Plan:  -Continue with reduced dose Xeloda 150055mID, she will start last cycle on 4/8  -CT AP W Contrast after next cycle in 4 weeks  -Start K supplement BID for 3 days and then QD afterwards  -f/u in 5 weeks    Orders Placed This Encounter  Procedures  . CT Abdomen Pelvis W Contrast    Standing Status:   Future    Standing Expiration Date:   05/16/2018    Order Specific Question:  If indicated for the ordered procedure, I authorize the administration of contrast media per Radiology protocol    Answer:   Yes    Order Specific Question:   Preferred imaging location?    Answer:   [facility]    Order Specific Question:   Radiology Contrast Protocol - do NOT remove file path    Answer:   [email or web address]    All questions were answered. The patient knows to call the clinic with any problems, questions or concerns.  I spent 25 minutes counseling the patient face to face. The total time spent in the appointment was 30 minutes and more than 50% was on counseling.  This document serves as a record of services personally performed by [name], MD. It was created on her behalf by Theresia Bough, a trained medical scribe. The creation of this record is based on the scribe's  personal observations and the provider's statements to them.   I have reviewed the above documentation for accuracy and completeness, and I agree with the above.    [name], MD 05/16/2017

## 2017-05-16 NOTE — Telephone Encounter (Signed)
Appointments scheduled letter/calendar mailed to patient along with Radiology phone number for CT/pt not present to schedule/ no contrast given per 3/25 los

## 2017-05-18 ENCOUNTER — Other Ambulatory Visit: Payer: Self-pay | Admitting: Hematology

## 2017-05-19 ENCOUNTER — Ambulatory Visit: Payer: Medicare HMO | Admitting: Primary Care

## 2017-05-23 ENCOUNTER — Telehealth: Payer: Self-pay | Admitting: *Deleted

## 2017-05-23 NOTE — Telephone Encounter (Signed)
Pt called reporting a bad toothache, and requested a call back from nurse.   Spoke with pt and was informed that pt is off Xeloda now.   Will restart on  05/30/17.  Stated she has bad toothache, and wanted to know if she should get something OTC.   Instructed pt to contact her dentist for toothache issue before pt restarts Xeloda.  Reinforced with pt to let her dentist know of last completion date of Xeloda ( maybe 1 week ago per pt ).   Pt voiced understanding. Pt's    Phone      [phone].

## 2017-05-30 ENCOUNTER — Other Ambulatory Visit: Payer: Self-pay | Admitting: Hematology

## 2017-05-30 ENCOUNTER — Telehealth: Payer: Self-pay | Admitting: *Deleted

## 2017-05-30 DIAGNOSIS — C188 Malignant neoplasm of overlapping sites of colon: Secondary | ICD-10-CM

## 2017-05-30 NOTE — Telephone Encounter (Signed)
Spoke with pt today and was informed that pt had started Xeloda 1500 mg BID since Sunday  05/29/17.  Stated she has enough tablets for the 14 days course.

## 2017-06-03 ENCOUNTER — Other Ambulatory Visit: Payer: Self-pay | Admitting: Hematology

## 2017-06-03 DIAGNOSIS — C188 Malignant neoplasm of overlapping sites of colon: Secondary | ICD-10-CM

## 2017-06-13 ENCOUNTER — Encounter (HOSPITAL_COMMUNITY): Payer: Self-pay

## 2017-06-13 ENCOUNTER — Ambulatory Visit (HOSPITAL_COMMUNITY)
Admission: RE | Admit: 2017-06-13 | Discharge: 2017-06-13 | Disposition: A | Discharge status: Home / Self Care | Payer: Medicare HMO | Source: Ambulatory Visit | Attending: Hematology | Admitting: Hematology

## 2017-06-13 DIAGNOSIS — C787 Secondary malignant neoplasm of liver and intrahepatic bile duct: Secondary | ICD-10-CM | POA: Insufficient documentation

## 2017-06-13 DIAGNOSIS — C188 Malignant neoplasm of overlapping sites of colon: Secondary | ICD-10-CM | POA: Diagnosis not present

## 2017-06-13 DIAGNOSIS — C189 Malignant neoplasm of colon, unspecified: Secondary | ICD-10-CM | POA: Diagnosis not present

## 2017-06-13 MED ORDER — IOPAMIDOL (ISOVUE-300) INJECTION 61%
30.0000 mL | Freq: Once | INTRAVENOUS | Status: AC | PRN
  Administered 2017-06-13: 30 mL via ORAL

## 2017-06-13 MED ORDER — HEPARIN SOD (PORK) LOCK FLUSH 100 UNIT/ML IV SOLN
INTRAVENOUS | Status: AC
  Filled 2017-06-13: qty 5

## 2017-06-13 MED ORDER — IOPAMIDOL (ISOVUE-300) INJECTION 61%
INTRAVENOUS | Status: AC
Start: 2017-06-13 — End: 2017-06-13
  Filled 2017-06-13: qty 30

## 2017-06-13 MED ORDER — IOHEXOL 300 MG/ML  SOLN
100.0000 mL | Freq: Once | INTRAMUSCULAR | Status: AC | PRN
  Administered 2017-06-13: 100 mL via INTRAVENOUS

## 2017-06-13 MED ORDER — HEPARIN SOD (PORK) LOCK FLUSH 100 UNIT/ML IV SOLN
500.0000 [IU] | Freq: Once | INTRAVENOUS | Status: AC
  Administered 2017-06-13: 500 [IU] via INTRAVENOUS

## 2017-06-18 NOTE — Progress Notes (Signed)
[place]  Telephone:([phone]) [phone] Fax:(336) [phone]  Clinic Follow-up Note   Patient Care Team: Tower, [name], MD as PCP - General [name], MD as Consulting Physician (Hematology) [name], MD as Attending Physician (Gastroenterology) [name], MD as Consulting Physician (General Surgery)   Date of Service:  06/20/2017   CHIEF COMPLAINTS:  Follow up for Colorectal adenocarcinoma, moderately differentiated  Oncology History   Cancer Staging Cancer of overlapping sites of colon [name]) Staging form: Colon and Rectum, AJCC 8th Edition - Pathologic stage from 11/10/2016: Stage IIIB (pT3(m), pN1c, cM0) - Signed by [name], MD on 12/16/2016       Cancer of overlapping sites of colon [facility])   08/31/2016 Tumor Marker    Patient's tumor was tested for the following markers: CEA. Results of the tumor marker test revealed 2.0.      08/31/2016 Pathology Results    Colon biopsy Diagnosis: 1. Surgical [P], hepatic flexure. ADENOCARCINOMA. Moderately differentiated. 2. Surgical [P], transverse. ADENOCARCINOMA, Moderately differentiated.      08/31/2016 Initial Diagnosis    Colon cancer metastasized to liver ([name])      08/31/2016 Procedure    - 1) Polypoid mass at the hepatic flexure, 2cm, soft but clearly neoplastic.  - 2) Clearly malignant mass in the distal transverse colon; 4cm, firm, 1/2 cirumference, ulcerated. ' - 3) Four small (3-20m) scattered typical appearing adenomas throughout the colon, notremoved since she mistakenly stayed on her plavix for this procedure.      09/01/2016 Imaging    CT Abdomen Pelvis W Contrast IMPRESSION: There are 2 colonic lesions involving the proximal ascending colon and proximal transverse colon, as detailed above, highly concerning for colonic neoplasm. In addition, there are at least 2 indeterminate liver lesions which are suspicious for potential metastatic disease.       09/16/2016 Imaging    Mr Liver W Wo  Contrast IMPRESSION: Lesion within central right lobe of liver exhibits of peripheral enhancement and is suspicious for liver metastasis. Lesion within caudate lobe of liver identified on recent CT does not have a corresponding signal or enhancement abnormality on today's study. Within the inferior right lobe of liver there is an enhancing structure which is favored to represent an atypical benign hemangioma with liver metastasis felt less likely. The transverse colon lesion is again identified compatible with colonic adenocarcinoma.      10/04/2016 PET scan    PET 10/04/16 IMPRESSION: 1. Highly hypermetabolic proximal transverse colon mass, maximum SUV 30.8. Small focus of hypermetabolic activity in the mesentery just above this mass probably represents a local involved lymph node. 2. Moderately hypermetabolic ascending colon mass, maximum SUV 8.7. 3. Highly hypermetabolic enlarged portacaval lymph node, maximum SUV 16.7. 4. Focus of hypermetabolic activity near the duodenum bulb could be physiologic or due to an adjacent lymph node. 5. None of the liver lesions are discernibly hypermetabolic. Particularly the more cephalad right hepatic lobe lesion merits surveillance, it had nonspecific enhancement characteristics on prior cross-sectional imaging and only a thin enhancing wall which could conceivably predispose to false negative due to central necrosis. 6. Several tiny chronic pulmonary nodules are likely benign. 7. Other imaging findings of potential clinical significance: Aortic Atherosclerosis (ICD10-I70.0). Coronary atherosclerosis. Bilateral nonobstructive nephrolithiasis.      11/10/2016 Surgery    LAPAROSCOPIC EXTENDED RIGHT COLECTOMY by Dr. [name]      11/10/2016 Pathology Results    Diagnosis 11/10/16 Colon, segmental resection for tumor, Ascending and Transverse ADENOCARCINOMA OF THE ASCENDING COLON  WITH EXTRA CELLULAR MUCIN, GRADE 3, SIZE 3.4 CM THE TUMOR INVADES  MUSCULARIS PROPRIA POORLY DIFFERENTIATED ADENOCARCINOMA OF THE TRANSVERSE COLON, GRADE 4, SIZE 6.1 CM THE TUMOR INVADES THROUGH MUSCULARIS PROPRIA INTO PERICOLONIC SOFT TISSUE EXTRAMURAL SATELLITE TUMOR NODULES IS IDENTIFIED (X1) ALL MARGINS OF RESECTION ARE NEGATIVE FOR CARCINOMA METASTATIC CARCINOMA IN ONE OF SIXTY LYMPH NODES (1/60) TABULAR ADENOMA X1      12/28/2016 Imaging    MRI abdomen w/wo contrast IMPRESSION: Motion degraded images.  Two indeterminate right liver lesions measuring up to 1.6 cm, as described above. Continued attention on follow-up is suggested.  Two periportal lymph nodes measuring up to 1.9 cm, suspicious for nodal metastases.  Postsurgical changes related to right colon resection.       12/29/2016 - 04/11/2017 Chemotherapy    FOLFOX every 2 weeks starting 12/29/16 last cycle 7 on 04/11/17.       02/03/2017 Procedure    EUS Per [name] Endoscopic Finding Findings: The examined esophagus was endoscopically normal. The entire examined stomach was endoscopically normal. The examined duodenum was endoscopically normal. Endosonographic Finding (limited evaluation for tissue acquisition): 1. One enlarged lymph node (vs soft tissue mass) was visualized in the porta hepatis region. It measured 16 mm in maximal cross-sectional diameter. The node (vs soft tissue) was oval, isoechoic and had poorly defined margins. Fine needle aspiration for cytology was performed. Color Doppler imaging was utilized prior to needle puncture to confirm a lack of significant vascular structures within the needle path. Threepasses were made with the 25 gauge needle using a transgastric approach. 2. CBD was normal, non-dilated 3. Limited views of the liver, spleen, pancreas were all normal.  Impression: One periportal soft tissue mass (lymphnode?) measuring 53m was noted and sampled with FNA.      02/03/2017 Pathology Results    Diagnosis FINE NEEDLE ASPIRATION,  ENDOSCOPIC, PERI PORTAL NODE (SPECIMEN 1 OF 1 COLLECTED 02/03/17): NO MALIGNANT CELLS IDENTIFIED. SCANT LYMPHOID AND BENIGN GLANDULAR ELEMENTS.      03/09/2017 Imaging    IMPRESSION: Interval decrease in several tiny low-attenuation liver lesions the, consistent with improving hepatic metastases.  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  TomorNext Lynch+ cancer Next was ordered throught he laboratory [facility]  TumorNext Lynch with CancerNext: Paired Germline and Tumor Analyses for EnhancedDiagnosis of Lynch Syndrome plus Analyses of 29 Additional Genes Associated with Hereditary Cancer.  Germline Genes analyzed: MLH1, MSH2, MSH6, PMS2, APC, ATM, BARD1, BMPR1A, BRIP1, CDH1, CDKN2A, CHEK2, DICER1, MRE11A, MUTYH, NBN, PALB2, PTEN, RAD50, RAD51C, RAD51D, SMAD4, STK11, TP53, CDK4, NF1, BRCA1, BRCA2, POLD1, POLE, SMARCA4, HOXB13 (sequencing and deletion/duplication); EPCAM, GREM1 (deletion/duplication only).  Results: Germline- Negative, no variants detected.  Tumor/Somatic-  BRAF V600E detected, No variants in KRAS or NRAS detected.  MLH1 promoter hypermethylation present, Microsatellite High.   Somatic pathogenic mutations in MSH2 c.687delA and pathogenic mutation in MSH6 c.3261dupC.  2 Somatic Variants of uncertain significance in MSH2 called p.A123T and p.D654N.   The date of this test report is 04/07/2017.        04/26/2017 - 06/12/2017 Chemotherapy    Xeloda 20068min the AM and 150025mn the PM started on 04/26/17. Changed to 1500m45mD on 05/16/17      06/13/2017 Imaging  CT CAP W Contrast 06/13/17 IMPRESSION: 1. Continued regression of hepatic metastatic disease. No new or progressive findings. 2. Surgical changes from a right hemicolectomy. No findings  for recurrent cancer. No mesenteric or retroperitoneal mass or Adenopathy.       HISTORY OF PRESENTING ILLNESS: 09/23/16 [name] 77 y.o. female is here because of Colorectal adenocarcinoma, moderately differentiated  Referred by Gastroenterologist, [name] for abnormal colon biopsy. Accompanied by family friend.   Pt initially presented with one episode of rectal bleeding one year ago, but did not pursue further workup until lately, she was referred to Gastroenterologist, [name] on 07/13/16 for Routine colonoscopy. She was referred to him by [name] for repeat colonoscopy for colon CA screening. Pt last colonoscopy was with [name] in August 2011 that was completed for hx of polyps and was significant for hemorrhoids and otherwise negative.   On 08/31/16, pt underwent screening colonoscopy, which unfortunately showed 2 masses in the proximal and distant transverse colon, biopsy both showed moderately differentiated adenocarcinoma.   Pt had a CT Abdomen Pelvis that was ordered by [name] and completed on 09/01/16 and significant for colonic neoplasm and 2 indeterminate liver lesions suspicious for potential metastatic dx.   Pt had a CT Chest W Constrast that was ordered by [name] and completed on 09/06/16 and significant for no metastasis.    Pt had MR Liver W WO Contrast that was ordered by [name] and completed on 09/16/16 and significant for colonic adenocarcinoma and indeterminate liver lesions.  Pt notes that she was referred to the office by [name]. Pt has an appointment with Surgeon, [name] tomorrow, 8/3 to discuss her options. She notes that [name] recommended a liver biopsy. Pt states that she had 1 year ago she had one large episode of rectal bleeding following consuming a milkshake, 1 episode of emesis, and a large episode of rectal bleeding that resolved on its own. Denies recurrent bleeding  following initial rectal bleeding. Denies being evaluated last year for her rectal bleeding due to having issues with finding a GI specialist. Pt last colonoscopy was 7 years ago. Pt reports that due to her chronic constipation, she uses suppositories and OTC laxatives. She states that she eats mainly meat and starches due to her dislike for vegetables. She notes that she recently lost approximately 10 lbs over the past year. 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 She states that she lives with her husband and has 3 older children, 2 in [place] and 1 in [place]. Pt friend notes that the pt grandchildren intermittently help her out. She states that her husband has severe medical issues with internal bleeding and is currently on oxygen. Pt reports that she completes all of the housework and outside work due to her husband being unable to complete daily activities.   Pt has intermittent GERD that she used to take Nexium for. Denies PMHx of HTN, cardiac issues. Denies taking HTN medications. Denies MI, but notes that she was evaluated for CP symptoms and was found to have a 20% blockage in her arteries. Pt reports that she had a stroke with resolved residual right sided weakness summer 2017 that she was evaluated the next day at [facility] and was informed by her horse vet to be evaluated. Pt states that she took 2 ASA following  her initial symptoms. Pt reports that she takes ASA and plavix for. She states that she stopped taking her plavix 2 weeks ago following the colonoscopy. Pt sates that she takes Rx Lipitor for cholesterol. Pt reports that she takes multi-vitamin and that [name] recommended that she takes vitamin-D.   Pt had cholecystectomy and partial hysterectomy with one ovary left. Pt denies family hx of CA. Denies smoking cigarettes, but reports that her husband used to smoke cigarettes. Pt denies ETOH  use at this time.   On review of systems, pt denies fever, chills. Endorses 10 lb weight loss, decreased energy levels. Denies pain. Pt reports constipation treated with laxatives. Pt reports that she has hemorrhoids. Pt denies abdominal pain, nausea, vomiting, abdominal distension.   CURRENT THERAPY: Observation   INTERVAL HISTORY:  Donie Moulton is here for a follow up. She presents to the clinic today by herself. She notes she did a lot of walking this weekend and now her legs and feet have swollen. She finished Xeloda on 06/12/17 and is continuing to recover.  On review of systems, pt denies any other complaints at this time. Pertinent positives are listed and detailed within the above HPI.   MEDICAL HISTORY:  Past Medical History:  Diagnosis Date  . Anemia   . Arthritis   . Astigmatism    both eyes  . Cancer [facility])    colon surgery done last chemo tx 01-17-17  . Colon cancer ([place])   . Coronary artery disease, non-occlusive   . Dyspnea    more so exertion  . Dysrhythmia    skipped beats every now and then  . GERD (gastroesophageal reflux disease)   . Glaucoma    both eyes right eye worse than left  . Hammer toes of both feet   . Headache    migraines with aura  . Heel spur    both heels and front  . HSV (herpes simplex virus) infection   . Hyperglycemia   . Hyperlipidemia   . IBS (irritable bowel syndrome)   . IC (interstitial cystitis)   . Insomnia disorder related to known organic factor   . Osteoporosis   . PONV (postoperative nausea and vomiting)    has had more surgeries, and had no problems  . Stroke ([place]) 2016  . Vitamin D deficiency     SURGICAL HISTORY: Past Surgical History:  Procedure Laterality Date  . ABDOMINAL HYSTERECTOMY     1 ovary and womb removed  . CARDIAC CATHETERIZATION  7/12   non obst dz -- not sure what yr  . CHOLECYSTECTOMY    . EUS N/A 02/03/2017   Procedure: UPPER ENDOSCOPIC ULTRASOUND (EUS) LINEAR;  Surgeon: [name], MD;  Location: [facility];  Service: Endoscopy;  Laterality: N/A;  . EYE SURGERY Bilateral    ioc for cataracts   . LAPAROSCOPIC RIGHT COLECTOMY Right 11/10/2016   Procedure: LAPAROSCOPIC EXTENDED RIGHT COLECTOMY;  Surgeon: [name], MD;  Location: [place];  Service: General;  Laterality: Right;  . PORTACATH PLACEMENT Left 12/22/2016   Procedure: INSERTION PORT-A-CATH;  Surgeon: [name], MD;  Location: [place];  Service: General;  Laterality: Left;  . ROTATOR CUFF REPAIR Bilateral     SOCIAL HISTORY: Social History   Socioeconomic History  . Marital status: Married    Spouse name: Not on file  . Number of children: 4  . Years of education: Not on file  . Highest education level: Not on file  Occupational History  . Occupation: reitred    Fish farm manager: RETIRED  Social Needs  . Financial resource strain: Not on file  . Food insecurity:    Worry: Not on file    Inability: Not on file  . Transportation needs:    Medical: Not on file    Non-medical: Not on file  Tobacco Use  . Smoking status: Never Smoker  . Smokeless tobacco: Never Used  Substance and Sexual Activity  . Alcohol use: No    Alcohol/week: 0.0 oz  . Drug use: No  . Sexual activity: Not on file  Lifestyle  . Physical activity:    Days per week: Not on file    Minutes per session: Not on file  . Stress: Not on file  Relationships  . Social connections:    Talks on phone: Not on file    Gets together: Not on file    Attends religious service: Not on file    Active member of club or organization: Not on file    Attends meetings of clubs or organizations: Not on file    Relationship status: Not on file  . Intimate partner violence:    Fear of current or ex partner: Not on file    Emotionally abused: Not on file    Physically abused: Not on file    Forced sexual activity: Not on file  Other Topics Concern  . Not on file  Social History Narrative   Rides horses          Specialty providers    [facility]   Cardio- [facility]   Ortho--[place]   Rheum-[facility]   urol-[facility]   [facility]-- [facility]    FAMILY HISTORY: Family History  Problem Relation Age of Onset  . Alcohol abuse Mother   . Heart attack Mother   . GER disease Mother   . GER disease Father   . Alcohol abuse Father   . Stroke Father   . Tuberculosis Father   . Heart attack Father   . Aneurysm Father   . Mental illness Sister   . Depression Sister   . Breast cancer Neg Hx     ALLERGIES:  is allergic to salmon [fish allergy]; ampicillin; codeine; flagyl [metronidazole]; meperidine hcl; neomycin; penicillins; propoxyphene hcl; sulfa antibiotics; and tramadol hcl.  MEDICATIONS:  Current Outpatient Medications  Medication Sig Dispense Refill  . atorvastatin (LIPITOR) 40 MG tablet Take 1 tablet (40 mg total) by mouth daily at 6 PM. 90 tablet 3  . b complex vitamins tablet Take 1 tablet by mouth daily.    . bimatoprost (LUMIGAN) 0.01 % SOLN Place 1 drop into both eyes daily.    . Ca Carbonate-Mag Hydroxide (ROLAIDS PO) Take 1-2 tablets by mouth every 8 (eight) hours as needed (for acid reflux).     . Cholecalciferol (VITAMIN D) 2000 units tablet Take 2,000 Units by mouth daily.    Marland Kitchen ELIQUIS 5 MG TABS tablet TAKE 1 TABLET BY MOUTH TWICE A DAY 60 tablet 2  . famotidine (PEPCID) 20 MG tablet Take 1 tablet (20 mg total) by mouth at bedtime. 60 tablet 0  . gabapentin (NEURONTIN) 100 MG capsule TAKE 1 CAPSULE (100 MG TOTAL) BY MOUTH 2 (TWO) TIMES DAILY AS NEEDED. 30 capsule 2  . Ibuprofen-Diphenhydramine Cit (IBUPROFEN PM) 200-38 MG TABS Take 1 tablet by mouth daily.    Marland Kitchen lactose free nutrition (BOOST) LIQD Take 237 mLs by mouth daily.    Marland Kitchen lidocaine-prilocaine (EMLA) cream Apply to affected  area once 30 g 3  . Multiple Vitamin (MULTIVITAMIN WITH MINERALS) TABS tablet Take 1 tablet by mouth daily.    . ondansetron (ZOFRAN) 8 MG tablet Take 1 tablet (8 mg total) by mouth 2 (two) times daily as  needed for refractory nausea / vomiting. Start on day 3 after chemotherapy. 30 tablet 1  . polyethylene glycol (MIRALAX / GLYCOLAX) packet Take 17 g by mouth daily. 14 each 0  . potassium chloride SA (K-DUR,KLOR-CON) 20 MEQ tablet Take 1 tablet (20 mEq total) by mouth daily. 30 tablet 1  . prochlorperazine (COMPAZINE) 10 MG tablet Take 1 tablet (10 mg total) by mouth every 6 (six) hours as needed (Nausea or vomiting). 30 tablet 1  . capecitabine (XELODA) 500 MG tablet Take 3 tabs (1566m) twice daily within 331m after food, for 2 weeks on, 1 week off. Pt has 10 tabs left from last cycle (Patient not taking: Reported on 06/20/2017) 74 tablet 0  . COMBIGAN 0.2-0.5 % ophthalmic solution      No current facility-administered medications for this visit.     REVIEW OF SYSTEMS:  Constitutional: Denies fevers, chills or abnormal night sweats   Eyes: Denies blurriness of vision, double vision or watery eyes Ears, nose, mouth, throat, and face: Denies mucositis or sore throat (+) hard of Hearing Respiratory: Denies cough, dyspnea or wheezes Cardiovascular: Denies palpitation, chest discomfort (+) lower extremity swelling Gastrointestinal:  Denies nausea, heartburn (+) vomiting once from Xeloda   Skin: Denies abnormal skin rashes Lymphatics: Denies new lymphadenopathy or easy bruising Neurological:Denies numbness, tingling or new weaknesses Behavioral/Psych: Mood is stable, no new changes  All other systems were reviewed with the patient and are negative.  PHYSICAL EXAMINATION: ECOG PERFORMANCE STATUS: 2  Vitals:   06/20/17 1154  BP: (!) 142/66  Pulse: 67  Resp: 18  Temp: 98.6 F (37 C)  SpO2: 100%   Filed Weights   06/20/17 1154  Weight: 152 lb 8 oz (69.2 kg)     GENERAL:alert, no distress and comfortable SKIN: skin color, texture, turgor are normal, no rashes or significant lesions EYES: normal, conjunctiva are pink and non-injected, sclera clear OROPHARYNX:no exudate, no erythema  and lips, buccal mucosa, and tongue normal  NECK: supple, thyroid normal size, non-tender, without nodularity LYMPH:  no palpable lymphadenopathy in the cervical, axillary or inguinal LUNGS: clear to auscultation and percussion with normal breathing effort HEART: regular rate & rhythm and no murmurs and no lower extremity edema ABDOMEN:abdomen soft, non-tender and normal bowel sounds. No organomegaly noted. (+) midline incision above umbilical , well healed, non-tender, no scar tissue.  Musculoskeletal:no cyanosis of digits and no clubbing  PSYCH: alert & oriented x 3 with fluent speech NEURO: no focal motor/sensory deficits   LABORATORY DATA:  I have reviewed the data as listed CBC Latest Ref Rng & Units 06/20/2017 05/16/2017 04/25/2017  WBC 3.9 - 10.3 K/uL 5.2 7.0 6.6  Hemoglobin 11.6 - 15.9 g/dL 10.3(L) 10.9(L) 11.2(L)  Hematocrit 34.8 - 46.6 % 30.7(L) 31.7(L) 33.5(L)  Platelets 145 - 400 K/uL 190 248 139(L)    CMP Latest Ref Rng & Units 06/20/2017 05/16/2017 04/25/2017  Glucose 70 - 140 mg/dL 95 96 107  BUN 7 - 26 mg/dL _0 Creatinine 0.60 - 1.10 mg/dL 0.81 0.88 0.94  Sodium 136 - 145 mmol/L 140 141 141  Potassium 3.5 - 5.1 mmol/L 3.6 2.9(LL) 3.6  Chloride 98 - 109 mmol/L 107 106 107  CO2 22 - 29 mmol/L 26 27 25  Calcium 8.4 - 10.4 mg/dL 9.4 9.6 9.7  Total Protein 6.4 - 8.3 g/dL 6.4 6.5 6.6  Total Bilirubin 0.2 - 1.2 mg/dL 1.0 1.0 0.4  Alkaline Phos 40 - 150 U/L 90 93 100  AST 5 - 34 U/L _0 ALT 0 - 55 U/L 19 15 35    CEA 08/31/16: 2.0   PATHOLOGY REPORT:  Diagnosis 11/10/16 Colon, segmental resection for tumor, Ascending and Transverse ADENOCARCINOMA OF THE ASCENDING COLON WITH EXTRA CELLULAR MUCIN, GRADE 3, SIZE 3.4 CM THE TUMOR INVADES MUSCULARIS PROPRIA POORLY DIFFERENTIATED ADENOCARCINOMA OF THE TRANSVERSE COLON, GRADE 4, SIZE 6.1 CM THE TUMOR INVADES THROUGH MUSCULARIS PROPRIA INTO PERICOLONIC SOFT TISSUE EXTRAMURAL SATELLITE TUMOR NODULES IS IDENTIFIED  (X1) ALL MARGINS OF RESECTION ARE NEGATIVE FOR CARCINOMA METASTATIC CARCINOMA IN ONE OF SIXTY LYMPH NODES (1/60) TABULAR ADENOMA X1 Microscopic Comment COLON AND RECTUM (INCLUDING TRANS-ANAL RESECTION): Specimen: Right and transverse colon Procedure: Segmental resection Tumor site: Ascending and transverse colon Specimen integrity: Intact Macroscopic intactness of mesorectum: Not applicable: X Complete: NA Near complete: NA Incomplete: NA Cannot be determined (specify): NA Macroscopic tumor perforation: Pericolonic soft tissue Invasive tumor: Maximum size: 6.1 cm and 3.4 cm Histologic type(s): Adenocarcinoma Histologic grade and differentiation: G1: well differentiated/low grade Microscopic Comment(continued) G2: moderately differentiated/low grade G3: poorly differentiated/high grade G4: undifferentiated/high grade Type of polyp in which invasive carcinoma arose: Tubular adenoma Microscopic extension of invasive tumor: Pericolonic soft tissue Lymph-Vascular invasion: Identified Peri-neural invasion: Negative Tumor deposit(s) (discontinuous extramural extension): Present Resection margins: Proximal margin: Negative Distal margin: Negative Circumferential (radial) (posterior ascending, posterior descending; lateral and posterior mid-rectum; and entire lower 1/3 rectum): Negative Mesenteric margin (sigmoid and transverse): Negative Distance closest margin (if all above margins negative): 1.5 cm from the mesenteric margin Trans-anal resection margins only: Deep margin: NA Mucosal Margin: NA Distance closest mucosal margin (if negative): NA Treatment effect (neo-adjuvant therapy): Negative Additional polyp(s): Tubular adenoma Non-neoplastic findings: Unremarkble Lymph nodes: number examined 60; number positive: 1 Pathologic Staging: pT3 (m), pN1c, pMx Ancillary studies: Ordered    Colon biopsy, 08/31/16 Diagnosis 1. Surgical [P], hepatic flexure - ADENOCARCINOMA.  Moderately differentiated.  2. Surgical [P], transverse - ADENOCARCINOMA, Moderately differentiated.  RADIOGRAPHIC STUDIES: I have personally reviewed the radiological images as listed and agreed with the findings in the report.  CT CAP W Contrast 06/13/17 IMPRESSION: 1. Continued regression of hepatic metastatic disease. No new or progressive findings. 2. Surgical changes from a right hemicolectomy. No findings for recurrent cancer. No mesenteric or retroperitoneal mass or Adenopathy.  Ct Abdomen Pelvis W Contrast  Result Date: 06/13/2017 CLINICAL DATA:  Restaging colon cancer EXAM: CT ABDOMEN AND PELVIS WITH CONTRAST TECHNIQUE: Multidetector CT imaging of the abdomen and pelvis was performed using the standard protocol following bolus administration of intravenous contrast. CONTRAST:  127m OMNIPAQUE IOHEXOL 300 MG/ML  SOLN COMPARISON:  03/09/2017 FINDINGS: Lower chest: The lung bases are clear of an acute process. No worrisome pulmonary lesions or pleural effusion. The heart is normal in size. No pericardial effusion. There is a small to moderate-sized hiatal hernia. Hepatobiliary: Continued regression of hepatic metastatic disease. Mostly lesions are very difficult to see on this examination. The segment 5 lesion measures approximately 16.5 x 7.5 mm and previously measured 20 x 11.5 mm. No new hepatic lesions. No intrahepatic biliary dilatation. The gallbladder surgically absent. Pancreas: No mass, inflammation or ductal dilatation. Spleen: Normal size.  No focal lesions. Adrenals/Urinary Tract: The adrenal glands and kidneys are unremarkable. Stable left-sided parapelvic renal cysts. No  worrisome renal lesions. Small cysts are noted. Stomach/Bowel: The stomach, duodenum, small bowel and colon are unremarkable. No acute inflammatory changes, mass lesions or obstructive findings. Stable surgical changes from a right hemicolectomy. Vascular/Lymphatic: The aorta and branch vessels are patent. Stable  atherosclerotic calcifications. The major venous structures are patent. No mesenteric or retroperitoneal mass or adenopathy. Reproductive: Surgically absent. Other: Small cystocele. No pelvic mass or adenopathy. No inguinal mass or adenopathy. Musculoskeletal: No significant bony findings. IMPRESSION: 1. Continued regression of hepatic metastatic disease. No new or progressive findings. 2. Surgical changes from a right hemicolectomy. No findings for recurrent cancer. No mesenteric or retroperitoneal mass or adenopathy. Electronically Signed   By: Marijo Sanes M.D.   On: 06/13/2017 15:00    PROCEDURES   COLONOSCOPY 08/31/2016 DR. JACOBS  - 1) Polypoid mass at the hepatic flexure, 2cm, soft but clearly neoplastic. This was biopsied and the site was injected with Spot. - 2) Clearly malignant mass in the distal transverse colon; 4cm, firm, 1/2 cirumference, ulcerated. This was biopsied and the site was also injected with Spot. - 3) Four small (3-74m) scattered typical appearing adenomas throughout the colon, not removed since she mistakenly stayed on her plavix for this procedure.  ASSESSMENT & PLAN:   VRylyn Zawistowski is a 77y.o. caucasian female with colorectal adenocarcinoma, moderately differentiated, found on screening colonoscopy.   1. Synchronized colon adenocarcinoma, moderately differentiated, in hepatic fracture and distal transverse colon, pT3(m)N1cMx with indeterminate liver lesions, at least stage IIIB, MSI-H -I have reviewed her colonoscopy, biopsy and CT and MRI image findings with patient and her friend in great details. -She has to synchronized colon mass in the hepatic flexure and distal transverse colon, post biopsy showed moderately-differentiated adenocarcinoma -CT and abdominal MRI showed 2 lesions in right lobe of liver, at least one lesion is very suspicious for metastatic disease, and enlarged periportal lymph nodes.  --We discussed 10/04/16 PET. There is no  definitive distant metastasis on the PET scan. There was no uptake in her liver, liver metastasis is less likely, although not ruled out. There is a hypermetabolic lymph node in the portacaval region measuring 1.5 cm with SUV 16.7, compatible with malignancy. --12/28/16 MRI of abdomen revealed no significant change in liver lesions, but noted slightly enlarged periportal lymphadenopathy from previous scan, suspicious for nodal metastasis. -I previously recommend EUS biopsy of the portacaval lymph node, which was negative  -She has been on chemotherapy FOLFOX, it has become more difficult for her continue treatment, due to side effects.  -She previously requested to stop IV chemo because of financial burden and because she wants to get back to her life and her horses. Since metastatic disease is not ruled out, I recommend her to continue chemotherapy a little longer, for at least a total of 6 months. FOLFOX was switched to Xeloda -Although she is tolerating Xeloda well overall, she does have mild side effects, and one episode of vomiting, she is very reluctant to continue chemo for much longer.  She agrees to do 1 more cycle, then stop. - Plan to start next cycle on 05/30/17 She can decrease her dose to 1500 mg BID. She finished a total of 6 month adjuvant chemo on 06/12/17 -Down the road, if she has further disease progression, or intolerance to chemo, we can consider immunotherapy with Keytruda due to her MSI high disease. -CT CAP W Contrast from 06/13/17 revealed continued regression of hepatic lesions, these are concerning for metastatic disease.  -she will start observation  now, we discussed the follow-up with primary, plan to see her every 3 to 4 months with separating scans.-Repeat CT CAP W Contrast in 3 months  -F/u after scan.     2. 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 I previously recommend she sees a Dietitian to rule out lynch syndrome. She agreed to see  genetics  -Had genetics apt on 1/2, she consented to genetics testing -genetic testing were negative for Lynch Syndrome   3. 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 Liver lesions -Her initial staging CT scan showed numerous small liver lesions, indeterminate, those are not hypermetabolic on PET scan. -Previously enlarged portal node was negative for metastasis, but also appears to have shrunk while on chemotherapy -CT CAP W Contrast from 06/13/17 revealed continued regression of hepatic metastatic disease  5. History of stroke  -No residual neuro deficits. She functions well at home. -she is on aspirin and Plavix   6. 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    Orders Placed This Encounter  Procedures  . CT Abdomen Pelvis W Contrast    Standing Status:   Future    Standing Expiration Date:   06/20/2018    Order Specific Question:   If indicated for the ordered procedure, I authorize the administration of contrast media per Radiology protocol    Answer:   Yes    Order Specific Question:   [facility]    [facility]   Order Specific Question:   Is Oral Contrast requested for this exam?    Answer:   Per Radiology protocol    Order Specific Question:   Radiology Contrast Protocol - do NOT remove  file path    Answer:   _0 [email or web address]    All questions were answered. The patient knows to call the clinic with any problems, questions or concerns.  I spent 20 minutes counseling the patient face to face. The total time spent in the appointment was 25 minutes and more than 50% was on counseling.  This document serves as a record of services personally performed by YTruitt [name] MD. It was created on her behalf by DTheresia Bough a trained medical scribe. The creation of this record is based on the scribe's personal observations and the provider's statements to them.   I have reviewed the above documentation for accuracy and completeness, and I agree with the above.    YTruitt [name] MD 06/20/2017

## 2017-06-20 ENCOUNTER — Encounter: Payer: Self-pay | Admitting: Hematology

## 2017-06-20 ENCOUNTER — Telehealth: Payer: Self-pay | Admitting: Hematology

## 2017-06-20 ENCOUNTER — Inpatient Hospital Stay (HOSPITAL_BASED_OUTPATIENT_CLINIC_OR_DEPARTMENT_OTHER): Payer: Medicare HMO | Admitting: Hematology

## 2017-06-20 ENCOUNTER — Inpatient Hospital Stay: Payer: Medicare HMO | Attending: Nurse Practitioner

## 2017-06-20 ENCOUNTER — Inpatient Hospital Stay: Payer: Medicare HMO

## 2017-06-20 VITALS — BP 142/66 | HR 67 | Temp 98.6°F | Resp 18 | Ht 64.0 in | Wt 152.5 lb

## 2017-06-20 DIAGNOSIS — C19 Malignant neoplasm of rectosigmoid junction: Secondary | ICD-10-CM | POA: Insufficient documentation

## 2017-06-20 DIAGNOSIS — C189 Malignant neoplasm of colon, unspecified: Secondary | ICD-10-CM

## 2017-06-20 DIAGNOSIS — I10 Essential (primary) hypertension: Secondary | ICD-10-CM

## 2017-06-20 DIAGNOSIS — C787 Secondary malignant neoplasm of liver and intrahepatic bile duct: Secondary | ICD-10-CM | POA: Diagnosis not present

## 2017-06-20 DIAGNOSIS — Z79899 Other long term (current) drug therapy: Secondary | ICD-10-CM | POA: Insufficient documentation

## 2017-06-20 DIAGNOSIS — Z7901 Long term (current) use of anticoagulants: Secondary | ICD-10-CM | POA: Diagnosis not present

## 2017-06-20 DIAGNOSIS — D500 Iron deficiency anemia secondary to blood loss (chronic): Secondary | ICD-10-CM

## 2017-06-20 DIAGNOSIS — D6481 Anemia due to antineoplastic chemotherapy: Secondary | ICD-10-CM | POA: Insufficient documentation

## 2017-06-20 DIAGNOSIS — Z8673 Personal history of transient ischemic attack (TIA), and cerebral infarction without residual deficits: Secondary | ICD-10-CM | POA: Insufficient documentation

## 2017-06-20 DIAGNOSIS — C188 Malignant neoplasm of overlapping sites of colon: Secondary | ICD-10-CM

## 2017-06-20 LAB — COMPREHENSIVE METABOLIC PANEL
ALT: 19 U/L (ref 0–55)
AST: 21 U/L (ref 5–34)
Albumin: 3.8 g/dL (ref 3.5–5.0)
Alkaline Phosphatase: 90 U/L (ref 40–150)
Anion gap: 7 (ref 3–11)
BUN: 22 mg/dL (ref 7–26)
CO2: 26 mmol/L (ref 22–29)
Calcium: 9.4 mg/dL (ref 8.4–10.4)
Chloride: 107 mmol/L (ref 98–109)
Creatinine, Ser: 0.81 mg/dL (ref 0.60–1.10)
GFR calc Af Amer: >60 mL/min (ref >60)
GFR calc non Af Amer: >60 mL/min (ref >60)
Glucose, Bld: 95 mg/dL (ref 70–140)
Potassium: 3.6 mmol/L (ref 3.5–5.1)
Sodium: 140 mmol/L (ref 136–145)
Total Bilirubin: 1 mg/dL (ref 0.2–1.2)
Total Protein: 6.4 g/dL (ref 6.4–8.3)

## 2017-06-20 LAB — CBC WITH DIFFERENTIAL (CANCER CENTER ONLY)
Basophils Absolute: 0 10*3/uL (ref 0.0–0.1)
Basophils Relative: 0 %
Eosinophils Absolute: 0.1 10*3/uL (ref 0.0–0.5)
Eosinophils Relative: 1 %
HCT: 30.7 % — ABNORMAL LOW (ref 34.8–46.6)
Hemoglobin: 10.3 g/dL — ABNORMAL LOW (ref 11.6–15.9)
Lymphocytes Relative: 34 %
Lymphs Abs: 1.8 10*3/uL (ref 0.9–3.3)
MCH: 34.9 pg — ABNORMAL HIGH (ref 25.1–34.0)
MCHC: 33.6 g/dL (ref 31.5–36.0)
MCV: 103.8 fL — ABNORMAL HIGH (ref 79.5–101.0)
Monocytes Absolute: 0.7 10*3/uL (ref 0.1–0.9)
Monocytes Relative: 13 %
Neutro Abs: 2.7 10*3/uL (ref 1.5–6.5)
Neutrophils Relative %: 52 %
Platelet Count: 190 10*3/uL (ref 145–400)
RBC: 2.96 MIL/uL — ABNORMAL LOW (ref 3.70–5.45)
RDW: 17.9 % — ABNORMAL HIGH (ref 11.2–14.5)
WBC Count: 5.2 10*3/uL (ref 3.9–10.3)

## 2017-06-20 LAB — RETICULOCYTES
RBC.: 2.98 MIL/uL — ABNORMAL LOW (ref 3.70–5.45)
Retic Count, Absolute: 128.1 10*3/uL — ABNORMAL HIGH (ref 33.7–90.7)
Retic Ct Pct: 4.3 % — ABNORMAL HIGH (ref 0.7–2.1)

## 2017-06-20 LAB — IRON AND TIBC
Iron: 83 ug/dL (ref 41–142)
Saturation Ratios: 25 % (ref 21–57)
TIBC: 326 ug/dL (ref 236–444)
UIBC: 243 ug/dL

## 2017-06-20 LAB — FERRITIN: Ferritin: 129 ng/mL (ref 9–269)

## 2017-06-20 MED ORDER — HEPARIN SOD (PORK) LOCK FLUSH 100 UNIT/ML IV SOLN
500.0000 [IU] | Freq: Once | INTRAVENOUS | Status: AC
  Administered 2017-06-20: 500 [IU]
  Filled 2017-06-20: qty 5

## 2017-06-20 MED ORDER — SODIUM CHLORIDE 0.9 % IJ SOLN
10.0000 mL | Freq: Once | INTRAMUSCULAR | Status: AC
  Administered 2017-06-20: 10 mL
  Filled 2017-06-20: qty 10

## 2017-06-20 NOTE — Telephone Encounter (Signed)
Scheduled appt per 4/29 los - Gave patient AVS and calender per los.  

## 2017-06-21 ENCOUNTER — Other Ambulatory Visit: Payer: Self-pay | Admitting: Pharmacist

## 2017-06-22 ENCOUNTER — Telehealth: Payer: Self-pay | Admitting: *Deleted

## 2017-06-22 NOTE — Telephone Encounter (Signed)
This RN spoke with the patient per her call inquiring if she could use ducolax instead of miralax.   She states her last BM was this past Saturday and was soft with some diarrhea looseness.  She has been taking 2 tablespoons miralax mixed in water daily with no benefit.  She is passing gas and does not feel bloated or cramping.  [name] states prior to cancer diagnosis she only had BM's weekly and had issues with constipation.  While on the xeloda she did have very loose stools - and now she has completed xeloda.  Per phone discussion this RN discussed she can take miralax twice a day - for benefit - or take 1 dose of miralax and then tonight proceed with use of ducolax tablet.  Per phone discussion with this RN and issues above- pt verbalized understanding of need for BM and will call this office tomorrow with update.

## 2017-06-27 ENCOUNTER — Telehealth: Payer: Self-pay

## 2017-06-27 DIAGNOSIS — C184 Malignant neoplasm of transverse colon: Secondary | ICD-10-CM | POA: Diagnosis not present

## 2017-06-27 DIAGNOSIS — Z1231 Encounter for screening mammogram for malignant neoplasm of breast: Secondary | ICD-10-CM | POA: Diagnosis not present

## 2017-06-27 DIAGNOSIS — R195 Other fecal abnormalities: Secondary | ICD-10-CM | POA: Diagnosis not present

## 2017-06-27 NOTE — Telephone Encounter (Signed)
-----   Message from [name], MD sent at 06/26/2017 11:57 AM EDT ----- Please let pt know that her iron level was good, thanks  [name]  06/26/2017

## 2017-06-27 NOTE — Telephone Encounter (Signed)
Called patient notified per Dr. Burr Medico iron level is good, patient verbalized an understanding.

## 2017-06-29 ENCOUNTER — Telehealth: Payer: Self-pay

## 2017-06-29 NOTE — Telephone Encounter (Signed)
Left message on machine to call back  

## 2017-06-29 NOTE — Telephone Encounter (Signed)
-----   Message from [name], MD sent at 06/29/2017  8:03 AM EDT ----- Dorris Fetch, Thanks. We'll get her in to discuss.    Yohana Bartha, She needs soon ROV with me, if none available within 2-3 weeks then with extender please. She has personal history of colon cancer with a recent change in bowels. On plavix.  Thanks  ----- Message ----- From: [name], MD Sent: 06/27/2017  12:14 PM To: [name], MD  Might pull trigger a little early.  She is having some narrow stools again.  Probably nothing, but... fb

## 2017-06-30 NOTE — Telephone Encounter (Signed)
Left message on machine to call back  

## 2017-07-01 NOTE — Telephone Encounter (Signed)
Left message on machine to call back  

## 2017-07-05 NOTE — Telephone Encounter (Signed)
The pt has been advised of the appt with Amy on 07/27/17.

## 2017-07-16 DIAGNOSIS — Z0100 Encounter for examination of eyes and vision without abnormal findings: Secondary | ICD-10-CM | POA: Diagnosis not present

## 2017-07-26 ENCOUNTER — Telehealth: Payer: Self-pay | Admitting: Family Medicine

## 2017-07-26 ENCOUNTER — Ambulatory Visit: Payer: Medicare HMO | Admitting: Physician Assistant

## 2017-07-26 DIAGNOSIS — R7309 Other abnormal glucose: Secondary | ICD-10-CM

## 2017-07-26 DIAGNOSIS — E7800 Pure hypercholesterolemia, unspecified: Secondary | ICD-10-CM

## 2017-07-26 DIAGNOSIS — E559 Vitamin D deficiency, unspecified: Secondary | ICD-10-CM

## 2017-07-26 DIAGNOSIS — M810 Age-related osteoporosis without current pathological fracture: Secondary | ICD-10-CM

## 2017-07-26 DIAGNOSIS — I10 Essential (primary) hypertension: Secondary | ICD-10-CM

## 2017-07-26 DIAGNOSIS — D500 Iron deficiency anemia secondary to blood loss (chronic): Secondary | ICD-10-CM

## 2017-07-26 NOTE — Telephone Encounter (Signed)
-----   Message from Eustace Pen, LPN sent at 05/26/6284  4:42 PM EDT ----- Regarding: Labs 6/6 Lab orders needed. Thank you.  Insurance:  [employer]  If no labs needed, please advise. Thank you.

## 2017-07-27 ENCOUNTER — Telehealth: Payer: Self-pay | Admitting: *Deleted

## 2017-07-27 ENCOUNTER — Ambulatory Visit: Payer: Medicare HMO | Admitting: Physician Assistant

## 2017-07-27 ENCOUNTER — Encounter: Payer: Self-pay | Admitting: Physician Assistant

## 2017-07-27 VITALS — BP 90/64 | HR 72 | Ht 64.0 in | Wt 149.4 lb

## 2017-07-27 DIAGNOSIS — Z85038 Personal history of other malignant neoplasm of large intestine: Secondary | ICD-10-CM

## 2017-07-27 DIAGNOSIS — R194 Change in bowel habit: Secondary | ICD-10-CM | POA: Diagnosis not present

## 2017-07-27 MED ORDER — PEG 3350-KCL-NA BICARB-NACL 420 G PO SOLR: ORAL | 0 refills | Status: DC

## 2017-07-27 NOTE — Telephone Encounter (Signed)
  07/27/2017   RE: Yoneko Talerico DOB: April 05, 1940 MRN: [id number]   Dear Dr. [name],    We have scheduled the above patient for an endoscopic procedure. Our records show that she is on anticoagulation therapy.   Please advise as to how long the patient may come off her therapy of Eliquis prior to the procedure, which is scheduled for 10-18-2017.  Please  route the Eliquis clearance instructions to [facility] CMA.   Sincerely,    Amy Esterwood PA-C

## 2017-07-27 NOTE — Telephone Encounter (Signed)
Yes, OK to hold Eliquis for 1-2 days before her procedure, and restart the day after procedure. Thanks   [name] MD

## 2017-07-27 NOTE — Progress Notes (Signed)
Subjective:    Patient ID: [name], female    DOB: [birth year], 77 y.o.   MRN: [id number]  HPI [name] is a pleasant 77 year old white female known to [name], with history of adenocarcinoma of the colon initially diagnosed in July 2018 at colonoscopy.  She was found to have a 2 cm polypoid mass at the hepatic flexure and a 4 cm mass in the distal transverse colon.  She also had 4 other adenomatous polyps which were not removed as she had mistakenly remained on Plavix. Patient is referred back today by Dr. Burr Medico with change in bowel habits. Patient was diagnosed with synchronous colon cancer stage IIIb.  She underwent laparoscopic extended right colectomy by [name] in September 2018. She was found by imaging prior to surgery to have liver metastases. She underwent chemotherapy with FOLFOX which she completed in February 2019. She had upper EUS in December 2018, to further evaluate.  Peri- portal soft tissue mass.  She was noted to have been enlarged lymph node in the porta hepatis measuring 16 mm poorly defined margins.  This was biopsied, and biopsy showed no malignant cells. Patient was started on Xeloda in March 2019. Most recent CT scan was done 06/13/2017 which showed continued regression of the hepatic metastatic disease no new or progressive findings and no colonic findings concerning for recurrent cancer, no mesenteric or retroperitoneal mass or adenopathy. She is currently on Eliquis.  She was diagnosed with a left lower extremity DVT postoperatively and then on CT imaging after that was found to have portal vein and SMV thrombus.  There was no evidence of persistent thrombus on this most recent CT. She had expressed that in recent visit that she had noticed some changes in her stools.  She says she always had had pasty type stools since her surgery but more recently had noticed some narrow thinner stools.  She says this is been somewhat intermittent.  She denies any  abdominal pain.  She has not noticed any blood.  Appetite has been okay and weight has been stable. Oncology plans to do follow-up CT imaging at a 79-month interval.    Review of Systems Pertinent positive and negative review of systems were noted in the above HPI section.  All other review of systems was otherwise negative.  Outpatient Encounter Medications as of 07/27/2017  Medication Sig  . atorvastatin (LIPITOR) 40 MG tablet Take 1 tablet (40 mg total) by mouth daily at 6 PM.  . b complex vitamins tablet Take 1 tablet by mouth daily.  . bimatoprost (LUMIGAN) 0.01 % SOLN Place 1 drop into both eyes daily.  . Cholecalciferol (VITAMIN D) 2000 units tablet Take 2,000 Units by mouth daily.  . COMBIGAN 0.2-0.5 % ophthalmic solution   . ELIQUIS 5 MG TABS tablet TAKE 1 TABLET BY MOUTH TWICE A DAY  . gabapentin (NEURONTIN) 100 MG capsule TAKE 1 CAPSULE (100 MG TOTAL) BY MOUTH 2 (TWO) TIMES DAILY AS NEEDED.  Marland Kitchen Ibuprofen-Diphenhydramine Cit (IBUPROFEN PM) 200-38 MG TABS Take 1 tablet by mouth daily.  Marland Kitchen lactose free nutrition (BOOST) LIQD Take 237 mLs by mouth daily.  Marland Kitchen lidocaine-prilocaine (EMLA) cream Apply to affected area once  . Multiple Vitamin (MULTIVITAMIN WITH MINERALS) TABS tablet Take 1 tablet by mouth daily.  . ondansetron (ZOFRAN) 8 MG tablet Take 1 tablet (8 mg total) by mouth 2 (two) times daily as needed for refractory nausea / vomiting. Start on day 3 after chemotherapy.  . polyethylene glycol (MIRALAX /  GLYCOLAX) packet Take 17 g by mouth daily.  . polyethylene glycol-electrolytes (NULYTELY/GOLYTELY) 420 g solution Take as directed for colonoscopy.  . potassium chloride SA (K-DUR,KLOR-CON) 20 MEQ tablet Take 1 tablet (20 mEq total) by mouth daily.  . [DISCONTINUED] Ca Carbonate-Mag Hydroxide (ROLAIDS PO) Take 1-2 tablets by mouth every 8 (eight) hours as needed (for acid reflux).   . [DISCONTINUED] famotidine (PEPCID) 20 MG tablet Take 1 tablet (20 mg total) by mouth at bedtime.  .  [DISCONTINUED] prochlorperazine (COMPAZINE) 10 MG tablet Take 1 tablet (10 mg total) by mouth every 6 (six) hours as needed (Nausea or vomiting).   No facility-administered encounter medications on file as of 07/27/2017.    Allergies  Allergen Reactions  . Salmon [Fish Allergy] Anaphylaxis    This happens with "CANNED SALMON" only, if eaten uncooked  . Ampicillin Nausea Only    Has patient had a PCN reaction causing immediate rash, facial/tongue/throat swelling, SOB or lightheadedness with hypotension: No Has patient had a PCN reaction causing severe rash involving mucus membranes or skin necrosis: No Has patient had a PCN reaction that required hospitalization: No Has patient had a PCN reaction occurring within the last 10 years: No If all of the above answers are "NO", then may proceed with Cephalosporin use.   . Codeine Nausea And Vomiting  . Flagyl [Metronidazole] Nausea And Vomiting    Patient was taking this along with Neomycin and could not differentiate which med triggered the nausea & vomiting  . Meperidine Hcl Nausea And Vomiting  . Neomycin Nausea And Vomiting    Patient was taking this along with Flagyl and could not differentiate which med triggered the nausea & vomiting  . Penicillins Other (See Comments)    Has patient had a PCN reaction causing immediate rash, facial/tongue/throat swelling, SOB or lightheadedness with hypotension: Unknown Has patient had a PCN reaction causing severe rash involving mucus membranes or skin necrosis: No Has patient had a PCN reaction that required hospitalization: No Has patient had a PCN reaction occurring within the last 10 years: No If all of the above answers are "NO", then may proceed with Cephalosporin use.   Marland Kitchen Propoxyphene Hcl Nausea And Vomiting  . Sulfa Antibiotics Hives  . Tramadol Hcl Nausea And Vomiting   Patient Active Problem List   Diagnosis Date Noted  . Genetic testing 04/25/2017  . Mouth ulcer 04/05/2017  . Adenopathy    . Acute deep vein thrombosis (DVT) of proximal vein of left lower extremity ([place]) 11/24/2016  . Cancer of overlapping sites of colon ([place]) 09/23/2016  . Iron deficiency anemia due to chronic blood loss 09/23/2016  . Estrogen deficiency 07/30/2016  . Screening mammogram, encounter for 07/30/2016  . Routine general medical examination at a health care facility 07/27/2016  . Right buttock pain 06/27/2015  . Accidental fall 06/27/2015  . Left hip pain 06/27/2015  . Stroke (cerebrum) ([place]) 05/06/2015  . HTN (hypertension) 05/06/2015  . HLD (hyperlipidemia) 05/06/2015  . GERD (gastroesophageal reflux disease) 05/06/2015  . CAD (coronary artery disease) 05/06/2015  . Insect bites 09/17/2014  . Joint pain 05/01/2013  . Pain in joint, ankle and foot 03/02/2013  . Seborrheic keratosis 07/05/2012  . Ankle joint pain 07/16/2011  . 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 Callus of foot 10/16/2010  . Coronary artery disease, non-occlusive 10/16/2010  . INSOMNIA 02/02/2010  . HAND PAIN, BILATERAL 01/09/2010  . CRAMP OF LIMB 01/09/2010  . HYPERGLYCEMIA 07/29/2009  . ALLERGIC RHINITIS 12/04/2008  . Vitamin D deficiency  04/30/2008  . COLONIC POLYPS 06/28/2007  . IBS 03/17/2007  . INTERSTITIAL CYSTITIS 03/17/2007  . Osteoporosis 03/17/2007     [name] family history includes Alcohol abuse in her father and mother; Aneurysm in her father; Depression in her sister; GER disease in her father and mother; Heart attack in her father and mother; Mental illness in her sister; Stroke in her father; Tuberculosis in her father.      Objective:    Vitals:   07/27/17 1344  BP: 90/64  Pulse: 72    Physical Exam; well-developed elderly white female in no acute distress, blood pressure 90/64 pulse 72, height 5 foot 4, weight 149, BMI 25.6.  HEENT; nontraumatic normocephalic EOMI PERRLA sclera anicteric, Oropharynx clear, Cardiovascular; regular rate and rhythm with S1-S2, Pulmonary; clear bilaterally,  Abdomen; soft, nontender, nondistended bowel sounds are active, midline incisional scar, there is some mild fullness in the right upper quadrant but no definite hepato-megaly.  Rectal; exam not done, Extremities; no clubbing cyanosis or edema skin warm and dry, Neuro psych; alert and oriented, grossly nonfocal mood and affect appropriate       Assessment & Plan:   #20 77 year old white female with stage IIIb adenocarcinoma of the colon with synchronous colon lesions found at the time of initial colonoscopy July 2018. Patient has undergone extended right hemicolectomy, and chemotherapy. Most recent CT imaging April 2019 showing continued regression of hepatic metastases and no evidence for recurrent colon mass, no mesenteric or retroperitoneal adenopathy. Patient has had some recent subtle changes in her bowel habits, and is also due for 1 year interval aloe up colonoscopy.  #2 patient had 4 adenomatous colon polyps not removed at the time of initial colonoscopy as she had inadvertently remained on antiplatelet therapy.  #3 chronic anticoagulation-currently on Eliquis #4 postop DVT, and also diagnosed with portal vein and SMV thrombus January 2019 #5 coronary artery disease 6.  History of CVA 7.  GERD 8.  Hypertension  Plan; Long discussion with patient today. Patient will be scheduled for colonoscopy with [name].  Procedure was discussed in detail with patient including indications risks and benefits and she is agreeable to proceed. Patient will need to hold Eliquis for 2 days prior to colonoscopy.  We will communicate with Dr. Burr Medico to assure that this is reasonable for this patient.  Omolola Mittman S Delila Kuklinski PA-C 07/27/2017   Cc: Tower, [name], MD

## 2017-07-27 NOTE — Patient Instructions (Signed)
If you are age 79 or older, your body mass index should be between 23-30. Your Body mass index is 25.64 kg/m. If this is out of the aforementioned range listed, please consider follow up with your Primary Care Provider.  You have been scheduled for a colonoscopy. Please follow written instructions given to you at your visit today.  Please pick up your prep supplies at the pharmacy within the next 1-3 days. If you use inhalers (even only as needed), please bring them with you on the day of your procedure.  We will call you with the Eliquis directions from Dr. Burr Medico.  You have been scheduled for a colonoscopy. Please follow written instructions given to you at your visit today.  Please pick up your prep supplies at the pharmacy within the next 1-3 days. If you use inhalers (even only as needed), please bring them with you on the day of your procedure.

## 2017-07-27 NOTE — Telephone Encounter (Deleted)
/  06/2017   RE: Bianney Rockwood DOB: 1940-07-19 MRN: [id number]   Dear Dr. [name],    We have scheduled the above patient for an endoscopic procedure. Our records show that she is on anticoagulation therapy.   Please advise as to how long the patient may come off her therapy of Eliquis prior to the procedure, which is scheduled for 10-18-2017.  Please  route the Eliquis clearance instructions to [facility] CMA.   Sincerely,    Amy Esterwood PA-C

## 2017-07-28 ENCOUNTER — Ambulatory Visit: Payer: Medicare HMO

## 2017-07-28 ENCOUNTER — Other Ambulatory Visit (INDEPENDENT_AMBULATORY_CARE_PROVIDER_SITE_OTHER): Payer: Medicare HMO

## 2017-07-28 DIAGNOSIS — D500 Iron deficiency anemia secondary to blood loss (chronic): Secondary | ICD-10-CM

## 2017-07-28 DIAGNOSIS — I10 Essential (primary) hypertension: Secondary | ICD-10-CM | POA: Diagnosis not present

## 2017-07-28 DIAGNOSIS — R7309 Other abnormal glucose: Secondary | ICD-10-CM

## 2017-07-28 DIAGNOSIS — E559 Vitamin D deficiency, unspecified: Secondary | ICD-10-CM

## 2017-07-28 DIAGNOSIS — M810 Age-related osteoporosis without current pathological fracture: Secondary | ICD-10-CM | POA: Diagnosis not present

## 2017-07-28 DIAGNOSIS — E7800 Pure hypercholesterolemia, unspecified: Secondary | ICD-10-CM | POA: Diagnosis not present

## 2017-07-28 LAB — COMPREHENSIVE METABOLIC PANEL
ALT: 24 U/L (ref 0–35)
AST: 21 U/L (ref 0–37)
Albumin: 4.1 g/dL (ref 3.5–5.2)
Alkaline Phosphatase: 86 U/L (ref 39–117)
BUN: 28 mg/dL — ABNORMAL HIGH (ref 6–23)
CO2: 25 mEq/L (ref 19–32)
Calcium: 9.7 mg/dL (ref 8.4–10.5)
Chloride: 107 mEq/L (ref 96–112)
Creatinine, Ser: 0.98 mg/dL (ref 0.40–1.20)
GFR: 58.18 mL/min — ABNORMAL LOW (ref >60.00)
Glucose, Bld: 97 mg/dL (ref 70–99)
Potassium: 4.1 mEq/L (ref 3.5–5.1)
Sodium: 140 mEq/L (ref 135–145)
Total Bilirubin: 0.5 mg/dL (ref 0.2–1.2)
Total Protein: 6.8 g/dL (ref 6.0–8.3)

## 2017-07-28 LAB — CBC WITH DIFFERENTIAL/PLATELET
Basophils Absolute: 0 10*3/uL (ref 0.0–0.1)
Basophils Relative: 0.2 % (ref 0.0–3.0)
Eosinophils Absolute: 0.1 10*3/uL (ref 0.0–0.7)
Eosinophils Relative: 0.7 % (ref 0.0–5.0)
HCT: 38.4 % (ref 36.0–46.0)
Hemoglobin: 12.7 g/dL (ref 12.0–15.0)
Lymphocytes Relative: 26.2 % (ref 12.0–46.0)
Lymphs Abs: 2.1 10*3/uL (ref 0.7–4.0)
MCHC: 33.2 g/dL (ref 30.0–36.0)
MCV: 100.9 fl — ABNORMAL HIGH (ref 78.0–100.0)
Monocytes Absolute: 0.8 10*3/uL (ref 0.1–1.0)
Monocytes Relative: 9.4 % (ref 3.0–12.0)
Neutro Abs: 5.2 10*3/uL (ref 1.4–7.7)
Neutrophils Relative %: 63.5 % (ref 43.0–77.0)
Platelets: 237 10*3/uL (ref 150.0–400.0)
RBC: 3.8 Mil/uL — ABNORMAL LOW (ref 3.87–5.11)
RDW: 14.1 % (ref 11.5–15.5)
WBC: 8.1 10*3/uL (ref 4.0–10.5)

## 2017-07-28 LAB — LIPID PANEL
Cholesterol: 150 mg/dL (ref 0–200)
HDL: 54.4 mg/dL (ref >39.00)
LDL Cholesterol: 71 mg/dL (ref 0–99)
NonHDL: 95.96
Total CHOL/HDL Ratio: 3
Triglycerides: 125 mg/dL (ref 0.0–149.0)
VLDL: 25 mg/dL (ref 0.0–40.0)

## 2017-07-28 LAB — TSH: TSH: 3.02 u[IU]/mL (ref 0.35–4.50)

## 2017-07-28 LAB — VITAMIN D 25 HYDROXY (VIT D DEFICIENCY, FRACTURES): VITD: 25.9 ng/mL — ABNORMAL LOW (ref 30.00–100.00)

## 2017-07-28 LAB — HEMOGLOBIN A1C: Hgb A1c MFr Bld: 5.4 % (ref 4.6–6.5)

## 2017-07-28 NOTE — Progress Notes (Signed)
I agree with the above note, plan 

## 2017-07-29 ENCOUNTER — Ambulatory Visit: Payer: Medicare HMO

## 2017-08-01 ENCOUNTER — Encounter: Payer: Self-pay | Admitting: Family Medicine

## 2017-08-01 ENCOUNTER — Ambulatory Visit (INDEPENDENT_AMBULATORY_CARE_PROVIDER_SITE_OTHER): Payer: Medicare HMO | Admitting: Family Medicine

## 2017-08-01 VITALS — BP 122/58 | HR 61 | Temp 98.4°F | Ht 64.0 in | Wt 147.5 lb

## 2017-08-01 DIAGNOSIS — I824Y2 Acute embolism and thrombosis of unspecified deep veins of left proximal lower extremity: Secondary | ICD-10-CM

## 2017-08-01 DIAGNOSIS — M810 Age-related osteoporosis without current pathological fracture: Secondary | ICD-10-CM | POA: Diagnosis not present

## 2017-08-01 DIAGNOSIS — Z0000 Encounter for general adult medical examination without abnormal findings: Secondary | ICD-10-CM

## 2017-08-01 DIAGNOSIS — I10 Essential (primary) hypertension: Secondary | ICD-10-CM

## 2017-08-01 DIAGNOSIS — E559 Vitamin D deficiency, unspecified: Secondary | ICD-10-CM | POA: Diagnosis not present

## 2017-08-01 DIAGNOSIS — E876 Hypokalemia: Secondary | ICD-10-CM

## 2017-08-01 DIAGNOSIS — E7800 Pure hypercholesterolemia, unspecified: Secondary | ICD-10-CM

## 2017-08-01 DIAGNOSIS — C188 Malignant neoplasm of overlapping sites of colon: Secondary | ICD-10-CM

## 2017-08-01 DIAGNOSIS — R7309 Other abnormal glucose: Secondary | ICD-10-CM

## 2017-08-01 DIAGNOSIS — R928 Other abnormal and inconclusive findings on diagnostic imaging of breast: Secondary | ICD-10-CM | POA: Diagnosis not present

## 2017-08-01 MED ORDER — POTASSIUM CHLORIDE CRYS ER 20 MEQ PO TBCR: 20.0000 meq | EXTENDED_RELEASE_TABLET | Freq: Every day | ORAL | 3 refills | Status: DC

## 2017-08-01 MED ORDER — ATORVASTATIN CALCIUM 40 MG PO TABS: 40.0000 mg | ORAL_TABLET | Freq: Every day | ORAL | 3 refills | Status: DC

## 2017-08-01 NOTE — Assessment & Plan Note (Signed)
bp in fair control at this time  BP Readings from Last 1 Encounters:  08/01/17 (!) 122/58   No changes needed Most recent labs reviewed  Disc lifstyle change with low sodium diet and exercise

## 2017-08-01 NOTE — Progress Notes (Signed)
Subjective:    Patient ID: [name], female    DOB: April 19, 1940, 77 y.o.   MRN: [id number]  HPI Here for health maintenance exam and to review chronic medical problems    Still very active - caring for and riding horses   Wt Readings from Last 3 Encounters:  08/01/17 147 lb 8 oz (66.9 kg)  07/27/17 149 lb 6.4 oz (67.8 kg)  06/20/17 152 lb 8 oz (69.2 kg)  weight is leveling off after cancer treatment  25.32 kg/m   Needs to schedule amw (had to cancel with Katha Cabal)  Mammogram 7/18 - 6 mo recall recommended -diag mammogram and [place] (L breast density)  Self exam -no lumps  Has her diag mm and [place] scheduled on 6/28  Colonoscopy 7/18  Hx of colon cancer/treated -planning her next colonoscopy currently (6/27) -will have to hold her eliquis  Is getting a lot stronger after treatment and able to do her job  MRI planned at the end of the month    Flu shot 11/18  Tetanus shot 2/16 PNA shots complete  dexa 6/18 Osteopenia- T score -2.4 at the hip occ falls=not wearing appropriate shoes  D level is 25.9- does not regularly take D    Zoster status - zostavax 4/12  bp is stable today  No cp or palpitations or headaches or edema  No side effects to medicines  BP Readings from Last 3 Encounters:  08/01/17 (!) 122/58  07/27/17 90/64  06/20/17 (!) 142/66       Lab Results  Component Value Date   CREATININE 0.98 07/28/2017   BUN 28 (H) 07/28/2017   NA 140 07/28/2017   K 4.1 07/28/2017   CL 107 07/28/2017   CO2 25 07/28/2017   Lab Results  Component Value Date   ALT 24 07/28/2017   AST 21 07/28/2017   ALKPHOS 86 07/28/2017   BILITOT 0.5 07/28/2017   Lab Results  Component Value Date   TSH 3.02 07/28/2017     Hx of CVA and CAD in the past Also DVT after diag with cancer  On eliquis   Blood glucose/hyperglycemia Lab Results  Component Value Date   HGBA1C 5.4 07/28/2017  down from 6.3    Hyperlipidemia Lab Results  Component Value Date   CHOL  150 07/28/2017   CHOL 142 07/28/2016   CHOL 141 10/23/2015   Lab Results  Component Value Date   HDL 54.40 07/28/2017   HDL 58.50 07/28/2016   HDL 58.40 10/23/2015   Lab Results  Component Value Date   LDLCALC 71 07/28/2017   LDLCALC 69 07/28/2016   LDLCALC 72 10/23/2015   Lab Results  Component Value Date   TRIG 125.0 07/28/2017   TRIG 122 11/22/2016   TRIG 88 11/16/2016   Lab Results  Component Value Date   CHOLHDL 3 07/28/2017   CHOLHDL 2 07/28/2016   CHOLHDL 2 10/23/2015   Lab Results  Component Value Date   LDLDIRECT 138.9 07/05/2012   LDLDIRECT 135.4 10/12/2011   LDLDIRECT 132.1 02/02/2010  good control with atorvastatin and diet   Anemia is improved (after cancer tx and surgery)  Lab Results  Component Value Date   WBC 8.1 07/28/2017   HGB 12.7 07/28/2017   HCT 38.4 07/28/2017   MCV 100.9 (H) 07/28/2017   PLT 237.0 07/28/2017   Her R great toe nail is "hanging off"  Taped it up  Old trauma - horse stepped on  Lots of foot problems and  occ loses nail on that toe  Has not seen the foot doctor in a while (has hammer toes- not ready for surgery yet)  Calluses are in good shape    Patient Active Problem List   Diagnosis Date Noted  . Genetic testing 04/25/2017  . Mouth ulcer 04/05/2017  . Adenopathy   . Acute deep vein thrombosis (DVT) of proximal vein of left lower extremity ([place]) 11/24/2016  . Cancer of overlapping sites of colon ([place]) 09/23/2016  . Iron deficiency anemia due to chronic blood loss 09/23/2016  . Estrogen deficiency 07/30/2016  . Screening mammogram, encounter for 07/30/2016  . Routine general medical examination at a health care facility 07/27/2016  . Right buttock pain 06/27/2015  . Accidental fall 06/27/2015  . Left hip pain 06/27/2015  . Stroke (cerebrum) ([place]) 05/06/2015  . HTN (hypertension) 05/06/2015  . HLD (hyperlipidemia) 05/06/2015  . GERD (gastroesophageal reflux disease) 05/06/2015  . CAD (coronary artery disease)  05/06/2015  . Insect bites 09/17/2014  . Joint pain 05/01/2013  . Pain in joint, ankle and foot 03/02/2013  . Seborrheic keratosis 07/05/2012  . Ankle joint pain 07/16/2011  . 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 Callus of foot 10/16/2010  . Coronary artery disease, non-occlusive 10/16/2010  . INSOMNIA 02/02/2010  . HAND PAIN, BILATERAL 01/09/2010  . CRAMP OF LIMB 01/09/2010  . HYPERGLYCEMIA 07/29/2009  . ALLERGIC RHINITIS 12/04/2008  . Vitamin D deficiency 04/30/2008  . COLONIC POLYPS 06/28/2007  . IBS 03/17/2007  . INTERSTITIAL CYSTITIS 03/17/2007  . Osteoporosis 03/17/2007   Past Medical History:  Diagnosis Date  . Anemia   . Arthritis   . Astigmatism    both eyes  . Cancer [facility])    colon surgery done last chemo tx 01-17-17  . Colon cancer ([place])   . Coronary artery disease, non-occlusive   . Dyspnea    more so exertion  . Dysrhythmia    skipped beats every now and then  . GERD (gastroesophageal reflux disease)   . Glaucoma    both eyes right eye worse than left  . Hammer toes of both feet   . Headache    migraines with aura  . Heel spur    both heels and front  . HSV (herpes simplex virus) infection   . Hyperglycemia   . Hyperlipidemia   . IBS (irritable bowel syndrome)   . IC (interstitial cystitis)   . Insomnia disorder related to known organic factor   . Osteoporosis   . PONV (postoperative nausea and vomiting)    has had more surgeries, and had no problems  . Stroke ([place]) 2016  . Vitamin D deficiency    Past Surgical History:  Procedure Laterality Date  . ABDOMINAL HYSTERECTOMY     1 ovary and womb removed  . CARDIAC CATHETERIZATION  7/12   non obst dz -- not sure what yr  . CHOLECYSTECTOMY    . EUS N/A 02/03/2017   Procedure: UPPER ENDOSCOPIC ULTRASOUND (EUS) LINEAR;  Surgeon: [name], MD;  Location: [facility];  Service: Endoscopy;  Laterality: N/A;  . EYE SURGERY Bilateral    ioc for cataracts   . LAPAROSCOPIC RIGHT COLECTOMY Right  11/10/2016   Procedure: LAPAROSCOPIC EXTENDED RIGHT COLECTOMY;  Surgeon: [name], MD;  Location: [place];  Service: General;  Laterality: Right;  . PORTACATH PLACEMENT Left 12/22/2016   Procedure: INSERTION PORT-A-CATH;  Surgeon: [name], MD;  Location: [place];  Service: General;  Laterality: Left;  . ROTATOR CUFF  REPAIR Bilateral    Social History   Tobacco Use  . Smoking status: Never Smoker  . Smokeless tobacco: Never Used  Substance Use Topics  . Alcohol use: No    Alcohol/week: 0.0 oz  . Drug use: No   Family History  Problem Relation Age of Onset  . Alcohol abuse Mother   . Heart attack Mother   . GER disease Mother   . GER disease Father   . Alcohol abuse Father   . Stroke Father   . Tuberculosis Father   . Heart attack Father   . Aneurysm Father   . Mental illness Sister   . Depression Sister   . Breast cancer Neg Hx    Allergies  Allergen Reactions  . Salmon [Fish Allergy] Anaphylaxis    This happens with "CANNED SALMON" only, if eaten uncooked  . Ampicillin Nausea Only    Has patient had a PCN reaction causing immediate rash, facial/tongue/throat swelling, SOB or lightheadedness with hypotension: No Has patient had a PCN reaction causing severe rash involving mucus membranes or skin necrosis: No Has patient had a PCN reaction that required hospitalization: No Has patient had a PCN reaction occurring within the last 10 years: No If all of the above answers are "NO", then may proceed with Cephalosporin use.   . Codeine Nausea And Vomiting  . Flagyl [Metronidazole] Nausea And Vomiting    Patient was taking this along with Neomycin and could not differentiate which med triggered the nausea & vomiting  . Meperidine Hcl Nausea And Vomiting  . Neomycin Nausea And Vomiting    Patient was taking this along with Flagyl and could not differentiate which med triggered the nausea & vomiting  . Penicillins Other (See Comments)    Has patient  had a PCN reaction causing immediate rash, facial/tongue/throat swelling, SOB or lightheadedness with hypotension: Unknown Has patient had a PCN reaction causing severe rash involving mucus membranes or skin necrosis: No Has patient had a PCN reaction that required hospitalization: No Has patient had a PCN reaction occurring within the last 10 years: No If all of the above answers are "NO", then may proceed with Cephalosporin use.   Marland Kitchen Propoxyphene Hcl Nausea And Vomiting  . Sulfa Antibiotics Hives  . Tramadol Hcl Nausea And Vomiting   Current Outpatient Medications on File Prior to Visit  Medication Sig Dispense Refill  . atorvastatin (LIPITOR) 40 MG tablet Take 1 tablet (40 mg total) by mouth daily at 6 PM. 90 tablet 3  . b complex vitamins tablet Take 1 tablet by mouth daily.    . Cholecalciferol (VITAMIN D) 2000 units tablet Take 2,000 Units by mouth 3 (three) times a week.     Marland Kitchen ELIQUIS 5 MG TABS tablet TAKE 1 TABLET BY MOUTH TWICE A DAY 60 tablet 2  . gabapentin (NEURONTIN) 100 MG capsule TAKE 1 CAPSULE (100 MG TOTAL) BY MOUTH 2 (TWO) TIMES DAILY AS NEEDED. 30 capsule 2  . Ibuprofen-Diphenhydramine Cit (IBUPROFEN PM) 200-38 MG TABS Take 1 tablet by mouth daily as needed.     . lactose free nutrition (BOOST) LIQD Take 237 mLs by mouth daily as needed.     . lidocaine-prilocaine (EMLA) cream Apply to affected area once (Patient taking differently: Apply to affected area once (near port)) 30 g 3  . Multiple Vitamin (MULTIVITAMIN WITH MINERALS) TABS tablet Take 1 tablet by mouth daily.    . polyethylene glycol (MIRALAX / GLYCOLAX) packet Take 17 g by mouth daily. (Patient  taking differently: Take 17 g by mouth daily as needed. ) 14 each 0  . polyethylene glycol-electrolytes (NULYTELY/GOLYTELY) 420 g solution Take as directed for colonoscopy. (Patient not taking: Reported on 08/01/2017) 4000 mL 0  . potassium chloride SA (K-DUR,KLOR-CON) 20 MEQ tablet Take 1 tablet (20 mEq total) by mouth  daily. (Patient not taking: Reported on 08/01/2017) 30 tablet 1   No current facility-administered medications on file prior to visit.     Review of Systems  Constitutional: Negative for activity change, appetite change, fatigue, fever and unexpected weight change.       Well appearing   HENT: Negative for congestion, ear pain, rhinorrhea, sinus pressure and sore throat.   Eyes: Negative for pain, redness and visual disturbance.  Respiratory: Negative for cough, shortness of breath and wheezing.   Cardiovascular: Negative for chest pain and palpitations.  Gastrointestinal: Negative for abdominal pain, blood in stool, constipation and diarrhea.  Endocrine: Negative for polydipsia and polyuria.  Genitourinary: Negative for dysuria, frequency and urgency.  Musculoskeletal: Negative for arthralgias, back pain and myalgias.  Skin: Negative for pallor and rash.       R great toe nail is coming off  Allergic/Immunologic: Negative for environmental allergies.  Neurological: Negative for dizziness, syncope and headaches.  Hematological: Negative for adenopathy. Does not bruise/bleed easily.  Psychiatric/Behavioral: Negative for decreased concentration and dysphoric mood. The patient is not nervous/anxious.        Objective:   Physical Exam  Constitutional: She appears well-developed and well-nourished. No distress.  Well appearing   HENT:  Head: Normocephalic and atraumatic.  Right Ear: External ear normal.  Left Ear: External ear normal.  Mouth/Throat: Oropharynx is clear and moist.  Eyes: Pupils are equal, round, and reactive to light. Conjunctivae and EOM are normal. No scleral icterus.  Neck: Normal range of motion. Neck supple. No JVD present. Carotid bruit is not present. No thyromegaly present.  Cardiovascular: Normal rate, regular rhythm, normal heart sounds and intact distal pulses. Exam reveals no gallop.  Pulmonary/Chest: Effort normal and breath sounds normal. No respiratory  distress. She has no wheezes. She exhibits no tenderness. No breast tenderness, discharge or bleeding.  Abdominal: Soft. Bowel sounds are normal. She exhibits no distension, no abdominal bruit and no mass. There is no tenderness.  Genitourinary: No breast tenderness, discharge or bleeding.  Genitourinary Comments: Breast exam: No mass, nodules, thickening, tenderness, bulging, retraction, inflamation, nipple discharge or skin changes noted.  No axillary or clavicular LA.      Musculoskeletal: Normal range of motion. She exhibits no edema or tenderness.  Severe foot deformities/hammer toes  Calluses are trimmed well    Lymphadenopathy:    She has no cervical adenopathy.  Neurological: She is alert. She has normal reflexes. No cranial nerve deficit. She exhibits normal muscle tone. Coordination normal.  Skin: Skin is warm and dry. No rash noted. No erythema. No pallor.  R great toe nail is loose/ coming up  No signs of infection     Psychiatric: She has a normal mood and affect.          Assessment & Plan:   Problem List Items Addressed This Visit      Cardiovascular and Mediastinum   Acute deep vein thrombosis (DVT) of proximal vein of left lower extremity (HCC)    Managed by heme Adella Nissen Doing well with eliquis       Relevant Medications   atorvastatin (LIPITOR) 40 MG tablet   HTN (hypertension)    bp  in fair control at this time  BP Readings from Last 1 Encounters:  08/01/17 (!) 122/58   No changes needed Most recent labs reviewed  Disc lifstyle change with low sodium diet and exercise        Relevant Medications   atorvastatin (LIPITOR) 40 MG tablet     Digestive   Cancer of overlapping sites of colon [facility]) (Chronic)    Doing well after treatment with close f/u  For colonoscopy upcoming         Musculoskeletal and Integument   Osteoporosis    dexa 6/18  Falls -no fractures / fall prev disc in detail  Low D- enc to start back on 4000-5000 iu daily in addn to  other supplements  Disc need for calcium/ vitamin D/ wt bearing exercise and bone density test every 2 y to monitor Disc safety/ fracture risk in detail          Other   Abnormality of breast on screening mammography    Overdue for 6 mo f/u diag mm and [place] Reassuring  Exam  Imaging is scheduled 6/28       HLD (hyperlipidemia)    Disc goals for lipids and reasons to control them Rev last labs with pt Rev low sat fat diet in detail Continue atorvastatin        Relevant Medications   atorvastatin (LIPITOR) 40 MG tablet   HYPERGLYCEMIA    Lab Results  Component Value Date   HGBA1C 5.4 07/28/2017   Improved disc imp of low glycemic diet and wt loss to prevent DM2       Routine general medical examination at a health care facility - Primary    Reviewed health habits including diet and exercise and skin cancer prevention Reviewed appropriate screening tests for age  Also reviewed health mt list, fam hx and immunization status , as well as social and family history   See HPI  amw -to be re scheduled  Labs reviewed  Colonoscopy planned diag mm/ [place] on 6/28- scheduled  Disc better foot wear and fall prevention  Enc better adherence with vit D intake       Vitamin D deficiency    Level still low at 25 Pt was afraid to take extra D with other supplements Enc to get 4000-5000 iu daily  Disc imp to bone and overall health       Other Visit Diagnoses    Hypokalemia       Relevant Medications   potassium chloride SA (K-DUR,KLOR-CON) 20 MEQ tablet

## 2017-08-01 NOTE — Assessment & Plan Note (Signed)
Disc goals for lipids and reasons to control them Rev last labs with pt Rev low sat fat diet in detail Continue atorvastatin   

## 2017-08-01 NOTE — Assessment & Plan Note (Signed)
Reviewed health habits including diet and exercise and skin cancer prevention Reviewed appropriate screening tests for age  Also reviewed health mt list, fam hx and immunization status , as well as social and family history   See HPI  amw -to be re scheduled  Labs reviewed  Colonoscopy planned diag mm/ [place] on 6/28- scheduled  Disc better foot wear and fall prevention  Enc better adherence with vit D intake

## 2017-08-01 NOTE — Assessment & Plan Note (Signed)
Managed by heme Adella Nissen Doing well with eliquis

## 2017-08-01 NOTE — Assessment & Plan Note (Signed)
Lab Results  Component Value Date   HGBA1C 5.4 07/28/2017   Improved disc imp of low glycemic diet and wt loss to prevent DM2

## 2017-08-01 NOTE — Assessment & Plan Note (Signed)
Overdue for 6 mo f/u diag mm and [place] Reassuring  Exam  Imaging is scheduled 6/28

## 2017-08-01 NOTE — Assessment & Plan Note (Signed)
Doing well after treatment with close f/u  For colonoscopy upcoming

## 2017-08-01 NOTE — Assessment & Plan Note (Signed)
dexa 6/18  Falls -no fractures / fall prev disc in detail  Low D- enc to start back on 4000-5000 iu daily in addn to other supplements  Disc need for calcium/ vitamin D/ wt bearing exercise and bone density test every 2 y to monitor Disc safety/ fracture risk in detail

## 2017-08-01 NOTE — Patient Instructions (Addendum)
Start wearing better /more protective shoes   Your vitamin D is still low  Please take 4000 iu of vitamin D over the counter in addition whatever you take   We will refer you for your mammogram and re schedule your medicare interview with nurse Katha Cabal

## 2017-08-01 NOTE — Assessment & Plan Note (Signed)
Level still low at 25 Pt was afraid to take extra D with other supplements Enc to get 4000-5000 iu daily  Disc imp to bone and overall health

## 2017-08-01 NOTE — Telephone Encounter (Signed)
The patient was notified to hold the Eliquis on 8-25 throguh 8-27. She is to resume it on 8-28. The patient verbalized understanding.

## 2017-08-01 NOTE — Telephone Encounter (Signed)
Called the patient to advise she can h old the Eliquis on 8-25 through 8-27 and she can resume it on 8-28.  The patient verbalized understanding the Eliquis clearance directions.

## 2017-08-17 ENCOUNTER — Other Ambulatory Visit: Payer: Self-pay | Admitting: Hematology

## 2017-08-18 DIAGNOSIS — H6523 Chronic serous otitis media, bilateral: Secondary | ICD-10-CM | POA: Diagnosis not present

## 2017-08-18 DIAGNOSIS — H6121 Impacted cerumen, right ear: Secondary | ICD-10-CM | POA: Diagnosis not present

## 2017-08-19 ENCOUNTER — Ambulatory Visit
Admission: RE | Admit: 2017-08-19 | Discharge: 2017-08-19 | Disposition: A | Discharge status: Home / Self Care | Payer: Medicare HMO | Source: Ambulatory Visit | Attending: Family Medicine | Admitting: Family Medicine

## 2017-08-19 DIAGNOSIS — M7989 Other specified soft tissue disorders: Secondary | ICD-10-CM

## 2017-08-19 DIAGNOSIS — N641 Fat necrosis of breast: Secondary | ICD-10-CM | POA: Diagnosis not present

## 2017-08-19 DIAGNOSIS — R928 Other abnormal and inconclusive findings on diagnostic imaging of breast: Secondary | ICD-10-CM | POA: Diagnosis not present

## 2017-09-19 ENCOUNTER — Inpatient Hospital Stay: Payer: Medicare HMO | Attending: Nurse Practitioner

## 2017-09-19 ENCOUNTER — Encounter (HOSPITAL_COMMUNITY): Payer: Self-pay | Admitting: Radiology

## 2017-09-19 ENCOUNTER — Ambulatory Visit (HOSPITAL_COMMUNITY)
Admission: RE | Admit: 2017-09-19 | Discharge: 2017-09-19 | Disposition: A | Discharge status: Home / Self Care | Payer: Medicare HMO | Source: Ambulatory Visit | Attending: Hematology | Admitting: Hematology

## 2017-09-19 DIAGNOSIS — Z86718 Personal history of other venous thrombosis and embolism: Secondary | ICD-10-CM | POA: Diagnosis not present

## 2017-09-19 DIAGNOSIS — D509 Iron deficiency anemia, unspecified: Secondary | ICD-10-CM | POA: Diagnosis not present

## 2017-09-19 DIAGNOSIS — C189 Malignant neoplasm of colon, unspecified: Secondary | ICD-10-CM

## 2017-09-19 DIAGNOSIS — I10 Essential (primary) hypertension: Secondary | ICD-10-CM | POA: Diagnosis not present

## 2017-09-19 DIAGNOSIS — D500 Iron deficiency anemia secondary to blood loss (chronic): Secondary | ICD-10-CM

## 2017-09-19 DIAGNOSIS — C188 Malignant neoplasm of overlapping sites of colon: Secondary | ICD-10-CM | POA: Diagnosis not present

## 2017-09-19 DIAGNOSIS — K7689 Other specified diseases of liver: Secondary | ICD-10-CM | POA: Insufficient documentation

## 2017-09-19 DIAGNOSIS — Z7901 Long term (current) use of anticoagulants: Secondary | ICD-10-CM | POA: Diagnosis not present

## 2017-09-19 DIAGNOSIS — C787 Secondary malignant neoplasm of liver and intrahepatic bile duct: Secondary | ICD-10-CM | POA: Diagnosis not present

## 2017-09-19 DIAGNOSIS — I4891 Unspecified atrial fibrillation: Secondary | ICD-10-CM | POA: Insufficient documentation

## 2017-09-19 DIAGNOSIS — D6481 Anemia due to antineoplastic chemotherapy: Secondary | ICD-10-CM | POA: Insufficient documentation

## 2017-09-19 DIAGNOSIS — I700 Atherosclerosis of aorta: Secondary | ICD-10-CM | POA: Insufficient documentation

## 2017-09-19 LAB — RETICULOCYTES
RBC.: 4.03 MIL/uL (ref 3.70–5.45)
Retic Count, Absolute: 64.5 10*3/uL (ref 33.7–90.7)
Retic Ct Pct: 1.6 % (ref 0.7–2.1)

## 2017-09-19 LAB — COMPREHENSIVE METABOLIC PANEL
ALT: 32 U/L (ref 0–44)
AST: 27 U/L (ref 15–41)
Albumin: 4.1 g/dL (ref 3.5–5.0)
Alkaline Phosphatase: 102 U/L (ref 38–126)
Anion gap: 6 (ref 5–15)
BUN: 26 mg/dL — ABNORMAL HIGH (ref 8–23)
CO2: 27 mmol/L (ref 22–32)
Calcium: 9.3 mg/dL (ref 8.9–10.3)
Chloride: 102 mmol/L (ref 98–111)
Creatinine, Ser: 0.87 mg/dL (ref 0.44–1.00)
GFR calc Af Amer: >60 mL/min (ref >60)
GFR calc non Af Amer: >60 mL/min (ref >60)
Glucose, Bld: 90 mg/dL (ref 70–99)
Potassium: 4.4 mmol/L (ref 3.5–5.1)
Sodium: 135 mmol/L (ref 135–145)
Total Bilirubin: 0.7 mg/dL (ref 0.3–1.2)
Total Protein: 7 g/dL (ref 6.5–8.1)

## 2017-09-19 LAB — CBC WITH DIFFERENTIAL/PLATELET
Basophils Absolute: 0 10*3/uL (ref 0.0–0.1)
Basophils Relative: 0 %
Eosinophils Absolute: 0.1 10*3/uL (ref 0.0–0.5)
Eosinophils Relative: 1 %
HCT: 38.4 % (ref 34.8–46.6)
Hemoglobin: 12.6 g/dL (ref 11.6–15.9)
Lymphocytes Relative: 32 %
Lymphs Abs: 2.4 10*3/uL (ref 0.9–3.3)
MCH: 31.3 pg (ref 25.1–34.0)
MCHC: 32.8 g/dL (ref 31.5–36.0)
MCV: 95.3 fL (ref 79.5–101.0)
Monocytes Absolute: 0.7 10*3/uL (ref 0.1–0.9)
Monocytes Relative: 9 %
Neutro Abs: 4.3 10*3/uL (ref 1.5–6.5)
Neutrophils Relative %: 58 %
Platelets: 223 10*3/uL (ref 145–400)
RBC: 4.03 MIL/uL (ref 3.70–5.45)
RDW: 12.9 % (ref 11.2–14.5)
WBC: 7.5 10*3/uL (ref 3.9–10.3)

## 2017-09-19 LAB — IRON AND TIBC
Iron: 66 ug/dL (ref 41–142)
Saturation Ratios: 20 % — ABNORMAL LOW (ref 21–57)
TIBC: 328 ug/dL (ref 236–444)
UIBC: 262 ug/dL

## 2017-09-19 LAB — FERRITIN: Ferritin: 51 ng/mL (ref 11–307)

## 2017-09-19 MED ORDER — HEPARIN SOD (PORK) LOCK FLUSH 100 UNIT/ML IV SOLN: 500.0000 [IU] | Freq: Once | INTRAVENOUS | Status: DC

## 2017-09-19 MED ORDER — IOPAMIDOL (ISOVUE-300) INJECTION 61%
INTRAVENOUS | Status: AC
  Filled 2017-09-19: qty 100

## 2017-09-19 MED ORDER — HEPARIN SOD (PORK) LOCK FLUSH 100 UNIT/ML IV SOLN
INTRAVENOUS | Status: AC
  Filled 2017-09-19: qty 5

## 2017-09-19 MED ORDER — IOPAMIDOL (ISOVUE-300) INJECTION 61%
100.0000 mL | Freq: Once | INTRAVENOUS | Status: AC | PRN
  Administered 2017-09-19: 100 mL via INTRAVENOUS

## 2017-09-19 MED ORDER — IOPAMIDOL (ISOVUE-300) INJECTION 61%
INTRAVENOUS | Status: AC
  Filled 2017-09-19: qty 30

## 2017-09-19 MED ORDER — IOPAMIDOL (ISOVUE-300) INJECTION 61%
30.0000 mL | Freq: Once | INTRAVENOUS | Status: AC | PRN
  Administered 2017-09-19: 30 mL via ORAL

## 2017-09-21 ENCOUNTER — Other Ambulatory Visit: Payer: Self-pay | Admitting: Hematology

## 2017-09-21 ENCOUNTER — Encounter: Payer: Self-pay | Admitting: Hematology

## 2017-09-21 ENCOUNTER — Inpatient Hospital Stay (HOSPITAL_BASED_OUTPATIENT_CLINIC_OR_DEPARTMENT_OTHER): Payer: Medicare HMO | Admitting: Hematology

## 2017-09-21 ENCOUNTER — Telehealth: Payer: Self-pay

## 2017-09-21 VITALS — BP 136/55 | HR 71 | Temp 98.4°F | Resp 18 | Ht 64.0 in | Wt 152.6 lb

## 2017-09-21 DIAGNOSIS — D509 Iron deficiency anemia, unspecified: Secondary | ICD-10-CM

## 2017-09-21 DIAGNOSIS — Z7901 Long term (current) use of anticoagulants: Secondary | ICD-10-CM | POA: Diagnosis not present

## 2017-09-21 DIAGNOSIS — C188 Malignant neoplasm of overlapping sites of colon: Secondary | ICD-10-CM | POA: Diagnosis not present

## 2017-09-21 DIAGNOSIS — C787 Secondary malignant neoplasm of liver and intrahepatic bile duct: Secondary | ICD-10-CM

## 2017-09-21 DIAGNOSIS — I4891 Unspecified atrial fibrillation: Secondary | ICD-10-CM

## 2017-09-21 DIAGNOSIS — Z86718 Personal history of other venous thrombosis and embolism: Secondary | ICD-10-CM

## 2017-09-21 DIAGNOSIS — I10 Essential (primary) hypertension: Secondary | ICD-10-CM | POA: Diagnosis not present

## 2017-09-21 DIAGNOSIS — D6481 Anemia due to antineoplastic chemotherapy: Secondary | ICD-10-CM | POA: Diagnosis not present

## 2017-09-21 NOTE — Telephone Encounter (Signed)
Printed avs and calender of upcoming appointment. Per 7/31 los also gave number for the  MRI

## 2017-09-21 NOTE — Progress Notes (Signed)
[place]  [phone] [phone]  Clinic Follow-up Note   Patient Care Team: Tower, [name], MD as PCP - General [name], MD as Consulting Physician (Hematology) [name], MD as Attending Physician (Gastroenterology) [name], MD as Consulting Physician (General Surgery)   Date of Service:  09/21/2017   CHIEF COMPLAINTS:  Follow up for Colorectal adenocarcinoma, moderately differentiated  Oncology History   Cancer Staging Cancer of overlapping sites of colon [facility]) Staging form: Colon and Rectum, AJCC 8th Edition - Pathologic stage from 11/10/2016: Stage IIIB (pT3(m), pN1c, cM0) - Signed by [name], MD on 12/16/2016       Cancer of overlapping sites of colon [facility])   08/31/2016 Tumor Marker    Patient's tumor was tested for the following markers: CEA. Results of the tumor marker test revealed 2.0.      08/31/2016 Pathology Results    Colon biopsy Diagnosis: 1. Surgical [P], hepatic flexure. ADENOCARCINOMA. Moderately differentiated. 2. Surgical [P], transverse. ADENOCARCINOMA, Moderately differentiated.      08/31/2016 Initial Diagnosis    Colon cancer metastasized to liver ([place])      08/31/2016 Procedure    - 1) Polypoid mass at the hepatic flexure, 2cm, soft but clearly neoplastic.  - 2) Clearly malignant mass in the distal transverse colon; 4cm, firm, 1/2 cirumference, ulcerated. ' - 3) Four small (3-14m) scattered typical appearing adenomas throughout the colon, notremoved since she mistakenly stayed on her plavix for this procedure.      09/01/2016 Imaging    CT Abdomen Pelvis W Contrast IMPRESSION: There are 2 colonic lesions involving the proximal ascending colon and proximal transverse colon, as detailed above, highly concerning for colonic neoplasm. In addition, there are at least 2 indeterminate liver lesions which are suspicious for potential metastatic disease.       09/16/2016 Imaging    Mr Liver W Wo  Contrast IMPRESSION: Lesion within central right lobe of liver exhibits of peripheral enhancement and is suspicious for liver metastasis. Lesion within caudate lobe of liver identified on recent CT does not have a corresponding signal or enhancement abnormality on today's study. Within the inferior right lobe of liver there is an enhancing structure which is favored to represent an atypical benign hemangioma with liver metastasis felt less likely. The transverse colon lesion is again identified compatible with colonic adenocarcinoma.      10/04/2016 PET scan    PET 10/04/16 IMPRESSION: 1. Highly hypermetabolic proximal transverse colon mass, maximum SUV 30.8. Small focus of hypermetabolic activity in the mesentery just above this mass probably represents a local involved lymph node. 2. Moderately hypermetabolic ascending colon mass, maximum SUV 8.7. 3. Highly hypermetabolic enlarged portacaval lymph node, maximum SUV 16.7. 4. Focus of hypermetabolic activity near the duodenum bulb could be physiologic or due to an adjacent lymph node. 5. None of the liver lesions are discernibly hypermetabolic. Particularly the more cephalad right hepatic lobe lesion merits surveillance, it had nonspecific enhancement characteristics on prior cross-sectional imaging and only a thin enhancing wall which could conceivably predispose to false negative due to central necrosis. 6. Several tiny chronic pulmonary nodules are likely benign. 7. Other imaging findings of potential clinical significance: Aortic Atherosclerosis (ICD10-I70.0). Coronary atherosclerosis. Bilateral nonobstructive nephrolithiasis.      11/10/2016 Surgery    LAPAROSCOPIC EXTENDED RIGHT COLECTOMY by Dr. [name]      11/10/2016 Pathology Results    Diagnosis 11/10/16 Colon, segmental resection for tumor, Ascending and Transverse ADENOCARCINOMA OF THE ASCENDING COLON  WITH EXTRA CELLULAR MUCIN, GRADE 3, SIZE 3.4 CM THE TUMOR INVADES  MUSCULARIS PROPRIA POORLY DIFFERENTIATED ADENOCARCINOMA OF THE TRANSVERSE COLON, GRADE 4, SIZE 6.1 CM THE TUMOR INVADES THROUGH MUSCULARIS PROPRIA INTO PERICOLONIC SOFT TISSUE EXTRAMURAL SATELLITE TUMOR NODULES IS IDENTIFIED (X1) ALL MARGINS OF RESECTION ARE NEGATIVE FOR CARCINOMA METASTATIC CARCINOMA IN ONE OF SIXTY LYMPH NODES (1/60) TABULAR ADENOMA X1      12/28/2016 Imaging    MRI abdomen w/wo contrast IMPRESSION: Motion degraded images.  Two indeterminate right liver lesions measuring up to 1.6 cm, as described above. Continued attention on follow-up is suggested.  Two periportal lymph nodes measuring up to 1.9 cm, suspicious for nodal metastases.  Postsurgical changes related to right colon resection.       12/29/2016 - 04/11/2017 Chemotherapy    FOLFOX every 2 weeks starting 12/29/16 last cycle 7 on 04/11/17.       02/03/2017 Procedure    EUS Per [name] Endoscopic Finding Findings: The examined esophagus was endoscopically normal. The entire examined stomach was endoscopically normal. The examined duodenum was endoscopically normal. Endosonographic Finding (limited evaluation for tissue acquisition): 1. One enlarged lymph node (vs soft tissue mass) was visualized in the porta hepatis region. It measured 16 mm in maximal cross-sectional diameter. The node (vs soft tissue) was oval, isoechoic and had poorly defined margins. Fine needle aspiration for cytology was performed. Color Doppler imaging was utilized prior to needle puncture to confirm a lack of significant vascular structures within the needle path. Threepasses were made with the 25 gauge needle using a transgastric approach. 2. CBD was normal, non-dilated 3. Limited views of the liver, spleen, pancreas were all normal.  Impression: One periportal soft tissue mass (lymphnode?) measuring 78m was noted and sampled with FNA.      02/03/2017 Pathology Results    Diagnosis FINE NEEDLE ASPIRATION,  ENDOSCOPIC, PERI PORTAL NODE (SPECIMEN 1 OF 1 COLLECTED 02/03/17): NO MALIGNANT CELLS IDENTIFIED. SCANT LYMPHOID AND BENIGN GLANDULAR ELEMENTS.      03/09/2017 Imaging    IMPRESSION: Interval decrease in several tiny low-attenuation liver lesions the, consistent with improving hepatic metastases.  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  TomorNext Lynch+ cancer Next was ordered throught he laboratory [facility]  TumorNext Lynch with CancerNext: Paired Germline and Tumor Analyses for EnhancedDiagnosis of Lynch Syndrome plus Analyses of 29 Additional Genes Associated with Hereditary Cancer.  Germline Genes analyzed: MLH1, MSH2, MSH6, PMS2, APC, ATM, BARD1, BMPR1A, BRIP1, CDH1, CDKN2A, CHEK2, DICER1, MRE11A, MUTYH, NBN, PALB2, PTEN, RAD50, RAD51C, RAD51D, SMAD4, STK11, TP53, CDK4, NF1, BRCA1, BRCA2, POLD1, POLE, SMARCA4, HOXB13 (sequencing and deletion/duplication); EPCAM, GREM1 (deletion/duplication only).  Results: Germline- Negative, no variants detected.  Tumor/Somatic-  BRAF V600E detected, No variants in KRAS or NRAS detected.  MLH1 promoter hypermethylation present, Microsatellite High.   Somatic pathogenic mutations in MSH2 c.687delA and pathogenic mutation in MSH6 c.3261dupC.  2 Somatic Variants of uncertain significance in MSH2 called p.A123T and p.D654N.   The date of this test report is 04/07/2017.        04/26/2017 - 06/12/2017 Chemotherapy    Xeloda 20018min the AM and 150042mn the PM started on 04/26/17. Changed to 1500m47mD on 05/16/17      06/13/2017 Imaging  CT CAP W Contrast 06/13/17 IMPRESSION: 1. Continued regression of hepatic metastatic disease. No new or progressive findings. 2. Surgical changes from a right hemicolectomy. No findings  for recurrent cancer. No mesenteric or retroperitoneal mass or Adenopathy.      09/19/2017 Imaging    CT AP W Contrast 09/19/17  IMPRESSION: 1. The inferior right lobe of liver lesion appears slightly increased in size when compared with the previous exam. 2. No new liver lesions identified. 3. Stable appearance of the colon status post right hemicolectomy. 4.  Aortic Atherosclerosis (ICD10-I70.0).        HISTORY OF PRESENTING ILLNESS: 09/23/16 [name] 77 y.o. female is here because of Colorectal adenocarcinoma, moderately differentiated  Referred by Gastroenterologist, [name] for abnormal colon biopsy. Accompanied by family friend.   Pt initially presented with one episode of rectal bleeding one year ago, but did not pursue further workup until lately, she was referred to Gastroenterologist, [name] on 07/13/16 for Routine colonoscopy. She was referred to him by [name] for repeat colonoscopy for colon CA screening. Pt last colonoscopy was with [name] in August 2011 that was completed for hx of polyps and was significant for hemorrhoids and otherwise negative.   On 08/31/16, pt underwent screening colonoscopy, which unfortunately showed 2 masses in the proximal and distant transverse colon, biopsy both showed moderately differentiated adenocarcinoma.   Pt had a CT Abdomen Pelvis that was ordered by [name] and completed on 09/01/16 and significant for colonic neoplasm and 2 indeterminate liver lesions suspicious for potential metastatic dx.   Pt had a CT Chest W Constrast that was ordered by [name] and completed on 09/06/16 and significant for no metastasis.    Pt had MR Liver W WO Contrast that was ordered by [name] and completed on 09/16/16 and significant for colonic adenocarcinoma and indeterminate liver lesions.  Pt notes that she was referred to the office by [name]. Pt has an appointment with  Surgeon, [name] tomorrow, 8/3 to discuss her options. She notes that [name] recommended a liver biopsy. Pt states that she had 1 year ago she had one large episode of rectal bleeding following consuming a milkshake, 1 episode of emesis, and a large episode of rectal bleeding that resolved on its own. Denies recurrent bleeding following initial rectal bleeding. Denies being evaluated last year for her rectal bleeding due to having issues with finding a GI specialist. Pt last colonoscopy was 7 years ago. Pt reports that due to her chronic constipation, she uses suppositories and OTC laxatives. She states that she eats mainly meat and starches due to her dislike for vegetables. She notes that she recently lost approximately 10 lbs over the past year. 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 She states that she lives with her husband and has 3 older children, 2 in [place] and 1 in [place]. Pt friend notes that the pt grandchildren intermittently help her out. She states that her husband has severe medical issues with internal bleeding and is currently on oxygen. Pt reports that she completes all of the housework and outside work due to her husband being unable to complete daily activities.   Pt has intermittent GERD that she used to take Nexium for. Denies PMHx of HTN, cardiac issues. Denies taking HTN medications. Denies MI, but notes that she  was evaluated for CP symptoms and was found to have a 20% blockage in her arteries. Pt reports that she had a stroke with resolved residual right sided weakness summer 2017 that she was evaluated the next day at [facility] and was informed by her horse vet to be evaluated. Pt states that she took 2 ASA following her initial symptoms. Pt reports that she takes ASA and plavix for. She states that she stopped taking her plavix 2 weeks ago following the colonoscopy. Pt sates  that she takes Rx Lipitor for cholesterol. Pt reports that she takes multi-vitamin and that [name] recommended that she takes vitamin-D.   Pt had cholecystectomy and partial hysterectomy with one ovary left. Pt denies family hx of CA. Denies smoking cigarettes, but reports that her husband used to smoke cigarettes. Pt denies ETOH use at this time.   On review of systems, pt denies fever, chills. Endorses 10 lb weight loss, decreased energy levels. Denies pain. Pt reports constipation treated with laxatives. Pt reports that she has hemorrhoids. Pt denies abdominal pain, nausea, vomiting, abdominal distension.   CURRENT THERAPY: Observation   INTERVAL HISTORY:  [name] is here for a follow up and discussed her latest scan. She presents to the clinic today by herself. She note she is getting better since stopping chemo. She notes her fingers and feet are numb lately. She notes she is back to riding horses again but not her baseline duration. Overall she is doing well.   She notes she plans to have colonoscopy in late 09/2017 with [name].      MEDICAL HISTORY:  Past Medical History:  Diagnosis Date  . Anemia   . Arthritis   . Astigmatism    both eyes  . Cancer [facility])    colon surgery done last chemo tx 01-17-17  . Colon cancer ([place])   . Coronary artery disease, non-occlusive   . Dyspnea    more so exertion  . Dysrhythmia    skipped beats every now and then  . GERD (gastroesophageal reflux disease)   . Glaucoma    both eyes right eye worse than left  . Hammer toes of both feet   . Headache    migraines with aura  . Heel spur    both heels and front  . HSV (herpes simplex virus) infection   . Hyperglycemia   . Hyperlipidemia   . IBS (irritable bowel syndrome)   . IC (interstitial cystitis)   . Insomnia disorder related to known organic factor   . Osteoporosis   . PONV (postoperative nausea and vomiting)    has had more surgeries, and had no problems  .  Stroke ([place]) 2016  . Vitamin D deficiency     SURGICAL HISTORY: Past Surgical History:  Procedure Laterality Date  . ABDOMINAL HYSTERECTOMY     1 ovary and womb removed  . CARDIAC CATHETERIZATION  7/12   non obst dz -- not sure what yr  . CHOLECYSTECTOMY    . EUS N/A 02/03/2017   Procedure: UPPER ENDOSCOPIC ULTRASOUND (EUS) LINEAR;  Surgeon: [name], MD;  Location: [facility];  Service: Endoscopy;  Laterality: N/A;  . EYE SURGERY Bilateral    ioc for cataracts   . LAPAROSCOPIC RIGHT COLECTOMY Right 11/10/2016   Procedure: LAPAROSCOPIC EXTENDED RIGHT COLECTOMY;  Surgeon: [name], MD;  Location: [place];  Service: General;  Laterality: Right;  . PORTACATH PLACEMENT Left 12/22/2016   Procedure: INSERTION PORT-A-CATH;  Surgeon: [name],  MD;  Location: [place];  Service: General;  Laterality: Left;  . ROTATOR CUFF REPAIR Bilateral     SOCIAL HISTORY: Social History   Socioeconomic History  . Marital status: Married    Spouse name: Not on file  . Number of children: 4  . Years of education: Not on file  . Highest education level: Not on file  Occupational History  . Occupation: reitred    Fish farm manager: RETIRED  Social Needs  . Financial resource strain: Not on file  . Food insecurity:    Worry: Not on file    Inability: Not on file  . Transportation needs:    Medical: Not on file    Non-medical: Not on file  Tobacco Use  . Smoking status: Never Smoker  . Smokeless tobacco: Never Used  Substance and Sexual Activity  . Alcohol use: No    Alcohol/week: 0.0 oz  . Drug use: No  . Sexual activity: Not on file  Lifestyle  . Physical activity:    Days per week: Not on file    Minutes per session: Not on file  . Stress: Not on file  Relationships  . Social connections:    Talks on phone: Not on file    Gets together: Not on file    Attends religious service: Not on file    Active member of club or organization: Not on file    Attends  meetings of clubs or organizations: Not on file    Relationship status: Not on file  . Intimate partner violence:    Fear of current or ex partner: Not on file    Emotionally abused: Not on file    Physically abused: Not on file    Forced sexual activity: Not on file  Other Topics Concern  . Not on file  Social History Narrative   Rides horses         Specialty providers    [facility]   Cardio- [facility]   Ortho--[place]   Rheum-[facility]   urol-[facility]   [facility]-- [facility]    FAMILY HISTORY: Family History  Problem Relation Age of Onset  . Alcohol abuse Mother   . Heart attack Mother   . GER disease Mother   . GER disease Father   . Alcohol abuse Father   . Stroke Father   . Tuberculosis Father   . Heart attack Father   . Aneurysm Father   . Mental illness Sister   . Depression Sister   . Breast cancer Neg Hx     ALLERGIES:  is allergic to salmon [fish allergy]; ampicillin; codeine; flagyl [metronidazole]; meperidine hcl; neomycin; penicillins; propoxyphene hcl; sulfa antibiotics; and tramadol hcl.  MEDICATIONS:  Current Outpatient Medications  Medication Sig Dispense Refill  . atorvastatin (LIPITOR) 40 MG tablet Take 1 tablet (40 mg total) by mouth daily at 6 PM. 90 tablet 3  . b complex vitamins tablet Take 1 tablet by mouth daily.    . Cholecalciferol (VITAMIN D) 2000 units tablet Take 2,000 Units by mouth 3 (three) times a week.     Marland Kitchen ELIQUIS 5 MG TABS tablet TAKE 1 TABLET BY MOUTH TWICE A DAY 60 tablet 2  . gabapentin (NEURONTIN) 100 MG capsule TAKE 1 CAPSULE (100 MG TOTAL) BY MOUTH 2 (TWO) TIMES DAILY AS NEEDED. 30 capsule 2  . Ibuprofen-Diphenhydramine Cit (IBUPROFEN PM) 200-38 MG TABS Take 1 tablet by mouth daily as needed.     . lactose free nutrition (BOOST) LIQD Take  237 mLs by mouth daily as needed.     . lidocaine-prilocaine (EMLA) cream Apply to affected area once (Patient taking differently: Apply to affected area once (near port)) 30 g 3  . Multiple  Vitamin (MULTIVITAMIN WITH MINERALS) TABS tablet Take 1 tablet by mouth daily.    . polyethylene glycol (MIRALAX / GLYCOLAX) packet Take 17 g by mouth daily. (Patient taking differently: Take 17 g by mouth daily as needed. ) 14 each 0  . polyethylene glycol-electrolytes (NULYTELY/GOLYTELY) 420 g solution Take as directed for colonoscopy. 4000 mL 0  . potassium chloride SA (K-DUR,KLOR-CON) 20 MEQ tablet Take 1 tablet (20 mEq total) by mouth daily. 90 tablet 3   No current facility-administered medications for this visit.     REVIEW OF SYSTEMS:  Constitutional: Denies fevers, chills or abnormal night sweats   Eyes: Denies blurriness of vision, double vision or watery eyes Ears, nose, mouth, throat, and face: Denies mucositis or sore throat (+) hard of Hearing (+) intermittent vertigo Respiratory: Denies cough, dyspnea or wheezes Cardiovascular: Denies palpitation, chest discomfort  Gastrointestinal:  Denies nausea, heartburn Skin: Denies abnormal skin rashes Lymphatics: Denies new lymphadenopathy or easy bruising Neurological:Denies tingling or new weaknesses (+) b/l fingers and feet numbness, improved Behavioral/Psych: Mood is stable, no new changes  All other systems were reviewed with the patient and are negative.  PHYSICAL EXAMINATION: ECOG PERFORMANCE STATUS: 1  Vitals:   09/21/17 1121  BP: (!) 136/55  Pulse: 71  Resp: 18  Temp: 98.4 F (36.9 C)  SpO2: 100%   Filed Weights   09/21/17 1121  Weight: 152 lb 9.6 oz (69.2 kg)     GENERAL:alert, no distress and comfortable SKIN: skin color, texture, turgor are normal, no rashes or significant lesions EYES: normal, conjunctiva are pink and non-injected, sclera clear OROPHARYNX:no exudate, no erythema and lips, buccal mucosa, and tongue normal  NECK: supple, thyroid normal size, non-tender, without nodularity LYMPH:  no palpable lymphadenopathy in the cervical, axillary or inguinal LUNGS: clear to auscultation and percussion  with normal breathing effort HEART: regular rate & rhythm and no murmurs and no lower extremity edema ABDOMEN:abdomen soft, non-tender and normal bowel sounds. No organomegaly noted. (+) midline incision above umbilical , well healed, non-tender, no scar tissue.  Musculoskeletal:no cyanosis of digits and no clubbing  PSYCH: alert & oriented x 3 with fluent speech NEURO: no focal motor/sensory deficits   LABORATORY DATA:  I have reviewed the data as listed CBC Latest Ref Rng & Units 09/19/2017 07/28/2017 06/20/2017  WBC 3.9 - 10.3 K/uL 7.5 8.1 5.2  Hemoglobin 11.6 - 15.9 g/dL 12.6 12.7 10.3(L)  Hematocrit 34.8 - 46.6 % 38.4 38.4 30.7(L)  Platelets 145 - 400 K/uL 223 237.0 190    CMP Latest Ref Rng & Units 09/19/2017 07/28/2017 06/20/2017  Glucose 70 - 99 mg/dL 90 97 95  BUN 8 - 23 mg/dL 26(H) 28(H) 22  Creatinine 0.44 - 1.00 mg/dL 0.87 0.98 0.81  Sodium 135 - 145 mmol/L 135 140 140  Potassium 3.5 - 5.1 mmol/L 4.4 4.1 3.6  Chloride 98 - 111 mmol/L 102 107 107  CO2 22 - 32 mmol/L '27 25 26  '$ Calcium 8.9 - 10.3 mg/dL 9.3 9.7 9.4  Total Protein 6.5 - 8.1 g/dL 7.0 6.8 6.4  Total Bilirubin 0.3 - 1.2 mg/dL 0.7 0.5 1.0  Alkaline Phos 38 - 126 U/L 102 86 90  AST 15 - 41 U/L '27 21 21  '$ ALT 0 - 44 U/L 32 24 19  CEA  08/31/16: 2.0  09/21/17: PENDING   PATHOLOGY REPORT:  Diagnosis 11/10/16 Colon, segmental resection for tumor, Ascending and Transverse ADENOCARCINOMA OF THE ASCENDING COLON WITH EXTRA CELLULAR MUCIN, GRADE 3, SIZE 3.4 CM THE TUMOR INVADES MUSCULARIS PROPRIA POORLY DIFFERENTIATED ADENOCARCINOMA OF THE TRANSVERSE COLON, GRADE 4, SIZE 6.1 CM THE TUMOR INVADES THROUGH MUSCULARIS PROPRIA INTO PERICOLONIC SOFT TISSUE EXTRAMURAL SATELLITE TUMOR NODULES IS IDENTIFIED (X1) ALL MARGINS OF RESECTION ARE NEGATIVE FOR CARCINOMA METASTATIC CARCINOMA IN ONE OF SIXTY LYMPH NODES (1/60) TABULAR ADENOMA X1 Microscopic Comment COLON AND RECTUM (INCLUDING TRANS-ANAL RESECTION): Specimen: Right and  transverse colon Procedure: Segmental resection Tumor site: Ascending and transverse colon Specimen integrity: Intact Macroscopic intactness of mesorectum: Not applicable: X Complete: NA Near complete: NA Incomplete: NA Cannot be determined (specify): NA Macroscopic tumor perforation: Pericolonic soft tissue Invasive tumor: Maximum size: 6.1 cm and 3.4 cm Histologic type(s): Adenocarcinoma Histologic grade and differentiation: G1: well differentiated/low grade Microscopic Comment(continued) G2: moderately differentiated/low grade G3: poorly differentiated/high grade G4: undifferentiated/high grade Type of polyp in which invasive carcinoma arose: Tubular adenoma Microscopic extension of invasive tumor: Pericolonic soft tissue Lymph-Vascular invasion: Identified Peri-neural invasion: Negative Tumor deposit(s) (discontinuous extramural extension): Present Resection margins: Proximal margin: Negative Distal margin: Negative Circumferential (radial) (posterior ascending, posterior descending; lateral and posterior mid-rectum; and entire lower 1/3 rectum): Negative Mesenteric margin (sigmoid and transverse): Negative Distance closest margin (if all above margins negative): 1.5 cm from the mesenteric margin Trans-anal resection margins only: Deep margin: NA Mucosal Margin: NA Distance closest mucosal margin (if negative): NA Treatment effect (neo-adjuvant therapy): Negative Additional polyp(s): Tubular adenoma Non-neoplastic findings: Unremarkble Lymph nodes: number examined 60; number positive: 1 Pathologic Staging: pT3 (m), pN1c, pMx Ancillary studies: Ordered    Colon biopsy, 08/31/16 Diagnosis 1. Surgical [P], hepatic flexure - ADENOCARCINOMA. Moderately differentiated.  2. Surgical [P], transverse - ADENOCARCINOMA, Moderately differentiated.  RADIOGRAPHIC STUDIES: I have personally reviewed the radiological images as listed and agreed with the findings in the  report.  CT CAP W Contrast 06/13/17 IMPRESSION: 1. Continued regression of hepatic metastatic disease. No new or progressive findings. 2. Surgical changes from a right hemicolectomy. No findings for recurrent cancer. No mesenteric or retroperitoneal mass or Adenopathy.  Ct Abdomen Pelvis W Contrast  Result Date: 09/19/2017 CLINICAL DATA:  Followup colon cancer.  Status post chemotherapy. EXAM: CT ABDOMEN AND PELVIS WITH CONTRAST TECHNIQUE: Multidetector CT imaging of the abdomen and pelvis was performed using the standard protocol following bolus administration of intravenous contrast. CONTRAST:  173m ISOVUE-300 IOPAMIDOL (ISOVUE-300) INJECTION 61%, <See Chart> ISOVUE-300 IOPAMIDOL (ISOVUE-300) INJECTION 61% COMPARISON:  06/13/2017 FINDINGS: Lower chest: No acute abnormality. Hepatobiliary: The index lesion within the inferior right lobe of liver measures 1.8 x 1.0 x 1.0 cm, image 31/2. Previously 1.7 x 0.8 x 0.6 cm. Small low-density structure within segment 7 measures 4 mm, image 12/2. Previously this measured the same. No new liver lesions identified. Previous cholecystectomy. No biliary dilatation. Pancreas: Unremarkable. No pancreatic ductal dilatation or surrounding inflammatory changes. Spleen: Normal in size without focal abnormality. Adrenals/Urinary Tract: Normal appearance of the adrenal glands. Bilateral kidney cysts identified. No mass or hydronephrosis identified. The urinary bladder appears normal. Stomach/Bowel: Stomach and the small bowel loops have a normal course and caliber. Status post right hemicolectomy. No pathologic dilatation of the bowel loops. Vascular/Lymphatic: Mild aortic atherosclerosis. No adenopathy identified within the abdomen or pelvis. Reproductive: Status post hysterectomy. No adnexal masses. Other: Small fat containing periumbilical hernia. No free fluid or fluid collections identified. Musculoskeletal: No  aggressive lytic or sclerotic bone lesions identified.  IMPRESSION: 1. The inferior right lobe of liver lesion appears slightly increased in size when compared with the previous exam. 2. No new liver lesions identified. 3. Stable appearance of the colon status post right hemicolectomy. 4.  Aortic Atherosclerosis (ICD10-I70.0). Electronically Signed   By: Kerby Moors M.D.   On: 09/19/2017 15:28    PROCEDURES   COLONOSCOPY 08/31/2016 DR. JACOBS  - 1) Polypoid mass at the hepatic flexure, 2cm, soft but clearly neoplastic. This was biopsied and the site was injected with Spot. - 2) Clearly malignant mass in the distal transverse colon; 4cm, firm, 1/2 cirumference, ulcerated. This was biopsied and the site was also injected with Spot. - 3) Four small (3-31m) scattered typical appearing adenomas throughout the colon, not removed since she mistakenly stayed on her plavix for this procedure.  ASSESSMENT & PLAN:   VElese Rane is a 77y.o. caucasian female with colorectal adenocarcinoma, moderately differentiated, found on screening colonoscopy.   1. Synchronized colon adenocarcinoma, moderately differentiated, in hepatic fracture and distal transverse colon, pT3(m)N1cMx with indeterminate liver lesions, at least stage IIIB, MSI-H -I previously reviewed her colonoscopy, biopsy and CT and MRI image findings with patient in great details. -She has to synchronized colon mass in the hepatic flexure and distal transverse colon, post biopsy showed moderately-differentiated adenocarcinoma -CT and abdominal MRI showed 2 lesions in right lobe of liver, at least one lesion is very suspicious for metastatic disease, and enlarged periportal lymph nodes.  -We previously discussed 10/04/16 PET. There is no definitive distant metastasis on the PET scan. There was no uptake in her liver, liver metastasis is less likely, although not ruled out. There is a hypermetabolic lymph node in the portacaval region measuring 1.5 cm with SUV 16.7, compatible with  malignancy. -12/28/16 MRI of abdomen revealed no significant change in liver lesions, but noted slightly enlarged periportal lymphadenopathy from previous scan, suspicious for nodal metastasis. -I previously recommend EUS biopsy of the portacaval lymph node, which was negative  -She completed 7 cycles of chemotherapy FOLFOX 12/29/17-04/11/17, it became more difficult for her continue treatment, due to side effects and she previously requested to stop IV chemo because of financial burden and because she wants to get back to her life and her horses.  -Since metastatic disease is not ruled out, I recommend her to continue chemotherapy a little longer, for at least a total of 6 months. FOLFOX was switched to Xeloda on 04/26/17. -Although she is tolerated Xeloda well overall, she had mild side effects, and one episode of vomiting, she is very reluctant to continue chemo for much longer and stopped Xeloda on 06/12/17.  -Down the road, if she has further disease progression, or intolerance to chemo, we can consider immunotherapy with Keytruda due to her MSI high disease. -CT CAP W Contrast from 06/13/17 revealed continued regression of hepatic lesions, these are concerning for metastatic disease.  -We discussed her CT AP from 09/19/17 which shows her liver lesions have increased slightly since previous scan, otherwise her scan is stable with no new lesions. I recommend MRI of abdomen to see if these lesions are suspicious for metastatic cancer. She is agreeable.  -I briefly discussed if liver lesions are suspicious for cancer we may obtain biopsy to confirm  -This week's labs reviewed, anemia resolved and overall WNL.  -She notes she plans to have colonoscopy in late 09/2017 by Dr. JArdis Hughs -F/u in 2 weeks after MRI    2.  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 I previously recommend she sees a Dietitian to rule out lynch syndrome. She agreed to see genetics  -Had genetics apt on  1/2, she consented to genetics testing -genetic testing were negative for Lynch Syndrome   3. 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 Liver lesions  -Her initial staging CT scan showed numerous small liver lesions, indeterminate, those are not hypermetabolic on PET scan. -Previously enlarged portal node was negative for metastasis, but also appears to have shrunk while on chemotherapy -CT AP  from 06/13/17 revealed continued regression of hepatic metastatic disease -CT AP from 09/19/17 shows liver lesions slightly increased in size since last scan. Pt agreed to abdomen MRI for further evaluation.   5. History of stroke  -No residual neuro deficits. She functions well at home. -she is on aspirin and Plavix, will continue.  6. 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  -abd MRI in next 2 weeks  -F/u in 2 weeks after MRI    Orders Placed This Encounter  Procedures  . MR Abdomen W Wo Contrast    Evaluate liver lesions seen on CT    Standing Status:   Future    Standing Expiration Date:   11/22/2018    Order Specific Question:   If indicated for the ordered procedure, I authorize the administration of contrast media per Radiology protocol    Answer:   Yes    Order Specific Question:   What is the patient's sedation requirement?    Answer:   No Sedation    Order Specific Question:   Does the patient have  a pacemaker or implanted devices?    Answer:   No    Order Specific Question:   Radiology Contrast Protocol - do NOT remove file path    Answer:   [email or web address]    Order Specific Question:   Preferred imaging location?    Answer:   [facility] (table limit-350 lbs)    All questions were answered. The patient knows to call the clinic with any problems, questions or concerns.  I spent 20 minutes counseling the patient face to face. The total time spent in the appointment was 25 minutes and more than 50% was on counseling.  Oneal Deputy, am acting as scribe for [name], MD.   I have reviewed the above documentation for accuracy and completeness, and I agree with the above.     [name], MD 09/21/2017

## 2017-09-23 ENCOUNTER — Telehealth: Payer: Self-pay | Admitting: Hematology

## 2017-09-23 NOTE — Telephone Encounter (Signed)
Faxed medical records to [facility] at [phone], Release ID: [id number]

## 2017-10-01 ENCOUNTER — Ambulatory Visit (HOSPITAL_COMMUNITY)
Admission: RE | Admit: 2017-10-01 | Discharge: 2017-10-01 | Disposition: A | Discharge status: Home / Self Care | Payer: Medicare HMO | Source: Ambulatory Visit | Attending: Hematology | Admitting: Hematology

## 2017-10-01 DIAGNOSIS — C188 Malignant neoplasm of overlapping sites of colon: Secondary | ICD-10-CM | POA: Diagnosis not present

## 2017-10-01 DIAGNOSIS — K769 Liver disease, unspecified: Secondary | ICD-10-CM | POA: Insufficient documentation

## 2017-10-01 DIAGNOSIS — C189 Malignant neoplasm of colon, unspecified: Secondary | ICD-10-CM | POA: Diagnosis not present

## 2017-10-01 MED ORDER — GADOBENATE DIMEGLUMINE 529 MG/ML IV SOLN
15.0000 mL | Freq: Once | INTRAVENOUS | Status: AC | PRN
  Administered 2017-10-01: 15 mL via INTRAVENOUS

## 2017-10-03 NOTE — Progress Notes (Signed)
[name]  [phone] [phone]  Clinic Follow-up Note   Patient Care Team: Tower, [name], MD as PCP - General [name], MD as Consulting Physician (Hematology) [name], MD as Attending Physician (Gastroenterology) [name], MD as Consulting Physician (General Surgery)   Date of Service:  10/04/2017   CHIEF COMPLAINTS:  Follow up for Colorectal adenocarcinoma, moderately differentiated  Oncology History   Cancer Staging Cancer of overlapping sites of colon [facility]) Staging form: Colon and Rectum, AJCC 8th Edition - Pathologic stage from 11/10/2016: Stage IIIB (pT3(m), pN1c, cM0) - Signed by [name], MD on 12/16/2016       Cancer of overlapping sites of colon [facility])   08/31/2016 Tumor Marker    Patient's tumor was tested for the following markers: CEA. Results of the tumor marker test revealed 2.0.    08/31/2016 Pathology Results    Colon biopsy Diagnosis: 1. Surgical [P], hepatic flexure. ADENOCARCINOMA. Moderately differentiated. 2. Surgical [P], transverse. ADENOCARCINOMA, Moderately differentiated.    08/31/2016 Initial Diagnosis    Colon cancer metastasized to liver ([place])    08/31/2016 Procedure    - 1) Polypoid mass at the hepatic flexure, 2cm, soft but clearly neoplastic.  - 2) Clearly malignant mass in the distal transverse colon; 4cm, firm, 1/2 cirumference, ulcerated. ' - 3) Four small (3-31m) scattered typical appearing adenomas throughout the colon, notremoved since she mistakenly stayed on her plavix for this procedure.    09/01/2016 Imaging    CT Abdomen Pelvis W Contrast IMPRESSION: There are 2 colonic lesions involving the proximal ascending colon and proximal transverse colon, as detailed above, highly concerning for colonic neoplasm. In addition, there are at least 2 indeterminate liver lesions which are suspicious for potential metastatic disease.     09/16/2016 Imaging    Mr Liver W Wo Contrast  IMPRESSION: Lesion within central right lobe of liver exhibits of peripheral enhancement and is suspicious for liver metastasis. Lesion within caudate lobe of liver identified on recent CT does not have a corresponding signal or enhancement abnormality on today's study. Within the inferior right lobe of liver there is an enhancing structure which is favored to represent an atypical benign hemangioma with liver metastasis felt less likely. The transverse colon lesion is again identified compatible with colonic adenocarcinoma.    10/04/2016 PET scan    PET 10/04/16 IMPRESSION: 1. Highly hypermetabolic proximal transverse colon mass, maximum SUV 30.8. Small focus of hypermetabolic activity in the mesentery just above this mass probably represents a local involved lymph node. 2. Moderately hypermetabolic ascending colon mass, maximum SUV 8.7. 3. Highly hypermetabolic enlarged portacaval lymph node, maximum SUV 16.7. 4. Focus of hypermetabolic activity near the duodenum bulb could be physiologic or due to an adjacent lymph node. 5. None of the liver lesions are discernibly hypermetabolic. Particularly the more cephalad right hepatic lobe lesion merits surveillance, it had nonspecific enhancement characteristics on prior cross-sectional imaging and only a thin enhancing wall which could conceivably predispose to false negative due to central necrosis. 6. Several tiny chronic pulmonary nodules are likely benign. 7. Other imaging findings of potential clinical significance: Aortic Atherosclerosis (ICD10-I70.0). Coronary atherosclerosis. Bilateral nonobstructive nephrolithiasis.    11/10/2016 Surgery    LAPAROSCOPIC EXTENDED RIGHT COLECTOMY by Dr. [name]    11/10/2016 Pathology Results    Diagnosis 11/10/16 Colon, segmental resection for tumor, Ascending and Transverse ADENOCARCINOMA OF THE ASCENDING COLON WITH EXTRA CELLULAR MUCIN, GRADE 3, SIZE 3.4 CM THE TUMOR INVADES MUSCULARIS PROPRIA POORLY  DIFFERENTIATED ADENOCARCINOMA OF THE TRANSVERSE COLON, GRADE 4, SIZE 6.1 CM THE TUMOR INVADES THROUGH MUSCULARIS PROPRIA INTO PERICOLONIC SOFT TISSUE EXTRAMURAL SATELLITE TUMOR NODULES IS IDENTIFIED (X1) ALL MARGINS OF RESECTION ARE NEGATIVE FOR CARCINOMA METASTATIC CARCINOMA IN ONE OF SIXTY LYMPH NODES (1/60) TABULAR ADENOMA X1    12/28/2016 Imaging    MRI abdomen w/wo contrast IMPRESSION: Motion degraded images.  Two indeterminate right liver lesions measuring up to 1.6 cm, as described above. Continued attention on follow-up is suggested.  Two periportal lymph nodes measuring up to 1.9 cm, suspicious for nodal metastases.  Postsurgical changes related to right colon resection.     12/29/2016 - 04/11/2017 Chemotherapy    FOLFOX every 2 weeks starting 12/29/16 last cycle 7 on 04/11/17.     02/03/2017 Procedure    EUS Per [name] Endoscopic Finding Findings: The examined esophagus was endoscopically normal. The entire examined stomach was endoscopically normal. The examined duodenum was endoscopically normal. Endosonographic Finding (limited evaluation for tissue acquisition): 1. One enlarged lymph node (vs soft tissue mass) was visualized in the porta hepatis region. It measured 16 mm in maximal cross-sectional diameter. The node (vs soft tissue) was oval, isoechoic and had poorly defined margins. Fine needle aspiration for cytology was performed. Color Doppler imaging was utilized prior to needle puncture to confirm a lack of significant vascular structures within the needle path. Threepasses were made with the 25 gauge needle using a transgastric approach. 2. CBD was normal, non-dilated 3. Limited views of the liver, spleen, pancreas were all normal.  Impression: One periportal soft tissue mass (lymphnode?) measuring 35m was noted and sampled with FNA.    02/03/2017 Pathology Results    Diagnosis FINE NEEDLE ASPIRATION, ENDOSCOPIC, PERI PORTAL NODE (SPECIMEN 1 OF 1  COLLECTED 02/03/17): NO MALIGNANT CELLS IDENTIFIED. SCANT LYMPHOID AND BENIGN GLANDULAR ELEMENTS.    03/09/2017 Imaging    IMPRESSION: Interval decrease in several tiny low-attenuation liver lesions the, consistent with improving hepatic metastases.  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  TomorNext Lynch+ cancer Next was ordered throught he laboratory [facility]  TumorNext Lynch with CancerNext: Paired Germline and Tumor Analyses for EnhancedDiagnosis of Lynch Syndrome plus Analyses of 29 Additional Genes Associated with Hereditary Cancer.  Germline Genes analyzed: MLH1, MSH2, MSH6, PMS2, APC, ATM, BARD1, BMPR1A, BRIP1, CDH1, CDKN2A, CHEK2, DICER1, MRE11A, MUTYH, NBN, PALB2, PTEN, RAD50, RAD51C, RAD51D, SMAD4, STK11, TP53, CDK4, NF1, BRCA1, BRCA2, POLD1, POLE, SMARCA4, HOXB13 (sequencing and deletion/duplication); EPCAM, GREM1 (deletion/duplication only).  Results: Germline- Negative, no variants detected.  Tumor/Somatic-  BRAF V600E detected, No variants in KRAS or NRAS detected.  MLH1 promoter hypermethylation present, Microsatellite High.   Somatic pathogenic mutations in MSH2 c.687delA and pathogenic mutation in MSH6 c.3261dupC.  2 Somatic Variants of uncertain significance in MSH2 called p.A123T and p.D654N.   The date of this test report is 04/07/2017.      04/26/2017 - 06/12/2017 Chemotherapy    Xeloda 20019min the AM and 150099mn the PM started on 04/26/17. Changed to 1500m37mD on 05/16/17    06/13/2017 Imaging     CT CAP W Contrast 06/13/17 IMPRESSION: 1. Continued regression of hepatic metastatic disease. No new or progressive findings. 2. Surgical changes from a right hemicolectomy. No findings for recurrent cancer. No  mesenteric or retroperitoneal mass  or Adenopathy.    09/19/2017 Imaging    CT AP W Contrast 09/19/17  IMPRESSION: 1. The inferior right lobe of liver lesion appears slightly increased in size when compared with the previous exam. 2. No new liver lesions identified. 3. Stable appearance of the colon status post right hemicolectomy. 4.  Aortic Atherosclerosis (ICD10-I70.0).     10/01/2017 Imaging    MRI Abdomen W WO Contrast IMPRESSION: 1. Two small, subtle liver lesions are again identified involving segment 7 and segment 6. When compared with the MRI from 12/28/2016 these are not significantly changed. Compared with the MRI from 09/15/2016 the peripherally enhancing lesion within segment 7 has decreased in size in the interval. There has been no significant change in the segment 6 lesion. 2. No new foci of suspected metastasis identified.     HISTORY OF PRESENTING ILLNESS: 09/23/16 [name] 77 y.o. female is here because of Colorectal adenocarcinoma, moderately differentiated  Referred by Gastroenterologist, [name] for abnormal colon biopsy. Accompanied by family friend.   Pt initially presented with one episode of rectal bleeding one year ago, but did not pursue further workup until lately, she was referred to Gastroenterologist, [name] on 07/13/16 for Routine colonoscopy. She was referred to him by [name] for repeat colonoscopy for colon CA screening. Pt last colonoscopy was with [name] in August 2011 that was completed for hx of polyps and was significant for hemorrhoids and otherwise negative.   On 08/31/16, pt underwent screening colonoscopy, which unfortunately showed 2 masses in the proximal and distant transverse colon, biopsy both showed moderately differentiated adenocarcinoma.   Pt had a CT Abdomen Pelvis that was ordered by [name] and completed on 09/01/16 and significant for colonic neoplasm and 2 indeterminate liver lesions suspicious for  potential metastatic dx.   Pt had a CT Chest W Constrast that was ordered by [name] and completed on 09/06/16 and significant for no metastasis.    Pt had MR Liver W WO Contrast that was ordered by [name] and completed on 09/16/16 and significant for colonic adenocarcinoma and indeterminate liver lesions.  Pt notes that she was referred to the office by [name]. Pt has an appointment with Surgeon, [name] tomorrow, 8/3 to discuss her options. She notes that [name] recommended a liver biopsy. Pt states that she had 1 year ago she had one large episode of rectal bleeding following consuming a milkshake, 1 episode of emesis, and a large episode of rectal bleeding that resolved on its own. Denies recurrent bleeding following initial rectal bleeding. Denies being evaluated last year for her rectal bleeding due to having issues with finding a GI specialist. Pt last colonoscopy was 7 years ago. Pt reports that due to her chronic constipation, she uses suppositories and OTC laxatives. She states that she eats mainly meat and starches due to her dislike for vegetables. She notes that she recently lost approximately 10 lbs over the past year. 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 She states that she lives with her husband and has 3 older children, 2 in [place] and 1 in [place]. Pt friend notes that the pt grandchildren intermittently help her out. She states that her husband has severe medical issues with internal bleeding and is currently on oxygen. Pt reports that she completes all  of the housework and outside work due to her husband being unable to complete daily activities.   Pt has intermittent GERD that she used to take Nexium for. Denies PMHx of HTN, cardiac issues. Denies taking HTN medications. Denies MI, but notes that she was evaluated for CP symptoms and was found to have  a 20% blockage in her arteries. Pt reports that she had a stroke with resolved residual right sided weakness summer 2017 that she was evaluated the next day at [facility] and was informed by her horse vet to be evaluated. Pt states that she took 2 ASA following her initial symptoms. Pt reports that she takes ASA and plavix for. She states that she stopped taking her plavix 2 weeks ago following the colonoscopy. Pt sates that she takes Rx Lipitor for cholesterol. Pt reports that she takes multi-vitamin and that [name] recommended that she takes vitamin-D.   Pt had cholecystectomy and partial hysterectomy with one ovary left. Pt denies family hx of CA. Denies smoking cigarettes, but reports that her husband used to smoke cigarettes. Pt denies ETOH use at this time.   On review of systems, pt denies fever, chills. Endorses 10 lb weight loss, decreased energy levels. Denies pain. Pt reports constipation treated with laxatives. Pt reports that she has hemorrhoids. Pt denies abdominal pain, nausea, vomiting, abdominal distension.   CURRENT THERAPY: Observation   INTERVAL HISTORY:  Samiya Mervin is here for a follow up and discuss her latest scan. She presents to the clinic today by herself. She notes she is doing well overall. She plans to see [name] soon for her endoscopy.  Her port has not been accessed for a while, she is will to get flush today.    MEDICAL HISTORY:  Past Medical History:  Diagnosis Date  . Anemia   . Arthritis   . Astigmatism    both eyes  . Cancer [facility])    colon surgery done last chemo tx 01-17-17  . Colon cancer ([place])   . Coronary artery disease, non-occlusive   . Dyspnea    more so exertion  . Dysrhythmia    skipped beats every now and then  . GERD (gastroesophageal reflux disease)   . Glaucoma    both eyes right eye worse than left  . Hammer toes of both feet   . Headache    migraines with aura  . Heel spur    both heels and front  . HSV (herpes  simplex virus) infection   . Hyperglycemia   . Hyperlipidemia   . IBS (irritable bowel syndrome)   . IC (interstitial cystitis)   . Insomnia disorder related to known organic factor   . Osteoporosis   . PONV (postoperative nausea and vomiting)    has had more surgeries, and had no problems  . Stroke ([place]) 2016  . Vitamin D deficiency     SURGICAL HISTORY: Past Surgical History:  Procedure Laterality Date  . ABDOMINAL HYSTERECTOMY     1 ovary and womb removed  . CARDIAC CATHETERIZATION  7/12   non obst dz -- not sure what yr  . CHOLECYSTECTOMY    . EUS N/A 02/03/2017   Procedure: UPPER ENDOSCOPIC ULTRASOUND (EUS) LINEAR;  Surgeon: [name], MD;  Location: [facility];  Service: Endoscopy;  Laterality: N/A;  . EYE SURGERY Bilateral    ioc for cataracts   . LAPAROSCOPIC RIGHT COLECTOMY Right 11/10/2016   Procedure: LAPAROSCOPIC EXTENDED RIGHT COLECTOMY;  Surgeon: [name], MD;  Location: [facility] OR;  Service: General;  Laterality: Right;  . PORTACATH PLACEMENT Left 12/22/2016   Procedure: INSERTION PORT-A-CATH;  Surgeon: [name], MD;  Location: [place];  Service: General;  Laterality: Left;  . ROTATOR CUFF REPAIR Bilateral     SOCIAL HISTORY: Social History   Socioeconomic History  . Marital status: Married    Spouse name: Not on file  . Number of children: 4  . Years of education: Not on file  . Highest education level: Not on file  Occupational History  . Occupation: reitred    Fish farm manager: RETIRED  Social Needs  . Financial resource strain: Not on file  . Food insecurity:    Worry: Not on file    Inability: Not on file  . Transportation needs:    Medical: Not on file    Non-medical: Not on file  Tobacco Use  . Smoking status: Never Smoker  . Smokeless tobacco: Never Used  Substance and Sexual Activity  . Alcohol use: No    Alcohol/week: 0.0 standard drinks  . Drug use: No  . Sexual activity: Not on file  Lifestyle  . Physical  activity:    Days per week: Not on file    Minutes per session: Not on file  . Stress: Not on file  Relationships  . Social connections:    Talks on phone: Not on file    Gets together: Not on file    Attends religious service: Not on file    Active member of club or organization: Not on file    Attends meetings of clubs or organizations: Not on file    Relationship status: Not on file  . Intimate partner violence:    Fear of current or ex partner: Not on file    Emotionally abused: Not on file    Physically abused: Not on file    Forced sexual activity: Not on file  Other Topics Concern  . Not on file  Social History Narrative   Rides horses         Specialty providers    [facility]   Cardio- [place]   Ortho--[place]   [facility]   urol-[facility]   [facility]-- [facility]    FAMILY HISTORY: Family History  Problem Relation Age of Onset  . Alcohol abuse Mother   . Heart attack Mother   . GER disease Mother   . GER disease Father   . Alcohol abuse Father   . Stroke Father   . Tuberculosis Father   . Heart attack Father   . Aneurysm Father   . Mental illness Sister   . Depression Sister   . Breast cancer Neg Hx     ALLERGIES:  is allergic to salmon [fish allergy]; ampicillin; codeine; flagyl [metronidazole]; meperidine hcl; neomycin; penicillins; propoxyphene hcl; sulfa antibiotics; and tramadol hcl.  MEDICATIONS:  Current Outpatient Medications  Medication Sig Dispense Refill  . atorvastatin (LIPITOR) 40 MG tablet Take 1 tablet (40 mg total) by mouth daily at 6 PM. 90 tablet 3  . b complex vitamins tablet Take 1 tablet by mouth daily.    . Cholecalciferol (VITAMIN D) 2000 units tablet Take 2,000 Units by mouth 3 (three) times a week.     Marland Kitchen ELIQUIS 5 MG TABS tablet TAKE 1 TABLET BY MOUTH TWICE A DAY 60 tablet 2  . gabapentin (NEURONTIN) 100 MG capsule TAKE 1 CAPSULE (100 MG TOTAL) BY MOUTH 2 (TWO) TIMES DAILY AS NEEDED. 30 capsule 2  . Ibuprofen-Diphenhydramine  Cit (IBUPROFEN PM) 200-38 MG TABS Take 1 tablet by mouth daily as needed.     . lactose free nutrition (BOOST) LIQD Take 237 mLs by mouth daily as needed.     . lidocaine-prilocaine (EMLA) cream Apply to affected area once (Patient taking differently: Apply to affected area once (near port)) 30 g 3  . Multiple Vitamin (MULTIVITAMIN WITH MINERALS) TABS tablet Take 1 tablet by mouth daily.    . polyethylene glycol (MIRALAX / GLYCOLAX) packet Take 17 g by mouth daily. (Patient taking differently: Take 17 g by mouth daily as needed. ) 14 each 0  . polyethylene glycol-electrolytes (NULYTELY/GOLYTELY) 420 g solution Take as directed for colonoscopy. 4000 mL 0  . potassium chloride SA (K-DUR,KLOR-CON) 20 MEQ tablet Take 1 tablet (20 mEq total) by mouth daily. 90 tablet 3   Current Facility-Administered Medications  Medication Dose Route Frequency Provider Last Rate Last Dose  . heparin lock flush 100 unit/mL  500 Units Intravenous Once [name], MD      . sodium chloride flush (NS) 0.9 % injection 10 mL  10 mL Intravenous PRN [name], MD        REVIEW OF SYSTEMS:  Constitutional: Denies fevers, chills or abnormal night sweats   Eyes: Denies blurriness of vision, double vision or watery eyes Ears, nose, mouth, throat, and face: Denies mucositis or sore throat (+) hard of Hearing (+) intermittent vertigo Respiratory: Denies cough, dyspnea or wheezes Cardiovascular: Denies palpitation, chest discomfort  Gastrointestinal:  Denies nausea, heartburn Skin: Denies abnormal skin rashes Lymphatics: Denies new lymphadenopathy or easy bruising Neurological:Denies tingling or new weaknesses (+) b/l fingers and feet numbness  Behavioral/Psych: Mood is stable, no new changes  All other systems were reviewed with the patient and are negative.  PHYSICAL EXAMINATION: ECOG PERFORMANCE STATUS: 1  Vitals:   10/04/17 0959  BP: (!) 131/55  Pulse: 68  Resp: 14  Temp: 97.8 F (36.6 C)  SpO2: 98%   Filed  Weights   10/04/17 0959  Weight: 150 lb 12.8 oz (68.4 kg)     GENERAL:alert, no distress and comfortable SKIN: skin color, texture, turgor are normal, no rashes or significant lesions EYES: normal, conjunctiva are pink and non-injected, sclera clear OROPHARYNX:no exudate, no erythema and lips, buccal mucosa, and tongue normal  NECK: supple, thyroid normal size, non-tender, without nodularity LYMPH:  no palpable lymphadenopathy in the cervical, axillary or inguinal LUNGS: clear to auscultation and percussion with normal breathing effort HEART: regular rate & rhythm and no murmurs and no lower extremity edema ABDOMEN:abdomen soft, non-tender and normal bowel sounds. No organomegaly noted. (+) midline incision above umbilical , well healed, non-tender, no scar tissue.  Musculoskeletal:no cyanosis of digits and no clubbing  PSYCH: alert & oriented x 3 with fluent speech NEURO: no focal motor/sensory deficits   LABORATORY DATA:  I have reviewed the data as listed CBC Latest Ref Rng & Units 09/19/2017 07/28/2017 06/20/2017  WBC 3.9 - 10.3 K/uL 7.5 8.1 5.2  Hemoglobin 11.6 - 15.9 g/dL 12.6 12.7 10.3(L)  Hematocrit 34.8 - 46.6 % 38.4 38.4 30.7(L)  Platelets 145 - 400 K/uL 223 237.0 190    CMP Latest Ref Rng & Units 09/19/2017 07/28/2017 06/20/2017  Glucose 70 - 99 mg/dL 90 97 95  BUN 8 - 23 mg/dL 26(H) 28(H) 22  Creatinine 0.44 - 1.00 mg/dL 0.87 0.98 0.81  Sodium 135 - 145 mmol/L 135 140 140  Potassium 3.5 - 5.1 mmol/L 4.4 4.1 3.6  Chloride 98 - 111  mmol/L 102 107 107  CO2 22 - 32 mmol/L '27 25 26  ' Calcium 8.9 - 10.3 mg/dL 9.3 9.7 9.4  Total Protein 6.5 - 8.1 g/dL 7.0 6.8 6.4  Total Bilirubin 0.3 - 1.2 mg/dL 0.7 0.5 1.0  Alkaline Phos 38 - 126 U/L 102 86 90  AST 15 - 41 U/L '27 21 21  ' ALT 0 - 44 U/L 32 24 19    CEA  08/31/16: 2.0    PATHOLOGY REPORT:  Diagnosis 11/10/16 Colon, segmental resection for tumor, Ascending and Transverse ADENOCARCINOMA OF THE ASCENDING COLON WITH EXTRA  CELLULAR MUCIN, GRADE 3, SIZE 3.4 CM THE TUMOR INVADES MUSCULARIS PROPRIA POORLY DIFFERENTIATED ADENOCARCINOMA OF THE TRANSVERSE COLON, GRADE 4, SIZE 6.1 CM THE TUMOR INVADES THROUGH MUSCULARIS PROPRIA INTO PERICOLONIC SOFT TISSUE EXTRAMURAL SATELLITE TUMOR NODULES IS IDENTIFIED (X1) ALL MARGINS OF RESECTION ARE NEGATIVE FOR CARCINOMA METASTATIC CARCINOMA IN ONE OF SIXTY LYMPH NODES (1/60) TABULAR ADENOMA X1 Microscopic Comment COLON AND RECTUM (INCLUDING TRANS-ANAL RESECTION): Specimen: Right and transverse colon Procedure: Segmental resection Tumor site: Ascending and transverse colon Specimen integrity: Intact Macroscopic intactness of mesorectum: Not applicable: X Complete: NA Near complete: NA Incomplete: NA Cannot be determined (specify): NA Macroscopic tumor perforation: Pericolonic soft tissue Invasive tumor: Maximum size: 6.1 cm and 3.4 cm Histologic type(s): Adenocarcinoma Histologic grade and differentiation: G1: well differentiated/low grade Microscopic Comment(continued) G2: moderately differentiated/low grade G3: poorly differentiated/high grade G4: undifferentiated/high grade Type of polyp in which invasive carcinoma arose: Tubular adenoma Microscopic extension of invasive tumor: Pericolonic soft tissue Lymph-Vascular invasion: Identified Peri-neural invasion: Negative Tumor deposit(s) (discontinuous extramural extension): Present Resection margins: Proximal margin: Negative Distal margin: Negative Circumferential (radial) (posterior ascending, posterior descending; lateral and posterior mid-rectum; and entire lower 1/3 rectum): Negative Mesenteric margin (sigmoid and transverse): Negative Distance closest margin (if all above margins negative): 1.5 cm from the mesenteric margin Trans-anal resection margins only: Deep margin: NA Mucosal Margin: NA Distance closest mucosal margin (if negative): NA Treatment effect (neo-adjuvant therapy):  Negative Additional polyp(s): Tubular adenoma Non-neoplastic findings: Unremarkble Lymph nodes: number examined 60; number positive: 1 Pathologic Staging: pT3 (m), pN1c, pMx Ancillary studies: Ordered    Colon biopsy, 08/31/16 Diagnosis 1. Surgical [P], hepatic flexure - ADENOCARCINOMA. Moderately differentiated.  2. Surgical [P], transverse - ADENOCARCINOMA, Moderately differentiated.  RADIOGRAPHIC STUDIES: I have personally reviewed the radiological images as listed and agreed with the findings in the report.  CT CAP W Contrast 06/13/17 IMPRESSION: 1. Continued regression of hepatic metastatic disease. No new or progressive findings. 2. Surgical changes from a right hemicolectomy. No findings for recurrent cancer. No mesenteric or retroperitoneal mass or Adenopathy.  Mr Abdomen W Wo Contrast  Result Date: 10/01/2017 CLINICAL DATA:  Colon cancer.  Restaging EXAM: MRI ABDOMEN WITHOUT AND WITH CONTRAST TECHNIQUE: Multiplanar multisequence MR imaging of the abdomen was performed both before and after the administration of intravenous contrast. CONTRAST:  88m MULTIHANCE GADOBENATE DIMEGLUMINE 529 MG/ML IV SOLN COMPARISON:  MRI from 12/28/2016 and CT AP 06/13/2017. FINDINGS: Lower chest: No acute findings. Hepatobiliary: The small peripherally enhancing lesion within segment 7 measures 9 mm, image 43/902. On the MRI from 12/28/2016 this lesion measured 9 mm. On the MRI performed 09/15/2016 this lesion measured 1.3 cm. The second lesion within segment 6 is again noted and measures 1.2, image 78/902. On the MRI from 12/28/2016 this measured 1.2 cm. On the MRI from 09/15/2016 are this lesion also measured 1.2 cm. There are no new liver lesions identified. Previous cholecystectomy.  No significant biliary dilatation.  Pancreas: No mass, inflammatory changes, or other parenchymal abnormality identified. Spleen:  Within normal limits in size and appearance. Adrenals/Urinary Tract: Bilateral kidney  cysts are identified. No mass or hydronephrosis noted. Stomach/Bowel: Visualized portions within the abdomen are unremarkable. Vascular/Lymphatic: Normal appearance of the abdominal aorta. No enlarged lymph nodes within the abdomen. Other:  None. Musculoskeletal: No suspicious bone lesions identified. IMPRESSION: 1. Two small, subtle liver lesions are again identified involving segment 7 and segment 6. When compared with the MRI from 12/28/2016 these are not significantly changed. Compared with the MRI from 09/15/2016 the peripherally enhancing lesion within segment 7 has decreased in size in the interval. There has been no significant change in the segment 6 lesion. 2. No new foci of suspected metastasis identified. Electronically Signed   By: Kerby Moors M.D.   On: 10/01/2017 20:21   Ct Abdomen Pelvis W Contrast  Result Date: 09/19/2017 CLINICAL DATA:  Followup colon cancer.  Status post chemotherapy. EXAM: CT ABDOMEN AND PELVIS WITH CONTRAST TECHNIQUE: Multidetector CT imaging of the abdomen and pelvis was performed using the standard protocol following bolus administration of intravenous contrast. CONTRAST:  143m ISOVUE-300 IOPAMIDOL (ISOVUE-300) INJECTION 61%, <See Chart> ISOVUE-300 IOPAMIDOL (ISOVUE-300) INJECTION 61% COMPARISON:  06/13/2017 FINDINGS: Lower chest: No acute abnormality. Hepatobiliary: The index lesion within the inferior right lobe of liver measures 1.8 x 1.0 x 1.0 cm, image 31/2. Previously 1.7 x 0.8 x 0.6 cm. Small low-density structure within segment 7 measures 4 mm, image 12/2. Previously this measured the same. No new liver lesions identified. Previous cholecystectomy. No biliary dilatation. Pancreas: Unremarkable. No pancreatic ductal dilatation or surrounding inflammatory changes. Spleen: Normal in size without focal abnormality. Adrenals/Urinary Tract: Normal appearance of the adrenal glands. Bilateral kidney cysts identified. No mass or hydronephrosis identified. The urinary  bladder appears normal. Stomach/Bowel: Stomach and the small bowel loops have a normal course and caliber. Status post right hemicolectomy. No pathologic dilatation of the bowel loops. Vascular/Lymphatic: Mild aortic atherosclerosis. No adenopathy identified within the abdomen or pelvis. Reproductive: Status post hysterectomy. No adnexal masses. Other: Small fat containing periumbilical hernia. No free fluid or fluid collections identified. Musculoskeletal: No aggressive lytic or sclerotic bone lesions identified. IMPRESSION: 1. The inferior right lobe of liver lesion appears slightly increased in size when compared with the previous exam. 2. No new liver lesions identified. 3. Stable appearance of the colon status post right hemicolectomy. 4.  Aortic Atherosclerosis (ICD10-I70.0). Electronically Signed   By: TKerby MoorsM.D.   On: 09/19/2017 15:28    PROCEDURES   COLONOSCOPY 08/31/2016 DR. JACOBS  - 1) Polypoid mass at the hepatic flexure, 2cm, soft but clearly neoplastic. This was biopsied and the site was injected with Spot. - 2) Clearly malignant mass in the distal transverse colon; 4cm, firm, 1/2 cirumference, ulcerated. This was biopsied and the site was also injected with Spot. - 3) Four small (3-489m scattered typical appearing adenomas throughout the colon, not removed since she mistakenly stayed on her plavix for this procedure.  ASSESSMENT & PLAN:   ViSharice Harrissis a [age].o. caucasian female with colorectal adenocarcinoma, moderately differentiated, found on screening colonoscopy.   1. Synchronized colon adenocarcinoma, moderately differentiated, in hepatic fracture and distal transverse colon, pT3(m)N1cMx with indeterminate liver lesions, at least stage IIIB, MSI-H -I previously reviewed her colonoscopy, biopsy and CT and MRI image findings with patient in great details. -She has to synchronized colon mass in the hepatic flexure and distal transverse colon, post biopsy  showed moderately-differentiated adenocarcinoma -  CT and abdominal MRI showed 2 lesions in right lobe of liver, at least one lesion is very suspicious for metastatic disease, and enlarged periportal lymph nodes.  -We previously discussed 10/04/16 PET. There is no definitive distant metastasis on the PET scan. There was no uptake in her liver, liver metastasis is less likely, although not ruled out. There is a hypermetabolic lymph node in the portacaval region measuring 1.5 cm with SUV 16.7, compatible with malignancy. -12/28/16 MRI of abdomen revealed no significant change in liver lesions, but noted slightly enlarged periportal lymphadenopathy from previous scan, suspicious for nodal metastasis. -I previously recommend EUS biopsy of the portacaval lymph node, which was negative  -She completed 7 cycles of chemotherapy FOLFOX 12/29/17-04/11/17, it became more difficult for her continue treatment, due to side effects and she previously requested to stop IV chemo because of financial burden and because she wants to get back to her life and her horses.  -Since metastatic disease is not ruled out, I recommend her to continue chemotherapy a little longer, for at least a total of 6 months. FOLFOX was switched to Xeloda on 04/26/17. -Although she is tolerated Xeloda well overall, she had mild side effects, and one episode of vomiting, she is very reluctant to continue chemo for much longer and stopped Xeloda on 06/12/17.  -Down the road, if she has further disease progression, or intolerance to chemo, we can consider immunotherapy with Keytruda due to her MSI high disease. -CT CAP W Contrast from 06/13/17 revealed continued regression of hepatic lesions, these are concerning for metastatic disease.  -We discussed her CT AP from 09/19/17 which shows her liver lesions have increased slightly since previous scan, otherwise her scan is stable with no new lesions.  -Follow up MRI from 10/01/17 showed stable liver lesions,  metastasis is not ruled out. I discussed with IR Dr. Bethel Born who feels her liver lesions are difficult to biopsy, and overall stable. He recommends f/u with MRI in 6 months which I agree.  I discussed with pt and she agrees with the plan  -She has not had her port accessed in over a month. She will get her port flushed today and with regular flushes every 6 weeks.  -She plans to have colonoscopy in late 09/2017 by [name]  -F/u in 3 months   2. 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 I previously recommend she sees a Dietitian to rule out lynch syndrome. She agreed to see genetics  -Had genetics apt on 1/2, she consented to genetics testing -genetic testing were negative for Lynch Syndrome   3. Liver lesions  -Her initial staging CT scan showed numerous small liver lesions, indeterminate, those are not hypermetabolic on PET scan. -Previously biopsy of the enlarged portal node was negative for metastasis, but also appears to have shrunk while on chemotherapy -CT AP  from 06/13/17 revealed slightly decreased in size of the liver lesions  -CT AP from 09/19/17 shows liver lesions slightly increased in size since last scan. Follow up MRI from 10/01/17 showed liver lesions did not significantly change since previous scans. Will monitor with repeat scans  4. History of stroke  -No residual neuro deficits. She functions well at home. -she is on aspirin and Plavix, will continue.  5. 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   All questions were answered. The patient knows to call the clinic with any problems, questions or concerns.  I spent 20 minutes counseling the patient face to face. The total time spent in the appointment was 25 minutes and more than 50% was on counseling.  IOneal Deputy am acting as scribe for YTruitt [name] MD.   I have reviewed the above documentation for accuracy and completeness, and I agree with the above.      YTruitt [name] MD 10/04/2017

## 2017-10-04 ENCOUNTER — Encounter: Payer: Self-pay | Admitting: Gastroenterology

## 2017-10-04 ENCOUNTER — Inpatient Hospital Stay: Payer: Medicare HMO | Attending: Nurse Practitioner | Admitting: Hematology

## 2017-10-04 ENCOUNTER — Encounter: Payer: Self-pay | Admitting: Hematology

## 2017-10-04 VITALS — BP 131/55 | HR 68 | Temp 97.8°F | Resp 14 | Ht 64.0 in | Wt 150.8 lb

## 2017-10-04 DIAGNOSIS — I10 Essential (primary) hypertension: Secondary | ICD-10-CM | POA: Insufficient documentation

## 2017-10-04 DIAGNOSIS — I4891 Unspecified atrial fibrillation: Secondary | ICD-10-CM | POA: Insufficient documentation

## 2017-10-04 DIAGNOSIS — C188 Malignant neoplasm of overlapping sites of colon: Secondary | ICD-10-CM | POA: Diagnosis not present

## 2017-10-04 DIAGNOSIS — Z7901 Long term (current) use of anticoagulants: Secondary | ICD-10-CM | POA: Diagnosis not present

## 2017-10-04 DIAGNOSIS — C787 Secondary malignant neoplasm of liver and intrahepatic bile duct: Secondary | ICD-10-CM | POA: Diagnosis not present

## 2017-10-04 DIAGNOSIS — Z8673 Personal history of transient ischemic attack (TIA), and cerebral infarction without residual deficits: Secondary | ICD-10-CM | POA: Diagnosis not present

## 2017-10-04 DIAGNOSIS — C189 Malignant neoplasm of colon, unspecified: Secondary | ICD-10-CM

## 2017-10-04 MED ORDER — HEPARIN SOD (PORK) LOCK FLUSH 100 UNIT/ML IV SOLN
500.0000 [IU] | Freq: Once | INTRAVENOUS | Status: DC
  Filled 2017-10-04: qty 5

## 2017-10-04 MED ORDER — ALTEPLASE 2 MG IJ SOLR
INTRAMUSCULAR | Status: AC
  Filled 2017-10-04: qty 2

## 2017-10-04 MED ORDER — ALTEPLASE 2 MG IJ SOLR
2.0000 mg | Freq: Once | INTRAMUSCULAR | Status: AC | PRN
  Administered 2017-10-04: 2 mg
  Filled 2017-10-04: qty 2

## 2017-10-04 MED ORDER — SODIUM CHLORIDE 0.9% FLUSH
10.0000 mL | INTRAVENOUS | Status: DC | PRN
  Filled 2017-10-04: qty 10

## 2017-10-05 ENCOUNTER — Telehealth: Payer: Self-pay | Admitting: Hematology

## 2017-10-05 NOTE — Telephone Encounter (Signed)
Appts scheduled Letter/Calenda mail and I spoke with her also. per 8/14 los

## 2017-10-17 ENCOUNTER — Telehealth: Payer: Self-pay | Admitting: Gastroenterology

## 2017-10-17 NOTE — Telephone Encounter (Signed)
Informed pt. That she was to hold Eliquis from 10-16-17 through 10-18-17 ,that she was okay to have procedure,reinforced to pt. That she is not to take her blood thinner today and stated" I have  not taken Eliquis since Friday."

## 2017-10-18 ENCOUNTER — Encounter: Payer: Medicare HMO | Admitting: Gastroenterology

## 2017-10-18 ENCOUNTER — Encounter: Payer: Self-pay | Admitting: Gastroenterology

## 2017-10-18 ENCOUNTER — Ambulatory Visit (AMBULATORY_SURGERY_CENTER): Payer: Medicare HMO | Admitting: Gastroenterology

## 2017-10-18 VITALS — BP 151/72 | HR 60 | Temp 98.4°F | Resp 15 | Ht 64.0 in | Wt 150.0 lb

## 2017-10-18 DIAGNOSIS — Z85038 Personal history of other malignant neoplasm of large intestine: Secondary | ICD-10-CM | POA: Diagnosis not present

## 2017-10-18 DIAGNOSIS — Z1211 Encounter for screening for malignant neoplasm of colon: Secondary | ICD-10-CM | POA: Diagnosis not present

## 2017-10-18 MED ORDER — SODIUM CHLORIDE 0.9 % IV SOLN: 500.0000 mL | Freq: Once | INTRAVENOUS | Status: DC

## 2017-10-18 NOTE — Op Note (Signed)
[place] Patient Name: [place] Procedure Date: 10/18/2017 11:28 AM MRN: [id number] Endoscopist: [name] , MD Age: 77 Referring MD:  Date of Birth: 1941/01/07 Gender: Female Account #: [id number] Procedure:                Colonoscopy Indications:              Synchronized colon adenocarcinoma, moderately                            differentiated, in hepatic fracture and distal                            transverse colon, pT3(m)N1cMx with indeterminate                            liver lesions, at least stage IIIB, MSI-H; s/p                            extended right hemicolectomy 10/2016; lesions in                            liver that are equivocal for mets; on adjuvant chemo Medicines:                Monitored Anesthesia Care Procedure:                Pre-Anesthesia Assessment:                           - Prior to the procedure, a History and Physical                            was performed, and patient medications and                            allergies were reviewed. The patient's tolerance of                            previous anesthesia was also reviewed. The risks                            and benefits of the procedure and the sedation                            options and risks were discussed with the patient.                            All questions were answered, and informed consent                            was obtained. Prior Anticoagulants: The patient has                            taken Eliquis (apixaban), last dose was 3 days  prior to procedure. ASA Grade Assessment: III - A                            patient with severe systemic disease. After                            reviewing the risks and benefits, the patient was                            deemed in satisfactory condition to undergo the                            procedure.                           After obtaining informed consent, the colonoscope                             was passed under direct vision. Throughout the                            procedure, the patient's blood pressure, pulse, and                            oxygen saturations were monitored continuously. The                            Colonoscope was introduced through the anus and                            advanced to the the ileocolonic anastomosis. The                            colonoscopy was performed without difficulty. The                            patient tolerated the procedure well. The quality                            of the bowel preparation was good. The ileocecal                            valve, appendiceal orifice, and rectum were                            photographed. Scope In: 11:34:38 AM Scope Out: 11:43:17 AM Scope Withdrawal Time: 0 hours 3 minutes 17 seconds  Total Procedure Duration: 0 hours 8 minutes 39 seconds  Findings:                 Normal appearing right sided ileocolonic                            anastomosis.  1.5cm long superficial tear in the colon mucosa                            noted during scope withdrawal (descending segment,                            see images). Clearly not perforated.                           The exam was otherwise without abnormality on                            direct and retroflexion views. Complications:            No immediate complications. Estimated blood loss:                            None. Estimated Blood Loss:     Estimated blood loss: none. Impression:               - Normal appearing right sided ileocolonic                            anastomosis.                           - 1.5cm long superficial tear in the colon mucosa                            noted during scope withdrawal (descending segment,                            see images).                           - No polyps or cancers. Recommendation:           - Patient has a contact number available for                             emergencies. The signs and symptoms of potential                            delayed complications were discussed with the                            patient. Return to normal activities tomorrow.                            Written discharge instructions were provided to the                            patient.                           - Resume previous diet.                           -  Continue present medications.                           - Repeat colonoscopy in 3 years for surveillance. [name], MD 10/18/2017 11:54:32 AM This report has been signed electronically.

## 2017-10-18 NOTE — Progress Notes (Signed)
A/ox3 pleased with MAC, report to RN 

## 2017-10-18 NOTE — Progress Notes (Signed)
Pt's states no medical or surgical changes since previsit or office visit. 

## 2017-10-18 NOTE — Patient Instructions (Signed)
   Restart Eliquis tomorrow (10/19/17)    YOU HAD AN ENDOSCOPIC PROCEDURE TODAY AT [facility]:   Refer to the procedure report that was given to you for any specific questions about what was found during the examination.  If the procedure report does not answer your questions, please call your gastroenterologist to clarify.  If you requested that your care partner not be given the details of your procedure findings, then the procedure report has been included in a sealed envelope for you to review at your convenience later.  YOU SHOULD EXPECT: Some feelings of bloating in the abdomen. Passage of more gas than usual.  Walking can help get rid of the air that was put into your GI tract during the procedure and reduce the bloating. If you had a lower endoscopy (such as a colonoscopy or flexible sigmoidoscopy) you may notice spotting of blood in your stool or on the toilet paper. If you underwent a bowel prep for your procedure, you may not have a normal bowel movement for a few days.  Please Note:  You might notice some irritation and congestion in your nose or some drainage.  This is from the oxygen used during your procedure.  There is no need for concern and it should clear up in a day or so.  SYMPTOMS TO REPORT IMMEDIATELY:   Following lower endoscopy (colonoscopy or flexible sigmoidoscopy):  Excessive amounts of blood in the stool  Significant tenderness or worsening of abdominal pains  Swelling of the abdomen that is new, acute  Fever of 100F or higher    For urgent or emergent issues, a gastroenterologist can be reached at any hour by calling [phone].   DIET:  We do recommend a small meal at first, but then you may proceed to your regular diet.  Drink plenty of fluids but you should avoid alcoholic beverages for 24 hours.  ACTIVITY:  You should plan to take it easy for the rest of today and you should NOT DRIVE or use heavy machinery until tomorrow (because of  the sedation medicines used during the test).    FOLLOW UP: Our staff will call the number listed on your records the next business day following your procedure to check on you and address any questions or concerns that you may have regarding the information given to you following your procedure. If we do not reach you, we will leave a message.  However, if you are feeling well and you are not experiencing any problems, there is no need to return our call.  We will assume that you have returned to your regular daily activities without incident.  If any biopsies were taken you will be contacted by phone or by letter within the next 1-3 weeks.  Please call us at [phone] if you have not heard about the biopsies in 3 weeks.    SIGNATURES/CONFIDENTIALITY: You and/or your care partner have signed paperwork which will be entered into your electronic medical record.  These signatures attest to the fact that that the information above on your After Visit Summary has been reviewed and is understood.  Full responsibility of the confidentiality of this discharge information lies with you and/or your care-partner.

## 2017-10-19 ENCOUNTER — Telehealth: Payer: Self-pay

## 2017-10-19 ENCOUNTER — Telehealth: Payer: Self-pay | Admitting: *Deleted

## 2017-10-19 NOTE — Telephone Encounter (Signed)
Second follow up phone call attempt, no answer, message left 

## 2017-10-19 NOTE — Telephone Encounter (Signed)
No answer, left message to call if questions or conerns

## 2017-10-28 ENCOUNTER — Encounter: Payer: Self-pay | Admitting: Family Medicine

## 2017-10-28 ENCOUNTER — Ambulatory Visit (INDEPENDENT_AMBULATORY_CARE_PROVIDER_SITE_OTHER): Payer: Medicare HMO | Admitting: Family Medicine

## 2017-10-28 VITALS — BP 126/68 | HR 78 | Temp 98.2°F | Ht 64.0 in | Wt 151.5 lb

## 2017-10-28 DIAGNOSIS — W19XXXA Unspecified fall, initial encounter: Secondary | ICD-10-CM

## 2017-10-28 DIAGNOSIS — F5101 Primary insomnia: Secondary | ICD-10-CM

## 2017-10-28 DIAGNOSIS — S8002XA Contusion of left knee, initial encounter: Secondary | ICD-10-CM

## 2017-10-28 DIAGNOSIS — R69 Illness, unspecified: Secondary | ICD-10-CM | POA: Diagnosis not present

## 2017-10-28 NOTE — Patient Instructions (Addendum)
Avoid long shoe laces and be careful not to fall   Let us know if you develop any headache or dizziness - (from bumping your head)  Bruises look to be improving  You have a large hematoma (bruise/bleeding under the skin) of knee and leg  Also possibly swollen bursa on top of knee from the injury   Regular activity is fine if it does not cause pain  When you rest-please elevate and ice  This will take a while to get better If you suddenly get pain in the knee or leg - call us immediately   You cannot take ibuprofen or other anti inflammatory with eliquis  (advil or aleve)  Tylenol is fine   Benadryl 25-50 mg thirty minutes before bed may help sleep  You can also try melatonin over the counter as directed on the bottle  Another over the counter medicine called volarian may help as well   Wear ear plugs if noises keep you up  An eye mask also helps

## 2017-10-28 NOTE — Assessment & Plan Note (Signed)
[place]idental [place] horse stable with injury  She tripped over long shoe laces Disc plan to wear shoes w/o laces and also use more caution (esp when shoveling)   Large hematoma on L leg Bumped head- bruising improved (no concussion symptoms)- urged her to call if she develops HA/dizziness or other symptoms

## 2017-10-28 NOTE — Progress Notes (Signed)
Subjective:    Patient ID: [name], female    DOB: 12/30/1940, 77 y.o.   MRN: [id number]  HPI Here for left leg pain after a fall on Saturday  Tripped and fell into the corner of a horse stall   On L knee and bumped her head on both sides   Her L leg became very bruised -knee and below  She iced it for 2 nights and some elevation   Not very painful - does feel tight  Does not have deep or internal pain in the knee   No headaches     Wt Readings from Last 3 Encounters:  10/28/17 151 lb 8 oz (68.7 kg)  10/18/17 150 lb (68 kg)  10/04/17 150 lb 12.8 oz (68.4 kg)   26.00 kg/m    She takes eliquis for past DVT  (diagnosed during cancer tx for colon cancer)   Having trouble with insomnia-tried 1/4 of friends halcion  Took advil pm   Patient Active Problem List   Diagnosis Date Noted  . Traumatic hematoma of knee, left, initial encounter 10/28/2017  . Fall 10/28/2017  . Abnormality of breast on screening mammography 08/01/2017  . Genetic testing 04/25/2017  . Adenopathy   . Acute deep vein thrombosis (DVT) of proximal vein of left lower extremity ([place]) 11/24/2016  . Cancer of overlapping sites of colon ([place]) 09/23/2016  . Estrogen deficiency 07/30/2016  . Screening mammogram, encounter for 07/30/2016  . Routine general medical examination at a health care facility 07/27/2016  . H/O: CVA (cerebrovascular accident) 05/06/2015  . HTN (hypertension) 05/06/2015  . HLD (hyperlipidemia) 05/06/2015  . GERD (gastroesophageal reflux disease) 05/06/2015  . CAD (coronary artery disease) 05/06/2015  . Seborrheic keratosis 07/05/2012  . 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 Callus of foot 10/16/2010  . Coronary artery disease, non-occlusive 10/16/2010  . Insomnia 02/02/2010  . HAND PAIN, BILATERAL 01/09/2010  . HYPERGLYCEMIA 07/29/2009  . ALLERGIC RHINITIS 12/04/2008  . Vitamin D deficiency 04/30/2008  . COLONIC POLYPS 06/28/2007  . IBS 03/17/2007  . INTERSTITIAL  CYSTITIS 03/17/2007  . Osteoporosis 03/17/2007   Past Medical History:  Diagnosis Date  . Anemia   . Arthritis   . Astigmatism    both eyes  . Cancer [facility])    colon surgery done last chemo tx 01-17-17  . Cataract   . Colon cancer ([place])   . Coronary artery disease, non-occlusive   . Dyspnea    more so exertion  . Dysrhythmia    skipped beats every now and then  . GERD (gastroesophageal reflux disease)   . Glaucoma    both eyes right eye worse than left  . Hammer toes of both feet   . Headache    migraines with aura  . Heel spur    both heels and front  . HSV (herpes simplex virus) infection   . Hyperglycemia    denies  . Hyperlipidemia   . IBS (irritable bowel syndrome)   . IC (interstitial cystitis)   . Insomnia disorder related to known organic factor   . Osteoporosis   . PONV (postoperative nausea and vomiting)    has had more surgeries, and had no problems  . Stroke ([place]) 2016  . Vitamin D deficiency    Past Surgical History:  Procedure Laterality Date  . ABDOMINAL HYSTERECTOMY     1 ovary and womb removed  . CARDIAC CATHETERIZATION  7/12   non obst dz -- not sure what yr  . CHOLECYSTECTOMY    .  EUS N/A 02/03/2017   Procedure: UPPER ENDOSCOPIC ULTRASOUND (EUS) LINEAR;  Surgeon: [name], MD;  Location: [facility];  Service: Endoscopy;  Laterality: N/A;  . EYE SURGERY Bilateral    ioc for cataracts   . LAPAROSCOPIC RIGHT COLECTOMY Right 11/10/2016   Procedure: LAPAROSCOPIC EXTENDED RIGHT COLECTOMY;  Surgeon: [name], MD;  Location: [place];  Service: General;  Laterality: Right;  . PORTACATH PLACEMENT Left 12/22/2016   Procedure: INSERTION PORT-A-CATH;  Surgeon: [name], MD;  Location: [place];  Service: General;  Laterality: Left;  . ROTATOR CUFF REPAIR Bilateral    Social History   Tobacco Use  . Smoking status: Never Smoker  . Smokeless tobacco: Never Used  Substance Use Topics  . Alcohol use: No    Alcohol/week:  0.0 standard drinks  . Drug use: No   Family History  Problem Relation Age of Onset  . Alcohol abuse Mother   . Heart attack Mother   . GER disease Mother   . GER disease Father   . Alcohol abuse Father   . Stroke Father   . Tuberculosis Father   . Heart attack Father   . Aneurysm Father   . Mental illness Sister   . Depression Sister   . Breast cancer Neg Hx    Allergies  Allergen Reactions  . Salmon [Fish Allergy] Anaphylaxis    This happens with "CANNED SALMON" only, if eaten uncooked  . Ampicillin Nausea Only    Has patient had a PCN reaction causing immediate rash, facial/tongue/throat swelling, SOB or lightheadedness with hypotension: No Has patient had a PCN reaction causing severe rash involving mucus membranes or skin necrosis: No Has patient had a PCN reaction that required hospitalization: No Has patient had a PCN reaction occurring within the last 10 years: No If all of the above answers are "NO", then may proceed with Cephalosporin use.   . Codeine Nausea And Vomiting  . Flagyl [Metronidazole] Nausea And Vomiting    Patient was taking this along with Neomycin and could not differentiate which med triggered the nausea & vomiting  . Meperidine Hcl Nausea And Vomiting  . Neomycin Nausea And Vomiting    Patient was taking this along with Flagyl and could not differentiate which med triggered the nausea & vomiting  . Penicillins Other (See Comments)    Has patient had a PCN reaction causing immediate rash, facial/tongue/throat swelling, SOB or lightheadedness with hypotension: Unknown Has patient had a PCN reaction causing severe rash involving mucus membranes or skin necrosis: No Has patient had a PCN reaction that required hospitalization: No Has patient had a PCN reaction occurring within the last 10 years: No If all of the above answers are "NO", then may proceed with Cephalosporin use.   Marland Kitchen Propoxyphene Hcl Nausea And Vomiting  . Sulfa Antibiotics Hives  .  Tramadol Hcl Nausea And Vomiting   Current Outpatient Medications on File Prior to Visit  Medication Sig Dispense Refill  . atorvastatin (LIPITOR) 40 MG tablet Take 1 tablet (40 mg total) by mouth daily at 6 PM. 90 tablet 3  . b complex vitamins tablet Take 1 tablet by mouth daily.    . Cholecalciferol (VITAMIN D) 2000 units tablet Take 2,000 Units by mouth 3 (three) times a week.     Marland Kitchen ELIQUIS 5 MG TABS tablet TAKE 1 TABLET BY MOUTH TWICE A DAY 60 tablet 2  . gabapentin (NEURONTIN) 100 MG capsule TAKE 1 CAPSULE (100 MG TOTAL) BY MOUTH  2 (TWO) TIMES DAILY AS NEEDED. 30 capsule 2  . lactose free nutrition (BOOST) LIQD Take 237 mLs by mouth daily as needed.     . lidocaine-prilocaine (EMLA) cream Apply to affected area once (Patient taking differently: Apply to affected area once (near port)) 30 g 3  . Multiple Vitamin (MULTIVITAMIN WITH MINERALS) TABS tablet Take 1 tablet by mouth daily.    . potassium chloride SA (K-DUR,KLOR-CON) 20 MEQ tablet Take 1 tablet (20 mEq total) by mouth daily. 90 tablet 3   Current Facility-Administered Medications on File Prior to Visit  Medication Dose Route Frequency Provider Last Rate Last Dose  . 0.9 %  sodium chloride infusion  500 mL Intravenous Once [name], MD         Review of Systems  Constitutional: Negative for activity change, appetite change, fatigue, fever and unexpected weight change.  HENT: Negative for congestion, ear pain, rhinorrhea, sinus pressure and sore throat.   Eyes: Negative for pain, redness and visual disturbance.  Respiratory: Negative for cough, shortness of breath and wheezing.   Cardiovascular: Negative for chest pain and palpitations.  Gastrointestinal: Negative for abdominal pain, blood in stool, constipation and diarrhea.  Endocrine: Negative for polydipsia and polyuria.  Genitourinary: Negative for dysuria, frequency and urgency.  Musculoskeletal: Positive for joint swelling. Negative for arthralgias, back pain and  myalgias.       Swollen L knee with bruising of knee and leg  Skin: Negative for pallor and rash.  Allergic/Immunologic: Negative for environmental allergies.  Neurological: Negative for dizziness, syncope and headaches.  Hematological: Negative for adenopathy. Does not bruise/bleed easily.  Psychiatric/Behavioral: Positive for sleep disturbance. Negative for decreased concentration and dysphoric mood. The patient is not nervous/anxious.        Objective:   Physical Exam  Constitutional: She is oriented to person, place, and time. She appears well-developed and well-nourished. No distress.  HENT:  Head: Normocephalic.  Right Ear: External ear normal.  Left Ear: External ear normal.  Mild scattered bruises on both sides of forehead-now yellow  Not tender  No bony step off noted   Eyes: Pupils are equal, round, and reactive to light. Conjunctivae and EOM are normal. No scleral icterus.  Neck: Normal range of motion. Neck supple.  Cardiovascular: Normal rate, regular rhythm and normal heart sounds.  Pulmonary/Chest: Effort normal and breath sounds normal. No respiratory distress. She has no wheezes.  Musculoskeletal: She exhibits edema. She exhibits no deformity.  Diffuse bruising of L knee /lower leg -now starting to turn yellow (ant and post)  Much swelling pre patellar (not tight or tender)- suspect hematoma plus or minus swelling of bursa  Little to no tenderness Perfusion and sens intact  Nl rom of knee ( per pt feels tight to flex past (90 deg)   Nl rom ankle/foot and nl gait  No palp cords Neg homans sign  Lymphadenopathy:    She has no cervical adenopathy.  Neurological: She is alert and oriented to person, place, and time. She displays normal reflexes. No cranial nerve deficit. She exhibits normal muscle tone. Coordination normal.  Skin: Skin is warm and dry.  Ecchymosis LLE incl knee  Psychiatric: She has a normal mood and affect.          Assessment & Plan:    Problem List Items Addressed This Visit      Other   Fall    Accidental fall in horse stable with injury  She tripped over long shoe laces Disc  plan to wear shoes w/o laces and also use more caution (esp when shoveling)   Large hematoma on L leg Bumped head- bruising improved (no concussion symptoms)- urged her to call if she develops HA/dizziness or other symptoms      Insomnia    Rev safe alt to tx insomnia Age may play a role  Handout given for sleep hygieine  Ear plugs/eye mask prn Melatonin/benadryl or volarian may be tried with caution I do not recommend benzidiazapine      Traumatic hematoma of knee, left, initial encounter - Primary    Large hematoma L knee and lower leg after a fall in a stable (striking wood and the ground)  Bruises are turning yellow  Patellar bursa may also be swollen  Pt c/o tightness but not pain (circ intact) and good rom of knee  Adv not to take nsaids with eliquis - she voiced understanding  Elevate/ice knee and leg when able  Expectant management- inst to alert [place] if pain develops or worse swelling or if no imp in 1-2 wk

## 2017-10-28 NOTE — Assessment & Plan Note (Signed)
Large hematoma L knee and lower leg after a fall in a stable (striking wood and the ground)  Bruises are turning yellow  Patellar bursa may also be swollen  Pt c/o tightness but not pain (circ intact) and good rom of knee  Adv not to take nsaids with eliquis - she voiced understanding  Elevate/ice knee and leg when able  Expectant management- inst to alert [place] if pain develops or worse swelling or if no imp in 1-2 wk

## 2017-10-28 NOTE — Assessment & Plan Note (Signed)
Rev safe alt to tx insomnia Age may play a role  Handout given for sleep hygieine  Ear plugs/eye mask prn Melatonin/benadryl or volarian may be tried with caution I do not recommend benzidiazapine

## 2017-10-31 ENCOUNTER — Telehealth: Payer: Self-pay | Admitting: Gastroenterology

## 2017-10-31 NOTE — Telephone Encounter (Signed)
The pt states she has lower back pain and occasional nausea. She had a tear seen on colon 8/27 and is concerned that the symptoms are a result of that tear.  She states she fell a few days ago and hit her head and face and felt something pop in her back.  She was seen by PCP for this incident but I advised her to call back for eval or go to the ED for eval if the nausea worsens or she develops any further symptoms.  She has no abd pain, rectal bleeding or mucous and no change in her bowel habits.  No fever.  She will call PCP now for eval.  Forwarded to Dr [name] as an Juluis Rainier

## 2017-10-31 NOTE — Telephone Encounter (Signed)
Pt stated that she has been experiencing lower back pain and nausea since this morning, she is not sure if it has to do with the scratch in her colon that occurred during procedure. Pls call her.

## 2017-11-07 DIAGNOSIS — H401133 Primary open-angle glaucoma, bilateral, severe stage: Secondary | ICD-10-CM | POA: Diagnosis not present

## 2017-11-07 DIAGNOSIS — H43393 Other vitreous opacities, bilateral: Secondary | ICD-10-CM | POA: Diagnosis not present

## 2017-11-07 DIAGNOSIS — H1859 Other hereditary corneal dystrophies: Secondary | ICD-10-CM | POA: Diagnosis not present

## 2017-11-07 DIAGNOSIS — Z961 Presence of intraocular lens: Secondary | ICD-10-CM | POA: Diagnosis not present

## 2017-11-11 DIAGNOSIS — H10413 Chronic giant papillary conjunctivitis, bilateral: Secondary | ICD-10-CM | POA: Diagnosis not present

## 2017-11-16 ENCOUNTER — Inpatient Hospital Stay: Payer: Medicare HMO | Attending: Nurse Practitioner

## 2017-11-16 DIAGNOSIS — Z452 Encounter for adjustment and management of vascular access device: Secondary | ICD-10-CM | POA: Diagnosis present

## 2017-11-16 DIAGNOSIS — Z961 Presence of intraocular lens: Secondary | ICD-10-CM | POA: Diagnosis not present

## 2017-11-16 DIAGNOSIS — H10413 Chronic giant papillary conjunctivitis, bilateral: Secondary | ICD-10-CM | POA: Diagnosis not present

## 2017-11-16 DIAGNOSIS — C188 Malignant neoplasm of overlapping sites of colon: Secondary | ICD-10-CM

## 2017-11-16 DIAGNOSIS — H401133 Primary open-angle glaucoma, bilateral, severe stage: Secondary | ICD-10-CM | POA: Diagnosis not present

## 2017-11-16 DIAGNOSIS — H43393 Other vitreous opacities, bilateral: Secondary | ICD-10-CM | POA: Diagnosis not present

## 2017-11-16 MED ORDER — SODIUM CHLORIDE 0.9% FLUSH
10.0000 mL | Freq: Once | INTRAVENOUS | Status: AC
  Administered 2017-11-16: 10 mL
  Filled 2017-11-16: qty 10

## 2017-11-16 MED ORDER — HEPARIN SOD (PORK) LOCK FLUSH 100 UNIT/ML IV SOLN
500.0000 [IU] | Freq: Once | INTRAVENOUS | Status: AC
  Administered 2017-11-16: 500 [IU]
  Filled 2017-11-16: qty 5

## 2017-11-17 ENCOUNTER — Encounter: Payer: Self-pay | Admitting: Podiatry

## 2017-11-17 ENCOUNTER — Other Ambulatory Visit: Payer: Self-pay | Admitting: Podiatry

## 2017-11-17 ENCOUNTER — Ambulatory Visit: Payer: Medicare HMO | Admitting: Podiatry

## 2017-11-17 ENCOUNTER — Ambulatory Visit (INDEPENDENT_AMBULATORY_CARE_PROVIDER_SITE_OTHER): Payer: Medicare HMO

## 2017-11-17 VITALS — BP 117/64 | HR 76 | Resp 16

## 2017-11-17 DIAGNOSIS — M7672 Peroneal tendinitis, left leg: Secondary | ICD-10-CM

## 2017-11-17 DIAGNOSIS — G629 Polyneuropathy, unspecified: Secondary | ICD-10-CM | POA: Diagnosis not present

## 2017-11-17 DIAGNOSIS — D689 Coagulation defect, unspecified: Secondary | ICD-10-CM

## 2017-11-17 DIAGNOSIS — S99922A Unspecified injury of left foot, initial encounter: Secondary | ICD-10-CM

## 2017-11-17 DIAGNOSIS — L84 Corns and callosities: Secondary | ICD-10-CM | POA: Diagnosis not present

## 2017-11-17 DIAGNOSIS — R2681 Unsteadiness on feet: Secondary | ICD-10-CM | POA: Diagnosis not present

## 2017-11-17 MED ORDER — TRIAMCINOLONE ACETONIDE 10 MG/ML IJ SUSP
10.0000 mg | Freq: Once | INTRAMUSCULAR | Status: AC
  Administered 2017-11-17: 10 mg

## 2017-11-17 NOTE — Progress Notes (Signed)
   Subjective:    Patient ID: [name], female    DOB: 06-29-1940, 77 y.o.   MRN: [id number]  HPI    Review of Systems  All other systems reviewed and are negative.      Objective:   Physical Exam        Assessment & Plan:

## 2017-11-17 NOTE — Progress Notes (Signed)
Subjective:   Patient ID: [name], female   DOB: 77 y.o.   MRN: [id number]   HPI Patient presents stating that she fell down 3 weeks ago and hurt her left foot and knee.  States she is very unstable with long-term history of neuropathy and has had chemo for cancer last year.  States the neuropathy is getting worse and she feels unsteady and she needs to take care of her animals and patient also had a DVT last September and is on a blood thinner.  Patient does not smoke and likes to be active   Review of Systems  All other systems reviewed and are negative.       Objective:  Physical Exam  Constitutional: She appears well-developed and well-nourished.  Cardiovascular: Intact distal pulses.  Pulmonary/Chest: Effort normal.  Musculoskeletal: Normal range of motion.  Neurological: She is alert.  Skin: Skin is warm.  Nursing note and vitals reviewed.   Vascular status found to be intact with patient having diminishment of sharp dull vibratory DTR reflexes.  Patient is noted to have inflammation and pain of the outside of the left foot around the peroneal insertion and has reduced stability and balance and walks with a slow tight gait pattern with having to hold onto walls in order to be stable but needs to be active due to her activity levels at home.  Patient has good digital perfusion and is well oriented x3     Assessment:  Patient with profound neuropathy tendinitis left also has a keratotic lesion subsecond metatarsal left with pain and does have risk factors associated with taking blood thinner.     Plan:  H&P x-rays reviewed and today careful tenderness injection administered left 3 mg Kenalog 5 mg Xylocaine peroneal insertion.  I debrided the lesion on the left and I discussed her long-term neuropathy and worsening of this condition with chemo and her instability and history of falling.  I have recommended balance bracing to try to help her to be more mobile and to  hopefully prevent falls in the future and I am referring her to ped orthotist to have balance braces fabricated  X-rays indicate osteoporosis left with severe displacement of the third MPJ which is more chronic in nature with pain upon palpation and no indication of fracture

## 2017-11-22 ENCOUNTER — Ambulatory Visit: Payer: Medicare HMO | Admitting: Orthotics

## 2017-11-22 DIAGNOSIS — L84 Corns and callosities: Secondary | ICD-10-CM

## 2017-11-22 DIAGNOSIS — D689 Coagulation defect, unspecified: Secondary | ICD-10-CM

## 2017-11-22 DIAGNOSIS — M7672 Peroneal tendinitis, left leg: Secondary | ICD-10-CM

## 2017-11-22 DIAGNOSIS — R2681 Unsteadiness on feet: Secondary | ICD-10-CM

## 2017-11-22 DIAGNOSIS — S99922A Unspecified injury of left foot, initial encounter: Secondary | ICD-10-CM

## 2017-11-22 NOTE — Progress Notes (Signed)
Patietn evaluated today for b/l balance braces.  Patient has hx of falling; most recent fall was about 3 weeks ago and she injured herself (head trauma).  She was given the TUG test and was very deliberate in her gait, all the while shuffling and making sure she stayed in balance:  18 seconds it took her to walk 10 feet return to chair.   She also reports neuropathy b/l brought on by Chemo treatments; metatarsalgia also affects her gait.  Patient reports flunctioning BP.   Patient cast for b/l balance brace.

## 2017-12-06 ENCOUNTER — Telehealth: Payer: Self-pay | Admitting: Podiatry

## 2017-12-06 NOTE — Telephone Encounter (Signed)
Left message for pt to call to discuss insurance coverage for the braces that we cast her for.

## 2017-12-13 ENCOUNTER — Telehealth: Payer: Self-pay | Admitting: Podiatry

## 2017-12-13 NOTE — Telephone Encounter (Signed)
Called pt and we discussed the braces that Dr Paulla Dolly has ordered and that she would be responsible for 20% coinsurance (est $458). Pt wants to proceed and will set up payment plan and is aware she has 12 months to pay it off.(est of 38 a month)

## 2017-12-15 ENCOUNTER — Other Ambulatory Visit: Payer: Self-pay | Admitting: Nurse Practitioner

## 2018-01-10 DIAGNOSIS — Z86718 Personal history of other venous thrombosis and embolism: Secondary | ICD-10-CM | POA: Diagnosis not present

## 2018-01-10 DIAGNOSIS — Z823 Family history of stroke: Secondary | ICD-10-CM | POA: Diagnosis not present

## 2018-01-10 DIAGNOSIS — Z85038 Personal history of other malignant neoplasm of large intestine: Secondary | ICD-10-CM | POA: Diagnosis not present

## 2018-01-10 DIAGNOSIS — G629 Polyneuropathy, unspecified: Secondary | ICD-10-CM | POA: Diagnosis not present

## 2018-01-10 DIAGNOSIS — Z7901 Long term (current) use of anticoagulants: Secondary | ICD-10-CM | POA: Diagnosis not present

## 2018-01-10 DIAGNOSIS — Z8249 Family history of ischemic heart disease and other diseases of the circulatory system: Secondary | ICD-10-CM | POA: Diagnosis not present

## 2018-01-10 DIAGNOSIS — Z7722 Contact with and (suspected) exposure to environmental tobacco smoke (acute) (chronic): Secondary | ICD-10-CM | POA: Diagnosis not present

## 2018-01-10 DIAGNOSIS — K5900 Constipation, unspecified: Secondary | ICD-10-CM | POA: Diagnosis not present

## 2018-01-10 DIAGNOSIS — E785 Hyperlipidemia, unspecified: Secondary | ICD-10-CM | POA: Diagnosis not present

## 2018-01-10 DIAGNOSIS — R32 Unspecified urinary incontinence: Secondary | ICD-10-CM | POA: Diagnosis not present

## 2018-01-14 DIAGNOSIS — R69 Illness, unspecified: Secondary | ICD-10-CM | POA: Diagnosis not present

## 2018-01-16 ENCOUNTER — Inpatient Hospital Stay: Payer: Medicare HMO | Attending: Nurse Practitioner

## 2018-01-16 ENCOUNTER — Inpatient Hospital Stay: Payer: Medicare HMO | Admitting: Hematology

## 2018-01-16 ENCOUNTER — Inpatient Hospital Stay: Payer: Medicare HMO

## 2018-01-17 ENCOUNTER — Telehealth: Payer: Self-pay | Admitting: Hematology

## 2018-01-17 NOTE — Telephone Encounter (Signed)
R/s appt per 11/26 sch message - pt is aware of appt date and time

## 2018-01-23 ENCOUNTER — Ambulatory Visit (INDEPENDENT_AMBULATORY_CARE_PROVIDER_SITE_OTHER): Payer: Medicare HMO | Admitting: Orthotics

## 2018-01-23 DIAGNOSIS — L84 Corns and callosities: Secondary | ICD-10-CM

## 2018-01-23 DIAGNOSIS — S99922A Unspecified injury of left foot, initial encounter: Secondary | ICD-10-CM | POA: Diagnosis not present

## 2018-01-23 DIAGNOSIS — R2681 Unsteadiness on feet: Secondary | ICD-10-CM

## 2018-01-23 DIAGNOSIS — M7672 Peroneal tendinitis, left leg: Secondary | ICD-10-CM | POA: Diagnosis not present

## 2018-01-23 NOTE — Progress Notes (Signed)
Patient came in today to pick up Moore Balance Brace (Bilateral).   Patient was able to don brace independently and brace was check for fit to custom.   Patient was observed walking with brace and gait was improved as well as stability.   Patient was advised of care and wearing instructions.  Advised to notify practice if there were any issues; especially skin irritation.  

## 2018-01-31 ENCOUNTER — Telehealth: Payer: Self-pay | Admitting: Hematology

## 2018-01-31 ENCOUNTER — Inpatient Hospital Stay (HOSPITAL_BASED_OUTPATIENT_CLINIC_OR_DEPARTMENT_OTHER): Payer: Medicare HMO | Admitting: Hematology

## 2018-01-31 ENCOUNTER — Inpatient Hospital Stay: Payer: Medicare HMO

## 2018-01-31 ENCOUNTER — Encounter: Payer: Self-pay | Admitting: Hematology

## 2018-01-31 ENCOUNTER — Inpatient Hospital Stay: Payer: Medicare HMO | Attending: Nurse Practitioner

## 2018-01-31 VITALS — BP 142/68 | HR 71 | Temp 97.7°F | Resp 18 | Ht 64.0 in | Wt 153.2 lb

## 2018-01-31 DIAGNOSIS — Z86718 Personal history of other venous thrombosis and embolism: Secondary | ICD-10-CM

## 2018-01-31 DIAGNOSIS — Z7901 Long term (current) use of anticoagulants: Secondary | ICD-10-CM

## 2018-01-31 DIAGNOSIS — I4891 Unspecified atrial fibrillation: Secondary | ICD-10-CM | POA: Insufficient documentation

## 2018-01-31 DIAGNOSIS — C787 Secondary malignant neoplasm of liver and intrahepatic bile duct: Secondary | ICD-10-CM

## 2018-01-31 DIAGNOSIS — D500 Iron deficiency anemia secondary to blood loss (chronic): Secondary | ICD-10-CM

## 2018-01-31 DIAGNOSIS — C188 Malignant neoplasm of overlapping sites of colon: Secondary | ICD-10-CM | POA: Diagnosis not present

## 2018-01-31 DIAGNOSIS — M818 Other osteoporosis without current pathological fracture: Secondary | ICD-10-CM | POA: Diagnosis not present

## 2018-01-31 DIAGNOSIS — C189 Malignant neoplasm of colon, unspecified: Secondary | ICD-10-CM

## 2018-01-31 DIAGNOSIS — K769 Liver disease, unspecified: Secondary | ICD-10-CM

## 2018-01-31 DIAGNOSIS — I10 Essential (primary) hypertension: Secondary | ICD-10-CM | POA: Diagnosis not present

## 2018-01-31 DIAGNOSIS — I2510 Atherosclerotic heart disease of native coronary artery without angina pectoris: Secondary | ICD-10-CM | POA: Diagnosis not present

## 2018-01-31 DIAGNOSIS — Z8673 Personal history of transient ischemic attack (TIA), and cerebral infarction without residual deficits: Secondary | ICD-10-CM | POA: Diagnosis not present

## 2018-01-31 LAB — CBC WITH DIFFERENTIAL/PLATELET
Abs Immature Granulocytes: 0.01 10*3/uL (ref 0.00–0.07)
Basophils Absolute: 0 10*3/uL (ref 0.0–0.1)
Basophils Relative: 0 %
Eosinophils Absolute: 0.1 10*3/uL (ref 0.0–0.5)
Eosinophils Relative: 1 %
HCT: 39.6 % (ref 36.0–46.0)
Hemoglobin: 12.6 g/dL (ref 12.0–15.0)
Immature Granulocytes: 0 %
Lymphocytes Relative: 27 %
Lymphs Abs: 1.9 10*3/uL (ref 0.7–4.0)
MCH: 29.6 pg (ref 26.0–34.0)
MCHC: 31.8 g/dL (ref 30.0–36.0)
MCV: 93 fL (ref 80.0–100.0)
Monocytes Absolute: 0.6 10*3/uL (ref 0.1–1.0)
Monocytes Relative: 9 %
Neutro Abs: 4.5 10*3/uL (ref 1.7–7.7)
Neutrophils Relative %: 63 %
Platelets: 248 10*3/uL (ref 150–400)
RBC: 4.26 MIL/uL (ref 3.87–5.11)
RDW: 13.2 % (ref 11.5–15.5)
WBC: 7.1 10*3/uL (ref 4.0–10.5)
nRBC: 0 % (ref 0.0–0.2)

## 2018-01-31 LAB — IRON AND TIBC
Iron: 108 ug/dL (ref 41–142)
Saturation Ratios: 31 % (ref 21–57)
TIBC: 351 ug/dL (ref 236–444)
UIBC: 243 ug/dL (ref 120–384)

## 2018-01-31 LAB — COMPREHENSIVE METABOLIC PANEL
ALT: 20 U/L (ref 0–44)
AST: 20 U/L (ref 15–41)
Albumin: 3.8 g/dL (ref 3.5–5.0)
Alkaline Phosphatase: 86 U/L (ref 38–126)
Anion gap: 7 (ref 5–15)
BUN: 24 mg/dL — ABNORMAL HIGH (ref 8–23)
CO2: 26 mmol/L (ref 22–32)
Calcium: 9.7 mg/dL (ref 8.9–10.3)
Chloride: 108 mmol/L (ref 98–111)
Creatinine, Ser: 1.15 mg/dL — ABNORMAL HIGH (ref 0.44–1.00)
GFR calc Af Amer: 52 mL/min — ABNORMAL LOW (ref >60)
GFR calc non Af Amer: 45 mL/min — ABNORMAL LOW (ref >60)
Glucose, Bld: 127 mg/dL — ABNORMAL HIGH (ref 70–99)
Potassium: 3.8 mmol/L (ref 3.5–5.1)
Sodium: 141 mmol/L (ref 135–145)
Total Bilirubin: 0.7 mg/dL (ref 0.3–1.2)
Total Protein: 7 g/dL (ref 6.5–8.1)

## 2018-01-31 LAB — RETICULOCYTES
Immature Retic Fract: 9 % (ref 2.3–15.9)
RBC.: 4.26 MIL/uL (ref 3.87–5.11)
Retic Count, Absolute: 66.5 10*3/uL (ref 19.0–186.0)
Retic Ct Pct: 1.6 % (ref 0.4–3.1)

## 2018-01-31 LAB — FERRITIN: Ferritin: 41 ng/mL (ref 11–307)

## 2018-01-31 LAB — CEA (IN HOUSE-CHCC): CEA (CHCC-In House): 1.68 ng/mL (ref 0.00–5.00)

## 2018-01-31 MED ORDER — SODIUM CHLORIDE 0.9% FLUSH
10.0000 mL | INTRAVENOUS | Status: DC | PRN
  Administered 2018-01-31: 10 mL
  Filled 2018-01-31: qty 10

## 2018-01-31 MED ORDER — HEPARIN SOD (PORK) LOCK FLUSH 100 UNIT/ML IV SOLN
500.0000 [IU] | Freq: Once | INTRAVENOUS | Status: AC | PRN
  Administered 2018-01-31: 500 [IU]
  Filled 2018-01-31: qty 5

## 2018-01-31 NOTE — Progress Notes (Signed)
[name]   [phone] [phone]   Clinic Follow up Note   Patient Care Team: Tower, [name], MD as PCP - General [name], MD as Consulting Physician (Hematology) [name], MD as Attending Physician (Gastroenterology) [name], MD as Consulting Physician (General Surgery)  Date of Service:  01/31/2018  CHIEF COMPLAINT: F/u of colon cancer  SUMMARY OF ONCOLOGIC HISTORY: Oncology History   Cancer Staging Cancer of overlapping sites of colon [name]) Staging form: Colon and Rectum, AJCC 8th Edition - Pathologic stage from 11/10/2016: Stage IIIB (pT3(m), pN1c, cM0) - Signed by [name], MD on 12/16/2016       Cancer of overlapping sites of colon [name])   08/31/2016 Tumor Marker    Patient's tumor was tested for the following markers: CEA. Results of the tumor marker test revealed 2.0.    08/31/2016 Pathology Results    Colon biopsy Diagnosis: 1. Surgical [P], hepatic flexure. ADENOCARCINOMA. Moderately differentiated. 2. Surgical [P], transverse. ADENOCARCINOMA, Moderately differentiated.    08/31/2016 Initial Diagnosis    Colon cancer metastasized to liver ([name])    08/31/2016 Procedure    - 1) Polypoid mass at the hepatic flexure, 2cm, soft but clearly neoplastic.  - 2) Clearly malignant mass in the distal transverse colon; 4cm, firm, 1/2 cirumference, ulcerated. ' - 3) Four small (3-21m) scattered typical appearing adenomas throughout the colon, notremoved since she mistakenly stayed on her plavix for this procedure.    09/01/2016 Imaging    CT Abdomen Pelvis W Contrast IMPRESSION: There are 2 colonic lesions involving the proximal ascending colon and proximal transverse colon, as detailed above, highly concerning for colonic neoplasm. In addition, there are at least 2 indeterminate liver lesions which are suspicious for potential metastatic disease.     09/16/2016 Imaging    Mr Liver W Wo Contrast IMPRESSION: Lesion within  central right lobe of liver exhibits of peripheral enhancement and is suspicious for liver metastasis. Lesion within caudate lobe of liver identified on recent CT does not have a corresponding signal or enhancement abnormality on today's study. Within the inferior right lobe of liver there is an enhancing structure which is favored to represent an atypical benign hemangioma with liver metastasis felt less likely. The transverse colon lesion is again identified compatible with colonic adenocarcinoma.    10/04/2016 PET scan    PET 10/04/16 IMPRESSION: 1. Highly hypermetabolic proximal transverse colon mass, maximum SUV 30.8. Small focus of hypermetabolic activity in the mesentery just above this mass probably represents a local involved lymph node. 2. Moderately hypermetabolic ascending colon mass, maximum SUV 8.7. 3. Highly hypermetabolic enlarged portacaval lymph node, maximum SUV 16.7. 4. Focus of hypermetabolic activity near the duodenum bulb could be physiologic or due to an adjacent lymph node. 5. None of the liver lesions are discernibly hypermetabolic. Particularly the more cephalad right hepatic lobe lesion merits surveillance, it had nonspecific enhancement characteristics on prior cross-sectional imaging and only a thin enhancing wall which could conceivably predispose to false negative due to central necrosis. 6. Several tiny chronic pulmonary nodules are likely benign. 7. Other imaging findings of potential clinical significance: Aortic Atherosclerosis (ICD10-I70.0). Coronary atherosclerosis. Bilateral nonobstructive nephrolithiasis.    11/10/2016 Surgery    LAPAROSCOPIC EXTENDED RIGHT COLECTOMY by Dr. [name]    11/10/2016 Pathology Results    Diagnosis 11/10/16 Colon, segmental resection for tumor, Ascending and Transverse ADENOCARCINOMA OF THE ASCENDING COLON WITH EXTRA CELLULAR MUCIN, GRADE 3, SIZE 3.4 CM THE TUMOR INVADES MUSCULARIS PROPRIA POORLY  DIFFERENTIATED  ADENOCARCINOMA OF THE TRANSVERSE COLON, GRADE 4, SIZE 6.1 CM THE TUMOR INVADES THROUGH MUSCULARIS PROPRIA INTO PERICOLONIC SOFT TISSUE EXTRAMURAL SATELLITE TUMOR NODULES IS IDENTIFIED (X1) ALL MARGINS OF RESECTION ARE NEGATIVE FOR CARCINOMA METASTATIC CARCINOMA IN ONE OF SIXTY LYMPH NODES (1/60) TABULAR ADENOMA X1    12/28/2016 Imaging    MRI abdomen w/wo contrast IMPRESSION: Motion degraded images.  Two indeterminate right liver lesions measuring up to 1.6 cm, as described above. Continued attention on follow-up is suggested.  Two periportal lymph nodes measuring up to 1.9 cm, suspicious for nodal metastases.  Postsurgical changes related to right colon resection.     12/29/2016 - 04/11/2017 Chemotherapy    FOLFOX every 2 weeks starting 12/29/16 last cycle 7 on 04/11/17.     02/03/2017 Procedure    EUS Per [name] Endoscopic Finding Findings: The examined esophagus was endoscopically normal. The entire examined stomach was endoscopically normal. The examined duodenum was endoscopically normal. Endosonographic Finding (limited evaluation for tissue acquisition): 1. One enlarged lymph node (vs soft tissue mass) was visualized in the porta hepatis region. It measured 16 mm in maximal cross-sectional diameter. The node (vs soft tissue) was oval, isoechoic and had poorly defined margins. Fine needle aspiration for cytology was performed. Color Doppler imaging was utilized prior to needle puncture to confirm a lack of significant vascular structures within the needle path. Threepasses were made with the 25 gauge needle using a transgastric approach. 2. CBD was normal, non-dilated 3. Limited views of the liver, spleen, pancreas were all normal.  Impression: One periportal soft tissue mass (lymphnode?) measuring 40m was noted and sampled with FNA.    02/03/2017 Pathology Results    Diagnosis FINE NEEDLE ASPIRATION, ENDOSCOPIC, PERI PORTAL NODE (SPECIMEN 1 OF 1 COLLECTED  02/03/17): NO MALIGNANT CELLS IDENTIFIED. SCANT LYMPHOID AND BENIGN GLANDULAR ELEMENTS.    03/09/2017 Imaging    IMPRESSION: Interval decrease in several tiny low-attenuation liver lesions the, consistent with improving hepatic metastases.  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  TomorNext Lynch+ cancer Next was ordered throught he laboratory [facility]  TumorNext Lynch with CancerNext: Paired Germline and Tumor Analyses for EnhancedDiagnosis of Lynch Syndrome plus Analyses of 29 Additional Genes Associated with Hereditary Cancer.  Germline Genes analyzed: MLH1, MSH2, MSH6, PMS2, APC, ATM, BARD1, BMPR1A, BRIP1, CDH1, CDKN2A, CHEK2, DICER1, MRE11A, MUTYH, NBN, PALB2, PTEN, RAD50, RAD51C, RAD51D, SMAD4, STK11, TP53, CDK4, NF1, BRCA1, BRCA2, POLD1, POLE, SMARCA4, HOXB13 (sequencing and deletion/duplication); EPCAM, GREM1 (deletion/duplication only).  Results: Germline- Negative, no variants detected.  Tumor/Somatic-  BRAF V600E detected, No variants in KRAS or NRAS detected.  MLH1 promoter hypermethylation present, Microsatellite High.   Somatic pathogenic mutations in MSH2 c.687delA and pathogenic mutation in MSH6 c.3261dupC.  2 Somatic Variants of uncertain significance in MSH2 called p.A123T and p.D654N.   The date of this test report is 04/07/2017.      04/26/2017 - 06/12/2017 Chemotherapy    Xeloda 20054min the AM and 150027mn the PM started on 04/26/17. Changed to 1500m53mD on 05/16/17    06/13/2017 Imaging     CT CAP W Contrast 06/13/17 IMPRESSION: 1. Continued regression of hepatic metastatic disease. No new or progressive findings. 2. Surgical changes from a right hemicolectomy. No findings for recurrent cancer.  No mesenteric or retroperitoneal mass or  Adenopathy.    09/19/2017 Imaging    CT AP W Contrast 09/19/17  IMPRESSION: 1. The inferior right lobe of liver lesion appears slightly increased in size when compared with the previous exam. 2. No new liver lesions identified. 3. Stable appearance of the colon status post right hemicolectomy. 4.  Aortic Atherosclerosis (ICD10-I70.0).     10/01/2017 Imaging    MRI Abdomen W WO Contrast IMPRESSION: 1. Two small, subtle liver lesions are again identified involving segment 7 and segment 6. When compared with the MRI from 12/28/2016 these are not significantly changed. Compared with the MRI from 09/15/2016 the peripherally enhancing lesion within segment 7 has decreased in size in the interval. There has been no significant change in the segment 6 lesion. 2. No new foci of suspected metastasis identified.      CURRENT THERAPY:  Observation  INTERVAL HISTORY:  [name] is here for a follow up of her colon cancer. She presents to the clinic today by herself. She notes her port was able to be flushed today. She was not given heparin flush as she does not have time for this today. She will get it at next flush. She denies pain around [facility]. She notes neck stiffness lately but not related to this.  She notes no abdominal pain and has stable bowel movements and eating adequately and adequate energy. She continued to manages her horses well. She notes mild residual numbness in her feet from chemo, but no falls from this.  She notes she has not been taking her Xarelto BID. She pays $47 monthly for this.      REVIEW OF SYSTEMS:   Constitutional: Denies fevers, chills or abnormal weight loss Eyes: Denies blurriness of vision Ears, nose, mouth, throat, and face: Denies mucositis or sore throat Respiratory: Denies cough, dyspnea or wheezes Cardiovascular: Denies palpitation, chest discomfort or lower extremity swelling Gastrointestinal:  Denies nausea, heartburn or change in bowel  habits MSK: (+) Neck stiffness Skin: Denies abnormal skin rashes Lymphatics: Denies new lymphadenopathy or easy bruising Neurological:Denies tingling or new weaknesses (+) mild residual numbness in feet Behavioral/Psych: Mood is stable, no new changes  All other systems were reviewed with the patient and are negative.  MEDICAL HISTORY:  Past Medical History:  Diagnosis Date  . Anemia   . Arthritis   . Astigmatism    both eyes  . Cancer [facility])    colon surgery done last chemo tx 01-17-17  . Cataract   . Colon cancer ([employer])   . Coronary artery disease, non-occlusive   . Dyspnea    more so exertion  . Dysrhythmia    skipped beats every now and then  . GERD (gastroesophageal reflux disease)   . Glaucoma    both eyes right eye worse than left  . Hammer toes of both feet   . Headache    migraines with aura  . Heel spur    both heels and front  . HSV (herpes simplex virus) infection   . Hyperglycemia    denies  . Hyperlipidemia   . IBS (irritable bowel syndrome)   . IC (interstitial cystitis)   . Insomnia disorder related to known organic factor   . Osteoporosis   . PONV (postoperative nausea and vomiting)    has had more surgeries, and had no problems  . Stroke ([place]) 2016  . Vitamin D deficiency     SURGICAL HISTORY: Past Surgical History:  Procedure Laterality Date  .  ABDOMINAL HYSTERECTOMY     1 ovary and womb removed  . CARDIAC CATHETERIZATION  7/12   non obst dz -- not sure what yr  . CHOLECYSTECTOMY    . EUS N/A 02/03/2017   Procedure: UPPER ENDOSCOPIC ULTRASOUND (EUS) LINEAR;  Surgeon: [name], MD;  Location: [facility];  Service: Endoscopy;  Laterality: N/A;  . EYE SURGERY Bilateral    ioc for cataracts   . LAPAROSCOPIC RIGHT COLECTOMY Right 11/10/2016   Procedure: LAPAROSCOPIC EXTENDED RIGHT COLECTOMY;  Surgeon: [name], MD;  Location: [place];  Service: General;  Laterality: Right;  . PORTACATH PLACEMENT Left 12/22/2016   Procedure:  INSERTION PORT-A-CATH;  Surgeon: [name], MD;  Location: [place];  Service: General;  Laterality: Left;  . ROTATOR CUFF REPAIR Bilateral     I have reviewed the social history and family history with the patient and they are unchanged from previous note.  ALLERGIES:  is allergic to salmon [fish allergy]; ampicillin; codeine; flagyl [metronidazole]; meperidine hcl; neomycin; penicillins; propoxyphene hcl; sulfa antibiotics; and tramadol hcl.  MEDICATIONS:  Current Outpatient Medications  Medication Sig Dispense Refill  . atorvastatin (LIPITOR) 40 MG tablet Take 1 tablet (40 mg total) by mouth daily at 6 PM. 90 tablet 3  . b complex vitamins tablet Take 1 tablet by mouth daily.    . Cholecalciferol (VITAMIN D) 2000 units tablet Take 2,000 Units by mouth 3 (three) times a week.     . COMBIGAN 0.2-0.5 % ophthalmic solution INSTILL 1 DROP INTO BOTH EYES TWICE A DAY  1  . ELIQUIS 5 MG TABS tablet TAKE 1 TABLET BY MOUTH TWICE A DAY 60 tablet 2  . lactose free nutrition (BOOST) LIQD Take 237 mLs by mouth daily as needed.     . latanoprost (XALATAN) 0.005 % ophthalmic solution Place 1 drop into both eyes at bedtime.  5  . lidocaine-prilocaine (EMLA) cream Apply to affected area once (Patient taking differently: Apply to affected area once (near port)) 30 g 3  . Multiple Vitamin (MULTIVITAMIN WITH MINERALS) TABS tablet Take 1 tablet by mouth daily.    . potassium chloride SA (K-DUR,KLOR-CON) 20 MEQ tablet Take 1 tablet (20 mEq total) by mouth daily. 90 tablet 3   Current Facility-Administered Medications  Medication Dose Route Frequency Provider Last Rate Last Dose  . 0.9 %  sodium chloride infusion  500 mL Intravenous Once [name], MD        PHYSICAL EXAMINATION: ECOG PERFORMANCE STATUS: 0 - Asymptomatic  Vitals:   01/31/18 1359  BP: (!) 142/68  Pulse: 71  Resp: 18  Temp: 97.7 F (36.5 C)  SpO2: 100%   Filed Weights   01/31/18 1359  Weight: 153 lb 3.2  oz (69.5 kg)    GENERAL:alert, no distress and comfortable SKIN: skin color, texture, turgor are normal, no rashes or significant lesions EYES: normal, Conjunctiva are pink and non-injected, sclera clear OROPHARYNX:no exudate, no erythema and lips, buccal mucosa, and tongue normal  NECK: supple, thyroid normal size, non-tender, without nodularity LYMPH:  no palpable lymphadenopathy in the cervical, axillary or inguinal LUNGS: clear to auscultation and percussion with normal breathing effort HEART: regular rate & rhythm and no murmurs and no lower extremity edema ABDOMEN:abdomen soft, non-tender and normal bowel sounds, No hepatomegaly  Musculoskeletal:no cyanosis of digits and no clubbing  NEURO: alert & oriented x 3 with fluent speech, no focal motor/sensory deficits  LABORATORY DATA:  I have reviewed the data as listed CBC  Latest Ref Rng & Units 01/31/2018 09/19/2017 07/28/2017  WBC 4.0 - 10.5 K/uL 7.1 7.5 8.1  Hemoglobin 12.0 - 15.0 g/dL 12.6 12.6 12.7  Hematocrit 36.0 - 46.0 % 39.6 38.4 38.4  Platelets 150 - 400 K/uL 248 223 237.0     CMP Latest Ref Rng & Units 01/31/2018 09/19/2017 07/28/2017  Glucose 70 - 99 mg/dL 127(H) 90 97  BUN 8 - 23 mg/dL 24(H) 26(H) 28(H)  Creatinine 0.44 - 1.00 mg/dL 1.15(H) 0.87 0.98  Sodium 135 - 145 mmol/L 141 135 140  Potassium 3.5 - 5.1 mmol/L 3.8 4.4 4.1  Chloride 98 - 111 mmol/L 108 102 107  CO2 22 - 32 mmol/L '26 27 25  ' Calcium 8.9 - 10.3 mg/dL 9.7 9.3 9.7  Total Protein 6.5 - 8.1 g/dL 7.0 7.0 6.8  Total Bilirubin 0.3 - 1.2 mg/dL 0.7 0.7 0.5  Alkaline Phos 38 - 126 U/L 86 102 86  AST 15 - 41 U/L '20 27 21  ' ALT 0 - 44 U/L 20 32 24     PROCEDURES  COLONOSCOPY by [name] 10/18/17  IMPRESSION:  - Normal appearing right sided ileocolonic anastomosis. - 1.5cm long superficial tear in the colon mucosa noted during scope withdrawal (descending segment, see images). - No polyps or cancers.   RADIOGRAPHIC STUDIES: I have personally reviewed  the radiological images as listed and agreed with the findings in the report. No results found.   ASSESSMENT & PLAN:  Jewels Langone is a 77 y.o. female with   1. Synchronized colon adenocarcinoma, moderately differentiated, in hepatic flexure and distal transverse colon, pT3(m)N1cMx with indeterminate liver lesions, at least stage IIIB, MSI-H -She was diagnosed in 08/31/16. She is s/p right colectomy, adjuvant FOLFOX which was changed to Xeloda after cycle 6 due to poor tolerance.  -Initial staging scan showed 2 liver lesions, suspicious for liver metastasis, but not hypermetabolic on PET.  The liver lesion has decreased in size after adjuvant chemotherapy. -She is currently on observation -Down the road, if she has further disease progression, or intolerance to chemo, we can consider immunotherapy with Beryle Flock due to her MSI high disease. -Her 09/2017 colonoscopy with [name] was negative for malignancy -She is clinically doing well and stable. Her physical exam was unremarkable. Labs reviewed, BUN at 24 Cr at 1.15, CBC WNL. Her CEA and iron panel is still pending. I strongly encouraged her to increase water intake.  -Will get follow up MRI of liver lesions in 2 months  -F/u in 2 months  -Will return in 1 month to get heparin for her PAC.   2. Genetics -genetic testing were negative for Lynch Syndrome   3. Liver lesions  -Previously biopsy of the enlarged portal node was negative for metastasis, but also appear to shrink on chemotherapy based on repeat scans.  -Dr. Bethel Born feels her liver lesions are difficult to biopsy, and overall stable. -continue to monitor closely with follow up scans  4. History of stroke  -she is on aspirin and Plavix, will continue.  5. 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  I provided her with a discount card for medicine.   7.Atrial fibrillation, on Eliquis -Normal rhythm and rate now, will monitor   Plan:  -Port flush in 4 weeks (no blood return today) -F/u in 2 months with lab and CT chest wo contrast and abdomen MRI w wo contrast a few days before     No problem-specific Assessment & Plan notes found for this encounter.   Orders Placed This Encounter  Procedures  . CT Chest Wo Contrast    Standing Status:   Future    Standing Expiration Date:   01/31/2019    Order Specific Question:   Preferred imaging location?    Answer:   [facility]    Order Specific Question:   Radiology Contrast Protocol - do NOT remove file path    Answer:   \\[email or web address]  . MR Abdomen W Wo Contrast    Standing Status:   Future    Standing Expiration Date:   04/04/2019    Order Specific Question:   If indicated for the ordered procedure, I authorize the administration of contrast media per Radiology protocol    Answer:   Yes    Order Specific Question:   What is the patient's sedation requirement?    Answer:   No Sedation    Order Specific Question:   Does the patient have a pacemaker or implanted devices?    Answer:   Yes    Order Specific Question:   Radiology Contrast Protocol - do NOT remove file path    Answer:   \\[email or web address]    Order Specific Question:   Preferred imaging location?    Answer:   [facility] (table limit-350 lbs)   All questions were answered. The patient knows to call the clinic with any problems, questions or concerns. No barriers to learning was detected. I spent 20 minutes counseling the patient face to face. The total time spent in  the appointment was 25 minutes and more than 50% was on counseling and review of test results     [name], MD 01/31/2018   I, Joslyn Devon, am acting as scribe for [name], MD.   I have reviewed the above documentation for accuracy and completeness, and I agree with the above.

## 2018-01-31 NOTE — Telephone Encounter (Signed)
Gave patient avs report and appointments for January and February. Central radiology will call re scan.  °

## 2018-02-01 ENCOUNTER — Telehealth: Payer: Self-pay

## 2018-02-01 NOTE — Telephone Encounter (Signed)
Left voice message for patient regarding lab results.  Per Dr. Burr Medico notified her iron and tumor marker results were WNL.

## 2018-02-01 NOTE — Telephone Encounter (Signed)
-----   Message from [name], MD sent at 02/01/2018  7:35 AM EST ----- Please let pt know her iron and tumor marker were WNL, thanks  [name]  02/01/2018

## 2018-02-27 DIAGNOSIS — H401133 Primary open-angle glaucoma, bilateral, severe stage: Secondary | ICD-10-CM | POA: Diagnosis not present

## 2018-02-27 DIAGNOSIS — Z961 Presence of intraocular lens: Secondary | ICD-10-CM | POA: Diagnosis not present

## 2018-02-27 DIAGNOSIS — H43393 Other vitreous opacities, bilateral: Secondary | ICD-10-CM | POA: Diagnosis not present

## 2018-02-27 DIAGNOSIS — H10413 Chronic giant papillary conjunctivitis, bilateral: Secondary | ICD-10-CM | POA: Diagnosis not present

## 2018-02-28 ENCOUNTER — Inpatient Hospital Stay: Payer: Medicare Other | Attending: Nurse Practitioner

## 2018-03-15 ENCOUNTER — Telehealth: Payer: Self-pay

## 2018-03-15 NOTE — Telephone Encounter (Signed)
Patient requested to have a port appointment scheduled because she missed her appointment on 1/7. How ever she is scheduled for 2/5 for (lab, port) and then [place] on 2/7. I reminded the patient of these appointments and told her that I would need to get verification from Dr. Burr Medico before I could reschedule this appointment. She also stated that she told CT dept that she couldn't schedule with them until she get her port flushed. Per 1/22 voice msg return calls

## 2018-03-28 ENCOUNTER — Other Ambulatory Visit: Payer: Self-pay

## 2018-03-28 DIAGNOSIS — C188 Malignant neoplasm of overlapping sites of colon: Secondary | ICD-10-CM

## 2018-03-29 ENCOUNTER — Ambulatory Visit (HOSPITAL_COMMUNITY)
Admission: RE | Admit: 2018-03-29 | Discharge: 2018-03-29 | Disposition: A | Discharge status: Home / Self Care | Payer: Medicare Other | Source: Ambulatory Visit | Attending: Hematology | Admitting: Hematology

## 2018-03-29 ENCOUNTER — Inpatient Hospital Stay: Payer: Medicare Other

## 2018-03-29 ENCOUNTER — Inpatient Hospital Stay: Payer: Medicare Other | Attending: Nurse Practitioner

## 2018-03-29 DIAGNOSIS — C787 Secondary malignant neoplasm of liver and intrahepatic bile duct: Secondary | ICD-10-CM

## 2018-03-29 DIAGNOSIS — K769 Liver disease, unspecified: Secondary | ICD-10-CM | POA: Diagnosis not present

## 2018-03-29 DIAGNOSIS — C188 Malignant neoplasm of overlapping sites of colon: Secondary | ICD-10-CM

## 2018-03-29 DIAGNOSIS — R911 Solitary pulmonary nodule: Secondary | ICD-10-CM | POA: Diagnosis not present

## 2018-03-29 DIAGNOSIS — I4891 Unspecified atrial fibrillation: Secondary | ICD-10-CM | POA: Insufficient documentation

## 2018-03-29 DIAGNOSIS — Z86718 Personal history of other venous thrombosis and embolism: Secondary | ICD-10-CM | POA: Diagnosis not present

## 2018-03-29 DIAGNOSIS — K7689 Other specified diseases of liver: Secondary | ICD-10-CM | POA: Diagnosis not present

## 2018-03-29 DIAGNOSIS — R918 Other nonspecific abnormal finding of lung field: Secondary | ICD-10-CM | POA: Insufficient documentation

## 2018-03-29 DIAGNOSIS — C189 Malignant neoplasm of colon, unspecified: Secondary | ICD-10-CM

## 2018-03-29 DIAGNOSIS — I10 Essential (primary) hypertension: Secondary | ICD-10-CM | POA: Diagnosis not present

## 2018-03-29 DIAGNOSIS — Z7901 Long term (current) use of anticoagulants: Secondary | ICD-10-CM | POA: Diagnosis not present

## 2018-03-29 DIAGNOSIS — D500 Iron deficiency anemia secondary to blood loss (chronic): Secondary | ICD-10-CM

## 2018-03-29 LAB — COMPREHENSIVE METABOLIC PANEL
ALT: 19 U/L (ref 0–44)
AST: 19 U/L (ref 15–41)
Albumin: 3.7 g/dL (ref 3.5–5.0)
Alkaline Phosphatase: 84 U/L (ref 38–126)
Anion gap: 7 (ref 5–15)
BUN: 15 mg/dL (ref 8–23)
CO2: 26 mmol/L (ref 22–32)
Calcium: 9 mg/dL (ref 8.9–10.3)
Chloride: 107 mmol/L (ref 98–111)
Creatinine, Ser: 0.85 mg/dL (ref 0.44–1.00)
GFR calc Af Amer: >60 mL/min (ref >60)
GFR calc non Af Amer: >60 mL/min (ref >60)
Glucose, Bld: 92 mg/dL (ref 70–99)
Potassium: 4 mmol/L (ref 3.5–5.1)
Sodium: 140 mmol/L (ref 135–145)
Total Bilirubin: 0.7 mg/dL (ref 0.3–1.2)
Total Protein: 6.6 g/dL (ref 6.5–8.1)

## 2018-03-29 LAB — CBC WITH DIFFERENTIAL (CANCER CENTER ONLY)
Abs Immature Granulocytes: 0.02 10*3/uL (ref 0.00–0.07)
Basophils Absolute: 0.1 10*3/uL (ref 0.0–0.1)
Basophils Relative: 1 %
Eosinophils Absolute: 0.3 10*3/uL (ref 0.0–0.5)
Eosinophils Relative: 5 %
HCT: 36.6 % (ref 36.0–46.0)
Hemoglobin: 11.9 g/dL — ABNORMAL LOW (ref 12.0–15.0)
Immature Granulocytes: 0 %
Lymphocytes Relative: 26 %
Lymphs Abs: 1.8 10*3/uL (ref 0.7–4.0)
MCH: 29.8 pg (ref 26.0–34.0)
MCHC: 32.5 g/dL (ref 30.0–36.0)
MCV: 91.5 fL (ref 80.0–100.0)
Monocytes Absolute: 0.8 10*3/uL (ref 0.1–1.0)
Monocytes Relative: 12 %
Neutro Abs: 3.8 10*3/uL (ref 1.7–7.7)
Neutrophils Relative %: 56 %
Platelet Count: 242 10*3/uL (ref 150–400)
RBC: 4 MIL/uL (ref 3.87–5.11)
RDW: 13.5 % (ref 11.5–15.5)
WBC Count: 6.8 10*3/uL (ref 4.0–10.5)
nRBC: 0 % (ref 0.0–0.2)

## 2018-03-29 LAB — POCT I-STAT CREATININE: Creatinine, Ser: 0.8 mg/dL (ref 0.44–1.00)

## 2018-03-29 LAB — RETICULOCYTES
Immature Retic Fract: 11 % (ref 2.3–15.9)
RBC.: 4 MIL/uL (ref 3.87–5.11)
Retic Count, Absolute: 76.4 10*3/uL (ref 19.0–186.0)
Retic Ct Pct: 1.9 % (ref 0.4–3.1)

## 2018-03-29 LAB — FERRITIN: Ferritin: 61 ng/mL (ref 11–307)

## 2018-03-29 MED ORDER — GADOBUTROL 1 MMOL/ML IV SOLN
10.0000 mL | Freq: Once | INTRAVENOUS | Status: AC | PRN
  Administered 2018-03-29: 7 mL via INTRAVENOUS

## 2018-03-29 MED ORDER — HEPARIN SOD (PORK) LOCK FLUSH 100 UNIT/ML IV SOLN: 500.0000 [IU] | Freq: Once | INTRAVENOUS | Status: DC

## 2018-03-29 MED ORDER — HEPARIN SOD (PORK) LOCK FLUSH 100 UNIT/ML IV SOLN
INTRAVENOUS | Status: AC
  Filled 2018-03-29: qty 5

## 2018-03-29 MED ORDER — HEPARIN SOD (PORK) LOCK FLUSH 100 UNIT/ML IV SOLN
500.0000 [IU] | Freq: Once | INTRAVENOUS | Status: AC
  Administered 2018-03-29: 500 [IU] via INTRAVENOUS

## 2018-03-29 MED ORDER — SODIUM CHLORIDE 0.9% FLUSH
10.0000 mL | Freq: Once | INTRAVENOUS | Status: AC
  Administered 2018-03-29: 10 mL
  Filled 2018-03-29: qty 10

## 2018-03-30 NOTE — Progress Notes (Signed)
[place]   Telephone:(336) [phone] Fax:([phone]) [phone]   Clinic Follow up Note   Patient Care Team: Tower, [name], MD as PCP - General [name], MD as Consulting Physician (Hematology) [name], MD as Attending Physician (Gastroenterology) [name], MD as Consulting Physician (General Surgery)  Date of Service:  03/31/2018  CHIEF COMPLAINT: F/u of colon cancer  SUMMARY OF ONCOLOGIC HISTORY: Oncology History   Cancer Staging Cancer of overlapping sites of colon [facility]) Staging form: Colon and Rectum, AJCC 8th Edition - Pathologic stage from 11/10/2016: Stage IIIB (pT3(m), pN1c, cM0) - Signed by [name], MD on 12/16/2016       Cancer of overlapping sites of colon [facility])   08/31/2016 Tumor Marker    Patient's tumor was tested for the following markers: CEA. Results of the tumor marker test revealed 2.0.    08/31/2016 Pathology Results    Colon biopsy Diagnosis: 1. Surgical [P], hepatic flexure. ADENOCARCINOMA. Moderately differentiated. 2. Surgical [P], transverse. ADENOCARCINOMA, Moderately differentiated.    08/31/2016 Initial Diagnosis    Colon cancer metastasized to liver ([place])    08/31/2016 Procedure    - 1) Polypoid mass at the hepatic flexure, 2cm, soft but clearly neoplastic.  - 2) Clearly malignant mass in the distal transverse colon; 4cm, firm, 1/2 cirumference, ulcerated. ' - 3) Four small (3-30m) scattered typical appearing adenomas throughout the colon, notremoved since she mistakenly stayed on her plavix for this procedure.    09/01/2016 Imaging    CT Abdomen Pelvis W Contrast IMPRESSION: There are 2 colonic lesions involving the proximal ascending colon and proximal transverse colon, as detailed above, highly concerning for colonic neoplasm. In addition, there are at least 2 indeterminate liver lesions which are suspicious for potential metastatic disease.     09/16/2016 Imaging    Mr Liver W Wo Contrast IMPRESSION: Lesion within central  right lobe of liver exhibits of peripheral enhancement and is suspicious for liver metastasis. Lesion within caudate lobe of liver identified on recent CT does not have a corresponding signal or enhancement abnormality on today's study. Within the inferior right lobe of liver there is an enhancing structure which is favored to represent an atypical benign hemangioma with liver metastasis felt less likely. The transverse colon lesion is again identified compatible with colonic adenocarcinoma.    10/04/2016 PET scan    PET 10/04/16 IMPRESSION: 1. Highly hypermetabolic proximal transverse colon mass, maximum SUV 30.8. Small focus of hypermetabolic activity in the mesentery just above this mass probably represents a local involved lymph node. 2. Moderately hypermetabolic ascending colon mass, maximum SUV 8.7. 3. Highly hypermetabolic enlarged portacaval lymph node, maximum SUV 16.7. 4. Focus of hypermetabolic activity near the duodenum bulb could be physiologic or due to an adjacent lymph node. 5. None of the liver lesions are discernibly hypermetabolic. Particularly the more cephalad right hepatic lobe lesion merits surveillance, it had nonspecific enhancement characteristics on prior cross-sectional imaging and only a thin enhancing wall which could conceivably predispose to false negative due to central necrosis. 6. Several tiny chronic pulmonary nodules are likely benign. 7. Other imaging findings of potential clinical significance: Aortic Atherosclerosis (ICD10-I70.0). Coronary atherosclerosis. Bilateral nonobstructive nephrolithiasis.    11/10/2016 Surgery    LAPAROSCOPIC EXTENDED RIGHT COLECTOMY by Dr. [name]    11/10/2016 Pathology Results    Diagnosis 11/10/16 Colon, segmental resection for tumor, Ascending and Transverse ADENOCARCINOMA OF THE ASCENDING COLON WITH EXTRA CELLULAR MUCIN, GRADE 3, SIZE 3.4 CM THE TUMOR INVADES MUSCULARIS PROPRIA POORLY  DIFFERENTIATED ADENOCARCINOMA OF  THE TRANSVERSE COLON, GRADE 4, SIZE 6.1 CM THE TUMOR INVADES THROUGH MUSCULARIS PROPRIA INTO PERICOLONIC SOFT TISSUE EXTRAMURAL SATELLITE TUMOR NODULES IS IDENTIFIED (X1) ALL MARGINS OF RESECTION ARE NEGATIVE FOR CARCINOMA METASTATIC CARCINOMA IN ONE OF SIXTY LYMPH NODES (1/60) TABULAR ADENOMA X1    12/28/2016 Imaging    MRI abdomen w/wo contrast IMPRESSION: Motion degraded images.  Two indeterminate right liver lesions measuring up to 1.6 cm, as described above. Continued attention on follow-up is suggested.  Two periportal lymph nodes measuring up to 1.9 cm, suspicious for nodal metastases.  Postsurgical changes related to right colon resection.     12/29/2016 - 04/11/2017 Chemotherapy    FOLFOX every 2 weeks starting 12/29/16 last cycle 7 on 04/11/17.     02/03/2017 Procedure    EUS Per [name] Endoscopic Finding Findings: The examined esophagus was endoscopically normal. The entire examined stomach was endoscopically normal. The examined duodenum was endoscopically normal. Endosonographic Finding (limited evaluation for tissue acquisition): 1. One enlarged lymph node (vs soft tissue mass) was visualized in the porta hepatis region. It measured 16 mm in maximal cross-sectional diameter. The node (vs soft tissue) was oval, isoechoic and had poorly defined margins. Fine needle aspiration for cytology was performed. Color Doppler imaging was utilized prior to needle puncture to confirm a lack of significant vascular structures within the needle path. Threepasses were made with the 25 gauge needle using a transgastric approach. 2. CBD was normal, non-dilated 3. Limited views of the liver, spleen, pancreas were all normal.  Impression: One periportal soft tissue mass (lymphnode?) measuring 79m was noted and sampled with FNA.    02/03/2017 Pathology Results    Diagnosis FINE NEEDLE ASPIRATION, ENDOSCOPIC, PERI PORTAL NODE (SPECIMEN 1 OF 1 COLLECTED 02/03/17): NO MALIGNANT  CELLS IDENTIFIED. SCANT LYMPHOID AND BENIGN GLANDULAR ELEMENTS.    03/09/2017 Imaging    IMPRESSION: Interval decrease in several tiny low-attenuation liver lesions the, consistent with improving hepatic metastases.  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  TomorNext Lynch+ cancer Next was ordered throught he laboratory [facility]  TumorNext Lynch with CancerNext: Paired Germline and Tumor Analyses for EnhancedDiagnosis of Lynch Syndrome plus Analyses of 29 Additional Genes Associated with Hereditary Cancer.  Germline Genes analyzed: MLH1, MSH2, MSH6, PMS2, APC, ATM, BARD1, BMPR1A, BRIP1, CDH1, CDKN2A, CHEK2, DICER1, MRE11A, MUTYH, NBN, PALB2, PTEN, RAD50, RAD51C, RAD51D, SMAD4, STK11, TP53, CDK4, NF1, BRCA1, BRCA2, POLD1, POLE, SMARCA4, HOXB13 (sequencing and deletion/duplication); EPCAM, GREM1 (deletion/duplication only).  Results: Germline- Negative, no variants detected.  Tumor/Somatic-  BRAF V600E detected, No variants in KRAS or NRAS detected.  MLH1 promoter hypermethylation present, Microsatellite High.   Somatic pathogenic mutations in MSH2 c.687delA and pathogenic mutation in MSH6 c.3261dupC.  2 Somatic Variants of uncertain significance in MSH2 called p.A123T and p.D654N.   The date of this test report is 04/07/2017.      04/26/2017 - 06/12/2017 Chemotherapy    Xeloda 2009min the AM and 150010mn the PM started on 04/26/17. Changed to 1500m2mD on 05/16/17    06/13/2017 Imaging     CT CAP W Contrast 06/13/17 IMPRESSION: 1. Continued regression of hepatic metastatic disease. No new or progressive findings. 2. Surgical changes from a right hemicolectomy. No findings for recurrent cancer.  No mesenteric or retroperitoneal mass or Adenopathy.    09/19/2017  Imaging    CT AP W Contrast 09/19/17  IMPRESSION: 1. The inferior right lobe of liver lesion appears slightly increased in size when compared with the previous exam. 2. No new liver lesions identified. 3. Stable appearance of the colon status post right hemicolectomy. 4.  Aortic Atherosclerosis (ICD10-I70.0).     10/01/2017 Imaging    MRI Abdomen W WO Contrast IMPRESSION: 1. Two small, subtle liver lesions are again identified involving segment 7 and segment 6. When compared with the MRI from 12/28/2016 these are not significantly changed. Compared with the MRI from 09/15/2016 the peripherally enhancing lesion within segment 7 has decreased in size in the interval. There has been no significant change in the segment 6 lesion. 2. No new foci of suspected metastasis identified.    03/29/2018 Imaging    MRI Abdomen  IMPRESSION: 1. The 2 small subtle liver lesions involving segment 7 and segment 6 are again noted. When compared with 10/01/2017 these are stable to minimally decreased in size. No new liver lesion identified. 2.  Aortic Atherosclerosis (ICD10-I70.0).     03/30/2018 Imaging    CT chest  IMPRESSION: 1. Solitary new solid pulmonary nodule in the anterior right middle lobe measuring 4 mm. Recommend attention on follow-up chest CT in 3 months. 2. No additional potential findings of metastatic disease in he chest. 3. Nonobstructing left nephrolithiasis. 4. One vessel coronary atherosclerosis.  Aortic Atherosclerosis (ICD10-I70.0).       CURRENT THERAPY:  Observation  INTERVAL HISTORY:  Jaquay Morneault is here for a follow up of colon cancer and to discuss her latest scans. She presents to the clinic today alone and is feeling well. In the past months she has been feeling she has had some SOB but she feels fine now. She c/o mild lower abdominal  pain that is improving. She has normal BMs since her surgery but they can vary depending on what she eats. She has  a good appetite, has gained some weight but wants to do abdominal exercises to do lose the weight. Recently she had a dizzy episode after scans and labs. I reviewed her medication list.       REVIEW OF SYSTEMS:   Constitutional: Denies fevers, chills or abnormal weight loss, (+) good appetite, (+) weight gain  Eyes: Denies blurriness of vision Ears, nose, mouth, throat, and face: Denies mucositis or sore throat Respiratory: Denies cough, dyspnea or wheezes Cardiovascular: Denies palpitation, chest discomfort or lower extremity swelling Gastrointestinal:  Denies nausea, heartburn or change in bowel habits, (+) mild lower abdominal pain Skin: Denies abnormal skin rashes Lymphatics: Denies new lymphadenopathy or easy bruising Neurological:Denies numbness, tingling or new weaknesses Behavioral/Psych: Mood is stable, no new changes  All other systems were reviewed with the patient and are negative.  MEDICAL HISTORY:  Past Medical History:  Diagnosis Date  . Anemia   . Arthritis   . Astigmatism    both eyes  . Cancer [facility])    colon surgery done last chemo tx 01-17-17  . Cataract   . Colon cancer ([place])   . Coronary artery disease, non-occlusive   . Dyspnea    more so exertion  . Dysrhythmia    skipped beats every now and then  . GERD (gastroesophageal reflux disease)   . Glaucoma    both eyes right eye worse than left  . Hammer toes of both feet   . Headache    migraines  with aura  . Heel spur    both heels and front  . HSV (herpes simplex virus) infection   . Hyperglycemia    denies  . Hyperlipidemia   . IBS (irritable bowel syndrome)   . IC (interstitial cystitis)   . Insomnia disorder related to known organic factor   . Osteoporosis   . PONV (postoperative nausea and vomiting)    has had more surgeries, and had no problems  . Stroke ([place]) 2016  . Vitamin D deficiency     SURGICAL HISTORY: Past Surgical History:  Procedure Laterality Date  . ABDOMINAL  HYSTERECTOMY     1 ovary and womb removed  . CARDIAC CATHETERIZATION  7/12   non obst dz -- not sure what yr  . CHOLECYSTECTOMY    . EUS N/A 02/03/2017   Procedure: UPPER ENDOSCOPIC ULTRASOUND (EUS) LINEAR;  Surgeon: [name], MD;  Location: [facility];  Service: Endoscopy;  Laterality: N/A;  . EYE SURGERY Bilateral    ioc for cataracts   . LAPAROSCOPIC RIGHT COLECTOMY Right 11/10/2016   Procedure: LAPAROSCOPIC EXTENDED RIGHT COLECTOMY;  Surgeon: [name], MD;  Location: [place];  Service: General;  Laterality: Right;  . PORTACATH PLACEMENT Left 12/22/2016   Procedure: INSERTION PORT-A-CATH;  Surgeon: [name], MD;  Location: [place];  Service: General;  Laterality: Left;  . ROTATOR CUFF REPAIR Bilateral     I have reviewed the social history and family history with the patient and they are unchanged from previous note.  ALLERGIES:  is allergic to salmon [fish allergy]; ampicillin; codeine; flagyl [metronidazole]; meperidine hcl; neomycin; penicillins; propoxyphene hcl; sulfa antibiotics; and tramadol hcl.  MEDICATIONS:  Current Outpatient Medications  Medication Sig Dispense Refill  . atorvastatin (LIPITOR) 40 MG tablet Take 1 tablet (40 mg total) by mouth daily at 6 PM. 90 tablet 3  . b complex vitamins tablet Take 1 tablet by mouth daily.    . Cholecalciferol (VITAMIN D) 2000 units tablet Take 2,000 Units by mouth 3 (three) times a week.     . COMBIGAN 0.2-0.5 % ophthalmic solution INSTILL 1 DROP INTO BOTH EYES TWICE A DAY  1  . ELIQUIS 5 MG TABS tablet TAKE 1 TABLET BY MOUTH TWICE A DAY 60 tablet 2  . lactose free nutrition (BOOST) LIQD Take 237 mLs by mouth daily as needed.     . latanoprost (XALATAN) 0.005 % ophthalmic solution Place 1 drop into both eyes at bedtime.  5  . lidocaine-prilocaine (EMLA) cream Apply to affected area once (Patient taking differently: Apply to affected area once (near port)) 30 g 3  . Multiple Vitamin (MULTIVITAMIN  WITH MINERALS) TABS tablet Take 1 tablet by mouth daily.    . potassium chloride SA (K-DUR,KLOR-CON) 20 MEQ tablet Take 1 tablet (20 mEq total) by mouth daily. 90 tablet 3   Current Facility-Administered Medications  Medication Dose Route Frequency Provider Last Rate Last Dose  . 0.9 %  sodium chloride infusion  500 mL Intravenous Once [name], MD        PHYSICAL EXAMINATION: ECOG PERFORMANCE STATUS: 0 - Asymptomatic  Vitals:   03/31/18 1327  BP: (!) 141/64  Pulse: 76  Resp: 18  Temp: 98 F (36.7 C)  SpO2: 99%   Filed Weights   03/31/18 1327  Weight: 158 lb 4.8 oz (71.8 kg)    GENERAL:alert, no distress and comfortable SKIN: skin color, texture, turgor are normal, no rashes or significant lesions EYES: normal, Conjunctiva are  pink and non-injected, sclera clear OROPHARYNX:no exudate, no erythema and lips, buccal mucosa, and tongue normal  NECK: supple, thyroid normal size, non-tender, without nodularity LYMPH:  no palpable lymphadenopathy in the cervical, axillary or inguinal LUNGS: clear to auscultation and percussion with normal breathing effort HEART: regular rate & rhythm and no murmurs and no lower extremity edema ABDOMEN:abdomen soft, non-tender and normal bowel sounds Musculoskeletal:no cyanosis of digits and no clubbing  NEURO: alert & oriented x 3 with fluent speech, no focal motor/sensory deficits  LABORATORY DATA:  I have reviewed the data as listed CBC Latest Ref Rng & Units 03/29/2018 01/31/2018 09/19/2017  WBC 4.0 - 10.5 K/uL 6.8 7.1 7.5  Hemoglobin 12.0 - 15.0 g/dL 11.9(L) 12.6 12.6  Hematocrit 36.0 - 46.0 % 36.6 39.6 38.4  Platelets 150 - 400 K/uL 242 248 223     CMP Latest Ref Rng & Units 03/29/2018 03/29/2018 01/31/2018  Glucose 70 - 99 mg/dL - 92 127(H)  BUN 8 - 23 mg/dL - 15 24(H)  Creatinine 0.44 - 1.00 mg/dL 0.80 0.85 1.15(H)  Sodium 135 - 145 mmol/L - 140 141  Potassium 3.5 - 5.1 mmol/L - 4.0 3.8  Chloride 98 - 111 mmol/L - 107 108  CO2  22 - 32 mmol/L - 26 26  Calcium 8.9 - 10.3 mg/dL - 9.0 9.7  Total Protein 6.5 - 8.1 g/dL - 6.6 7.0  Total Bilirubin 0.3 - 1.2 mg/dL - 0.7 0.7  Alkaline Phos 38 - 126 U/L - 84 86  AST 15 - 41 U/L - 19 20  ALT 0 - 44 U/L - 19 20      RADIOGRAPHIC STUDIES: I have personally reviewed the radiological images as listed and agreed with the findings in the report. Ct Chest Wo Contrast  Result Date: 03/30/2018 CLINICAL DATA:  Colon cancer status post extended right colectomy 11/10/2016 and chemotherapy. Chest restaging. EXAM: CT CHEST WITHOUT CONTRAST TECHNIQUE: Multidetector CT imaging of the chest was performed following the standard protocol without IV contrast. COMPARISON:  03/09/2017 chest CT angiogram. FINDINGS: Cardiovascular: Normal heart size. No significant pericardial effusion/thickening. Left anterior descending coronary atherosclerosis. Atherosclerotic nonaneurysmal thoracic aorta. Normal caliber pulmonary arteries. Left subclavian Port-A-Cath terminates in the upper third of the SVC. Mediastinum/Nodes: No discrete thyroid nodules. Unremarkable esophagus. No pathologically enlarged axillary, mediastinal or hilar lymph nodes, noting limited sensitivity for the detection of hilar adenopathy on this noncontrast study. Lungs/Pleura: No pneumothorax. No pleural effusion. No acute consolidative airspace disease or lung masses. There is 1 new solid pulmonary nodule in the anterior right middle lobe measuring 4 mm (series 5/image 83). Few additional scattered solid pulmonary nodules in both lungs measuring up to 5 mm in the right middle lobe (series 5/image 75) are all stable since 09/11/2010 coronary CT and are considered benign. No additional new significant pulmonary nodules. Upper abdomen: Cholecystectomy. Nonobstructing 8 mm upper left renal stone. Musculoskeletal: No aggressive appearing focal osseous lesions. Moderate thoracic spondylosis. IMPRESSION: 1. Solitary new solid pulmonary nodule in the  anterior right middle lobe measuring 4 mm. Recommend attention on follow-up chest CT in 3 months. 2. No additional potential findings of metastatic disease in the chest. 3. Nonobstructing left nephrolithiasis. 4. One vessel coronary atherosclerosis. Aortic Atherosclerosis (ICD10-I70.0). Electronically Signed   By: Ilona Sorrel M.D.   On: 03/30/2018 09:18     ASSESSMENT & PLAN:  Jaquelyne Firkus is a 78 y.o. female with   1. Synchronized colon adenocarcinoma, moderately differentiated, in hepatic flexure and distal transverse colon,  pT3(m)N1cMx with indeterminate liver lesions, at least stage IIIB, MSI-H -She was diagnosed in 08/31/16. She is s/p right colectomy, adjuvant FOLFOX which was changed to Xeloda after cycle 6 due to poor tolerance.  -Initial staging scan showed 2 liver lesions, suspicious for liver metastasis, but not hypermetabolic on PET. The liver lesion has decreased in size after adjuvant chemotherapy. -She is currently on observation -She is clinically doing well, asymptomatic.  She has recovered well from chemotherapy.  Exam was unremarkable. -I reviewed her recent restaging CT scan, which showed a new 4 mm right upper lobe lung nodule, her previous liver lesion has slightly decreased in size, no other new lesions.  Her scan remains to be suspicious for metastatic disease. -Fair, CBC, CMP, and iron studies are within normal limits.  Her tumor marker CEA was normal 2 months ago -Continue clinical surveillance, plan to repeat a CT chest in 3 months.   2. Genetics was negative  3. Liver lesions  -Previousbiopsy of theenlarged portal node was negative for metastasis, but also appear to shrink on chemotherapy based on repeat scans.  -Dr. Bethel Born feels her liver lesions are difficult to biopsy, and overall stable. -continue to monitor closely with follow up scans.  Her recent CT scan showed a started decrease of the size of the liver lesions, no other new lesions.  4.  History of stroke  -she is on aspirin and Plavix, will continue.  5. 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 I provided her with a discount card for medicine.   -Contonue Eliquis 30m330mID for AF  7.Atrial fibrillation -on Eliquis -continue monitoring     Plan:  - f/u in 3 months with CT chest wo contrast and lab a few days before     No problem-specific Assessment & Plan notes found for this encounter.   Orders Placed This Encounter  Procedures  . CT Chest Wo Contrast    Standing Status:   Future    Standing Expiration Date:   03/31/2019    Order Specific Question:   [facility]    [facility] Order Specific Question:   Radiology Contrast Protocol - do NOT remove file path    Answer:   [email or web address]   All questions were answered. The patient knows to call the clinic with any problems, questions or concerns. No barriers to learning was detected. I spent 20 minutes counseling the patient face to face. The total time spent in the appointment was 25 minutes and more than 50% was on counseling and review of test results     YanTruitt MerleD 03/31/2018   I, DiaManson Allanm acting as scribe for YanTruitt MerleD.   I have reviewed the above documentation for accuracy and completeness, and I agree with the above.

## 2018-03-31 ENCOUNTER — Inpatient Hospital Stay (HOSPITAL_BASED_OUTPATIENT_CLINIC_OR_DEPARTMENT_OTHER): Payer: Medicare Other | Admitting: Hematology

## 2018-03-31 ENCOUNTER — Telehealth: Payer: Self-pay | Admitting: Hematology

## 2018-03-31 VITALS — BP 141/64 | HR 76 | Temp 98.0°F | Resp 18 | Ht 64.0 in | Wt 158.3 lb

## 2018-03-31 DIAGNOSIS — I4891 Unspecified atrial fibrillation: Secondary | ICD-10-CM | POA: Diagnosis not present

## 2018-03-31 DIAGNOSIS — C787 Secondary malignant neoplasm of liver and intrahepatic bile duct: Secondary | ICD-10-CM

## 2018-03-31 DIAGNOSIS — R918 Other nonspecific abnormal finding of lung field: Secondary | ICD-10-CM | POA: Diagnosis not present

## 2018-03-31 DIAGNOSIS — C188 Malignant neoplasm of overlapping sites of colon: Secondary | ICD-10-CM

## 2018-03-31 DIAGNOSIS — I10 Essential (primary) hypertension: Secondary | ICD-10-CM | POA: Diagnosis not present

## 2018-03-31 DIAGNOSIS — Z7901 Long term (current) use of anticoagulants: Secondary | ICD-10-CM | POA: Diagnosis not present

## 2018-03-31 DIAGNOSIS — Z86718 Personal history of other venous thrombosis and embolism: Secondary | ICD-10-CM | POA: Diagnosis not present

## 2018-03-31 NOTE — Telephone Encounter (Signed)
Scheduled appt per 02/7 los.  Printed calendar and avs.

## 2018-04-01 ENCOUNTER — Encounter: Payer: Self-pay | Admitting: Hematology

## 2018-04-17 DIAGNOSIS — H401133 Primary open-angle glaucoma, bilateral, severe stage: Secondary | ICD-10-CM | POA: Diagnosis not present

## 2018-04-17 DIAGNOSIS — Z961 Presence of intraocular lens: Secondary | ICD-10-CM | POA: Diagnosis not present

## 2018-04-17 DIAGNOSIS — H43393 Other vitreous opacities, bilateral: Secondary | ICD-10-CM | POA: Diagnosis not present

## 2018-05-07 ENCOUNTER — Other Ambulatory Visit: Payer: Self-pay | Admitting: Nurse Practitioner

## 2018-06-14 ENCOUNTER — Telehealth: Payer: Self-pay | Admitting: Hematology

## 2018-06-14 NOTE — Telephone Encounter (Signed)
Scheduled appt per 4/21 sch message - pt is aware of appt date and time   

## 2018-06-23 NOTE — Progress Notes (Signed)
[name]   [phone] [phone]   Clinic Follow up Note   Patient Care Team: Tower, [name], MD as PCP - General [name], MD as Consulting Physician (Hematology) [name], MD as Attending Physician (Gastroenterology) [name], MD as Consulting Physician (General Surgery)  Date of Service:  06/26/2018  CHIEF COMPLAINT: F/u of colon cancer  SUMMARY OF ONCOLOGIC HISTORY: Oncology History   Cancer Staging Cancer of overlapping sites of colon [facility]) Staging form: Colon and Rectum, AJCC 8th Edition - Pathologic stage from 11/10/2016: Stage IIIB (pT3(m), pN1c, cM0) - Signed by [name], MD on 12/16/2016       Cancer of overlapping sites of colon [facility])   08/31/2016 Tumor Marker    Patient's tumor was tested for the following markers: CEA. Results of the tumor marker test revealed 2.0.    08/31/2016 Pathology Results    Colon biopsy Diagnosis: 1. Surgical [P], hepatic flexure. ADENOCARCINOMA. Moderately differentiated. 2. Surgical [P], transverse. ADENOCARCINOMA, Moderately differentiated.    08/31/2016 Initial Diagnosis    Colon cancer metastasized to liver ([place])    08/31/2016 Procedure    - 1) Polypoid mass at the hepatic flexure, 2cm, soft but clearly neoplastic.  - 2) Clearly malignant mass in the distal transverse colon; 4cm, firm, 1/2 cirumference, ulcerated. ' - 3) Four small (3-83m) scattered typical appearing adenomas throughout the colon, notremoved since she mistakenly stayed on her plavix for this procedure.    09/01/2016 Imaging    CT Abdomen Pelvis W Contrast IMPRESSION: There are 2 colonic lesions involving the proximal ascending colon and proximal transverse colon, as detailed above, highly concerning for colonic neoplasm. In addition, there are at least 2 indeterminate liver lesions which are suspicious for potential metastatic disease.     09/16/2016 Imaging    Mr Liver W Wo Contrast IMPRESSION: Lesion within central  right lobe of liver exhibits of peripheral enhancement and is suspicious for liver metastasis. Lesion within caudate lobe of liver identified on recent CT does not have a corresponding signal or enhancement abnormality on today's study. Within the inferior right lobe of liver there is an enhancing structure which is favored to represent an atypical benign hemangioma with liver metastasis felt less likely. The transverse colon lesion is again identified compatible with colonic adenocarcinoma.    10/04/2016 PET scan    PET 10/04/16 IMPRESSION: 1. Highly hypermetabolic proximal transverse colon mass, maximum SUV 30.8. Small focus of hypermetabolic activity in the mesentery just above this mass probably represents a local involved lymph node. 2. Moderately hypermetabolic ascending colon mass, maximum SUV 8.7. 3. Highly hypermetabolic enlarged portacaval lymph node, maximum SUV 16.7. 4. Focus of hypermetabolic activity near the duodenum bulb could be physiologic or due to an adjacent lymph node. 5. None of the liver lesions are discernibly hypermetabolic. Particularly the more cephalad right hepatic lobe lesion merits surveillance, it had nonspecific enhancement characteristics on prior cross-sectional imaging and only a thin enhancing wall which could conceivably predispose to false negative due to central necrosis. 6. Several tiny chronic pulmonary nodules are likely benign. 7. Other imaging findings of potential clinical significance: Aortic Atherosclerosis (ICD10-I70.0). Coronary atherosclerosis. Bilateral nonobstructive nephrolithiasis.    11/10/2016 Surgery    LAPAROSCOPIC EXTENDED RIGHT COLECTOMY by Dr. [name]    11/10/2016 Pathology Results    Diagnosis 11/10/16 Colon, segmental resection for tumor, Ascending and Transverse ADENOCARCINOMA OF THE ASCENDING COLON WITH EXTRA CELLULAR MUCIN, GRADE 3, SIZE 3.4 CM THE TUMOR INVADES MUSCULARIS PROPRIA POORLY  DIFFERENTIATED ADENOCARCINOMA OF  THE TRANSVERSE COLON, GRADE 4, SIZE 6.1 CM THE TUMOR INVADES THROUGH MUSCULARIS PROPRIA INTO PERICOLONIC SOFT TISSUE EXTRAMURAL SATELLITE TUMOR NODULES IS IDENTIFIED (X1) ALL MARGINS OF RESECTION ARE NEGATIVE FOR CARCINOMA METASTATIC CARCINOMA IN ONE OF SIXTY LYMPH NODES (1/60) TABULAR ADENOMA X1    12/28/2016 Imaging    MRI abdomen w/wo contrast IMPRESSION: Motion degraded images.  Two indeterminate right liver lesions measuring up to 1.6 cm, as described above. Continued attention on follow-up is suggested.  Two periportal lymph nodes measuring up to 1.9 cm, suspicious for nodal metastases.  Postsurgical changes related to right colon resection.     12/29/2016 - 04/11/2017 Chemotherapy    FOLFOX every 2 weeks starting 12/29/16 last cycle 7 on 04/11/17.     02/03/2017 Procedure    EUS Per [name] Endoscopic Finding Findings: The examined esophagus was endoscopically normal. The entire examined stomach was endoscopically normal. The examined duodenum was endoscopically normal. Endosonographic Finding (limited evaluation for tissue acquisition): 1. One enlarged lymph node (vs soft tissue mass) was visualized in the porta hepatis region. It measured 16 mm in maximal cross-sectional diameter. The node (vs soft tissue) was oval, isoechoic and had poorly defined margins. Fine needle aspiration for cytology was performed. Color Doppler imaging was utilized prior to needle puncture to confirm a lack of significant vascular structures within the needle path. Threepasses were made with the 25 gauge needle using a transgastric approach. 2. CBD was normal, non-dilated 3. Limited views of the liver, spleen, pancreas were all normal.  Impression: One periportal soft tissue mass (lymphnode?) measuring 59m was noted and sampled with FNA.    02/03/2017 Pathology Results    Diagnosis FINE NEEDLE ASPIRATION, ENDOSCOPIC, PERI PORTAL NODE (SPECIMEN 1 OF 1 COLLECTED 02/03/17): NO MALIGNANT  CELLS IDENTIFIED. SCANT LYMPHOID AND BENIGN GLANDULAR ELEMENTS.    03/09/2017 Imaging    IMPRESSION: Interval decrease in several tiny low-attenuation liver lesions the, consistent with improving hepatic metastases.  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  TomorNext Lynch+ cancer Next was ordered throught he laboratory [facility]  TumorNext Lynch with CancerNext: Paired Germline and Tumor Analyses for EnhancedDiagnosis of Lynch Syndrome plus Analyses of 29 Additional Genes Associated with Hereditary Cancer.  Germline Genes analyzed: MLH1, MSH2, MSH6, PMS2, APC, ATM, BARD1, BMPR1A, BRIP1, CDH1, CDKN2A, CHEK2, DICER1, MRE11A, MUTYH, NBN, PALB2, PTEN, RAD50, RAD51C, RAD51D, SMAD4, STK11, TP53, CDK4, NF1, BRCA1, BRCA2, POLD1, POLE, SMARCA4, HOXB13 (sequencing and deletion/duplication); EPCAM, GREM1 (deletion/duplication only).  Results: Germline- Negative, no variants detected.  Tumor/Somatic-  BRAF V600E detected, No variants in KRAS or NRAS detected.  MLH1 promoter hypermethylation present, Microsatellite High.   Somatic pathogenic mutations in MSH2 c.687delA and pathogenic mutation in MSH6 c.3261dupC.  2 Somatic Variants of uncertain significance in MSH2 called p.A123T and p.D654N.   The date of this test report is 04/07/2017.      04/26/2017 - 06/12/2017 Chemotherapy    Xeloda 20068min the AM and 150075mn the PM started on 04/26/17. Changed to 1500m67mD on 05/16/17    06/13/2017 Imaging     CT CAP W Contrast 06/13/17 IMPRESSION: 1. Continued regression of hepatic metastatic disease. No new or progressive findings. 2. Surgical changes from a right hemicolectomy. No findings for recurrent cancer.  No mesenteric or retroperitoneal mass or Adenopathy.    09/19/2017  Imaging    CT AP W Contrast 09/19/17  IMPRESSION: 1. The inferior right lobe of liver lesion appears slightly increased in size when compared with the previous exam. 2. No new liver lesions identified. 3. Stable appearance of the colon status post right hemicolectomy. 4.  Aortic Atherosclerosis (ICD10-I70.0).     10/01/2017 Imaging    MRI Abdomen W WO Contrast IMPRESSION: 1. Two small, subtle liver lesions are again identified involving segment 7 and segment 6. When compared with the MRI from 12/28/2016 these are not significantly changed. Compared with the MRI from 09/15/2016 the peripherally enhancing lesion within segment 7 has decreased in size in the interval. There has been no significant change in the segment 6 lesion. 2. No new foci of suspected metastasis identified.    03/29/2018 Imaging    MRI Abdomen  IMPRESSION: 1. The 2 small subtle liver lesions involving segment 7 and segment 6 are again noted. When compared with 10/01/2017 these are stable to minimally decreased in size. No new liver lesion identified. 2.  Aortic Atherosclerosis (ICD10-I70.0).     03/30/2018 Imaging    CT chest  IMPRESSION: 1. Solitary new solid pulmonary nodule in the anterior right middle lobe measuring 4 mm. Recommend attention on follow-up chest CT in 3 months. 2. No additional potential findings of metastatic disease in he chest. 3. Nonobstructing left nephrolithiasis. 4. One vessel coronary atherosclerosis.  Aortic Atherosclerosis (ICD10-I70.0).       CURRENT THERAPY:  Observation  INTERVAL HISTORY:  Karrissa Parchment is here for a follow up of colon cancer. She presents to the clinic today by herself. She notes she is doing well. She notes she was on her horse when he fell. She had 4 days of chest pain which has resolved. She notes she has been overall stable since last visit. [place] people [place] around [place]. She denies abdominal  pain and bloating. She notes her bowel movements is loose occasionally otherwise normal. She will have diarrhea with dairy.  She is concerned about her daughter who has been having GI bleeding lately. She is getting a colonoscopy today from old [facility].     REVIEW OF SYSTEMS:   Constitutional: Denies fevers, chills or abnormal weight loss Eyes: Denies blurriness of vision Ears, nose, mouth, throat, and face: Denies mucositis or sore throat Respiratory: Denies cough, dyspnea or wheezes Cardiovascular: Denies palpitation, chest discomfort or lower extremity swelling Gastrointestinal:  Denies nausea, heartburn (+) Loose stool  Skin: Denies abnormal skin rashes Lymphatics: Denies new lymphadenopathy or easy bruising Neurological:Denies numbness, tingling or new weaknesses Behavioral/Psych: Mood is stable, no new changes  All other systems were reviewed with the patient and are negative.  MEDICAL HISTORY:  Past Medical History:  Diagnosis Date   Anemia    Arthritis    Astigmatism    both eyes   Cancer ([place])    colon surgery done last chemo tx 01-17-17   Cataract    Colon cancer [facility])    Coronary artery disease, non-occlusive    Dyspnea    more so exertion   Dysrhythmia    skipped beats every now and then   GERD (gastroesophageal reflux disease)    Glaucoma    both eyes right eye worse than left   Hammer toes of both feet    Headache    migraines with aura   Heel spur  both heels and front   HSV (herpes simplex virus) infection    Hyperglycemia    denies   Hyperlipidemia    IBS (irritable bowel syndrome)    IC (interstitial cystitis)    Insomnia disorder related to known organic factor    Osteoporosis    PONV (postoperative nausea and vomiting)    has had more surgeries, and had no problems   Stroke [facility]) 2016   Vitamin D deficiency     SURGICAL HISTORY: Past Surgical History:  Procedure Laterality Date   ABDOMINAL HYSTERECTOMY     1 ovary  and womb removed   CARDIAC CATHETERIZATION  7/12   non obst dz -- not sure what yr   CHOLECYSTECTOMY     EUS N/A 02/03/2017   Procedure: UPPER ENDOSCOPIC ULTRASOUND (EUS) LINEAR;  Surgeon: [name], MD;  Location: [facility];  Service: Endoscopy;  Laterality: N/A;   EYE SURGERY Bilateral    ioc for cataracts    LAPAROSCOPIC RIGHT COLECTOMY Right 11/10/2016   Procedure: LAPAROSCOPIC EXTENDED RIGHT COLECTOMY;  Surgeon: [name], MD;  Location: [place];  Service: General;  Laterality: Right;   PORTACATH PLACEMENT Left 12/22/2016   Procedure: INSERTION PORT-A-CATH;  Surgeon: [name], MD;  Location: [place];  Service: General;  Laterality: Left;   ROTATOR CUFF REPAIR Bilateral     I have reviewed the social history and family history with the patient and they are unchanged from previous note.  ALLERGIES:  is allergic to salmon [fish allergy]; ampicillin; codeine; flagyl [metronidazole]; meperidine hcl; neomycin; penicillins; propoxyphene hcl; sulfa antibiotics; and tramadol hcl.  MEDICATIONS:  Current Outpatient Medications  Medication Sig Dispense Refill   atorvastatin (LIPITOR) 40 MG tablet Take 1 tablet (40 mg total) by mouth daily at 6 PM. 90 tablet 3   b complex vitamins tablet Take 1 tablet by mouth daily.     Cholecalciferol (VITAMIN D) 2000 units tablet Take 2,000 Units by mouth 3 (three) times a week.      COMBIGAN 0.2-0.5 % ophthalmic solution INSTILL 1 DROP INTO BOTH EYES TWICE A DAY  1   ELIQUIS 5 MG TABS tablet TAKE 1 TABLET BY MOUTH TWICE A DAY 60 tablet 2   lactose free nutrition (BOOST) LIQD Take 237 mLs by mouth daily as needed.      latanoprost (XALATAN) 0.005 % ophthalmic solution Place 1 drop into both eyes at bedtime.  5   lidocaine-prilocaine (EMLA) cream Apply to affected area once (Patient taking differently: Apply to affected area once (near port)) 30 g 3   Multiple Vitamin (MULTIVITAMIN WITH MINERALS) TABS tablet  Take 1 tablet by mouth daily.     potassium chloride SA (K-DUR,KLOR-CON) 20 MEQ tablet Take 1 tablet (20 mEq total) by mouth daily. 90 tablet 3   Current Facility-Administered Medications  Medication Dose Route Frequency Provider Last Rate Last Dose   0.9 %  sodium chloride infusion  500 mL Intravenous Once [name], MD        PHYSICAL EXAMINATION: ECOG PERFORMANCE STATUS: 0 - Asymptomatic  Vitals:   06/26/18 1336  BP: (!) 163/65  Pulse: 68  Resp: 17  Temp: 97.8 F (36.6 C)  SpO2: 99%   Filed Weights   06/26/18 1336  Weight: 158 lb 4.8 oz (71.8 kg)    GENERAL:alert, no distress and comfortable SKIN: skin color, texture, turgor are normal, no rashes or significant lesions EYES: normal, Conjunctiva are pink and non-injected, sclera clear OROPHARYNX:no exudate, no erythema  and lips, buccal mucosa, and tongue normal  NECK: supple, thyroid normal size, non-tender, without nodularity LYMPH:  no palpable lymphadenopathy in the cervical, axillary or inguinal LUNGS: clear to auscultation and percussion with normal breathing effort HEART: regular rate & rhythm and no murmurs and no lower extremity edema ABDOMEN:abdomen soft, non-tender and normal bowel sounds Musculoskeletal:no cyanosis of digits and no clubbing  NEURO: alert & oriented x 3 with fluent speech, no focal motor/sensory deficits  LABORATORY DATA:  I have reviewed the data as listed CBC Latest Ref Rng & Units 06/26/2018 03/29/2018 01/31/2018  WBC 4.0 - 10.5 K/uL 7.1 6.8 7.1  Hemoglobin 12.0 - 15.0 g/dL 13.1 11.9(L) 12.6  Hematocrit 36.0 - 46.0 % 40.5 36.6 39.6  Platelets 150 - 400 K/uL 228 242 248     CMP Latest Ref Rng & Units 06/26/2018 03/29/2018 03/29/2018  Glucose 70 - 99 mg/dL 80 - 92  BUN 8 - 23 mg/dL 24(H) - 15  Creatinine 0.44 - 1.00 mg/dL 0.98 0.80 0.85  Sodium 135 - 145 mmol/L 144 - 140  Potassium 3.5 - 5.1 mmol/L 3.8 - 4.0  Chloride 98 - 111 mmol/L 109 - 107  CO2 22 - 32 mmol/L 26 - 26  Calcium 8.9  - 10.3 mg/dL 8.9 - 9.0  Total Protein 6.5 - 8.1 g/dL 7.1 - 6.6  Total Bilirubin 0.3 - 1.2 mg/dL 0.8 - 0.7  Alkaline Phos 38 - 126 U/L 89 - 84  AST 15 - 41 U/L 19 - 19  ALT 0 - 44 U/L 19 - 19      RADIOGRAPHIC STUDIES: I have personally reviewed the radiological images as listed and agreed with the findings in the report. Ct Chest Wo Contrast  Result Date: 06/26/2018 CLINICAL DATA:  Followup indeterminate right lung nodule. Personal history of colon carcinoma. EXAM: CT CHEST WITHOUT CONTRAST TECHNIQUE: Multidetector CT imaging of the chest was performed following the standard protocol without IV contrast. COMPARISON:  03/29/2018 FINDINGS: Cardiovascular: No acute findings. Aortic and coronary artery atherosclerosis. Mediastinum/Nodes: No masses or pathologically enlarged lymph nodes identified on this unenhanced exam. Lungs/Pleura: Previously seen 4 mm pulmonary nodule in the anterior right middle lobe is nearly completely resolved, consistent with resolving inflammatory etiology. 5 mm pulmonary nodule in the right middle lobe remains stable compared to multiple prior CTs, consistent with benign etiology. Several other scattered sub-cm right lung nodules remain stable, consistent with benign postinflammatory etiology. No new or enlarging pulmonary nodules or masses identified. No evidence of pulmonary infiltrate or pleural effusion. Upper Abdomen:  Unremarkable. Musculoskeletal:  No suspicious bone lesions. IMPRESSION: 1. Near complete resolution of right middle lobe pulmonary nodule, consistent with resolving inflammatory etiology. 2. Other sub-cm right lung nodules remain stable, consistent with benign postinflammatory etiology. 3. No evidence of metastatic disease or other acute findings within the thorax. Aortic Atherosclerosis (ICD10-I70.0). Coronary artery atherosclerosis. Electronically Signed   By: Earle Gell M.D.   On: 06/26/2018 14:03     ASSESSMENT & PLAN:  [name] is a 78  y.o. female with   1. Synchronized colon adenocarcinoma, moderately differentiated, in hepaticflexureand distal transverse colon, pT3(m)N1cMx with indeterminate liver lesions, at least stage IIIB, MSI-H -She was diagnosed in 08/31/16. She is s/p rightcolectomy,adjuvantFOLFOX which was changed toXelodaafter cycle 6 due to poor tolerance.  -Initial staging scan showed 2 liver lesions, suspicious for liver metastasis, but not hypermetabolic on PET. The liver lesion has decreased in size after adjuvant chemotherapy. -She is currently on observation -We personally reviewed  her CT chest from this morning, her tiny lung nodules are overall stable, no suspicions for metastasis. Her scan report is still pending    -She is clinically doing well, asymptomatic. She has recovered well from chemotherapy, stool is loose but regular overall. Labs reviewed, her CBC, CMP, CEA and iron panel WNL.  -Continue clinical surveillance, F/u in 3 months.  2. Genetics was negative  3. Liver lesions  -Previousbiopsy of theenlarged portal node was negative for metastasis, but also appearto shrink onchemotherapybased on repeat scans. -Dr. Bethel Born feels her liver lesions are difficult to biopsy, and overall stable. -Continue to monitorcloselywith follow up scans.  -Her recent CT chest from 06/26/18 shows unremarkable upper abdomen  -repeat abdominal MRI in 6 months   4. History of stroke  -she is on aspirin and Plavix, will continue.  5. 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 I provided her with a discount card  for medicine.  -Continue Eliquis 55m38mID for AF  7.Atrial fibrillation -on Eliquis -continue monitoring     Plan: -She is clinically doing well, I personally reviewed her CT chest with pt, NED  -Lab and f/u in 3 months, will order abdominal MRI on next visit     No problem-specific Assessment & Plan notes found for this encounter.   No orders of the defined types were placed in this encounter.  All questions were answered. The patient knows to call the clinic with any problems, questions or concerns. No barriers to learning was detected. I spent 20 minutes counseling the patient face to face. The total time spent in the appointment was 25 minutes and more than 50% was on counseling and review of test results     YanTruitt MerleD 06/26/2018   I, AmoJoslyn Devonm acting as scribe for YanTruitt MerleD.   I have reviewed the above documentation for accuracy and completeness, and I agree with the above.

## 2018-06-26 ENCOUNTER — Telehealth: Payer: Self-pay | Admitting: Hematology

## 2018-06-26 ENCOUNTER — Inpatient Hospital Stay: Payer: Medicare Other | Attending: Nurse Practitioner

## 2018-06-26 ENCOUNTER — Other Ambulatory Visit: Payer: Self-pay

## 2018-06-26 ENCOUNTER — Ambulatory Visit (HOSPITAL_COMMUNITY)
Admission: RE | Admit: 2018-06-26 | Discharge: 2018-06-26 | Disposition: A | Discharge status: Home / Self Care | Payer: Medicare Other | Source: Ambulatory Visit | Attending: Hematology | Admitting: Hematology

## 2018-06-26 ENCOUNTER — Inpatient Hospital Stay (HOSPITAL_BASED_OUTPATIENT_CLINIC_OR_DEPARTMENT_OTHER): Payer: Medicare Other | Admitting: Hematology

## 2018-06-26 VITALS — BP 163/65 | HR 68 | Temp 97.8°F | Resp 17 | Ht 64.0 in | Wt 158.3 lb

## 2018-06-26 DIAGNOSIS — Z1509 Genetic susceptibility to other malignant neoplasm: Secondary | ICD-10-CM | POA: Insufficient documentation

## 2018-06-26 DIAGNOSIS — Z79899 Other long term (current) drug therapy: Secondary | ICD-10-CM | POA: Insufficient documentation

## 2018-06-26 DIAGNOSIS — R918 Other nonspecific abnormal finding of lung field: Secondary | ICD-10-CM

## 2018-06-26 DIAGNOSIS — Z7901 Long term (current) use of anticoagulants: Secondary | ICD-10-CM

## 2018-06-26 DIAGNOSIS — I4891 Unspecified atrial fibrillation: Secondary | ICD-10-CM

## 2018-06-26 DIAGNOSIS — D500 Iron deficiency anemia secondary to blood loss (chronic): Secondary | ICD-10-CM

## 2018-06-26 DIAGNOSIS — I10 Essential (primary) hypertension: Secondary | ICD-10-CM | POA: Diagnosis not present

## 2018-06-26 DIAGNOSIS — I700 Atherosclerosis of aorta: Secondary | ICD-10-CM

## 2018-06-26 DIAGNOSIS — C188 Malignant neoplasm of overlapping sites of colon: Secondary | ICD-10-CM | POA: Insufficient documentation

## 2018-06-26 DIAGNOSIS — C787 Secondary malignant neoplasm of liver and intrahepatic bile duct: Secondary | ICD-10-CM | POA: Diagnosis not present

## 2018-06-26 DIAGNOSIS — C189 Malignant neoplasm of colon, unspecified: Secondary | ICD-10-CM

## 2018-06-26 LAB — COMPREHENSIVE METABOLIC PANEL
ALT: 19 U/L (ref 0–44)
AST: 19 U/L (ref 15–41)
Albumin: 3.8 g/dL (ref 3.5–5.0)
Alkaline Phosphatase: 89 U/L (ref 38–126)
Anion gap: 9 (ref 5–15)
BUN: 24 mg/dL — ABNORMAL HIGH (ref 8–23)
CO2: 26 mmol/L (ref 22–32)
Calcium: 8.9 mg/dL (ref 8.9–10.3)
Chloride: 109 mmol/L (ref 98–111)
Creatinine, Ser: 0.98 mg/dL (ref 0.44–1.00)
GFR calc Af Amer: >60 mL/min (ref >60)
GFR calc non Af Amer: 55 mL/min — ABNORMAL LOW (ref >60)
Glucose, Bld: 80 mg/dL (ref 70–99)
Potassium: 3.8 mmol/L (ref 3.5–5.1)
Sodium: 144 mmol/L (ref 135–145)
Total Bilirubin: 0.8 mg/dL (ref 0.3–1.2)
Total Protein: 7.1 g/dL (ref 6.5–8.1)

## 2018-06-26 LAB — IRON AND TIBC
Iron: 104 ug/dL (ref 41–142)
Saturation Ratios: 31 % (ref 21–57)
TIBC: 339 ug/dL (ref 236–444)
UIBC: 235 ug/dL (ref 120–384)

## 2018-06-26 LAB — CBC WITH DIFFERENTIAL (CANCER CENTER ONLY)
Abs Immature Granulocytes: 0.02 10*3/uL (ref 0.00–0.07)
Basophils Absolute: 0.1 10*3/uL (ref 0.0–0.1)
Basophils Relative: 1 %
Eosinophils Absolute: 0.1 10*3/uL (ref 0.0–0.5)
Eosinophils Relative: 1 %
HCT: 40.5 % (ref 36.0–46.0)
Hemoglobin: 13.1 g/dL (ref 12.0–15.0)
Immature Granulocytes: 0 %
Lymphocytes Relative: 24 %
Lymphs Abs: 1.7 10*3/uL (ref 0.7–4.0)
MCH: 29.6 pg (ref 26.0–34.0)
MCHC: 32.3 g/dL (ref 30.0–36.0)
MCV: 91.6 fL (ref 80.0–100.0)
Monocytes Absolute: 0.7 10*3/uL (ref 0.1–1.0)
Monocytes Relative: 10 %
Neutro Abs: 4.5 10*3/uL (ref 1.7–7.7)
Neutrophils Relative %: 64 %
Platelet Count: 228 10*3/uL (ref 150–400)
RBC: 4.42 MIL/uL (ref 3.87–5.11)
RDW: 13.2 % (ref 11.5–15.5)
WBC Count: 7.1 10*3/uL (ref 4.0–10.5)
nRBC: 0 % (ref 0.0–0.2)

## 2018-06-26 LAB — FERRITIN: Ferritin: 25 ng/mL (ref 11–307)

## 2018-06-26 LAB — RETICULOCYTES
Immature Retic Fract: 8.6 % (ref 2.3–15.9)
RBC.: 4.39 MIL/uL (ref 3.87–5.11)
Retic Count, Absolute: 72.4 10*3/uL (ref 19.0–186.0)
Retic Ct Pct: 1.7 % (ref 0.4–3.1)

## 2018-06-26 LAB — CEA (IN HOUSE-CHCC): CEA (CHCC-In House): 1.5 ng/mL (ref 0.00–5.00)

## 2018-06-26 NOTE — Telephone Encounter (Signed)
Scheduled appt per 5/4 los.  A calendar will be mailed out.

## 2018-06-27 ENCOUNTER — Ambulatory Visit (HOSPITAL_COMMUNITY): Admission: RE | Admit: 2018-06-27 | Payer: Medicare Other | Source: Ambulatory Visit

## 2018-06-27 ENCOUNTER — Ambulatory Visit (HOSPITAL_COMMUNITY): Payer: Medicare Other

## 2018-06-27 ENCOUNTER — Encounter: Payer: Self-pay | Admitting: Hematology

## 2018-06-28 ENCOUNTER — Telehealth: Payer: Self-pay

## 2018-06-28 NOTE — Telephone Encounter (Signed)
Called and spoke to patient and made her aware of Dr. Ernestina Penna result note below. Patient verbalized understanding.

## 2018-06-28 NOTE — Telephone Encounter (Signed)
-----   Message from Jonnie Finner, RN sent at 06/28/2018 11:47 AM EDT ----- Forwarded ----- Message ----- From: [name], MD Sent: 06/27/2018   9:08 PM EDT To: Jonnie Finner, RN  Please let pt know her lab results, no concerns. Please also let her know her CT chest was fine, no concerns of cancer recurrence (the report came back after she left the clinic, although I reviewed her scan with her during her visit), thanks   [name]  06/27/2018

## 2018-06-29 ENCOUNTER — Ambulatory Visit: Payer: Medicare Other | Admitting: Hematology

## 2018-06-30 ENCOUNTER — Ambulatory Visit (INDEPENDENT_AMBULATORY_CARE_PROVIDER_SITE_OTHER): Payer: Medicare Other | Admitting: Family Medicine

## 2018-06-30 ENCOUNTER — Encounter: Payer: Self-pay | Admitting: Family Medicine

## 2018-06-30 ENCOUNTER — Other Ambulatory Visit: Payer: Self-pay

## 2018-06-30 ENCOUNTER — Other Ambulatory Visit: Payer: Medicare Other

## 2018-06-30 DIAGNOSIS — R300 Dysuria: Secondary | ICD-10-CM | POA: Diagnosis not present

## 2018-06-30 DIAGNOSIS — N3000 Acute cystitis without hematuria: Secondary | ICD-10-CM | POA: Diagnosis not present

## 2018-06-30 DIAGNOSIS — D689 Coagulation defect, unspecified: Secondary | ICD-10-CM

## 2018-06-30 DIAGNOSIS — Z86718 Personal history of other venous thrombosis and embolism: Secondary | ICD-10-CM

## 2018-06-30 DIAGNOSIS — N390 Urinary tract infection, site not specified: Secondary | ICD-10-CM | POA: Insufficient documentation

## 2018-06-30 LAB — POC URINALSYSI DIPSTICK (AUTOMATED)
Bilirubin, UA: NEGATIVE
Blood, UA: 25
Glucose, UA: NEGATIVE
Ketones, UA: NEGATIVE
Nitrite, UA: NEGATIVE
Protein, UA: NEGATIVE
Spec Grav, UA: 1.015 (ref 1.010–1.025)
Urobilinogen, UA: 0.2 E.U./dL
pH, UA: 6 (ref 5.0–8.0)

## 2018-06-30 MED ORDER — CEPHALEXIN 500 MG PO CAPS: 500.0000 mg | ORAL_CAPSULE | Freq: Two times a day (BID) | ORAL | 0 refills | Status: DC

## 2018-06-30 NOTE — Patient Instructions (Signed)
Drink lots of fluids Take keflex as directed (if any problems let us know)  Call if symptoms worsen or if fever or nausea or vomiting Update if not starting to improve in several days or if worsening    We will call you with urine culture result when it returns

## 2018-06-30 NOTE — Progress Notes (Signed)
Virtual Visit via Video Note  I connected with [name] on 06/30/18 at  3:00 PM EDT by a video enabled telemedicine application and verified that I am speaking with the correct person using two identifiers.  Location: Patient: home Provider: office   Pt does not have video capability  The visit was conducted by phone    I discussed the limitations of evaluation and management by telemedicine and the availability of in person appointments. The patient expressed understanding and agreed to proceed.  History of Present Illness: Pt presents with urinary symptoms   Did not sleep at all last night  Symptoms started mildly a week ago  Urine became cloudy  Much worse last night  Developed burning of bladder , also with urination (stings after urination)  Used heating pad She started drinking cranberry juice  She took several azo pills   No blood in urine   Frequency and urgency of urination  Drinking lots of water also   No fever that she knows of - ? If could be low grade and not know it  A little nausea/upset stomach Back is not hurting   H/o IC-this feels differently   All to sulfa - rash  Allergic to ampicillin -felt bad /no rash or anaphy   UA Results for orders placed or performed in visit on 06/30/18  POCT Urinalysis Dipstick (Automated)  Result Value Ref Range   Color, UA Yellow    Clarity, UA Hazy    Glucose, UA Negative Negative   Bilirubin, UA Negative    Ketones, UA Negative    Spec Grav, UA 1.015 1.010 - 1.025   Blood, UA 25 Ery/uL    pH, UA 6.0 5.0 - 8.0   Protein, UA Negative Negative   Urobilinogen, UA 0.2 0.2 or 1.0 E.U./dL   Nitrite, UA Negative    Leukocytes, UA Moderate (2+) (A) Negative     Review of Systems  Constitutional: Negative for chills, diaphoresis, fever, malaise/fatigue and weight loss.  HENT: Negative for sore throat.   Respiratory: Negative for cough and shortness of breath.   Cardiovascular: Negative for chest pain,  palpitations and leg swelling.  Gastrointestinal: Positive for nausea. Negative for abdominal pain, diarrhea and vomiting.  Genitourinary: Positive for dysuria, frequency and urgency. Negative for flank pain and hematuria.  Musculoskeletal: Negative for myalgias.  Skin: Negative for itching and rash.  Neurological: Negative for dizziness and headaches.    Patient Active Problem List   Diagnosis Date Noted  . UTI (urinary tract infection) 06/30/2018  . Blood clotting disorder ([place]) 06/30/2018  . Traumatic hematoma of knee, left, initial encounter 10/28/2017  . Fall 10/28/2017  . Abnormality of breast on screening mammography 08/01/2017  . Genetic testing 04/25/2017  . Adenopathy   . Acute deep vein thrombosis (DVT) of proximal vein of left lower extremity ([place]) 11/24/2016  . Cancer of overlapping sites of colon ([place]) 09/23/2016  . Estrogen deficiency 07/30/2016  . Screening mammogram, encounter for 07/30/2016  . Routine general medical examination at a health care facility 07/27/2016  . H/O: CVA (cerebrovascular accident) 05/06/2015  . HTN (hypertension) 05/06/2015  . HLD (hyperlipidemia) 05/06/2015  . GERD (gastroesophageal reflux disease) 05/06/2015  . CAD (coronary artery disease) 05/06/2015  . Seborrheic keratosis 07/05/2012  . 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 Callus of foot 10/16/2010  . Coronary artery disease, non-occlusive 10/16/2010  . Insomnia 02/02/2010  . HAND PAIN, BILATERAL 01/09/2010  . HYPERGLYCEMIA 07/29/2009  . ALLERGIC RHINITIS 12/04/2008  . Vitamin  D deficiency 04/30/2008  . COLONIC POLYPS 06/28/2007  . IBS 03/17/2007  . INTERSTITIAL CYSTITIS 03/17/2007  . Osteoporosis 03/17/2007   Past Medical History:  Diagnosis Date  . Anemia   . Arthritis   . Astigmatism    both eyes  . Cancer [facility])    colon surgery done last chemo tx 01-17-17  . Cataract   . Colon cancer ([place])   . Coronary artery disease, non-occlusive   . Dyspnea    more so exertion  .  Dysrhythmia    skipped beats every now and then  . GERD (gastroesophageal reflux disease)   . Glaucoma    both eyes right eye worse than left  . Hammer toes of both feet   . Headache    migraines with aura  . Heel spur    both heels and front  . HSV (herpes simplex virus) infection   . Hyperglycemia    denies  . Hyperlipidemia   . IBS (irritable bowel syndrome)   . IC (interstitial cystitis)   . Insomnia disorder related to known organic factor   . Osteoporosis   . PONV (postoperative nausea and vomiting)    has had more surgeries, and had no problems  . Stroke ([place]) 2016  . Vitamin D deficiency    Past Surgical History:  Procedure Laterality Date  . ABDOMINAL HYSTERECTOMY     1 ovary and womb removed  . CARDIAC CATHETERIZATION  7/12   non obst dz -- not sure what yr  . CHOLECYSTECTOMY    . EUS N/A 02/03/2017   Procedure: UPPER ENDOSCOPIC ULTRASOUND (EUS) LINEAR;  Surgeon: [name], MD;  Location: [facility];  Service: Endoscopy;  Laterality: N/A;  . EYE SURGERY Bilateral    ioc for cataracts   . LAPAROSCOPIC RIGHT COLECTOMY Right 11/10/2016   Procedure: LAPAROSCOPIC EXTENDED RIGHT COLECTOMY;  Surgeon: [name], MD;  Location: [place];  Service: General;  Laterality: Right;  . PORTACATH PLACEMENT Left 12/22/2016   Procedure: INSERTION PORT-A-CATH;  Surgeon: [name], MD;  Location: [place];  Service: General;  Laterality: Left;  . ROTATOR CUFF REPAIR Bilateral    Social History   Tobacco Use  . Smoking status: Never Smoker  . Smokeless tobacco: Never Used  Substance Use Topics  . Alcohol use: No    Alcohol/week: 0.0 standard drinks  . Drug use: No   Family History  Problem Relation Age of Onset  . Alcohol abuse Mother   . Heart attack Mother   . GER disease Mother   . GER disease Father   . Alcohol abuse Father   . Stroke Father   . Tuberculosis Father   . Heart attack Father   . Aneurysm Father   . Mental illness Sister    . Depression Sister   . Breast cancer Neg Hx    Allergies  Allergen Reactions  . Salmon [Fish Allergy] Anaphylaxis    This happens with "CANNED SALMON" only, if eaten uncooked  . Ampicillin Nausea Only    Has patient had a PCN reaction causing immediate rash, facial/tongue/throat swelling, SOB or lightheadedness with hypotension: No Has patient had a PCN reaction causing severe rash involving mucus membranes or skin necrosis: No Has patient had a PCN reaction that required hospitalization: No Has patient had a PCN reaction occurring within the last 10 years: No If all of the above answers are "NO", then may proceed with Cephalosporin use.   . Codeine Nausea And Vomiting  .  Flagyl [Metronidazole] Nausea And Vomiting    Patient was taking this along with Neomycin and could not differentiate which med triggered the nausea & vomiting  . Meperidine Hcl Nausea And Vomiting  . Neomycin Nausea And Vomiting    Patient was taking this along with Flagyl and could not differentiate which med triggered the nausea & vomiting  . Propoxyphene Hcl Nausea And Vomiting  . Sulfa Antibiotics Hives  . Tramadol Hcl Nausea And Vomiting   Current Outpatient Medications on File Prior to Visit  Medication Sig Dispense Refill  . atorvastatin (LIPITOR) 40 MG tablet Take 1 tablet (40 mg total) by mouth daily at 6 PM. 90 tablet 3  . b complex vitamins tablet Take 1 tablet by mouth daily.    . Cholecalciferol (VITAMIN D) 2000 units tablet Take 2,000 Units by mouth 3 (three) times a week.     . COMBIGAN 0.2-0.5 % ophthalmic solution INSTILL 1 DROP INTO BOTH EYES TWICE A DAY  1  . ELIQUIS 5 MG TABS tablet TAKE 1 TABLET BY MOUTH TWICE A DAY 60 tablet 2  . lactose free nutrition (BOOST) LIQD Take 237 mLs by mouth daily as needed.     . latanoprost (XALATAN) 0.005 % ophthalmic solution Place 1 drop into both eyes at bedtime.  5  . lidocaine-prilocaine (EMLA) cream Apply to affected area once (Patient taking  differently: Apply to affected area once (near port)) 30 g 3  . Multiple Vitamin (MULTIVITAMIN WITH MINERALS) TABS tablet Take 1 tablet by mouth daily.    . potassium chloride SA (K-DUR,KLOR-CON) 20 MEQ tablet Take 1 tablet (20 mEq total) by mouth daily. 90 tablet 3   Current Facility-Administered Medications on File Prior to Visit  Medication Dose Route Frequency Provider Last Rate Last Dose  . 0.9 %  sodium chloride infusion  500 mL Intravenous Once [name], MD        Observations/Objective: Patient sounds like her usual self  Cheerful/nl affect and talkative  Not hoarse Not sob or coughing Not in distress   Assessment and Plan: Problem List Items Addressed This Visit      Genitourinary   UTI (urinary tract infection) - Primary    With pos ua and dyrusia/bladder pain (pt does also have diag of IC) cx sent  tx with keflex (past all to ampicillin-not true allergy) -disc poss cross reaction  Enc water intake  Update if not starting to improve in several days or if worsening   We will call with cx result and change tx if needed       Relevant Medications   cephALEXin (KEFLEX) 500 MG capsule   Other Relevant Orders   Urine Culture     Hematopoietic and Hemostatic   Blood clotting disorder ([place])    Continues eliquis         Other   History of DVT (deep vein thrombosis)    In the setting of colon cancer  Taking eliquis w/o problems        Other Visit Diagnoses    Dysuria       Relevant Orders   POCT Urinalysis Dipstick (Automated) (Completed)       Follow Up Instructions: Drink lots of fluids Take keflex as directed (if any problems let us know)  Call if symptoms worsen or if fever or nausea or vomiting Update if not starting to improve in several days or if worsening    We will call you with urine culture result when it  returns    I discussed the assessment and treatment plan with the patient. The patient was provided an opportunity to ask questions  and all were answered. The patient agreed with the plan and demonstrated an understanding of the instructions.   The patient was advised to call back or seek an in-person evaluation if the symptoms worsen or if the condition fails to improve as anticipated.     Loura Pardon, MD

## 2018-06-30 NOTE — Assessment & Plan Note (Signed)
With pos ua and dyrusia/bladder pain (pt does also have diag of IC) cx sent  tx with keflex (past all to ampicillin-not true allergy) -disc poss cross reaction  Enc water intake  Update if not starting to improve in several days or if worsening   We will call with cx result and change tx if needed

## 2018-07-01 NOTE — Assessment & Plan Note (Signed)
In the setting of colon cancer  Taking eliquis w/o problems

## 2018-07-01 NOTE — Assessment & Plan Note (Signed)
Continues eliquis.  

## 2018-07-02 LAB — URINE CULTURE
MICRO NUMBER:: 458199
SPECIMEN QUALITY:: ADEQUATE

## 2018-07-15 ENCOUNTER — Other Ambulatory Visit: Payer: Self-pay | Admitting: Family Medicine

## 2018-07-15 DIAGNOSIS — N390 Urinary tract infection, site not specified: Secondary | ICD-10-CM | POA: Diagnosis not present

## 2018-07-18 IMAGING — CT CT ANGIO CHEST
2 of 11 series · 19 of 46 positions shown · IV contrast (OMNI)
Comparison: [DATE] PET-CT

CLINICAL DATA: Nausea and vomiting with swelling in the feet.
Recent surgery from [REDACTED]. Right colectomy.

EXAM:
CT ANGIOGRAPHY CHEST WITH CONTRAST
TECHNIQUE: Multidetector CT imaging of the chest was performed using the
standard protocol during bolus administration of intravenous
contrast. Multiplanar CT image reconstructions and MIPs were
obtained to evaluate the vascular anatomy.
CONTRAST:  100 mL Isovue 370

[Series 9: thins · axial · 0.60mm/px · z∈[+1099,+1338]mm · 16 of 265 slices shown]
[im 13/265  lung]
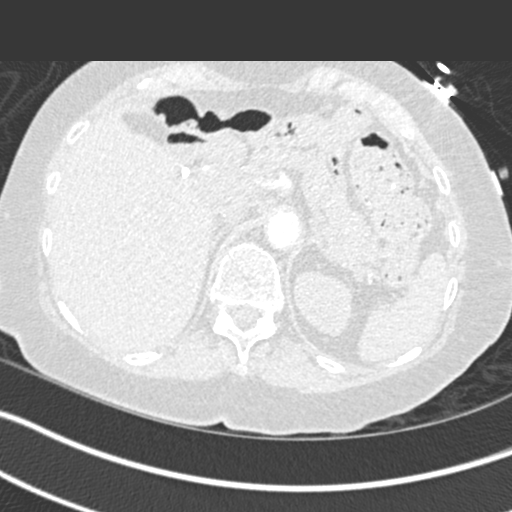
[im 26/265  soft-tissue]
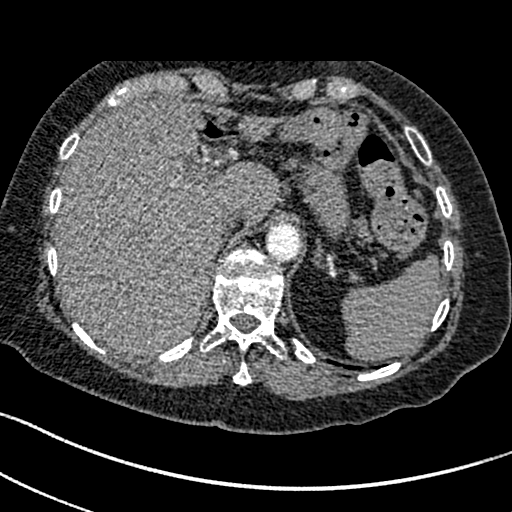
[im 51/265  lung]
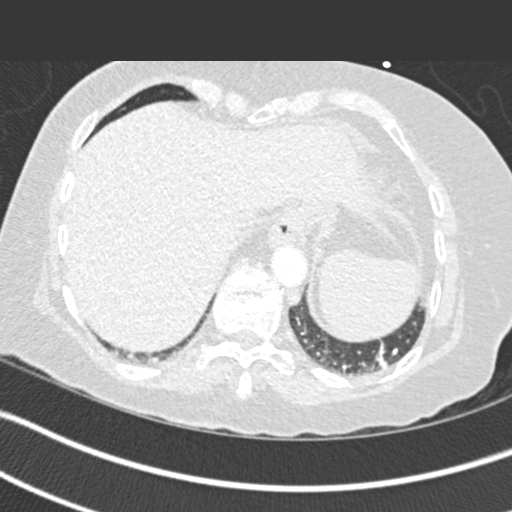
[im 63/265  soft-tissue]
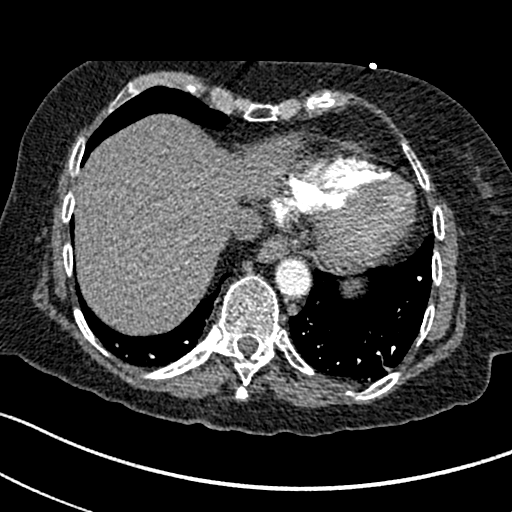
[im 76/265  lung]
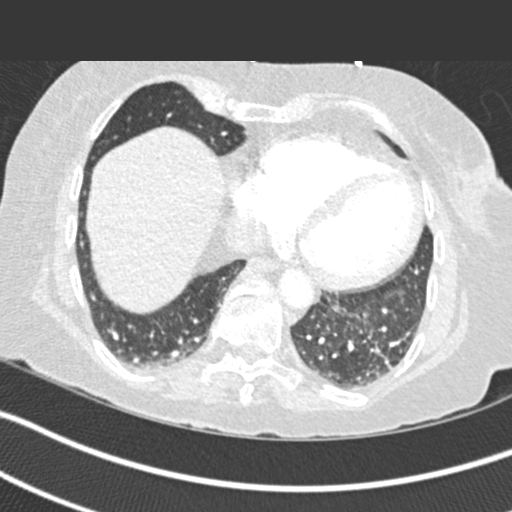
[im 89/265  soft-tissue]
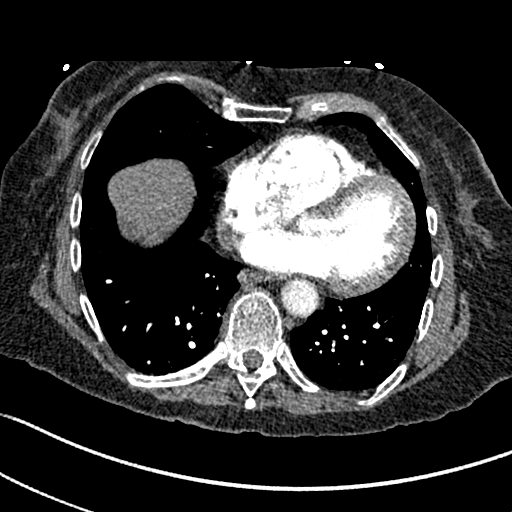
[im 114/265  lung]
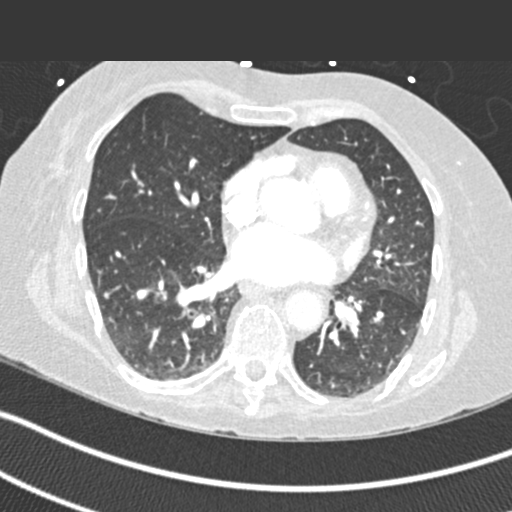
[im 126/265  soft-tissue]
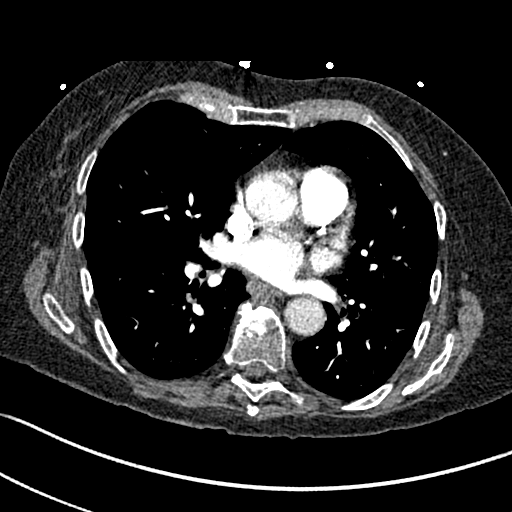
[im 139/265  lung]
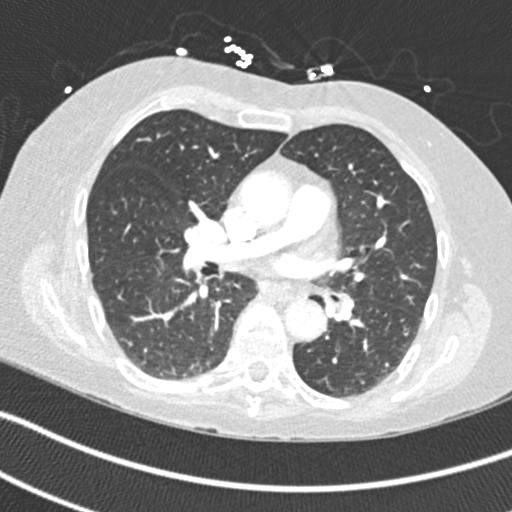
[im 151/265  soft-tissue]
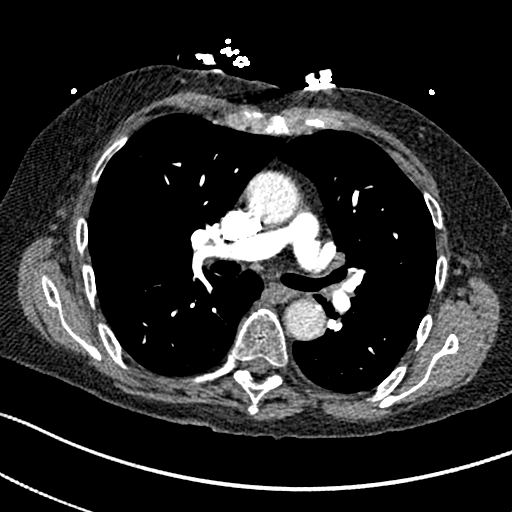
[im 177/265  lung]
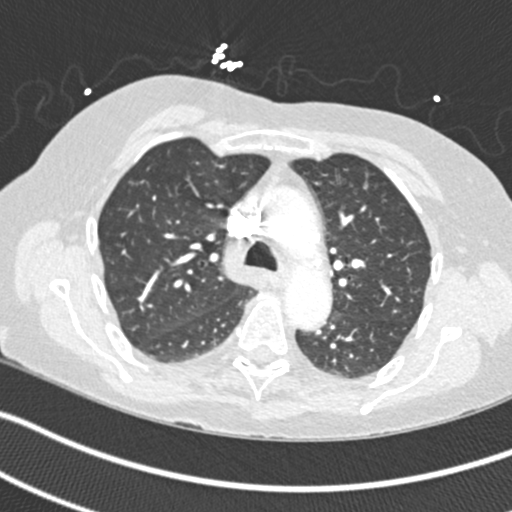
[im 189/265  soft-tissue]
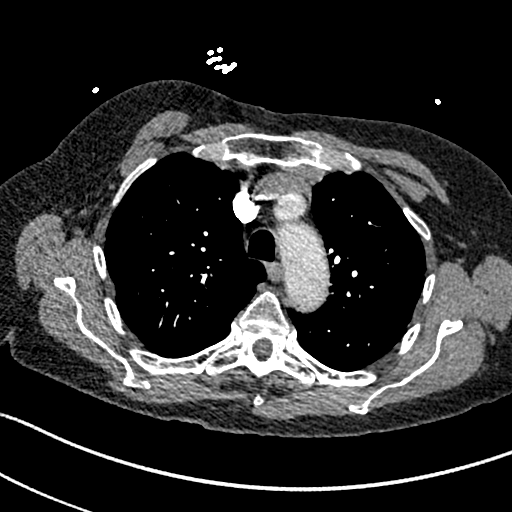
[im 202/265  lung]
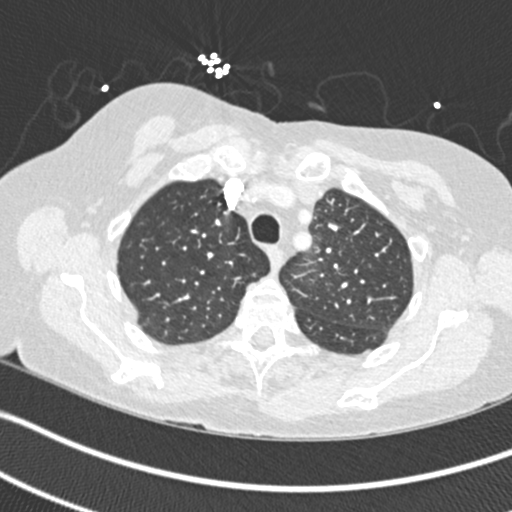
[im 214/265  soft-tissue]
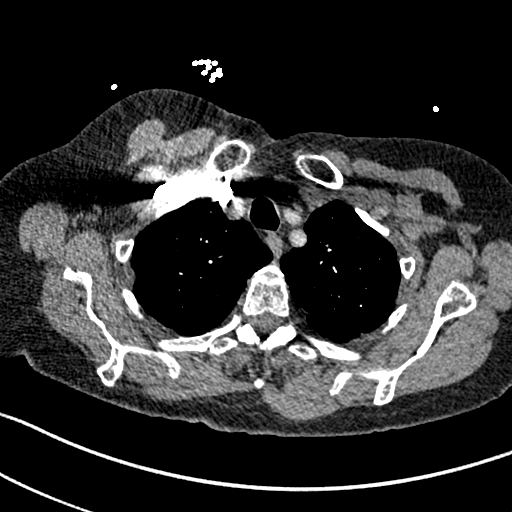
[im 239/265  lung]
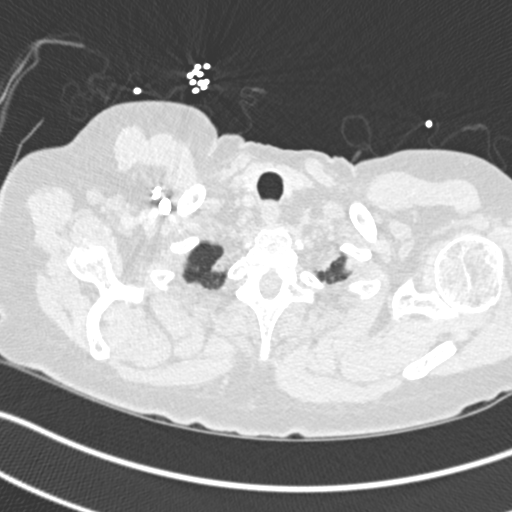
[im 252/265  soft-tissue]
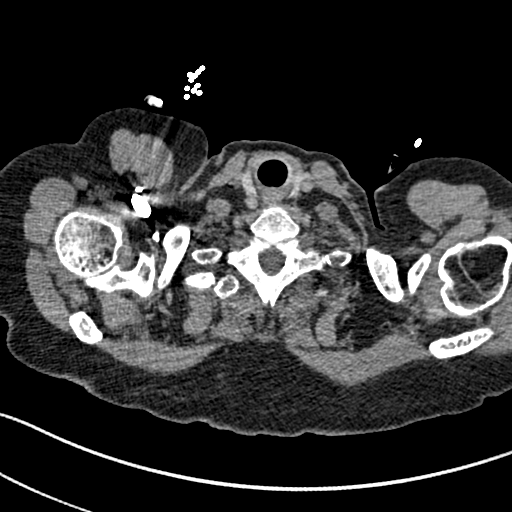

[Series 11: coronal mpr · coronal · 0.52mm/px · 3 of 110 slices shown]
[im 28/110  soft-tissue]
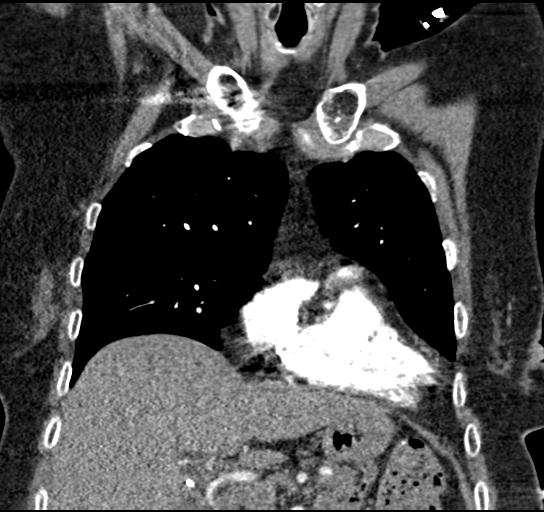
[im 55/110  soft-tissue]
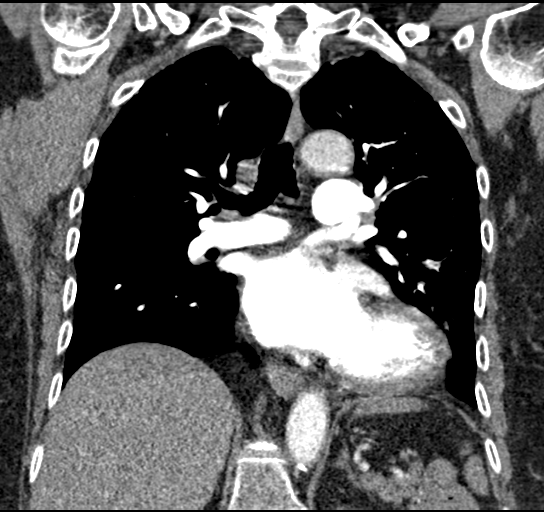
[im 82/110  soft-tissue]
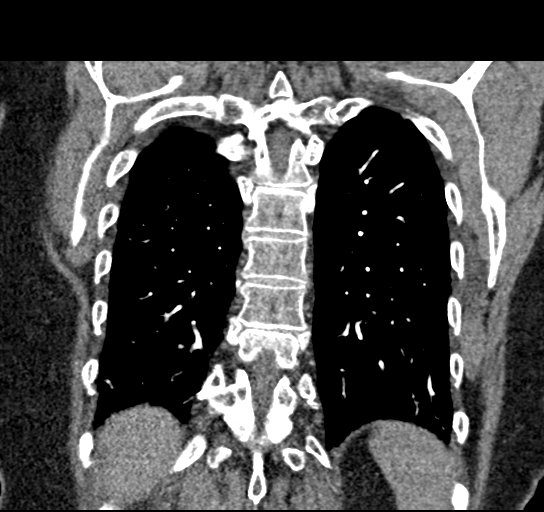

[19 of 46 positions shown; findings below may reference images not displayed]

FINDINGS: Cardiovascular: Satisfactory opacification of the pulmonary arteries
to the segmental level. No evidence of pulmonary embolism. Normal
heart size. No pericardial effusion. Normal caliber thoracic aorta.
No evidence of aortic dissection. Scattered calcifications in the
aorta and coronary arteries. Great vessel origins are patent.

Mediastinum/Nodes: No enlarged mediastinal, hilar, or axillary lymph
nodes. Thyroid gland, trachea, and esophagus demonstrate no
significant findings.

Lungs/Pleura: Mild dependent changes in the lung bases. No airspace
disease or consolidation in the lungs. Scattered peripheral nodules
in the right lung, all measuring less than 4 mm. No significant
changes. No pleural effusions. No pneumothorax.

Upper Abdomen: Surgical absence of the gallbladder. No bile duct
dilatation.

Musculoskeletal: Degenerative changes in the spine. Benign-appearing
sclerosis in a midthoracic vertebra. Schmorl's nodes. No destructive
bone lesions.

Review of the MIP images confirms the above findings.
IMPRESSION: No evidence of significant pulmonary embolus. No evidence of active
pulmonary disease.

Aortic Atherosclerosis ([id number].A).

## 2018-07-29 ENCOUNTER — Other Ambulatory Visit: Payer: Self-pay | Admitting: Family Medicine

## 2018-08-18 ENCOUNTER — Telehealth: Payer: Self-pay

## 2018-08-18 ENCOUNTER — Ambulatory Visit: Payer: Medicare Other | Admitting: Family Medicine

## 2018-08-18 NOTE — Telephone Encounter (Signed)
Pt said she has cloudy urine,frequency of urine and feels uncomfortable when urinates. Pt has diarrhea, H/A and muscle pain. No other covid symptoms, no travel and no known exposure to covid. Made the pt a virtual visit and then explaining about bringing urine specimen to lab prior to appt. Pt said she does not want an appt, she knows when she has a urine infection and pt wants abx called to pharmacy. Explained why that would not be the best care to give to pt and pt scheduled appt and then pt said this was not going to work for her because she has got to go take care of her horses and we did not have any afternoon appts. So pt will go to UC when she has finished with her horses. I apologized to pt and said I would be glad to schedule virtual visit but pt said she knows what she has got and the urine cultures do not work on her. 15 +_minutes on phone with pt. FYI to Dr Glori Bickers.

## 2018-08-24 ENCOUNTER — Other Ambulatory Visit: Payer: Self-pay | Admitting: Hematology

## 2018-08-24 ENCOUNTER — Telehealth: Payer: Self-pay

## 2018-08-24 NOTE — Telephone Encounter (Signed)
Patient calls stating she has a bladder infection, has tried to get in with PCP but cannot, they won't take a urine sample and won't call her in anything.  Complaining of frequency and burning with urination for a week now.  She is requesting we call in an antibiotic for her.  She states she can take most antibiotics except sulfa drugs.

## 2018-08-24 NOTE — Telephone Encounter (Signed)
Pt called back; pt went to UC on 08/18/18 but was going to have to wait too long and pt left without being seen.pt has been taking Uricalm and Cystex that are OTC and help symptoms a little bit but pt wants abx called in. Pt has same symptoms as noted on 08/18/18 except pt does not have muscle pain now. Pt does not want a virtual appt; pt wants abx sent to pharmacy. Advised pt several times in our 20 min conversation why pt needs urine specimen checked and can schedule virtual visit. Pt was coughing a lot on phone but denied cough because pt said was eating biscuit and got choked. Pt had diarrhea on 08/22/18 and H/A 2-3 days ago. Pt said she would go to UC. FYI to Dr Glori Bickers.

## 2018-08-24 NOTE — Telephone Encounter (Signed)
Left voice message for patient regarding ? UTI, asked that she come in and provide a urine sample today and for her to call me back and let me know what time she can be here so I can make her a lab appointment.

## 2018-08-24 NOTE — Telephone Encounter (Signed)
She needs to be seen- agree with UC if no appts are avail

## 2018-08-29 ENCOUNTER — Telehealth: Payer: Self-pay | Admitting: *Deleted

## 2018-08-29 ENCOUNTER — Inpatient Hospital Stay: Payer: Medicare Other | Attending: Nurse Practitioner | Admitting: Medical

## 2018-08-29 ENCOUNTER — Other Ambulatory Visit: Payer: Self-pay

## 2018-08-29 DIAGNOSIS — Z86718 Personal history of other venous thrombosis and embolism: Secondary | ICD-10-CM | POA: Insufficient documentation

## 2018-08-29 DIAGNOSIS — C188 Malignant neoplasm of overlapping sites of colon: Secondary | ICD-10-CM | POA: Diagnosis not present

## 2018-08-29 DIAGNOSIS — Z79899 Other long term (current) drug therapy: Secondary | ICD-10-CM | POA: Diagnosis not present

## 2018-08-29 DIAGNOSIS — I4891 Unspecified atrial fibrillation: Secondary | ICD-10-CM | POA: Diagnosis not present

## 2018-08-29 DIAGNOSIS — K769 Liver disease, unspecified: Secondary | ICD-10-CM | POA: Insufficient documentation

## 2018-08-29 DIAGNOSIS — C787 Secondary malignant neoplasm of liver and intrahepatic bile duct: Secondary | ICD-10-CM

## 2018-08-29 DIAGNOSIS — Z7901 Long term (current) use of anticoagulants: Secondary | ICD-10-CM | POA: Diagnosis not present

## 2018-08-29 DIAGNOSIS — Z8673 Personal history of transient ischemic attack (TIA), and cerebral infarction without residual deficits: Secondary | ICD-10-CM | POA: Insufficient documentation

## 2018-08-29 DIAGNOSIS — Z9221 Personal history of antineoplastic chemotherapy: Secondary | ICD-10-CM | POA: Insufficient documentation

## 2018-08-29 DIAGNOSIS — C189 Malignant neoplasm of colon, unspecified: Secondary | ICD-10-CM

## 2018-08-29 DIAGNOSIS — N3000 Acute cystitis without hematuria: Secondary | ICD-10-CM

## 2018-08-29 DIAGNOSIS — D500 Iron deficiency anemia secondary to blood loss (chronic): Secondary | ICD-10-CM

## 2018-08-29 DIAGNOSIS — I10 Essential (primary) hypertension: Secondary | ICD-10-CM | POA: Insufficient documentation

## 2018-08-29 LAB — CBC WITH DIFFERENTIAL (CANCER CENTER ONLY)
Abs Immature Granulocytes: 0.02 10*3/uL (ref 0.00–0.07)
Basophils Absolute: 0.1 10*3/uL (ref 0.0–0.1)
Basophils Relative: 1 %
Eosinophils Absolute: 0.1 10*3/uL (ref 0.0–0.5)
Eosinophils Relative: 1 %
HCT: 41.1 % (ref 36.0–46.0)
Hemoglobin: 13.3 g/dL (ref 12.0–15.0)
Immature Granulocytes: 0 %
Lymphocytes Relative: 27 %
Lymphs Abs: 2.3 10*3/uL (ref 0.7–4.0)
MCH: 30 pg (ref 26.0–34.0)
MCHC: 32.4 g/dL (ref 30.0–36.0)
MCV: 92.8 fL (ref 80.0–100.0)
Monocytes Absolute: 0.8 10*3/uL (ref 0.1–1.0)
Monocytes Relative: 9 %
Neutro Abs: 5.2 10*3/uL (ref 1.7–7.7)
Neutrophils Relative %: 62 %
Platelet Count: 236 10*3/uL (ref 150–400)
RBC: 4.43 MIL/uL (ref 3.87–5.11)
RDW: 13.2 % (ref 11.5–15.5)
WBC Count: 8.4 10*3/uL (ref 4.0–10.5)
nRBC: 0 % (ref 0.0–0.2)

## 2018-08-29 LAB — URINALYSIS, COMPLETE (UACMP) WITH MICROSCOPIC
Bacteria, UA: NONE SEEN
Bilirubin Urine: NEGATIVE
Glucose, UA: NEGATIVE mg/dL
Hgb urine dipstick: NEGATIVE
Ketones, ur: NEGATIVE mg/dL
Leukocytes,Ua: NEGATIVE
Nitrite: NEGATIVE
Protein, ur: NEGATIVE mg/dL
Specific Gravity, Urine: 1.018 (ref 1.005–1.030)
pH: 5 (ref 5.0–8.0)

## 2018-08-29 LAB — COMPREHENSIVE METABOLIC PANEL
ALT: 18 U/L (ref 0–44)
AST: 17 U/L (ref 15–41)
Albumin: 3.8 g/dL (ref 3.5–5.0)
Alkaline Phosphatase: 93 U/L (ref 38–126)
Anion gap: 8 (ref 5–15)
BUN: 19 mg/dL (ref 8–23)
CO2: 27 mmol/L (ref 22–32)
Calcium: 9.2 mg/dL (ref 8.9–10.3)
Chloride: 107 mmol/L (ref 98–111)
Creatinine, Ser: 1.16 mg/dL — ABNORMAL HIGH (ref 0.44–1.00)
GFR calc Af Amer: 51 mL/min — ABNORMAL LOW (ref >60)
GFR calc non Af Amer: 44 mL/min — ABNORMAL LOW (ref >60)
Glucose, Bld: 87 mg/dL (ref 70–99)
Potassium: 4 mmol/L (ref 3.5–5.1)
Sodium: 142 mmol/L (ref 135–145)
Total Bilirubin: 0.5 mg/dL (ref 0.3–1.2)
Total Protein: 7 g/dL (ref 6.5–8.1)

## 2018-08-29 LAB — RETICULOCYTES
Immature Retic Fract: 12 % (ref 2.3–15.9)
RBC.: 4.42 MIL/uL (ref 3.87–5.11)
Retic Count, Absolute: 73.4 10*3/uL (ref 19.0–186.0)
Retic Ct Pct: 1.7 % (ref 0.4–3.1)

## 2018-08-29 LAB — FERRITIN: Ferritin: 46 ng/mL (ref 11–307)

## 2018-08-29 NOTE — Telephone Encounter (Signed)
Called pt & gave report of negative U/A per [employer].  She asked about blood test done & results given.  Encouraged pt to check with her PCP or urologist about symptoms.

## 2018-08-29 NOTE — Telephone Encounter (Signed)
Received call from pt stating that she has a UTI & needs ATB.  Reviewed chart & RN tried to reach pt to set up.  She states she didn't get message until late & wanted to know if still OK to get.  She reports off & on symptoms & taking something from health food store called [employer] which has helped some.  She reports history of freq UTI & was told once that she may have interstitial cystitis.  Discussed with Sandi Mealy PA & ordered UA & Culture.

## 2018-08-29 NOTE — Telephone Encounter (Signed)
-----   Message from [employer]. Tanner, PA-C sent at 08/29/2018  2:34 PM EDT ----- The patient's UA was completely negative. She needs to follow up with her PCP if this continues.  Thanks, Jacinto Reap

## 2018-08-30 LAB — URINE CULTURE: Culture: <10000 — AB

## 2018-08-31 NOTE — Progress Notes (Signed)
These preliminary result these preliminary results were noted.  Awaiting final report.

## 2018-09-04 DIAGNOSIS — H43393 Other vitreous opacities, bilateral: Secondary | ICD-10-CM | POA: Diagnosis not present

## 2018-09-04 DIAGNOSIS — G43809 Other migraine, not intractable, without status migrainosus: Secondary | ICD-10-CM | POA: Diagnosis not present

## 2018-09-04 DIAGNOSIS — Z961 Presence of intraocular lens: Secondary | ICD-10-CM | POA: Diagnosis not present

## 2018-09-04 DIAGNOSIS — H401133 Primary open-angle glaucoma, bilateral, severe stage: Secondary | ICD-10-CM | POA: Diagnosis not present

## 2018-09-27 ENCOUNTER — Other Ambulatory Visit: Payer: Self-pay

## 2018-09-27 ENCOUNTER — Ambulatory Visit (HOSPITAL_COMMUNITY)
Admission: EM | Admit: 2018-09-27 | Discharge: 2018-09-27 | Disposition: A | Discharge status: Home / Self Care | Payer: Medicare Other | Attending: Emergency Medicine | Admitting: Emergency Medicine

## 2018-09-27 ENCOUNTER — Encounter (HOSPITAL_COMMUNITY): Payer: Self-pay | Admitting: Emergency Medicine

## 2018-09-27 DIAGNOSIS — N3001 Acute cystitis with hematuria: Secondary | ICD-10-CM | POA: Diagnosis not present

## 2018-09-27 LAB — POCT URINALYSIS DIP (DEVICE)
Bilirubin Urine: NEGATIVE
Glucose, UA: NEGATIVE mg/dL
Ketones, ur: NEGATIVE mg/dL
Nitrite: NEGATIVE
Protein, ur: NEGATIVE mg/dL
Specific Gravity, Urine: 1.02 (ref 1.005–1.030)
Urobilinogen, UA: 0.2 mg/dL (ref 0.0–1.0)
pH: 6 (ref 5.0–8.0)

## 2018-09-27 MED ORDER — DOXYCYCLINE HYCLATE 100 MG PO CAPS: 100.0000 mg | ORAL_CAPSULE | Freq: Two times a day (BID) | ORAL | 0 refills | Status: DC

## 2018-09-27 NOTE — ED Triage Notes (Signed)
Pt sts dysuria and back pain with some nausea this morning; pt sts recently treated for UTI per pt

## 2018-09-27 NOTE — ED Provider Notes (Signed)
[place]    CSN: [id number] Arrival date & time: 09/27/18  1206     History   Chief Complaint Chief Complaint  Patient presents with  . Back Pain  . Dysuria    HPI [name] is a 78 y.o. female.   Patient states she woke up this morning with right-sided flank pain and dysuria.  She denies fever, chills, abdominal pain, vomiting, diarrhea, or other symptoms.  She states she has a history of UTIs.  Patient had a urine culture on 08/29/2018; which had insignificant growth of <10,000 colonies.  Patient states her UTIs have previously been treated successfully with doxycycline and she requests that today if needed.    The history is provided by the patient and a relative.    Past Medical History:  Diagnosis Date  . Anemia   . Arthritis   . Astigmatism    both eyes  . Cancer [facility])    colon surgery done last chemo tx 01-17-17  . Cataract   . Colon cancer ([place])   . Coronary artery disease, non-occlusive   . Dyspnea    more so exertion  . Dysrhythmia    skipped beats every now and then  . GERD (gastroesophageal reflux disease)   . Glaucoma    both eyes right eye worse than left  . Hammer toes of both feet   . Headache    migraines with aura  . Heel spur    both heels and front  . HSV (herpes simplex virus) infection   . Hyperglycemia    denies  . Hyperlipidemia   . IBS (irritable bowel syndrome)   . IC (interstitial cystitis)   . Insomnia disorder related to known organic factor   . Osteoporosis   . PONV (postoperative nausea and vomiting)    has had more surgeries, and had no problems  . Stroke ([place]) 2016  . Vitamin D deficiency     Patient Active Problem List   Diagnosis Date Noted  . UTI (urinary tract infection) 06/30/2018  . Blood clotting disorder ([place]) 06/30/2018  . Traumatic hematoma of knee, left, initial encounter 10/28/2017  . Fall 10/28/2017  . Abnormality of breast on screening mammography 08/01/2017  . Genetic testing  04/25/2017  . Adenopathy   . History of DVT (deep vein thrombosis) 11/24/2016  . Cancer of overlapping sites of colon ([place]) 09/23/2016  . Estrogen deficiency 07/30/2016  . Screening mammogram, encounter for 07/30/2016  . Routine general medical examination at a health care facility 07/27/2016  . H/O: CVA (cerebrovascular accident) 05/06/2015  . HTN (hypertension) 05/06/2015  . HLD (hyperlipidemia) 05/06/2015  . GERD (gastroesophageal reflux disease) 05/06/2015  . CAD (coronary artery disease) 05/06/2015  . Seborrheic keratosis 07/05/2012  . 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 Callus of foot 10/16/2010  . Coronary artery disease, non-occlusive 10/16/2010  . Insomnia 02/02/2010  . HAND PAIN, BILATERAL 01/09/2010  . HYPERGLYCEMIA 07/29/2009  . ALLERGIC RHINITIS 12/04/2008  . Vitamin D deficiency 04/30/2008  . COLONIC POLYPS 06/28/2007  . IBS 03/17/2007  . INTERSTITIAL CYSTITIS 03/17/2007  . Osteoporosis 03/17/2007    Past Surgical History:  Procedure Laterality Date  . ABDOMINAL HYSTERECTOMY     1 ovary and womb removed  . CARDIAC CATHETERIZATION  7/12   non obst dz -- not sure what yr  . CHOLECYSTECTOMY    . EUS N/A 02/03/2017   Procedure: UPPER ENDOSCOPIC ULTRASOUND (EUS) LINEAR;  Surgeon: [name], MD;  Location: [facility];  Service:  Endoscopy;  Laterality: N/A;  . EYE SURGERY Bilateral    ioc for cataracts   . LAPAROSCOPIC RIGHT COLECTOMY Right 11/10/2016   Procedure: LAPAROSCOPIC EXTENDED RIGHT COLECTOMY;  Surgeon: [name], MD;  Location: [facility];  Service: General;  Laterality: Right;  . PORTACATH PLACEMENT Left 12/22/2016   Procedure: INSERTION PORT-A-CATH;  Surgeon: [name], MD;  Location: [facility];  Service: General;  Laterality: Left;  . ROTATOR CUFF REPAIR Bilateral     OB History   No obstetric history on file.      Home Medications    Prior to Admission medications   Medication Sig Start Date End Date Taking? Authorizing  Provider  atorvastatin (LIPITOR) 40 MG tablet Take 1 tablet (40 mg total) by mouth daily at 6 PM. 08/01/17   Tower, [name], MD  b complex vitamins tablet Take 1 tablet by mouth daily.    [provider]  cephALEXin (KEFLEX) 500 MG capsule Take 1 capsule (500 mg total) by mouth 2 (two) times daily. Patient not taking: Reported on 09/27/2018 06/30/18   Tower, [name], MD  Cholecalciferol (VITAMIN D) 2000 units tablet Take 2,000 Units by mouth 3 (three) times a week.     [provider]  COMBIGAN 0.2-0.5 % ophthalmic solution INSTILL 1 DROP INTO BOTH EYES TWICE A DAY 01/25/18   [provider]  doxycycline (VIBRAMYCIN) 100 MG capsule Take 1 capsule (100 mg total) by mouth 2 (two) times daily. 09/27/18   Sharion Balloon, NP  ELIQUIS 5 MG TABS tablet TAKE 1 TABLET BY MOUTH TWICE A DAY 08/24/18   [name], MD  lactose free nutrition (BOOST) LIQD Take 237 mLs by mouth daily as needed.     [provider]  latanoprost (XALATAN) 0.005 % ophthalmic solution Place 1 drop into both eyes at bedtime. 12/06/17   [provider]  lidocaine-prilocaine (EMLA) cream Apply to affected area once Patient taking differently: Apply to affected area once (near port) 12/18/16   [name], MD  Multiple Vitamin (MULTIVITAMIN WITH MINERALS) TABS tablet Take 1 tablet by mouth daily.    [provider]  potassium chloride SA (K-DUR,KLOR-CON) 20 MEQ tablet Take 1 tablet (20 mEq total) by mouth daily. 08/01/17   Tower, [name], MD  valACYclovir (VALTREX) 500 MG tablet TAKE 1 TABLET (500 MG TOTAL) BY MOUTH 2 (TWO) TIMES DAILY. 07/31/18   Tower, [name], MD    Family History Family History  Problem Relation Age of Onset  . Alcohol abuse Mother   . Heart attack Mother   . GER disease Mother   . GER disease Father   . Alcohol abuse Father   . Stroke Father   . Tuberculosis Father   . Heart attack Father   . Aneurysm Father   . Mental illness Sister   . Depression Sister   . Breast  cancer Neg Hx     Social History Social History   Tobacco Use  . Smoking status: Never Smoker  . Smokeless tobacco: Never Used  Substance Use Topics  . Alcohol use: No    Alcohol/week: 0.0 standard drinks  . Drug use: No     Allergies   Salmon [fish allergy], Ampicillin, Codeine, Flagyl [metronidazole], Meperidine hcl, Neomycin, Propoxyphene hcl, Sulfa antibiotics, and Tramadol hcl   Review of Systems Review of Systems  Constitutional: Negative for chills and fever.  HENT: Negative for ear pain and sore throat.   Eyes: Negative for pain and visual  disturbance.  Respiratory: Negative for cough and shortness of breath.   Cardiovascular: Negative for chest pain and palpitations.  Gastrointestinal: Negative for abdominal pain, diarrhea and vomiting.  Genitourinary: Positive for dysuria and flank pain. Negative for hematuria.  Musculoskeletal: Negative for arthralgias.  Skin: Negative for color change and rash.  Neurological: Negative for seizures and syncope.  All other systems reviewed and are negative.    Physical Exam Triage Vital Signs ED Triage Vitals  Enc Vitals Group     BP      Pulse      Resp      Temp      Temp src      SpO2      Weight      Height      Head Circumference      Peak Flow      Pain Score      Pain Loc      Pain Edu?      Excl. in [place]?    No data found.  Updated Vital Signs BP 116/70 (BP Location: Right Arm)   Pulse 98   Temp 98.1 F (36.7 C) (Oral)   Resp 18   SpO2 95%   Visual Acuity Right Eye Distance:   Left Eye Distance:   Bilateral Distance:    Right Eye Near:   Left Eye Near:    Bilateral Near:     Physical Exam Vitals signs and nursing note reviewed.  Constitutional:      General: She is not in acute distress.    Appearance: She is well-developed.  HENT:     Head: Normocephalic and atraumatic.     Mouth/Throat:     Mouth: Mucous membranes are moist.     Pharynx: Oropharynx is clear.  Eyes:      Conjunctiva/sclera: Conjunctivae normal.  Neck:     Musculoskeletal: Neck supple.  Cardiovascular:     Rate and Rhythm: Normal rate and regular rhythm.     Heart sounds: No murmur.  Pulmonary:     Effort: Pulmonary effort is normal. No respiratory distress.     Breath sounds: Normal breath sounds.  Abdominal:     General: Bowel sounds are normal.     Palpations: Abdomen is soft.     Tenderness: There is no abdominal tenderness. There is no right CVA tenderness, left CVA tenderness, guarding or rebound.  Skin:    General: Skin is warm and dry.  Neurological:     Mental Status: She is alert.      UC Treatments / Results  Labs (all labs ordered are listed, but only abnormal results are displayed) Labs Reviewed  POCT URINALYSIS DIP (DEVICE) - Abnormal; Notable for the following components:      Result Value   Hgb urine dipstick LARGE (*)    Leukocytes,Ua TRACE (*)    All other components within normal limits  URINE CULTURE    EKG   Radiology No results found.  Procedures Procedures (including critical care time)  Medications Ordered in UC Medications - No data to display  Initial Impression / Assessment and Plan / UC Course  I have reviewed the triage vital signs and the nursing notes.  Pertinent labs & imaging results that were available during my care of the patient were reviewed by me and considered in my medical decision making (see chart for details).   Acute cystitis with hematuria.  Treating with doxycycline.  Urine culture sent.  Discussed with patient and  her daughter that if the urine culture indicates the need for an antibiotic change we will call her.  Patient and her daughter insist upon doxycycline.  Instructed patient to return here or go to the emergency department if she develops worsening pain, fever, chills, vomiting, or other concerning symptoms.     Final Clinical Impressions(s) / UC Diagnoses   Final diagnoses:  Acute cystitis with hematuria      Discharge Instructions     Take the antibiotic as prescribed for your urinary tract infection.    A urine culture will be sent today to make sure the antibiotic is the correct one for your infection.  We will call you if your antibiotic needs to be changed.    Return here or go to the emergency department if you develop worsening pain, fever, chills, vomiting, or other concerning symptoms.        ED Prescriptions    Medication Sig Dispense Auth. Provider   doxycycline (VIBRAMYCIN) 100 MG capsule Take 1 capsule (100 mg total) by mouth 2 (two) times daily. 20 capsule Sharion Balloon, NP     Controlled Substance Prescriptions [place] Controlled Substance Registry consulted? Not Applicable   Sharion Balloon, NP 09/27/18 1343

## 2018-09-27 NOTE — Discharge Instructions (Signed)
Take the antibiotic as prescribed for your urinary tract infection.    A urine culture will be sent today to make sure the antibiotic is the correct one for your infection.  We will call you if your antibiotic needs to be changed.    Return here or go to the emergency department if you develop worsening pain, fever, chills, vomiting, or other concerning symptoms.

## 2018-09-28 ENCOUNTER — Inpatient Hospital Stay: Payer: Medicare Other | Admitting: Hematology

## 2018-09-28 ENCOUNTER — Inpatient Hospital Stay: Payer: Medicare Other | Attending: Nurse Practitioner

## 2018-10-12 ENCOUNTER — Other Ambulatory Visit: Payer: Self-pay | Admitting: *Deleted

## 2018-10-12 MED ORDER — ATORVASTATIN CALCIUM 40 MG PO TABS: 40.0000 mg | ORAL_TABLET | Freq: Every day | ORAL | 0 refills | Status: DC

## 2018-10-16 DIAGNOSIS — M5136 Other intervertebral disc degeneration, lumbar region: Secondary | ICD-10-CM | POA: Diagnosis not present

## 2018-10-16 DIAGNOSIS — M9905 Segmental and somatic dysfunction of pelvic region: Secondary | ICD-10-CM | POA: Diagnosis not present

## 2018-10-16 DIAGNOSIS — M9902 Segmental and somatic dysfunction of thoracic region: Secondary | ICD-10-CM | POA: Diagnosis not present

## 2018-10-16 DIAGNOSIS — M5134 Other intervertebral disc degeneration, thoracic region: Secondary | ICD-10-CM | POA: Diagnosis not present

## 2018-10-16 DIAGNOSIS — M9903 Segmental and somatic dysfunction of lumbar region: Secondary | ICD-10-CM | POA: Diagnosis not present

## 2018-10-18 DIAGNOSIS — M9903 Segmental and somatic dysfunction of lumbar region: Secondary | ICD-10-CM | POA: Diagnosis not present

## 2018-10-18 DIAGNOSIS — M5136 Other intervertebral disc degeneration, lumbar region: Secondary | ICD-10-CM | POA: Diagnosis not present

## 2018-10-18 DIAGNOSIS — M9905 Segmental and somatic dysfunction of pelvic region: Secondary | ICD-10-CM | POA: Diagnosis not present

## 2018-10-18 DIAGNOSIS — M9902 Segmental and somatic dysfunction of thoracic region: Secondary | ICD-10-CM | POA: Diagnosis not present

## 2018-10-18 DIAGNOSIS — M5134 Other intervertebral disc degeneration, thoracic region: Secondary | ICD-10-CM | POA: Diagnosis not present

## 2018-10-25 DIAGNOSIS — M9902 Segmental and somatic dysfunction of thoracic region: Secondary | ICD-10-CM | POA: Diagnosis not present

## 2018-10-25 DIAGNOSIS — M5134 Other intervertebral disc degeneration, thoracic region: Secondary | ICD-10-CM | POA: Diagnosis not present

## 2018-10-25 DIAGNOSIS — M9903 Segmental and somatic dysfunction of lumbar region: Secondary | ICD-10-CM | POA: Diagnosis not present

## 2018-10-25 DIAGNOSIS — M5136 Other intervertebral disc degeneration, lumbar region: Secondary | ICD-10-CM | POA: Diagnosis not present

## 2018-10-25 DIAGNOSIS — M9905 Segmental and somatic dysfunction of pelvic region: Secondary | ICD-10-CM | POA: Diagnosis not present

## 2019-01-05 ENCOUNTER — Telehealth: Payer: Self-pay | Admitting: Hematology

## 2019-01-05 ENCOUNTER — Telehealth: Payer: Self-pay

## 2019-01-05 NOTE — Telephone Encounter (Signed)
Patient called wanting to know her results from her last scan.  The last scan on file was 06/26/2018 and Dr. Burr Medico reviewed it with her. It showed no evidence of metastatic disease.  Informed patient she needs to come in for lab and a follow up and I am sending scheduling a message and they will call her with an appointment.

## 2019-01-05 NOTE — Telephone Encounter (Signed)
Scheduled appt per 11/13 sch message - unable to reach pt . Left message with apt date and time

## 2019-01-12 ENCOUNTER — Telehealth: Payer: Self-pay

## 2019-01-12 ENCOUNTER — Inpatient Hospital Stay: Payer: Medicare Other

## 2019-01-12 ENCOUNTER — Inpatient Hospital Stay: Payer: Medicare Other | Attending: Hematology | Admitting: Hematology

## 2019-01-12 ENCOUNTER — Telehealth: Payer: Self-pay | Admitting: Hematology

## 2019-01-12 NOTE — Telephone Encounter (Signed)
Left message for patient regarding her appointments today, informed her per Dr. Burr Medico MD can call her on the phone for her visit rather than her coming in.  I asked her to call me back and let me know if this is okay.

## 2019-01-12 NOTE — Telephone Encounter (Signed)
R/s appt 11/20 sch message - pt aware of new appt date and time

## 2019-02-01 ENCOUNTER — Other Ambulatory Visit: Payer: Self-pay | Admitting: Hematology

## 2019-02-03 ENCOUNTER — Telehealth: Payer: Self-pay | Admitting: Family Medicine

## 2019-02-05 ENCOUNTER — Telehealth: Payer: Self-pay | Admitting: Hematology

## 2019-02-05 NOTE — Telephone Encounter (Signed)
Please schedule f/u or PE late winter/early spring with lab prior  Thanks

## 2019-02-05 NOTE — Telephone Encounter (Signed)
Contacted patient to verify telephone visit for pre reg °

## 2019-02-05 NOTE — Telephone Encounter (Signed)
Lipid labs hasn't been checked since 07/28/2017 and no future appts., please advise

## 2019-02-05 NOTE — Progress Notes (Signed)
[place]   [phone] [phone]   Clinic Follow up Note   Patient Care Team: Tower, [name], MD as PCP - General [name], MD as Consulting Physician (Hematology) [name], MD as Attending Physician (Gastroenterology) [name], MD as Consulting Physician (General Surgery)   I connected with [name] on 02/06/2019 at 11:40 AM EST by telephone visit and verified that I am speaking with the correct person using two identifiers.  I discussed the limitations, risks, security and privacy concerns of performing an evaluation and management service by telephone and the availability of in person appointments. I also discussed with the patient that there may be a patient responsible charge related to this service. The patient expressed understanding and agreed to proceed.   Other persons participating in the visit and their role in the encounter:  None   Patient's location:  Home  Provider's location:  Office   CHIEF COMPLAINT: F/u of colon cancer  SUMMARY OF ONCOLOGIC HISTORY: Oncology History Overview Note  Cancer Staging Cancer of overlapping sites of colon [facility]) Staging form: Colon and Rectum, AJCC 8th Edition - Pathologic stage from 11/10/2016: Stage IIIB (pT3(m), pN1c, cM0) - Signed by [name], MD on 12/16/2016     Cancer of overlapping sites of colon [facility])  08/31/2016 Tumor Marker   Patient's tumor was tested for the following markers: CEA. Results of the tumor marker test revealed 2.0.   08/31/2016 Pathology Results   Colon biopsy Diagnosis: 1. Surgical [P], hepatic flexure. ADENOCARCINOMA. Moderately differentiated. 2. Surgical [P], transverse. ADENOCARCINOMA, Moderately differentiated.   08/31/2016 Initial Diagnosis   Colon cancer metastasized to liver ([place])   08/31/2016 Procedure   - 1) Polypoid mass at the hepatic flexure, 2cm, soft but clearly neoplastic.  - 2) Clearly malignant mass in the distal  transverse colon; 4cm, firm, 1/2 cirumference, ulcerated. ' - 3) Four small (3-86m) scattered typical appearing adenomas throughout the colon, notremoved since she mistakenly stayed on her plavix for this procedure.   09/01/2016 Imaging   CT Abdomen Pelvis W Contrast IMPRESSION: There are 2 colonic lesions involving the proximal ascending colon and proximal transverse colon, as detailed above, highly concerning for colonic neoplasm. In addition, there are at least 2 indeterminate liver lesions which are suspicious for potential metastatic disease.    09/16/2016 Imaging   Mr Liver W Wo Contrast IMPRESSION: Lesion within central right lobe of liver exhibits of peripheral enhancement and is suspicious for liver metastasis. Lesion within caudate lobe of liver identified on recent CT does not have a corresponding signal or enhancement abnormality on today's study. Within the inferior right lobe of liver there is an enhancing structure which is favored to represent an atypical benign hemangioma with liver metastasis felt less likely. The transverse colon lesion is again identified compatible with colonic adenocarcinoma.   10/04/2016 PET scan   PET 10/04/16 IMPRESSION: 1. Highly hypermetabolic proximal transverse colon mass, maximum SUV 30.8. Small focus of hypermetabolic activity in the mesentery just above this mass probably represents a local involved lymph node. 2. Moderately hypermetabolic ascending colon mass, maximum SUV 8.7. 3. Highly hypermetabolic enlarged portacaval lymph node, maximum SUV 16.7. 4. Focus of hypermetabolic activity near the duodenum bulb could be physiologic or due to an adjacent lymph node. 5. None of the liver lesions are discernibly hypermetabolic. Particularly the more cephalad right hepatic lobe lesion merits surveillance, it had nonspecific enhancement characteristics on prior cross-sectional imaging and only a thin enhancing wall which could  conceivably predispose to  false negative due to central necrosis. 6. Several tiny chronic pulmonary nodules are likely benign. 7. Other imaging findings of potential clinical significance: Aortic Atherosclerosis (ICD10-I70.0). Coronary atherosclerosis. Bilateral nonobstructive nephrolithiasis.   11/10/2016 Surgery   LAPAROSCOPIC EXTENDED RIGHT COLECTOMY by [name]    11/10/2016 Pathology Results   Diagnosis 11/10/16 Colon, segmental resection for tumor, Ascending and Transverse ADENOCARCINOMA OF THE ASCENDING COLON WITH EXTRA CELLULAR MUCIN, GRADE 3, SIZE 3.4 CM THE TUMOR INVADES MUSCULARIS PROPRIA POORLY DIFFERENTIATED ADENOCARCINOMA OF THE TRANSVERSE COLON, GRADE 4, SIZE 6.1 CM THE TUMOR INVADES THROUGH MUSCULARIS PROPRIA INTO PERICOLONIC SOFT TISSUE EXTRAMURAL SATELLITE TUMOR NODULES IS IDENTIFIED (X1) ALL MARGINS OF RESECTION ARE NEGATIVE FOR CARCINOMA METASTATIC CARCINOMA IN ONE OF SIXTY LYMPH NODES (1/60) TABULAR ADENOMA X1   12/28/2016 Imaging   MRI abdomen w/wo contrast IMPRESSION: Motion degraded images.  Two indeterminate right liver lesions measuring up to 1.6 cm, as described above. Continued attention on follow-up is suggested.  Two periportal lymph nodes measuring up to 1.9 cm, suspicious for nodal metastases.  Postsurgical changes related to right colon resection.    12/29/2016 - 04/11/2017 Chemotherapy   FOLFOX every 2 weeks starting 12/29/16 last cycle 7 on 04/11/17.    02/03/2017 Procedure   EUS Per [name] Endoscopic Finding Findings: The examined esophagus was endoscopically normal. The entire examined stomach was endoscopically normal. The examined duodenum was endoscopically normal. Endosonographic Finding (limited evaluation for tissue acquisition): 1. One enlarged lymph node (vs soft tissue mass) was visualized in the porta hepatis region. It measured 16 mm in maximal cross-sectional diameter. The node (vs soft tissue) was oval, isoechoic and had poorly defined margins.  Fine needle aspiration for cytology was performed. Color Doppler imaging was utilized prior to needle puncture to confirm a lack of significant vascular structures within the needle path. Threepasses were made with the 25 gauge needle using a transgastric approach. 2. CBD was normal, non-dilated 3. Limited views of the liver, spleen, pancreas were all normal.  Impression: One periportal soft tissue mass (lymphnode?) measuring 4m was noted and sampled with FNA.   02/03/2017 Pathology Results   Diagnosis FINE NEEDLE ASPIRATION, ENDOSCOPIC, PERI PORTAL NODE (SPECIMEN 1 OF 1 COLLECTED 02/03/17): NO MALIGNANT CELLS IDENTIFIED. SCANT LYMPHOID AND BENIGN GLANDULAR ELEMENTS.   03/09/2017 Imaging   IMPRESSION: Interval decrease in several tiny low-attenuation liver lesions the, consistent with improving hepatic metastases.  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  TomorNext Lynch+ cancer Next was ordered throught he laboratory [facility]  TumorNext Lynch with CancerNext: Paired Germline and Tumor Analyses for EnhancedDiagnosis of Lynch Syndrome plus Analyses of 29 Additional Genes Associated with Hereditary Cancer.  Germline Genes analyzed: MLH1, MSH2, MSH6, PMS2, APC, ATM, BARD1, BMPR1A, BRIP1, CDH1, CDKN2A, CHEK2, DICER1, MRE11A, MUTYH, NBN, PALB2, PTEN, RAD50, RAD51C, RAD51D, SMAD4, STK11, TP53, CDK4, NF1, BRCA1, BRCA2, POLD1, POLE, SMARCA4, HOXB13 (sequencing and deletion/duplication); EPCAM, GREM1 (deletion/duplication only).  Results: Germline- Negative, no variants detected.  Tumor/Somatic-  BRAF V600E detected, No variants in KRAS or NRAS detected.  MLH1 promoter hypermethylation present, Microsatellite High.   Somatic pathogenic mutations in MSH2 c.687delA and  pathogenic mutation in MSH6 c.3261dupC.  2 Somatic Variants of uncertain significance in MSH2 called p.A123T and p.D654N.   The date of this  test report is 04/07/2017.     04/26/2017 - 06/12/2017 Chemotherapy   Xeloda 2032m in the AM and 15022min the PM started on 04/26/17. Changed to 150061mID on 05/16/17   06/13/2017 Imaging    CT CAP W Contrast 06/13/17 IMPRESSION: 1. Continued regression of hepatic metastatic disease. No new or progressive findings. 2. Surgical changes from a right hemicolectomy. No findings for recurrent cancer. No mesenteric or retroperitoneal mass or Adenopathy.   09/19/2017 Imaging   CT AP W Contrast 09/19/17  IMPRESSION: 1. The inferior right lobe of liver lesion appears slightly increased in size when compared with the previous exam. 2. No new liver lesions identified. 3. Stable appearance of the colon status post right hemicolectomy. 4.  Aortic Atherosclerosis (ICD10-I70.0).    10/01/2017 Imaging   MRI Abdomen W WO Contrast IMPRESSION: 1. Two small, subtle liver lesions are again identified involving segment 7 and segment 6. When compared with the MRI from 12/28/2016 these are not significantly changed. Compared with the MRI from 09/15/2016 the peripherally enhancing lesion within segment 7 has decreased in size in the interval. There has been no significant change in the segment 6 lesion. 2. No new foci of suspected metastasis identified.   03/29/2018 Imaging   MRI Abdomen  IMPRESSION: 1. The 2 small subtle liver lesions involving segment 7 and segment 6 are again noted. When compared with 10/01/2017 these are stable to minimally decreased in size. No new liver lesion identified. 2.  Aortic Atherosclerosis (ICD10-I70.0).    03/30/2018 Imaging   CT chest  IMPRESSION: 1. Solitary new solid pulmonary nodule in the anterior right middle lobe measuring 4 mm. Recommend attention on follow-up chest CT in 3 months. 2. No additional potential findings of  metastatic disease in he chest. 3. Nonobstructing left nephrolithiasis. 4. One vessel coronary atherosclerosis.  Aortic Atherosclerosis (ICD10-I70.0).       CURRENT THERAPY:  Observation  INTERVAL HISTORY:  [name] here for a follow up of colon cancer.  She identified herself by date of birth.   She has been having hips pain for a month, intermittent, worse when she sits, able to walk without difficulty. She previously saw a chiropractics but stopped due to lack of insurance coverage.   She takes tylenol pm for insomnia.   She still rides horse, and remains physically active.  Occasional abdominal bloating, mild constipation, manageable. Appetite good, weight stable.  She denies any other new symptoms.  Review of system otherwise   MEDICAL HISTORY:  Past Medical History:  Diagnosis Date   Anemia    Arthritis    Astigmatism    both eyes   [place]cer ([place]  colon surgery done last chemo tx 01-17-17   Cataract    [facility]  Coronary artery disease, non-occlusive    Dyspnea    more so exertion   Dysrhythmia    skipped beats every now and then   GERD (gastroesophageal reflux disease)    Glaucoma    both eyes right eye worse than left   Hammer toes of both feet    Headache    migraines with aura   Heel spur    both heels and front   HSV (herpes simplex virus) infection    Hyperglycemia    denies   Hyperlipidemia    IBS (irritable bowel syndrome)    IC (interstitial cystitis)    Insomnia disorder related to known organic factor    Osteoporosis  PONV (postoperative nausea and vomiting)    has had more surgeries, and had no problems   Stroke [facility]) 2016   Vitamin D deficiency     SURGICAL HISTORY: Past Surgical History:  Procedure Laterality Date   ABDOMINAL HYSTERECTOMY     1 ovary and womb removed   CARDIAC CATHETERIZATION  7/12   non obst dz -- not sure what yr   CHOLECYSTECTOMY     EUS N/A  02/03/2017   Procedure: UPPER ENDOSCOPIC ULTRASOUND (EUS) LINEAR;  Surgeon: [name], MD;  Location: [facility];  Service: Endoscopy;  Laterality: N/A;   EYE SURGERY Bilateral    ioc for cataracts    LAPAROSCOPIC RIGHT COLECTOMY Right 11/10/2016   Procedure: LAPAROSCOPIC EXTENDED RIGHT COLECTOMY;  Surgeon: [name], MD;  Location: [place];  Service: General;  Laterality: Right;   PORTACATH PLACEMENT Left 12/22/2016   Procedure: INSERTION PORT-A-CATH;  Surgeon: [name], MD;  Location: [place];  Service: General;  Laterality: Left;   ROTATOR CUFF REPAIR Bilateral     I have reviewed the social history and family history with the patient and they are unchanged from previous note.  ALLERGIES:  is allergic to salmon [fish allergy]; ampicillin; codeine; flagyl [metronidazole]; meperidine hcl; neomycin; propoxyphene hcl; sulfa antibiotics; and tramadol hcl.  MEDICATIONS:  Current Outpatient Medications  Medication Sig Dispense Refill   atorvastatin (LIPITOR) 40 MG tablet Take 1 tablet (40 mg total) by mouth daily at 6 PM. 90 tablet 0   b complex vitamins tablet Take 1 tablet by mouth daily.     cephALEXin (KEFLEX) 500 MG capsule Take 1 capsule (500 mg total) by mouth 2 (two) times daily. (Patient not taking: Reported on 09/27/2018) 14 capsule 0   Cholecalciferol (VITAMIN D) 2000 units tablet Take 2,000 Units by mouth 3 (three) times a week.      COMBIGAN 0.2-0.5 % ophthalmic solution INSTILL 1 DROP INTO BOTH EYES TWICE A DAY  1   doxycycline (VIBRAMYCIN) 100 MG capsule Take 1 capsule (100 mg total) by mouth 2 (two) times daily. 20 capsule 0   ELIQUIS 5 MG TABS tablet TAKE 1 TABLET BY MOUTH TWICE A DAY 60 tablet 2   lactose free nutrition (BOOST) LIQD Take 237 mLs by mouth daily as needed.      latanoprost (XALATAN) 0.005 % ophthalmic solution Place 1 drop into both eyes at bedtime.  5   lidocaine-prilocaine (EMLA) cream Apply to affected area once  (Patient taking differently: Apply to affected area once (near port)) 30 g 3   Multiple Vitamin (MULTIVITAMIN WITH MINERALS) TABS tablet Take 1 tablet by mouth daily.     potassium chloride SA (K-DUR,KLOR-CON) 20 MEQ tablet Take 1 tablet (20 mEq total) by mouth daily. 90 tablet 3   valACYclovir (VALTREX) 500 MG tablet TAKE 1 TABLET (500 MG TOTAL) BY MOUTH 2 (TWO) TIMES DAILY. 6 tablet 0   Current Facility-Administered Medications  Medication Dose Route Frequency Provider Last Rate Last Admin   0.9 %  sodium chloride infusion  500 mL Intravenous Once [name], MD        PHYSICAL EXAMINATION: ECOG PERFORMANCE STATUS: 1 - Symptomatic but completely ambulatory  No vitals taken today, Exam not performed today   LABORATORY DATA:  I have reviewed the data as listed CBC Latest Ref Rng & Units 08/29/2018 06/26/2018 03/29/2018  WBC 4.0 - 10.5 K/uL 8.4 7.1 6.8  Hemoglobin 12.0 - 15.0 g/dL 13.3 13.1 11.9(L)  Hematocrit 36.0 -  46.0 % 41.1 40.5 36.6  Platelets 150 - 400 K/uL 236 228 242     CMP Latest Ref Rng & Units 08/29/2018 06/26/2018 03/29/2018  Glucose 70 - 99 mg/dL 87 80 -  BUN 8 - 23 mg/dL 19 24(H) -  Creatinine 0.44 - 1.00 mg/dL 1.16(H) 0.98 0.80  Sodium 135 - 145 mmol/L 142 144 -  Potassium 3.5 - 5.1 mmol/L 4.0 3.8 -  Chloride 98 - 111 mmol/L 107 109 -  CO2 22 - 32 mmol/L 27 26 -  Calcium 8.9 - 10.3 mg/dL 9.2 8.9 -  Total Protein 6.5 - 8.1 g/dL 7.0 7.1 -  Total Bilirubin 0.3 - 1.2 mg/dL 0.5 0.8 -  Alkaline Phos 38 - 126 U/L 93 89 -  AST 15 - 41 U/L 17 19 -  ALT 0 - 44 U/L 18 19 -      RADIOGRAPHIC STUDIES: I have personally reviewed the radiological images as listed and agreed with the findings in the report. No results found.   ASSESSMENT & PLAN:  [name] is a 78 y.o. female with   1. Synchronized colon adenocarcinoma, moderately differentiated, in hepaticflexureand distal transverse colon, pT3(m)N1cMx with indeterminate liver lesions, at least stage  IIIB, MSI-H -She was diagnosed in 08/31/16. She is s/p rightcolectomy,adjuvantFOLFOX which was changed toXelodaafter cycle 6 due to poor tolerance.  -Initial staging scan showed 2 liver lesions, suspicious for liver metastasis, but not hypermetabolic on PET. The liver lesion has decreased in size after adjuvant chemotherapy. -She is currently on observation -She is clinically stable, no concern for cancer recurrence at this point. -We will repeat CT chest, abdomen pelvis in 3 months, I will see her after the scan.  2. Genetics was negative  3. Liver lesions  -Previousbiopsy of theenlarged portal node was negative for metastasis, but also appearto shrink onchemotherapybased on repeat scans. -Dr. Bethel Born feels her liver lesions are difficult to biopsy, and overall stable. -Her last CT chest from 06/26/18 shows unremarkable upper abdomen. -will repeat CT CAP with contrast in 3 months   4. History of stroke  -she is on aspirin and Plavix, will continue.  5. 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 I encourage her to change to bid -I discussed due to Afib, and h/o of stroke, I strongly encouraged her to continue treatment. I previously provided her with a discount card for medicine. -Continue Eliquis 42m BIDfor AF indeifeintely if no contraindications, tolerating well.   7.Atrial fibrillation -on Eliquis -continue monitoring  8. Hip pain -Likely arthritis -I encouraged her to follow-up with PCP  Plan: -Follow-up in 3 months with lab and CT chest, abdomen pelvis with contrast a few days before. Will hold iv contrast if GFR<50     No problem-specific Assessment & Plan notes found for this encounter.   Orders Placed This Encounter  Procedures   CT Abdomen Pelvis W Contrast    Please hold iv contrast if EGFR<50, thanks    Standing Status:   Future     Standing Expiration Date:   02/06/2020    Order Specific Question:   If indicated for the ordered procedure, I authorize the administration of contrast media per Radiology protocol    Answer:   Yes    Order Specific Question:   [facility]   Order Specific Question:   Is Oral Contrast requested  for this exam?    Answer:   Yes, Per Radiology protocol    Order Specific Question:   Radiology Contrast Protocol - do NOT remove file path    Answer:   [email or web address]   CT Chest W Contrast    Please hold iv contrast if EGFR<50, thanks    Standing Status:   Future    Standing Expiration Date:   02/06/2020    Order Specific Question:   If indicated for the ordered procedure, I authorize the administration of contrast media per Radiology protocol    Answer:   Yes    Order Specific Question:   Preferred imaging location?    Answer:   [facility]    Order Specific Question:   Radiology Contrast Protocol - do NOT remove file path    Answer:   [email or web address]   I discussed the assessment and treatment plan with the patient. The patient was provided an opportunity to ask questions and all were answered. The patient agreed with the plan and demonstrated an understanding of the instructions.  The patient was advised to call back or seek an in-person evaluation if the symptoms worsen or if the condition fails to improve as anticipated.  I provided 22 minutes of non face-to-face telephone visit time during this encounter, and > 50% was spent counseling as documented under my assessment & plan.    [name], MD 02/06/2019   I, Joslyn Devon, am acting as scribe for [name], MD.   I have reviewed the above documentation for accuracy and completeness, and I agree with the above.

## 2019-02-05 NOTE — Telephone Encounter (Signed)
I left a detailed message on patient's voice mail to schedule f/u or cpx late winter/early spring w/ labs prior.

## 2019-02-06 ENCOUNTER — Encounter: Payer: Self-pay | Admitting: Hematology

## 2019-02-06 ENCOUNTER — Inpatient Hospital Stay: Payer: Medicare Other | Attending: Hematology | Admitting: Hematology

## 2019-02-06 DIAGNOSIS — E785 Hyperlipidemia, unspecified: Secondary | ICD-10-CM | POA: Diagnosis not present

## 2019-02-06 DIAGNOSIS — M25559 Pain in unspecified hip: Secondary | ICD-10-CM | POA: Diagnosis not present

## 2019-02-06 DIAGNOSIS — G4700 Insomnia, unspecified: Secondary | ICD-10-CM

## 2019-02-06 DIAGNOSIS — Z79899 Other long term (current) drug therapy: Secondary | ICD-10-CM

## 2019-02-06 DIAGNOSIS — C188 Malignant neoplasm of overlapping sites of colon: Secondary | ICD-10-CM

## 2019-02-06 DIAGNOSIS — I10 Essential (primary) hypertension: Secondary | ICD-10-CM | POA: Diagnosis not present

## 2019-02-06 DIAGNOSIS — Z7901 Long term (current) use of anticoagulants: Secondary | ICD-10-CM

## 2019-02-07 ENCOUNTER — Telehealth: Payer: Self-pay | Admitting: Hematology

## 2019-02-07 NOTE — Telephone Encounter (Signed)
Scheduled appt per 12/15 los. Mailed appt calendar with central radiology number attached.

## 2019-02-12 ENCOUNTER — Other Ambulatory Visit: Payer: Self-pay | Admitting: Family Medicine

## 2019-02-12 NOTE — Telephone Encounter (Signed)
I left another message on patient's voice mail to call back and schedule f/u or cpx late winter/early spring w/ labs prior in order to get a refill on her medication.

## 2019-02-14 ENCOUNTER — Other Ambulatory Visit: Payer: Self-pay | Admitting: Family Medicine

## 2019-02-17 ENCOUNTER — Other Ambulatory Visit: Payer: Self-pay | Admitting: Family Medicine

## 2019-02-20 ENCOUNTER — Encounter: Payer: Self-pay | Admitting: Family Medicine

## 2019-02-20 ENCOUNTER — Ambulatory Visit (INDEPENDENT_AMBULATORY_CARE_PROVIDER_SITE_OTHER): Payer: Medicare Other | Admitting: Family Medicine

## 2019-02-20 ENCOUNTER — Other Ambulatory Visit: Payer: Self-pay

## 2019-02-20 VITALS — BP 126/82 | HR 67 | Temp 97.9°F | Ht 64.0 in | Wt 160.5 lb

## 2019-02-20 DIAGNOSIS — M810 Age-related osteoporosis without current pathological fracture: Secondary | ICD-10-CM

## 2019-02-20 DIAGNOSIS — I10 Essential (primary) hypertension: Secondary | ICD-10-CM | POA: Diagnosis not present

## 2019-02-20 DIAGNOSIS — E7800 Pure hypercholesterolemia, unspecified: Secondary | ICD-10-CM

## 2019-02-20 DIAGNOSIS — R7303 Prediabetes: Secondary | ICD-10-CM | POA: Diagnosis not present

## 2019-02-20 DIAGNOSIS — E559 Vitamin D deficiency, unspecified: Secondary | ICD-10-CM | POA: Diagnosis not present

## 2019-02-20 DIAGNOSIS — D689 Coagulation defect, unspecified: Secondary | ICD-10-CM | POA: Diagnosis not present

## 2019-02-20 DIAGNOSIS — E2839 Other primary ovarian failure: Secondary | ICD-10-CM

## 2019-02-20 MED ORDER — ATORVASTATIN CALCIUM 40 MG PO TABS: 40.0000 mg | ORAL_TABLET | Freq: Every day | ORAL | 3 refills | Status: DC

## 2019-02-20 NOTE — Assessment & Plan Note (Signed)
Disc goals for lipids and reasons to control them Rev last labs with pt Rev low sat fat diet in detail Lab today on atorvastatin

## 2019-02-20 NOTE — Assessment & Plan Note (Signed)
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

## 2019-02-20 NOTE — Progress Notes (Signed)
Subjective:    Patient ID: [name], female    DOB: 1940-12-06, 78 y.o.   MRN: [id number]  This visit occurred during the SARS-CoV-2 public health emergency.  Safety protocols were in place, including screening questions prior to the visit, additional usage of staff PPE, and extensive cleaning of exam room while observing appropriate contact time as indicated for disinfecting solutions.    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  Every day to every other day  She also rides horses   No urinary pain   bp is stable today  No cp or palpitations or headaches or edema  This is controlled w/o medication currently (lifestyle)  She does take eliquis for h/o CVA BP Readings from Last 3 Encounters:  02/20/19 126/82  09/27/18 116/70  06/26/18 (!) 163/65     Pulse Readings from Last 3 Encounters:  02/20/19 67  09/27/18 98  06/26/18 68    Continues her oncology f/u for cancer of overlapping sites of colon   had a recent f/u telephone with Dr Burr Medico  No changes -continues to obs  Moving bowels is a little harder than it used to be /occ gas   She does not like vegetables  Needs to eat more (likes lima beans and peas)    H/o DVT and hypercoag disorder Takes eliquis   H/o OP  Falls -slipped and fell in the mud- did not injure herself  Fractures -none H/o vit D def - does not always  She is open to dexa   Prediabetes Lab Results  Component Value Date   HGBA1C 5.4 07/28/2017   Due for labs   Hyperlipidemia In setting of known CAD Lab Results  Component Value Date   CHOL 150 07/28/2017   HDL 54.40 07/28/2017   LDLCALC 71 07/28/2017   LDLDIRECT 138.9 07/05/2012   TRIG 125.0 07/28/2017   CHOLHDL 3 07/28/2017  taking atorvastatin 40 mg   She declines mammograms    She does not take her K very often- they are big to swallow   Patient Active Problem List   Diagnosis Date Noted  . Blood clotting disorder ([place]) 06/30/2018  . Traumatic hematoma of knee, left, initial encounter 10/28/2017  . Fall 10/28/2017  . Abnormality of breast on screening mammography 08/01/2017  . Genetic testing 04/25/2017  . Adenopathy   . History of DVT (deep vein thrombosis) 11/24/2016  . Cancer of overlapping sites of colon ([place]) 09/23/2016  . Estrogen deficiency 07/30/2016  . Screening mammogram, encounter for 07/30/2016  . Routine general medical examination at a health care facility 07/27/2016  . H/O: CVA (cerebrovascular accident) 05/06/2015  . HTN (hypertension) 05/06/2015  . HLD (hyperlipidemia) 05/06/2015  . GERD (gastroesophageal reflux disease) 05/06/2015  . CAD (coronary artery disease) 05/06/2015  . Seborrheic keratosis 07/05/2012  . 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 Callus of foot 10/16/2010  . Coronary artery disease, non-occlusive 10/16/2010  . Insomnia 02/02/2010  . HAND PAIN, BILATERAL 01/09/2010  . Prediabetes 07/29/2009  . ALLERGIC RHINITIS 12/04/2008  . Vitamin D deficiency 04/30/2008  . COLONIC POLYPS  06/28/2007  . IBS 03/17/2007  . INTERSTITIAL CYSTITIS 03/17/2007  . Osteoporosis 03/17/2007   Past Medical History:  Diagnosis Date  . Anemia   . Arthritis   . Astigmatism    both eyes  . Cancer [facility])    colon surgery done last chemo tx 01-17-17  . Cataract   . Colon cancer ([place])   . Coronary artery disease, non-occlusive   . Dyspnea    more so exertion  . Dysrhythmia    skipped beats every now and then  . GERD (gastroesophageal reflux disease)   . Glaucoma    both eyes right eye worse than left  . Hammer toes of both feet   . Headache    migraines with aura  . Heel spur    both heels and front  . HSV  (herpes simplex virus) infection   . Hyperglycemia    denies  . Hyperlipidemia   . IBS (irritable bowel syndrome)   . IC (interstitial cystitis)   . Insomnia disorder related to known organic factor   . Osteoporosis   . PONV (postoperative nausea and vomiting)    has had more surgeries, and had no problems  . Stroke ([place]) 2016  . Vitamin D deficiency    Past Surgical History:  Procedure Laterality Date  . ABDOMINAL HYSTERECTOMY     1 ovary and womb removed  . CARDIAC CATHETERIZATION  7/12   non obst dz -- not sure what yr  . CHOLECYSTECTOMY    . EUS N/A 02/03/2017   Procedure: UPPER ENDOSCOPIC ULTRASOUND (EUS) LINEAR;  Surgeon: [name], MD;  Location: [facility];  Service: Endoscopy;  Laterality: N/A;  . EYE SURGERY Bilateral    ioc for cataracts   . LAPAROSCOPIC RIGHT COLECTOMY Right 11/10/2016   Procedure: LAPAROSCOPIC EXTENDED RIGHT COLECTOMY;  Surgeon: [name], MD;  Location: [place];  Service: General;  Laterality: Right;  . PORTACATH PLACEMENT Left 12/22/2016   Procedure: INSERTION PORT-A-CATH;  Surgeon: [name], MD;  Location: [place];  Service: General;  Laterality: Left;  . ROTATOR CUFF REPAIR Bilateral    Social History   Tobacco Use  . Smoking status: Never Smoker  . Smokeless tobacco: Never Used  Substance Use Topics  . Alcohol use: No    Alcohol/week: 0.0 standard drinks  . Drug use: No   Family History  Problem Relation Age of Onset  . Alcohol abuse Mother   . Heart attack Mother   . GER disease Mother   . GER disease Father   . Alcohol abuse Father   . Stroke Father   . Tuberculosis Father   . Heart attack Father   . Aneurysm Father   . Mental illness Sister   . Depression Sister   . Breast cancer Neg Hx    Allergies  Allergen Reactions  . Salmon [Fish Allergy] Anaphylaxis    This happens with "CANNED SALMON" only, if eaten uncooked  . Ampicillin Nausea Only    Has patient had a PCN reaction causing  immediate rash, facial/tongue/throat swelling, SOB or lightheadedness with hypotension: No Has patient had a PCN reaction causing severe rash involving mucus membranes or skin necrosis: No Has patient had a PCN reaction that required hospitalization: No Has patient had a PCN reaction occurring within the last 10 years: No If all of the above answers are "NO", then may proceed with Cephalosporin use.   . Codeine Nausea And Vomiting  . Flagyl [Metronidazole] Nausea And Vomiting  Patient was taking this along with Neomycin and could not differentiate which med triggered the nausea & vomiting  . Meperidine Hcl Nausea And Vomiting  . Neomycin Nausea And Vomiting    Patient was taking this along with Flagyl and could not differentiate which med triggered the nausea & vomiting  . Propoxyphene Hcl Nausea And Vomiting  . Sulfa Antibiotics Hives  . Tramadol Hcl Nausea And Vomiting   Current Outpatient Medications on File Prior to Visit  Medication Sig Dispense Refill  . b complex vitamins tablet Take 1 tablet by mouth daily.    . Cholecalciferol (VITAMIN D) 2000 units tablet Take 2,000 Units by mouth 3 (three) times a week.     . COMBIGAN 0.2-0.5 % ophthalmic solution INSTILL 1 DROP INTO BOTH EYES TWICE A DAY  1  . ELIQUIS 5 MG TABS tablet TAKE 1 TABLET BY MOUTH TWICE A DAY 60 tablet 2  . lactose free nutrition (BOOST) LIQD Take 237 mLs by mouth daily as needed.     . latanoprost (XALATAN) 0.005 % ophthalmic solution Place 1 drop into both eyes at bedtime.  5  . lidocaine-prilocaine (EMLA) cream Apply to affected area once (Patient taking differently: Apply to affected area once (near port)) 30 g 3  . Multiple Vitamin (MULTIVITAMIN WITH MINERALS) TABS tablet Take 1 tablet by mouth daily.    . potassium chloride SA (K-DUR,KLOR-CON) 20 MEQ tablet Take 1 tablet (20 mEq total) by mouth daily. 90 tablet 3  . valACYclovir (VALTREX) 500 MG tablet TAKE 1 TABLET (500 MG TOTAL) BY MOUTH 2 (TWO) TIMES DAILY.  6 tablet 0   No current facility-administered medications on file prior to visit.    Review of Systems  Constitutional: Negative for activity change, appetite change, fatigue, fever and unexpected weight change.  HENT: Negative for congestion, ear pain, rhinorrhea, sinus pressure and sore throat.   Eyes: Negative for pain, redness and visual disturbance.  Respiratory: Negative for cough, shortness of breath and wheezing.   Cardiovascular: Negative for chest pain and palpitations.  Gastrointestinal: Positive for constipation. Negative for abdominal pain, blood in stool, diarrhea and nausea.       Occ constipation   Endocrine: Negative for polydipsia and polyuria.  Genitourinary: Negative for dysuria, frequency and urgency.       Occ has to get up and urinate at night   Musculoskeletal: Positive for arthralgias and back pain. Negative for myalgias.  Skin: Negative for pallor and rash.  Allergic/Immunologic: Negative for environmental allergies.  Neurological: Negative for dizziness, syncope and headaches.  Hematological: Negative for adenopathy. Does not bruise/bleed easily.  Psychiatric/Behavioral: Negative for decreased concentration and dysphoric mood. The patient is not nervous/anxious.        Objective:   Physical Exam Constitutional:      General: She is not in acute distress.    Appearance: Normal appearance. She is well-developed and normal weight. She is not ill-appearing.  HENT:     Head: Normocephalic and atraumatic.     Mouth/Throat:     Mouth: Mucous membranes are moist.  Eyes:     General: No scleral icterus.    Conjunctiva/sclera: Conjunctivae normal.     Pupils: Pupils are equal, round, and reactive to light.  Neck:     Thyroid: No thyromegaly.     Vascular: No carotid bruit or JVD.  Cardiovascular:     Rate and Rhythm: Normal rate and regular rhythm.     Pulses: Normal pulses.     Heart sounds:  Normal heart sounds. No gallop.   Pulmonary:     Effort:  Pulmonary effort is normal. No respiratory distress.     Breath sounds: Normal breath sounds. No wheezing or rales.  Abdominal:     General: Bowel sounds are normal. There is no distension or abdominal bruit.     Palpations: Abdomen is soft. There is no mass.     Tenderness: There is no abdominal tenderness.     Hernia: No hernia is present.     Comments: Post surgical changes noted  No tenderness  Musculoskeletal:     Cervical back: Normal range of motion and neck supple. No tenderness.     Right lower leg: No edema.     Left lower leg: No edema.     Comments: No kyphosis   Lymphadenopathy:     Cervical: No cervical adenopathy.  Skin:    General: Skin is warm and dry.     Coloration: Skin is not pale.     Findings: No erythema or rash.  Neurological:     Mental Status: She is alert.     Motor: No weakness.     Coordination: Coordination normal.     Gait: Gait normal.     Deep Tendon Reflexes: Reflexes are normal and symmetric. Reflexes normal.  Psychiatric:        Mood and Affect: Mood normal.        Cognition and Memory: Cognition and memory normal.           Assessment & Plan:   Problem List Items Addressed This Visit      Cardiovascular and Mediastinum   HTN (hypertension) - Primary    Controlled with lifestyle BP: 126/82  Doing well  Lab today  Takes K on and off- will check level and then advise further       Relevant Medications   atorvastatin (LIPITOR) 40 MG tablet   Other Relevant Orders   CBC w/Diff   Comprehensive metabolic panel   Lipid panel   TSH     Musculoskeletal and Integument   Osteoporosis    Due for dexa Ref done- she will call to schedule One fall/ no fx Disc fall prevention  Enc her strongly to take her vit D (is spotty with this)      Relevant Orders   VITAMIN D 25 Hydroxy (Vit-D Deficiency, Fractures)     Hematopoietic and Hemostatic   Blood clotting disorder (HCC)    With DVT and CVA in the past Doing well with bid  Eliquis         Other   Vitamin D deficiency    Level today  Not very compliant with her D3 Stressed imp for bone and overall health      Relevant Orders   VITAMIN D 25 Hydroxy (Vit-D Deficiency, Fractures)   Prediabetes   Relevant Orders   Hemoglobin A1c   HLD (hyperlipidemia)    Disc goals for lipids and reasons to control them Rev last labs with pt Rev low sat fat diet in detail Lab today on atorvastatin       Relevant Medications   atorvastatin (LIPITOR) 40 MG tablet   Other Relevant Orders   Lipid panel   Estrogen deficiency   Relevant Orders   DG Bone Density

## 2019-02-20 NOTE — Assessment & Plan Note (Signed)
Level today  Not very compliant with her D3 Stressed imp for bone and overall health

## 2019-02-20 NOTE — Patient Instructions (Addendum)
No need limit your vegetables   Get back on your vitamin D3 -this is important for bone health   Call to schedule your bone density test when you can  I placed the referral for that   Stay active   Labs today

## 2019-02-20 NOTE — Assessment & Plan Note (Signed)
Controlled with lifestyle BP: 126/82  Doing well  Lab today  Takes K on and off- will check level and then advise further

## 2019-02-20 NOTE — Assessment & Plan Note (Signed)
Due for dexa Ref done- she will call to schedule One fall/ no fx Disc fall prevention  Enc her strongly to take her vit D (is spotty with this)

## 2019-02-21 LAB — COMPREHENSIVE METABOLIC PANEL
ALT: 17 U/L (ref 0–35)
AST: 20 U/L (ref 0–37)
Albumin: 4.1 g/dL (ref 3.5–5.2)
Alkaline Phosphatase: 68 U/L (ref 39–117)
BUN: 21 mg/dL (ref 6–23)
CO2: 26 mEq/L (ref 19–32)
Calcium: 9.4 mg/dL (ref 8.4–10.5)
Chloride: 107 mEq/L (ref 96–112)
Creatinine, Ser: 0.87 mg/dL (ref 0.40–1.20)
GFR: 62.56 mL/min (ref >60.00)
Glucose, Bld: 86 mg/dL (ref 70–99)
Potassium: 4 mEq/L (ref 3.5–5.1)
Sodium: 140 mEq/L (ref 135–145)
Total Bilirubin: 0.5 mg/dL (ref 0.2–1.2)
Total Protein: 6.7 g/dL (ref 6.0–8.3)

## 2019-02-21 LAB — CBC WITH DIFFERENTIAL/PLATELET
Basophils Absolute: 0.1 10*3/uL (ref 0.0–0.1)
Basophils Relative: 1.3 % (ref 0.0–3.0)
Eosinophils Absolute: 0.1 10*3/uL (ref 0.0–0.7)
Eosinophils Relative: 1.3 % (ref 0.0–5.0)
HCT: 39.1 % (ref 36.0–46.0)
Hemoglobin: 12.9 g/dL (ref 12.0–15.0)
Lymphocytes Relative: 26.6 % (ref 12.0–46.0)
Lymphs Abs: 1.9 10*3/uL (ref 0.7–4.0)
MCHC: 33 g/dL (ref 30.0–36.0)
MCV: 91.9 fl (ref 78.0–100.0)
Monocytes Absolute: 0.7 10*3/uL (ref 0.1–1.0)
Monocytes Relative: 9.3 % (ref 3.0–12.0)
Neutro Abs: 4.5 10*3/uL (ref 1.4–7.7)
Neutrophils Relative %: 61.5 % (ref 43.0–77.0)
Platelets: 257 10*3/uL (ref 150.0–400.0)
RBC: 4.26 Mil/uL (ref 3.87–5.11)
RDW: 14.1 % (ref 11.5–15.5)
WBC: 7.3 10*3/uL (ref 4.0–10.5)

## 2019-02-21 LAB — LIPID PANEL
Cholesterol: 208 mg/dL — ABNORMAL HIGH (ref 0–200)
HDL: 63.8 mg/dL (ref >39.00)
LDL Cholesterol: 122 mg/dL — ABNORMAL HIGH (ref 0–99)
NonHDL: 144.69
Total CHOL/HDL Ratio: 3
Triglycerides: 112 mg/dL (ref 0.0–149.0)
VLDL: 22.4 mg/dL (ref 0.0–40.0)

## 2019-02-21 LAB — TSH: TSH: 3.3 u[IU]/mL (ref 0.35–4.50)

## 2019-02-21 LAB — HEMOGLOBIN A1C: Hgb A1c MFr Bld: 6.1 % (ref 4.6–6.5)

## 2019-02-21 LAB — VITAMIN D 25 HYDROXY (VIT D DEFICIENCY, FRACTURES): VITD: 24.65 ng/mL — ABNORMAL LOW (ref 30.00–100.00)

## 2019-02-22 ENCOUNTER — Encounter: Payer: Self-pay | Admitting: *Deleted

## 2019-03-01 ENCOUNTER — Telehealth: Payer: Self-pay | Admitting: Family Medicine

## 2019-03-01 NOTE — Telephone Encounter (Signed)
Patient called around 1:00 this afternoon and scheduled appointment with Dr.Copland on 03/05/19.

## 2019-03-01 NOTE — Telephone Encounter (Signed)
Pt left a voicemail requesting a referral to sports medicine  Pt having pain in shoulders, symptoms of torn rotator cuff  Requesting a call back with recommendations.   Please advise, thanks.

## 2019-03-01 NOTE — Telephone Encounter (Signed)
Please make an appt with Dr Lorelei Pont or if he is unavailable- sport med at another [facility]

## 2019-03-04 NOTE — Progress Notes (Signed)
Yvonne Sagar T. Yvonne Schirm, MD Primary Care and [name] [place], [address] Phone: [phone]  FAX: [place] - 79 y.o. female  MRN [id number]  Date of Birth: 02/02/1941  Visit Date: 03/05/2019  PCP: Abner Greenspan, MD  Referred by: Tower, [name], MD  Chief Complaint  Patient presents with  . Shoulder Pain    Left    This visit occurred during the SARS-CoV-2 public health emergency.  Safety protocols were in place, including screening questions prior to the visit, additional usage of staff PPE, and extensive cleaning of exam room while observing appropriate contact time as indicated for disinfecting solutions.   Subjective:   [name] is a 79 y.o. very pleasant female patient with Body mass index is 27.51 kg/m. who presents with the following:  She is a nice 79 year old lady who presents courtesy of [name] for evaluation of shoulder pain.  L h/o B RTC in the past. L partial and healed ok. L has been hurting for a few weeks. [place] and [place]. No injury.   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  She has tried Tylenol without much significant success.  She does have a history of right-sided rotator cuff tear which sounds to have been repaired by Dr. Ihor Gully.  She also had a left-sided partial-thickness rotator cuff tear and it sounds as if Dr. Para March treated this conservatively.  Ultimately she did well.  Now she is having some pain with internal range of motion and with reaching across her body.  L shoulder  Combo inj  Past Medical History, Surgical History, Social History, Family History, Problem List, Medications, and Allergies have been reviewed and updated if relevant.  Patient Active Problem List   Diagnosis Date Noted  . 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 Fall 10/28/2017    . Abnormality of breast on screening mammography 08/01/2017  . Genetic testing 04/25/2017  . Adenopathy   . History of DVT (deep vein thrombosis) 11/24/2016  . Cancer of overlapping sites of colon ([place]) 09/23/2016  . Estrogen deficiency 07/30/2016  . Screening mammogram, encounter for 07/30/2016  . Routine general medical examination at a health care facility 07/27/2016  . H/O: CVA (cerebrovascular accident) 05/06/2015  . HTN (hypertension) 05/06/2015  . HLD (hyperlipidemia) 05/06/2015  . GERD (gastroesophageal reflux disease) 05/06/2015  . CAD (coronary artery disease) 05/06/2015  . Seborrheic keratosis 07/05/2012  . 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 Callus of foot 10/16/2010  . Coronary artery disease, non-occlusive 10/16/2010  . Insomnia 02/02/2010  . HAND PAIN, BILATERAL 01/09/2010  . Prediabetes 07/29/2009  . ALLERGIC RHINITIS 12/04/2008  . Vitamin D deficiency 04/30/2008  . COLONIC POLYPS 06/28/2007  . IBS 03/17/2007  . INTERSTITIAL CYSTITIS 03/17/2007  . Osteoporosis 03/17/2007    Past Medical History:  Diagnosis Date  . Anemia   . Arthritis   . Astigmatism    both eyes  . Cancer [facility])    colon surgery done last chemo tx 01-17-17  . Cataract   . Colon cancer ([place])   . Coronary artery disease, non-occlusive   . Dyspnea    more so exertion  . Dysrhythmia    skipped beats every now and then  . GERD (gastroesophageal reflux disease)   . Glaucoma    both eyes right eye worse than left  .  Hammer toes of both feet   . Headache    migraines with aura  . Heel spur    both heels and front  . HSV (herpes simplex virus) infection   . Hyperglycemia    denies  . Hyperlipidemia   . IBS (irritable bowel syndrome)   . IC (interstitial cystitis)   . Insomnia disorder related to known organic factor   . Osteoporosis   . PONV (postoperative nausea and vomiting)    has had more surgeries, and had no problems  . Stroke ([place]) 2016  . Vitamin D deficiency     Past  Surgical History:  Procedure Laterality Date  . ABDOMINAL HYSTERECTOMY     1 ovary and womb removed  . CARDIAC CATHETERIZATION  7/12   non obst dz -- not sure what yr  . CHOLECYSTECTOMY    . EUS N/A 02/03/2017   Procedure: UPPER ENDOSCOPIC ULTRASOUND (EUS) LINEAR;  Surgeon: [name], MD;  Location: [facility];  Service: Endoscopy;  Laterality: N/A;  . EYE SURGERY Bilateral    ioc for cataracts   . LAPAROSCOPIC RIGHT COLECTOMY Right 11/10/2016   Procedure: LAPAROSCOPIC EXTENDED RIGHT COLECTOMY;  Surgeon: [name], MD;  Location: [place];  Service: General;  Laterality: Right;  . PORTACATH PLACEMENT Left 12/22/2016   Procedure: INSERTION PORT-A-CATH;  Surgeon: [name], MD;  Location: [place];  Service: General;  Laterality: Left;  . ROTATOR CUFF REPAIR Bilateral     Social History   Socioeconomic History  . Marital status: Married    Spouse name: Not on file  . Number of children: 4  . Years of education: Not on file  . Highest education level: Not on file  Occupational History  . Occupation: reitred    Employer: RETIRED  Tobacco Use  . Smoking status: Never Smoker  . Smokeless tobacco: Never Used  Substance and Sexual Activity  . Alcohol use: No    Alcohol/week: 0.0 standard drinks  . Drug use: No  . Sexual activity: Not on file  Other Topics Concern  . Not on file  Social History Narrative   Rides horses         Specialty providers    [facility]   Cardio- [facility]   Ortho--[place]   Rheum-[facility]   urol-[place]   ENT-- [employer]   Social Determinants of Health   Financial Resource Strain:   . Difficulty of Paying Living Expenses: Not on file  Food Insecurity:   . Worried About Charity fundraiser in the Last Year: Not on file  . Ran Out of Food in the Last Year: Not on file  Transportation Needs:   . Lack of Transportation (Medical): Not on file  . Lack of Transportation (Non-Medical): Not on file  Physical Activity:    . Days of Exercise per Week: Not on file  . Minutes of Exercise per Session: Not on file  Stress:   . Feeling of Stress : Not on file  Social Connections:   . Frequency of Communication with Friends and Family: Not on file  . Frequency of Social Gatherings with Friends and Family: Not on file  . Attends Religious Services: Not on file  . Active Member of Clubs or Organizations: Not on file  . Attends Archivist Meetings: Not on file  . Marital Status: Not on file  Intimate Partner Violence:   . Fear of Current or Ex-Partner: Not on file  . Emotionally Abused: Not on file  .  Physically Abused: Not on file  . Sexually Abused: Not on file    Family History  Problem Relation Age of Onset  . Alcohol abuse Mother   . Heart attack Mother   . GER disease Mother   . GER disease Father   . Alcohol abuse Father   . Stroke Father   . Tuberculosis Father   . Heart attack Father   . Aneurysm Father   . Mental illness Sister   . Depression Sister   . Breast cancer Neg Hx     Allergies  Allergen Reactions  . Salmon [Fish Allergy] Anaphylaxis    This happens with "CANNED SALMON" only, if eaten uncooked  . Ampicillin Nausea Only    Has patient had a PCN reaction causing immediate rash, facial/tongue/throat swelling, SOB or lightheadedness with hypotension: No Has patient had a PCN reaction causing severe rash involving mucus membranes or skin necrosis: No Has patient had a PCN reaction that required hospitalization: No Has patient had a PCN reaction occurring within the last 10 years: No If all of the above answers are "NO", then may proceed with Cephalosporin use.   . Codeine Nausea And Vomiting  . Flagyl [Metronidazole] Nausea And Vomiting    Patient was taking this along with Neomycin and could not differentiate which med triggered the nausea & vomiting  . Meperidine Hcl Nausea And Vomiting  . Neomycin Nausea And Vomiting    Patient was taking this along with Flagyl and  could not differentiate which med triggered the nausea & vomiting  . Propoxyphene Hcl Nausea And Vomiting  . Sulfa Antibiotics Hives  . Tramadol Hcl Nausea And Vomiting    Medication list reviewed and updated in full in [place].  GEN: No fevers, chills. Nontoxic. Primarily MSK c/o today. MSK: Detailed in the HPI GI: tolerating PO intake without difficulty Neuro: No numbness, parasthesias, or tingling associated. Otherwise the pertinent positives of the ROS are noted above.   Objective:   BP 124/70   Pulse 75   Temp 97.6 F (36.4 C) (Temporal)   Ht 5\' 4"  (1.626 m)   Wt 160 lb 4 oz (72.7 kg)   SpO2 97%   BMI 27.51 kg/m    GEN: WDWN, NAD, Non-toxic, Alert & Oriented x 3 HEENT: Atraumatic, Normocephalic.  Ears and Nose: No external deformity. EXTR: No clubbing/cyanosis/edema NEURO: Normal gait.  PSYCH: Normally interactive. Conversant. Not depressed or anxious appearing.  Calm demeanor.    Left-sided shoulder exam shows 20% loss of motion in the cervical spine without induction of Spurling's maneuver.  Nontender along the clavicle and at the acromioclavicular joint.  Nontender in the bicipital groove.  Range of motion is grossly normal with a positive pain in the plane of abduction.  Strength is 5/5 throughout.  There is some grinding.  Crossover test is negative.  Speeds and Yergason's are negative.  Michel Bickers is mildly positive on the left.  Neer testing is negative.  No instability.  Radiology: No results found.  Assessment and Plan:     ICD-10-CM   1. Acute pain of left shoulder  M25.512 XR Shoulder Left  2. Personal history of long-term (current) use of anticoagulants  Z92.29   3. Acute arthropathy  M12.9    Level of Medical Decision-Making in this case is Moderate.   Multifactorial acute shoulder pain.  Mild impingement with some arthritic changes.  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  I did a combined subacromial as well as intra-articular injection on the left shoulder.  Aspiration/Injection Procedure Note Danyal Monclova May 31, 1940 Date of procedure: 03/05/2019  Procedure: Large Joint Aspiration / Injection of Shoulder, Intraarticular, L Indications: Pain  Procedure Details Verbal consent was obtained from the patient. Risks including infection explained and contrasted with benefits and alternatives. Patient prepped with Chloraprep and Ethyl Chloride used for anesthesia. An intraarticular shoulder injection was performed using the posterior approach. The patient tolerated the procedure well and had decreased pain post injection. No complications. Injection: 4 cc of Lidocaine 1% and 1 mL Depo-Medrol 40 mg. Needle: 21 gauge, 2 inch Medication: Depo-Medrol 40 mg  Aspiration/Injection Procedure Note Gearldean Mccurty 1940-08-06 Date of procedure: 03/05/2019  Procedure: Large Joint Aspiration / Injection of Shoulder, Subacromial, L Indications: Pain  Procedure Details Verbal consent was obtained from the patient. Risks (including rare infection), benefits, and alternatives were explained. Patient prepped with Chloraprep and Ethyl Chloride used for anesthesia. The subacromial space was injected using the posterior approach. The patient tolerated the procedure well and had decreased pain post injection. No complications. Injection: 4 cc of Lidocaine 1% and 1 mL of Depo-Medrol 40 mg. Needle: 22 gauge, 1 1/2 inch Medication: Depo-Medrol 40 mg   Follow-up: No follow-ups on file.  No orders of the defined types were placed in this encounter.  Orders Placed This Encounter  Procedures  . XR Shoulder Left    Signed,  Frederico Hamman T. Keziah Drotar, MD   Outpatient Encounter Medications as of 03/05/2019  Medication Sig  . atorvastatin (LIPITOR) 40 MG tablet Take 1 tablet (40 mg total) by mouth daily at 6 PM.  . b complex vitamins tablet Take 1 tablet by  mouth daily.  . Cholecalciferol (VITAMIN D) 2000 units tablet Take 2,000 Units by mouth 3 (three) times a week.   Marland Kitchen ELIQUIS 5 MG TABS tablet TAKE 1 TABLET BY MOUTH TWICE A DAY  . Multiple Vitamin (MULTIVITAMIN WITH MINERALS) TABS tablet Take 1 tablet by mouth daily.  . potassium chloride SA (K-DUR,KLOR-CON) 20 MEQ tablet Take 1 tablet (20 mEq total) by mouth daily.  . valACYclovir (VALTREX) 500 MG tablet TAKE 1 TABLET (500 MG TOTAL) BY MOUTH 2 (TWO) TIMES DAILY.  . [DISCONTINUED] COMBIGAN 0.2-0.5 % ophthalmic solution INSTILL 1 DROP INTO BOTH EYES TWICE A DAY  . [DISCONTINUED] lactose free nutrition (BOOST) LIQD Take 237 mLs by mouth daily as needed.   . [DISCONTINUED] latanoprost (XALATAN) 0.005 % ophthalmic solution Place 1 drop into both eyes at bedtime.  . [DISCONTINUED] lidocaine-prilocaine (EMLA) cream Apply to affected area once (Patient taking differently: Apply to affected area once (near port))   No facility-administered encounter medications on file as of 03/05/2019.

## 2019-03-05 ENCOUNTER — Encounter: Payer: Self-pay | Admitting: Family Medicine

## 2019-03-05 ENCOUNTER — Ambulatory Visit (INDEPENDENT_AMBULATORY_CARE_PROVIDER_SITE_OTHER)
Admission: RE | Admit: 2019-03-05 | Discharge: 2019-03-05 | Disposition: A | Discharge status: Home / Self Care | Payer: Medicare Other | Source: Ambulatory Visit | Attending: Family Medicine | Admitting: Family Medicine

## 2019-03-05 ENCOUNTER — Other Ambulatory Visit: Payer: Self-pay

## 2019-03-05 ENCOUNTER — Ambulatory Visit (INDEPENDENT_AMBULATORY_CARE_PROVIDER_SITE_OTHER): Payer: Medicare Other | Admitting: Family Medicine

## 2019-03-05 VITALS — BP 124/70 | HR 75 | Temp 97.6°F | Ht 64.0 in | Wt 160.2 lb

## 2019-03-05 DIAGNOSIS — M25512 Pain in left shoulder: Secondary | ICD-10-CM

## 2019-03-05 DIAGNOSIS — M129 Arthropathy, unspecified: Secondary | ICD-10-CM

## 2019-03-05 DIAGNOSIS — Z9229 Personal history of other drug therapy: Secondary | ICD-10-CM

## 2019-03-05 MED ORDER — METHYLPREDNISOLONE ACETATE 40 MG/ML IJ SUSP
80.0000 mg | Freq: Once | INTRAMUSCULAR | Status: AC
  Administered 2019-03-05: 80 mg via INTRA_ARTICULAR

## 2019-03-05 NOTE — Addendum Note (Signed)
Addended by: Carter Kitten on: 03/05/2019 12:56 PM   Modules accepted: Orders

## 2019-04-02 ENCOUNTER — Ambulatory Visit
Admission: RE | Admit: 2019-04-02 | Discharge: 2019-04-02 | Disposition: A | Discharge status: Home / Self Care | Payer: Medicare Other | Source: Ambulatory Visit | Attending: Family Medicine | Admitting: Family Medicine

## 2019-04-02 ENCOUNTER — Other Ambulatory Visit: Payer: Self-pay

## 2019-04-02 DIAGNOSIS — M810 Age-related osteoporosis without current pathological fracture: Secondary | ICD-10-CM | POA: Diagnosis not present

## 2019-04-02 DIAGNOSIS — M8588 Other specified disorders of bone density and structure, other site: Secondary | ICD-10-CM | POA: Diagnosis not present

## 2019-04-02 DIAGNOSIS — E2839 Other primary ovarian failure: Secondary | ICD-10-CM

## 2019-04-02 DIAGNOSIS — Z780 Asymptomatic menopausal state: Secondary | ICD-10-CM | POA: Diagnosis not present

## 2019-04-03 ENCOUNTER — Telehealth: Payer: Self-pay | Admitting: *Deleted

## 2019-04-03 NOTE — Telephone Encounter (Signed)
Left VM requesting pt to call the office back regarding DEXA results

## 2019-04-04 NOTE — Telephone Encounter (Signed)
Patient returned call.  Please call patient back at [phone].

## 2019-04-04 NOTE — Telephone Encounter (Signed)
Left 2nd VM requesting pt to call the office back  

## 2019-04-06 MED ORDER — ALENDRONATE SODIUM 70 MG PO TABS: 70.0000 mg | ORAL_TABLET | ORAL | 11 refills | Status: DC

## 2019-04-06 NOTE — Telephone Encounter (Signed)
Patient has been notified of her DEXA scan results. Patient verbalized understanding. Patient is agreeable to starting Fosamax. She would like this sent in to [facility] on [address].

## 2019-04-06 NOTE — Telephone Encounter (Signed)
I sent it  Pay attention to directions re: how to take  If any problems or side effects stop it and let me know

## 2019-04-06 NOTE — Telephone Encounter (Signed)
Left VM letting pt know Dr. Tower's comments  

## 2019-04-30 NOTE — Progress Notes (Signed)
[place]   Telephone:([phone]) [phone] Fax:([phone]) [phone]   Clinic Follow up Note   Patient Care Team: Tower, [name], MD as PCP - General [name], MD as Consulting Physician (Hematology) [name], MD as Attending Physician (Gastroenterology) [name], MD as Consulting Physician (General Surgery)  I connected with [name] on 05/07/2019 at  9:00 AM EDT by telephone visit and verified that I am speaking with the correct person using two identifiers.  I discussed the limitations, risks, security and privacy concerns of performing an evaluation and management service by telephone and the availability of in person appointments. I also discussed with the patient that there may be a patient responsible charge related to this service. The patient expressed understanding and agreed to proceed.   Patient's location:  Her home  Provider's location:  My Office   CHIEF COMPLAINT: F/u of colon cancer  SUMMARY OF ONCOLOGIC HISTORY: Oncology History Overview Note  Cancer Staging Cancer of overlapping sites of colon [facility]) Staging form: Colon and Rectum, AJCC 8th Edition - Pathologic stage from 11/10/2016: Stage IIIB (pT3(m), pN1c, cM0) - Signed by [name], MD on 12/16/2016     Cancer of overlapping sites of colon [facility])  08/31/2016 Tumor Marker   Patient's tumor was tested for the following markers: CEA. Results of the tumor marker test revealed 2.0.   08/31/2016 Pathology Results   Colon biopsy Diagnosis: 1. Surgical [P], hepatic flexure. ADENOCARCINOMA. Moderately differentiated. 2. Surgical [P], transverse. ADENOCARCINOMA, Moderately differentiated.   08/31/2016 Initial Diagnosis   Colon cancer metastasized to liver ([place])   08/31/2016 Procedure   - 1) Polypoid mass at the hepatic flexure, 2cm, soft but clearly neoplastic.  - 2) Clearly malignant mass in the distal transverse colon; 4cm, firm, 1/2 cirumference, ulcerated. ' - 3) Four small (3-73m)  scattered typical appearing adenomas throughout the colon, notremoved since she mistakenly stayed on her plavix for this procedure.   09/01/2016 Imaging   CT Abdomen Pelvis W Contrast IMPRESSION: There are 2 colonic lesions involving the proximal ascending colon and proximal transverse colon, as detailed above, highly concerning for colonic neoplasm. In addition, there are at least 2 indeterminate liver lesions which are suspicious for potential metastatic disease.    09/16/2016 Imaging   Mr Liver W Wo Contrast IMPRESSION: Lesion within central right lobe of liver exhibits of peripheral enhancement and is suspicious for liver metastasis. Lesion within caudate lobe of liver identified on recent CT does not have a corresponding signal or enhancement abnormality on today's study. Within the inferior right lobe of liver there is an enhancing structure which is favored to represent an atypical benign hemangioma with liver metastasis felt less likely. The transverse colon lesion is again identified compatible with colonic adenocarcinoma.   10/04/2016 PET scan   PET 10/04/16 IMPRESSION: 1. Highly hypermetabolic proximal transverse colon mass, maximum SUV 30.8. Small focus of hypermetabolic activity in the mesentery just above this mass probably represents a local involved lymph node. 2. Moderately hypermetabolic ascending colon mass, maximum SUV 8.7. 3. Highly hypermetabolic enlarged portacaval lymph node, maximum SUV 16.7. 4. Focus of hypermetabolic activity near the duodenum bulb could be physiologic or due to an adjacent lymph node. 5. None of the liver lesions are discernibly hypermetabolic. Particularly the more cephalad right hepatic lobe lesion merits surveillance, it had nonspecific enhancement characteristics on prior cross-sectional imaging and only a thin enhancing wall which could conceivably predispose to false negative due to central necrosis. 6. Several tiny chronic pulmonary  nodules are  likely benign. 7. Other imaging findings of potential clinical significance: Aortic Atherosclerosis (ICD10-I70.0). Coronary atherosclerosis. Bilateral nonobstructive nephrolithiasis.   11/10/2016 Surgery   LAPAROSCOPIC EXTENDED RIGHT COLECTOMY by [name]    11/10/2016 Pathology Results   Diagnosis 11/10/16 Colon, segmental resection for tumor, Ascending and Transverse ADENOCARCINOMA OF THE ASCENDING COLON WITH EXTRA CELLULAR MUCIN, GRADE 3, SIZE 3.4 CM THE TUMOR INVADES MUSCULARIS PROPRIA POORLY DIFFERENTIATED ADENOCARCINOMA OF THE TRANSVERSE COLON, GRADE 4, SIZE 6.1 CM THE TUMOR INVADES THROUGH MUSCULARIS PROPRIA INTO PERICOLONIC SOFT TISSUE EXTRAMURAL SATELLITE TUMOR NODULES IS IDENTIFIED (X1) ALL MARGINS OF RESECTION ARE NEGATIVE FOR CARCINOMA METASTATIC CARCINOMA IN ONE OF SIXTY LYMPH NODES (1/60) TABULAR ADENOMA X1   12/28/2016 Imaging   MRI abdomen w/wo contrast IMPRESSION: Motion degraded images.   Two indeterminate right liver lesions measuring up to 1.6 cm, as described above. Continued attention on follow-up is suggested.   Two periportal lymph nodes measuring up to 1.9 cm, suspicious for nodal metastases.   Postsurgical changes related to right colon resection.     12/29/2016 - 04/11/2017 Chemotherapy   FOLFOX every 2 weeks starting 12/29/16 last cycle 7 on 04/11/17.    02/03/2017 Procedure   EUS Per [name] Endoscopic Finding Findings: The examined esophagus was endoscopically normal. The entire examined stomach was endoscopically normal. The examined duodenum was endoscopically normal. Endosonographic Finding (limited evaluation for tissue acquisition): 1. One enlarged lymph node (vs soft tissue mass) was visualized in the porta hepatis region. It measured 16 mm in maximal cross-sectional diameter. The node (vs soft tissue) was oval, isoechoic and had poorly defined margins. Fine needle aspiration for cytology was performed. Color Doppler imaging was  utilized prior to needle puncture to confirm a lack of significant vascular structures within the needle path. Threepasses were made with the 25 gauge needle using a transgastric approach. 2. CBD was normal, non-dilated 3. Limited views of the liver, spleen, pancreas were all normal.  Impression: One periportal soft tissue mass (lymphnode?) measuring 41m was noted and sampled with FNA.   02/03/2017 Pathology Results   Diagnosis FINE NEEDLE ASPIRATION, ENDOSCOPIC, PERI PORTAL NODE (SPECIMEN 1 OF 1 COLLECTED 02/03/17): NO MALIGNANT CELLS IDENTIFIED. SCANT LYMPHOID AND BENIGN GLANDULAR ELEMENTS.   03/09/2017 Imaging   IMPRESSION: Interval decrease in several tiny low-attenuation liver lesions the, consistent with improving hepatic metastases.   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  TomorNext Lynch+ cancer Next was ordered throught he laboratory [facility]  TumorNext Lynch with CancerNext: Paired Germline and Tumor Analyses for EnhancedDiagnosis of Lynch Syndrome plus Analyses of 29 Additional Genes Associated with Hereditary Cancer.  Germline Genes analyzed: MLH1, MSH2, MSH6, PMS2, APC, ATM, BARD1, BMPR1A, BRIP1, CDH1, CDKN2A, CHEK2, DICER1, MRE11A, MUTYH, NBN, PALB2, PTEN, RAD50, RAD51C, RAD51D, SMAD4, STK11, TP53, CDK4, NF1, BRCA1, BRCA2, POLD1, POLE, SMARCA4, HOXB13 (sequencing and deletion/duplication); EPCAM, GREM1 (deletion/duplication only).  Results: Germline- Negative, no variants detected.  Tumor/Somatic-  BRAF V600E detected, No variants in KRAS or NRAS detected.  MLH1 promoter hypermethylation present, Microsatellite High.   Somatic pathogenic mutations in MSH2 c.687delA and pathogenic mutation in MSH6 c.3261dupC.  2 Somatic Variants of uncertain  significance in MSH2 called p.A123T and p.D654N.   The date of this test report is 04/07/2017.  04/26/2017 - 06/12/2017 Chemotherapy   Xeloda 2053m in the AM and 15031min the PM started on 04/26/17. Changed to 150036mID on 05/16/17   06/13/2017 Imaging    CT CAP W Contrast 06/13/17 IMPRESSION: 1. Continued regression of hepatic metastatic disease. No new or progressive findings. 2. Surgical changes from a right hemicolectomy. No findings for recurrent cancer. No mesenteric or retroperitoneal mass or Adenopathy.   09/19/2017 Imaging   CT AP W Contrast 09/19/17  IMPRESSION: 1. The inferior right lobe of liver lesion appears slightly increased in size when compared with the previous exam. 2. No new liver lesions identified. 3. Stable appearance of the colon status post right hemicolectomy. 4.  Aortic Atherosclerosis (ICD10-I70.0).     10/01/2017 Imaging   MRI Abdomen W WO Contrast IMPRESSION: 1. Two small, subtle liver lesions are again identified involving segment 7 and segment 6. When compared with the MRI from 12/28/2016 these are not significantly changed. Compared with the MRI from 09/15/2016 the peripherally enhancing lesion within segment 7 has decreased in size in the interval. There has been no significant change in the segment 6 lesion. 2. No new foci of suspected metastasis identified.   03/29/2018 Imaging   MRI Abdomen  IMPRESSION: 1. The 2 small subtle liver lesions involving segment 7 and segment 6 are again noted. When compared with 10/01/2017 these are stable to minimally decreased in size. No new liver lesion identified. 2.  Aortic Atherosclerosis (ICD10-I70.0).     03/30/2018 Imaging   CT chest  IMPRESSION: 1. Solitary new solid pulmonary nodule in the anterior right middle lobe measuring 4 mm. Recommend attention on follow-up chest CT in 3 months. 2. No additional potential findings of metastatic disease in he chest. 3. Nonobstructing left  nephrolithiasis. 4. One vessel coronary atherosclerosis.   Aortic Atherosclerosis (ICD10-I70.0).     05/03/2019 Imaging   CT CAP W Contrast  IMPRESSION: 1. Stable exam. No new or progressive findings to suggest recurrent or metastatic disease in the chest, abdomen, or pelvis. 2. Stable tiny bilateral pulmonary nodules, likely benign. 3. 18 mm inferior right liver lesion seen on prior studies is more subtle on today's exam and measures smaller. A second tiny lesion described on prior studies towards the dome of the liver is inapparent on today's exam 4. Stable small low-density lesions in the right kidney, likely cysts. Continued attention on follow-up recommended. 5. Aortic Atherosclerosis (ICD10-I70.0).      CURRENT THERAPY:  Observation  INTERVAL HISTORY:  [place] for a virtual visit today for follow up of her colon cancer. She notes she is dong well. She notes she remains active at home and with Horse back riding. She will have left abdominal pain and left foot pain occasionally. She has been seen by podiatrist before without much help so she is no longer going. She is interested in another podiatrist. She denies persistent nausea or abdominal issues.     REVIEW OF SYSTEMS:   Constitutional: Denies fevers, chills or abnormal weight loss Eyes: Denies blurriness of vision Ears, nose, mouth, throat, and face: Denies mucositis or sore throat Respiratory: Denies cough, dyspnea or wheezes Cardiovascular: Denies palpitation, chest discomfort or lower extremity swelling Gastrointestinal:  Denies nausea, heartburn or change in bowel habits Skin: Denies abnormal skin rashes MSK: (+) left foot pain.  Lymphatics: Denies new lymphadenopathy or easy bruising Neurological:Denies numbness, tingling or new weaknesses Behavioral/Psych: Mood is stable, no new changes  All other systems were reviewed with the patient and  are negative.  MEDICAL HISTORY:  Past  Medical History:  Diagnosis Date   Anemia    Arthritis    Astigmatism    both eyes   Cancer ([place])    colon surgery done last chemo tx 01-17-17   Cataract    Colon cancer [facility])    Coronary artery disease, non-occlusive    Dyspnea    more so exertion   Dysrhythmia    skipped beats every now and then   GERD (gastroesophageal reflux disease)    Glaucoma    both eyes right eye worse than left   Hammer toes of both feet    Headache    migraines with aura   Heel spur    both heels and front   HSV (herpes simplex virus) infection    Hyperglycemia    denies   Hyperlipidemia    IBS (irritable bowel syndrome)    IC (interstitial cystitis)    Insomnia disorder related to known organic factor    Osteoporosis    PONV (postoperative nausea and vomiting)    has had more surgeries, and had no problems   Stroke ([place]) 2016   Vitamin D deficiency     SURGICAL HISTORY: Past Surgical History:  Procedure Laterality Date   ABDOMINAL HYSTERECTOMY     1 ovary and womb removed   CARDIAC CATHETERIZATION  7/12   non obst dz -- not sure what yr   CHOLECYSTECTOMY     EUS N/A 02/03/2017   Procedure: UPPER ENDOSCOPIC ULTRASOUND (EUS) LINEAR;  Surgeon: [name], MD;  Location: [facility];  Service: Endoscopy;  Laterality: N/A;   EYE SURGERY Bilateral    ioc for cataracts    LAPAROSCOPIC RIGHT COLECTOMY Right 11/10/2016   Procedure: LAPAROSCOPIC EXTENDED RIGHT COLECTOMY;  Surgeon: [name], MD;  Location: [facility];  Service: General;  Laterality: Right;   PORTACATH PLACEMENT Left 12/22/2016   Procedure: INSERTION PORT-A-CATH;  Surgeon: [name], MD;  Location: [facility];  Service: General;  Laterality: Left;   ROTATOR CUFF REPAIR Bilateral     I have reviewed the social history and family history with the patient and they are unchanged from previous note.  ALLERGIES:  is allergic to salmon [fish allergy]; ampicillin; codeine; flagyl [metronidazole]; meperidine  hcl; neomycin; propoxyphene hcl; sulfa antibiotics; and tramadol hcl.  MEDICATIONS:  Current Outpatient Medications  Medication Sig Dispense Refill   alendronate (FOSAMAX) 70 MG tablet Take 1 tablet (70 mg total) by mouth every 7 (seven) days. Take with a full glass of water on an empty stomach. 4 tablet 11   atorvastatin (LIPITOR) 40 MG tablet Take 1 tablet (40 mg total) by mouth daily at 6 PM. 90 tablet 3   b complex vitamins tablet Take 1 tablet by mouth daily.     Cholecalciferol (VITAMIN D) 2000 units tablet Take 2,000 Units by mouth 3 (three) times a week.      ELIQUIS 5 MG TABS tablet TAKE 1 TABLET BY MOUTH TWICE A DAY 60 tablet 0   Multiple Vitamin (MULTIVITAMIN WITH MINERALS) TABS tablet Take 1 tablet by mouth daily.     potassium chloride SA (K-DUR,KLOR-CON) 20 MEQ tablet Take 1 tablet (20 mEq total) by mouth daily. 90 tablet 3   valACYclovir (VALTREX) 500 MG tablet TAKE 1 TABLET (500 MG TOTAL) BY MOUTH 2 (TWO) TIMES DAILY. 6 tablet 0   No current facility-administered medications for this visit.    PHYSICAL EXAMINATION: ECOG PERFORMANCE STATUS: 0 - Asymptomatic  No vitals taken today, Exam not performed today  LABORATORY DATA:  I have reviewed the data as listed CBC Latest Ref Rng & Units 05/03/2019 02/20/2019 08/29/2018  WBC 4.0 - 10.5 K/uL 6.4 7.3 8.4  Hemoglobin 12.0 - 15.0 g/dL 13.1 12.9 13.3  Hematocrit 36.0 - 46.0 % 41.0 39.1 41.1  Platelets 150 - 400 K/uL 268 257.0 236     CMP Latest Ref Rng & Units 05/03/2019 02/20/2019 08/29/2018  Glucose 70 - 99 mg/dL 87 86 87  BUN 8 - 23 mg/dL '17 21 19  ' Creatinine 0.44 - 1.00 mg/dL 0.93 0.87 1.16(H)  Sodium 135 - 145 mmol/L 138 140 142  Potassium 3.5 - 5.1 mmol/L 3.5 4.0 4.0  Chloride 98 - 111 mmol/L 104 107 107  CO2 22 - 32 mmol/L '28 26 27  ' Calcium 8.9 - 10.3 mg/dL 8.8(L) 9.4 9.2  Total Protein 6.5 - 8.1 g/dL 6.9 6.7 7.0  Total Bilirubin 0.3 - 1.2 mg/dL 0.9 0.5 0.5  Alkaline Phos 38 - 126 U/L 90 68 93  AST 15 - 41 U/L '20  20 17  ' ALT 0 - 44 U/L '18 17 18      ' RADIOGRAPHIC STUDIES: I have personally reviewed the radiological images as listed and agreed with the findings in the report. No results found.   ASSESSMENT & PLAN:  [name] is a 79 y.o. female with    1. Synchronized colon adenocarcinoma, moderately differentiated, in hepatic flexure and distal transverse colon, pT3(m)N1cMx with indeterminate liver lesions, at least stage IIIB, MSI-H -She was diagnosed in 08/31/16. She is s/p right colectomy, adjuvant FOLFOX which was changed to Xeloda after cycle 6 due to poor tolerance.  -Initial staging scan showed 2 liver lesions, suspicious for liver metastasis, but not hypermetabolic on PET. The liver lesion has decreased in size after adjuvant chemotherapy. -She is currently on observation -We discussed her CT CAP from 05/03/19 which shows stable exam with no new or progressive findings to suggest recurrent or metastatic disease.  -She notes she is doing well. Labs from last week reviewed, CBC and CMP WNL except Ca 8.8. There is no concern for cancer recurrence at this point. -I encouraged her to continue calcium and Vitamin D. She can take oral potassium 1-2 times a week to maintain.  -She is 2.5 years from diagnosis. Her risk of recurrence decreases the farther she is from diagnosis. Continue 5 year surveillance plan.  -F/u in 6 months    2. Genetics was negative   3. Liver lesions  -Previous biopsy of the enlarged portal node was negative for metastasis, but also appear to shrink on chemotherapy based on repeat scans.  -Dr. Bethel Born feels her liver lesions are difficult to biopsy, and overall stable. -Her last CT chest from 06/26/18 shows unremarkable upper abdomen. -05/03/19 CT CAP show liver lesions smaller, the smaller lesion is not seen on scan.     4. History of stroke  -she is on aspirin and Plavix, will continue.   5. HTN  -f/u with PCP   6. Portal vein and SMV thrombosis  03/09/2017 -Given high co-pay of $45-47 she previously was taking Eliquis once daily. I encourage her to change to bid   -I discussed due to Afib, and h/o of stroke, I strongly encouraged her to continue treatment. I previously provided her with a discount card for medicine.   -Continue Eliquis 57m BID for AF indefinitely if no contraindications, tolerating well.    7. Atrial fibrillation -on Eliquis -  continue monitoring    8. Hip pain, left foot pain  -Likely arthritis -I encouraged her to follow-up with PCP -She also has left foot pain. She is interested in seeing another podiatrist.    Plan:  -CT scan reviewed, NED  -Lab and F/u in 6 months      No problem-specific Assessment & Plan notes found for this encounter.   No orders of the defined types were placed in this encounter.  I discussed the assessment and treatment plan with the patient. The patient was provided an opportunity to ask questions and all were answered. The patient agreed with the plan and demonstrated an understanding of the instructions.  The patient was advised to call back or seek an in-person evaluation if the symptoms worsen or if the condition fails to improve as anticipated.  The total time spent in the appointment was 22 minutes.     [name], MD 05/07/2019   I, Joslyn Devon, am acting as scribe for [name], MD.   I have reviewed the above documentation for accuracy and completeness, and I agree with the above.

## 2019-05-03 ENCOUNTER — Other Ambulatory Visit: Payer: Self-pay

## 2019-05-03 ENCOUNTER — Ambulatory Visit (HOSPITAL_COMMUNITY)
Admission: RE | Admit: 2019-05-03 | Discharge: 2019-05-03 | Disposition: A | Discharge status: Home / Self Care | Payer: Medicare Other | Source: Ambulatory Visit | Attending: Hematology | Admitting: Hematology

## 2019-05-03 ENCOUNTER — Inpatient Hospital Stay: Payer: Medicare Other | Attending: Hematology

## 2019-05-03 DIAGNOSIS — Z86718 Personal history of other venous thrombosis and embolism: Secondary | ICD-10-CM | POA: Diagnosis not present

## 2019-05-03 DIAGNOSIS — C188 Malignant neoplasm of overlapping sites of colon: Secondary | ICD-10-CM | POA: Insufficient documentation

## 2019-05-03 DIAGNOSIS — Z8673 Personal history of transient ischemic attack (TIA), and cerebral infarction without residual deficits: Secondary | ICD-10-CM | POA: Diagnosis not present

## 2019-05-03 DIAGNOSIS — I10 Essential (primary) hypertension: Secondary | ICD-10-CM | POA: Diagnosis not present

## 2019-05-03 DIAGNOSIS — K7689 Other specified diseases of liver: Secondary | ICD-10-CM | POA: Insufficient documentation

## 2019-05-03 DIAGNOSIS — Z85038 Personal history of other malignant neoplasm of large intestine: Secondary | ICD-10-CM | POA: Insufficient documentation

## 2019-05-03 DIAGNOSIS — D500 Iron deficiency anemia secondary to blood loss (chronic): Secondary | ICD-10-CM

## 2019-05-03 DIAGNOSIS — I4891 Unspecified atrial fibrillation: Secondary | ICD-10-CM | POA: Insufficient documentation

## 2019-05-03 DIAGNOSIS — C787 Secondary malignant neoplasm of liver and intrahepatic bile duct: Secondary | ICD-10-CM

## 2019-05-03 DIAGNOSIS — C189 Malignant neoplasm of colon, unspecified: Secondary | ICD-10-CM

## 2019-05-03 DIAGNOSIS — Z7901 Long term (current) use of anticoagulants: Secondary | ICD-10-CM | POA: Diagnosis not present

## 2019-05-03 LAB — COMPREHENSIVE METABOLIC PANEL
ALT: 18 U/L (ref 0–44)
AST: 20 U/L (ref 15–41)
Albumin: 3.9 g/dL (ref 3.5–5.0)
Alkaline Phosphatase: 90 U/L (ref 38–126)
Anion gap: 6 (ref 5–15)
BUN: 17 mg/dL (ref 8–23)
CO2: 28 mmol/L (ref 22–32)
Calcium: 8.8 mg/dL — ABNORMAL LOW (ref 8.9–10.3)
Chloride: 104 mmol/L (ref 98–111)
Creatinine, Ser: 0.93 mg/dL (ref 0.44–1.00)
GFR calc Af Amer: >60 mL/min (ref >60)
GFR calc non Af Amer: 58 mL/min — ABNORMAL LOW (ref >60)
Glucose, Bld: 87 mg/dL (ref 70–99)
Potassium: 3.5 mmol/L (ref 3.5–5.1)
Sodium: 138 mmol/L (ref 135–145)
Total Bilirubin: 0.9 mg/dL (ref 0.3–1.2)
Total Protein: 6.9 g/dL (ref 6.5–8.1)

## 2019-05-03 LAB — CBC WITH DIFFERENTIAL (CANCER CENTER ONLY)
Abs Immature Granulocytes: 0.01 10*3/uL (ref 0.00–0.07)
Basophils Absolute: 0 10*3/uL (ref 0.0–0.1)
Basophils Relative: 1 %
Eosinophils Absolute: 0.1 10*3/uL (ref 0.0–0.5)
Eosinophils Relative: 2 %
HCT: 41 % (ref 36.0–46.0)
Hemoglobin: 13.1 g/dL (ref 12.0–15.0)
Immature Granulocytes: 0 %
Lymphocytes Relative: 29 %
Lymphs Abs: 1.8 10*3/uL (ref 0.7–4.0)
MCH: 30.2 pg (ref 26.0–34.0)
MCHC: 32 g/dL (ref 30.0–36.0)
MCV: 94.5 fL (ref 80.0–100.0)
Monocytes Absolute: 0.7 10*3/uL (ref 0.1–1.0)
Monocytes Relative: 10 %
Neutro Abs: 3.8 10*3/uL (ref 1.7–7.7)
Neutrophils Relative %: 58 %
Platelet Count: 268 10*3/uL (ref 150–400)
RBC: 4.34 MIL/uL (ref 3.87–5.11)
RDW: 13.2 % (ref 11.5–15.5)
WBC Count: 6.4 10*3/uL (ref 4.0–10.5)
nRBC: 0 % (ref 0.0–0.2)

## 2019-05-03 LAB — IRON AND TIBC
Iron: 83 ug/dL (ref 41–142)
Saturation Ratios: 24 % (ref 21–57)
TIBC: 349 ug/dL (ref 236–444)
UIBC: 266 ug/dL (ref 120–384)

## 2019-05-03 LAB — RETICULOCYTES
Immature Retic Fract: 11.1 % (ref 2.3–15.9)
RBC.: 4.43 MIL/uL (ref 3.87–5.11)
Retic Count, Absolute: 63.3 10*3/uL (ref 19.0–186.0)
Retic Ct Pct: 1.4 % (ref 0.4–3.1)

## 2019-05-03 LAB — FERRITIN: Ferritin: 24 ng/mL (ref 11–307)

## 2019-05-03 LAB — CEA (IN HOUSE-CHCC): CEA (CHCC-In House): 1.97 ng/mL (ref 0.00–5.00)

## 2019-05-03 MED ORDER — SODIUM CHLORIDE (PF) 0.9 % IJ SOLN
INTRAMUSCULAR | Status: AC
  Filled 2019-05-03: qty 50

## 2019-05-03 MED ORDER — IOHEXOL 9 MG/ML PO SOLN
ORAL | Status: AC
  Filled 2019-05-03: qty 1000

## 2019-05-03 MED ORDER — IOHEXOL 300 MG/ML  SOLN
100.0000 mL | Freq: Once | INTRAMUSCULAR | Status: AC | PRN
  Administered 2019-05-03: 100 mL via INTRAVENOUS

## 2019-05-03 MED ORDER — IOHEXOL 9 MG/ML PO SOLN
500.0000 mL | ORAL | Status: AC
  Administered 2019-05-03 (×2): 500 mL via ORAL

## 2019-05-04 ENCOUNTER — Other Ambulatory Visit: Payer: Self-pay | Admitting: Hematology

## 2019-05-07 ENCOUNTER — Telehealth: Payer: Self-pay

## 2019-05-07 ENCOUNTER — Inpatient Hospital Stay (HOSPITAL_BASED_OUTPATIENT_CLINIC_OR_DEPARTMENT_OTHER): Payer: Medicare Other | Admitting: Hematology

## 2019-05-07 ENCOUNTER — Ambulatory Visit: Payer: Medicare Other | Admitting: Hematology

## 2019-05-07 DIAGNOSIS — C188 Malignant neoplasm of overlapping sites of colon: Secondary | ICD-10-CM | POA: Diagnosis not present

## 2019-05-07 DIAGNOSIS — I10 Essential (primary) hypertension: Secondary | ICD-10-CM

## 2019-05-07 NOTE — Telephone Encounter (Signed)
VM left for Ms Peron to call office.  Stating she missed her appt and Dr. Burr Medico would like to speak to her on phone visit at 1200.

## 2019-05-18 ENCOUNTER — Ambulatory Visit: Payer: Medicare Other | Admitting: Hematology

## 2019-06-07 ENCOUNTER — Other Ambulatory Visit: Payer: Self-pay | Admitting: Hematology

## 2019-07-15 ENCOUNTER — Other Ambulatory Visit: Payer: Self-pay | Admitting: Hematology

## 2019-07-31 ENCOUNTER — Telehealth: Payer: Self-pay

## 2019-07-31 NOTE — Telephone Encounter (Signed)
[name] called stating she is having pain in her shoulder on the same side as her port.  She has had steroid injections that have helped. She feels that the port is contributing to the pain and wants to have it removed.

## 2019-08-01 NOTE — Telephone Encounter (Signed)
I spoke with MS Selvey and let her know Dr. Burr Medico is fine with her getting her port removed.  I provided Dr. Marlowe Aschoff number.

## 2019-11-03 ENCOUNTER — Other Ambulatory Visit (HOSPITAL_COMMUNITY): Payer: Medicare Other

## 2019-11-15 ENCOUNTER — Other Ambulatory Visit: Payer: Self-pay

## 2019-11-15 ENCOUNTER — Encounter: Payer: Self-pay | Admitting: Family Medicine

## 2019-11-15 ENCOUNTER — Telehealth: Payer: Self-pay | Admitting: Hematology

## 2019-11-15 ENCOUNTER — Ambulatory Visit: Payer: Medicare Other | Admitting: Family Medicine

## 2019-11-15 ENCOUNTER — Other Ambulatory Visit: Payer: Self-pay | Admitting: Hematology

## 2019-11-15 VITALS — BP 138/86 | HR 85 | Temp 97.6°F | Ht 64.0 in | Wt 155.0 lb

## 2019-11-15 DIAGNOSIS — H9202 Otalgia, left ear: Secondary | ICD-10-CM | POA: Insufficient documentation

## 2019-11-15 DIAGNOSIS — S8002XA Contusion of left knee, initial encounter: Secondary | ICD-10-CM | POA: Diagnosis not present

## 2019-11-15 DIAGNOSIS — M25511 Pain in right shoulder: Secondary | ICD-10-CM | POA: Insufficient documentation

## 2019-11-15 DIAGNOSIS — M26609 Unspecified temporomandibular joint disorder, unspecified side: Secondary | ICD-10-CM | POA: Insufficient documentation

## 2019-11-15 DIAGNOSIS — Z23 Encounter for immunization: Secondary | ICD-10-CM

## 2019-11-15 DIAGNOSIS — W19XXXA Unspecified fall, initial encounter: Secondary | ICD-10-CM | POA: Diagnosis not present

## 2019-11-15 DIAGNOSIS — S8000XA Contusion of unspecified knee, initial encounter: Secondary | ICD-10-CM | POA: Insufficient documentation

## 2019-11-15 MED ORDER — DOXYCYCLINE HYCLATE 100 MG PO TABS: 100.0000 mg | ORAL_TABLET | Freq: Two times a day (BID) | ORAL | 0 refills | Status: DC

## 2019-11-15 NOTE — Assessment & Plan Note (Addendum)
Bruise with very slight tenderness Nl rom and no deformity of pain  Recommended ice  Update if not starting to improve in a week or if worsening

## 2019-11-15 NOTE — Assessment & Plan Note (Signed)
Pain in L reproducible on exam  Started after dental work possibly due to having mouth open  Handout given  Enc ice/heat and joint rest  Soft foods/use straw  F/u with dentist if no imp

## 2019-11-15 NOTE — Patient Instructions (Signed)
Keep ice on shoulder and keep doing the range of motion exercises  If no further improvement in a week we can get you back to orthopedics (let me know)   Knee is bruised and should improve  Finish course of doxycycline for ear pain   You may have TMJ on the left  Stick to soft foods  Heat/ice will help  No gum  Avoid excessive chewing  See the dentist if needed (if no improvement)

## 2019-11-15 NOTE — Progress Notes (Signed)
Subjective:    Patient ID: [name], female    DOB: 02/17/1941, 79 y.o.   MRN: [id number]  This visit occurred during the SARS-CoV-2 public health emergency.  Safety protocols were in place, including screening questions prior to the visit, additional usage of staff PPE, and extensive cleaning of exam room while observing appropriate contact time as indicated for disinfecting solutions.    HPI Pt presents with c/o fall   Wt Readings from Last 3 Encounters:  11/15/19 155 lb (70.3 kg)  03/05/19 160 lb 4 oz (72.7 kg)  02/20/19 160 lb 8 oz (72.8 kg)   26.61 kg/m   Lost her husband recently  He suffered- a relief overall  She is ok emotionally   3 weeks ago a horse stepped on edge of shoe and she fell on R side when she tried to step forward  She fell on her R shoulder and side of face (was bruised up) Has had rotator cuff surgery on R in the past  Her ear is bothering her also  On L side  Jaw issues also-then had some dental work (root canal) and also pulled a tooth  Hurts to chew (? TMJ) Ear started before the jaw hurt   Hard to abduct R shoulder-pain in the front and top  She has had ear infections in the past   No sinus pain or pressure   Taking acetaminophen for pain  Has tried some ice on her shoulder and also L side of her face   She took 3 of former husband's doxycycline  ? If OM that is better   Patient Active Problem List   Diagnosis Date Noted  . Right shoulder pain 11/15/2019  . Contusion, knee 11/15/2019  . Left ear pain 11/15/2019  . 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 Fall 10/28/2017  . Abnormality of breast on screening mammography 08/01/2017  . Genetic testing 04/25/2017  . Adenopathy   . History of DVT (deep vein thrombosis) 11/24/2016  . Cancer of overlapping sites of colon ([place]) 09/23/2016  . Estrogen deficiency 07/30/2016  . Screening mammogram, encounter for 07/30/2016  . Routine general  medical examination at a health care facility 07/27/2016  . H/O: CVA (cerebrovascular accident) 05/06/2015  . HTN (hypertension) 05/06/2015  . HLD (hyperlipidemia) 05/06/2015  . GERD (gastroesophageal reflux disease) 05/06/2015  . CAD (coronary artery disease) 05/06/2015  . Seborrheic keratosis 07/05/2012  . 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 Callus of foot 10/16/2010  . Coronary artery disease, non-occlusive 10/16/2010  . Insomnia 02/02/2010  . HAND PAIN, BILATERAL 01/09/2010  . Prediabetes 07/29/2009  . ALLERGIC RHINITIS 12/04/2008  . Vitamin D deficiency 04/30/2008  . COLONIC POLYPS 06/28/2007  . IBS 03/17/2007  . INTERSTITIAL CYSTITIS 03/17/2007  . Osteoporosis 03/17/2007   Past Medical History:  Diagnosis Date  . Anemia   . Arthritis   . Astigmatism    both eyes  . Cancer [facility])    colon surgery done last chemo tx 01-17-17  . Cataract   . Colon cancer ([place])   . Coronary artery disease, non-occlusive   . Dyspnea    more so exertion  . Dysrhythmia    skipped beats every now and then  . GERD (gastroesophageal reflux disease)   . Glaucoma    both eyes right eye worse than left  . Hammer toes of both feet   . Headache    migraines with aura  . Heel spur  both heels and front  . HSV (herpes simplex virus) infection   . Hyperglycemia    denies  . Hyperlipidemia   . IBS (irritable bowel syndrome)   . IC (interstitial cystitis)   . Insomnia disorder related to known organic factor   . Osteoporosis   . PONV (postoperative nausea and vomiting)    has had more surgeries, and had no problems  . Stroke ([place]) 2016  . Vitamin D deficiency    Past Surgical History:  Procedure Laterality Date  . ABDOMINAL HYSTERECTOMY     1 ovary and womb removed  . CARDIAC CATHETERIZATION  7/12   non obst dz -- not sure what yr  . CHOLECYSTECTOMY    . EUS N/A 02/03/2017   Procedure: UPPER ENDOSCOPIC ULTRASOUND (EUS) LINEAR;  Surgeon: [name], MD;  Location: [facility];   Service: Endoscopy;  Laterality: N/A;  . EYE SURGERY Bilateral    ioc for cataracts   . LAPAROSCOPIC RIGHT COLECTOMY Right 11/10/2016   Procedure: LAPAROSCOPIC EXTENDED RIGHT COLECTOMY;  Surgeon: [name], MD;  Location: [place];  Service: General;  Laterality: Right;  . PORTACATH PLACEMENT Left 12/22/2016   Procedure: INSERTION PORT-A-CATH;  Surgeon: [name], MD;  Location: [place];  Service: General;  Laterality: Left;  . ROTATOR CUFF REPAIR Bilateral    Social History   Tobacco Use  . Smoking status: Never Smoker  . Smokeless tobacco: Never Used  Vaping Use  . Vaping Use: Never used  Substance Use Topics  . Alcohol use: No    Alcohol/week: 0.0 standard drinks  . Drug use: No   Family History  Problem Relation Age of Onset  . Alcohol abuse Mother   . Heart attack Mother   . GER disease Mother   . GER disease Father   . Alcohol abuse Father   . Stroke Father   . Tuberculosis Father   . Heart attack Father   . Aneurysm Father   . Mental illness Sister   . Depression Sister   . Breast cancer Neg Hx    Allergies  Allergen Reactions  . Salmon [Fish Allergy] Anaphylaxis    This happens with "CANNED SALMON" only, if eaten uncooked  . Ampicillin Nausea Only    Has patient had a PCN reaction causing immediate rash, facial/tongue/throat swelling, SOB or lightheadedness with hypotension: No Has patient had a PCN reaction causing severe rash involving mucus membranes or skin necrosis: No Has patient had a PCN reaction that required hospitalization: No Has patient had a PCN reaction occurring within the last 10 years: No If all of the above answers are "NO", then may proceed with Cephalosporin use.   . Codeine Nausea And Vomiting  . Flagyl [Metronidazole] Nausea And Vomiting    Patient was taking this along with Neomycin and could not differentiate which med triggered the nausea & vomiting  . Meperidine Hcl Nausea And Vomiting  . Neomycin Nausea And  Vomiting    Patient was taking this along with Flagyl and could not differentiate which med triggered the nausea & vomiting  . Propoxyphene Hcl Nausea And Vomiting  . Sulfa Antibiotics Hives  . Tramadol Hcl Nausea And Vomiting   Current Outpatient Medications on File Prior to Visit  Medication Sig Dispense Refill  . Ascorbic Acid (VITAMIN C PO) Take by mouth.    Marland Kitchen atorvastatin (LIPITOR) 40 MG tablet Take 1 tablet (40 mg total) by mouth daily at 6 PM. 90 tablet 3  . b  complex vitamins tablet Take 1 tablet by mouth daily.    . Cholecalciferol (VITAMIN D) 2000 units tablet Take 2,000 Units by mouth 3 (three) times a week.     . valACYclovir (VALTREX) 500 MG tablet TAKE 1 TABLET (500 MG TOTAL) BY MOUTH 2 (TWO) TIMES DAILY. 6 tablet 0  . alendronate (FOSAMAX) 70 MG tablet Take 1 tablet (70 mg total) by mouth every 7 (seven) days. Take with a full glass of water on an empty stomach. (Patient not taking: Reported on 11/15/2019) 4 tablet 11  . Multiple Vitamin (MULTIVITAMIN WITH MINERALS) TABS tablet Take 1 tablet by mouth daily. (Patient not taking: Reported on 11/15/2019)    . potassium chloride SA (K-DUR,KLOR-CON) 20 MEQ tablet Take 1 tablet (20 mEq total) by mouth daily. (Patient not taking: Reported on 11/15/2019) 90 tablet 3   No current facility-administered medications on file prior to visit.    Review of Systems  Constitutional: Negative for activity change, appetite change, fatigue, fever and unexpected weight change.  HENT: Negative for congestion, ear pain, rhinorrhea, sinus pressure and sore throat.   Eyes: Negative for pain, redness and visual disturbance.  Respiratory: Negative for cough, shortness of breath and wheezing.   Cardiovascular: Negative for chest pain and palpitations.  Gastrointestinal: Negative for abdominal pain, blood in stool, constipation and diarrhea.  Endocrine: Negative for polydipsia and polyuria.  Genitourinary: Negative for dysuria, frequency and urgency.    Musculoskeletal: Positive for arthralgias and back pain. Negative for joint swelling, myalgias and neck pain.  Skin: Negative for pallor and rash.  Allergic/Immunologic: Negative for environmental allergies.  Neurological: Negative for dizziness, syncope and headaches.  Hematological: Negative for adenopathy. Does not bruise/bleed easily.  Psychiatric/Behavioral: Negative for decreased concentration and dysphoric mood. The patient is not nervous/anxious.        Grief        Objective:   Physical Exam Constitutional:      General: She is not in acute distress.    Appearance: She is well-developed and normal weight. She is not ill-appearing or diaphoretic.  HENT:     Head: Normocephalic and atraumatic.     Comments: Tender over L TM joint with mild clicking    Right Ear: Tympanic membrane and ear canal normal.     Left Ear: Tympanic membrane and ear canal normal.     Ears:     Comments: Scant cerumen bilat    Nose: Nose normal.     Mouth/Throat:     Mouth: Mucous membranes are moist.     Pharynx: Oropharynx is clear. No oropharyngeal exudate or posterior oropharyngeal erythema.  Eyes:     General: No scleral icterus.    Conjunctiva/sclera: Conjunctivae normal.     Pupils: Pupils are equal, round, and reactive to light.  Neck:     Thyroid: No thyromegaly.     Vascular: No carotid bruit or JVD.  Cardiovascular:     Rate and Rhythm: Normal rate and regular rhythm.     Heart sounds: Normal heart sounds. No gallop.   Pulmonary:     Effort: Pulmonary effort is normal. No respiratory distress.     Breath sounds: Normal breath sounds. No wheezing or rales.  Abdominal:     General: Bowel sounds are normal. There is no distension or abdominal bruit.     Palpations: Abdomen is soft. There is no mass.     Tenderness: There is no abdominal tenderness.  Musculoskeletal:     Right shoulder: Tenderness present. No  swelling, deformity, effusion, laceration or crepitus. Decreased range of  motion. Normal strength. Normal pulse.     Cervical back: Normal range of motion and neck supple. No tenderness.     Left knee: Ecchymosis present. No swelling, deformity, effusion or erythema.     Comments: Mild ecchymosis of L knee with nl rom and no pain  R shoulder- limited abduction and int/ext rotation due to pain  Lymphadenopathy:     Cervical: No cervical adenopathy.  Skin:    General: Skin is warm and dry.     Findings: No erythema or rash.  Neurological:     Mental Status: She is alert.     Cranial Nerves: No cranial nerve deficit.     Sensory: No sensory deficit.     Coordination: Coordination normal.     Deep Tendon Reflexes: Reflexes are normal and symmetric. Reflexes normal.  Psychiatric:        Mood and Affect: Mood normal.     Comments: Talks candidly about loss of husband           Assessment & Plan:   Problem List Items Addressed This Visit      Musculoskeletal and Integument   TMJ dysfunction    Pain in L reproducible on exam  Started after dental work possibly due to having mouth open  Handout given  Enc ice/heat and joint rest  Soft foods/use straw  F/u with dentist if no imp        Other   Fall - Primary    Involving horse in stable  Hit R shoulder (already had rot cuff surg) and some pain with limited rom  L knee- contused   No concern for fracture at this time Discussed fall prevention in detail       Right shoulder pain    After fall upon it (prev rot cuff surg)  Limited rom  Using ice  Enc rom exercises  No deformity-reassuring exam  Plan to ice and do exercises then update in a week  Will ref to ortho if needed      Contusion, knee    Bruise with very slight tenderness Nl rom and no deformity of pain  Recommended ice  Update if not starting to improve in a week or if worsening        Left ear pain    Ear looks ok-but pt has been taking old doxycycline she had Feel she needs to finish course-this was px  Also disc poss  of radiation from TMJ pain  Update if not starting to improve in a week or if worsening         Other Visit Diagnoses    Need for influenza vaccination       Relevant Orders   Flu Vaccine QUAD High Dose(Fluad) (Completed)

## 2019-11-15 NOTE — Assessment & Plan Note (Signed)
Involving horse in stable  Hit R shoulder (already had rot cuff surg) and some pain with limited rom  L knee- contused   No concern for fracture at this time Discussed fall prevention in detail

## 2019-11-15 NOTE — Telephone Encounter (Signed)
Scheduled appt per 9/23 sch msg - pt is aware of appt

## 2019-11-15 NOTE — Assessment & Plan Note (Signed)
After fall upon it (prev rot cuff surg)  Limited rom  Using ice  Enc rom exercises  No deformity-reassuring exam  Plan to ice and do exercises then update in a week  Will ref to ortho if needed

## 2019-11-15 NOTE — Assessment & Plan Note (Signed)
Ear looks ok-but pt has been taking old doxycycline she had Feel she needs to finish course-this was px  Also disc poss of radiation from TMJ pain  Update if not starting to improve in a week or if worsening

## 2019-11-16 ENCOUNTER — Other Ambulatory Visit: Payer: Self-pay

## 2019-11-16 ENCOUNTER — Encounter (HOSPITAL_BASED_OUTPATIENT_CLINIC_OR_DEPARTMENT_OTHER): Payer: Self-pay | Admitting: General Surgery

## 2019-11-17 ENCOUNTER — Other Ambulatory Visit (HOSPITAL_COMMUNITY)
Admission: RE | Admit: 2019-11-17 | Discharge: 2019-11-17 | Disposition: A | Discharge status: Home / Self Care | Payer: Medicare Other | Source: Ambulatory Visit | Attending: General Surgery | Admitting: General Surgery

## 2019-11-17 DIAGNOSIS — Z01812 Encounter for preprocedural laboratory examination: Secondary | ICD-10-CM | POA: Insufficient documentation

## 2019-11-17 DIAGNOSIS — Z20822 Contact with and (suspected) exposure to covid-19: Secondary | ICD-10-CM | POA: Diagnosis not present

## 2019-11-17 LAB — SARS CORONAVIRUS 2 (TAT 6-24 HRS): SARS Coronavirus 2: NEGATIVE

## 2019-11-19 ENCOUNTER — Other Ambulatory Visit: Payer: Self-pay | Admitting: General Surgery

## 2019-11-20 NOTE — Progress Notes (Signed)
Dr Marlowe Aschoff office was notified that no orders to hold eliquis noted. Spoke to April at [facility] office just now who informed me that she (April), contacted the patient on September 24th and told the patient to hold eliquis for two days prior to surgery that is scheduled on 09/29/2021as per Dr. Marlowe Aschoff instructions.

## 2019-11-21 ENCOUNTER — Other Ambulatory Visit: Payer: Self-pay

## 2019-11-21 ENCOUNTER — Encounter (HOSPITAL_BASED_OUTPATIENT_CLINIC_OR_DEPARTMENT_OTHER)
Admission: RE | Disposition: A | Discharge status: Home / Self Care | Payer: Self-pay | Source: Ambulatory Visit | Attending: General Surgery

## 2019-11-21 ENCOUNTER — Ambulatory Visit (HOSPITAL_BASED_OUTPATIENT_CLINIC_OR_DEPARTMENT_OTHER): Payer: Medicare Other | Admitting: Certified Registered"

## 2019-11-21 ENCOUNTER — Encounter (HOSPITAL_BASED_OUTPATIENT_CLINIC_OR_DEPARTMENT_OTHER): Payer: Self-pay | Admitting: General Surgery

## 2019-11-21 ENCOUNTER — Ambulatory Visit (HOSPITAL_COMMUNITY)
Admission: RE | Admit: 2019-11-21 | Discharge: 2019-11-21 | Disposition: A | Discharge status: Home / Self Care | Payer: Medicare Other | Source: Ambulatory Visit | Attending: General Surgery | Admitting: General Surgery

## 2019-11-21 DIAGNOSIS — E785 Hyperlipidemia, unspecified: Secondary | ICD-10-CM | POA: Diagnosis not present

## 2019-11-21 DIAGNOSIS — I2510 Atherosclerotic heart disease of native coronary artery without angina pectoris: Secondary | ICD-10-CM | POA: Insufficient documentation

## 2019-11-21 DIAGNOSIS — H409 Unspecified glaucoma: Secondary | ICD-10-CM | POA: Diagnosis not present

## 2019-11-21 DIAGNOSIS — Z885 Allergy status to narcotic agent status: Secondary | ICD-10-CM | POA: Insufficient documentation

## 2019-11-21 DIAGNOSIS — Z882 Allergy status to sulfonamides status: Secondary | ICD-10-CM | POA: Insufficient documentation

## 2019-11-21 DIAGNOSIS — Z8673 Personal history of transient ischemic attack (TIA), and cerebral infarction without residual deficits: Secondary | ICD-10-CM | POA: Diagnosis not present

## 2019-11-21 DIAGNOSIS — Z452 Encounter for adjustment and management of vascular access device: Secondary | ICD-10-CM | POA: Insufficient documentation

## 2019-11-21 DIAGNOSIS — M1990 Unspecified osteoarthritis, unspecified site: Secondary | ICD-10-CM | POA: Diagnosis not present

## 2019-11-21 DIAGNOSIS — Z8249 Family history of ischemic heart disease and other diseases of the circulatory system: Secondary | ICD-10-CM | POA: Insufficient documentation

## 2019-11-21 DIAGNOSIS — I10 Essential (primary) hypertension: Secondary | ICD-10-CM | POA: Insufficient documentation

## 2019-11-21 DIAGNOSIS — Z79899 Other long term (current) drug therapy: Secondary | ICD-10-CM | POA: Insufficient documentation

## 2019-11-21 DIAGNOSIS — Z85038 Personal history of other malignant neoplasm of large intestine: Secondary | ICD-10-CM | POA: Diagnosis not present

## 2019-11-21 DIAGNOSIS — Z7901 Long term (current) use of anticoagulants: Secondary | ICD-10-CM | POA: Insufficient documentation

## 2019-11-21 DIAGNOSIS — Z880 Allergy status to penicillin: Secondary | ICD-10-CM | POA: Insufficient documentation

## 2019-11-21 DIAGNOSIS — C188 Malignant neoplasm of overlapping sites of colon: Secondary | ICD-10-CM | POA: Diagnosis not present

## 2019-11-21 DIAGNOSIS — Z881 Allergy status to other antibiotic agents status: Secondary | ICD-10-CM | POA: Diagnosis not present

## 2019-11-21 HISTORY — PX: PORT-A-CATH REMOVAL: SHX5289

## 2019-11-21 SURGERY — REMOVAL PORT-A-CATH
Anesthesia: Monitor Anesthesia Care | Site: Chest | Laterality: Left

## 2019-11-21 MED ORDER — CIPROFLOXACIN IN D5W 400 MG/200ML IV SOLN
400.0000 mg | INTRAVENOUS | Status: AC
  Administered 2019-11-21: 400 mg via INTRAVENOUS

## 2019-11-21 MED ORDER — LIDOCAINE 2% (20 MG/ML) 5 ML SYRINGE
INTRAMUSCULAR | Status: DC | PRN
  Administered 2019-11-21: 40 mg via INTRAVENOUS

## 2019-11-21 MED ORDER — ONDANSETRON HCL 4 MG PO TABS: 4.0000 mg | ORAL_TABLET | Freq: Four times a day (QID) | ORAL | 0 refills | Status: DC | PRN

## 2019-11-21 MED ORDER — LACTATED RINGERS IV SOLN: INTRAVENOUS | Status: DC

## 2019-11-21 MED ORDER — CHLORHEXIDINE GLUCONATE CLOTH 2 % EX PADS: 6.0000 | MEDICATED_PAD | Freq: Once | CUTANEOUS | Status: DC

## 2019-11-21 MED ORDER — FENTANYL CITRATE (PF) 100 MCG/2ML IJ SOLN
INTRAMUSCULAR | Status: AC
Start: 2019-11-21 — End: ?
  Filled 2019-11-21: qty 2

## 2019-11-21 MED ORDER — PROPOFOL 10 MG/ML IV BOLUS
INTRAVENOUS | Status: AC
  Filled 2019-11-21: qty 20

## 2019-11-21 MED ORDER — PROPOFOL 500 MG/50ML IV EMUL
INTRAVENOUS | Status: DC | PRN
  Administered 2019-11-21: 135 ug/kg/min via INTRAVENOUS

## 2019-11-21 MED ORDER — CIPROFLOXACIN IN D5W 400 MG/200ML IV SOLN
INTRAVENOUS | Status: AC
  Filled 2019-11-21: qty 200

## 2019-11-21 MED ORDER — LIDOCAINE-EPINEPHRINE (PF) 1 %-1:200000 IJ SOLN
INTRAMUSCULAR | Status: DC | PRN
  Administered 2019-11-21: 11 mL

## 2019-11-21 MED ORDER — ONDANSETRON HCL 4 MG/2ML IJ SOLN
INTRAMUSCULAR | Status: AC
  Filled 2019-11-21: qty 2

## 2019-11-21 MED ORDER — ONDANSETRON HCL 4 MG/2ML IJ SOLN
INTRAMUSCULAR | Status: DC | PRN
  Administered 2019-11-21: 4 mg via INTRAVENOUS

## 2019-11-21 MED ORDER — FENTANYL CITRATE (PF) 100 MCG/2ML IJ SOLN
INTRAMUSCULAR | Status: DC | PRN
Start: 2019-11-21 — End: 2019-11-21
  Administered 2019-11-21 (×2): 25 ug via INTRAVENOUS

## 2019-11-21 MED ORDER — ACETAMINOPHEN 500 MG PO TABS
1000.0000 mg | ORAL_TABLET | ORAL | Status: AC
  Administered 2019-11-21: 1000 mg via ORAL

## 2019-11-21 MED ORDER — ACETAMINOPHEN 500 MG PO TABS
ORAL_TABLET | ORAL | Status: AC
  Filled 2019-11-21: qty 2

## 2019-11-21 MED ORDER — TRAMADOL HCL 50 MG PO TABS: 50.0000 mg | ORAL_TABLET | Freq: Four times a day (QID) | ORAL | 0 refills | Status: DC | PRN

## 2019-11-21 MED ORDER — PROPOFOL 10 MG/ML IV BOLUS
INTRAVENOUS | Status: DC | PRN
  Administered 2019-11-21: 20 mg via INTRAVENOUS
  Administered 2019-11-21: 10 mg via INTRAVENOUS

## 2019-11-21 SURGICAL SUPPLY — 38 items
ADH SKN CLS APL DERMABOND .7 (GAUZE/BANDAGES/DRESSINGS) ×1
APL PRP STRL LF DISP 70% ISPRP (MISCELLANEOUS) ×1
BLADE HEX COATED 2.75 (ELECTRODE) ×1 IMPLANT
BLADE SURG 15 STRL LF DISP TIS (BLADE) ×1 IMPLANT
BLADE SURG 15 STRL SS (BLADE) ×2
CANISTER SUCT 1200ML W/VALVE (MISCELLANEOUS) IMPLANT
CHLORAPREP W/TINT 26 (MISCELLANEOUS) ×2 IMPLANT
COVER BACK TABLE 60X90IN (DRAPES) ×2 IMPLANT
COVER MAYO STAND STRL (DRAPES) ×2 IMPLANT
COVER WAND RF STERILE (DRAPES) IMPLANT
DECANTER SPIKE VIAL GLASS SM (MISCELLANEOUS) IMPLANT
DERMABOND ADVANCED (GAUZE/BANDAGES/DRESSINGS) ×1
DERMABOND ADVANCED .7 DNX12 (GAUZE/BANDAGES/DRESSINGS) ×1 IMPLANT
DRAPE LAPAROTOMY 100X72 PEDS (DRAPES) ×2 IMPLANT
DRAPE UTILITY XL STRL (DRAPES) ×2 IMPLANT
ELECT REM PT RETURN 9FT ADLT (ELECTROSURGICAL) ×2
ELECTRODE REM PT RTRN 9FT ADLT (ELECTROSURGICAL) ×1 IMPLANT
GLOVE BIO SURGEON STRL SZ 6 (GLOVE) ×2 IMPLANT
GLOVE BIOGEL PI IND STRL 6.5 (GLOVE) ×1 IMPLANT
GLOVE BIOGEL PI IND STRL 7.0 (GLOVE) IMPLANT
GLOVE BIOGEL PI INDICATOR 6.5 (GLOVE) ×1
GLOVE BIOGEL PI INDICATOR 7.0 (GLOVE) ×2
GLOVE ECLIPSE 6.5 STRL STRAW (GLOVE) ×1 IMPLANT
GOWN STRL REUS W/ TWL LRG LVL3 (GOWN DISPOSABLE) ×1 IMPLANT
GOWN STRL REUS W/TWL 2XL LVL3 (GOWN DISPOSABLE) ×2 IMPLANT
GOWN STRL REUS W/TWL LRG LVL3 (GOWN DISPOSABLE) ×2
NDL HYPO 25X1 1.5 SAFETY (NEEDLE) ×1 IMPLANT
NEEDLE HYPO 25X1 1.5 SAFETY (NEEDLE) ×2 IMPLANT
NS IRRIG 1000ML POUR BTL (IV SOLUTION) IMPLANT
PACK BASIN DAY SURGERY FS (CUSTOM PROCEDURE TRAY) ×2 IMPLANT
PENCIL SMOKE EVACUATOR (MISCELLANEOUS) ×2 IMPLANT
SUT MNCRL AB 4-0 PS2 18 (SUTURE) ×2 IMPLANT
SUT VIC AB 3-0 SH 27 (SUTURE) ×2
SUT VIC AB 3-0 SH 27X BRD (SUTURE) ×1 IMPLANT
SYR CONTROL 10ML LL (SYRINGE) ×2 IMPLANT
TOWEL GREEN STERILE FF (TOWEL DISPOSABLE) ×2 IMPLANT
TUBE CONNECTING 20X1/4 (TUBING) IMPLANT
YANKAUER SUCT BULB TIP NO VENT (SUCTIONS) IMPLANT

## 2019-11-21 NOTE — Anesthesia Postprocedure Evaluation (Signed)
Anesthesia Post Note  Patient: [name]  Procedure(s) Performed: REMOVAL PORT-A-CATH (Left Chest)     Patient location during evaluation: PACU Anesthesia Type: MAC Level of consciousness: awake and alert Pain management: pain level controlled Vital Signs Assessment: post-procedure vital signs reviewed and stable Respiratory status: spontaneous breathing, nonlabored ventilation, respiratory function stable and patient connected to nasal cannula oxygen Cardiovascular status: stable and blood pressure returned to baseline Postop Assessment: no apparent nausea or vomiting Anesthetic complications: no   No complications documented.  Last Vitals:  Vitals:   11/21/19 1700 11/21/19 1720  BP: (!) 154/64 (!) 177/70  Pulse: (!) 56 (!) 55  Resp: 12 15  Temp:  36.4 C  SpO2: 98% 98%    Last Pain:  Vitals:   11/21/19 1720  TempSrc:   PainSc: 0-No pain                 Casondra Gasca

## 2019-11-21 NOTE — Anesthesia Procedure Notes (Signed)
Procedure Name: MAC Date/Time: 11/21/2019 3:29 PM Performed by: Niel Hummer, CRNA Pre-anesthesia Checklist: Patient identified, Emergency Drugs available, Suction available and Patient being monitored Oxygen Delivery Method: Simple face mask

## 2019-11-21 NOTE — Transfer of Care (Signed)
Immediate Anesthesia Transfer of Care Note  Patient: [name]  Procedure(s) Performed: REMOVAL PORT-A-CATH (Left Chest)  Patient Location: PACU  Anesthesia Type:MAC  Level of Consciousness: awake, alert  and oriented  Airway & Oxygen Therapy: Patient Spontanous Breathing and Patient connected to face mask oxygen  Post-op Assessment: Report given to RN, Post -op Vital signs reviewed and stable and Patient moving all extremities X 4  Post vital signs: Reviewed and stable  Last Vitals:  Vitals Value Taken Time  BP    Temp    Pulse    Resp 18 11/21/19 1615  SpO2    Vitals shown include unvalidated device data.  Last Pain:  Vitals:   11/21/19 1216  TempSrc: Oral  PainSc: 5       Patients Stated Pain Goal: 5 (56/81/27 5170)  Complications: No complications documented.

## 2019-11-21 NOTE — Op Note (Signed)
  PRE-OPERATIVE DIAGNOSIS:  un-needed Port-A-Cath for colon cancer  POST-OPERATIVE DIAGNOSIS:  Same   PROCEDURE:  Procedure(s):  REMOVAL PORT-A-CATH  SURGEON:  Surgeon(s):  [name], MD  ANESTHESIA:   MAC + local  EBL:   Minimal  SPECIMEN:  None  Complications : none known  Procedure:   Pt was  identified in the holding area and taken to the operating room where she was placed supine on the operating room table.  MAC anesthesia was induced.  The left upper chest was prepped and draped.  A time out was performed according to the surgical safety checklist.  When all was correct, we continued.    The prior incision was anesthetized with local anesthetic.  The incision was opened with a #15 blade.  The subcutaneous tissue was divided with the cautery.  The port was identified and the capsule opened.  The four 2-0 prolene sutures were removed.  The port was pulled, but felt stuck.  I used a hemostat to dissect the catheter all the way up to the inferior aspect of the clavicle.  I got the circulator to retract downward on the arm, opening up the space a bit.  The catheter still required a bit more tugging than usual, but did come out smoothly and without breakage.  Pressure held on the tract.  The catheter appeared intact without evidence of breakage, length was 21 cm.  The wound was inspected for hemostasis, which was achieved with cautery.    The wound was closed with 3-0 vicryl deep dermal interrupted sutures and 4-0 Monocryl running subcuticular suture.  The wound was cleaned, dried, and dressed with dermabond.  The patient was awakened from anesthesia and taken to the PACU in stable condition.  Needle, sponge, and instrument counts are correct.

## 2019-11-21 NOTE — H&P (Signed)
[name] is an 79 y.o. female.   Chief Complaint: colon cancer HPI:  Pt is a 79 yo F who is 3 years out from colon cancer dx.  She had lap extended right hemicolectomy.  She continues to have some indeterminate liver lesions, but these are unchanged.  She desires port removal.    Past Medical History:  Diagnosis Date  . Anemia   . Arthritis   . Astigmatism    both eyes  . Cancer [facility])    colon surgery done last chemo tx 01-17-17  . Cataract   . Colon cancer ([place])   . Coronary artery disease, non-occlusive   . Dyspnea    more so exertion  . Dysrhythmia    skipped beats every now and then  . GERD (gastroesophageal reflux disease)   . Glaucoma    both eyes right eye worse than left  . Hammer toes of both feet   . Headache    migraines with aura  . Heel spur    both heels and front  . HSV (herpes simplex virus) infection   . Hyperglycemia    denies  . Hyperlipidemia   . IBS (irritable bowel syndrome)   . IC (interstitial cystitis)   . Insomnia disorder related to known organic factor   . Osteoporosis   . PONV (postoperative nausea and vomiting)    has had more surgeries, and had no problems  . Stroke ([place]) 2016  . Vitamin D deficiency     Past Surgical History:  Procedure Laterality Date  . ABDOMINAL HYSTERECTOMY     1 ovary and womb removed  . CARDIAC CATHETERIZATION  7/12   non obst dz -- not sure what yr  . CHOLECYSTECTOMY    . EUS N/A 02/03/2017   Procedure: UPPER ENDOSCOPIC ULTRASOUND (EUS) LINEAR;  Surgeon: [name], MD;  [facility];  Service: Endoscopy;  Laterality: N/A;  . EYE SURGERY Bilateral    ioc for cataracts   . LAPAROSCOPIC RIGHT COLECTOMY Right 11/10/2016   Procedure: LAPAROSCOPIC EXTENDED RIGHT COLECTOMY;  Surgeon: [name], MD;  Location: [place];  Service: General;  Laterality: Right;  . PORTACATH PLACEMENT Left 12/22/2016   Procedure: INSERTION PORT-A-CATH;  Surgeon: [name], MD;  Location: [place];  Service: General;  Laterality: Left;  . ROTATOR CUFF REPAIR Bilateral     Family History  Problem Relation Age of Onset  . Alcohol abuse Mother   . Heart attack Mother   . GER disease Mother   . GER disease Father   . Alcohol abuse Father   . Stroke Father   . Tuberculosis Father   . Heart attack Father   . Aneurysm Father   . Mental illness Sister   . Depression Sister   . Breast cancer Neg Hx    Social History:  reports that she has never smoked. She has never used smokeless tobacco. She reports that she does not drink alcohol and does not use drugs.  Allergies:  Allergies  Allergen Reactions  . Salmon [Fish Allergy] Anaphylaxis    This happens with "CANNED SALMON" only, if eaten uncooked  . Ampicillin Nausea Only    Has patient had a PCN reaction causing immediate rash, facial/tongue/throat swelling, SOB or lightheadedness with hypotension: No Has patient had a PCN reaction causing severe rash involving mucus membranes or skin necrosis: No Has patient had a PCN reaction that required hospitalization: No Has patient had a PCN reaction occurring within  the last 10 years: No If all of the above answers are "NO", then may proceed with Cephalosporin use.   . Codeine Nausea And Vomiting  . Flagyl [Metronidazole] Nausea And Vomiting    Patient was taking this along with Neomycin and could not differentiate which med triggered the nausea & vomiting  . Meperidine Hcl Nausea And Vomiting  . Neomycin Nausea And Vomiting    Patient was taking this along with Flagyl and could not differentiate which med triggered the nausea & vomiting  . Propoxyphene Hcl Nausea And Vomiting  . Sulfa Antibiotics Hives  . Tramadol Hcl Nausea And Vomiting    Medications Prior to Admission  Medication Sig Dispense Refill  . Ascorbic Acid (VITAMIN C PO) Take by mouth.    Marland Kitchen atorvastatin (LIPITOR) 40 MG tablet Take 1 tablet (40 mg total) by mouth daily at 6 PM. 90 tablet 3  . b complex  vitamins tablet Take 1 tablet by mouth daily.    . Cholecalciferol (VITAMIN D) 2000 units tablet Take 2,000 Units by mouth 3 (three) times a week.     . doxycycline (VIBRA-TABS) 100 MG tablet Take 1 tablet (100 mg total) by mouth 2 (two) times daily. 10 tablet 0  . ELIQUIS 5 MG TABS tablet TAKE 1 TABLET BY MOUTH TWICE A DAY 60 tablet 2  . Multiple Vitamin (MULTIVITAMIN WITH MINERALS) TABS tablet Take 1 tablet by mouth daily.     . valACYclovir (VALTREX) 500 MG tablet TAKE 1 TABLET (500 MG TOTAL) BY MOUTH 2 (TWO) TIMES DAILY. 6 tablet 0  . potassium chloride SA (K-DUR,KLOR-CON) 20 MEQ tablet Take 1 tablet (20 mEq total) by mouth daily. 90 tablet 3    No results found for this or any previous visit (from the past 48 hour(s)). No results found.  Review of Systems  All other systems reviewed and are negative.   Blood pressure 125/75, pulse 70, temperature 97.8 F (36.6 C), temperature source Oral, resp. rate 20, height 5\' 4"  (1.626 m), weight 70.2 kg, SpO2 100 %. Physical Exam Constitutional:      Appearance: Normal appearance.  HENT:     Head: Normocephalic and atraumatic.     Mouth/Throat:     Mouth: Mucous membranes are moist.  Cardiovascular:     Rate and Rhythm: Normal rate.     Pulses: Normal pulses.  Pulmonary:     Effort: Pulmonary effort is normal.     Comments: Left subclavian port in place Abdominal:     General: Abdomen is flat.  Musculoskeletal:        General: Normal range of motion.  Skin:    General: Skin is warm and dry.  Neurological:     Mental Status: She is alert.  Psychiatric:        Mood and Affect: Mood normal.        Behavior: Behavior normal.        Thought Content: Thought content normal.        Judgment: Judgment normal.      Assessment/Plan Colon cancer, 3 years out. Port in place.  Discussed port removal and risks.  Pt desires to proceed.    [name], MD 11/21/2019, 3:17 PM

## 2019-11-21 NOTE — Anesthesia Preprocedure Evaluation (Addendum)
Anesthesia Evaluation  Patient identified by MRN, date of birth, ID band Patient awake    Reviewed: Patient's Chart, lab work & pertinent test results  History of Anesthesia Complications (+) PONV  Airway Mallampati: I  TM Distance: <3 FB Neck ROM: Full    Dental  (+) Teeth Intact   Pulmonary shortness of breath and with exertion,    Pulmonary exam normal        Cardiovascular hypertension, Pt. on medications + CAD   Rhythm:Regular Rate:Normal     Neuro/Psych  Headaches, CVA, No Residual Symptoms    GI/Hepatic Neg liver ROS, GERD  Medicated,History of colon cancer   Endo/Other  negative endocrine ROS  Renal/GU negative Renal ROS  negative genitourinary   Musculoskeletal  (+) Arthritis , Osteoarthritis,    Abdominal Normal abdominal exam  (+)  Abdomen: soft. Bowel sounds: normal.  Peds  Hematology  (+) anemia ,   Anesthesia Other Findings   Reproductive/Obstetrics                             Anesthesia Physical Anesthesia Plan  ASA: III  Anesthesia Plan: MAC   Post-op Pain Management:    Induction:   PONV Risk Score and Plan: 3 and Ondansetron, Dexamethasone, Propofol infusion and Treatment may vary due to age or medical condition  Airway Management Planned: Simple Face Mask and Nasal Cannula  Additional Equipment: None  Intra-op Plan:   Post-operative Plan:   Informed Consent: I have reviewed the patients History and Physical, chart, labs and discussed the procedure including the risks, benefits and alternatives for the proposed anesthesia with the patient or authorized representative who has indicated his/her understanding and acceptance.     Dental advisory given  Plan Discussed with: CRNA  Anesthesia Plan Comments: (Covid-19 Nucleic Acid Test Results Lab Results      Component                Value               Date                      [place]               NEGATIVE            11/17/2019           )        Anesthesia Quick Evaluation

## 2019-11-21 NOTE — Discharge Instructions (Signed)
Post Anesthesia Home Care Instructions  Activity: Get plenty of rest for the remainder of the day. A responsible individual must stay with you for 24 hours following the procedure.  For the next 24 hours, DO NOT: -Drive a car -Paediatric nurse -Drink alcoholic beverages -Take any medication unless instructed by your physician -Make any legal decisions or sign important papers.  Meals: Start with liquid foods such as gelatin or soup. Progress to regular foods as tolerated. Avoid greasy, spicy, heavy foods. If nausea and/or vomiting occur, drink only clear liquids until the nausea and/or vomiting subsides. Call your physician if vomiting continues.  Special Instructions/Symptoms: Your throat may feel dry or sore from the anesthesia or the breathing tube placed in your throat during surgery. If this causes discomfort, gargle with warm salt water. The discomfort should disappear within 24 hours.  If you had a scopolamine patch placed behind your ear for the management of post- operative nausea and/or vomiting:  1. The medication in the patch is effective for 72 hours, after which it should be removed.  Wrap patch in a tissue and discard in the trash. Wash hands thoroughly with soap and water. 2. You may remove the patch earlier than 72 hours if you experience unpleasant side effects which may include dry mouth, dizziness or visual disturbances. 3. Avoid touching the patch. Wash your hands with soap and water after contact with the patch.    May take Tylenol after 6:30 pm, if needed.    [facility] Phone Number [phone]   POST OP INSTRUCTIONS  Always review your discharge instruction sheet given to you by the facility where your surgery was performed.  IF YOU HAVE DISABILITY OR FAMILY LEAVE FORMS, YOU MUST BRING THEM TO THE OFFICE FOR PROCESSING.  DO NOT GIVE THEM TO YOUR DOCTOR.  1. A prescription for pain medication may be given to you upon discharge.   Take your pain medication as prescribed, if needed.  If narcotic pain medicine is not needed, then you may take acetaminophen (Tylenol) or ibuprofen (Advil) as needed. 2. Take your usually prescribed medications unless otherwise directed 3. If you need a refill on your pain medication, please contact your pharmacy.  They will contact our office to request authorization.  Prescriptions will not be filled after 5pm or on week-ends. 4. You should eat very light the first 24 hours after surgery, such as soup, crackers, pudding, etc.  Resume your normal diet the day after surgery 5. It is common to experience some constipation if taking pain medication after surgery.  Increasing fluid intake and taking a stool softener will usually help or prevent this problem from occurring.  A mild laxative (Milk of Magnesia or Miralax) should be taken according to package directions if there are no bowel movements after 48 hours. 6. You may shower in 48 hours.  The surgical glue will flake off in 2-3 weeks.   7. ACTIVITIES:  No strenuous activity or heavy lifting for 1 week.   a. You may drive when you no longer are taking prescription pain medication, you can comfortably wear a seatbelt, and you can safely maneuver your car and apply brakes. b. RETURN TO WORK:  __________n/a_______________ Dennis Bast should see your doctor in the office for a follow-up appointment approximately three-four weeks after your surgery.    WHEN TO CALL YOUR DOCTOR: 1. Fever over 101.0 2. Nausea and/or vomiting. 3. Extreme swelling or bruising. 4. Continued bleeding from incision. 5. Increased pain, redness, or drainage  from the incision.  The clinic staff is available to answer your questions during regular business hours.  Please don't hesitate to call and ask to speak to one of the nurses for clinical concerns.  If you have a medical emergency, go to the nearest emergency room or call 911.  A surgeon from [facility] is always on  call at the hospital.  For further questions, please visit [email or web address]

## 2019-11-22 ENCOUNTER — Encounter (HOSPITAL_BASED_OUTPATIENT_CLINIC_OR_DEPARTMENT_OTHER): Payer: Self-pay | Admitting: General Surgery

## 2019-12-12 ENCOUNTER — Encounter: Payer: Self-pay | Admitting: Nurse Practitioner

## 2019-12-12 ENCOUNTER — Other Ambulatory Visit: Payer: Self-pay

## 2019-12-12 ENCOUNTER — Inpatient Hospital Stay: Payer: Medicare Other

## 2019-12-12 ENCOUNTER — Inpatient Hospital Stay: Payer: Medicare Other | Attending: Nurse Practitioner | Admitting: Nurse Practitioner

## 2019-12-12 VITALS — BP 142/72 | HR 65 | Temp 98.2°F | Resp 18 | Ht 64.0 in | Wt 155.6 lb

## 2019-12-12 DIAGNOSIS — C188 Malignant neoplasm of overlapping sites of colon: Secondary | ICD-10-CM

## 2019-12-12 DIAGNOSIS — C189 Malignant neoplasm of colon, unspecified: Secondary | ICD-10-CM

## 2019-12-12 DIAGNOSIS — Z1231 Encounter for screening mammogram for malignant neoplasm of breast: Secondary | ICD-10-CM | POA: Diagnosis not present

## 2019-12-12 DIAGNOSIS — D500 Iron deficiency anemia secondary to blood loss (chronic): Secondary | ICD-10-CM

## 2019-12-12 LAB — CBC WITH DIFFERENTIAL/PLATELET
Abs Immature Granulocytes: 0.01 10*3/uL (ref 0.00–0.07)
Basophils Absolute: 0.1 10*3/uL (ref 0.0–0.1)
Basophils Relative: 1 %
Eosinophils Absolute: 0.1 10*3/uL (ref 0.0–0.5)
Eosinophils Relative: 1 %
HCT: 38.5 % (ref 36.0–46.0)
Hemoglobin: 12.3 g/dL (ref 12.0–15.0)
Immature Granulocytes: 0 %
Lymphocytes Relative: 25 %
Lymphs Abs: 1.7 10*3/uL (ref 0.7–4.0)
MCH: 30.1 pg (ref 26.0–34.0)
MCHC: 31.9 g/dL (ref 30.0–36.0)
MCV: 94.1 fL (ref 80.0–100.0)
Monocytes Absolute: 0.7 10*3/uL (ref 0.1–1.0)
Monocytes Relative: 10 %
Neutro Abs: 4.2 10*3/uL (ref 1.7–7.7)
Neutrophils Relative %: 63 %
Platelets: 249 10*3/uL (ref 150–400)
RBC: 4.09 MIL/uL (ref 3.87–5.11)
RDW: 12.9 % (ref 11.5–15.5)
WBC: 6.7 10*3/uL (ref 4.0–10.5)
nRBC: 0 % (ref 0.0–0.2)

## 2019-12-12 LAB — COMPREHENSIVE METABOLIC PANEL
ALT: 19 U/L (ref 0–44)
AST: 19 U/L (ref 15–41)
Albumin: 3.7 g/dL (ref 3.5–5.0)
Alkaline Phosphatase: 77 U/L (ref 38–126)
Anion gap: 7 (ref 5–15)
BUN: 14 mg/dL (ref 8–23)
CO2: 26 mmol/L (ref 22–32)
Calcium: 9.6 mg/dL (ref 8.9–10.3)
Chloride: 107 mmol/L (ref 98–111)
Creatinine, Ser: 0.96 mg/dL (ref 0.44–1.00)
GFR, Estimated: 55 mL/min — ABNORMAL LOW (ref >60)
Glucose, Bld: 96 mg/dL (ref 70–99)
Potassium: 4.1 mmol/L (ref 3.5–5.1)
Sodium: 140 mmol/L (ref 135–145)
Total Bilirubin: 0.6 mg/dL (ref 0.3–1.2)
Total Protein: 6.7 g/dL (ref 6.5–8.1)

## 2019-12-12 LAB — RETICULOCYTES
Immature Retic Fract: 8.5 % (ref 2.3–15.9)
RBC.: 4.09 MIL/uL (ref 3.87–5.11)
Retic Count, Absolute: 84.7 10*3/uL (ref 19.0–186.0)
Retic Ct Pct: 2.1 % (ref 0.4–3.1)

## 2019-12-12 LAB — IRON AND TIBC
Iron: 121 ug/dL (ref 41–142)
Saturation Ratios: 39 % (ref 21–57)
TIBC: 309 ug/dL (ref 236–444)
UIBC: 187 ug/dL (ref 120–384)

## 2019-12-12 LAB — CEA (IN HOUSE-CHCC): CEA (CHCC-In House): 1.68 ng/mL (ref 0.00–5.00)

## 2019-12-12 LAB — FERRITIN: Ferritin: 56 ng/mL (ref 11–307)

## 2019-12-12 NOTE — Progress Notes (Signed)
[name]   Telephone:([phone]) [phone] Fax:(336) [phone]   Clinic Follow up Note   Patient Care Team: Tower, [name], MD as PCP - General [name], MD as Consulting Physician (Hematology) [name], MD as Attending Physician (Gastroenterology) [name], MD as Consulting Physician (General Surgery) 12/12/2019   I connected with [name] on 12/12/19 at  1:15 PM EDT by video enabled telemedicine visit and verified that I am speaking with the correct person using two identifiers.   I discussed the limitations, risks, security and privacy concerns of performing an evaluation and management service by telemedicine and the availability of in-person appointments. I also discussed with the patient that there may be a patient responsible charge related to this service. The patient expressed understanding and agreed to proceed.   Other persons participating in the visit and their role in the encounter: None  Patient's location: [facility] exam room Provider's location: Home  CHIEF COMPLAINT: F/u h/o colon cancer   SUMMARY OF ONCOLOGIC HISTORY: Oncology History Overview Note  Cancer Staging Cancer of overlapping sites of colon [facility]) Staging form: Colon and Rectum, AJCC 8th Edition - Pathologic stage from 11/10/2016: Stage IIIB (pT3(m), pN1c, cM0) - Signed by [name], MD on 12/16/2016     Cancer of overlapping sites of colon [facility])  08/31/2016 Tumor Marker   Patient's tumor was tested for the following markers: CEA. Results of the tumor marker test revealed 2.0.   08/31/2016 Pathology Results   Colon biopsy Diagnosis: 1. Surgical [P], hepatic flexure. ADENOCARCINOMA. Moderately differentiated. 2. Surgical [P], transverse. ADENOCARCINOMA, Moderately differentiated.   08/31/2016 Initial Diagnosis   Colon cancer metastasized to liver ([place])   08/31/2016 Procedure   - 1) Polypoid mass at the hepatic flexure, 2cm, soft but clearly neoplastic.  - 2) Clearly  malignant mass in the distal transverse colon; 4cm, firm, 1/2 cirumference, ulcerated. ' - 3) Four small (3-42m) scattered typical appearing adenomas throughout the colon, notremoved since she mistakenly stayed on her plavix for this procedure.   09/01/2016 Imaging   CT Abdomen Pelvis W Contrast IMPRESSION: There are 2 colonic lesions involving the proximal ascending colon and proximal transverse colon, as detailed above, highly concerning for colonic neoplasm. In addition, there are at least 2 indeterminate liver lesions which are suspicious for potential metastatic disease.    09/16/2016 Imaging   Mr Liver W Wo Contrast IMPRESSION: Lesion within central right lobe of liver exhibits of peripheral enhancement and is suspicious for liver metastasis. Lesion within caudate lobe of liver identified on recent CT does not have a corresponding signal or enhancement abnormality on today's study. Within the inferior right lobe of liver there is an enhancing structure which is favored to represent an atypical benign hemangioma with liver metastasis felt less likely. The transverse colon lesion is again identified compatible with colonic adenocarcinoma.   10/04/2016 PET scan   PET 10/04/16 IMPRESSION: 1. Highly hypermetabolic proximal transverse colon mass, maximum SUV 30.8. Small focus of hypermetabolic activity in the mesentery just above this mass probably represents a local involved lymph node. 2. Moderately hypermetabolic ascending colon mass, maximum SUV 8.7. 3. Highly hypermetabolic enlarged portacaval lymph node, maximum SUV 16.7. 4. Focus of hypermetabolic activity near the duodenum bulb could be physiologic or due to an adjacent lymph node. 5. None of the liver lesions are discernibly hypermetabolic. Particularly the more cephalad right hepatic lobe lesion merits surveillance, it had nonspecific enhancement characteristics on prior cross-sectional imaging and only a thin enhancing wall which  could  conceivably predispose to false negative due to central necrosis. 6. Several tiny chronic pulmonary nodules are likely benign. 7. Other imaging findings of potential clinical significance: Aortic Atherosclerosis (ICD10-I70.0). Coronary atherosclerosis. Bilateral nonobstructive nephrolithiasis.   11/10/2016 Surgery   LAPAROSCOPIC EXTENDED RIGHT COLECTOMY by [name]    11/10/2016 Pathology Results   Diagnosis 11/10/16 Colon, segmental resection for tumor, Ascending and Transverse ADENOCARCINOMA OF THE ASCENDING COLON WITH EXTRA CELLULAR MUCIN, GRADE 3, SIZE 3.4 CM THE TUMOR INVADES MUSCULARIS PROPRIA POORLY DIFFERENTIATED ADENOCARCINOMA OF THE TRANSVERSE COLON, GRADE 4, SIZE 6.1 CM THE TUMOR INVADES THROUGH MUSCULARIS PROPRIA INTO PERICOLONIC SOFT TISSUE EXTRAMURAL SATELLITE TUMOR NODULES IS IDENTIFIED (X1) ALL MARGINS OF RESECTION ARE NEGATIVE FOR CARCINOMA METASTATIC CARCINOMA IN ONE OF SIXTY LYMPH NODES (1/60) TABULAR ADENOMA X1   12/28/2016 Imaging   MRI abdomen w/wo contrast IMPRESSION: Motion degraded images.  Two indeterminate right liver lesions measuring up to 1.6 cm, as described above. Continued attention on follow-up is suggested.  Two periportal lymph nodes measuring up to 1.9 cm, suspicious for nodal metastases.  Postsurgical changes related to right colon resection.    12/29/2016 - 04/11/2017 Chemotherapy   FOLFOX every 2 weeks starting 12/29/16 last cycle 7 on 04/11/17.    02/03/2017 Procedure   EUS Per [name] Endoscopic Finding Findings: The examined esophagus was endoscopically normal. The entire examined stomach was endoscopically normal. The examined duodenum was endoscopically normal. Endosonographic Finding (limited evaluation for tissue acquisition): 1. One enlarged lymph node (vs soft tissue mass) was visualized in the porta hepatis region. It measured 16 mm in maximal cross-sectional diameter. The node (vs soft tissue) was oval, isoechoic and  had poorly defined margins. Fine needle aspiration for cytology was performed. Color Doppler imaging was utilized prior to needle puncture to confirm a lack of significant vascular structures within the needle path. Threepasses were made with the 25 gauge needle using a transgastric approach. 2. CBD was normal, non-dilated 3. Limited views of the liver, spleen, pancreas were all normal.  Impression: One periportal soft tissue mass (lymphnode?) measuring 4m was noted and sampled with FNA.   02/03/2017 Pathology Results   Diagnosis FINE NEEDLE ASPIRATION, ENDOSCOPIC, PERI PORTAL NODE (SPECIMEN 1 OF 1 COLLECTED 02/03/17): NO MALIGNANT CELLS IDENTIFIED. SCANT LYMPHOID AND BENIGN GLANDULAR ELEMENTS.   03/09/2017 Imaging   IMPRESSION: Interval decrease in several tiny low-attenuation liver lesions the, consistent with improving hepatic metastases.  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  TomorNext Lynch+ cancer Next was ordered throught he laboratory [facility]  TumorNext Lynch with CancerNext: Paired Germline and Tumor Analyses for EnhancedDiagnosis of Lynch Syndrome plus Analyses of 29 Additional Genes Associated with Hereditary Cancer.  Germline Genes analyzed: MLH1, MSH2, MSH6, PMS2, APC, ATM, BARD1, BMPR1A, BRIP1, CDH1, CDKN2A, CHEK2, DICER1, MRE11A, MUTYH, NBN, PALB2, PTEN, RAD50, RAD51C, RAD51D, SMAD4, STK11, TP53, CDK4, NF1, BRCA1, BRCA2, POLD1, POLE, SMARCA4, HOXB13 (sequencing and deletion/duplication); EPCAM, GREM1 (deletion/duplication only).  Results: Germline- Negative, no variants detected.  Tumor/Somatic-  BRAF V600E detected, No variants in KRAS or NRAS detected.  MLH1 promoter hypermethylation present, Microsatellite High.   Somatic pathogenic  mutations in MSH2 c.687delA and pathogenic mutation in MSH6 c.3261dupC.  2 Somatic Variants of uncertain significance in MSH2 called p.A123T and p.D654N.   The date of  this test report is 04/07/2017.     04/26/2017 - 06/12/2017 Chemotherapy   Xeloda 2071m in the AM and 15072min the PM started on 04/26/17. Changed to 150029mID on 05/16/17   06/13/2017 Imaging    CT CAP W Contrast 06/13/17 IMPRESSION: 1. Continued regression of hepatic metastatic disease. No new or progressive findings. 2. Surgical changes from a right hemicolectomy. No findings for recurrent cancer. No mesenteric or retroperitoneal mass or Adenopathy.   09/19/2017 Imaging   CT AP W Contrast 09/19/17  IMPRESSION: 1. The inferior right lobe of liver lesion appears slightly increased in size when compared with the previous exam. 2. No new liver lesions identified. 3. Stable appearance of the colon status post right hemicolectomy. 4.  Aortic Atherosclerosis (ICD10-I70.0).    10/01/2017 Imaging   MRI Abdomen W WO Contrast IMPRESSION: 1. Two small, subtle liver lesions are again identified involving segment 7 and segment 6. When compared with the MRI from 12/28/2016 these are not significantly changed. Compared with the MRI from 09/15/2016 the peripherally enhancing lesion within segment 7 has decreased in size in the interval. There has been no significant change in the segment 6 lesion. 2. No new foci of suspected metastasis identified.   03/29/2018 Imaging   MRI Abdomen  IMPRESSION: 1. The 2 small subtle liver lesions involving segment 7 and segment 6 are again noted. When compared with 10/01/2017 these are stable to minimally decreased in size. No new liver lesion identified. 2.  Aortic Atherosclerosis (ICD10-I70.0).    03/30/2018 Imaging   CT chest  IMPRESSION: 1. Solitary new solid pulmonary nodule in the anterior right middle lobe measuring 4 mm. Recommend attention on follow-up chest CT in 3 months. 2. No  additional potential findings of metastatic disease in he chest. 3. Nonobstructing left nephrolithiasis. 4. One vessel coronary atherosclerosis.  Aortic Atherosclerosis (ICD10-I70.0).    05/03/2019 Imaging   CT CAP W Contrast  IMPRESSION: 1. Stable exam. No new or progressive findings to suggest recurrent or metastatic disease in the chest, abdomen, or pelvis. 2. Stable tiny bilateral pulmonary nodules, likely benign. 3. 18 mm inferior right liver lesion seen on prior studies is more subtle on today's exam and measures smaller. A second tiny lesion described on prior studies towards the dome of the liver is inapparent on today's exam 4. Stable small low-density lesions in the right kidney, likely cysts. Continued attention on follow-up recommended. 5. Aortic Atherosclerosis (ICD10-I70.0).     CURRENT THERAPY: Surveillance   INTERVAL HISTORY: Ms. GasPlourdeturns for f/u as scheduled. The encounter is being conducted virtually. She is doing well overall. She had a fall while feeding her horses, landed hard on concrete on her right face, chest, shoulder. She is having pain, mild right arm swelling, and limited range of motion. She was seen by a provider for this. She had PAC removed recently by Dr. ByeBarry Dienestill having some left upper chest and shoulder pain with deep breath which has been ongoing. Denies cough or exertional chest pain. She does have mild DOE at baseline. She remains mobile and active. Appetite is normal. Denies unintentional weight loss. She has bloating in the evenings but this predates her cancer diagnosis or treatment. Denies pain or change in bowel habits. Denies blood in stool. Continues eliquis, denies signs of thrombosis. Denies recent infection or other concerns.    MEDICAL HISTORY:  Past Medical History:  Diagnosis Date  . Anemia   . Arthritis   . Astigmatism    both eyes  .  Cancer [facility])    colon surgery done last chemo tx 01-17-17  . Cataract   .  Colon cancer ([place])   . Coronary artery disease, non-occlusive   . Dyspnea    more so exertion  . Dysrhythmia    skipped beats every now and then  . GERD (gastroesophageal reflux disease)   . Glaucoma    both eyes right eye worse than left  . Hammer toes of both feet   . Headache    migraines with aura  . Heel spur    both heels and front  . HSV (herpes simplex virus) infection   . Hyperglycemia    denies  . Hyperlipidemia   . IBS (irritable bowel syndrome)   . IC (interstitial cystitis)   . Insomnia disorder related to known organic factor   . Osteoporosis   . PONV (postoperative nausea and vomiting)    has had more surgeries, and had no problems  . Stroke ([place]) 2016  . Vitamin D deficiency     SURGICAL HISTORY: Past Surgical History:  Procedure Laterality Date  . ABDOMINAL HYSTERECTOMY     1 ovary and womb removed  . CARDIAC CATHETERIZATION  7/12   non obst dz -- not sure what yr  . CHOLECYSTECTOMY    . EUS N/A 02/03/2017   Procedure: UPPER ENDOSCOPIC ULTRASOUND (EUS) LINEAR;  Surgeon: [name], MD;  Location: [facility];  Service: Endoscopy;  Laterality: N/A;  . EYE SURGERY Bilateral    ioc for cataracts   . LAPAROSCOPIC RIGHT COLECTOMY Right 11/10/2016   Procedure: LAPAROSCOPIC EXTENDED RIGHT COLECTOMY;  Surgeon: [name], MD;  Location: [place];  Service: General;  Laterality: Right;  . PORT-A-CATH REMOVAL Left 11/21/2019   Procedure: REMOVAL PORT-A-CATH;  Surgeon: [name], MD;  Location: [place];  Service: General;  Laterality: Left;  . PORTACATH PLACEMENT Left 12/22/2016   Procedure: INSERTION PORT-A-CATH;  Surgeon: [name], MD;  Location: [place];  Service: General;  Laterality: Left;  . ROTATOR CUFF REPAIR Bilateral     I have reviewed the social history and family history with the patient and they are unchanged from previous note.  ALLERGIES:  is allergic to salmon [fish allergy], ampicillin,  codeine, flagyl [metronidazole], meperidine hcl, neomycin, propoxyphene hcl, sulfa antibiotics, and tramadol hcl.  MEDICATIONS:  Current Outpatient Medications  Medication Sig Dispense Refill  . Ascorbic Acid (VITAMIN C PO) Take by mouth.    Marland Kitchen atorvastatin (LIPITOR) 40 MG tablet Take 1 tablet (40 mg total) by mouth daily at 6 PM. (Patient taking differently: Take 40 mg by mouth daily. ) 90 tablet 3  . b complex vitamins tablet Take 1 tablet by mouth daily.    . Cholecalciferol (VITAMIN D) 2000 units tablet Take 2,000 Units by mouth 3 (three) times a week. Takes 5000 units    . ELIQUIS 5 MG TABS tablet TAKE 1 TABLET BY MOUTH TWICE A DAY 60 tablet 2  . Multiple Vitamin (MULTIVITAMIN WITH MINERALS) TABS tablet Take 1 tablet by mouth daily.     . ondansetron (ZOFRAN) 4 MG tablet Take 1 tablet (4 mg total) by mouth every 6 (six) hours as needed for nausea or vomiting. (Patient not taking: Reported on 12/12/2019) 10 tablet 0  . potassium chloride SA (K-DUR,KLOR-CON) 20 MEQ tablet Take 1 tablet (20 mEq total) by mouth daily. (Patient not taking: Reported on 12/12/2019) 90 tablet 3  . traMADol (ULTRAM) 50 MG tablet Take 1 tablet (50 mg  total) by mouth every 6 (six) hours as needed for moderate pain or severe pain. (Patient not taking: Reported on 12/12/2019) 5 tablet 0   No current facility-administered medications for this visit.    PHYSICAL EXAMINATION: ECOG PERFORMANCE STATUS: 0 - Asymptomatic  Vitals:   12/12/19 1303  BP: (!) 142/72  Pulse: 65  Resp: 18  Temp: 98.2 F (36.8 C)  SpO2: 99%   Filed Weights   12/12/19 1303  Weight: 155 lb 9.6 oz (70.6 kg)    Exam limited to observation due to virtual nature of today's encounter: GENERAL:alert, no distress and comfortable SKIN: appears normal color, no obvious rash  LUNGS: normal breathing effort Musculoskeletal: limited RUE ROM  NEURO: alert & oriented x 3 with fluent speech,    LABORATORY DATA:  I have reviewed the data as  listed CBC Latest Ref Rng & Units 12/12/2019 05/03/2019 02/20/2019  WBC 4.0 - 10.5 K/uL 6.7 6.4 7.3  Hemoglobin 12.0 - 15.0 g/dL 12.3 13.1 12.9  Hematocrit 36 - 46 % 38.5 41.0 39.1  Platelets 150 - 400 K/uL 249 268 257.0     CMP Latest Ref Rng & Units 12/12/2019 05/03/2019 02/20/2019  Glucose 70 - 99 mg/dL 96 87 86  BUN 8 - 23 mg/dL _0 Creatinine 0.44 - 1.00 mg/dL 0.96 0.93 0.87  Sodium 135 - 145 mmol/L 140 138 140  Potassium 3.5 - 5.1 mmol/L 4.1 3.5 4.0  Chloride 98 - 111 mmol/L 107 104 107  CO2 22 - 32 mmol/L _1 Calcium 8.9 - 10.3 mg/dL 9.6 8.8(L) 9.4  Total Protein 6.5 - 8.1 g/dL 6.7 6.9 6.7  Total Bilirubin 0.3 - 1.2 mg/dL 0.6 0.9 0.5  Alkaline Phos 38 - 126 U/L 77 90 68  AST 15 - 41 U/L _2 ALT 0 - 44 U/L _3 RADIOGRAPHIC STUDIES: I have personally reviewed the radiological images as listed and agreed with the findings in the report. No results found.   ASSESSMENT & PLAN: Ivyonna Hoelzel is a 79 y.o. female with   1. Synchronized colon adenocarcinoma, moderately differentiated, in hepaticflexureand distal transverse colon, pT3(m)N1cMx with indeterminate liver lesions, at least stage IIIB, MSI-H -She was diagnosed in 08/31/16. She is s/p right colectomy, adjuvant FOLFOX changed to Xeloda after cycle 6 due to poor tolerance  -Initial staging scan showed 2 liver lesions, suspicious for liver metastasis, but not hypermetabolic on PET. The liver lesion has decreased in size after adjuvant chemotherapy. -She is currently on observation, surveillance imaging shows stable liver lesions, otherwise NED. Next scan in 05/2020 then likely will stop if no change or concern  -She is over 3 years from diagnosis, continue surveillance   2. Genetics was negative  3. Liver lesions  -Previousbiopsy of theenlarged portal node was negative for metastasis, but also appearto shrink onchemotherapybased on repeat scans. -IR felt liver lesions were too  difficult to biopsy  -last CT 05/03/19 shows liver lesions smaller, and smaller lesion not seen  -repeat 05/2020    4. 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 HTN -f/u with PCP  6. Fall, right shoulder injury  -She fell while feeding [place], injured right shoulder -h/o right and left rotator cuff tears -she was seen by a provider after the fall, still has pain and limited ROM. I encouraged her to call  ortho and provided her with [facility] contact info   Disposition:  Ms. Aoun is clinically doing well. CBC and CMP are normal, iron studies and CEA are pending. There is no clinical suspicion for recurrence.   We discussed she is 3 years from her diagnosis, the recurrence risk is lower now. She will be due for screening colonoscopy in 09/2020 by [name]. I recommend to repeat surveillance CT in 6 months to monitor liver lesions. We will see her after scan, then f/u annually until 5 years.   She is also due for screening mammogram, I placed the order.   We discussed healthy lifestyle, remaining active, and maintaining a well rounded diet. She plans to get her covid19 booster vaccine soon. We reviewed s/sx of cancer recurrence.   Lab and CT in 6 months, f/u few days after, or sooner if she experiences unintentional weight loss, abdominal pain, change in bowel habits, bleeding, or other concerning changes. She agrees with the plan.   Orders Placed This Encounter  Procedures  . CT Abdomen Pelvis W Contrast    Standing Status:   Future    Standing Expiration Date:   12/11/2020    Order Specific Question:   If indicated for the ordered procedure, I authorize the administration of contrast media per Radiology protocol    Answer:   Yes    Order Specific Question:   Preferred imaging location?    Answer:   [facility]    Order Specific Question:   Is Oral Contrast  requested for this exam?    Answer:   Yes, Per Radiology protocol  . MM 3D SCREEN BREAST BILATERAL    Standing Status:   Future    Standing Expiration Date:   12/11/2020    Order Specific Question:   Reason for Exam (SYMPTOM  OR DIAGNOSIS REQUIRED)    Answer:   routine screening    Order Specific Question:   Preferred imaging location?    Answer:   [facility]   All questions were answered. The patient knows to call the clinic with any problems, questions or concerns. No barriers to learning were detected.      Alla Feeling, NP 12/12/19

## 2019-12-17 ENCOUNTER — Other Ambulatory Visit (HOSPITAL_COMMUNITY): Payer: Medicare Other

## 2020-02-12 ENCOUNTER — Other Ambulatory Visit: Payer: Self-pay | Admitting: Hematology

## 2020-03-03 ENCOUNTER — Other Ambulatory Visit: Payer: Self-pay | Admitting: *Deleted

## 2020-03-03 MED ORDER — ATORVASTATIN CALCIUM 40 MG PO TABS
40.0000 mg | ORAL_TABLET | Freq: Every day | ORAL | 0 refills | Status: DC
Start: 2020-03-03 — End: 2020-04-02

## 2020-03-03 NOTE — Telephone Encounter (Signed)
Please schedule PE and refill until then  Thanks  

## 2020-03-03 NOTE — Telephone Encounter (Signed)
Med refilled once and Carrie will reach out to pt to try and get appt scheduled  

## 2020-03-03 NOTE — Telephone Encounter (Signed)
Pt has had a few acute appts but last f/u was on 02/20/19 and that was the last time lipid labs were done. No future appts., please advise

## 2020-03-05 ENCOUNTER — Other Ambulatory Visit: Payer: Self-pay

## 2020-03-05 ENCOUNTER — Encounter: Payer: Self-pay | Admitting: Family Medicine

## 2020-03-05 ENCOUNTER — Ambulatory Visit (INDEPENDENT_AMBULATORY_CARE_PROVIDER_SITE_OTHER): Payer: Medicare HMO | Admitting: Family Medicine

## 2020-03-05 VITALS — BP 100/64 | HR 82 | Temp 97.6°F | Ht 64.0 in | Wt 160.8 lb

## 2020-03-05 DIAGNOSIS — R350 Frequency of micturition: Secondary | ICD-10-CM

## 2020-03-05 DIAGNOSIS — M549 Dorsalgia, unspecified: Secondary | ICD-10-CM | POA: Diagnosis not present

## 2020-03-05 LAB — POC URINALSYSI DIPSTICK (AUTOMATED)
Bilirubin, UA: NEGATIVE
Glucose, UA: NEGATIVE
Ketones, UA: NEGATIVE
Leukocytes, UA: NEGATIVE
Nitrite, UA: NEGATIVE
Protein, UA: NEGATIVE
Spec Grav, UA: >=1.03 — AB (ref 1.010–1.025)
Urobilinogen, UA: 0.2 E.U./dL
pH, UA: 5 (ref 5.0–8.0)

## 2020-03-05 NOTE — Progress Notes (Signed)
[name] T. Regis [name], MD, [place] [facility] [place], [address]  Phone: [phone]  FAX: [place] - 80 y.o. female  MRN [id number]  Date of Birth: 12/26/1940  Date: 03/05/2020  PCP: Abner Greenspan, MD  Referral: Abner Greenspan, MD  Chief Complaint  Patient presents with  . Urinary Frequency  . Urinary Urgency  . Back Pain  . Shoulder Pain    Bilateral    This visit occurred during the SARS-CoV-2 public health emergency.  Safety protocols were in place, including screening questions prior to the visit, additional usage of staff PPE, and extensive cleaning of exam room while observing appropriate contact time as indicated for disinfecting solutions.   Subjective:   [name] is a 80 y.o. very pleasant female patient with Body mass index is 27.59 kg/m. who presents with the following:  Had a UTI past. Has some urgency. Worries about UTI or pyelonephritis.  She does not have any blood in her urine.  She only has some minimal dysuria. She does have some urgency with urination.  She is not having any flank pain. She does have some pain in her back.  Back pain - low back pain. This does not radiate Hit on his r side.   Relatively recent low back pain. LBP - acute on chronic for at least 3 months, and she has had some back pain previously in the past.  She does not have a history of any form of major back surgery or intervention.  She also has some complaints of some bilateral shoulder pain, but this is been longstanding for many years.  Review of Systems is noted in the HPI, as appropriate  Objective:   BP 100/64   Pulse 82   Temp 97.6 F (36.4 C) (Temporal)   Ht 5\' 4"  (1.626 m)   Wt 160 lb 12 oz (72.9 kg)   SpO2 98%   BMI 27.59 kg/m   GEN: No acute distress; alert,appropriate. PULM: Breathing comfortably in no  respiratory distress PSYCH: Normally interactive.   ABD: S, NT, ND, + BS, No rebound, No HSM  No CVAT   Range of motion at  the waist: Flexion: normal Extension: normal Lateral bending: normal Rotation: all normal  Inspection/Deformity: N Paraspinus Tenderness: She does have some tenderness from approximately L3-S1 bilaterally, right greater than left  B Ankle Dorsiflexion (L5,4): 5/5 B Great Toe Dorsiflexion (L5,4): 5/5 Heel Walk (L5): WNL Toe Walk (S1): WNL Rise/Squat (L4): WNL  SENSORY B Medial Foot (L4): WNL B Dorsum (L5): WNL B Lateral (S1): WNL Light Touch: WNL Pinprick: WNL  REFLEXES Knee (L4): 2+ Ankle (S1): 2+  B SLR, seated: neg B SLR, supine: neg B FABER: neg B Reverse FABER: neg B Greater Troch: NT B Log Roll: neg B Stork: NT B Sciatic Notch: NT  Laboratory and Imaging Data: Results for orders placed or performed in visit on 03/05/20  Urine Culture   Specimen: Urine  Result Value Ref Range   MICRO NUMBER: [id number]    SPECIMEN QUALITY: Adequate    Sample Source URINE, CLEAN CATCH    STATUS: FINAL    ISOLATE 1:      Growth of mixed flora was isolated, suggesting probable contamination. No further testing will be performed. If clinically indicated, recollection using a method to minimize contamination, with prompt transfer to Urine Culture Transport Tube, is  recommended.   POCT Urinalysis Dipstick (Automated)  Result Value Ref Range   Color, UA Yellow    Clarity, UA Clear    Glucose, UA Negative Negative   Bilirubin, UA Negative    Ketones, UA Negative    Spec Grav, UA >=1.030 (A) 1.010 - 1.025   Blood, UA Trace    pH, UA 5.0 5.0 - 8.0   Protein, UA Negative Negative   Urobilinogen, UA 0.2 0.2 or 1.0 E.U./dL   Nitrite, UA Negative    Leukocytes, UA Negative Negative     Assessment and Plan:     ICD-10-CM   1. Acute back pain, unspecified back location, unspecified back pain laterality  M54.9   2. Urinary frequency  R35.0 POCT  Urinalysis Dipstick (Automated)    Urine Culture   Her urine culture is contaminated.  Not entirely helpful.  I am going to go ahead and treat for suggested clinical UTI.  Pyelonephritis is not suspected.  Acute on chronic low back pain.  I think that this is her predominant source of her back pain.  Patient Instructions  Take Tylenol/Acetaminophen ES (500mg ) 2 tabs by mouth three times a day max as needed.  I think using some ice and heath as you need right now  I will let you know about your urine culture in a couple of days    Meds ordered this encounter  Medications  . nitrofurantoin, macrocrystal-monohydrate, (MACROBID) 100 MG capsule    Sig: Take 1 capsule (100 mg total) by mouth 2 (two) times daily for 7 days.    Dispense:  14 capsule    Refill:  0   Medications Discontinued During This Encounter  Medication Reason  . ondansetron (ZOFRAN) 4 MG tablet Completed Course   Orders Placed This Encounter  Procedures  . Urine Culture  . POCT Urinalysis Dipstick (Automated)    Follow-up: No follow-ups on file.  Signed,  Maud Deed. [name], MD   Outpatient Encounter Medications as of 03/05/2020  Medication Sig  . Ascorbic Acid (VITAMIN C PO) Take by mouth.  Marland Kitchen atorvastatin (LIPITOR) 40 MG tablet Take 1 tablet (40 mg total) by mouth daily.  Marland Kitchen b complex vitamins tablet Take 1 tablet by mouth daily.  . Cholecalciferol (VITAMIN D) 2000 units tablet Take 2,000 Units by mouth 3 (three) times a week. Takes 5000 units  . ELIQUIS 5 MG TABS tablet TAKE 1 TABLET BY MOUTH TWICE A DAY  . Multiple Vitamin (MULTIVITAMIN WITH MINERALS) TABS tablet Take 1 tablet by mouth daily.   . nitrofurantoin, macrocrystal-monohydrate, (MACROBID) 100 MG capsule Take 1 capsule (100 mg total) by mouth 2 (two) times daily for 7 days.  . potassium chloride SA (K-DUR,KLOR-CON) 20 MEQ tablet Take 1 tablet (20 mEq total) by mouth daily. (Patient not taking: No sig reported)  . traMADol (ULTRAM) 50 MG tablet  Take 1 tablet (50 mg total) by mouth every 6 (six) hours as needed for moderate pain or severe pain. (Patient not taking: No sig reported)  . [DISCONTINUED] ondansetron (ZOFRAN) 4 MG tablet Take 1 tablet (4 mg total) by mouth every 6 (six) hours as needed for nausea or vomiting. (Patient not taking: No sig reported)   No facility-administered encounter medications on file as of 03/05/2020.

## 2020-03-05 NOTE — Patient Instructions (Signed)
Take Tylenol/Acetaminophen ES (500mg ) 2 tabs by mouth three times a day max as needed.  I think using some ice and heath as you need right now  I will let you know about your urine culture in a couple of days

## 2020-03-06 LAB — URINE CULTURE
MICRO NUMBER:: 11409989
SPECIMEN QUALITY:: ADEQUATE

## 2020-03-07 MED ORDER — NITROFURANTOIN MONOHYD MACRO 100 MG PO CAPS: 100.0000 mg | ORAL_CAPSULE | Freq: Two times a day (BID) | ORAL | 0 refills | Status: AC

## 2020-03-25 ENCOUNTER — Telehealth: Payer: Self-pay | Admitting: Family Medicine

## 2020-03-25 DIAGNOSIS — M810 Age-related osteoporosis without current pathological fracture: Secondary | ICD-10-CM

## 2020-03-25 DIAGNOSIS — R7303 Prediabetes: Secondary | ICD-10-CM

## 2020-03-25 DIAGNOSIS — E7800 Pure hypercholesterolemia, unspecified: Secondary | ICD-10-CM

## 2020-03-25 DIAGNOSIS — E559 Vitamin D deficiency, unspecified: Secondary | ICD-10-CM

## 2020-03-25 DIAGNOSIS — I10 Essential (primary) hypertension: Secondary | ICD-10-CM

## 2020-03-25 NOTE — Telephone Encounter (Signed)
-----   Message from Ellamae Sia sent at 03/11/2020  3:00 PM EST ----- Regarding: Lab orders for Wednesday, 2.2.22 Patient is scheduled for CPX labs, please order future labs, Thanks , Karna Christmas

## 2020-03-26 ENCOUNTER — Ambulatory Visit (INDEPENDENT_AMBULATORY_CARE_PROVIDER_SITE_OTHER): Payer: Medicare HMO

## 2020-03-26 ENCOUNTER — Other Ambulatory Visit (INDEPENDENT_AMBULATORY_CARE_PROVIDER_SITE_OTHER): Payer: Medicare HMO

## 2020-03-26 ENCOUNTER — Other Ambulatory Visit: Payer: Self-pay

## 2020-03-26 DIAGNOSIS — E559 Vitamin D deficiency, unspecified: Secondary | ICD-10-CM

## 2020-03-26 DIAGNOSIS — E7800 Pure hypercholesterolemia, unspecified: Secondary | ICD-10-CM | POA: Diagnosis not present

## 2020-03-26 DIAGNOSIS — I10 Essential (primary) hypertension: Secondary | ICD-10-CM | POA: Diagnosis not present

## 2020-03-26 DIAGNOSIS — R7303 Prediabetes: Secondary | ICD-10-CM

## 2020-03-26 DIAGNOSIS — Z0000 Encounter for general adult medical examination without abnormal findings: Secondary | ICD-10-CM

## 2020-03-26 NOTE — Progress Notes (Signed)
Subjective:   [name] is a 81 y.o. female who presents for Medicare Annual (Subsequent) preventive examination.  Review of Systems: N/A      I connected with the patient today by telephone and verified that I am speaking with the correct person using two identifiers. Location patient: home Location nurse: work Persons participating in the telephone visit: patient, nurse.   I discussed the limitations, risks, security and privacy concerns of performing an evaluation and management service by telephone and the availability of in person appointments. I also discussed with the patient that there may be a patient responsible charge related to this service. The patient expressed understanding and verbally consented to this telephonic visit.        Cardiac Risk Factors include: advanced age (>97men, >75 women);hypertension     Objective:    Today's Vitals   03/26/20 1112  PainSc: 5    There is no height or weight on file to calculate BMI.  Advanced Directives 03/26/2020 12/12/2019 11/21/2019 11/16/2019 01/31/2018 05/16/2017 04/11/2017  Does Patient Have a Medical Advance Directive? No No No No No No No  Does patient want to make changes to medical advance directive? - No - Patient declined - - No - Patient declined - -  Would patient like information on creating a medical advance directive? No - Patient declined - No - Patient declined No - Patient declined - - -    Current Medications (verified) Outpatient Encounter Medications as of 03/26/2020  Medication Sig  . Ascorbic Acid (VITAMIN C PO) Take by mouth.  Marland Kitchen atorvastatin (LIPITOR) 40 MG tablet Take 1 tablet (40 mg total) by mouth daily.  Marland Kitchen b complex vitamins tablet Take 1 tablet by mouth daily.  . Cholecalciferol (VITAMIN D) 2000 units tablet Take 2,000 Units by mouth 3 (three) times a week. Takes 5000 units  . ELIQUIS 5 MG TABS tablet TAKE 1 TABLET BY MOUTH TWICE A DAY  . Multiple Vitamin (MULTIVITAMIN WITH MINERALS)  TABS tablet Take 1 tablet by mouth daily.   . potassium chloride SA (K-DUR,KLOR-CON) 20 MEQ tablet Take 1 tablet (20 mEq total) by mouth daily.  . traMADol (ULTRAM) 50 MG tablet Take 1 tablet (50 mg total) by mouth every 6 (six) hours as needed for moderate pain or severe pain.   No facility-administered encounter medications on file as of 03/26/2020.    Allergies (verified) Salmon [fish allergy], Ampicillin, Codeine, Flagyl [metronidazole], Meperidine hcl, Neomycin, Propoxyphene hcl, Sulfa antibiotics, and Tramadol hcl   History: Past Medical History:  Diagnosis Date  . Anemia   . Arthritis   . Astigmatism    both eyes  . Cancer [facility])    colon surgery done last chemo tx 01-17-17  . Cataract   . Colon cancer ([place])   . Coronary artery disease, non-occlusive   . Dyspnea    more so exertion  . Dysrhythmia    skipped beats every now and then  . GERD (gastroesophageal reflux disease)   . Glaucoma    both eyes right eye worse than left  . Hammer toes of both feet   . Headache    migraines with aura  . Heel spur    both heels and front  . HSV (herpes simplex virus) infection   . Hyperglycemia    denies  . Hyperlipidemia   . IBS (irritable bowel syndrome)   . IC (interstitial cystitis)   . Insomnia disorder related to known organic factor   . Osteoporosis   .  PONV (postoperative nausea and vomiting)    has had more surgeries, and had no problems  . Stroke ([place]) 2016  . Vitamin D deficiency    Past Surgical History:  Procedure Laterality Date  . ABDOMINAL HYSTERECTOMY     1 ovary and womb removed  . CARDIAC CATHETERIZATION  7/12   non obst dz -- not sure what yr  . CHOLECYSTECTOMY    . EUS N/A 02/03/2017   Procedure: UPPER ENDOSCOPIC ULTRASOUND (EUS) LINEAR;  Surgeon: [name], MD;  Location: [facility];  Service: Endoscopy;  Laterality: N/A;  . EYE SURGERY Bilateral    ioc for cataracts   . LAPAROSCOPIC RIGHT COLECTOMY Right 11/10/2016   Procedure:  LAPAROSCOPIC EXTENDED RIGHT COLECTOMY;  Surgeon: [name], MD;  Location: [place];  Service: General;  Laterality: Right;  . PORT-A-CATH REMOVAL Left 11/21/2019   Procedure: REMOVAL PORT-A-CATH;  Surgeon: [name], MD;  Location: [facility];  Service: General;  Laterality: Left;  . PORTACATH PLACEMENT Left 12/22/2016   Procedure: INSERTION PORT-A-CATH;  Surgeon: [name], MD;  Location: [facility];  Service: General;  Laterality: Left;  . ROTATOR CUFF REPAIR Bilateral    Family History  Problem Relation Age of Onset  . Alcohol abuse Mother   . Heart attack Mother   . GER disease Mother   . GER disease Father   . Alcohol abuse Father   . Stroke Father   . Tuberculosis Father   . Heart attack Father   . Aneurysm Father   . Mental illness Sister   . Depression Sister   . Breast cancer Neg Hx    Social History   Socioeconomic History  . Marital status: Married    Spouse name: Not on file  . Number of children: 4  . Years of education: Not on file  . Highest education level: Not on file  Occupational History  . Occupation: reitred    Employer: RETIRED  Tobacco Use  . Smoking status: Never Smoker  . Smokeless tobacco: Never Used  Vaping Use  . Vaping Use: Never used  Substance and Sexual Activity  . Alcohol use: No    Alcohol/week: 0.0 standard drinks  . Drug use: No  . Sexual activity: Not on file  Other Topics Concern  . Not on file  Social History Narrative   Rides horses         Specialty providers    [facility]   Cardio- [facility]   Ortho--[place]   Rheum-[facility]   urol-[facility]   ENT-- [employer]   Social Determinants of Health   Financial Resource Strain: Low Risk   . Difficulty of Paying Living Expenses: Not hard at all  Food Insecurity: No Food Insecurity  . Worried About Charity fundraiser in the Last Year: Never true  . Ran Out of Food in the Last Year: Never true  Transportation Needs: No Transportation  Needs  . Lack of Transportation (Medical): No  . Lack of Transportation (Non-Medical): No  Physical Activity: Inactive  . Days of Exercise per Week: 0 days  . Minutes of Exercise per Session: 0 min  Stress: No Stress Concern Present  . Feeling of Stress : Not at all  Social Connections: Not on file    Tobacco Counseling Counseling given: Not Answered   Clinical Intake:  Pre-visit preparation completed: Yes  Pain : 0-10 Pain Score: 5  Pain Type: Chronic pain Pain Location:  (muscles) Pain Descriptors / Indicators: Sore  Pain Onset: More than a month ago Pain Frequency: Intermittent     Nutritional Risks: None Diabetes: No  How often do you need to have someone help you when you read instructions, pamphlets, or other written materials from your doctor or pharmacy?: 1 - Never What is the last grade level you completed in school?: GED  Diabetic: No Nutrition Risk Assessment:  Has the patient had any N/V/D within the last 2 months?  No  Does the patient have any non-healing wounds?  No  Has the patient had any unintentional weight loss or weight gain?  No   Diabetes:  Is the patient diabetic?  No  If diabetic, was a CBG obtained today?  N/A Did the patient bring in their glucometer from home?  N/A How often do you monitor your CBG's? N/A.   Financial Strains and Diabetes Management:  Are you having any financial strains with the device, your supplies or your medication? N/A.  Does the patient want to be seen by Chronic Care Management for management of their diabetes?  N/A Would the patient like to be referred to a Nutritionist or for Diabetic Management?  N/A    Interpreter Needed?: No  Information entered by :: CJohnson, LPN   Activities of Daily Living In your present state of health, do you have any difficulty performing the following activities: 03/26/2020 11/21/2019  Hearing? N N  Vision? N N  Difficulty concentrating or making decisions? N N  Walking  or climbing stairs? N N  Comment - goes slow  Dressing or bathing? N N  Doing errands, shopping? N -  Preparing Food and eating ? N -  Using the Toilet? N -  In the past six months, have you accidently leaked urine? N -  Do you have problems with loss of bowel control? N -  Managing your Medications? N -  Managing your Finances? N -  Housekeeping or managing your Housekeeping? N -  Some recent data might be hidden    Patient Care Team: Tower, [name], MD as PCP - General [name], MD as Consulting Physician (Hematology) [name], MD as Attending Physician (Gastroenterology) [name], MD as Consulting Physician (General Surgery)  Indicate any recent Medical Services you may have received from other than [facility] providers in the past year (date may be approximate).     Assessment:   This is a routine wellness examination for [name].  Hearing/Vision screen  Hearing Screening   125Hz  250Hz  500Hz  1000Hz  2000Hz  3000Hz  4000Hz  6000Hz  8000Hz   Right ear:           Left ear:           Vision Screening Comments: Patient gets annual eye exams   Dietary issues and exercise activities discussed: Current Exercise Habits: The patient does not participate in regular exercise at present, Exercise limited by: None identified  Goals    . Increase water intake     Starting 07/28/16, I will continue to drink at least 6-8 glasses of water daily.     . Patient Stated     03/26/2020, I will maintain and continue medications as prescribed.       Depression Screen PHQ 2/9 Scores 03/26/2020 08/01/2017 07/28/2016  PHQ - 2 Score 0 1 0  PHQ- 9 Score 0 6 -    Fall Risk Fall Risk  03/26/2020 08/01/2017 07/28/2016  Falls in the past year? 1 Yes Yes  Comment - - pt reports fall with injury but no  medical treatment  Number falls in past yr: 0 2 or more 1  Injury with Fall? 0 No No  Risk for fall due to : Medication side effect - -  Follow up Falls evaluation completed;Falls prevention discussed  Falls evaluation completed -    FALL RISK PREVENTION PERTAINING TO THE HOME:  Any stairs in or around the home? Yes  If so, are there any without handrails? No  Home free of loose throw rugs in walkways, pet beds, electrical cords, etc? Yes  Adequate lighting in your home to reduce risk of falls? Yes   ASSISTIVE DEVICES UTILIZED TO PREVENT FALLS:  Life alert? No  Use of a cane, walker or w/c? No  Grab bars in the bathroom? No  Shower chair or bench in shower? Yes  Elevated toilet seat or a handicapped toilet? No   TIMED UP AND GO:  Was the test performed? N/A telephone visit.    Cognitive Function: MMSE - Mini Mental State Exam 03/26/2020 07/28/2016  Orientation to time 5 5  Orientation to Place 5 5  Registration 3 3  Attention/ Calculation 5 0  Recall 3 3  Language- name 2 objects - 0  Language- repeat 1 1  Language- follow 3 step command - 3  Language- read & follow direction - 0  Write a sentence - 0  Copy design - 0  Total score - 20  Mini Cog  Mini-Cog screen was completed. Maximum score is 22. A value of 0 denotes this part of the MMSE was not completed or the patient failed this part of the Mini-Cog screening.       Immunizations Immunization History  Administered Date(s) Administered  . Fluad Quad(high Dose 65+) 12/04/2018, 11/15/2019  . Influenza Split 12/29/2010, 11/18/2011  . Influenza, High Dose Seasonal PF 12/29/2016, 01/14/2018  . Influenza,inj,Quad PF,6+ Mos 03/02/2013  . Influenza-Unspecified 11/22/2013, 11/23/2015  . PFIZER(Purple Top)SARS-COV-2 Vaccination 06/02/2019, 06/23/2019, 03/07/2020  . Pneumococcal Conjugate-13 07/28/2016  . Pneumococcal Polysaccharide-23 04/30/2008  . Td 04/30/2008, 04/17/2014  . Zoster 06/11/2010    TDAP status: Up to date  Flu Vaccine status: Up to date  Pneumococcal vaccine status: Up to date  Covid-19 vaccine status: Completed vaccines  Qualifies for Shingles Vaccine? Yes   Zostavax completed Yes    Shingrix Completed?: No.    Education has been provided regarding the importance of this vaccine. Patient has been advised to call insurance company to determine out of pocket expense if they have not yet received this vaccine. Advised may also receive vaccine at local pharmacy or Health Dept. Verbalized acceptance and understanding.  Screening Tests Health Maintenance  Topic Date Due  . COVID-19 Vaccine (4 - Booster for Pfizer series) 09/04/2020  . COLONOSCOPY (Pts 45-39yrs Insurance coverage will need to be confirmed)  10/18/2020  . TETANUS/TDAP  04/17/2024  . INFLUENZA VACCINE  Completed  . DEXA SCAN  Completed  . PNA vac Low Risk Adult  Completed    Health Maintenance  There are no preventive care reminders to display for this patient.  Colorectal cancer screening: Type of screening: Colonoscopy. Completed 10/18/2017. Repeat every 3 years  Mammogram status: No longer required due to age.  Bone Density status: Completed 04/02/2019. Results reflect: Bone density results: OSTEOPOROSIS. Repeat every 2 years.  Lung Cancer Screening: (Low Dose CT Chest recommended if Age 63-80 years, 30 pack-year currently smoking OR have quit w/in 15years.) does not qualify.    Additional Screening:  Hepatitis C Screening: does not qualify; Completed N/A  Vision Screening: Recommended annual ophthalmology exams for early detection of glaucoma and other disorders of the eye. Is the patient up to date with their annual eye exam?  Yes  Who is the provider or what is the name of the office in which the patient attends annual eye exams? Dr. Bing Plume If pt is not established with a provider, would they like to be referred to a provider to establish care? No .   Dental Screening: Recommended annual dental exams for proper oral hygiene  Community Resource Referral / Chronic Care Management: CRR required this visit?  No   CCM required this visit?  No      Plan:     I have personally reviewed and  noted the following in the patient's chart:   . Medical and social history . Use of alcohol, tobacco or illicit drugs  . Current medications and supplements . Functional ability and status . Nutritional status . Physical activity . Advanced directives . List of other physicians . Hospitalizations, surgeries, and ER visits in previous 12 months . Vitals . Screenings to include cognitive, depression, and falls . Referrals and appointments  In addition, I have reviewed and discussed with patient certain preventive protocols, quality metrics, and best practice recommendations. A written personalized care plan for preventive services as well as general preventive health recommendations were provided to patient.   Due to this being a telephonic visit, the after visit summary with patients personalized plan was offered to patient via office or my-chart. Patient preferred to pick up at office at next visit or via mychart.   Andrez Grime, LPN   06/26/5623

## 2020-03-26 NOTE — Patient Instructions (Signed)
[name] , Thank you for taking time to come for your Medicare Wellness Visit. I appreciate your ongoing commitment to your health goals. Please review the following plan we discussed and let me know if I can assist you in the future.   Screening recommendations/referrals: Colonoscopy: Up to date, completed 10/18/2017, due 09/2020 Mammogram: no longer required Bone Density: Up to date, completed 04/02/2019, due 03/2021 Recommended yearly ophthalmology/optometry visit for glaucoma screening and checkup Recommended yearly dental visit for hygiene and checkup  Vaccinations: Influenza vaccine: Up to date, completed 11/15/2019, due 09/2020 Pneumococcal vaccine: Completed series Tdap vaccine: Up to date, completed 04/17/2014, due 03/2024 Shingles vaccine: due, check with your insurance regarding coverage if interested    Covid-19:Completed series  Advanced directives: Advance directive discussed with you today. Even though you declined this today please call our office should you change your mind and we can give you the proper paperwork for you to fill out.  Conditions/risks identified: hypertension  Next appointment: Follow up in one year for your annual wellness visit    Preventive Care 58 Years and Older, Female Preventive care refers to lifestyle choices and visits with your health care provider that can promote health and wellness. What does preventive care include?  A yearly physical exam. This is also called an annual well check.  Dental exams once or twice a year.  Routine eye exams. Ask your health care provider how often you should have your eyes checked.  Personal lifestyle choices, including:  Daily care of your teeth and gums.  Regular physical activity.  Eating a healthy diet.  Avoiding tobacco and drug use.  Limiting alcohol use.  Practicing safe sex.  Taking low-dose aspirin every day.  Taking vitamin and mineral supplements as recommended by your health care  provider. What happens during an annual well check? The services and screenings done by your health care provider during your annual well check will depend on your age, overall health, lifestyle risk factors, and family history of disease. Counseling  Your health care provider may ask you questions about your:  Alcohol use.  Tobacco use.  Drug use.  Emotional well-being.  Home and relationship well-being.  Sexual activity.  Eating habits.  History of falls.  Memory and ability to understand (cognition).  Work and work Statistician.  Reproductive health. Screening  You may have the following tests or measurements:  Height, weight, and BMI.  Blood pressure.  Lipid and cholesterol levels. These may be checked every 5 years, or more frequently if you are over 14 years old.  Skin check.  Lung cancer screening. You may have this screening every year starting at age 38 if you have a 30-pack-year history of smoking and currently smoke or have quit within the past 15 years.  Fecal occult blood test (FOBT) of the stool. You may have this test every year starting at age 90.  Flexible sigmoidoscopy or colonoscopy. You may have a sigmoidoscopy every 5 years or a colonoscopy every 10 years starting at age 26.  Hepatitis C blood test.  Hepatitis B blood test.  Sexually transmitted disease (STD) testing.  Diabetes screening. This is done by checking your blood sugar (glucose) after you have not eaten for a while (fasting). You may have this done every 1-3 years.  Bone density scan. This is done to screen for osteoporosis. You may have this done starting at age 35.  Mammogram. This may be done every 1-2 years. Talk to your health care provider about how  often you should have regular mammograms. Talk with your health care provider about your test results, treatment options, and if necessary, the need for more tests. Vaccines  Your health care provider may recommend certain  vaccines, such as:  Influenza vaccine. This is recommended every year.  Tetanus, diphtheria, and acellular pertussis (Tdap, Td) vaccine. You may need a Td booster every 10 years.  Zoster vaccine. You may need this after age 15.  Pneumococcal 13-valent conjugate (PCV13) vaccine. One dose is recommended after age 78.  Pneumococcal polysaccharide (PPSV23) vaccine. One dose is recommended after age 56. Talk to your health care provider about which screenings and vaccines you need and how often you need them. This information is not intended to replace advice given to you by your health care provider. Make sure you discuss any questions you have with your health care provider. Document Released: 03/07/2015 Document Revised: 10/29/2015 Document Reviewed: 12/10/2014 Elsevier Interactive Patient Education  2017 [place] Prevention in the Home Falls can cause injuries. They can happen to people of all ages. There are many things you can do to make your home safe and to help prevent falls. What can I do on the outside of my home?  Regularly fix the edges of walkways and driveways and fix any cracks.  Remove anything that might make you trip as you walk through a door, such as a raised step or threshold.  Trim any bushes or trees on the path to your home.  Use bright outdoor lighting.  Clear any walking paths of anything that might make someone trip, such as rocks or tools.  Regularly check to see if handrails are loose or broken. Make sure that both sides of any steps have handrails.  Any raised decks and porches should have guardrails on the edges.  Have any leaves, snow, or ice cleared regularly.  Use sand or salt on walking paths during winter.  Clean up any spills in your garage right away. This includes oil or grease spills. What can I do in the bathroom?  Use night lights.  Install grab bars by the toilet and in the tub and shower. Do not use towel bars as grab  bars.  Use non-skid mats or decals in the tub or shower.  If you need to sit down in the shower, use a plastic, non-slip stool.  Keep the floor dry. Clean up any water that spills on the floor as soon as it happens.  Remove soap buildup in the tub or shower regularly.  Attach bath mats securely with double-sided non-slip rug tape.  Do not have throw rugs and other things on the floor that can make you trip. What can I do in the bedroom?  Use night lights.  Make sure that you have a light by your bed that is easy to reach.  Do not use any sheets or blankets that are too big for your bed. They should not hang down onto the floor.  Have a firm chair that has side arms. You can use this for support while you get dressed.  Do not have throw rugs and other things on the floor that can make you trip. What can I do in the kitchen?  Clean up any spills right away.  Avoid walking on wet floors.  Keep items that you use a lot in easy-to-reach places.  If you need to reach something above you, use a strong step stool that has a grab bar.  Keep electrical  cords out of the way.  Do not use floor polish or wax that makes floors slippery. If you must use wax, use non-skid floor wax.  Do not have throw rugs and other things on the floor that can make you trip. What can I do with my stairs?  Do not leave any items on the stairs.  Make sure that there are handrails on both sides of the stairs and use them. Fix handrails that are broken or loose. Make sure that handrails are as long as the stairways.  Check any carpeting to make sure that it is firmly attached to the stairs. Fix any carpet that is loose or worn.  Avoid having throw rugs at the top or bottom of the stairs. If you do have throw rugs, attach them to the floor with carpet tape.  Make sure that you have a light switch at the top of the stairs and the bottom of the stairs. If you do not have them, ask someone to add them for  you. What else can I do to help prevent falls?  Wear shoes that:  Do not have high heels.  Have rubber bottoms.  Are comfortable and fit you well.  Are closed at the toe. Do not wear sandals.  If you use a stepladder:  Make sure that it is fully opened. Do not climb a closed stepladder.  Make sure that both sides of the stepladder are locked into place.  Ask someone to hold it for you, if possible.  Clearly mark and make sure that you can see:  Any grab bars or handrails.  First and last steps.  Where the edge of each step is.  Use tools that help you move around (mobility aids) if they are needed. These include:  Canes.  Walkers.  Scooters.  Crutches.  Turn on the lights when you go into a dark area. Replace any light bulbs as soon as they burn out.  Set up your furniture so you have a clear path. Avoid moving your furniture around.  If any of your floors are uneven, fix them.  If there are any pets around you, be aware of where they are.  Review your medicines with your doctor. Some medicines can make you feel dizzy. This can increase your chance of falling. Ask your doctor what other things that you can do to help prevent falls. This information is not intended to replace advice given to you by your health care provider. Make sure you discuss any questions you have with your health care provider. Document Released: 12/05/2008 Document Revised: 07/17/2015 Document Reviewed: 03/15/2014 Elsevier Interactive Patient Education  2017 [employer].

## 2020-03-26 NOTE — Progress Notes (Signed)
PCP notes:  Health Maintenance: No gaps noted   Abnormal Screenings: none   Patient concerns: Urinary issues  Muscle aches from cholesterol med Check ears for wax    Nurse concerns: none   Next PCP appt: 04/02/2020 @ 11 am

## 2020-03-27 LAB — COMPREHENSIVE METABOLIC PANEL
ALT: 15 U/L (ref 0–35)
AST: 16 U/L (ref 0–37)
Albumin: 4.1 g/dL (ref 3.5–5.2)
Alkaline Phosphatase: 71 U/L (ref 39–117)
BUN: 21 mg/dL (ref 6–23)
CO2: 30 mEq/L (ref 19–32)
Calcium: 9.6 mg/dL (ref 8.4–10.5)
Chloride: 107 mEq/L (ref 96–112)
Creatinine, Ser: 1.09 mg/dL (ref 0.40–1.20)
GFR: 47.59 mL/min — ABNORMAL LOW (ref >60.00)
Glucose, Bld: 95 mg/dL (ref 70–99)
Potassium: 3.9 mEq/L (ref 3.5–5.1)
Sodium: 142 mEq/L (ref 135–145)
Total Bilirubin: 0.6 mg/dL (ref 0.2–1.2)
Total Protein: 6.6 g/dL (ref 6.0–8.3)

## 2020-03-27 LAB — HEMOGLOBIN A1C: Hgb A1c MFr Bld: 6.1 % (ref 4.6–6.5)

## 2020-03-27 LAB — CBC WITH DIFFERENTIAL/PLATELET
Basophils Absolute: 0.1 10*3/uL (ref 0.0–0.1)
Basophils Relative: 0.9 % (ref 0.0–3.0)
Eosinophils Absolute: 0.1 10*3/uL (ref 0.0–0.7)
Eosinophils Relative: 1.9 % (ref 0.0–5.0)
HCT: 38.4 % (ref 36.0–46.0)
Hemoglobin: 12.7 g/dL (ref 12.0–15.0)
Lymphocytes Relative: 27.6 % (ref 12.0–46.0)
Lymphs Abs: 2 10*3/uL (ref 0.7–4.0)
MCHC: 33 g/dL (ref 30.0–36.0)
MCV: 91.4 fl (ref 78.0–100.0)
Monocytes Absolute: 0.7 10*3/uL (ref 0.1–1.0)
Monocytes Relative: 10.2 % (ref 3.0–12.0)
Neutro Abs: 4.3 10*3/uL (ref 1.4–7.7)
Neutrophils Relative %: 59.4 % (ref 43.0–77.0)
Platelets: 253 10*3/uL (ref 150.0–400.0)
RBC: 4.2 Mil/uL (ref 3.87–5.11)
RDW: 13.2 % (ref 11.5–15.5)
WBC: 7.2 10*3/uL (ref 4.0–10.5)

## 2020-03-27 LAB — LIPID PANEL
Cholesterol: 137 mg/dL (ref 0–200)
HDL: 72.6 mg/dL (ref >39.00)
LDL Cholesterol: 50 mg/dL (ref 0–99)
NonHDL: 64.77
Total CHOL/HDL Ratio: 2
Triglycerides: 75 mg/dL (ref 0.0–149.0)
VLDL: 15 mg/dL (ref 0.0–40.0)

## 2020-03-27 LAB — VITAMIN D 25 HYDROXY (VIT D DEFICIENCY, FRACTURES): VITD: 31.87 ng/mL (ref 30.00–100.00)

## 2020-03-27 LAB — TSH: TSH: 2.84 u[IU]/mL (ref 0.35–4.50)

## 2020-04-02 ENCOUNTER — Encounter: Payer: Self-pay | Admitting: Family Medicine

## 2020-04-02 ENCOUNTER — Other Ambulatory Visit: Payer: Self-pay

## 2020-04-02 ENCOUNTER — Ambulatory Visit (INDEPENDENT_AMBULATORY_CARE_PROVIDER_SITE_OTHER): Payer: Medicare HMO | Admitting: Family Medicine

## 2020-04-02 VITALS — BP 116/62 | HR 99 | Temp 96.9°F | Ht 63.5 in | Wt 160.5 lb

## 2020-04-02 DIAGNOSIS — Z0000 Encounter for general adult medical examination without abnormal findings: Secondary | ICD-10-CM | POA: Diagnosis not present

## 2020-04-02 DIAGNOSIS — D689 Coagulation defect, unspecified: Secondary | ICD-10-CM | POA: Diagnosis not present

## 2020-04-02 DIAGNOSIS — E559 Vitamin D deficiency, unspecified: Secondary | ICD-10-CM | POA: Diagnosis not present

## 2020-04-02 DIAGNOSIS — M810 Age-related osteoporosis without current pathological fracture: Secondary | ICD-10-CM | POA: Diagnosis not present

## 2020-04-02 DIAGNOSIS — I700 Atherosclerosis of aorta: Secondary | ICD-10-CM | POA: Insufficient documentation

## 2020-04-02 DIAGNOSIS — R7303 Prediabetes: Secondary | ICD-10-CM | POA: Diagnosis not present

## 2020-04-02 DIAGNOSIS — Z85038 Personal history of other malignant neoplasm of large intestine: Secondary | ICD-10-CM | POA: Insufficient documentation

## 2020-04-02 DIAGNOSIS — E7800 Pure hypercholesterolemia, unspecified: Secondary | ICD-10-CM | POA: Diagnosis not present

## 2020-04-02 DIAGNOSIS — I10 Essential (primary) hypertension: Secondary | ICD-10-CM | POA: Diagnosis not present

## 2020-04-02 MED ORDER — ROSUVASTATIN CALCIUM 20 MG PO TABS: 20.0000 mg | ORAL_TABLET | Freq: Every day | ORAL | 3 refills | Status: DC

## 2020-04-02 NOTE — Assessment & Plan Note (Signed)
dexa 2/21  No new falls or fratcures Pt declines medication /alendronate over fear of side eff Takes ca and D  Good exercise also  Fall prevention discussed (it is worrisome that she continues to ride horses)  Plan to re check dexa in a year  Handout given re: OP

## 2020-04-02 NOTE — Assessment & Plan Note (Signed)
bp in fair control at this time  BP Readings from Last 1 Encounters:  04/02/20 116/62   No medication needed at this time  Most recent labs reviewed  Disc lifstyle change with low sodium diet and exercise

## 2020-04-02 NOTE — Progress Notes (Signed)
Subjective:    Patient ID: [name], female    DOB: [birth year], 80 y.o.   MRN: [id number]  This visit occurred during the SARS-CoV-2 public health emergency.  Safety protocols were in place, including screening questions prior to the visit, additional usage of staff PPE, and extensive cleaning of exam room while observing appropriate contact time as indicated for disinfecting solutions.    HPI Here for health maintenance exam and to review chronic medical problems    Wt Readings from Last 3 Encounters:  04/02/20 160 lb 8 oz (72.8 kg)  03/05/20 160 lb 12 oz (72.9 kg)  12/12/19 155 lb 9.6 oz (70.6 kg)   27.99 kg/m  Doing fairly well overall  Staying busy- taking care of horses and herself  Very physically active   Ate poorly yesterday- fried fish and BBQ Does not eat enough vegetables to takes mvi , biotin  She stays on her K   Had amw visit on 2/2  No gaps noted   covid immunized with booster  zostavax 4/12  Colonoscopy 8/19  Personal h/o colon cancer  3 y recall will be due in aug -she is aware   mammogram - not up to date  Self breast exam - no lumps   dexa 2/21 - OP  (she is afraid of medicine for this)  Falls -none since last one  Fractures-none  Supplements - taking ca and D  D level 31.8 Exercise- cleans horse stalls /has to walk stairs  She does ride horses still  (does not plan to stop that)   HTN bp is stable today  No cp or palpitations or headaches or edema  No side effects to medicines  BP Readings from Last 3 Encounters:  04/02/20 116/62  03/05/20 100/64  12/12/19 (!) 142/72    No medicnes currently  H/o CAD Pulse Readings from Last 3 Encounters:  04/02/20 99  03/05/20 82  12/12/19 65   Takes eliquis for hypercoag state   Prediabetes Lab Results  Component Value Date   HGBA1C 6.1 03/26/2020    Hyperlipidemia Lab Results  Component Value Date   CHOL 137 03/26/2020   CHOL 208 (H) 02/20/2019   CHOL 150 07/28/2017    Lab Results  Component Value Date   HDL 72.60 03/26/2020   HDL 63.80 02/20/2019   HDL 54.40 07/28/2017   Lab Results  Component Value Date   LDLCALC 50 03/26/2020   LDLCALC 122 (H) 02/20/2019   LDLCALC 71 07/28/2017   Lab Results  Component Value Date   TRIG 75.0 03/26/2020   TRIG 112.0 02/20/2019   TRIG 125.0 07/28/2017   Lab Results  Component Value Date   CHOLHDL 2 03/26/2020   CHOLHDL 3 02/20/2019   CHOLHDL 3 07/28/2017   Lab Results  Component Value Date   LDLDIRECT 138.9 07/05/2012   LDLDIRECT 135.4 10/12/2011   LDLDIRECT 132.1 02/02/2010   Atorvastatin 40 mg daily  ? If causing muscle pain  Wants to change to crestor  Other labs Lab Results  Component Value Date   CREATININE 1.09 03/26/2020   BUN 21 03/26/2020   NA 142 03/26/2020   K 3.9 03/26/2020   CL 107 03/26/2020   CO2 30 03/26/2020   Lab Results  Component Value Date   ALT 15 03/26/2020   AST 16 03/26/2020   ALKPHOS 71 03/26/2020   BILITOT 0.6 03/26/2020   Lab Results  Component Value Date   WBC 7.2 03/26/2020   HGB 12.7 03/26/2020  HCT 38.4 03/26/2020   MCV 91.4 03/26/2020   PLT 253.0 03/26/2020   Lab Results  Component Value Date   TSH 2.84 03/26/2020    Patient Active Problem List   Diagnosis Date Noted  . History of colon cancer 04/02/2020  . Atherosclerosis of aorta ([place]) 04/02/2020  . Right shoulder pain 11/15/2019  . Left ear pain 11/15/2019  . 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 Blood clotting disorder ([place]) 06/30/2018  . Fall 10/28/2017  . Abnormality of breast on screening mammography 08/01/2017  . Genetic testing 04/25/2017  . Adenopathy   . History of DVT (deep vein thrombosis) 11/24/2016  . Cancer of overlapping sites of colon ([place]) 09/23/2016  . Estrogen deficiency 07/30/2016  . Screening mammogram, encounter for 07/30/2016  . Routine general medical examination at a health care facility 07/27/2016  . H/O: CVA (cerebrovascular accident) 05/06/2015  . HTN  (hypertension) 05/06/2015  . HLD (hyperlipidemia) 05/06/2015  . GERD (gastroesophageal reflux disease) 05/06/2015  . CAD (coronary artery disease) 05/06/2015  . Seborrheic keratosis 07/05/2012  . 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 Callus of foot 10/16/2010  . Coronary artery disease, non-occlusive 10/16/2010  . Insomnia 02/02/2010  . HAND PAIN, BILATERAL 01/09/2010  . Prediabetes 07/29/2009  . ALLERGIC RHINITIS 12/04/2008  . Vitamin D deficiency 04/30/2008  . COLONIC POLYPS 06/28/2007  . IBS 03/17/2007  . INTERSTITIAL CYSTITIS 03/17/2007  . Osteoporosis 03/17/2007   Past Medical History:  Diagnosis Date  . Anemia   . Arthritis   . Astigmatism    both eyes  . Cancer [facility])    colon surgery done last chemo tx 01-17-17  . Cataract   . Colon cancer ([place])   . Coronary artery disease, non-occlusive   . Dyspnea    more so exertion  . Dysrhythmia    skipped beats every now and then  . GERD (gastroesophageal reflux disease)   . Glaucoma    both eyes right eye worse than left  . Hammer toes of both feet   . Headache    migraines with aura  . Heel spur    both heels and front  . HSV (herpes simplex virus) infection   . Hyperglycemia    denies  . Hyperlipidemia   . IBS (irritable bowel syndrome)   . IC (interstitial cystitis)   . Insomnia disorder related to known organic factor   . Osteoporosis   . PONV (postoperative nausea and vomiting)    has had more surgeries, and had no problems  . Stroke ([place]) 2016  . Vitamin D deficiency    Past Surgical History:  Procedure Laterality Date  . ABDOMINAL HYSTERECTOMY     1 ovary and womb removed  . CARDIAC CATHETERIZATION  7/12   non obst dz -- not sure what yr  . CHOLECYSTECTOMY    . EUS N/A 02/03/2017   Procedure: UPPER ENDOSCOPIC ULTRASOUND (EUS) LINEAR;  Surgeon: [name], MD;  Location: [facility];  Service: Endoscopy;  Laterality: N/A;  . EYE SURGERY Bilateral    ioc for cataracts   . LAPAROSCOPIC RIGHT COLECTOMY  Right 11/10/2016   Procedure: LAPAROSCOPIC EXTENDED RIGHT COLECTOMY;  Surgeon: [name], MD;  Location: [facility];  Service: General;  Laterality: Right;  . PORT-A-CATH REMOVAL Left 11/21/2019   Procedure: REMOVAL PORT-A-CATH;  Surgeon: [name], MD;  Location: [place] [facility];  Service: General;  Laterality: Left;  . PORTACATH PLACEMENT Left 12/22/2016   Procedure: INSERTION PORT-A-CATH;  Surgeon: [name], MD;  Location: [facility];  Service: General;  Laterality: Left;  . ROTATOR CUFF REPAIR Bilateral    Social History   Tobacco Use  . Smoking status: Never Smoker  . Smokeless tobacco: Never Used  Vaping Use  . Vaping Use: Never used  Substance Use Topics  . Alcohol use: No    Alcohol/week: 0.0 standard drinks  . Drug use: No   Family History  Problem Relation Age of Onset  . Alcohol abuse Mother   . Heart attack Mother   . GER disease Mother   . GER disease Father   . Alcohol abuse Father   . Stroke Father   . Tuberculosis Father   . Heart attack Father   . Aneurysm Father   . Mental illness Sister   . Depression Sister   . Breast cancer Neg Hx    Allergies  Allergen Reactions  . Salmon [Fish Allergy] Anaphylaxis    This happens with "CANNED SALMON" only, if eaten uncooked  . Ampicillin Nausea Only    Has patient had a PCN reaction causing immediate rash, facial/tongue/throat swelling, SOB or lightheadedness with hypotension: No Has patient had a PCN reaction causing severe rash involving mucus membranes or skin necrosis: No Has patient had a PCN reaction that required hospitalization: No Has patient had a PCN reaction occurring within the last 10 years: No If all of the above answers are "NO", then may proceed with Cephalosporin use.   . Codeine Nausea And Vomiting  . Flagyl [Metronidazole] Nausea And Vomiting    Patient was taking this along with Neomycin and could not differentiate which med triggered the nausea & vomiting  .  Meperidine Hcl Nausea And Vomiting  . Neomycin Nausea And Vomiting    Patient was taking this along with Flagyl and could not differentiate which med triggered the nausea & vomiting  . Propoxyphene Hcl Nausea And Vomiting  . Sulfa Antibiotics Hives  . Tramadol Hcl Nausea And Vomiting   Current Outpatient Medications on File Prior to Visit  Medication Sig Dispense Refill  . Ascorbic Acid (VITAMIN C PO) Take by mouth.    Marland Kitchen b complex vitamins tablet Take 1 tablet by mouth daily.    . Cholecalciferol (VITAMIN D) 2000 units tablet Take 2,000 Units by mouth 3 (three) times a week. Takes 5000 units    . ELIQUIS 5 MG TABS tablet TAKE 1 TABLET BY MOUTH TWICE A DAY 120 tablet 1  . Multiple Vitamin (MULTIVITAMIN WITH MINERALS) TABS tablet Take 1 tablet by mouth daily.     . potassium chloride SA (K-DUR,KLOR-CON) 20 MEQ tablet Take 1 tablet (20 mEq total) by mouth daily. 90 tablet 3  . traMADol (ULTRAM) 50 MG tablet Take 1 tablet (50 mg total) by mouth every 6 (six) hours as needed for moderate pain or severe pain. 5 tablet 0   No current facility-administered medications on file prior to visit.    Review of Systems  Constitutional: Negative for activity change, appetite change, fatigue, fever and unexpected weight change.  HENT: Negative for congestion, ear pain, rhinorrhea, sinus pressure and sore throat.        New dentures are working well for her  Eyes: Negative for pain, redness and visual disturbance.  Respiratory: Negative for cough, shortness of breath and wheezing.   Cardiovascular: Negative for chest pain and palpitations.  Gastrointestinal: Negative for abdominal pain, blood in stool, constipation and diarrhea.  Endocrine: Negative for polydipsia and polyuria.  Genitourinary: Negative for dysuria, frequency and urgency.  Musculoskeletal: Positive for arthralgias. Negative for back pain and myalgias.  Skin: Negative for pallor and rash.  Allergic/Immunologic: Negative for environmental  allergies.  Neurological: Negative for dizziness, syncope and headaches.  Hematological: Negative for adenopathy. Does not bruise/bleed easily.  Psychiatric/Behavioral: Negative for decreased concentration and dysphoric mood. The patient is not nervous/anxious.        Objective:   Physical Exam Constitutional:      General: She is not in acute distress.    Appearance: Normal appearance. She is well-developed and normal weight. She is not ill-appearing or diaphoretic.  HENT:     Head: Normocephalic and atraumatic.     Right Ear: Tympanic membrane, ear canal and external ear normal.     Left Ear: Tympanic membrane, ear canal and external ear normal.     Nose: Nose normal. No congestion.     Mouth/Throat:     Mouth: Mucous membranes are moist.     Pharynx: Oropharynx is clear. No posterior oropharyngeal erythema.  Eyes:     General: No scleral icterus.    Extraocular Movements: Extraocular movements intact.     Conjunctiva/sclera: Conjunctivae normal.     Pupils: Pupils are equal, round, and reactive to light.  Neck:     Thyroid: No thyromegaly.     Vascular: No carotid bruit or JVD.  Cardiovascular:     Rate and Rhythm: Regular rhythm. Tachycardia present.     Pulses: Normal pulses.     Heart sounds: Normal heart sounds. No gallop.   Pulmonary:     Effort: Pulmonary effort is normal. No respiratory distress.     Breath sounds: Normal breath sounds. No wheezing.     Comments: Good air exch Chest:     Chest wall: No tenderness.  Abdominal:     General: Bowel sounds are normal. There is no distension or abdominal bruit.     Palpations: Abdomen is soft. There is no mass.     Tenderness: There is no abdominal tenderness.     Hernia: No hernia is present.  Genitourinary:    Comments: Breast exam: No mass, nodules, thickening, tenderness, bulging, retraction, inflamation, nipple discharge or skin changes noted.  No axillary or clavicular LA.     Musculoskeletal:        General:  No tenderness. Normal range of motion.     Cervical back: Normal range of motion and neck supple. No rigidity. No muscular tenderness.     Right lower leg: No edema.     Left lower leg: No edema.     Comments: Mild kyphosis   Lymphadenopathy:     Cervical: No cervical adenopathy.  Skin:    General: Skin is warm and dry.     Coloration: Skin is not pale.     Findings: No erythema or rash.  Neurological:     Mental Status: She is alert. Mental status is at baseline.     Cranial Nerves: No cranial nerve deficit.     Motor: No abnormal muscle tone.     Coordination: Coordination normal.     Gait: Gait normal.     Deep Tendon Reflexes: Reflexes are normal and symmetric. Reflexes normal.  Psychiatric:        Mood and Affect: Mood normal.        Cognition and Memory: Cognition and memory normal.           Assessment & Plan:   Problem List Items Addressed This Visit      Cardiovascular  and Mediastinum   HTN (hypertension)    bp in fair control at this time  BP Readings from Last 1 Encounters:  04/02/20 116/62   No medication needed at this time  Most recent labs reviewed  Disc lifstyle change with low sodium diet and exercise        Relevant Medications   rosuvastatin (CRESTOR) 20 MG tablet   Atherosclerosis of aorta ([facility])    bp and cholesterol remain well controlled  Px crestor today  No symptoms        Relevant Medications   rosuvastatin (CRESTOR) 20 MG tablet     Musculoskeletal and Integument   Osteoporosis    dexa 2/21  No new falls or fratcures Pt declines medication /alendronate over fear of side eff Takes ca and D  Good exercise also  Fall prevention discussed (it is worrisome that she continues to ride horses)  Plan to re check dexa in a year  Handout given re: OP        Hematopoietic and Hemostatic   Blood clotting disorder ([place])    Continues eliquis without problems  Discussed fall prevention in light of riding horses         Other    Vitamin D deficiency    Vitamin D level is therapeutic with current supplementation Disc importance of this to bone and overall health Level of 31.8  Urged compliance with supplements      Prediabetes    Lab Results  Component Value Date   HGBA1C 6.1 03/26/2020   disc imp of low glycemic diet and wt loss to prevent DM2       HLD (hyperlipidemia)    Disc goals for lipids and reasons to control them Rev last labs with pt Rev low sat fat diet in detail Pt wants to change from atorvastatin to rosuvastatin because of muscle pain/side eff Px crestor 20 mg daily and she will update if any problems        Relevant Medications   rosuvastatin (CRESTOR) 20 MG tablet   Routine general medical examination at a health care facility - Primary    Reviewed health habits including diet and exercise and skin cancer prevention Reviewed appropriate screening tests for age  Also reviewed health mt list, fam hx and immunization status , as well as social and family history   See HPI Labs reviewed and amw reviewed Pt plans to make mammogram appt  -info given covid immunized Enc consideration of shingrix vaccine in pharmacy if covered  Colonoscopy is due in august-pt aware  dexa reviewed, no new falls or fx and pt declines tx for now       History of colon cancer    Doing well , continues oncology follow up  Colonoscopy is due in august

## 2020-04-02 NOTE — Assessment & Plan Note (Signed)
Reviewed health habits including diet and exercise and skin cancer prevention Reviewed appropriate screening tests for age  Also reviewed health mt list, fam hx and immunization status , as well as social and family history   See HPI Labs reviewed and amw reviewed Pt plans to make mammogram appt  -info given covid immunized Enc consideration of shingrix vaccine in pharmacy if covered  Colonoscopy is due in august-pt aware  dexa reviewed, no new falls or fx and pt declines tx for now

## 2020-04-02 NOTE — Assessment & Plan Note (Signed)
Continues eliquis without problems  Discussed fall prevention in light of riding horses

## 2020-04-02 NOTE — Assessment & Plan Note (Signed)
Disc goals for lipids and reasons to control them Rev last labs with pt Rev low sat fat diet in detail Pt wants to change from atorvastatin to rosuvastatin because of muscle pain/side eff Px crestor 20 mg daily and she will update if any problems

## 2020-04-02 NOTE — Assessment & Plan Note (Signed)
bp and cholesterol remain well controlled  Px crestor today  No symptoms

## 2020-04-02 NOTE — Patient Instructions (Addendum)
Try to get more vegetables in your diet (especially green ones)   If you are interested in the new shingles vaccine (Shingrix) - call your local pharmacy to check on coverage and availability  If affordable, get on a wait list at your pharmacy to get the vaccine.  Don't forget to schedule your mammogram   You have osteoporosis so be very careful not to fall.  (especially if you choose to ride horses)  Change atorvastatin (lipitor) to rosuvastatin (crestor)  Let me know if this does not help the muscle pain

## 2020-04-02 NOTE — Assessment & Plan Note (Signed)
Lab Results  Component Value Date   HGBA1C 6.1 03/26/2020   disc imp of low glycemic diet and wt loss to prevent DM2

## 2020-04-02 NOTE — Assessment & Plan Note (Signed)
Doing well , continues oncology follow up  Colonoscopy is due in august

## 2020-04-02 NOTE — Assessment & Plan Note (Signed)
Vitamin D level is therapeutic with current supplementation Disc importance of this to bone and overall health Level of 31.8  Urged compliance with supplements

## 2020-05-12 ENCOUNTER — Encounter: Payer: Self-pay | Admitting: Internal Medicine

## 2020-05-12 ENCOUNTER — Ambulatory Visit (INDEPENDENT_AMBULATORY_CARE_PROVIDER_SITE_OTHER): Payer: Medicare HMO | Admitting: Internal Medicine

## 2020-05-12 ENCOUNTER — Other Ambulatory Visit: Payer: Self-pay

## 2020-05-12 VITALS — BP 124/70 | HR 78 | Temp 96.9°F | Wt 158.0 lb

## 2020-05-12 DIAGNOSIS — B351 Tinea unguium: Secondary | ICD-10-CM | POA: Diagnosis not present

## 2020-05-12 DIAGNOSIS — L84 Corns and callosities: Secondary | ICD-10-CM | POA: Diagnosis not present

## 2020-05-12 NOTE — Patient Instructions (Signed)

## 2020-05-12 NOTE — Progress Notes (Signed)
Subjective:    Patient ID: [name], female    DOB: Jan 31, 1941, 80 y.o.   MRN: [id number]  HPI  Pt presents to the clinic today with c/o toenail fungus. She reports this is been a chronic issue for her.  She denies pain, redness or swelling.  She reports the left great toenail has fallen off twice in the past.  She has seen podiatry in the past but not for treatment of toenail fungus.  She has not tried anything over-the-counter for this.  Review of Systems      Past Medical History:  Diagnosis Date  . Anemia   . Arthritis   . Astigmatism    both eyes  . Cancer [facility])    colon surgery done last chemo tx 01-17-17  . Cataract   . Colon cancer ([place])   . Coronary artery disease, non-occlusive   . Dyspnea    more so exertion  . Dysrhythmia    skipped beats every now and then  . GERD (gastroesophageal reflux disease)   . Glaucoma    both eyes right eye worse than left  . Hammer toes of both feet   . Headache    migraines with aura  . Heel spur    both heels and front  . HSV (herpes simplex virus) infection   . Hyperglycemia    denies  . Hyperlipidemia   . IBS (irritable bowel syndrome)   . IC (interstitial cystitis)   . Insomnia disorder related to known organic factor   . Osteoporosis   . PONV (postoperative nausea and vomiting)    has had more surgeries, and had no problems  . Stroke ([place]) 2016  . Vitamin D deficiency     Current Outpatient Medications  Medication Sig Dispense Refill  . Ascorbic Acid (VITAMIN C PO) Take by mouth.    Marland Kitchen b complex vitamins tablet Take 1 tablet by mouth daily.    . Cholecalciferol (VITAMIN D) 2000 units tablet Take 2,000 Units by mouth 3 (three) times a week. Takes 5000 units    . ELIQUIS 5 MG TABS tablet TAKE 1 TABLET BY MOUTH TWICE A DAY 120 tablet 1  . Multiple Vitamin (MULTIVITAMIN WITH MINERALS) TABS tablet Take 1 tablet by mouth daily.     . potassium chloride SA (K-DUR,KLOR-CON) 20 MEQ tablet Take 1 tablet (20 mEq  total) by mouth daily. 90 tablet 3  . rosuvastatin (CRESTOR) 20 MG tablet Take 1 tablet (20 mg total) by mouth daily. 90 tablet 3  . traMADol (ULTRAM) 50 MG tablet Take 1 tablet (50 mg total) by mouth every 6 (six) hours as needed for moderate pain or severe pain. 5 tablet 0   No current facility-administered medications for this visit.    Allergies  Allergen Reactions  . Salmon [Fish Allergy] Anaphylaxis    This happens with "CANNED SALMON" only, if eaten uncooked  . Ampicillin Nausea Only    Has patient had a PCN reaction causing immediate rash, facial/tongue/throat swelling, SOB or lightheadedness with hypotension: No Has patient had a PCN reaction causing severe rash involving mucus membranes or skin necrosis: No Has patient had a PCN reaction that required hospitalization: No Has patient had a PCN reaction occurring within the last 10 years: No If all of the above answers are "NO", then may proceed with Cephalosporin use.   . Codeine Nausea And Vomiting  . Flagyl [Metronidazole] Nausea And Vomiting    Patient was taking this along with Neomycin and  could not differentiate which med triggered the nausea & vomiting  . Meperidine Hcl Nausea And Vomiting  . Neomycin Nausea And Vomiting    Patient was taking this along with Flagyl and could not differentiate which med triggered the nausea & vomiting  . Propoxyphene Hcl Nausea And Vomiting  . Sulfa Antibiotics Hives  . Tramadol Hcl Nausea And Vomiting    Family History  Problem Relation Age of Onset  . Alcohol abuse Mother   . Heart attack Mother   . GER disease Mother   . GER disease Father   . Alcohol abuse Father   . Stroke Father   . Tuberculosis Father   . Heart attack Father   . Aneurysm Father   . Mental illness Sister   . Depression Sister   . Breast cancer Neg Hx     Social History   Socioeconomic History  . Marital status: Married    Spouse name: Not on file  . Number of children: 4  . Years of education:  Not on file  . Highest education level: Not on file  Occupational History  . Occupation: reitred    Employer: RETIRED  Tobacco Use  . Smoking status: Never Smoker  . Smokeless tobacco: Never Used  Vaping Use  . Vaping Use: Never used  Substance and Sexual Activity  . Alcohol use: No    Alcohol/week: 0.0 standard drinks  . Drug use: No  . Sexual activity: Not on file  Other Topics Concern  . Not on file  Social History Narrative   Rides horses         Specialty providers    [facility]   Cardio- [place]   Ortho--[place]   [facility]um-[facility]   urol-[facility]   ENT-- [facility]   Social Determinants of Health   Financial Resource Strain: Low Risk   . Difficulty of Paying Living Expenses: Not hard at all  Food Insecurity: No Food Insecurity  . Worried About Charity fundraiser in the Last Year: Never true  . Ran Out of Food in the Last Year: Never true  Transportation Needs: No Transportation Needs  . Lack of Transportation (Medical): No  . Lack of Transportation (Non-Medical): No  Physical Activity: Inactive  . Days of Exercise per Week: 0 days  . Minutes of Exercise per Session: 0 min  Stress: No Stress Concern Present  . Feeling of Stress : Not at all  Social Connections: Not on file  Intimate Partner Violence: Not At Risk  . Fear of Current or Ex-Partner: No  . Emotionally Abused: No  . Physically Abused: No  . Sexually Abused: No     Constitutional: Denies fever, malaise, fatigue, headache or abrupt weight changes.  Respiratory: Denies difficulty breathing, shortness of breath, cough or sputum production.   Cardiovascular: Denies chest pain, chest tightness, palpitations or swelling in the hands or feet.  Skin: Pt reports toenail fungus. Denies redness, rashes, lesions or ulcercations.    No other specific complaints in a complete review of systems (except as listed in HPI above).  Objective:   Physical Exam   BP 124/70   Pulse 78   Temp (!) 96.9 F  (36.1 C) (Temporal)   Wt 158 lb (71.7 kg)   SpO2 97%   BMI 27.55 kg/m   Wt Readings from Last 3 Encounters:  04/02/20 160 lb 8 oz (72.8 kg)  03/05/20 160 lb 12 oz (72.9 kg)  12/12/19 155 lb 9.6 oz (70.6 kg)  General: Appears her stated age, chronically ill-appearing, in NAD. Skin: Mycotic toenails bilaterally. Cardiovascular: Normal rate. Pulmonary/Chest: Normal effort. Neurological: Alert and oriented.    BMET    Component Value Date/Time   NA 142 03/26/2020 1436   NA 141 02/21/2017 1011   K 3.9 03/26/2020 1436   K 3.5 02/21/2017 1011   CL 107 03/26/2020 1436   CO2 30 03/26/2020 1436   CO2 24 02/21/2017 1011   GLUCOSE 95 03/26/2020 1436   GLUCOSE 134 02/21/2017 1011   BUN 21 03/26/2020 1436   BUN 9.7 02/21/2017 1011   CREATININE 1.09 03/26/2020 1436   CREATININE 0.9 02/21/2017 1011   CALCIUM 9.6 03/26/2020 1436   CALCIUM 9.4 02/21/2017 1011   GFRNONAA 55 (L) 12/12/2019 1246   GFRAA >60 05/03/2019 1242    Lipid Panel     Component Value Date/Time   CHOL 137 03/26/2020 1436   TRIG 75.0 03/26/2020 1436   HDL 72.60 03/26/2020 1436   CHOLHDL 2 03/26/2020 1436   VLDL 15.0 03/26/2020 1436   LDLCALC 50 03/26/2020 1436    CBC    Component Value Date/Time   WBC 7.2 03/26/2020 1436   RBC 4.20 03/26/2020 1436   HGB 12.7 03/26/2020 1436   HGB 13.1 05/03/2019 1242   HGB 11.4 (L) 02/21/2017 1011   HCT 38.4 03/26/2020 1436   HCT 35.5 02/21/2017 1011   PLT 253.0 03/26/2020 1436   PLT 268 05/03/2019 1242   PLT 136 (L) 02/21/2017 1011   MCV 91.4 03/26/2020 1436   MCV 89.0 02/21/2017 1011   MCH 30.1 12/12/2019 1250   MCHC 33.0 03/26/2020 1436   RDW 13.2 03/26/2020 1436   RDW 18.4 (H) 02/21/2017 1011   LYMPHSABS 2.0 03/26/2020 1436   LYMPHSABS 1.2 02/21/2017 1011   MONOABS 0.7 03/26/2020 1436   MONOABS 0.9 02/21/2017 1011   EOSABS 0.1 03/26/2020 1436   EOSABS 0.2 02/21/2017 1011   BASOSABS 0.1 03/26/2020 1436   BASOSABS 0.0 02/21/2017 1011    Hgb  A1C Lab Results  Component Value Date   HGBA1C 6.1 03/26/2020           Assessment & Plan:  Toenail Fungus:  Discussed why we would not want to use oral antifungals given the risk of liver failure Referral to podiatry for further evaluation and treatment  Return precautions discussed  Webb Silversmith, NP This visit occurred during the SARS-CoV-2 public health emergency.  Safety protocols were in place, including screening questions prior to the visit, additional usage of staff PPE, and extensive cleaning of exam room while observing appropriate contact time as indicated for disinfecting solutions.

## 2020-05-13 ENCOUNTER — Encounter: Payer: Self-pay | Admitting: Internal Medicine

## 2020-05-29 ENCOUNTER — Other Ambulatory Visit: Payer: Self-pay | Admitting: Family Medicine

## 2020-06-06 NOTE — Progress Notes (Signed)
[name]   Telephone:([phone] [phone]   Clinic Follow up Note   Patient Care Team: Tower, [name], MD as PCP - General [name], MD as Consulting Physician (Hematology) [name], MD as Attending Physician (Gastroenterology) [name], MD as Consulting Physician (General Surgery)  Date of Service:  06/11/2020  CHIEF COMPLAINT: F/u of colon cancer  SUMMARY OF ONCOLOGIC HISTORY: Oncology History Overview Note  Cancer Staging Cancer of overlapping sites of colon [facility]) Staging form: Colon and Rectum, AJCC 8th Edition - Pathologic stage from 11/10/2016: Stage IIIB (pT3(m), pN1c, cM0) - Signed by [name], MD on 12/16/2016     Cancer of overlapping sites of colon [facility])  08/31/2016 Tumor Marker   Patient's tumor was tested for the following markers: CEA. Results of the tumor marker test revealed 2.0.   08/31/2016 Pathology Results   Colon biopsy Diagnosis: 1. Surgical [P], hepatic flexure. ADENOCARCINOMA. Moderately differentiated. 2. Surgical [P], transverse. ADENOCARCINOMA, Moderately differentiated.   08/31/2016 Initial Diagnosis   Colon cancer metastasized to liver ([place])   08/31/2016 Procedure   - 1) Polypoid mass at the hepatic flexure, 2cm, soft but clearly neoplastic.  - 2) Clearly malignant mass in the distal transverse colon; 4cm, firm, 1/2 cirumference, ulcerated. ' - 3) Four small (3-42mm) scattered typical appearing adenomas throughout the colon, notremoved since she mistakenly stayed on her plavix for this procedure.   09/01/2016 Imaging   CT Abdomen Pelvis W Contrast IMPRESSION: There are 2 colonic lesions involving the proximal ascending colon and proximal transverse colon, as detailed above, highly concerning for colonic neoplasm. In addition, there are at least 2 indeterminate liver lesions which are suspicious for potential metastatic disease.    09/16/2016 Imaging   Mr Liver W Wo Contrast IMPRESSION: Lesion within central  right lobe of liver exhibits of peripheral enhancement and is suspicious for liver metastasis. Lesion within caudate lobe of liver identified on recent CT does not have a corresponding signal or enhancement abnormality on today's study. Within the inferior right lobe of liver there is an enhancing structure which is favored to represent an atypical benign hemangioma with liver metastasis felt less likely. The transverse colon lesion is again identified compatible with colonic adenocarcinoma.   10/04/2016 PET scan   PET 10/04/16 IMPRESSION: 1. Highly hypermetabolic proximal transverse colon mass, maximum SUV 30.8. Small focus of hypermetabolic activity in the mesentery just above this mass probably represents a local involved lymph node. 2. Moderately hypermetabolic ascending colon mass, maximum SUV 8.7. 3. Highly hypermetabolic enlarged portacaval lymph node, maximum SUV 16.7. 4. Focus of hypermetabolic activity near the duodenum bulb could be physiologic or due to an adjacent lymph node. 5. None of the liver lesions are discernibly hypermetabolic. Particularly the more cephalad right hepatic lobe lesion merits surveillance, it had nonspecific enhancement characteristics on prior cross-sectional imaging and only a thin enhancing wall which could conceivably predispose to false negative due to central necrosis. 6. Several tiny chronic pulmonary nodules are likely benign. 7. Other imaging findings of potential clinical significance: Aortic Atherosclerosis (ICD10-I70.0). Coronary atherosclerosis. Bilateral nonobstructive nephrolithiasis.   11/10/2016 Surgery   LAPAROSCOPIC EXTENDED RIGHT COLECTOMY by [name]    11/10/2016 Pathology Results   Diagnosis 11/10/16 Colon, segmental resection for tumor, Ascending and Transverse ADENOCARCINOMA OF THE ASCENDING COLON WITH EXTRA CELLULAR MUCIN, GRADE 3, SIZE 3.4 CM THE TUMOR INVADES MUSCULARIS PROPRIA POORLY DIFFERENTIATED ADENOCARCINOMA OF THE  TRANSVERSE COLON, GRADE 4, SIZE 6.1 CM THE TUMOR INVADES THROUGH MUSCULARIS PROPRIA INTO  PERICOLONIC SOFT TISSUE EXTRAMURAL SATELLITE TUMOR NODULES IS IDENTIFIED (X1) ALL MARGINS OF RESECTION ARE NEGATIVE FOR CARCINOMA METASTATIC CARCINOMA IN ONE OF SIXTY LYMPH NODES (1/60) TABULAR ADENOMA X1   12/28/2016 Imaging   MRI abdomen w/wo contrast IMPRESSION: Motion degraded images.  Two indeterminate right liver lesions measuring up to 1.6 cm, as described above. Continued attention on follow-up is suggested.  Two periportal lymph nodes measuring up to 1.9 cm, suspicious for nodal metastases.  Postsurgical changes related to right colon resection.    12/29/2016 - 04/11/2017 Chemotherapy   FOLFOX every 2 weeks starting 12/29/16 last cycle 7 on 04/11/17.    02/03/2017 Procedure   EUS Per [name] Endoscopic Finding Findings: The examined esophagus was endoscopically normal. The entire examined stomach was endoscopically normal. The examined duodenum was endoscopically normal. Endosonographic Finding (limited evaluation for tissue acquisition): 1. One enlarged lymph node (vs soft tissue mass) was visualized in the porta hepatis region. It measured 16 mm in maximal cross-sectional diameter. The node (vs soft tissue) was oval, isoechoic and had poorly defined margins. Fine needle aspiration for cytology was performed. Color Doppler imaging was utilized prior to needle puncture to confirm a lack of significant vascular structures within the needle path. Threepasses were made with the 25 gauge needle using a transgastric approach. 2. CBD was normal, non-dilated 3. Limited views of the liver, spleen, pancreas were all normal.  Impression: One periportal soft tissue mass (lymphnode?) measuring 36mm was noted and sampled with FNA.   02/03/2017 Pathology Results   Diagnosis FINE NEEDLE ASPIRATION, ENDOSCOPIC, PERI PORTAL NODE (SPECIMEN 1 OF 1 COLLECTED 02/03/17): NO MALIGNANT CELLS  IDENTIFIED. SCANT LYMPHOID AND BENIGN GLANDULAR ELEMENTS.   03/09/2017 Imaging   IMPRESSION: Interval decrease in several tiny low-attenuation liver lesions the, consistent with improving hepatic metastases.  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  TomorNext Lynch+ cancer Next was ordered throught he laboratory [facility].  TumorNext Lynch with CancerNext: Paired Germline and Tumor Analyses for EnhancedDiagnosis of Lynch Syndrome plus Analyses of 29 Additional Genes Associated with Hereditary Cancer.  Germline Genes analyzed: MLH1, MSH2, MSH6, PMS2, APC, ATM, BARD1, BMPR1A, BRIP1, CDH1, CDKN2A, CHEK2, DICER1, MRE11A, MUTYH, NBN, PALB2, PTEN, RAD50, RAD51C, RAD51D, SMAD4, STK11, TP53, CDK4, NF1, BRCA1, BRCA2, POLD1, POLE, SMARCA4, HOXB13 (sequencing and deletion/duplication); EPCAM, GREM1 (deletion/duplication only).  Results: Germline- Negative, no variants detected.  Tumor/Somatic-  BRAF V600E detected, No variants in KRAS or NRAS detected.  MLH1 promoter hypermethylation present, Microsatellite High.   Somatic pathogenic mutations in MSH2 c.687delA and pathogenic mutation in MSH6 c.3261dupC.  2 Somatic Variants of uncertain significance in MSH2 called p.A123T and p.D654N.   The date of this test report is 04/07/2017.     04/26/2017 - 06/12/2017 Chemotherapy   Xeloda $Remov'2000mg'UACloO$  in the AM and $Remo'1500mg'NFphK$  in the PM started on 04/26/17. Changed to $RemoveBe'1500mg'qAyDfQAUy$  BID on 05/16/17   06/13/2017 Imaging    CT CAP W Contrast 06/13/17 IMPRESSION: 1. Continued regression of hepatic metastatic disease. No new or progressive findings. 2. Surgical changes from a right hemicolectomy. No findings for recurrent cancer. No mesenteric or retroperitoneal mass or Adenopathy.   09/19/2017 Imaging   CT AP W  Contrast 09/19/17  IMPRESSION: 1. The inferior right lobe of liver lesion appears slightly increased in size when  compared with the previous exam. 2. No new liver lesions identified. 3. Stable appearance of the colon status post right hemicolectomy. 4.  Aortic Atherosclerosis (ICD10-I70.0).    10/01/2017 Imaging   MRI Abdomen W WO Contrast IMPRESSION: 1. Two small, subtle liver lesions are again identified involving segment 7 and segment 6. When compared with the MRI from 12/28/2016 these are not significantly changed. Compared with the MRI from 09/15/2016 the peripherally enhancing lesion within segment 7 has decreased in size in the interval. There has been no significant change in the segment 6 lesion. 2. No new foci of suspected metastasis identified.   03/29/2018 Imaging   MRI Abdomen  IMPRESSION: 1. The 2 small subtle liver lesions involving segment 7 and segment 6 are again noted. When compared with 10/01/2017 these are stable to minimally decreased in size. No new liver lesion identified. 2.  Aortic Atherosclerosis (ICD10-I70.0).    03/30/2018 Imaging   CT chest  IMPRESSION: 1. Solitary new solid pulmonary nodule in the anterior right middle lobe measuring 4 mm. Recommend attention on follow-up chest CT in 3 months. 2. No additional potential findings of metastatic disease in he chest. 3. Nonobstructing left nephrolithiasis. 4. One vessel coronary atherosclerosis.  Aortic Atherosclerosis (ICD10-I70.0).    05/03/2019 Imaging   CT CAP W Contrast  IMPRESSION: 1. Stable exam. No new or progressive findings to suggest recurrent or metastatic disease in the chest, abdomen, or pelvis. 2. Stable tiny bilateral pulmonary nodules, likely benign. 3. 18 mm inferior right liver lesion seen on prior studies is more subtle on today's exam and measures smaller. A second tiny lesion described on prior studies towards the dome of the liver is inapparent on today's exam 4.  Stable small low-density lesions in the right kidney, likely cysts. Continued attention on follow-up recommended. 5. Aortic Atherosclerosis (ICD10-I70.0).   06/10/2020 Imaging   CT AP  IMPRESSION: 1. Stable surgical changes from a right hemicolectomy. No findings for recurrent tumor or adenopathy. 2. Overall stable low-attenuation lesion in segment 6 of the liver. No new or progressive findings in the liver. 3. Status post cholecystectomy. No biliary dilatation. 4. Stable lower pole left renal calculus. 5. Aortic atherosclerosis.   Aortic Atherosclerosis (ICD10-I70.0).        CURRENT THERAPY:  Surveillance   INTERVAL HISTORY:  [name] is here for a follow up of colon cancer. She was last seen by me in 04/2019 and seen by NP Lacie 6 months ago in interim. She presents to the clinic alone. She notes she feels fine. She notes she was recently seen by her PCP where her Oxygen was 94%. She notes this is down and feels SOB. She attributes this to history of long term second-hand smoke. She denies stomach bloating or nausea. She notes a fungal infection of her left toenail recently. 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 She notes she had increased urination after scan yesterday. She notes no more pain from her prior [facility] location.    REVIEW OF SYSTEMS:   Constitutional: Denies fevers, chills or abnormal weight loss Eyes: Denies blurriness of vision Ears, nose, mouth, throat, and face: Denies mucositis or sore throat Respiratory: Denies cough, dyspnea or wheezes Cardiovascular: Denies palpitation, chest discomfort or lower extremity swelling Gastrointestinal:  Denies nausea, heartburn or change in bowel habits Skin: Denies abnormal skin rashes (+) Left toe nail fungal infection Lymphatics: Denies new lymphadenopathy or easy bruising Neurological:Denies numbness, tingling or new weaknesses Behavioral/Psych: Mood  is stable, no new changes  All  other systems were reviewed with the patient and are negative.  MEDICAL HISTORY:  Past Medical History:  Diagnosis Date  . Anemia   . Arthritis   . Astigmatism    both eyes  . Cancer [facility])    colon surgery done last chemo tx 01-17-17  . Cataract   . Colon cancer ([place])   . Coronary artery disease, non-occlusive   . Dyspnea    more so exertion  . Dysrhythmia    skipped beats every now and then  . GERD (gastroesophageal reflux disease)   . Glaucoma    both eyes right eye worse than left  . Hammer toes of both feet   . Headache    migraines with aura  . Heel spur    both heels and front  . HSV (herpes simplex virus) infection   . Hyperglycemia    denies  . Hyperlipidemia   . Hypertension   . IBS (irritable bowel syndrome)   . IC (interstitial cystitis)   . Insomnia disorder related to known organic factor   . Osteoporosis   . PONV (postoperative nausea and vomiting)    has had more surgeries, and had no problems  . Stroke ([place]) 2016  . Vitamin D deficiency     SURGICAL HISTORY: Past Surgical History:  Procedure Laterality Date  . ABDOMINAL HYSTERECTOMY     1 ovary and womb removed  . CARDIAC CATHETERIZATION  7/12   non obst dz -- not sure what yr  . CHOLECYSTECTOMY    . EUS N/A 02/03/2017   Procedure: UPPER ENDOSCOPIC ULTRASOUND (EUS) LINEAR;  Surgeon: [name], MD;  Location: [facility];  Service: Endoscopy;  Laterality: N/A;  . EYE SURGERY Bilateral    ioc for cataracts   . LAPAROSCOPIC RIGHT COLECTOMY Right 11/10/2016   Procedure: LAPAROSCOPIC EXTENDED RIGHT COLECTOMY;  Surgeon: [name], MD;  Location: [place];  Service: General;  Laterality: Right;  . PORT-A-CATH REMOVAL Left 11/21/2019   Procedure: REMOVAL PORT-A-CATH;  Surgeon: [name], MD;  Location: [place];  Service: General;  Laterality: Left;  . PORTACATH PLACEMENT Left 12/22/2016   Procedure: INSERTION PORT-A-CATH;  Surgeon: [name], MD;  Location: [place];  Service: General;  Laterality: Left;  . ROTATOR CUFF REPAIR Bilateral     I have reviewed the social history and family history with the patient and they are unchanged from previous note.  ALLERGIES:  is allergic to salmon [fish allergy], ampicillin, codeine, flagyl [metronidazole], meperidine hcl, neomycin, propoxyphene hcl, sulfa antibiotics, and tramadol hcl.  MEDICATIONS:  Current Outpatient Medications  Medication Sig Dispense Refill  . Ascorbic Acid (VITAMIN C PO) Take by mouth.    Marland Kitchen b complex vitamins tablet Take 1 tablet by mouth daily.    . Cholecalciferol (VITAMIN D) 2000 units tablet Take 2,000 Units by mouth 3 (three) times a week. Takes 5000 units    . ELIQUIS 5 MG TABS tablet TAKE 1 TABLET BY MOUTH TWICE A DAY 120 tablet 1  . Multiple Vitamin (MULTIVITAMIN WITH MINERALS) TABS tablet Take 1 tablet by mouth daily.     . Multiple Vitamins-Minerals (HAIR SKIN AND NAILS FORMULA PO) Take by mouth.    . potassium chloride SA (K-DUR,KLOR-CON) 20 MEQ tablet Take 1 tablet (20 mEq total) by mouth daily. 90 tablet 3  . rosuvastatin (CRESTOR) 20 MG tablet Take 1 tablet (20 mg total) by mouth daily. 90 tablet 3  .  traMADol (ULTRAM) 50 MG tablet Take 1 tablet (50 mg total) by mouth every 6 (six) hours as needed for moderate pain or severe pain. 5 tablet 0  . Zinc Sulfate (ZINC 15 PO) Take by mouth.     No current facility-administered medications for this visit.    PHYSICAL EXAMINATION: ECOG PERFORMANCE STATUS: 0 - Asymptomatic  Vitals:   06/11/20 1316  BP: (!) 148/56  Pulse: (!) 57  Resp: 18  Temp: 97.9 F (36.6 C)  SpO2: 99%   Filed Weights   06/11/20 1316  Weight: 159 lb 6.4 oz (72.3 kg)    GENERAL:alert, no distress and comfortable SKIN: skin color, texture, turgor are normal, no rashes or significant lesions EYES: normal, Conjunctiva are pink and non-injected, sclera clear  NECK: supple, thyroid normal size, non-tender, without nodularity LYMPH:  no  palpable lymphadenopathy in the cervical, axillary  LUNGS: clear to auscultation and percussion with normal breathing effort HEART: regular rate & rhythm and no murmurs and no lower extremity edema ABDOMEN:abdomen soft, non-tender and normal bowel sounds Musculoskeletal:no cyanosis of digits and no clubbing  NEURO: alert & oriented x 3 with fluent speech, no focal motor/sensory deficits  LABORATORY DATA:  I have reviewed the data as listed CBC Latest Ref Rng & Units 06/11/2020 03/26/2020 12/12/2019  WBC 4.0 - 10.5 K/uL 6.5 7.2 6.7  Hemoglobin 12.0 - 15.0 g/dL 12.2 12.7 12.3  Hematocrit 36.0 - 46.0 % 37.8 38.4 38.5  Platelets 150 - 400 K/uL 242 253.0 249     CMP Latest Ref Rng & Units 06/11/2020 06/10/2020 03/26/2020  Glucose 70 - 99 mg/dL 98 - 95  BUN 8 - 23 mg/dL 14 - 21  Creatinine 0.44 - 1.00 mg/dL 1.19(H) 0.90 1.09  Sodium 135 - 145 mmol/L 144 - 142  Potassium 3.5 - 5.1 mmol/L 4.5 - 3.9  Chloride 98 - 111 mmol/L 106 - 107  CO2 22 - 32 mmol/L 29 - 30  Calcium 8.9 - 10.3 mg/dL 9.3 - 9.6  Total Protein 6.5 - 8.1 g/dL 6.5 - 6.6  Total Bilirubin 0.3 - 1.2 mg/dL 0.6 - 0.6  Alkaline Phos 38 - 126 U/L 74 - 71  AST 15 - 41 U/L 21 - 16  ALT 0 - 44 U/L 20 - 15      RADIOGRAPHIC STUDIES: I have personally reviewed the radiological images as listed and agreed with the findings in the report. CT Abdomen Pelvis W Contrast  Result Date: 06/11/2020 CLINICAL DATA:  History of colorectal cancer.  Restaging. EXAM: CT ABDOMEN AND PELVIS WITH CONTRAST TECHNIQUE: Multidetector CT imaging of the abdomen and pelvis was performed using the standard protocol following bolus administration of intravenous contrast. CONTRAST:  142mL OMNIPAQUE IOHEXOL 300 MG/ML  SOLN COMPARISON:  CT scan 05/03/2019 FINDINGS: Lower chest: The lung bases are clear of an acute process. No worrisome pulmonary lesions. The heart is normal in size. No pericardial effusion. The esophagus is grossly normal. Hepatobiliary: There is a  small low-attenuation lesion in segment 6 of the liver. This measures approximately 13 mm. It measured approximately 11 mm on the prior CT scan from 05/03/2019. It measured 18 mm on the prior CT scan from 2019. No other hepatic lesions are identified. The portal and hepatic veins are patent. The gallbladder is surgically absent. No common bile duct dilatation. Pancreas: Mild stable pancreatic atrophy but no mass, inflammation or ductal dilatation. Spleen: Normal size.  No focal lesions. Adrenals/Urinary Tract: Adrenal glands and kidneys unremarkable. Stable lower pole left  renal calculus and small scattered renal cysts. The bladder is unremarkable. Stomach/Bowel: The stomach, duodenum, small bowel and colon are unremarkable. No acute inflammatory changes, mass lesions or obstructive findings. Stable surgical changes from a right hemicolectomy. Vascular/Lymphatic: Stable aortic and iliac artery calcifications but no aneurysm or dissection. The major venous structures are patent. No mesenteric or retroperitoneal mass or adenopathy. Reproductive: Surgically absent. Other: No pelvic mass or adenopathy. No free pelvic fluid collections. No inguinal mass or adenopathy. No abdominal wall hernia or subcutaneous lesions. Musculoskeletal: No significant bony findings. IMPRESSION: 1. Stable surgical changes from a right hemicolectomy. No findings for recurrent tumor or adenopathy. 2. Overall stable low-attenuation lesion in segment 6 of the liver. No new or progressive findings in the liver. 3. Status post cholecystectomy. No biliary dilatation. 4. Stable lower pole left renal calculus. 5. Aortic atherosclerosis. Aortic Atherosclerosis (ICD10-I70.0). Electronically Signed   By: Marijo Sanes M.D.   On: 06/11/2020 10:05     ASSESSMENT & PLAN:  [name] is a 80 y.o. female with    1. Synchronized colon adenocarcinoma, moderately differentiated, in hepaticflexureand distal transverse colon, pT3(m)N1cMx with  indeterminate liver lesions, at least stage IIIB, MSI-H -She was diagnosed in 08/31/16. She is s/p rightcolectomy,adjuvantFOLFOX which was changed toXelodaafter cycle 6 due to poor tolerance.  -Initial staging scan showed 2 liver lesions, suspicious for liver metastasis, but not hypermetabolic on PET. The liver lesion has decreased in size after adjuvant chemotherapy. -She is currently on surveillance. Her CT CAP from 05/03/19 showed stable exam with no new or progressive findings to suggest recurrent or metastatic disease.  -I personally reviewed her CT AP from 06/10/20 which showed No findings for recurrent tumor or adenopathy and Overall stable low-attenuation lesion in segment 6 of the liver. No new or progressive findings in the liver. I personally reviewed scan results with patient today.  -She is clinically doing well and stable. Labs reviewed, CBC and CMP WNL except mildly elevated Cr. Her CEA is still pending. Her physical exam unremarkable.  -She is almost 4 years from her cancer recurrence. Her risk of recurrence has significantly decreased. Will continue surveillance. She plans to have colonoscopy in 09/2020. I do not plan to scan her again, unless she has concerning symptoms.  -F/u in 1 year for likely last visit   2. Genetics was negative for pathogenetic mutations.  3. Liver lesions  -Previousbiopsy of theenlarged portal node was negative for metastasis, but also appearto shrink onchemotherapybased on repeat scans. -Dr. Bethel Born feels her liver lesions are difficult to biopsy, and overall stable. -HerlastCT chest from 06/26/18 shows unremarkable upper abdomen. -05/03/19 CT CAP show liver lesions smaller, the smaller lesion is not seen on scan. stable on 06/10/20 CT CAP.   4. 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  -Continue to f/u with PCP    Plan: -CT CAP reviewed, NED -Colonoscopy in 09/2020 with Dr [name]  -Lab and F/u in 1 year.    No problem-specific Assessment & Plan notes found for this encounter.   No orders of the defined types were placed in this encounter.  All questions were answered. The patient knows to call the clinic with any problems, questions or concerns. No barriers to learning was detected. The total time spent in the appointment was 30 minutes.     [name], MD 06/11/2020   I, Joslyn Devon, am acting as scribe for [name],  MD.   I have reviewed the above documentation for accuracy and completeness, and I agree with the above.

## 2020-06-10 ENCOUNTER — Ambulatory Visit (HOSPITAL_COMMUNITY)
Admission: RE | Admit: 2020-06-10 | Discharge: 2020-06-10 | Disposition: A | Discharge status: Home / Self Care | Payer: Medicare HMO | Source: Ambulatory Visit | Attending: Nurse Practitioner | Admitting: Nurse Practitioner

## 2020-06-10 ENCOUNTER — Other Ambulatory Visit: Payer: Self-pay

## 2020-06-10 ENCOUNTER — Encounter (HOSPITAL_COMMUNITY): Payer: Self-pay

## 2020-06-10 DIAGNOSIS — N281 Cyst of kidney, acquired: Secondary | ICD-10-CM | POA: Diagnosis not present

## 2020-06-10 DIAGNOSIS — C188 Malignant neoplasm of overlapping sites of colon: Secondary | ICD-10-CM | POA: Diagnosis not present

## 2020-06-10 DIAGNOSIS — K8689 Other specified diseases of pancreas: Secondary | ICD-10-CM | POA: Diagnosis not present

## 2020-06-10 DIAGNOSIS — N200 Calculus of kidney: Secondary | ICD-10-CM | POA: Diagnosis not present

## 2020-06-10 DIAGNOSIS — K7689 Other specified diseases of liver: Secondary | ICD-10-CM | POA: Diagnosis not present

## 2020-06-10 LAB — POCT I-STAT CREATININE: Creatinine, Ser: 0.9 mg/dL (ref 0.44–1.00)

## 2020-06-10 MED ORDER — IOHEXOL 9 MG/ML PO SOLN
ORAL | Status: AC
  Filled 2020-06-10: qty 1000

## 2020-06-10 MED ORDER — IOHEXOL 300 MG/ML  SOLN
100.0000 mL | Freq: Once | INTRAMUSCULAR | Status: AC | PRN
  Administered 2020-06-10: 100 mL via INTRAVENOUS

## 2020-06-10 MED ORDER — IOHEXOL 9 MG/ML PO SOLN
1000.0000 mL | ORAL | Status: AC
  Administered 2020-06-10: 1000 mL via ORAL

## 2020-06-11 ENCOUNTER — Encounter: Payer: Self-pay | Admitting: Hematology

## 2020-06-11 ENCOUNTER — Inpatient Hospital Stay: Payer: Medicare HMO | Attending: Hematology

## 2020-06-11 ENCOUNTER — Other Ambulatory Visit: Payer: Self-pay

## 2020-06-11 ENCOUNTER — Telehealth: Payer: Self-pay | Admitting: Hematology

## 2020-06-11 ENCOUNTER — Inpatient Hospital Stay: Payer: Medicare HMO | Admitting: Hematology

## 2020-06-11 VITALS — BP 148/56 | HR 57 | Temp 97.9°F | Resp 18 | Ht 63.0 in | Wt 159.4 lb

## 2020-06-11 DIAGNOSIS — Z7901 Long term (current) use of anticoagulants: Secondary | ICD-10-CM | POA: Insufficient documentation

## 2020-06-11 DIAGNOSIS — I10 Essential (primary) hypertension: Secondary | ICD-10-CM

## 2020-06-11 DIAGNOSIS — I4891 Unspecified atrial fibrillation: Secondary | ICD-10-CM | POA: Diagnosis not present

## 2020-06-11 DIAGNOSIS — Z85038 Personal history of other malignant neoplasm of large intestine: Secondary | ICD-10-CM | POA: Diagnosis not present

## 2020-06-11 DIAGNOSIS — R0602 Shortness of breath: Secondary | ICD-10-CM | POA: Diagnosis not present

## 2020-06-11 DIAGNOSIS — C787 Secondary malignant neoplasm of liver and intrahepatic bile duct: Secondary | ICD-10-CM

## 2020-06-11 DIAGNOSIS — Z8673 Personal history of transient ischemic attack (TIA), and cerebral infarction without residual deficits: Secondary | ICD-10-CM | POA: Diagnosis not present

## 2020-06-11 DIAGNOSIS — C189 Malignant neoplasm of colon, unspecified: Secondary | ICD-10-CM

## 2020-06-11 DIAGNOSIS — C188 Malignant neoplasm of overlapping sites of colon: Secondary | ICD-10-CM

## 2020-06-11 DIAGNOSIS — D500 Iron deficiency anemia secondary to blood loss (chronic): Secondary | ICD-10-CM

## 2020-06-11 DIAGNOSIS — Z9049 Acquired absence of other specified parts of digestive tract: Secondary | ICD-10-CM | POA: Insufficient documentation

## 2020-06-11 LAB — IRON AND TIBC
Iron: 102 ug/dL (ref 41–142)
Saturation Ratios: 31 % (ref 21–57)
TIBC: 328 ug/dL (ref 236–444)
UIBC: 225 ug/dL (ref 120–384)

## 2020-06-11 LAB — CBC WITH DIFFERENTIAL/PLATELET
Abs Immature Granulocytes: 0.01 10*3/uL (ref 0.00–0.07)
Basophils Absolute: 0 10*3/uL (ref 0.0–0.1)
Basophils Relative: 1 %
Eosinophils Absolute: 0.1 10*3/uL (ref 0.0–0.5)
Eosinophils Relative: 1 %
HCT: 37.8 % (ref 36.0–46.0)
Hemoglobin: 12.2 g/dL (ref 12.0–15.0)
Immature Granulocytes: 0 %
Lymphocytes Relative: 24 %
Lymphs Abs: 1.6 10*3/uL (ref 0.7–4.0)
MCH: 29.8 pg (ref 26.0–34.0)
MCHC: 32.3 g/dL (ref 30.0–36.0)
MCV: 92.2 fL (ref 80.0–100.0)
Monocytes Absolute: 0.5 10*3/uL (ref 0.1–1.0)
Monocytes Relative: 8 %
Neutro Abs: 4.3 10*3/uL (ref 1.7–7.7)
Neutrophils Relative %: 66 %
Platelets: 242 10*3/uL (ref 150–400)
RBC: 4.1 MIL/uL (ref 3.87–5.11)
RDW: 13.1 % (ref 11.5–15.5)
WBC: 6.5 10*3/uL (ref 4.0–10.5)
nRBC: 0 % (ref 0.0–0.2)

## 2020-06-11 LAB — RETICULOCYTES
Immature Retic Fract: 9.1 % (ref 2.3–15.9)
RBC.: 4.1 MIL/uL (ref 3.87–5.11)
Retic Count, Absolute: 59 10*3/uL (ref 19.0–186.0)
Retic Ct Pct: 1.4 % (ref 0.4–3.1)

## 2020-06-11 LAB — COMPREHENSIVE METABOLIC PANEL
ALT: 20 U/L (ref 0–44)
AST: 21 U/L (ref 15–41)
Albumin: 3.8 g/dL (ref 3.5–5.0)
Alkaline Phosphatase: 74 U/L (ref 38–126)
Anion gap: 9 (ref 5–15)
BUN: 14 mg/dL (ref 8–23)
CO2: 29 mmol/L (ref 22–32)
Calcium: 9.3 mg/dL (ref 8.9–10.3)
Chloride: 106 mmol/L (ref 98–111)
Creatinine, Ser: 1.19 mg/dL — ABNORMAL HIGH (ref 0.44–1.00)
GFR, Estimated: 46 mL/min — ABNORMAL LOW (ref >60)
Glucose, Bld: 98 mg/dL (ref 70–99)
Potassium: 4.5 mmol/L (ref 3.5–5.1)
Sodium: 144 mmol/L (ref 135–145)
Total Bilirubin: 0.6 mg/dL (ref 0.3–1.2)
Total Protein: 6.5 g/dL (ref 6.5–8.1)

## 2020-06-11 LAB — FERRITIN: Ferritin: 34 ng/mL (ref 11–307)

## 2020-06-11 LAB — CEA (IN HOUSE-CHCC): CEA (CHCC-In House): 1.27 ng/mL (ref 0.00–5.00)

## 2020-06-11 NOTE — Telephone Encounter (Signed)
Scheduled follow-up appointment per 4/20 los. Patient is aware. 

## 2020-06-21 ENCOUNTER — Other Ambulatory Visit: Payer: Self-pay | Admitting: Nurse Practitioner

## 2020-08-01 DIAGNOSIS — Z23 Encounter for immunization: Secondary | ICD-10-CM | POA: Diagnosis not present

## 2020-08-22 ENCOUNTER — Encounter: Payer: Self-pay | Admitting: Hematology

## 2020-10-18 ENCOUNTER — Encounter: Payer: Self-pay | Admitting: Hematology

## 2020-10-19 ENCOUNTER — Other Ambulatory Visit: Payer: Self-pay | Admitting: Nurse Practitioner

## 2020-10-21 ENCOUNTER — Encounter: Payer: Self-pay | Admitting: Hematology

## 2020-12-01 ENCOUNTER — Encounter: Payer: Self-pay | Admitting: Gastroenterology

## 2020-12-25 ENCOUNTER — Encounter (HOSPITAL_COMMUNITY): Payer: Self-pay | Admitting: Emergency Medicine

## 2020-12-25 ENCOUNTER — Ambulatory Visit (HOSPITAL_COMMUNITY)
Admission: EM | Admit: 2020-12-25 | Discharge: 2020-12-25 | Disposition: A | Discharge status: Home / Self Care | Payer: Medicare HMO | Attending: Internal Medicine | Admitting: Internal Medicine

## 2020-12-25 ENCOUNTER — Other Ambulatory Visit: Payer: Self-pay

## 2020-12-25 ENCOUNTER — Ambulatory Visit (INDEPENDENT_AMBULATORY_CARE_PROVIDER_SITE_OTHER): Payer: Medicare HMO

## 2020-12-25 DIAGNOSIS — R0602 Shortness of breath: Secondary | ICD-10-CM | POA: Diagnosis not present

## 2020-12-25 DIAGNOSIS — R058 Other specified cough: Secondary | ICD-10-CM | POA: Insufficient documentation

## 2020-12-25 DIAGNOSIS — R059 Cough, unspecified: Secondary | ICD-10-CM

## 2020-12-25 DIAGNOSIS — R519 Headache, unspecified: Secondary | ICD-10-CM | POA: Diagnosis not present

## 2020-12-25 LAB — CBC
HCT: 38.5 % (ref 36.0–46.0)
Hemoglobin: 12.6 g/dL (ref 12.0–15.0)
MCH: 30 pg (ref 26.0–34.0)
MCHC: 32.7 g/dL (ref 30.0–36.0)
MCV: 91.7 fL (ref 80.0–100.0)
Platelets: 249 10*3/uL (ref 150–400)
RBC: 4.2 MIL/uL (ref 3.87–5.11)
RDW: 12.5 % (ref 11.5–15.5)
WBC: 7.4 10*3/uL (ref 4.0–10.5)
nRBC: 0 % (ref 0.0–0.2)

## 2020-12-25 LAB — BASIC METABOLIC PANEL
Anion gap: 9 (ref 5–15)
BUN: 11 mg/dL (ref 8–23)
CO2: 27 mmol/L (ref 22–32)
Calcium: 9.5 mg/dL (ref 8.9–10.3)
Chloride: 105 mmol/L (ref 98–111)
Creatinine, Ser: 0.87 mg/dL (ref 0.44–1.00)
GFR, Estimated: >60 mL/min (ref >60)
Glucose, Bld: 105 mg/dL — ABNORMAL HIGH (ref 70–99)
Potassium: 3.1 mmol/L — ABNORMAL LOW (ref 3.5–5.1)
Sodium: 141 mmol/L (ref 135–145)

## 2020-12-25 MED ORDER — ALBUTEROL SULFATE HFA 108 (90 BASE) MCG/ACT IN AERS: 1.0000 | INHALATION_SPRAY | Freq: Four times a day (QID) | RESPIRATORY_TRACT | 0 refills | Status: DC | PRN

## 2020-12-25 MED ORDER — PREDNISONE 20 MG PO TABS: 20.0000 mg | ORAL_TABLET | Freq: Every day | ORAL | 0 refills | Status: AC

## 2020-12-25 MED ORDER — BENZONATATE 100 MG PO CAPS: 100.0000 mg | ORAL_CAPSULE | Freq: Three times a day (TID) | ORAL | 0 refills | Status: DC

## 2020-12-25 NOTE — ED Provider Notes (Signed)
[facility]    CSN: [id number] Arrival date & time: 12/25/20  4503      History   Chief Complaint Chief Complaint  Patient presents with   Cough    HPI [name] is a 80 y.o. female comes to urgent care with a 6-week history of cough, chest congestion and sputum production.  Patient is having some remodeling in her house.  As a result of that has been a lot of dust and possibly mold exposure.  She also had contact with one of the contractors who was sick with what seems like a upper respiratory infection.  Patient had similar symptoms 6 weeks ago.  Symptoms have evolved and she currently has some headaches, cough productive of greenish sputum and congestion.  She denies any shortness of breath or wheezing.  She describes chest tightness.  No fever or chills.  No night sweats or weight loss. HPI  Past Medical History:  Diagnosis Date   Anemia    Arthritis    Astigmatism    both eyes   Cancer ([place])    colon surgery done last chemo tx 01-17-17   Cataract    Colon cancer [facility])    Coronary artery disease, non-occlusive    Dyspnea    more so exertion   Dysrhythmia    skipped beats every now and then   GERD (gastroesophageal reflux disease)    Glaucoma    both eyes right eye worse than left   Hammer toes of both feet    Headache    migraines with aura   Heel spur    both heels and front   HSV (herpes simplex virus) infection    Hyperglycemia    denies   Hyperlipidemia    Hypertension    IBS (irritable bowel syndrome)    IC (interstitial cystitis)    Insomnia disorder related to known organic factor    Osteoporosis    PONV (postoperative nausea and vomiting)    has had more surgeries, and had no problems   Stroke [facility]) 2016   Vitamin D deficiency     Patient Active Problem List   Diagnosis Date Noted   History of colon cancer 04/02/2020   Atherosclerosis of aorta ([place]) 04/02/2020   Right shoulder pain 11/15/2019   Left ear pain 11/15/2019    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   Fall 10/28/2017   Abnormality of breast on screening mammography 08/01/2017   Genetic testing 04/25/2017   Adenopathy    History of DVT (deep vein thrombosis) 11/24/2016   Cancer of overlapping sites of colon ([place]) 09/23/2016   Estrogen deficiency 07/30/2016   Screening mammogram, encounter for 07/30/2016   Routine general medical examination at a health care facility 07/27/2016   H/O: CVA (cerebrovascular accident) 05/06/2015   HTN (hypertension) 05/06/2015   HLD (hyperlipidemia) 05/06/2015   GERD (gastroesophageal reflux disease) 05/06/2015   CAD (coronary artery disease) 05/06/2015   Seborrheic keratosis 07/05/2012   Herpes simplex 12/29/2010   Callus of foot 10/16/2010   Coronary artery disease, non-occlusive 10/16/2010   Insomnia 02/02/2010   HAND PAIN, BILATERAL 01/09/2010   Prediabetes 07/29/2009   ALLERGIC RHINITIS 12/04/2008   Vitamin D deficiency 04/30/2008   COLONIC POLYPS 06/28/2007   IBS 03/17/2007   INTERSTITIAL CYSTITIS 03/17/2007   Osteoporosis 03/17/2007    Past Surgical History:  Procedure Laterality Date   ABDOMINAL HYSTERECTOMY     1 ovary and womb removed   CARDIAC  CATHETERIZATION  7/12   non obst dz -- not sure what yr   CHOLECYSTECTOMY     EUS N/A 02/03/2017   Procedure: UPPER ENDOSCOPIC ULTRASOUND (EUS) LINEAR;  Surgeon: [name], MD;  [facility];  Service: Endoscopy;  Laterality: N/A;   EYE SURGERY Bilateral    ioc for cataracts    LAPAROSCOPIC RIGHT COLECTOMY Right 11/10/2016   Procedure: LAPAROSCOPIC EXTENDED RIGHT COLECTOMY;  Surgeon: [name], MD;  Location: [place];  Service: General;  Laterality: Right;   PORT-A-CATH REMOVAL Left 11/21/2019   Procedure: REMOVAL PORT-A-CATH;  Surgeon: [name], MD;  Location: [place];  Service: General;  Laterality: Left;   PORTACATH PLACEMENT Left 12/22/2016   Procedure: INSERTION  PORT-A-CATH;  Surgeon: [name], MD;  Location: [place];  Service: General;  Laterality: Left;   ROTATOR CUFF REPAIR Bilateral     OB History   No obstetric history on file.      Home Medications    Prior to Admission medications   Medication Sig Start Date End Date Taking? Authorizing Provider  albuterol (VENTOLIN HFA) 108 (90 Base) MCG/ACT inhaler Inhale 1 puff into the lungs every 6 (six) hours as needed for wheezing or shortness of breath. 12/25/20  Yes Elliyah Liszewski, Myrene Galas, MD  benzonatate (TESSALON) 100 MG capsule Take 1 capsule (100 mg total) by mouth every 8 (eight) hours. 12/25/20  Yes Elmo Rio, Myrene Galas, MD  ELIQUIS 5 MG TABS tablet TAKE 1 TABLET BY MOUTH TWICE A DAY 10/21/20  Yes [name], MD  Multiple Vitamin (MULTIVITAMIN WITH MINERALS) TABS tablet Take 1 tablet by mouth daily.    Yes [provider]  Multiple Vitamins-Minerals (HAIR SKIN AND NAILS FORMULA PO) Take by mouth.   Yes [provider]  predniSONE (DELTASONE) 20 MG tablet Take 1 tablet (20 mg total) by mouth daily for 5 days. 12/25/20 12/30/20 Yes Carleta Woodrow, Myrene Galas, MD  rosuvastatin (CRESTOR) 20 MG tablet Take 1 tablet (20 mg total) by mouth daily. 04/02/20  Yes Tower, [name], MD  Ascorbic Acid (VITAMIN C PO) Take by mouth.    [provider]  b complex vitamins tablet Take 1 tablet by mouth daily.    [provider]  Cholecalciferol (VITAMIN D) 2000 units tablet Take 2,000 Units by mouth 3 (three) times a week. Takes 5000 units    [provider]  Zinc Sulfate (ZINC 15 PO) Take by mouth.    [provider]    Family History Family History  Problem Relation Age of Onset   Alcohol abuse Mother    Heart attack Mother    GER disease Mother    GER disease Father    Alcohol abuse Father    Stroke Father    Tuberculosis Father    Heart attack Father    Aneurysm Father    Mental illness Sister    Depression Sister    Breast cancer Neg Hx      Social History Social History   Tobacco Use   Smoking status: Never   Smokeless tobacco: Never  Vaping Use   Vaping Use: Never used  Substance Use Topics   Alcohol use: No    Alcohol/week: 0.0 standard drinks   Drug use: No     Allergies   Salmon [fish allergy], Ampicillin, Codeine, Flagyl [metronidazole], Meperidine hcl, Neomycin, Propoxyphene hcl, Sulfa antibiotics, and Tramadol hcl   Review of Systems Review of Systems As per HPI  Physical Exam Triage  Vital Signs ED Triage Vitals  Enc Vitals Group     BP 12/25/20 1004 (!) 141/65     Pulse Rate 12/25/20 1004 65     Resp 12/25/20 1004 (!) 24     Temp 12/25/20 1004 98.3 F (36.8 C)     Temp Source 12/25/20 1004 Oral     SpO2 12/25/20 1004 98 %     Weight --      Height --      Head Circumference --      Peak Flow --      Pain Score 12/25/20 0958 0     Pain Loc --      Pain Edu? --      Excl. in [place]? --    No data found.  Updated Vital Signs BP (!) 141/65 (BP Location: Right Arm)   Pulse 65   Temp 98.3 F (36.8 C) (Oral)   Resp (!) 24   SpO2 98%   Visual Acuity Right Eye Distance:   Left Eye Distance:   Bilateral Distance:    Right Eye Near:   Left Eye Near:    Bilateral Near:     Physical Exam Vitals and nursing note reviewed.  Constitutional:      General: She is not in acute distress.    Appearance: She is not ill-appearing.  HENT:     Right Ear: Tympanic membrane normal.     Left Ear: Tympanic membrane normal.     Nose: No congestion.  Cardiovascular:     Rate and Rhythm: Normal rate and regular rhythm.     Pulses: Normal pulses.     Heart sounds: Normal heart sounds.  Pulmonary:     Effort: Pulmonary effort is normal. No respiratory distress.     Breath sounds: No wheezing or rhonchi.  Abdominal:     General: Abdomen is flat.     Palpations: Abdomen is soft.  Neurological:     Mental Status: She is alert.     UC Treatments / Results  Labs (all labs ordered are listed,  but only abnormal results are displayed) Labs Reviewed  CBC  BASIC METABOLIC PANEL    EKG   Radiology DG Chest 2 View  Result Date: 12/25/2020 CLINICAL DATA:  Shortness of breath which began 6 weeks ago. Headache, congestion and cough. EXAM: CHEST - 2 VIEW COMPARISON:  CT chest 05/03/2019 FINDINGS: The heart size and mediastinal contours are within normal limits. Both lungs are clear. The visualized skeletal structures are unremarkable. IMPRESSION: No active cardiopulmonary disease. Electronically Signed   By: Kerby Moors M.D.   On: 12/25/2020 10:42    Procedures Procedures (including critical care time)  Medications Ordered in UC Medications - No data to display  Initial Impression / Assessment and Plan / UC Course  I have reviewed the triage vital signs and the nursing notes.  Pertinent labs & imaging results that were available during my care of the patient were reviewed by me and considered in my medical decision making (see chart for details).     1.  Postviral cough syndrome: Chest x-ray is negative for acute lung infiltrate CBC is normal BMP is pending Tessalon Perles as needed for cough Prednisone 20 mg orally daily for 5 days Albuterol inhaler Return to urgent care if symptoms worsen Maintain adequate hydration. Final Clinical Impressions(s) / UC Diagnoses   Final diagnoses:  Post-viral cough syndrome     Discharge Instructions      Chest x-ray is  negative for pneumonia Take medications as prescribed We will call you with recommendations if labs are abnormal Return to urgent care if symptoms worsen.   ED Prescriptions     Medication Sig Dispense Auth. Provider   benzonatate (TESSALON) 100 MG capsule Take 1 capsule (100 mg total) by mouth every 8 (eight) hours. 21 capsule Samanthan Dugo, Myrene Galas, MD   predniSONE (DELTASONE) 20 MG tablet Take 1 tablet (20 mg total) by mouth daily for 5 days. 5 tablet Adri Schloss, Myrene Galas, MD   albuterol (VENTOLIN HFA) 108 (90  Base) MCG/ACT inhaler Inhale 1 puff into the lungs every 6 (six) hours as needed for wheezing or shortness of breath. 18 g Symphani Eckstrom, Myrene Galas, MD      PDMP not reviewed this encounter.   Chase Picket, MD 12/25/20 1121

## 2020-12-25 NOTE — ED Triage Notes (Addendum)
Onset 6 weeks ago: headache, congestion, cough.  Patient has been living in an environment where construction has been performed on her home. Family member reports concern for bacterial infection.  Patient has not contacted pcp  Has been taking nyquil and dayquil

## 2020-12-25 NOTE — Discharge Instructions (Addendum)
Chest x-ray is negative for pneumonia Take medications as prescribed We will call you with recommendations if labs are abnormal Return to urgent care if symptoms worsen.

## 2021-01-14 ENCOUNTER — Ambulatory Visit (INDEPENDENT_AMBULATORY_CARE_PROVIDER_SITE_OTHER): Payer: Medicare HMO | Admitting: Gastroenterology

## 2021-01-14 ENCOUNTER — Encounter: Payer: Self-pay | Admitting: Gastroenterology

## 2021-01-14 VITALS — BP 130/88 | HR 88 | Ht 63.0 in | Wt 156.0 lb

## 2021-01-14 DIAGNOSIS — Z85048 Personal history of other malignant neoplasm of rectum, rectosigmoid junction, and anus: Secondary | ICD-10-CM | POA: Diagnosis not present

## 2021-01-14 DIAGNOSIS — Z85038 Personal history of other malignant neoplasm of large intestine: Secondary | ICD-10-CM

## 2021-01-14 MED ORDER — NA SULFATE-K SULFATE-MG SULF 17.5-3.13-1.6 GM/177ML PO SOLN: 1.0000 | ORAL | 0 refills | Status: DC

## 2021-01-14 NOTE — Patient Instructions (Addendum)
If you are age 82 or older, your body mass index should be between 23-30. Your Body mass index is 27.63 kg/m. If this is out of the aforementioned range listed, please consider follow up with your Primary Care Provider. ________________________________________________________  The [facility] providers would like to encourage you to use [facility] to communicate with providers for non-urgent requests or questions.  Due to long hold times on the telephone, sending your provider a message by [facility] may be a faster and more efficient way to get a response.  Please allow 48 business hours for a response.  Please remember that this is for non-urgent requests.  _______________________________________________________  Dennis Bast have been scheduled for a colonoscopy. Please follow written instructions given to you at your visit today.  Please pick up your prep supplies at the pharmacy within the next 1-3 days. If you use inhalers (even only as needed), please bring them with you on the day of your procedure.  Due to recent changes in healthcare laws, you may see the results of your imaging and laboratory studies on MyChart before your provider has had a chance to review them.  We understand that in some cases there may be results that are confusing or concerning to you. Not all laboratory results come back in the same time frame and the provider may be waiting for multiple results in order to interpret others.  Please give [place] 48 hours in order for your provider to thoroughly review all the results before contacting the office for clarification of your results.   You will be contacted by our office prior to your procedure for directions on holding your Eliquis.  If you do not hear from our office 1 week prior to your scheduled procedure, please call [phone] to discuss.   Thank you for entrusting me with your care and choosing [facility].  Dr [name]

## 2021-01-14 NOTE — Progress Notes (Signed)
Review of pertinent gastrointestinal problems: 1.  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  Colonoscopy August 2019 showed normal-appearing right sided ileocolonic anastomosis.  No polyps or cancers.  HPI: This is a very pleasant 80 year old woman whom I last saw a little over 3 years ago the time of a colonoscopy.  See that results summarized above.  She is here today to discuss surveillance colonoscopy given her personal history of colon cancer.  She feels quite well.  She was wondering if she was allowed to take Metamucil and I told her that would be absolutely fine.  She tends to have intermittent constipation.  She sees no rectal bleeding.  Her weight is overall stable.  She is quite vibrant and healthy, takes care of her house and her 8 horses  Old Data Reviewed:  Blood work November 2022 CBC was normal CT scan abdomen pelvis with IV and oral contrast April 2020 showed no findings for obvious recurrent tumor or adenopathy.  There was a "overall stable low-attenuation in segment 6 of the liver without any new progressive changes"    Review of systems: Pertinent positive and negative review of systems were noted in the above HPI section. All other review negative.   Past Medical History:  Diagnosis Date   Anemia    Arthritis    Astigmatism    both eyes   Cancer ([place])    colon surgery done last chemo tx 01-17-17   Cataract    Colon cancer [facility])    Coronary artery disease, non-occlusive    Dyspnea    more so exertion   Dysrhythmia    skipped beats every now and then   GERD (gastroesophageal reflux disease)    Glaucoma    both eyes right eye worse than left   Hammer toes of both feet    Headache    migraines with aura   Heel spur    both heels and front   HSV (herpes simplex virus)  infection    Hyperglycemia    denies   Hyperlipidemia    Hypertension    IBS (irritable bowel syndrome)    IC (interstitial cystitis)    Insomnia disorder related to known organic factor    Osteoporosis    PONV (postoperative nausea and vomiting)    has had more surgeries, and had no problems   Stroke [facility]) 2016   Vitamin D deficiency     Past Surgical History:  Procedure Laterality Date   ABDOMINAL HYSTERECTOMY     1 ovary and womb removed   CARDIAC CATHETERIZATION  7/12   non obst dz -- not sure what yr   CHOLECYSTECTOMY     EUS N/A 02/03/2017   Procedure: UPPER ENDOSCOPIC ULTRASOUND (EUS) LINEAR;  Surgeon: [name], MD;  [facility];  Service: Endoscopy;  Laterality: N/A;   EYE SURGERY Bilateral    ioc for cataracts    LAPAROSCOPIC RIGHT COLECTOMY Right 11/10/2016   Procedure: LAPAROSCOPIC EXTENDED RIGHT COLECTOMY;  Surgeon: [name], MD;  Location: [place];  Service: General;  Laterality: Right;   PORT-A-CATH REMOVAL Left 11/21/2019   Procedure: REMOVAL PORT-A-CATH;  Surgeon: [name], MD;  Location: [place];  Service: General;  Laterality: Left;   PORTACATH PLACEMENT Left 12/22/2016   Procedure: INSERTION PORT-A-CATH;  Surgeon: [name], MD;  Location: [place];  Service: General;  Laterality: Left;   ROTATOR CUFF REPAIR Bilateral     Current Outpatient Medications  Medication Sig Dispense Refill   Ascorbic Acid (VITAMIN C PO) Take by mouth.     b complex vitamins tablet Take 1 tablet by mouth daily.     benzonatate (TESSALON) 100 MG capsule Take 1 capsule (100 mg total) by mouth every 8 (eight) hours. 21 capsule 0   Cholecalciferol (VITAMIN D) 2000 units tablet Take 2,000 Units by mouth 3 (three) times a week. Takes 5000 units     ELIQUIS 5 MG TABS tablet TAKE 1 TABLET BY MOUTH TWICE A DAY 120 tablet 1   Multiple Vitamin (MULTIVITAMIN WITH MINERALS) TABS tablet Take 1 tablet by mouth daily.      Multiple  Vitamins-Minerals (HAIR SKIN AND NAILS FORMULA PO) Take by mouth.     rosuvastatin (CRESTOR) 20 MG tablet Take 1 tablet (20 mg total) by mouth daily. 90 tablet 3   Zinc Sulfate (ZINC 15 PO) Take by mouth.     No current facility-administered medications for this visit.    Allergies as of 01/14/2021 - Review Complete 01/14/2021  Allergen Reaction Noted   Hyman Hopes allergy] Anaphylaxis 11/23/2016   Ampicillin Nausea Only 05/06/2015   Codeine Nausea And Vomiting 03/17/2007   Flagyl [metronidazole] Nausea And Vomiting 11/10/2016   Meperidine hcl Nausea And Vomiting 03/17/2007   Neomycin Nausea And Vomiting 11/23/2016   Propoxyphene hcl Nausea And Vomiting 03/17/2007   Sulfa antibiotics Hives 05/06/2015   Tramadol hcl Nausea And Vomiting     Family History  Problem Relation Age of Onset   Alcohol abuse Mother    Heart attack Mother    GER disease Mother    GER disease Father    Alcohol abuse Father    Stroke Father    Tuberculosis Father    Heart attack Father    Aneurysm Father    Mental illness Sister    Depression Sister    Breast cancer Neg Hx     Social History   Socioeconomic History   Marital status: Married    Spouse name: Not on file   Number of children: 4   Years of education: Not on file   Highest education level: Not on file  Occupational History   Occupation: reitred    Fish farm manager: RETIRED  Tobacco Use   Smoking status: Never   Smokeless tobacco: Never  Vaping Use   Vaping Use: Never used  Substance and Sexual Activity   Alcohol use: No    Alcohol/week: 0.0 standard drinks   Drug use: No   Sexual activity: Not on file  Other Topics Concern   Not on file  Social History Narrative   Rides horses         Specialty providers    [facility]   [facility]   [facility]   ENT-- [employer]   Social Determinants of Health   Financial Resource Strain: Low Risk    Difficulty of Paying Living Expenses: Not  hard at all  Food Insecurity: No Food Insecurity   Worried About Charity fundraiser in the Last Year: Never true   Arboriculturist in the Last Year: Never true  Transportation Needs: No Transportation Needs   Lack of Transportation (Medical): No   Lack of Transportation (Non-Medical): No  Physical Activity: Inactive   Days of Exercise per Week: 0 days   Minutes of Exercise per Session:  0 min  Stress: No Stress Concern Present   Feeling of Stress : Not at all  Social Connections: Not on file  Intimate Partner Violence: Not At Risk   Fear of Current or Ex-Partner: No   Emotionally Abused: No   Physically Abused: No   Sexually Abused: No     Physical Exam: BP 130/88   Pulse 88   Ht 5\' 3"  (1.6 m)   Wt 156 lb (70.8 kg)   BMI 27.63 kg/m  Constitutional: generally well-appearing Psychiatric: alert and oriented x3 Eyes: extraocular movements intact Mouth: oral pharynx moist, no lesions Neck: supple no lymphadenopathy Cardiovascular: heart regular rate and rhythm Lungs: clear to auscultation bilaterally Abdomen: soft, nontender, nondistended, no obvious ascites, no peritoneal signs, normal bowel sounds Extremities: no lower extremity edema bilaterally Skin: no lesions on visible extremities   Assessment and plan: 80 y.o. female with personal history of colon cancer, ongoing blood thinner use  We discussed the fact that she is 68 and oftentimes surveillance examinations such as hers and after the age of 62 or so.  She is quite vibrant and healthy takes care of her house and her 8 horses and I think probably it is still a reasonable clinical situation for her to undergo surveillance colonoscopy.  She knows she will need to hold her Eliquis for 2 days prior, we will get in touch with the prescribing physician to make sure they are okay with that recommendation as well.  I see no reason for a further blood tests or imaging studies prior to then.     Please see the "Patient  Instructions" section for addition details about the plan.   Owens Loffler, MD [facility] 01/14/2021, 11:33 AM  Cc: Tower, [name], MD  Total time on date of encounter was 40 minutes (this included time spent preparing to see the patient reviewing records; obtaining and/or reviewing separately obtained history; performing a medically appropriate exam and/or evaluation; counseling and educating the patient and family if present; ordering medications, tests or procedures if applicable; and documenting clinical information in the health record).

## 2021-01-19 ENCOUNTER — Telehealth: Payer: Self-pay

## 2021-01-19 NOTE — Telephone Encounter (Signed)
   Kiyra Slaubaugh [birth year] [id number]  Dear [facility]:  We have scheduled the above named patient for a colonoscopy procedure. Our records show that she is on anticoagulation therapy.  Please advise as to whether the patient may come off their therapy of Eliquis 2 days prior to their procedure which is scheduled for 03-11-21.  Please route your response to Harrel Lemon, CMA or fax response to [phone].  Sincerely,    [facility]

## 2021-01-21 NOTE — Telephone Encounter (Signed)
Yes, she can  Thank you

## 2021-01-21 NOTE — Telephone Encounter (Signed)
Please advise if patient can hold Eliquis 2 days before colonoscopy.

## 2021-01-22 NOTE — Telephone Encounter (Signed)
Attempted to reach patient by phone, but there was no answer and there was no answering machine.  Will continue efforts.

## 2021-01-26 NOTE — Telephone Encounter (Signed)
Patient advised that she has been given clearance to hold Eliquis 2 days prior to colonoscopy scheduled for 03-11-20.  Patient advised to take last dose of Eliquis on 03-08-20, and she will be advised when to restart Eliquis by Dr [name] after the procedure.  Patient agreed to plan and verbalized understanding.  No further questions.

## 2021-02-19 ENCOUNTER — Other Ambulatory Visit: Payer: Self-pay | Admitting: Hematology

## 2021-02-26 ENCOUNTER — Other Ambulatory Visit: Payer: Self-pay | Admitting: *Deleted

## 2021-02-26 ENCOUNTER — Telehealth: Payer: Self-pay | Admitting: *Deleted

## 2021-02-26 MED ORDER — APIXABAN 5 MG PO TABS: 5.0000 mg | ORAL_TABLET | Freq: Two times a day (BID) | ORAL | 1 refills | Status: DC

## 2021-02-26 NOTE — Telephone Encounter (Signed)
Called pt to make aware, per Dr.Feng, pt is to contact her PCP or cardiologist for Eliquis refills for her atrial fib. Pt verbalized understanding

## 2021-03-03 ENCOUNTER — Encounter: Payer: Self-pay | Admitting: Hematology

## 2021-03-03 ENCOUNTER — Telehealth: Payer: Self-pay | Admitting: Gastroenterology

## 2021-03-03 NOTE — Telephone Encounter (Signed)
Patient called asking when she needs to stop taking her blood thinner medication. Seeking advice, please advise.

## 2021-03-03 NOTE — Telephone Encounter (Signed)
Left message for patient to return call to discuss holding Eliquis prior to procedure scheduled for 03-11-21.  Will continue efforts.

## 2021-03-04 NOTE — Telephone Encounter (Signed)
Left message for patient to return call to discuss holding Eliquis prior to procedure scheduled for 03-11-21.  Will continue efforts.

## 2021-03-05 NOTE — Telephone Encounter (Signed)
Patient advised that she has been given clearance to hold Eliquis 2 days prior to colonoscopy scheduled for 03-11-20.  Patient advised to take last dose of Eliquis on 03-08-20, and she will be advised when to restart Eliquis by Dr [name] after the procedure.  Patient agreed to plan and verbalized understanding.  No further questions.

## 2021-03-11 ENCOUNTER — Encounter: Payer: Self-pay | Admitting: Gastroenterology

## 2021-03-11 ENCOUNTER — Ambulatory Visit (AMBULATORY_SURGERY_CENTER): Payer: Medicare HMO | Admitting: Gastroenterology

## 2021-03-11 VITALS — BP 143/59 | HR 66 | Temp 97.1°F | Resp 16 | Ht 69.0 in | Wt 156.0 lb

## 2021-03-11 DIAGNOSIS — K5140 Inflammatory polyps of colon without complications: Secondary | ICD-10-CM

## 2021-03-11 DIAGNOSIS — Z85048 Personal history of other malignant neoplasm of rectum, rectosigmoid junction, and anus: Secondary | ICD-10-CM | POA: Diagnosis not present

## 2021-03-11 DIAGNOSIS — D125 Benign neoplasm of sigmoid colon: Secondary | ICD-10-CM

## 2021-03-11 DIAGNOSIS — K633 Ulcer of intestine: Secondary | ICD-10-CM | POA: Diagnosis not present

## 2021-03-11 DIAGNOSIS — E669 Obesity, unspecified: Secondary | ICD-10-CM | POA: Diagnosis not present

## 2021-03-11 MED ORDER — SODIUM CHLORIDE 0.9 % IV SOLN: 500.0000 mL | Freq: Once | INTRAVENOUS | Status: DC

## 2021-03-11 NOTE — Op Note (Signed)
[place] Patient Name: [place] Procedure Date: 03/11/2021 10:28 AM MRN: [id number] Endoscopist: [name] , MD Age: 81 Referring MD:  Date of Birth: 01/17/1941 Gender: Female Account #: [id number] Procedure:                Colonoscopy Indications:              High risk colon cancer surveillance: Personal                            history of colon cancer; Synchronous colon cancer,                            moderately differentiated, located in the hepatic                            flexure and distal transverse colon, T3N1 with                            indeterminate liver lesions at least stage IIIb,                            underwent extended right hemicolectomy September                            2018. Colonoscopy August 2019 showed                            normal-appearing right sided ileocolonic                            anastomosis. No polyps or cancers. Medicines:                Monitored Anesthesia Care Procedure:                Pre-Anesthesia Assessment:                           - Prior to the procedure, a History and Physical                            was performed, and patient medications and                            allergies were reviewed. The patient's tolerance of                            previous anesthesia was also reviewed. The risks                            and benefits of the procedure and the sedation                            options and risks were discussed with the patient.  All questions were answered, and informed consent                            was obtained. Prior Anticoagulants: The patient has                            taken Eliquis (apixaban), last dose was 2 days                            prior to procedure. ASA Grade Assessment: II - A                            patient with mild systemic disease. After reviewing                            the risks and benefits, the patient  was deemed in                            satisfactory condition to undergo the procedure.                           After obtaining informed consent, the colonoscope                            was passed under direct vision. Throughout the                            procedure, the patient's blood pressure, pulse, and                            oxygen saturations were monitored continuously. The                            Olympus PCF-H190DL (#[id number]) Colonoscope was                            introduced through the anus and advanced to the the                            colocolonic anastomosis. The colonoscopy was                            performed without difficulty. The patient tolerated                            the procedure well. The quality of the bowel                            preparation was good. Tha anastomosis and rectum                            were photographed. Scope In: 10:32:42 AM Scope Out: 10:45:45 AM Scope Withdrawal Time: 0 hours 6 minutes 44 seconds  Total Procedure  Duration: 0 hours 13 minutes 3 seconds  Findings:                 There was a small (3-10mm) friable nodule at the                            otherwise normal ileocolonic anastomosis. This was                            biopsied, jar 1.                           A 5 mm polyp was found in the sigmoid colon. The                            polyp was sessile. The polyp was removed with a                            cold snare. Resection and retrieval were complete.                            jar 2                           Internal and external hemorrhoids.                           The exam was otherwise without abnormality on                            direct and retroflexion views. Complications:            No immediate complications. Estimated blood loss:                            None. Estimated Blood Loss:     Estimated blood loss: none. Impression:               - There was a small (3-31mm)  friable nodule at the                            otherwise normal ileocolonic anastomosis. This was                            biopsied, jar 1.                           - One 5 mm polyp in the sigmoid colon, removed with                            a cold snare. Resected and retrieved. jar 2                           - The examination was otherwise normal on direct  and retroflexion views. Recommendation:           - Patient has a contact number available for                            emergencies. The signs and symptoms of potential                            delayed complications were discussed with the                            patient. Return to normal activities tomorrow.                            Written discharge instructions were provided to the                            patient.                           - Resume previous diet.                           - Continue present medications. You can resume your                            eliquis tomorrow.                           - Await pathology results. [name], MD 03/11/2021 10:50:55 AM This report has been signed electronically.

## 2021-03-11 NOTE — Progress Notes (Signed)
VS-DT 

## 2021-03-11 NOTE — Patient Instructions (Addendum)
Thank you for allowing [place] to care for you today. Await biopsy results, approximately 2 weeks. Resume medications today, resume ELIQUIS tomorrow, 03/12/21. Return to normal daily activities tomorrow, 03/12/21.  YOU HAD AN ENDOSCOPIC PROCEDURE TODAY AT [facility]:   Refer to the procedure report that was given to you for any specific questions about what was found during the examination.  If the procedure report does not answer your questions, please call your gastroenterologist to clarify.  If you requested that your care partner not be given the details of your procedure findings, then the procedure report has been included in a sealed envelope for you to review at your convenience later.  YOU SHOULD EXPECT: Some feelings of bloating in the abdomen. Passage of more gas than usual.  Walking can help get rid of the air that was put into your GI tract during the procedure and reduce the bloating. If you had a lower endoscopy (such as a colonoscopy or flexible sigmoidoscopy) you may notice spotting of blood in your stool or on the toilet paper. If you underwent a bowel prep for your procedure, you may not have a normal bowel movement for a few days.  Please Note:  You might notice some irritation and congestion in your nose or some drainage.  This is from the oxygen used during your procedure.  There is no need for concern and it should clear up in a day or so.  SYMPTOMS TO REPORT IMMEDIATELY:  Following lower endoscopy (colonoscopy or flexible sigmoidoscopy):  Excessive amounts of blood in the stool  Significant tenderness or worsening of abdominal pains  Swelling of the abdomen that is new, acute  Fever of 100F or higher   For urgent or emergent issues, a gastroenterologist can be reached at any hour by calling [phone]. Do not use MyChart messaging for urgent concerns.    DIET:  We do recommend a small meal at first, but then you may proceed to your regular diet.  Drink  plenty of fluids but you should avoid alcoholic beverages for 24 hours.  ACTIVITY:  You should plan to take it easy for the rest of today and you should NOT DRIVE or use heavy machinery until tomorrow (because of the sedation medicines used during the test).    FOLLOW UP: Our staff will call the number listed on your records 48-72 hours following your procedure to check on you and address any questions or concerns that you may have regarding the information given to you following your procedure. If we do not reach you, we will leave a message.  We will attempt to reach you two times.  During this call, we will ask if you have developed any symptoms of COVID 19. If you develop any symptoms (ie: fever, flu-like symptoms, shortness of breath, cough etc.) before then, please call [phone].  If you test positive for Covid 19 in the 2 weeks post procedure, please call and report this information to [place].    If any biopsies were taken you will be contacted by phone or by letter within the next 1-3 weeks.  Please call us at [phone] if you have not heard about the biopsies in 3 weeks.    SIGNATURES/CONFIDENTIALITY: You and/or your care partner have signed paperwork which will be entered into your electronic medical record.  These signatures attest to the fact that that the information above on your After Visit Summary has been reviewed and is understood.  Full responsibility of  the confidentiality of this discharge information lies with you and/or your care-partner.

## 2021-03-11 NOTE — Progress Notes (Signed)
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  Colonoscopy August 2019 showed normal-appearing right sided ileocolonic anastomosis.  No polyps or cancers  HPI: This is a woman with h/o crc   ROS: complete GI ROS as described in HPI, all other review negative.  Constitutional:  No unintentional weight loss   Past Medical History:  Diagnosis Date   Anemia    Arthritis    Astigmatism    both eyes   Cancer (HCC)    colon surgery done last chemo tx 01-17-17   Cataract    Colon cancer [facility])    Coronary artery disease, non-occlusive    Dyspnea    more so exertion   Dysrhythmia    skipped beats every now and then   GERD (gastroesophageal reflux disease)    Glaucoma    both eyes right eye worse than left   Hammer toes of both feet    Headache    migraines with aura   Heel spur    both heels and front   HSV (herpes simplex virus) infection    Hyperglycemia    denies   Hyperlipidemia    Hypertension    IBS (irritable bowel syndrome)    IC (interstitial cystitis)    Insomnia disorder related to known organic factor    Osteoporosis    PONV (postoperative nausea and vomiting)    has had more surgeries, and had no problems   Stroke [facility]) 2016   Vitamin D deficiency     Past Surgical History:  Procedure Laterality Date   ABDOMINAL HYSTERECTOMY     1 ovary and womb removed   CARDIAC CATHETERIZATION  7/12   non obst dz -- not sure what yr   CHOLECYSTECTOMY     EUS N/A 02/03/2017   Procedure: UPPER ENDOSCOPIC ULTRASOUND (EUS) LINEAR;  Surgeon: [name], MD;  Location: [facility];  Service: Endoscopy;  Laterality: N/A;   EYE SURGERY Bilateral    ioc for cataracts    LAPAROSCOPIC RIGHT COLECTOMY Right 11/10/2016   Procedure: LAPAROSCOPIC EXTENDED RIGHT COLECTOMY;  Surgeon: [name],  MD;  Location: [facility];  Service: General;  Laterality: Right;   PORT-A-CATH REMOVAL Left 11/21/2019   Procedure: REMOVAL PORT-A-CATH;  Surgeon: [name], MD;  Location: [place];  Service: General;  Laterality: Left;   PORTACATH PLACEMENT Left 12/22/2016   Procedure: INSERTION PORT-A-CATH;  Surgeon: [name], MD;  Location: [place];  Service: General;  Laterality: Left;   ROTATOR CUFF REPAIR Bilateral     Current Outpatient Medications  Medication Sig Dispense Refill   apixaban (ELIQUIS) 5 MG TABS tablet Take 1 tablet (5 mg total) by mouth 2 (two) times daily. 120 tablet 1   Ascorbic Acid (VITAMIN C PO) Take by mouth.     b complex vitamins tablet Take 1 tablet by mouth daily.     benzonatate (TESSALON) 100 MG capsule Take 1 capsule (100 mg total) by mouth every 8 (eight) hours. 21 capsule 0   Cholecalciferol (VITAMIN D) 2000 units tablet Take 2,000 Units by mouth 3 (three) times a week. Takes 5000 units     ELIQUIS 5 MG TABS tablet TAKE 1 TABLET BY MOUTH TWICE A DAY 120 tablet 1   Multiple Vitamin (MULTIVITAMIN WITH MINERALS) TABS tablet Take 1 tablet by mouth daily.      Multiple Vitamins-Minerals (  HAIR SKIN AND NAILS FORMULA PO) Take by mouth.     rosuvastatin (CRESTOR) 20 MG tablet Take 1 tablet (20 mg total) by mouth daily. 90 tablet 3   Zinc Sulfate (ZINC 15 PO) Take by mouth.     Current Facility-Administered Medications  Medication Dose Route Frequency Provider Last Rate Last Admin   0.9 %  sodium chloride infusion  500 mL Intravenous Once [name], MD        Allergies as of 03/11/2021 - Review Complete 03/11/2021  Allergen Reaction Noted   Hyman Hopes allergy] Anaphylaxis 11/23/2016   Ampicillin Nausea Only 05/06/2015   Codeine Nausea And Vomiting 03/17/2007   Flagyl [metronidazole] Nausea And Vomiting 11/10/2016   Meperidine hcl Nausea And Vomiting 03/17/2007   Neomycin Nausea And Vomiting 11/23/2016   Propoxyphene hcl  Nausea And Vomiting 03/17/2007   Sulfa antibiotics Hives 05/06/2015   Tramadol hcl Nausea And Vomiting     Family History  Problem Relation Age of Onset   Alcohol abuse Mother    Heart attack Mother    GER disease Mother    GER disease Father    Alcohol abuse Father    Stroke Father    Tuberculosis Father    Heart attack Father    Aneurysm Father    Mental illness Sister    Depression Sister    Breast cancer Neg Hx     Social History   Socioeconomic History   Marital status: Married    Spouse name: Not on file   Number of children: 4   Years of education: Not on file   Highest education level: Not on file  Occupational History   Occupation: reitred    Fish farm manager: RETIRED  Tobacco Use   Smoking status: Never   Smokeless tobacco: Never  Vaping Use   Vaping Use: Never used  Substance and Sexual Activity   Alcohol use: No    Alcohol/week: 0.0 standard drinks   Drug use: No   Sexual activity: Not on file  Other Topics Concern   Not on file  Social History Narrative   Rides horses         Specialty providers    [facility] [place]   [facility]   [facility]   [facility]-- [facility]   Social Determinants of Health   Financial Resource Strain: Low Risk    Difficulty of Paying Living Expenses: Not hard at all  Food Insecurity: No Food Insecurity   Worried About Charity fundraiser in the Last Year: Never true   Arboriculturist in the Last Year: Never true  Transportation Needs: No Transportation Needs   Lack of Transportation (Medical): No   Lack of Transportation (Non-Medical): No  Physical Activity: Inactive   Days of Exercise per Week: 0 days   Minutes of Exercise per Session: 0 min  Stress: No Stress Concern Present   Feeling of Stress : Not at all  Social Connections: Not on file  Intimate Partner Violence: Not At Risk   Fear of Current or Ex-Partner: No   Emotionally Abused: No   Physically Abused: No   Sexually Abused:  No     Physical Exam: BP 134/62    Pulse 92    Temp (!) 97.1 F (36.2 C)    Ht 5\' 9"  (1.753 m)    Wt 156 lb (70.8 kg)    SpO2 98%    BMI 23.04 kg/m  Constitutional: generally well-appearing Psychiatric:  alert and oriented x3 Lungs: CTA bilaterally Heart: no MCR  Assessment and plan: 81 y.o. female with personal h/o CRC  For surveillance colonoscopy today  Care is appropriate for the ambulatory setting.  Owens Loffler, MD [facility] 03/11/2021, 10:20 AM

## 2021-03-11 NOTE — Progress Notes (Signed)
Called to room to assist during endoscopic procedure.  Patient ID and intended procedure confirmed with present staff. Received instructions for my participation in the procedure from the performing physician.  

## 2021-03-11 NOTE — Progress Notes (Signed)
Report to PACU, RN, vss, BBS= Clear.  

## 2021-03-13 ENCOUNTER — Telehealth: Payer: Self-pay

## 2021-03-13 ENCOUNTER — Telehealth: Payer: Self-pay | Admitting: *Deleted

## 2021-03-13 NOTE — Telephone Encounter (Signed)
Left message on follow up call. 

## 2021-03-13 NOTE — Telephone Encounter (Signed)
°  Follow up Call-  Call back number 03/11/2021  Post procedure Call Back phone  # [phone]  Permission to leave phone message Yes  Some recent data might be hidden     Patient questions:  Do you have a fever, pain , or abdominal swelling? No. Pain Score  0 *  Have you tolerated food without any problems? Yes.    Have you been able to return to your normal activities? Yes.    Do you have any questions about your discharge instructions: Diet   No. Medications  No. Follow up visit  No.  Do you have questions or concerns about your Care? No.  Actions: * If pain score is 4 or above: No action needed, pain <4.  Have you developed a fever since your procedure? no  2.   Have you had an respiratory symptoms (SOB or cough) since your procedure? no  3.   Have you tested positive for COVID 19 since your procedure no  4.   Have you had any family members/close contacts diagnosed with the COVID 19 since your procedure?  no   If yes to any of these questions please route to Joylene John, RN and Joella Prince, RN

## 2021-03-17 ENCOUNTER — Encounter: Payer: Self-pay | Admitting: Gastroenterology

## 2021-03-26 NOTE — Progress Notes (Signed)
Subjective:   [name] is a 81 y.o. female who presents for Medicare Annual (Subsequent) preventive examination.  I connected with [name] today by telephone and verified that I am speaking with the correct person using two identifiers. Location patient: home Location provider: work Persons participating in the virtual visit: patient, Marine scientist.    I discussed the limitations, risks, security and privacy concerns of performing an evaluation and management service by telephone and the availability of in person appointments. I also discussed with the patient that there may be a patient responsible charge related to this service. The patient expressed understanding and verbally consented to this telephonic visit.    Interactive audio and video telecommunications were attempted between this provider and patient, however failed, due to patient having technical difficulties OR patient did not have access to video capability.  We continued and completed visit with audio only.  Some vital signs may be absent or patient reported.   Time Spent with patient on telephone encounter: 30 minutes  Review of Systems     Cardiac Risk Factors include: advanced age (>42men, >24 women);hypertension;dyslipidemia     Objective:    Today's Vitals   03/27/21 1115  Weight: 150 lb (68 kg)  Height: 5\' 3"  (1.6 m)   Body mass index is 26.57 kg/m.  Advanced Directives 03/26/2020 12/12/2019 11/21/2019 11/16/2019 01/31/2018 05/16/2017 04/11/2017  Does Patient Have a Medical Advance Directive? No No No No No No No  Does patient want to make changes to medical advance directive? - No - Patient declined - - No - Patient declined - -  Would patient like information on creating a medical advance directive? No - Patient declined - No - Patient declined No - Patient declined - - -    Current Medications (verified) Outpatient Encounter Medications as of 03/27/2021  Medication Sig   apixaban  (ELIQUIS) 5 MG TABS tablet Take 1 tablet (5 mg total) by mouth 2 (two) times daily.   Ascorbic Acid (VITAMIN C PO) Take by mouth.   b complex vitamins tablet Take 1 tablet by mouth daily.   benzonatate (TESSALON) 100 MG capsule Take 1 capsule (100 mg total) by mouth every 8 (eight) hours.   Cholecalciferol (VITAMIN D) 2000 units tablet Take 2,000 Units by mouth 3 (three) times a week. Takes 5000 units   ELIQUIS 5 MG TABS tablet TAKE 1 TABLET BY MOUTH TWICE A DAY   Multiple Vitamin (MULTIVITAMIN WITH MINERALS) TABS tablet Take 1 tablet by mouth daily.    rosuvastatin (CRESTOR) 20 MG tablet Take 1 tablet (20 mg total) by mouth daily.   Multiple Vitamins-Minerals (HAIR SKIN AND NAILS FORMULA PO) Take by mouth. (Patient not taking: Reported on 03/27/2021)   Zinc Sulfate (ZINC 15 PO) Take by mouth. (Patient not taking: Reported on 03/27/2021)   No facility-administered encounter medications on file as of 03/27/2021.    Allergies (verified) Salmon [fish allergy], Ampicillin, Codeine, Flagyl [metronidazole], Meperidine hcl, Neomycin, Propoxyphene hcl, Sulfa antibiotics, and Tramadol hcl   History: Past Medical History:  Diagnosis Date   Anemia    Arthritis    Astigmatism    both eyes   Cancer ([place])    colon surgery done last chemo tx 01-17-17   Cataract    Colon cancer [facility]    Coronary artery disease, non-occlusive    Dyspnea    more so exertion   Dysrhythmia    skipped beats every now and then   GERD (gastroesophageal reflux disease)  Glaucoma    both eyes right eye worse than left   Hammer toes of both feet    Headache    migraines with aura   Heel spur    both heels and front   HSV (herpes simplex virus) infection    Hyperglycemia    denies   Hyperlipidemia    Hypertension    IBS (irritable bowel syndrome)    IC (interstitial cystitis)    Insomnia disorder related to known organic factor    Osteoporosis    PONV (postoperative nausea and vomiting)    has had more surgeries,  and had no problems   Stroke [facility]) 2016   Vitamin D deficiency    Past Surgical History:  Procedure Laterality Date   ABDOMINAL HYSTERECTOMY     1 ovary and womb removed   CARDIAC CATHETERIZATION  7/12   non obst dz -- not sure what yr   CHOLECYSTECTOMY     EUS N/A 02/03/2017   Procedure: UPPER ENDOSCOPIC ULTRASOUND (EUS) LINEAR;  Surgeon: [name], MD;  Location: [facility];  Service: Endoscopy;  Laterality: N/A;   EYE SURGERY Bilateral    ioc for cataracts    LAPAROSCOPIC RIGHT COLECTOMY Right 11/10/2016   Procedure: LAPAROSCOPIC EXTENDED RIGHT COLECTOMY;  Surgeon: [name], MD;  Location: [place];  Service: General;  Laterality: Right;   PORT-A-CATH REMOVAL Left 11/21/2019   Procedure: REMOVAL PORT-A-CATH;  Surgeon: [name], MD;  Location: [place];  Service: General;  Laterality: Left;   PORTACATH PLACEMENT Left 12/22/2016   Procedure: INSERTION PORT-A-CATH;  Surgeon: [name], MD;  Location: [place];  Service: General;  Laterality: Left;   ROTATOR CUFF REPAIR Bilateral    Family History  Problem Relation Age of Onset   Alcohol abuse Mother    Heart attack Mother    GER disease Mother    GER disease Father    Alcohol abuse Father    Stroke Father    Tuberculosis Father    Heart attack Father    Aneurysm Father    Mental illness Sister    Depression Sister    Breast cancer Neg Hx    Social History   Socioeconomic History   Marital status: Widowed    Spouse name: Not on file   Number of children: 4   Years of education: Not on file   Highest education level: Not on file  Occupational History   Occupation: reitred    Employer: RETIRED  Tobacco Use   Smoking status: Never   Smokeless tobacco: Never  Vaping Use   Vaping Use: Never used  Substance and Sexual Activity   Alcohol use: No    Alcohol/week: 0.0 standard drinks   Drug use: No   Sexual activity: Not on file  Other Topics Concern   Not on file   Social History Narrative   Rides horses         Specialty providers    [facility] [place]   [facility]   [facility]   ENT-- [facility]   Social Determinants of Health   Financial Resource Strain: Low Risk    Difficulty of Paying Living Expenses: Not very hard  Food Insecurity: No Food Insecurity   Worried About Charity fundraiser in the Last Year: Never true   [place] in the Last Year: Never true  Transportation Needs: No Transportation Needs   Lack of Transportation (Medical): No  Lack of Transportation (Non-Medical): No  Physical Activity: Unknown   Days of Exercise per Week: 7 days   Minutes of Exercise per Session: Not on file  Stress: No Stress Concern Present   Feeling of Stress : Only a little  Social Connections: Socially Isolated   Frequency of Communication with Friends and Family: More than three times a week   Frequency of Social Gatherings with Friends and Family: More than three times a week   Attends Religious Services: Never   Marine scientist or Organizations: No   Attends Archivist Meetings: Never   Marital Status: Widowed    Tobacco Counseling Counseling given: Not Answered   Clinical Intake:  Pre-visit preparation completed: Yes  Pain : No/denies pain     BMI - recorded: 26.57 Nutritional Status: BMI 25 -29 Overweight Nutritional Risks: None Diabetes: No  How often do you need to have someone help you when you read instructions, pamphlets, or other written materials from your doctor or pharmacy?: 1 - Never  Diabetic? No  Interpreter Needed?: No  Information entered by :: Orrin Brigham LPN   Activities of Daily Living In your present state of health, do you have any difficulty performing the following activities: 03/27/2021  Hearing? N  Vision? Y  Difficulty concentrating or making decisions? N  Walking or climbing stairs? N  Dressing or bathing? N  Doing errands,  shopping? N  Preparing Food and eating ? N  Using the Toilet? N  In the past six months, have you accidently leaked urine? Y  Comment patient states noturia  Do you have problems with loss of bowel control? N  Managing your Medications? N  Managing your Finances? N  Housekeeping or managing your Housekeeping? N  Some recent data might be hidden    Patient Care Team: Tower, [name], MD as PCP - General [name], MD as Consulting Physician (Hematology) [name], MD as Attending Physician (Gastroenterology) [name], MD as Consulting Physician (General Surgery)  Indicate any recent Medical Services you may have received from other than [facility] providers in the past year (date may be approximate).     Assessment:   This is a routine wellness examination for [name].  Hearing/Vision screen Hearing Screening - Comments:: Decrease in hearing  Vision Screening - Comments:: Last exam over a year, plans to make an appointment, wears glasses   Dietary issues and exercise activities discussed: Current Exercise Habits: The patient has a physically strenuous job, but has no regular exercise apart from work.   Goals Addressed             This Visit's Progress    Patient Stated       Would like to drink more water and eat healthier foods       Depression Screen PHQ 2/9 Scores 03/27/2021 03/26/2020 08/01/2017 07/28/2016  PHQ - 2 Score 0 0 1 0  PHQ- 9 Score - 0 6 -    Fall Risk Fall Risk  03/27/2021 03/26/2020 08/01/2017 07/28/2016  Falls in the past year? 1 1 Yes Yes  Comment - - - pt reports fall with injury but no medical treatment  Number falls in past yr: 0 0 2 or more 1  Injury with Fall? 0 0 No No  Comment was knocked down by horse - - -  Risk for fall due to : No Fall Risks Medication side effect - -  Follow up Falls prevention discussed Falls evaluation completed;Falls prevention discussed  Falls evaluation completed -    FALL RISK PREVENTION PERTAINING TO THE  HOME:  Any stairs in or around the home? Yes  If so, are there any without handrails? No  Home free of loose throw rugs in walkways, pet beds, electrical cords, etc? Yes  Adequate lighting in your home to reduce risk of falls? Yes   ASSISTIVE DEVICES UTILIZED TO PREVENT FALLS:  Life alert? No  Use of a cane, walker or w/c? No  Grab bars in the bathroom? No  Shower chair or bench in shower? Yes  Elevated toilet seat or a handicapped toilet? Yes   TIMED UP AND GO:  Was the test performed? No .    Cognitive Function: Normal cognitive status assessed by this Nurse Health Advisor. No abnormalities found.   MMSE - Mini Mental State Exam 03/26/2020 07/28/2016  Orientation to time 5 5  Orientation to Place 5 5  Registration 3 3  Attention/ Calculation 5 0  Recall 3 3  Language- name 2 objects - 0  Language- repeat 1 1  Language- follow 3 step command - 3  Language- read & follow direction - 0  Write a sentence - 0  Copy design - 0  Total score - 20        Immunizations Immunization History  Administered Date(s) Administered   Fluad Quad(high Dose 65+) 12/04/2018, 11/15/2019   Influenza Split 12/29/2010, 11/18/2011   Influenza, High Dose Seasonal PF 12/29/2016, 01/14/2018   Influenza,inj,Quad PF,6+ Mos 03/02/2013   Influenza-Unspecified 11/22/2013, 11/23/2015   PFIZER(Purple Top)SARS-COV-2 Vaccination 06/02/2019, 06/23/2019, 03/07/2020   Pneumococcal Conjugate-13 07/28/2016   Pneumococcal Polysaccharide-23 04/30/2008   Td 04/30/2008, 04/17/2014   Zoster, Live 06/11/2010    TDAP status: Up to date  Flu Vaccine status: Due, Education has been provided regarding the importance of this vaccine. Advised may receive this vaccine at local pharmacy or Health Dept. Aware to provide a copy of the vaccination record if obtained from local pharmacy or Health Dept. Verbalized acceptance and understanding.  Pneumococcal vaccine status: Up to date  Covid-19 vaccine status:  Information provided on how to obtain vaccines.   Qualifies for Shingles Vaccine? Yes   Zostavax completed Yes   Shingrix Completed?: No.    Education has been provided regarding the importance of this vaccine. Patient has been advised to call insurance company to determine out of pocket expense if they have not yet received this vaccine. Advised may also receive vaccine at local pharmacy or Health Dept. Verbalized acceptance and understanding.  Screening Tests Health Maintenance  Topic Date Due   Zoster Vaccines- Shingrix (1 of 2) Never done   MAMMOGRAM  08/20/2018   COVID-19 Vaccine (4 - Booster for Pfizer series) 05/02/2020   INFLUENZA VACCINE  09/22/2020   TETANUS/TDAP  04/17/2024   Pneumonia Vaccine 54+ Years old  Completed   DEXA SCAN  Completed   HPV VACCINES  Aged Out   COLONOSCOPY (Pts 45-74yrs Insurance coverage will need to be confirmed)  Discontinued    Health Maintenance  Health Maintenance Due  Topic Date Due   Zoster Vaccines- Shingrix (1 of 2) Never done   MAMMOGRAM  08/20/2018   COVID-19 Vaccine (4 - Booster for Pfizer series) 05/02/2020   INFLUENZA VACCINE  09/22/2020    Colorectal cancer screening: Type of screening: Colonoscopy. Completed 03/11/21. Repeat every 3 years  Mammogram status: Ordered 03/27/21. Pt provided with contact info and advised to call to schedule appt.   Bone Density status: Ordered 03/27/21. Pt provided  with contact info and advised to call to schedule appt.  Lung Cancer Screening: (Low Dose CT Chest recommended if Age 35-80 years, 30 pack-year currently smoking OR have quit w/in 15years.) does not qualify.     Additional Screening:  Hepatitis C Screening: does not qualify  Vision Screening: Recommended annual ophthalmology exams for early detection of glaucoma and other disorders of the eye. Is the patient up to date with their annual eye exam?  No  Who is the provider or what is the name of the office in which the patient attends  annual eye exams? Provider information unavailable   Dental Screening: Recommended annual dental exams for proper oral hygiene  Community Resource Referral / Chronic Care Management: CRR required this visit?  No  CCM required this visit?  Yes , patient needs financial assistance covering Eliquis cost     Plan:     I have personally reviewed and noted the following in the patients chart:   Medical and social history Use of alcohol, tobacco or illicit drugs  Current medications and supplements including opioid prescriptions.  Functional ability and status Nutritional status Physical activity Advanced directives List of other physicians Hospitalizations, surgeries, and ER visits in previous 12 months Vitals Screenings to include cognitive, depression, and falls Referrals and appointments  In addition, I have reviewed and discussed with patient certain preventive protocols, quality metrics, and best practice recommendations. A written personalized care plan for preventive services as well as general preventive health recommendations were provided to patient.   Due to this being a telephonic visit, the after visit summary with patients personalized plan was offered to patient via mail or my-chart. Patient preferred to pick up at office at next visit.   Loma Messing, LPN   02/27/1094   Nurse Health Advisor  Nurse Notes: none

## 2021-03-27 ENCOUNTER — Ambulatory Visit (INDEPENDENT_AMBULATORY_CARE_PROVIDER_SITE_OTHER): Payer: Medicare HMO

## 2021-03-27 VITALS — Ht 63.0 in | Wt 150.0 lb

## 2021-03-27 DIAGNOSIS — Z1231 Encounter for screening mammogram for malignant neoplasm of breast: Secondary | ICD-10-CM | POA: Diagnosis not present

## 2021-03-27 DIAGNOSIS — Z780 Asymptomatic menopausal state: Secondary | ICD-10-CM | POA: Diagnosis not present

## 2021-03-27 DIAGNOSIS — Z0000 Encounter for general adult medical examination without abnormal findings: Secondary | ICD-10-CM

## 2021-03-27 NOTE — Patient Instructions (Signed)
Ms. Palau , Thank you for taking time to complete your Medicare Wellness Visit. I appreciate your ongoing commitment to your health goals. Please review the following plan we discussed and let me know if I can assist you in the future.   Screening recommendations/referrals: Colonoscopy: no longer required  Mammogram: due, last completed 08/19/17, ordered today someone will call to schedule an appointment  Bone Density: due, last completed 04/02/19, ordered today someone will call to schedule an appointment  Recommended yearly ophthalmology/optometry visit for glaucoma screening and checkup Recommended yearly dental visit for hygiene and checkup  Vaccinations: Influenza vaccine: Due-May obtain vaccine at our office or your local pharmacy. Pneumococcal vaccine: up to date  Tdap vaccine: up to date, completed 04/17/14, due 04/17/24 Shingles vaccine: Discuss with your local pharmacy   Covid-19:newest booster available at your local pharmacy   Advanced directives: Please bring a copy of Living Will and/or [place] for your chart.   Conditions/risks identified: see problem list   Next appointment: Follow up in one year for your annual wellness visit 03/30/22 @ 12:00pm, this will be a telephone visit.    Preventive Care 29 Years and Older, Female Preventive care refers to lifestyle choices and visits with your health care provider that can promote health and wellness. What does preventive care include? A yearly physical exam. This is also called an annual well check. Dental exams once or twice a year. Routine eye exams. Ask your health care provider how often you should have your eyes checked. Personal lifestyle choices, including: Daily care of your teeth and gums. Regular physical activity. Eating a healthy diet. Avoiding tobacco and drug use. Limiting alcohol use. Practicing safe sex. Taking low-dose aspirin every day. Taking vitamin and mineral supplements as  recommended by your health care provider. What happens during an annual well check? The services and screenings done by your health care provider during your annual well check will depend on your age, overall health, lifestyle risk factors, and family history of disease. Counseling  Your health care provider may ask you questions about your: Alcohol use. Tobacco use. Drug use. Emotional well-being. Home and relationship well-being. Sexual activity. Eating habits. History of falls. Memory and ability to understand (cognition). Work and work Statistician. Reproductive health. Screening  You may have the following tests or measurements: Height, weight, and BMI. Blood pressure. Lipid and cholesterol levels. These may be checked every 5 years, or more frequently if you are over 39 years old. Skin check. Lung cancer screening. You may have this screening every year starting at age 46 if you have a 30-pack-year history of smoking and currently smoke or have quit within the past 15 years. Fecal occult blood test (FOBT) of the stool. You may have this test every year starting at age 47. Flexible sigmoidoscopy or colonoscopy. You may have a sigmoidoscopy every 5 years or a colonoscopy every 10 years starting at age 47. Hepatitis C blood test. Hepatitis B blood test. Sexually transmitted disease (STD) testing. Diabetes screening. This is done by checking your blood sugar (glucose) after you have not eaten for a while (fasting). You may have this done every 1-3 years. Bone density scan. This is done to screen for osteoporosis. You may have this done starting at age 79. Mammogram. This may be done every 1-2 years. Talk to your health care provider about how often you should have regular mammograms. Talk with your health care provider about your test results, treatment options, and if necessary, the  need for more tests. Vaccines  Your health care provider may recommend certain vaccines, such  as: Influenza vaccine. This is recommended every year. Tetanus, diphtheria, and acellular pertussis (Tdap, Td) vaccine. You may need a Td booster every 10 years. Zoster vaccine. You may need this after age 14. Pneumococcal 13-valent conjugate (PCV13) vaccine. One dose is recommended after age 83. Pneumococcal polysaccharide (PPSV23) vaccine. One dose is recommended after age 23. Talk to your health care provider about which screenings and vaccines you need and how often you need them. This information is not intended to replace advice given to you by your health care provider. Make sure you discuss any questions you have with your health care provider. Document Released: 03/07/2015 Document Revised: 10/29/2015 Document Reviewed: 12/10/2014 Elsevier Interactive Patient Education  2017 [employer] Prevention in the Home Falls can cause injuries. They can happen to people of all ages. There are many things you can do to make your home safe and to help prevent falls. What can I do on the outside of my home? Regularly fix the edges of walkways and driveways and fix any cracks. Remove anything that might make you trip as you walk through a door, such as a raised step or threshold. Trim any bushes or trees on the path to your home. Use bright outdoor lighting. Clear any walking paths of anything that might make someone trip, such as rocks or tools. Regularly check to see if handrails are loose or broken. Make sure that both sides of any steps have handrails. Any raised decks and porches should have guardrails on the edges. Have any leaves, snow, or ice cleared regularly. Use sand or salt on walking paths during winter. Clean up any spills in your garage right away. This includes oil or grease spills. What can I do in the bathroom? Use night lights. Install grab bars by the toilet and in the tub and shower. Do not use towel bars as grab bars. Use non-skid mats or decals in the tub or  shower. If you need to sit down in the shower, use a plastic, non-slip stool. Keep the floor dry. Clean up any water that spills on the floor as soon as it happens. Remove soap buildup in the tub or shower regularly. Attach bath mats securely with double-sided non-slip rug tape. Do not have throw rugs and other things on the floor that can make you trip. What can I do in the bedroom? Use night lights. Make sure that you have a light by your bed that is easy to reach. Do not use any sheets or blankets that are too big for your bed. They should not hang down onto the floor. Have a firm chair that has side arms. You can use this for support while you get dressed. Do not have throw rugs and other things on the floor that can make you trip. What can I do in the kitchen? Clean up any spills right away. Avoid walking on wet floors. Keep items that you use a lot in easy-to-reach places. If you need to reach something above you, use a strong step stool that has a grab bar. Keep electrical cords out of the way. Do not use floor polish or wax that makes floors slippery. If you must use wax, use non-skid floor wax. Do not have throw rugs and other things on the floor that can make you trip. What can I do with my stairs? Do not leave any items on the  stairs. Make sure that there are handrails on both sides of the stairs and use them. Fix handrails that are broken or loose. Make sure that handrails are as long as the stairways. Check any carpeting to make sure that it is firmly attached to the stairs. Fix any carpet that is loose or worn. Avoid having throw rugs at the top or bottom of the stairs. If you do have throw rugs, attach them to the floor with carpet tape. Make sure that you have a light switch at the top of the stairs and the bottom of the stairs. If you do not have them, ask someone to add them for you. What else can I do to help prevent falls? Wear shoes that: Do not have high heels. Have  rubber bottoms. Are comfortable and fit you well. Are closed at the toe. Do not wear sandals. If you use a stepladder: Make sure that it is fully opened. Do not climb a closed stepladder. Make sure that both sides of the stepladder are locked into place. Ask someone to hold it for you, if possible. Clearly mark and make sure that you can see: Any grab bars or handrails. First and last steps. Where the edge of each step is. Use tools that help you move around (mobility aids) if they are needed. These include: Canes. Walkers. Scooters. Crutches. Turn on the lights when you go into a dark area. Replace any light bulbs as soon as they burn out. Set up your furniture so you have a clear path. Avoid moving your furniture around. If any of your floors are uneven, fix them. If there are any pets around you, be aware of where they are. Review your medicines with your doctor. Some medicines can make you feel dizzy. This can increase your chance of falling. Ask your doctor what other things that you can do to help prevent falls. This information is not intended to replace advice given to you by your health care provider. Make sure you discuss any questions you have with your health care provider. Document Released: 12/05/2008 Document Revised: 07/17/2015 Document Reviewed: 03/15/2014 Elsevier Interactive Patient Education  2017 [employer].

## 2021-04-03 ENCOUNTER — Other Ambulatory Visit: Payer: Self-pay | Admitting: Family Medicine

## 2021-04-03 NOTE — Telephone Encounter (Signed)
Pt is due for her part 2 CPE, please schedule and then route back to me to refill med

## 2021-04-07 NOTE — Telephone Encounter (Signed)
LMTCB to schedule appts

## 2021-04-10 NOTE — Telephone Encounter (Signed)
2nd attempt  LMTCB to schedule

## 2021-04-21 ENCOUNTER — Other Ambulatory Visit: Payer: Self-pay | Admitting: Family Medicine

## 2021-05-03 ENCOUNTER — Other Ambulatory Visit: Payer: Self-pay | Admitting: Family Medicine

## 2021-05-08 ENCOUNTER — Ambulatory Visit
Admission: RE | Admit: 2021-05-08 | Discharge: 2021-05-08 | Disposition: A | Discharge status: Home / Self Care | Payer: Medicare HMO | Source: Ambulatory Visit | Attending: Family Medicine | Admitting: Family Medicine

## 2021-05-08 DIAGNOSIS — Z1231 Encounter for screening mammogram for malignant neoplasm of breast: Secondary | ICD-10-CM | POA: Diagnosis not present

## 2021-05-11 ENCOUNTER — Telehealth: Payer: Self-pay | Admitting: Family Medicine

## 2021-05-11 NOTE — Chronic Care Management (AMB) (Signed)
?  Chronic Care Management  ? ?Outreach Note ? ?05/11/2021 ?Name: Shereta Crothers MRN: [id number] DOB: [birth year] ? ?Referred by: Tower, [name], MD ?Reason for referral : No chief complaint on file. ? ? ?An unsuccessful telephone outreach was attempted today. The patient was referred to the pharmacist for assistance with care management and care coordination.  ? ?Follow Up Plan:  ? ?Tatjana Dellinger ?Upstream Scheduler  ?

## 2021-05-13 ENCOUNTER — Telehealth: Payer: Self-pay | Admitting: Family Medicine

## 2021-05-13 NOTE — Chronic Care Management (AMB) (Signed)
?  Chronic Care Management  ? ?Note ? ?05/13/2021 ?Name: Loise Esguerra MRN: [id number] DOB: [birth year] ? ?[place] Zula Hovsepian is a 81 y.o. year old female who is a primary care patient of Tower, [name], MD. I reached out to [place] by phone today in response to a referral sent by Ms. Doristine Mango Gasbarro's PCP, Tower, [name], MD.  ? ?Ms. Chaudoin was given information about Chronic Care Management services today including:  ?CCM service includes personalized support from designated clinical staff supervised by her physician, including individualized plan of care and coordination with other care providers ?24/7 contact phone numbers for assistance for urgent and routine care needs. ?Service will only be billed when office clinical staff spend 20 minutes or more in a month to coordinate care. ?Only one practitioner may furnish and bill the service in a calendar month. ?The patient may stop CCM services at any time (effective at the end of the month) by phone call to the office staff. ? ?Patient agreed to services and verbal consent obtained.  ? ? ?Follow up plan: ? ? ?Tatjana Dellinger ?Upstream Scheduler  ?

## 2021-05-23 ENCOUNTER — Other Ambulatory Visit: Payer: Self-pay | Admitting: Family Medicine

## 2021-05-25 ENCOUNTER — Telehealth: Payer: Self-pay | Admitting: Hematology

## 2021-05-25 NOTE — Telephone Encounter (Signed)
Left message with pt with reschedule date. Left call back number if any changes are needed. ?

## 2021-06-11 ENCOUNTER — Telehealth: Payer: Self-pay

## 2021-06-11 ENCOUNTER — Ambulatory Visit: Payer: Medicare HMO | Admitting: Hematology

## 2021-06-11 ENCOUNTER — Other Ambulatory Visit: Payer: Medicare HMO

## 2021-06-11 NOTE — Chronic Care Management (AMB) (Signed)
? ? ?  Chronic Care Management ?Pharmacy Assistant  ? ?Name: [name]  MRN: [id number] DOB: 21-Feb-1941 ? ?[name] is an 81 y.o. year old female who presents for his initial CCM visit with the clinical pharmacist. ? ?Reason for Encounter: Initial Questions ?  ?Conditions to be addressed/monitored: ?CAD, HTN, and HLD ? ? ?Recent office visits:  ?None in 6 months ? ?Recent consult visits:  ?03/11/21-Gastroenterology-Daniel Jacobs,MD-surveillance colonoscopy procedure ?01/14/21-Gastroenterology-Daniel Jacobs,MD-discuss colonoscopy-hold eliquis 2 days ?12/25/20-[facility]- cough,congestion-chest xray,labs(normal) tessalon perles as needed and prednisone '20mg'$  take 1 tablet daily for 5 days,albuterol inhaler ? ?Hospital visits:  ?None in previous 6 months ? ?Medications: ?Outpatient Encounter Medications as of 06/11/2021  ?Medication Sig  ? apixaban (ELIQUIS) 5 MG TABS tablet Take 1 tablet (5 mg total) by mouth 2 (two) times daily.  ? Ascorbic Acid (VITAMIN C PO) Take by mouth.  ? b complex vitamins tablet Take 1 tablet by mouth daily.  ? benzonatate (TESSALON) 100 MG capsule Take 1 capsule (100 mg total) by mouth every 8 (eight) hours.  ? Cholecalciferol (VITAMIN D) 2000 units tablet Take 2,000 Units by mouth 3 (three) times a week. Takes 5000 units  ? ELIQUIS 5 MG TABS tablet TAKE 1 TABLET BY MOUTH TWICE A DAY  ? Multiple Vitamin (MULTIVITAMIN WITH MINERALS) TABS tablet Take 1 tablet by mouth daily.   ? Multiple Vitamins-Minerals (HAIR SKIN AND NAILS FORMULA PO) Take by mouth. (Patient not taking: Reported on 03/27/2021)  ? rosuvastatin (CRESTOR) 20 MG tablet TAKE 1 TABLET BY MOUTH EVERY DAY  ? Zinc Sulfate (ZINC 15 PO) Take by mouth. (Patient not taking: Reported on 03/27/2021)  ? ?No facility-administered encounter medications on file as of 06/11/2021.  ? ? ? ?Lab Results  ?Component Value Date/Time  ? HGBA1C 6.1 03/26/2020 02:36 PM  ? HGBA1C 6.1 02/20/2019 02:38 PM  ?  ? ?BP Readings from  Last 3 Encounters:  ?03/11/21 (!) 143/59  ?01/14/21 130/88  ?12/25/20 (!) 141/65  ? ? ?Unsuccessful attempt to reach patient. Left patient message reminding patient of appointment. ? ?Patient contacted to confirm telephone appointment with Charlene Brooke, Pharm D, on 06/16/21 at 1:30pm. ? ? ?Star Rating Drugs:  ?Medication:  Last Fill: Day Supply ?Rosuvastatin '20mg'$  05/25/21  90 ? ? ?Care Gaps: ?Annual wellness visit in last year? Yes ?Most Recent BP reading: 134/62 92-P  03/11/21 ? ? ?Charlene Brooke, CPP notified ? ?Verdell Kincannon, CCMA ?Health concierge  ?[phone]  ?

## 2021-06-16 ENCOUNTER — Telehealth: Payer: Medicare HMO

## 2021-06-16 ENCOUNTER — Telehealth: Payer: Self-pay | Admitting: Pharmacist

## 2021-06-16 DIAGNOSIS — E559 Vitamin D deficiency, unspecified: Secondary | ICD-10-CM

## 2021-06-16 DIAGNOSIS — R7303 Prediabetes: Secondary | ICD-10-CM

## 2021-06-16 DIAGNOSIS — E7800 Pure hypercholesterolemia, unspecified: Secondary | ICD-10-CM

## 2021-06-16 DIAGNOSIS — I10 Essential (primary) hypertension: Secondary | ICD-10-CM

## 2021-06-16 DIAGNOSIS — M810 Age-related osteoporosis without current pathological fracture: Secondary | ICD-10-CM

## 2021-06-16 NOTE — Progress Notes (Deleted)
Chronic Care Management Pharmacy Note  06/16/2021 Name:  [name] MRN:  [id number] DOB:  09/19/1940  Summary: CCM  ***  Recommendations/Changes made from today's visit: ***  Plan: ***   Subjective: [name] is an 81 y.o. year old female who is a primary patient of Tower, [name], MD.  The CCM team was consulted for assistance with disease management and care coordination needs.    Engaged with patient by telephone for initial visit in response to provider referral for pharmacy case management and/or care coordination services.   Consent to Services:  The patient was given the following information about Chronic Care Management services today, agreed to services, and gave verbal consent: 1. CCM service includes personalized support from designated clinical staff supervised by the primary care provider, including individualized plan of care and coordination with other care providers 2. 24/7 contact phone numbers for assistance for urgent and routine care needs. 3. Service will only be billed when office clinical staff spend 20 minutes or more in a month to coordinate care. 4. Only one practitioner may furnish and bill the service in a calendar month. 5.The patient may stop CCM services at any time (effective at the end of the month) by phone call to the office staff. 6. The patient will be responsible for cost sharing (co-pay) of up to 20% of the service fee (after annual deductible is met). Patient agreed to services and consent obtained.  Patient Care Team: Tower, [name], MD as PCP - General Yvonne [name], MD as Consulting Physician (Hematology) Yvonne Banister, MD as Attending Physician (Gastroenterology) Yvonne Klein, MD as Consulting Physician (General Surgery) Charlton Haws, [name] as Pharmacist (Pharmacist)  Recent office visits: 03/27/21 LPN McCain: AWV 05/24/68 Dr Glori Bickers OV: annual exam. Switched atorvastatin 40 mg to Rosuvastatin 20 mg (muscle  pain).  Recent consult visits: 03/11/21-Gastroenterology-Yvonne Jacobs,MD-surveillance colonoscopy procedure 01/14/21-Gastroenterology-Yvonne Jacobs,MD-discuss colonoscopy-hold eliquis 2 days 12/25/20-[facility]- cough,congestion-chest xray,labs(normal) tessalon perles as needed and prednisone 8m take 1 tablet daily for 5 days,albuterol inhaler  [name] visits: None in previous 6 months   Objective:  Lab Results  Component Value Date   CREATININE 0.87 12/25/2020   BUN 11 12/25/2020   GFR 47.59 (L) 03/26/2020   EGFR >60 02/21/2017   GFRNONAA >60 12/25/2020   GFRAA >60 05/03/2019   NA 141 12/25/2020   K 3.1 (L) 12/25/2020   CALCIUM 9.5 12/25/2020   CO2 27 12/25/2020   GLUCOSE 105 (H) 12/25/2020    Lab Results  Component Value Date/Time   HGBA1C 6.1 03/26/2020 02:36 PM   HGBA1C 6.1 02/20/2019 02:38 PM   GFR 47.59 (L) 03/26/2020 02:36 PM   GFR 62.56 02/20/2019 02:38 PM    Last diabetic Eye exam: No results found for: HMDIABEYEEXA  Last diabetic Foot exam: No results found for: HMDIABFOOTEX   Lab Results  Component Value Date   CHOL 137 03/26/2020   HDL 72.60 03/26/2020   LDLCALC 50 03/26/2020   LDLDIRECT 138.9 07/05/2012   TRIG 75.0 03/26/2020   CHOLHDL 2 03/26/2020       Latest Ref Rng & Units 06/11/2020   12:49 PM 03/26/2020    2:36 PM 12/12/2019   12:46 PM  Hepatic Function  Total Protein 6.5 - 8.1 g/dL 6.5   6.6   6.7    Albumin 3.5 - 5.0 g/dL 3.8   4.1   3.7    AST 15 - 41 U/L 21   16   19  ALT 0 - 44 U/L _0 Alk Phosphatase 38 - 126 U/L 74   71   77    Total Bilirubin 0.3 - 1.2 mg/dL 0.6   0.6   0.6      Lab Results  Component Value Date/Time   TSH 2.84 03/26/2020 02:36 PM   TSH 3.30 02/20/2019 02:38 PM       Latest Ref Rng & Units 12/25/2020   10:26 AM 06/11/2020   12:51 PM 03/26/2020    2:36 PM  CBC  WBC 4.0 - 10.5 K/uL 7.4   6.5   7.2    Hemoglobin 12.0 - 15.0 g/dL 12.6   12.2   12.7    Hematocrit 36.0 - 46.0 % 38.5    37.8   38.4    Platelets 150 - 400 K/uL 249   242   253.0      Lab Results  Component Value Date/Time   VD25OH 31.87 03/26/2020 02:36 PM   VD25OH 24.65 (L) 02/20/2019 02:38 PM    Clinical ASCVD: Yes  The ASCVD Risk score (Arnett DK, et al., 2019) failed to calculate for the following reasons:   The 2019 ASCVD risk score is only valid for ages 71 to 75       03/27/2021   12:29 PM 03/26/2020   11:35 AM 08/01/2017    3:43 PM  Depression screen PHQ 2/9  Decreased Interest 0 0 1  Down, Depressed, Hopeless 0 0 0  PHQ - 2 Score 0 0 1  Altered sleeping  0 2  Tired, decreased energy  0 0  Change in appetite  0 0  Feeling bad or failure about yourself   0 0  Trouble concentrating  0 0  Moving slowly or fidgety/restless  0 3  Suicidal thoughts  0 0  PHQ-9 Score  0 6  Difficult doing work/chores  Not difficult at all       Social History   Tobacco Use  Smoking Status Never  Smokeless Tobacco Never   BP Readings from Last 3 Encounters:  03/11/21 (!) 143/59  01/14/21 130/88  12/25/20 (!) 141/65   Pulse Readings from Last 3 Encounters:  03/11/21 66  01/14/21 88  12/25/20 65   Wt Readings from Last 3 Encounters:  03/27/21 150 lb (68 kg)  03/11/21 156 lb (70.8 kg)  01/14/21 156 lb (70.8 kg)   BMI Readings from Last 3 Encounters:  03/27/21 26.57 kg/m  03/11/21 23.04 kg/m  01/14/21 27.63 kg/m    Assessment/Interventions: Review of patient past medical history, allergies, medications, health status, including review of consultants reports, laboratory and other test data, was performed as part of comprehensive evaluation and provision of chronic care management services.   SDOH:  (Social Determinants of Health) assessments and interventions performed: {yes/no:20286}  SDOH Screenings   Alcohol Screen: Low Risk    Last Alcohol Screening Score (AUDIT): 0  Depression (PHQ2-9): Low Risk    PHQ-2 Score: 0  Financial Resource Strain: Low Risk    Difficulty of Paying Living  Expenses: Not very hard  Food Insecurity: No Food Insecurity   Worried About Charity fundraiser in the Last Year: Never true   Ran Out of Food in the Last Year: Never true  Housing: Low Risk    Last Housing Risk Score: 0  Physical Activity: Unknown   Days of Exercise per Week: 7 days   Minutes of Exercise per Session: Not  on file  Social Connections: Socially Isolated   Frequency of Communication with Friends and Family: More than three times a week   Frequency of Social Gatherings with Friends and Family: More than three times a week   Attends Religious Services: Never   Marine scientist or Organizations: No   Attends Archivist Meetings: Never   Marital Status: Widowed  Stress: No Stress Concern Present   Feeling of Stress : Only a little  Tobacco Use: Low Risk    Smoking Tobacco Use: Never   Smokeless Tobacco Use: Never   Passive Exposure: Not on file  Transportation Needs: No Transportation Needs   Lack of Transportation (Medical): No   Lack of Transportation (Non-Medical): No    CCM Care Plan  Allergies  Allergen Reactions   Salmon [Fish Allergy] Anaphylaxis    This happens with "CANNED SALMON" only, if eaten uncooked   Ampicillin Nausea Only    Has patient had a PCN reaction causing immediate rash, facial/tongue/throat swelling, SOB or lightheadedness with hypotension: No Has patient had a PCN reaction causing severe rash involving mucus membranes or skin necrosis: No Has patient had a PCN reaction that required hospitalization: No Has patient had a PCN reaction occurring within the last 10 years: No If all of the above answers are "NO", then may proceed with Cephalosporin use.    Codeine Nausea And Vomiting   Flagyl [Metronidazole] Nausea And Vomiting    Patient was taking this along with Neomycin and could not differentiate which med triggered the nausea & vomiting   Meperidine Hcl Nausea And Vomiting   Neomycin Nausea And Vomiting    Patient was  taking this along with Flagyl and could not differentiate which med triggered the nausea & vomiting   Propoxyphene Hcl Nausea And Vomiting   Sulfa Antibiotics Hives   Tramadol Hcl Nausea And Vomiting    Medications Reviewed Today     Reviewed by Loma Messing, LPN (Licensed Practical Nurse) on 03/27/21 at [place] List Status: <None>   Medication Order Taking? Sig Documenting Provider Last Dose Status Informant  apixaban (ELIQUIS) 5 MG TABS tablet [id number] Yes Take 1 tablet (5 mg total) by mouth 2 (two) times daily. Yvonne [name], MD Taking Active   Ascorbic Acid (VITAMIN C PO) [id number] Yes Take by mouth. [provider] Taking Active   b complex vitamins tablet [id number] Yes Take 1 tablet by mouth daily. [provider] Taking Active Self  benzonatate (TESSALON) 100 MG capsule [id number] Yes Take 1 capsule (100 mg total) by mouth every 8 (eight) hours. Chase Picket, MD Taking Active   Cholecalciferol (VITAMIN D) 2000 units tablet [id number] Yes Take 2,000 Units by mouth 3 (three) times a week. Takes 5000 units [provider] Taking Active Self  ELIQUIS 5 MG TABS tablet [id number] Yes TAKE 1 TABLET BY MOUTH TWICE A Ramond Dial, MD Taking Active   Multiple Vitamin (MULTIVITAMIN WITH MINERALS) TABS tablet [id number] Yes Take 1 tablet by mouth daily.  [provider] Taking Active Self  Multiple Vitamins-Minerals (HAIR SKIN AND NAILS FORMULA PO) [id number] No Take by mouth.  Patient not taking: Reported on 03/27/2021   [provider] Not Taking Active   rosuvastatin (CRESTOR) 20 MG tablet [id number] Yes Take 1 tablet (20 mg total) by mouth daily. Tower, [name], MD Taking Active   Zinc Sulfate (ZINC 15 PO) [id number] No Take by mouth.  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   Fall 10/28/2017   Abnormality of breast on screening mammography 08/01/2017   Genetic testing 04/25/2017   Adenopathy    History of DVT (deep vein thrombosis) 11/24/2016   Cancer of overlapping sites of colon ([place]) 09/23/2016   Estrogen deficiency 07/30/2016   Screening mammogram, encounter for 07/30/2016   Routine general medical examination at a health care facility 07/27/2016   H/O: CVA (cerebrovascular accident) 05/06/2015   HTN (hypertension) 05/06/2015   HLD (hyperlipidemia) 05/06/2015   GERD (gastroesophageal reflux disease) 05/06/2015   CAD (coronary artery disease) 05/06/2015   Seborrheic keratosis 07/05/2012   Herpes simplex 12/29/2010   Callus of foot 10/16/2010   Coronary artery disease, non-occlusive 10/16/2010   Insomnia 02/02/2010   HAND PAIN, BILATERAL 01/09/2010   Prediabetes 07/29/2009   ALLERGIC RHINITIS 12/04/2008   Vitamin D deficiency 04/30/2008   COLONIC POLYPS 06/28/2007   IBS 03/17/2007   INTERSTITIAL CYSTITIS 03/17/2007   Osteoporosis 03/17/2007    Immunization History  Administered Date(s) Administered   Fluad Quad(high Dose 65+) 12/04/2018, 11/15/2019   Influenza Split 12/29/2010, 11/18/2011   Influenza, High Dose Seasonal PF 12/29/2016, 01/14/2018   Influenza,inj,Quad PF,6+ Mos 03/02/2013   Influenza-Unspecified 11/22/2013, 11/23/2015   PFIZER(Purple Top)SARS-COV-2 Vaccination 06/02/2019, 06/23/2019, 03/07/2020   Pneumococcal Conjugate-13 07/28/2016   Pneumococcal Polysaccharide-23 04/30/2008   Td 04/30/2008, 04/17/2014   Zoster, Live 06/11/2010    Conditions to be addressed/monitored:  {USCCMDZASSESSMENTOPTIONS:23563}  There are no care plans that you recently modified to display for this patient.    Medication Assistance:  {MEDASSISTANCEINFO:25044}  Compliance/Adherence/Medication fill history: Care Gaps: ***  Star-Rating Drugs: ***  Patient's preferred pharmacy is:  [id number] [place] [address]Phone: [phone]: [phone] [place] [address] [place]Phone: [phone]: [phone] [place]Phone: [phone]: [phone] Uses pill box? {Yes or If no, why not?:20788} Pt endorses ***% compliance  We discussed: {Pharmacy options:24294} Patient decided to: {US Pharmacy Plan:23885}  Care Plan and Follow Up Patient Decision:  {FOLLOWUP:24991}  Plan: {CM FOLLOW UP PJSHF:02637} ***    Current Barriers:  {pharmacybarriers:24917}  Pharmacist Clinical Goal(s):  Patient will {PHARMACYGOALCHOICES:24921} through collaboration with PharmD and provider.   Interventions: 1:1 collaboration with Tower, MWynelle [name] MD regarding development and update of comprehensive plan of care as evidenced by provider attestation and co-signature Inter-disciplinary care team collaboration (see longitudinal plan of care) Comprehensive medication review performed; medication list updated in electronic medical record  {CCM PHARMD DISEASE STATES:25130}  Patient Goals/Self-Care Activities Patient will:  - {pharmacypatientgoals:24919}

## 2021-06-16 NOTE — Telephone Encounter (Signed)
Good question, I sent a flag to her oncologist.  He originally thought she had h/o afib but she doesn't.  If she still needs to be on it lifelong perhaps would need to change to warfarin during the donut hole time.   Unfortunate  ?Thanks for your help! ?

## 2021-06-16 NOTE — Telephone Encounter (Addendum)
Patient returned call. It has actually been over a year since her last PCP visit (04/02/20) so we cannot do a full CCM visit until she sees PCP. Scheduled her overdue physical for 06/24/21 @ 11:30AM ? ?We did discuss Eliquis cost - she reports Eliquis is reasonable to pay for until the donut hole, which she usually enters around August each year. Discussed assistance - based on her reported she will not qualify for manufacturer assistance.There are no other programs I am aware of that will help with Eliquis cost in Medicare population, unfortunately. ? ?I did look into her history with Eliquis - per chart review it was started 11/2016 due to acute L leg DVT following hemicolectomy complicated by post-op ileus. Eliquis was continued > 6 months due to active colon cancer/hypercoaguable state. 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 Per most recent oncology notes (05/2020), pt is under surveillance for colon cancer now, over 4 years from last recurrence and very low risk for recurrence at this point, no plans for repeat CT scans unless she has symptoms. ? ?Per my review, I could not find any evidence of atrial fibrillation and she has not seen cardiology for any reason. She did have an ischemic stroke in 2017, but workup did not reveal Afib. ? ?Given cancer in remission, which removes hypercoaguable state, and lack of recurrent DVTs, and no evidence of Afib, it may be reasonable to discontinue Eliquis. Will discuss with PCP. ?

## 2021-06-16 NOTE — Telephone Encounter (Signed)
?  Chronic Care Management  ? ?Outreach Note ? ?06/16/2021 ?Name: [name] MRN: [id number] DOB: Jul 09, 1940 ? ?Referred by: Tower, [name], MD ? ?Patient had a phone appointment scheduled with clinical pharmacist today - Eliquis patient assistance. ? ?An unsuccessful telephone outreach was attempted today. The patient was referred to the pharmacist for assistance with medications, care management and care coordination.  ? ?Patient will NOT be penalized in any way for missing a CCM appointment. The no-show fee does not apply. ? ?If possible, a message was left to return call to: [phone] or to [facility]. ? ?Charlene Brooke, PharmD, BCACP ?Clinical Pharmacist ?[facility] ?[phone] ? ? ?

## 2021-06-17 NOTE — Addendum Note (Signed)
Addended by: Loura Pardon A on: 06/17/2021 07:58 PM ? ? Modules accepted: Orders ? ?

## 2021-06-17 NOTE — Telephone Encounter (Signed)
I would like labs prior if she is able ?Orders are in  ?Thanks for your help ?

## 2021-06-17 NOTE — Telephone Encounter (Signed)
Attempted to contact patient to discuss, unable to reach, left voicemail. ?

## 2021-06-17 NOTE — Telephone Encounter (Signed)
It sounds ok to stop the eliquis  ?In light of past cva however I would like her to take 81 mg asa daily  ? ?I checked with Dr Burr Medico and this was the response ? ?[name],  ? ?Thanks for reaching out. She is almost 5 years out of her colon cancer diagnosis without evidence of recurrence. 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 So OK to stop liquids for DVT prophylaxis and if she does not need for AF.  ? ?Thanks  ? ?Krista Blue  ? ? ?

## 2021-06-18 NOTE — Telephone Encounter (Signed)
Left message for patient to schedule labs prior to physical. ?

## 2021-06-24 ENCOUNTER — Encounter: Payer: Self-pay | Admitting: Family Medicine

## 2021-06-24 ENCOUNTER — Ambulatory Visit (INDEPENDENT_AMBULATORY_CARE_PROVIDER_SITE_OTHER): Payer: Medicare HMO | Admitting: Family Medicine

## 2021-06-24 VITALS — BP 106/68 | HR 75 | Temp 97.6°F | Ht 63.5 in | Wt 152.5 lb

## 2021-06-24 DIAGNOSIS — W57XXXA Bitten or stung by nonvenomous insect and other nonvenomous arthropods, initial encounter: Secondary | ICD-10-CM | POA: Diagnosis not present

## 2021-06-24 DIAGNOSIS — I2510 Atherosclerotic heart disease of native coronary artery without angina pectoris: Secondary | ICD-10-CM

## 2021-06-24 DIAGNOSIS — E2839 Other primary ovarian failure: Secondary | ICD-10-CM

## 2021-06-24 DIAGNOSIS — E559 Vitamin D deficiency, unspecified: Secondary | ICD-10-CM

## 2021-06-24 DIAGNOSIS — E7800 Pure hypercholesterolemia, unspecified: Secondary | ICD-10-CM | POA: Diagnosis not present

## 2021-06-24 DIAGNOSIS — I10 Essential (primary) hypertension: Secondary | ICD-10-CM

## 2021-06-24 DIAGNOSIS — R7303 Prediabetes: Secondary | ICD-10-CM | POA: Diagnosis not present

## 2021-06-24 DIAGNOSIS — Z0000 Encounter for general adult medical examination without abnormal findings: Secondary | ICD-10-CM

## 2021-06-24 DIAGNOSIS — Z0001 Encounter for general adult medical examination with abnormal findings: Secondary | ICD-10-CM

## 2021-06-24 DIAGNOSIS — S20369A Insect bite (nonvenomous) of unspecified front wall of thorax, initial encounter: Secondary | ICD-10-CM | POA: Insufficient documentation

## 2021-06-24 DIAGNOSIS — M810 Age-related osteoporosis without current pathological fracture: Secondary | ICD-10-CM | POA: Diagnosis not present

## 2021-06-24 DIAGNOSIS — R002 Palpitations: Secondary | ICD-10-CM | POA: Insufficient documentation

## 2021-06-24 DIAGNOSIS — D689 Coagulation defect, unspecified: Secondary | ICD-10-CM

## 2021-06-24 LAB — COMPREHENSIVE METABOLIC PANEL
ALT: 19 U/L (ref 0–35)
AST: 19 U/L (ref 0–37)
Albumin: 4.3 g/dL (ref 3.5–5.2)
Alkaline Phosphatase: 65 U/L (ref 39–117)
BUN: 31 mg/dL — ABNORMAL HIGH (ref 6–23)
CO2: 31 mEq/L (ref 19–32)
Calcium: 10.1 mg/dL (ref 8.4–10.5)
Chloride: 103 mEq/L (ref 96–112)
Creatinine, Ser: 1.22 mg/dL — ABNORMAL HIGH (ref 0.40–1.20)
GFR: 41.21 mL/min — ABNORMAL LOW (ref >60.00)
Glucose, Bld: 119 mg/dL — ABNORMAL HIGH (ref 70–99)
Potassium: 4.1 mEq/L (ref 3.5–5.1)
Sodium: 141 mEq/L (ref 135–145)
Total Bilirubin: 0.6 mg/dL (ref 0.2–1.2)
Total Protein: 6.9 g/dL (ref 6.0–8.3)

## 2021-06-24 LAB — CBC WITH DIFFERENTIAL/PLATELET
Basophils Absolute: 0 10*3/uL (ref 0.0–0.1)
Basophils Relative: 0.6 % (ref 0.0–3.0)
Eosinophils Absolute: 0.1 10*3/uL (ref 0.0–0.7)
Eosinophils Relative: 1.1 % (ref 0.0–5.0)
HCT: 39.3 % (ref 36.0–46.0)
Hemoglobin: 13 g/dL (ref 12.0–15.0)
Lymphocytes Relative: 19.6 % (ref 12.0–46.0)
Lymphs Abs: 1.6 10*3/uL (ref 0.7–4.0)
MCHC: 33.1 g/dL (ref 30.0–36.0)
MCV: 91.6 fl (ref 78.0–100.0)
Monocytes Absolute: 0.9 10*3/uL (ref 0.1–1.0)
Monocytes Relative: 11 % (ref 3.0–12.0)
Neutro Abs: 5.4 10*3/uL (ref 1.4–7.7)
Neutrophils Relative %: 67.7 % (ref 43.0–77.0)
Platelets: 243 10*3/uL (ref 150.0–400.0)
RBC: 4.29 Mil/uL (ref 3.87–5.11)
RDW: 13.8 % (ref 11.5–15.5)
WBC: 8 10*3/uL (ref 4.0–10.5)

## 2021-06-24 LAB — HEMOGLOBIN A1C: Hgb A1c MFr Bld: 5.9 % (ref 4.6–6.5)

## 2021-06-24 LAB — TSH: TSH: 3.98 u[IU]/mL (ref 0.35–5.50)

## 2021-06-24 LAB — LIPID PANEL
Cholesterol: 163 mg/dL (ref 0–200)
HDL: 74.1 mg/dL (ref >39.00)
LDL Cholesterol: 50 mg/dL (ref 0–99)
NonHDL: 89.11
Total CHOL/HDL Ratio: 2
Triglycerides: 197 mg/dL — ABNORMAL HIGH (ref 0.0–149.0)
VLDL: 39.4 mg/dL (ref 0.0–40.0)

## 2021-06-24 LAB — VITAMIN D 25 HYDROXY (VIT D DEFICIENCY, FRACTURES): VITD: 41.67 ng/mL (ref 30.00–100.00)

## 2021-06-24 MED ORDER — DOXYCYCLINE HYCLATE 100 MG PO TABS: 100.0000 mg | ORAL_TABLET | Freq: Two times a day (BID) | ORAL | 0 refills | Status: DC

## 2021-06-24 NOTE — Assessment & Plan Note (Signed)
Reviewed health habits including diet and exercise and skin cancer prevention ?Reviewed appropriate screening tests for age  ?Also reviewed health mt list, fam hx and immunization status , as well as social and family history   ?See HPI ?Labs ordered ?dexa ordered (no fractures, takes ca and D) good exercise  ?Declines shigrix for now  ?Mammogram utd 04/2021, colonscopy utd from January (personal h/o colon cancer) ? ?

## 2021-06-24 NOTE — Assessment & Plan Note (Signed)
A1c ordered disc imp of low glycemic diet and wt loss to prevent DM2  

## 2021-06-24 NOTE — Assessment & Plan Note (Signed)
Pt is interested in cardiology referral  ?This is intermittent ?Past h/o cva (? [place]) ? ?Concerned about poss a fib ?Also has CAD ?EKG NSR with rate variation 77   Today  ?Referral done  ?

## 2021-06-24 NOTE — Assessment & Plan Note (Signed)
D level ordered  ?Taking ca plus D ?Disc imp to bone and overall health  ?

## 2021-06-24 NOTE — Progress Notes (Signed)
? ?Subjective:  ? ? Patient ID: [name], female    DOB: 13-Sep-1940, 81 y.o.   MRN: [id number] ? ?HPI ?Here for health maintenance exam and to review chronic medical problems   ? ?Wt Readings from Last 3 Encounters:  ?06/24/21 152 lb 8 oz (69.2 kg)  ?03/27/21 150 lb (68 kg)  ?03/11/21 156 lb (70.8 kg)  ? ?26.59 kg/m? ? ?Has a tick bite on chest- is itchy and a little red  ?Very tiny tick she got off  ?Feels fine -no fever  ?Outside a lot  ? ?Got kicked by horse on side of L knee several months ago , knocked her down  ?Her R hip area hurts  ?? Pinched nerve  ?It bothers her when she is in bed (tylenol helps)  ? ? ?Had amw 03/27/21-reviewed  ? ?Immunization History  ?Administered Date(s) Administered  ? Fluad Quad(high Dose 65+) 12/04/2018, 11/15/2019  ? Influenza Split 12/29/2010, 11/18/2011  ? Influenza, High Dose Seasonal PF 12/29/2016, 01/14/2018  ? Influenza,inj,Quad PF,6+ Mos 03/02/2013  ? Influenza-Unspecified 11/22/2013, 11/23/2015  ? PFIZER(Purple Top)SARS-COV-2 Vaccination 06/02/2019, 06/23/2019, 03/07/2020  ? Pneumococcal Conjugate-13 07/28/2016  ? Pneumococcal Polysaccharide-23 04/30/2008  ? Td 04/30/2008, 04/17/2014  ? Zoster, Live 06/11/2010  ? ?Zoster status :unsure if she wants shingrix  ? 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 ?Mammogram 04/2021  ?Self breast exam: no lumps  ? ?Colonoscopy 02/2021 - f/u for colon cancer  ? ?Dexa 03/2019 ?OP ?Falls (knocked over once by horse)  was able to get up and keep working  ?Fractures: no fracture  ?Supplements : ca and D twice daily  ? ? ? ?HTN with h/o CAD ?bp is stable today -now on the low side, she gets dizzy at times  ?No cp or palpitations or headaches or edema  ?No medications currently  ?BP Readings from Last 3 Encounters:  ?06/24/21 106/68  ?03/11/21 (!) 143/59  ?01/14/21 130/88  ?  ?Was on eliquis for DVT (when she had cancer)  ? ?Per Dr Burr Medico recently: ? ?[name],  ? ?Thanks for reaching out. She is almost 5  years out of her colon cancer diagnosis without evidence of recurrence. 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 So OK to stop liquids for DVT prophylaxis and if she does not need for AF.  ? ?Thanks  ? ?Krista Blue  ? ? ?Pt has a h/o stroke and feels uncomfortable coming off the eliquis ?She is worried about this  ?No h/o a fib but feels like she has had palpitations in the past  ?Would like to discuss with cardiology ?At times feels like heart races  ? ? ? ?Lab Results  ?Component Value Date  ? CREATININE 0.87 12/25/2020  ? BUN 11 12/25/2020  ? NA 141 12/25/2020  ? K 3.1 (L) 12/25/2020  ? CL 105 12/25/2020  ? CO2 27 12/25/2020  ? ? ?Hyperlipidemia ?Lab Results  ?Component Value Date  ? CHOL 137 03/26/2020  ? HDL 72.60 03/26/2020  ? [place] 50 03/26/2020  ? LDLDIRECT 138.9 07/05/2012  ? TRIG 75.0 03/26/2020  ? CHOLHDL 2 03/26/2020  ? ? ?Prediabetes ?Lab Results  ?Component Value Date  ? HGBA1C 6.1 03/26/2020  ? ?Patient Active Problem List  ? Diagnosis Date Noted  ? Tick bite of chest wall 06/24/2021  ? Palpitations 06/24/2021  ? History of colon cancer 04/02/2020  ? Atherosclerosis of aorta ([place])  04/02/2020  ? 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 Fall 10/28/2017  ? Genetic testing 04/25/2017  ? History of DVT (deep vein thrombosis) 11/24/2016  ? Cancer of overlapping sites of colon ([place]) 09/23/2016  ? Estrogen deficiency 07/30/2016  ? Screening mammogram, encounter for 07/30/2016  ? Routine general medical examination at a health care facility 07/27/2016  ? H/O: CVA (cerebrovascular accident) 05/06/2015  ? HTN (hypertension) 05/06/2015  ? HLD (hyperlipidemia) 05/06/2015  ? GERD (gastroesophageal reflux disease) 05/06/2015  ? CAD (coronary artery disease) 05/06/2015  ? Seborrheic keratosis 07/05/2012  ? Herpes simplex 12/29/2010  ? Coronary artery disease, non-occlusive 10/16/2010  ? Insomnia 02/02/2010  ? HAND PAIN, BILATERAL 01/09/2010  ? Prediabetes  07/29/2009  ? ALLERGIC RHINITIS 12/04/2008  ? Vitamin D deficiency 04/30/2008  ? COLONIC POLYPS 06/28/2007  ? IBS 03/17/2007  ? INTERSTITIAL CYSTITIS 03/17/2007  ? Osteoporosis 03/17/2007  ? ?Past Medical History:  ?Diagnosis Date  ? Anemia   ? Arthritis   ? Astigmatism   ? both eyes  ? Cancer [facility])   ? colon surgery done last chemo tx 01-17-17  ? Cataract   ? Colon cancer ([place])   ? Coronary artery disease, non-occlusive   ? Dyspnea   ? more so exertion  ? Dysrhythmia   ? skipped beats every now and then  ? GERD (gastroesophageal reflux disease)   ? Glaucoma   ? both eyes right eye worse than left  ? Hammer toes of both feet   ? Headache   ? migraines with aura  ? Heel spur   ? both heels and front  ? HSV (herpes simplex virus) infection   ? Hyperglycemia   ? denies  ? Hyperlipidemia   ? Hypertension   ? IBS (irritable bowel syndrome)   ? IC (interstitial cystitis)   ? Insomnia disorder related to known organic factor   ? Osteoporosis   ? PONV (postoperative nausea and vomiting)   ? has had more surgeries, and had no problems  ? Stroke [facility]) 2016  ? Vitamin D deficiency   ? ?Past Surgical History:  ?Procedure Laterality Date  ? ABDOMINAL HYSTERECTOMY    ? 1 ovary and womb removed  ? CARDIAC CATHETERIZATION  7/12  ? non obst dz -- not sure what yr  ? CHOLECYSTECTOMY    ? EUS N/A 02/03/2017  ? Procedure: UPPER ENDOSCOPIC ULTRASOUND (EUS) LINEAR;  Surgeon: [name], MD;  Location: [facility];  Service: Endoscopy;  Laterality: N/A;  ? EYE SURGERY Bilateral   ? ioc for cataracts   ? LAPAROSCOPIC RIGHT COLECTOMY Right 11/10/2016  ? Procedure: LAPAROSCOPIC EXTENDED RIGHT COLECTOMY;  Surgeon: [name], MD;  Location: [place];  Service: General;  Laterality: Right;  ? PORT-A-CATH REMOVAL Left 11/21/2019  ? Procedure: REMOVAL PORT-A-CATH;  Surgeon: [name], MD;  Location: [place];  Service: General;  Laterality: Left;  ? PORTACATH PLACEMENT Left 12/22/2016  ? Procedure: INSERTION  PORT-A-CATH;  Surgeon: [name], MD;  Location: [place];  Service: General;  Laterality: Left;  ? ROTATOR CUFF REPAIR Bilateral   ? ?Social History  ? ?Tobacco Use  ? Smoking status: Never  ? Smokeless tobacco: Never  ?Vaping Use  ? Vaping Use: Never used  ?Substance Use Topics  ? Alcohol use: No  ?  Alcohol/week: 0.0 standard drinks  ? Drug use: No  ? ?Family History  ?Problem Relation Age of Onset  ? Alcohol abuse Mother   ? Heart attack  Mother   ? GER disease Mother   ? GER disease Father   ? Alcohol abuse Father   ? Stroke Father   ? Tuberculosis Father   ? Heart attack Father   ? Aneurysm Father   ? Mental illness Sister   ? Depression Sister   ? Breast cancer Neg Hx   ? ?Allergies  ?Allergen Reactions  ? Dani Gobble [Fish Allergy] Anaphylaxis  ?  This happens with "CANNED SALMON" only, if eaten uncooked  ? Ampicillin Nausea Only  ?  Has patient had a PCN reaction causing immediate rash, facial/tongue/throat swelling, SOB or lightheadedness with hypotension: No ?Has patient had a PCN reaction causing severe rash involving mucus membranes or skin necrosis: No ?Has patient had a PCN reaction that required hospitalization: No ?Has patient had a PCN reaction occurring within the last 10 years: No ?If all of the above answers are "NO", then may proceed with Cephalosporin use. ?  ? Codeine Nausea And Vomiting  ? Flagyl [Metronidazole] Nausea And Vomiting  ?  Patient was taking this along with Neomycin and could not differentiate which med triggered the nausea & vomiting  ? Meperidine Hcl Nausea And Vomiting  ? Neomycin Nausea And Vomiting  ?  Patient was taking this along with Flagyl and could not differentiate which med triggered the nausea & vomiting  ? Propoxyphene Hcl Nausea And Vomiting  ? Sulfa Antibiotics Hives  ? Tramadol Hcl Nausea And Vomiting  ? ?Current Outpatient Medications on File Prior to Visit  ?Medication Sig Dispense Refill  ? apixaban (ELIQUIS) 5 MG TABS tablet Take 1 tablet (5  mg total) by mouth 2 (two) times daily. 120 tablet 1  ? b complex vitamins tablet Take 1 tablet by mouth daily.    ? Calcium Carb-Cholecalciferol (CALCIUM 600 + D PO) Take 1 capsule by mouth in the morning and at bedtime

## 2021-06-24 NOTE — Assessment & Plan Note (Signed)
Disc goals for lipids and reasons to control them ?Rev last labs with pt ?Rev low sat fat diet in detail ? ?crestor 20 mg daily  ?Good diet  ? ?Lab today ?

## 2021-06-24 NOTE — Assessment & Plan Note (Signed)
Clotting has been in setting of cancer ?Now cancer free-Dr Burr Medico feels comfortable stopping eliquis  ? ?Pt is worried about stroke risk and plans to see cardiology first (remote h/o cva) ?

## 2021-06-24 NOTE — Assessment & Plan Note (Signed)
Pt is interested in a f/u with cardiology  ?Has been years  ?occ palpitations  ? ?

## 2021-06-24 NOTE — Patient Instructions (Addendum)
Try to work on fluid intake  ?Aim for 64 oz of fluids daily  ? ?Take the doxycycline as directed for the tick bite ?If you get a fever or rash or feel ill let us know asap ?Use over the counter cortisone cream on tick bite for itch  ? ? ? ?Let's set you up with a cardiology visit  ?You will get a call  ? ?You are due for a bone density test  ?Call the breast center to schedule it  ? ?Please call the location of your choice from the menu below to schedule your Mammogram and/or Bone Density appointment.   ? ?  ? ?[facility]                ?      Phone:  [phone] ?[address]                               ?[place], [place] [address]                                                             ?Services: Traditional and 3D Mammogram, Bone Density  ? ?[facility]           ?      Phone: [phone] ?[address]                                                       ?[place], [place] [address]    ?Service: Bone Density ONLY  ? *this site does NOT perform mammograms ? ?[facility]                       ? Phone:  [phone] ?[address]                                  ?[place], [place] [address]                                            ?Services:  3D Mammogram and Bone Density  ? ? ?[place] ? ?[place] [facility]   ?Phone:  [phone]   ?[place], [place] [address]                                            ?  Services: 3D Mammogram and Bone Density ? ?[place] at [facility])  ?Phone:  [phone]   ?[address]                        ?[place],  [address]                                              ?Services:  3D Mammogram and Bone Density ?  ? ? ? ? ? ? ?

## 2021-06-24 NOTE — Assessment & Plan Note (Signed)
Due for dexa ?Order done, pt will call to schedule  ?One fall/no fractures ?Takes ca and D ?

## 2021-06-24 NOTE — Assessment & Plan Note (Signed)
No meds currently and bp is lower  ?BP: 106/68  ? ?Encouraged good hydration  ?

## 2021-06-24 NOTE — Assessment & Plan Note (Signed)
There is some discoloration in a ring around itchy spot ?No tick parts remain  ?Disc care with soap/water and cortisone topical  ?Px docycycline 100 mg to emp cover tick bourne illness ? ?Watch closely for rash, headache , fever or other symptoms  ?

## 2021-06-25 ENCOUNTER — Telehealth: Payer: Self-pay | Admitting: *Deleted

## 2021-06-25 NOTE — Telephone Encounter (Signed)
Left VM requesting pt to call the office back 

## 2021-06-25 NOTE — Telephone Encounter (Signed)
-----   Message from Abner Greenspan, MD sent at 06/24/2021  8:09 PM EDT ----- ?Kidney numbers are up a bit -this could be from dehydration  ?Please increase fluids to 64 oz daily (mostly water)  ?Re check bmet in 2 wk with urinalysis  ? ?Other labs are stale ?Cholesterol is well controlled  ?

## 2021-06-29 ENCOUNTER — Telehealth: Payer: Self-pay

## 2021-06-29 NOTE — Telephone Encounter (Signed)
Lvm for patient to call us back regarding lab results ?

## 2021-06-29 NOTE — Telephone Encounter (Signed)
-----   Message from Abner Greenspan, MD sent at 06/24/2021  8:09 PM EDT ----- ?Kidney numbers are up a bit -this could be from dehydration  ?Please increase fluids to 64 oz daily (mostly water)  ?Re check bmet in 2 wk with urinalysis  ? ?Other labs are stale ?Cholesterol is well controlled  ?

## 2021-07-01 ENCOUNTER — Other Ambulatory Visit: Payer: Self-pay | Admitting: Hematology

## 2021-07-02 ENCOUNTER — Encounter: Payer: Self-pay | Admitting: *Deleted

## 2021-07-02 NOTE — Telephone Encounter (Signed)
See result notes. 

## 2021-07-03 ENCOUNTER — Inpatient Hospital Stay: Payer: Medicare HMO | Attending: Family Medicine

## 2021-07-03 ENCOUNTER — Inpatient Hospital Stay: Payer: Medicare HMO | Admitting: Hematology

## 2021-07-21 ENCOUNTER — Telehealth: Payer: Self-pay | Admitting: Family Medicine

## 2021-07-21 DIAGNOSIS — R944 Abnormal results of kidney function studies: Secondary | ICD-10-CM | POA: Insufficient documentation

## 2021-07-21 NOTE — Telephone Encounter (Signed)
-----   Message from Ellamae Sia sent at 07/15/2021 11:22 AM EDT ----- Regarding: Lab orders for Wednesday, 5.31.23 Lab orders, thanks

## 2021-07-22 ENCOUNTER — Other Ambulatory Visit (INDEPENDENT_AMBULATORY_CARE_PROVIDER_SITE_OTHER): Payer: Medicare HMO

## 2021-07-22 DIAGNOSIS — R944 Abnormal results of kidney function studies: Secondary | ICD-10-CM | POA: Diagnosis not present

## 2021-07-22 LAB — BASIC METABOLIC PANEL
BUN: 30 mg/dL — ABNORMAL HIGH (ref 6–23)
CO2: 28 mEq/L (ref 19–32)
Calcium: 9.8 mg/dL (ref 8.4–10.5)
Chloride: 102 mEq/L (ref 96–112)
Creatinine, Ser: 1.19 mg/dL (ref 0.40–1.20)
GFR: 42.43 mL/min — ABNORMAL LOW (ref >60.00)
Glucose, Bld: 72 mg/dL (ref 70–99)
Potassium: 3.9 mEq/L (ref 3.5–5.1)
Sodium: 138 mEq/L (ref 135–145)

## 2021-07-22 LAB — POC URINALSYSI DIPSTICK (AUTOMATED)
Bilirubin, UA: NEGATIVE
Blood, UA: NEGATIVE
Glucose, UA: NEGATIVE
Ketones, UA: NEGATIVE
Leukocytes, UA: NEGATIVE
Nitrite, UA: NEGATIVE
Protein, UA: NEGATIVE
Spec Grav, UA: 1.025 (ref 1.010–1.025)
Urobilinogen, UA: 0.2 E.U./dL
pH, UA: 5.5 (ref 5.0–8.0)

## 2021-07-23 ENCOUNTER — Telehealth: Payer: Self-pay | Admitting: *Deleted

## 2021-07-23 NOTE — Telephone Encounter (Signed)
Left VM requesting pt to call the office back 

## 2021-07-23 NOTE — Telephone Encounter (Signed)
-----   Message from Abner Greenspan, MD sent at 07/22/2021  8:42 PM EDT ----- Kidney numbers are slightly improved Keep drinking fluids  Avoid nsaids  Urinalysis is clear/normal

## 2021-07-27 ENCOUNTER — Telehealth: Payer: Self-pay

## 2021-07-27 NOTE — Telephone Encounter (Signed)
-----   Message from Abner Greenspan, MD sent at 07/22/2021  8:42 PM EDT ----- Kidney numbers are slightly improved Keep drinking fluids  Avoid nsaids  Urinalysis is clear/normal

## 2021-07-27 NOTE — Telephone Encounter (Signed)
I have attempted without success to contact this patient by phone to discuss lab results and I left a message on answering machine.

## 2021-08-10 ENCOUNTER — Telehealth: Payer: Self-pay | Admitting: Hematology

## 2021-08-10 NOTE — Telephone Encounter (Signed)
Left message with rescheduled upcoming appointment per provider's request.

## 2021-08-11 ENCOUNTER — Inpatient Hospital Stay: Payer: Medicare HMO

## 2021-08-11 ENCOUNTER — Inpatient Hospital Stay: Payer: Medicare HMO | Admitting: Hematology

## 2021-08-30 NOTE — Progress Notes (Unsigned)
[facility]   [phone] [phone]   Clinic Follow up Note   Patient Care Team: Tower, Marne A, MD as PCP - General , , MD as Consulting Physician (Hematology) Jacobs, Daniel P, MD as Attending Physician (Gastroenterology) Byerly, Faera, MD as Consulting Physician (General Surgery) Foltanski, Lindsey N, RPH as Pharmacist (Pharmacist)  Date of Service:  08/30/2021  CHIEF COMPLAINT: F/u of colon cancer   ASSESSMENT & PLAN:  [name] is a 81 y.o. female with    1. Synchronized colon adenocarcinoma, moderately differentiated, in hepatic flexure and distal transverse colon, pT3(m)N1cMx with indeterminate liver lesions, at least stage IIIB, MSI-H -She was diagnosed in 08/31/16. She is s/p right colectomy, adjuvant FOLFOX which was changed to Xeloda after cycle 6 due to poor tolerance.  -Initial staging scan showed 2 liver lesions, suspicious for liver metastasis, but not hypermetabolic on PET. The liver lesion has decreased in size after adjuvant chemotherapy. -She is currently on surveillance. Her last CT CAP from 06/11/2020 showed stable exam with no new or progressive findings to suggest recurrent or metastatic disease.    2. Genetics was negative for pathogenetic mutations.   3. Liver lesions  -Previous biopsy of the enlarged portal node was negative for metastasis, but also appear to shrink on chemotherapy based on repeat scans.  -Dr. Yamagada feels her liver lesions are difficult to biopsy, and overall stable. -Her last CT chest from 06/26/18 shows unremarkable upper abdomen. -05/03/19 CT CAP show liver lesions smaller, the smaller lesion is not seen on scan. stable on 06/10/20 CT CAP.    4. 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  -Continue to f/u with PCP    Plan:  -CT CAP reviewed,  NED -Colonoscopy in 09/2020 with Dr Jacobs  -Lab and F/u in 1 year.    SUMMARY OF ONCOLOGIC HISTORY: Oncology History Overview Note  Cancer Staging Cancer of overlapping sites of colon (HCC) Staging form: Colon and Rectum, AJCC 8th Edition - Pathologic stage from 11/10/2016: Stage IIIB (pT3(m), pN1c, cM0) - Signed by , , MD on 12/16/2016     Cancer of overlapping sites of colon (HCC)  08/31/2016 Tumor Marker   Patient's tumor was tested for the following markers: CEA. Results of the tumor marker test revealed 2.0.   08/31/2016 Pathology Results   Colon biopsy Diagnosis: 1. Surgical [P], hepatic flexure. ADENOCARCINOMA. Moderately differentiated. 2. Surgical [P], transverse. ADENOCARCINOMA, Moderately differentiated.   08/31/2016 Initial Diagnosis   Colon cancer metastasized to liver (HCC)   08/31/2016 Procedure   - 1) Polypoid mass at the hepatic flexure, 2cm, soft but clearly neoplastic.  - 2) Clearly malignant mass in the distal transverse colon; 4cm, firm, 1/2 cirumference, ulcerated. ' - 3) Four small (3-4mm) scattered typical appearing adenomas throughout the colon, notremoved since she mistakenly stayed on her plavix for this procedure.   09/01/2016 Imaging   CT Abdomen Pelvis W Contrast IMPRESSION: There are 2 colonic lesions involving the proximal ascending colon and proximal transverse colon, as detailed above, highly concerning for colonic neoplasm. In addition, there are at least 2 indeterminate liver lesions which are suspicious for potential metastatic disease.    09/16/2016 Imaging   Mr Liver W Wo Contrast IMPRESSION: Lesion within central right lobe of liver exhibits of peripheral enhancement and is suspicious for liver metastasis. Lesion within caudate lobe of liver identified on recent CT does   not have a corresponding signal or enhancement abnormality on today's study. Within the inferior right lobe of liver there is an enhancing structure which is favored to  represent an atypical benign hemangioma with liver metastasis felt less likely. The transverse colon lesion is again identified compatible with colonic adenocarcinoma.   10/04/2016 PET scan   PET 10/04/16 IMPRESSION: 1. Highly hypermetabolic proximal transverse colon mass, maximum SUV 30.8. Small focus of hypermetabolic activity in the mesentery just above this mass probably represents a local involved lymph node. 2. Moderately hypermetabolic ascending colon mass, maximum SUV 8.7. 3. Highly hypermetabolic enlarged portacaval lymph node, maximum SUV 16.7. 4. Focus of hypermetabolic activity near the duodenum bulb could be physiologic or due to an adjacent lymph node. 5. None of the liver lesions are discernibly hypermetabolic. Particularly the more cephalad right hepatic lobe lesion merits surveillance, it had nonspecific enhancement characteristics on prior cross-sectional imaging and only a thin enhancing wall which could conceivably predispose to false negative due to central necrosis. 6. Several tiny chronic pulmonary nodules are likely benign. 7. Other imaging findings of potential clinical significance: Aortic Atherosclerosis (ICD10-I70.0). Coronary atherosclerosis. Bilateral nonobstructive nephrolithiasis.   11/10/2016 Surgery   LAPAROSCOPIC EXTENDED RIGHT COLECTOMY by Dr. Byerly    11/10/2016 Pathology Results   Diagnosis 11/10/16 Colon, segmental resection for tumor, Ascending and Transverse ADENOCARCINOMA OF THE ASCENDING COLON WITH EXTRA CELLULAR MUCIN, GRADE 3, SIZE 3.4 CM THE TUMOR INVADES MUSCULARIS PROPRIA POORLY DIFFERENTIATED ADENOCARCINOMA OF THE TRANSVERSE COLON, GRADE 4, SIZE 6.1 CM THE TUMOR INVADES THROUGH MUSCULARIS PROPRIA INTO PERICOLONIC SOFT TISSUE EXTRAMURAL SATELLITE TUMOR NODULES IS IDENTIFIED (X1) ALL MARGINS OF RESECTION ARE NEGATIVE FOR CARCINOMA METASTATIC CARCINOMA IN ONE OF SIXTY LYMPH NODES (1/60) TABULAR ADENOMA X1   12/28/2016 Imaging   MRI  abdomen w/wo contrast IMPRESSION: Motion degraded images.   Two indeterminate right liver lesions measuring up to 1.6 cm, as described above. Continued attention on follow-up is suggested.   Two periportal lymph nodes measuring up to 1.9 cm, suspicious for nodal metastases.   Postsurgical changes related to right colon resection.     12/29/2016 - 04/11/2017 Chemotherapy   FOLFOX every 2 weeks starting 12/29/16 last cycle 7 on 04/11/17.    02/03/2017 Procedure   EUS Per Dr. Jacobs Endoscopic Finding Findings: The examined esophagus was endoscopically normal. The entire examined stomach was endoscopically normal. The examined duodenum was endoscopically normal. Endosonographic Finding (limited evaluation for tissue acquisition): 1. One enlarged lymph node (vs soft tissue mass) was visualized in the porta hepatis region. It measured 16 mm in maximal cross-sectional diameter. The node (vs soft tissue) was oval, isoechoic and had poorly defined margins. Fine needle aspiration for cytology was performed. Color Doppler imaging was utilized prior to needle puncture to confirm a lack of significant vascular structures within the needle path. Threepasses were made with the 25 gauge needle using a transgastric approach. 2. CBD was normal, non-dilated 3. Limited views of the liver, spleen, pancreas were all normal.  Impression: One periportal soft tissue mass (lymphnode?) measuring 16mm was noted and sampled with FNA.   02/03/2017 Pathology Results   Diagnosis FINE NEEDLE ASPIRATION, ENDOSCOPIC, PERI PORTAL NODE (SPECIMEN 1 OF 1 COLLECTED 02/03/17): NO MALIGNANT CELLS IDENTIFIED. SCANT LYMPHOID AND BENIGN GLANDULAR ELEMENTS.   03/09/2017 Imaging   IMPRESSION: Interval decrease in several tiny low-attenuation liver lesions the, consistent with improving hepatic metastases.   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      04/07/2017   Genetic Testing   The patient had genetic testing due to a personal history of colon cancer that was MSI-High and had IHC loss of MLH1 and PMS2.  TomorNext Lynch+ cancer Next was ordered throught he laboratory [facility].  TumorNext Lynch with CancerNext: Paired Germline and Tumor Analyses for EnhancedDiagnosis of Lynch Syndrome plus Analyses of 29 Additional Genes Associated with Hereditary Cancer.  Germline Genes analyzed: MLH1, MSH2, MSH6, PMS2, APC, ATM, BARD1, BMPR1A, BRIP1, CDH1, CDKN2A, CHEK2, DICER1, MRE11A, MUTYH, NBN, PALB2, PTEN, RAD50, RAD51C, RAD51D, SMAD4, STK11, TP53, CDK4, NF1, BRCA1, BRCA2, POLD1, POLE, SMARCA4, HOXB13 (sequencing and deletion/duplication); EPCAM, GREM1 (deletion/duplication only).  Results: Germline- Negative, no variants detected.  Tumor/Somatic-  BRAF V600E detected, No variants in KRAS or NRAS detected.  MLH1 promoter hypermethylation present, Microsatellite High.   Somatic pathogenic mutations in MSH2 c.687delA and pathogenic mutation in MSH6 c.3261dupC.  2 Somatic Variants of uncertain significance in MSH2 called p.A123T and p.D654N.   The date of this test report is 04/07/2017.     04/26/2017 - 06/12/2017 Chemotherapy   Xeloda 2000mg in the AM and 1500mg in the PM started on 04/26/17. Changed to 1500mg BID on 05/16/17   06/13/2017 Imaging    CT CAP W Contrast 06/13/17 IMPRESSION: 1. Continued regression of hepatic metastatic disease. No new or progressive findings. 2. Surgical changes from a right hemicolectomy. No findings for recurrent cancer. No mesenteric or retroperitoneal mass or Adenopathy.   09/19/2017 Imaging   CT AP W Contrast 09/19/17  IMPRESSION: 1. The inferior right lobe of liver lesion appears slightly increased in size when compared with the previous exam. 2. No new liver lesions identified. 3. Stable appearance of the colon status post right hemicolectomy. 4.  Aortic Atherosclerosis (ICD10-I70.0).     10/01/2017 Imaging    MRI Abdomen W WO Contrast IMPRESSION: 1. Two small, subtle liver lesions are again identified involving segment 7 and segment 6. When compared with the MRI from 12/28/2016 these are not significantly changed. Compared with the MRI from 09/15/2016 the peripherally enhancing lesion within segment 7 has decreased in size in the interval. There has been no significant change in the segment 6 lesion. 2. No new foci of suspected metastasis identified.   03/29/2018 Imaging   MRI Abdomen  IMPRESSION: 1. The 2 small subtle liver lesions involving segment 7 and segment 6 are again noted. When compared with 10/01/2017 these are stable to minimally decreased in size. No new liver lesion identified. 2.  Aortic Atherosclerosis (ICD10-I70.0).     03/30/2018 Imaging   CT chest  IMPRESSION: 1. Solitary new solid pulmonary nodule in the anterior right middle lobe measuring 4 mm. Recommend attention on follow-up chest CT in 3 months. 2. No additional potential findings of metastatic disease in he chest. 3. Nonobstructing left nephrolithiasis. 4. One vessel coronary atherosclerosis.   Aortic Atherosclerosis (ICD10-I70.0).     05/03/2019 Imaging   CT CAP W Contrast  IMPRESSION: 1. Stable exam. No new or progressive findings to suggest recurrent or metastatic disease in the chest, abdomen, or pelvis. 2. Stable tiny bilateral pulmonary nodules, likely benign. 3. 18 mm inferior right liver lesion seen on prior studies is more subtle on today's exam and measures smaller. A second tiny lesion described on prior studies towards the dome of the liver is inapparent on today's exam 4. Stable small low-density lesions in the right kidney, likely cysts. Continued attention on follow-up recommended. 5. Aortic Atherosclerosis (ICD10-I70.0).   06/10/2020 Imaging   CT AP  IMPRESSION: 1.   Stable surgical changes from a right hemicolectomy. No findings for recurrent tumor or adenopathy. 2. Overall stable  low-attenuation lesion in segment 6 of the liver. No new or progressive findings in the liver. 3. Status post cholecystectomy. No biliary dilatation. 4. Stable lower pole left renal calculus. 5. Aortic atherosclerosis.   Aortic Atherosclerosis (ICD10-I70.0).        CURRENT THERAPY:  Surveillance   INTERVAL HISTORY:  [name] is here for a follow up of colon cancer. She was last seen by me in 04/2019 and seen by NP Lacie 6 months ago in interim. She presents to the clinic alone. She notes she feels fine. She notes she was recently seen by her PCP where her Oxygen was 94%. She notes this is down and feels SOB. She attributes this to history of long term second-hand smoke. She denies stomach bloating or nausea. She notes a fungal infection of her left toenail recently. 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 She notes she had increased urination after scan yesterday. She notes no more pain from her prior PAC location.    REVIEW OF SYSTEMS:   Constitutional: Denies fevers, chills or abnormal weight loss Eyes: Denies blurriness of vision Ears, nose, mouth, throat, and face: Denies mucositis or sore throat Respiratory: Denies cough, dyspnea or wheezes Cardiovascular: Denies palpitation, chest discomfort or lower extremity swelling Gastrointestinal:  Denies nausea, heartburn or change in bowel habits Skin: Denies abnormal skin rashes (+) Left toe nail fungal infection Lymphatics: Denies new lymphadenopathy or easy bruising Neurological:Denies numbness, tingling or new weaknesses Behavioral/Psych: Mood is stable, no new changes  All other systems were reviewed with the patient and are negative.  MEDICAL HISTORY:  Past Medical History:  Diagnosis Date   Anemia    Arthritis    Astigmatism    both eyes   Cancer (HCC)    colon surgery done last chemo tx 01-17-17   Cataract    Colon cancer (HCC)    Coronary artery disease, non-occlusive     Dyspnea    more so exertion   Dysrhythmia    skipped beats every now and then   GERD (gastroesophageal reflux disease)    Glaucoma    both eyes right eye worse than left   Hammer toes of both feet    Headache    migraines with aura   Heel spur    both heels and front   HSV (herpes simplex virus) infection    Hyperglycemia    denies   Hyperlipidemia    Hypertension    IBS (irritable bowel syndrome)    IC (interstitial cystitis)    Insomnia disorder related to known organic factor    Osteoporosis    PONV (postoperative nausea and vomiting)    has had more surgeries, and had no problems   Stroke (HCC) 2016   Vitamin D deficiency     SURGICAL HISTORY: Past Surgical History:  Procedure Laterality Date   ABDOMINAL HYSTERECTOMY     1 ovary and womb removed   CARDIAC CATHETERIZATION  7/12   non obst dz -- not sure what yr   CHOLECYSTECTOMY     EUS N/A 02/03/2017   Procedure: UPPER ENDOSCOPIC ULTRASOUND (EUS) LINEAR;  Surgeon: Jacobs, Daniel P, MD;  Location: [facility];  Service: Endoscopy;  Laterality: N/A;   EYE SURGERY Bilateral    ioc for cataracts    LAPAROSCOPIC RIGHT COLECTOMY Right 11/10/2016   Procedure: LAPAROSCOPIC EXTENDED RIGHT   COLECTOMY;  Surgeon: Byerly, Faera, MD;  Location: [facility];  Service: General;  Laterality: Right;   PORT-A-CATH REMOVAL Left 11/21/2019   Procedure: REMOVAL PORT-A-CATH;  Surgeon: Byerly, Faera, MD;  Location: [facility];  Service: General;  Laterality: Left;   PORTACATH PLACEMENT Left 12/22/2016   Procedure: INSERTION PORT-A-CATH;  Surgeon: Byerly, Faera, MD;  Location: [facility];  Service: General;  Laterality: Left;   ROTATOR CUFF REPAIR Bilateral     I have reviewed the social history and family history with the patient and they are unchanged from previous note.  ALLERGIES:  is allergic to salmon [fish allergy], ampicillin, codeine, flagyl [metronidazole], meperidine hcl, neomycin, propoxyphene hcl, sulfa  antibiotics, and tramadol hcl.  MEDICATIONS:  Current Outpatient Medications  Medication Sig Dispense Refill   b complex vitamins tablet Take 1 tablet by mouth daily.     Calcium Carb-Cholecalciferol (CALCIUM 600 + D PO) Take 1 capsule by mouth in the morning and at bedtime.     doxycycline (VIBRA-TABS) 100 MG tablet Take 1 tablet (100 mg total) by mouth 2 (two) times daily. 20 tablet 0   ELIQUIS 5 MG TABS tablet TAKE 1 TABLET BY MOUTH TWICE A DAY 120 tablet 1   ELIQUIS 5 MG TABS tablet TAKE 1 TABLET BY MOUTH TWICE A DAY 60 tablet 3   Multiple Vitamin (MULTIVITAMIN WITH MINERALS) TABS tablet Take 1 tablet by mouth daily.      Multiple Vitamins-Minerals (HAIR SKIN AND NAILS FORMULA PO) Take by mouth.     rosuvastatin (CRESTOR) 20 MG tablet TAKE 1 TABLET BY MOUTH EVERY DAY 90 tablet 3   Zinc Sulfate (ZINC 15 PO) Take by mouth.     No current facility-administered medications for this visit.    PHYSICAL EXAMINATION: ECOG PERFORMANCE STATUS: 0 - Asymptomatic  There were no vitals filed for this visit.  There were no vitals filed for this visit.   GENERAL:alert, no distress and comfortable SKIN: skin color, texture, turgor are normal, no rashes or significant lesions EYES: normal, Conjunctiva are pink and non-injected, sclera clear  NECK: supple, thyroid normal size, non-tender, without nodularity LYMPH:  no palpable lymphadenopathy in the cervical, axillary  LUNGS: clear to auscultation and percussion with normal breathing effort HEART: regular rate & rhythm and no murmurs and no lower extremity edema ABDOMEN:abdomen soft, non-tender and normal bowel sounds Musculoskeletal:no cyanosis of digits and no clubbing  NEURO: alert & oriented x 3 with fluent speech, no focal motor/sensory deficits  LABORATORY DATA:  I have reviewed the data as listed    Latest Ref Rng & Units 06/24/2021   12:29 PM 12/25/2020   10:26 AM 06/11/2020   12:51 PM  CBC  WBC 4.0 - 10.5 K/uL 8.0  7.4  6.5    Hemoglobin 12.0 - 15.0 g/dL 13.0  12.6  12.2   Hematocrit 36.0 - 46.0 % 39.3  38.5  37.8   Platelets 150.0 - 400.0 K/uL 243.0  249  242         Latest Ref Rng & Units 07/22/2021   11:15 AM 06/24/2021   12:29 PM 12/25/2020   10:26 AM  CMP  Glucose 70 - 99 mg/dL 72  119  105   BUN 6 - 23 mg/dL 30  31  11   Creatinine 0.40 - 1.20 mg/dL 1.19  1.22  0.87   Sodium 135 - 145 mEq/L 138  141  141   Potassium 3.5 - 5.1 mEq/L 3.9  4.1    3.1   Chloride 96 - 112 mEq/L 102  103  105   CO2 19 - 32 mEq/L _0 Calcium 8.4 - 10.5 mg/dL 9.8  10.1  9.5   Total Protein 6.0 - 8.3 g/dL  6.9    Total Bilirubin 0.2 - 1.2 mg/dL  0.6    Alkaline Phos 39 - 117 U/L  65    AST 0 - 37 U/L  19    ALT 0 - 35 U/L  19        RADIOGRAPHIC STUDIES: I have personally reviewed the radiological images as listed and agreed with the findings in the report. No results found.   No problem-specific Assessment & Plan notes found for this encounter.   No orders of the defined types were placed in this encounter.  All questions were answered. The patient knows to call the clinic with any problems, questions or concerns. No barriers to learning was detected. The total time spent in the appointment was 30 minutes.     [name], MD 08/30/2021

## 2021-08-31 ENCOUNTER — Other Ambulatory Visit: Payer: Self-pay

## 2021-08-31 ENCOUNTER — Inpatient Hospital Stay: Payer: Medicare HMO | Admitting: Hematology

## 2021-08-31 ENCOUNTER — Inpatient Hospital Stay: Payer: Medicare HMO | Attending: Family Medicine

## 2021-08-31 ENCOUNTER — Encounter: Payer: Self-pay | Admitting: Hematology

## 2021-08-31 VITALS — BP 135/63 | HR 65 | Temp 98.6°F | Resp 18 | Ht 63.5 in | Wt 160.6 lb

## 2021-08-31 DIAGNOSIS — I4891 Unspecified atrial fibrillation: Secondary | ICD-10-CM | POA: Insufficient documentation

## 2021-08-31 DIAGNOSIS — C188 Malignant neoplasm of overlapping sites of colon: Secondary | ICD-10-CM

## 2021-08-31 DIAGNOSIS — Z85038 Personal history of other malignant neoplasm of large intestine: Secondary | ICD-10-CM | POA: Diagnosis not present

## 2021-08-31 DIAGNOSIS — Z8673 Personal history of transient ischemic attack (TIA), and cerebral infarction without residual deficits: Secondary | ICD-10-CM | POA: Diagnosis not present

## 2021-08-31 DIAGNOSIS — C189 Malignant neoplasm of colon, unspecified: Secondary | ICD-10-CM

## 2021-08-31 DIAGNOSIS — I10 Essential (primary) hypertension: Secondary | ICD-10-CM

## 2021-08-31 DIAGNOSIS — Z7901 Long term (current) use of anticoagulants: Secondary | ICD-10-CM | POA: Insufficient documentation

## 2021-08-31 DIAGNOSIS — Z9049 Acquired absence of other specified parts of digestive tract: Secondary | ICD-10-CM | POA: Insufficient documentation

## 2021-08-31 DIAGNOSIS — D500 Iron deficiency anemia secondary to blood loss (chronic): Secondary | ICD-10-CM

## 2021-08-31 LAB — COMPREHENSIVE METABOLIC PANEL
ALT: 21 U/L (ref 0–44)
AST: 23 U/L (ref 15–41)
Albumin: 4.2 g/dL (ref 3.5–5.0)
Alkaline Phosphatase: 57 U/L (ref 38–126)
Anion gap: 7 (ref 5–15)
BUN: 21 mg/dL (ref 8–23)
CO2: 27 mmol/L (ref 22–32)
Calcium: 9.5 mg/dL (ref 8.9–10.3)
Chloride: 107 mmol/L (ref 98–111)
Creatinine, Ser: 1.12 mg/dL — ABNORMAL HIGH (ref 0.44–1.00)
GFR, Estimated: 49 mL/min — ABNORMAL LOW (ref >60)
Glucose, Bld: 80 mg/dL (ref 70–99)
Potassium: 3.9 mmol/L (ref 3.5–5.1)
Sodium: 141 mmol/L (ref 135–145)
Total Bilirubin: 0.6 mg/dL (ref 0.3–1.2)
Total Protein: 7 g/dL (ref 6.5–8.1)

## 2021-08-31 LAB — CBC WITH DIFFERENTIAL (CANCER CENTER ONLY)
Abs Immature Granulocytes: 0.02 10*3/uL (ref 0.00–0.07)
Basophils Absolute: 0.1 10*3/uL (ref 0.0–0.1)
Basophils Relative: 1 %
Eosinophils Absolute: 0.2 10*3/uL (ref 0.0–0.5)
Eosinophils Relative: 2 %
HCT: 37.7 % (ref 36.0–46.0)
Hemoglobin: 12.4 g/dL (ref 12.0–15.0)
Immature Granulocytes: 0 %
Lymphocytes Relative: 22 %
Lymphs Abs: 1.6 10*3/uL (ref 0.7–4.0)
MCH: 30.6 pg (ref 26.0–34.0)
MCHC: 32.9 g/dL (ref 30.0–36.0)
MCV: 93.1 fL (ref 80.0–100.0)
Monocytes Absolute: 0.7 10*3/uL (ref 0.1–1.0)
Monocytes Relative: 9 %
Neutro Abs: 5 10*3/uL (ref 1.7–7.7)
Neutrophils Relative %: 66 %
Platelet Count: 253 10*3/uL (ref 150–400)
RBC: 4.05 MIL/uL (ref 3.87–5.11)
RDW: 13.2 % (ref 11.5–15.5)
WBC Count: 7.5 10*3/uL (ref 4.0–10.5)
nRBC: 0 % (ref 0.0–0.2)

## 2021-08-31 LAB — IRON AND IRON BINDING CAPACITY (CC-WL,HP ONLY)
Iron: 117 ug/dL (ref 28–170)
Saturation Ratios: 33 % — ABNORMAL HIGH (ref 10.4–31.8)
TIBC: 360 ug/dL (ref 250–450)
UIBC: 243 ug/dL (ref 148–442)

## 2021-08-31 LAB — CEA (IN HOUSE-CHCC): CEA (CHCC-In House): 1.44 ng/mL (ref 0.00–5.00)

## 2021-08-31 LAB — RETICULOCYTES
Immature Retic Fract: 9.2 % (ref 2.3–15.9)
RBC.: 4.01 MIL/uL (ref 3.87–5.11)
Retic Count, Absolute: 68.6 10*3/uL (ref 19.0–186.0)
Retic Ct Pct: 1.7 % (ref 0.4–3.1)

## 2021-08-31 LAB — FERRITIN: Ferritin: 34 ng/mL (ref 11–307)

## 2021-10-07 ENCOUNTER — Encounter: Payer: Self-pay | Admitting: Hematology

## 2021-10-07 ENCOUNTER — Emergency Department (HOSPITAL_BASED_OUTPATIENT_CLINIC_OR_DEPARTMENT_OTHER): Payer: Medicare HMO

## 2021-10-07 ENCOUNTER — Encounter (HOSPITAL_COMMUNITY): Payer: Self-pay

## 2021-10-07 ENCOUNTER — Emergency Department (HOSPITAL_COMMUNITY)
Admission: EM | Admit: 2021-10-07 | Discharge: 2021-10-07 | Disposition: A | Discharge status: Home / Self Care | Payer: Medicare HMO | Attending: Emergency Medicine | Admitting: Emergency Medicine

## 2021-10-07 ENCOUNTER — Emergency Department (HOSPITAL_COMMUNITY): Payer: Medicare HMO

## 2021-10-07 DIAGNOSIS — Z7901 Long term (current) use of anticoagulants: Secondary | ICD-10-CM | POA: Insufficient documentation

## 2021-10-07 DIAGNOSIS — W19XXXA Unspecified fall, initial encounter: Secondary | ICD-10-CM

## 2021-10-07 DIAGNOSIS — Z85038 Personal history of other malignant neoplasm of large intestine: Secondary | ICD-10-CM | POA: Insufficient documentation

## 2021-10-07 DIAGNOSIS — R52 Pain, unspecified: Secondary | ICD-10-CM | POA: Diagnosis not present

## 2021-10-07 DIAGNOSIS — Z86718 Personal history of other venous thrombosis and embolism: Secondary | ICD-10-CM | POA: Insufficient documentation

## 2021-10-07 DIAGNOSIS — M79604 Pain in right leg: Secondary | ICD-10-CM | POA: Insufficient documentation

## 2021-10-07 DIAGNOSIS — M25551 Pain in right hip: Secondary | ICD-10-CM | POA: Diagnosis not present

## 2021-10-07 NOTE — ED Provider Notes (Signed)
[name] DEPT Provider Note   CSN: [id number] Arrival date & time: 10/07/21  1239     History  Chief Complaint  Patient presents with   [name] is a 81 y.o. female.  With past medical history of DVT, unknown clotting disorder and colon cancer who presents to the ED for evaluation of a fall.  Patient states that 3 days ago she was walking outside at her home when she tripped over a dog leash.  She hit her head and her left knee on the ground.  Denies loss of consciousness, not currently on blood thinners.  Denies headaches, vision changes, retrograde amnesia.  Patient states pain is mostly in her right hamstring.  Describes the pain as a tightness.  Also has had hip pain since last winter due to being kicked by a horse and falling.  Patient is concerned for having a blood clot in the right leg due to history of DVT and 3 small bug bites on medial aspect of left right thigh.  Reports pain is a 2 out of 10 at rest, 9 out of 10.  Patient states that she is taking 600 mg of ibuprofen at home for pain with moderate relief.  Denies chest pain, shortness of breath, hemoptysis, recent immobilization, recent malignancy treatment   Fall Pertinent negatives include no chest pain, no abdominal pain and no shortness of breath.       Home Medications Prior to Admission medications   Medication Sig Start Date End Date Taking? Authorizing Provider  b complex vitamins tablet Take 1 tablet by mouth daily.    [provider]  Calcium Carb-Cholecalciferol (CALCIUM 600 + D PO) Take 1 capsule by mouth in the morning and at bedtime.    [provider]  doxycycline (VIBRA-TABS) 100 MG tablet Take 1 tablet (100 mg total) by mouth 2 (two) times daily. 06/24/21   Tower, [name], MD  ELIQUIS 5 MG TABS tablet TAKE 1 TABLET BY MOUTH TWICE A DAY 10/21/20   [name], MD  ELIQUIS 5 MG TABS tablet TAKE 1 TABLET BY MOUTH TWICE A DAY 07/01/21   [name], MD  Multiple Vitamin (MULTIVITAMIN WITH MINERALS) TABS tablet Take 1 tablet by mouth daily.     [provider]  Multiple Vitamins-Minerals (HAIR SKIN AND NAILS FORMULA PO) Take by mouth.    [provider]  rosuvastatin (CRESTOR) 20 MG tablet TAKE 1 TABLET BY MOUTH EVERY DAY 05/25/21   Tower, [name], MD  Zinc Sulfate (ZINC 15 PO) Take by mouth.    [provider]      Allergies    Hyman Hopes allergy], Ampicillin, Codeine, Flagyl [metronidazole], Meperidine hcl, Neomycin, Propoxyphene hcl, Sulfa antibiotics, and Tramadol hcl    Review of Systems   Review of Systems  Constitutional:  Negative for chills and fever.  Respiratory:  Negative for cough and shortness of breath.   Cardiovascular:  Negative for chest pain and palpitations.  Gastrointestinal:  Negative for abdominal pain.  Musculoskeletal:  Positive for arthralgias and myalgias. Negative for back pain.  Skin:  Negative for color change and rash.  Neurological:  Negative for dizziness, seizures, syncope and light-headedness.  Psychiatric/Behavioral:  Negative for confusion.   All other systems reviewed and are negative.   Physical Exam Updated Vital Signs BP 133/64   Pulse (!) 56   Temp 97.7 F (36.5 C) (Oral)   Resp 16   SpO2 96%  Physical  Exam Vitals and nursing note reviewed.  Constitutional:      General: She is not in acute distress.    Appearance: Normal appearance. She is normal weight. She is not ill-appearing.  HENT:     Head: Normocephalic and atraumatic.  Cardiovascular:     Pulses:          Dorsalis pedis pulses are 2+ on the right side and 2+ on the left side.  Pulmonary:     Effort: Pulmonary effort is normal. No respiratory distress.  Abdominal:     General: Abdomen is flat.  Musculoskeletal:        General: Normal range of motion.     Cervical back: Neck supple.     Right hip: No tenderness. Normal range of motion. Normal strength.     Left hip: No tenderness.  Normal range of motion. Normal strength.     Right upper leg: Tenderness present.     Left upper leg: No tenderness.     Right lower leg: No tenderness. No edema.     Left lower leg: No tenderness. No edema.       Legs:     Comments: 3 small erythematous lesions to the right medial aspect of her thigh.  1 cm x 1 cm.  Consistent with bug bites.  Skin:    General: Skin is warm and dry.     Capillary Refill: Capillary refill takes less than 2 seconds.  Neurological:     Mental Status: She is alert and oriented to person, place, and time.  Psychiatric:        Mood and Affect: Mood normal.        Behavior: Behavior normal.     ED Results / Procedures / Treatments   Labs (all labs ordered are listed, but only abnormal results are displayed) Labs Reviewed - No data to display  EKG None  Radiology DG Hip Unilat W or Wo Pelvis 2-3 Views Right  Result Date: 10/07/2021 CLINICAL DATA:  Fall.  Right hip pain. EXAM: DG HIP (WITH OR WITHOUT PELVIS) 2-3V RIGHT COMPARISON:  None Available. FINDINGS: There is no evidence of hip fracture or dislocation. Mild bilateral superolateral hip joint space narrowing with marginal spurring. Small sclerotic lesion in the right femoral neck, unchanged from multiple prior examinations. 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 IMPRESSION: 1.  No evidence of fracture or dislocation. 2.  Mild bilateral hip osteoarthritis. Electronically Signed   By: Keane Police D.O.   On: 10/07/2021 13:47    Procedures Procedures    Medications Ordered in ED Medications - No data to display  ED Course/ Medical Decision Making/ A&P Clinical Course as of 10/07/21 1423  Wed Oct 07, 2021  1351 DG Hip Unilat W or Wo Pelvis 2-3 Views Right I personally reviewed and interpreted the images.  Osteoarthritis, no acute osseous abnormality [AS]  1418 VAS [place] LOWER EXTREMITY VENOUS (DVT) (7a-7p) I personally reviewed and interpreted the image.  No DVT. [AS]  0981 Wells score  1.5.  Low suspicion for PE. [AS]    Clinical Course User Index [AS] Claudean Leavelle, [place] Folk, PA-C                           Medical Decision Making This patient presents to the ED for concern of fall, this involves an extensive number of treatment options, and is a complaint that carries with it a high risk of complications and morbidity.  The differential diagnosis includes right lower extremity DVT, contusion secondary to mechanical fall, hip osteoarthritis   Co morbidities that complicate the patient evaluation       Previous DVT, history of colon cancer with chemotherapy  My initial workup includes x-ray of bilateral hip to assess for osseous abnormality and ultrasound right lower extremity to assess for DVT  Additional history obtained from: Nursing notes from this visit. Family daughter is present and provides a portion of the history    I ordered imaging studies including x-ray right hip and ultrasound right lower extremity I independently visualized and interpreted imaging which showed no acute osseous abnormality of the hip, mild bilateral osteoarthritis.  Ultrasound shows no DVT. I agree with the radiologist interpretation  Cardiac Monitoring:       The patient was not maintained on a cardiac monitor.   Hemodynamically stable at the time of discharge.  All imaging unremarkable.  Patient's pain may be secondary to her osteoarthritis or impact from mechanical fall.  It is well managed with over-the-counter analgesics such as ibuprofen at home.  No clinical signs of DVT.  Pulses strong and intact bilaterally.  Patient did have 3 small erythematous spots to the medial aspect of her right thigh.  These are likely secondary to bug bites which is consistent with patient's history of being outside daily.  I discussed with the patient that she may alternate Tylenol and ibuprofen at home for pain as needed.  Patient should follow-up with her primary care in 1 week for reevaluation of the  pain.  Also discussed the patient may follow-up with orthopedic surgery for evaluation of her hip arthritis and possible surgical intervention.  I gave her information for this.   At this time there does not appear to be any evidence of an acute emergency medical condition and the patient appears stable for discharge with appropriate outpatient follow up. Diagnosis was discussed with patient who verbalizes understanding of care plan and is agreeable to discharge. I have discussed return precautions with patient and daughter who verbalizes understanding. Patient encouraged to follow-up with their PCP within 1 week. Patient should also follow-up with orthopedic surgery for evaluation of her osteoarthritis and hip pain. All questions answered.  Note: Portions of this report may have been transcribed using voice recognition software. Every effort was made to ensure accuracy; however, inadvertent computerized transcription errors may still be present.   Amount and/or Complexity of Data Reviewed Radiology:  Decision-making details documented in ED Course.          Final Clinical Impression(s) / ED Diagnoses Final diagnoses:  None    Rx / DC Orders ED Discharge Orders     None         Roylene Reason, Hershal Coria 10/07/21 1448    Wyvonnia Dusky, MD 10/07/21 1606

## 2021-10-07 NOTE — Discharge Instructions (Addendum)
You have been seen today for your complaint of a fall and right hamstring pain. Your imaging was reassuring.  Your ultrasound did not show DVT.  Your hip x-ray showed moderate osteoarthritis, but no fractures. Home care instructions are as follows:  You should alternate Tylenol and ibuprofen for pain every 6-8 hours.  You should follow dosing instructions on the bottle. Follow up with: Your primary care.  You should also follow-up with orthopedics for your chronic hip pain.  You should call the number in this discharge paperwork to schedule an appointment. Please seek immediate medical care if you develop any of the following symptoms: Chest pain, shortness of breath, increasing leg pain, swelling or redness of your extremities, loss of consciousness or confusion At this time there does not appear to be the presence of an emergent medical condition, however there is always the potential for conditions to change. Please read and follow the below instructions.  Do not take your medicine if  develop an itchy rash, swelling in your mouth or lips, or difficulty breathing; call 911 and seek immediate emergency medical attention if this occurs.  You may review your lab tests and imaging results in their entirety on your MyChart account.  Please discuss all results of fully with your primary care provider and other specialist at your follow-up visit.  Note: Portions of this text may have been transcribed using voice recognition software. Every effort was made to ensure accuracy; however, inadvertent computerized transcription errors may still be present.

## 2021-10-07 NOTE — ED Triage Notes (Signed)
Pt arrived via POV, states mechanical fall, denies any blood thinners or LOC. C/o right sided hip/leg pain/cramping, reddened itchy area to right thigh.

## 2021-10-07 NOTE — ED Provider Triage Note (Signed)
Emergency Medicine Provider Triage Evaluation Note  [name] , a 81 y.o. female  was evaluated in triage.  Pt complains of right leg pain.  Patient reports about 3 to 4 days ago she tripped over a dog leash and landed on her left side but got up and was not having any issues.  She reports that over the past few days though she has noticed some pain starting in her right thigh and going up into her hip and down the back of her leg.  Reports a history of prior DVT and is concerned she could have this again, not currently on blood thinners, no chest pain or shortness of breath.  Review of Systems  Positive: Leg pain Negative: Chest pain, shortness of breath, swelling  Physical Exam  BP 133/64   Pulse (!) 56   Temp 97.7 F (36.5 C) (Oral)   Resp 16   SpO2 96%  Gen:   Awake, no distress   Resp:  Normal effort  MSK:   Moves extremities without difficulty, some tenderness over the right hip and right thigh, there are 3 red welts noted to the right inner thigh, patient unsure if she has been stung by anything but does report some itching in this area, distal pulses 2+ Other:  No tenderness over the chest, abdomen or pelvis, no midline spinal tenderness  Medical Decision Making  Medically screening exam initiated at 1:04 PM.  Appropriate orders placed.  [name] was informed that the remainder of the evaluation will be completed by another provider, this initial triage assessment does not replace that evaluation, and the importance of remaining in the ED until their evaluation is complete.  Fall 3 to 4 days ago but no pain over the left side from fall, not currently on blood thinners, right leg pain with prior history of DVT   Jacqlyn Larsen, [name] 10/07/21 1317

## 2021-10-07 NOTE — Progress Notes (Signed)
Right lower extremity venous duplex has been completed. Preliminary results can be found in CV Proc through chart review.  Results were given to Benedetto Goad PA.  10/07/21 2:04 PM Carlos Levering RVT

## 2021-10-27 ENCOUNTER — Telehealth: Payer: Self-pay

## 2021-10-27 NOTE — Chronic Care Management (AMB) (Signed)
    Chronic Care Management Pharmacy Assistant   Name: [name]  MRN: [id number] DOB: March 03, 1940  Reason for Encounter: Hospital Follow Up    Medications: Outpatient Encounter Medications as of 10/27/2021  Medication Sig   b complex vitamins tablet Take 1 tablet by mouth daily.   Calcium Carb-Cholecalciferol (CALCIUM 600 + D PO) Take 1 capsule by mouth in the morning and at bedtime.   doxycycline (VIBRA-TABS) 100 MG tablet Take 1 tablet (100 mg total) by mouth 2 (two) times daily.   ELIQUIS 5 MG TABS tablet TAKE 1 TABLET BY MOUTH TWICE A DAY   ELIQUIS 5 MG TABS tablet TAKE 1 TABLET BY MOUTH TWICE A DAY   Multiple Vitamin (MULTIVITAMIN WITH MINERALS) TABS tablet Take 1 tablet by mouth daily.    Multiple Vitamins-Minerals (HAIR SKIN AND NAILS FORMULA PO) Take by mouth.   rosuvastatin (CRESTOR) 20 MG tablet TAKE 1 TABLET BY MOUTH EVERY DAY   Zinc Sulfate (ZINC 15 PO) Take by mouth.   No facility-administered encounter medications on file as of 10/27/2021.      Reviewed hospital notes for details of recent visit. Patient has been contacted by Transitions of Care team: No  Admitted to the ED on 10/07/21. Discharge date was /16/23.  Discharged from [facility] ED; Discharge diagnosis (Principal Problem): Fall Patient was discharged to Home  Brief summary of hospital course: Hemodynamically stable at the time of discharge.  All imaging unremarkable.  Patient's pain may be secondary to her osteoarthritis or impact from mechanical fall.  It is well managed with over-the-counter analgesics such as ibuprofen at home.  No clinical signs of DVT.  Pulses strong and intact bilaterally.  Patient did have 3 small erythematous spots to the medial aspect of her right thigh.  These are likely secondary to bug bites which is consistent with patient's history of being outside daily.  I discussed with the patient that she may alternate Tylenol and ibuprofen at home for pain as needed.  Patient  should follow-up with her primary care in 1 week for reevaluation of the pain.  Also discussed the patient may follow-up with orthopedic surgery for evaluation of her hip arthritis and possible surgical intervention.  I gave her information for this.  New?Medications Started at [facility] Discharge:?? -Started tylenol and  alternate ibuprofen every 6-8 hours for pain    Next CCM appt: none  Other upcoming appts: 12/15/21-Dexa scan  Charlene Brooke, PharmD notified and will determine if action is needed.    Avel Sensor, [place]  [phone]

## 2021-11-11 NOTE — Telephone Encounter (Signed)
Contacted the patient  and rescheduled Initial Visit with CPP for 11/30/21 @ 1:30    Avel Sensor, [place]  [phone]

## 2021-11-25 ENCOUNTER — Telehealth: Payer: Self-pay

## 2021-11-25 NOTE — Chronic Care Management (AMB) (Signed)
    Chronic Care Management Pharmacy Assistant   Name: Melah Ebling  MRN: [id number] DOB: 1940-08-29  [place] is an 81 y.o. year old female who presents for his initial CCM visit with the clinical pharmacist.  Reason for Encounter: Initial Questions   Conditions to be addressed/monitored: HTN and HLD  Recent office visits:  06/24/21-Marne Tower,MD(PCP)-AWV,discuss Eliquis with cardiology,labs(Kidney numbers are up a bit -this could be from dehydration  increase fluids to 64 oz daily (mostly water)  Re check bmet in 2 wk with urinalysis Other labs are stable Cholesterol is well controlled-prescription for '100mg'$  for tick bourne illness.Discussed screenings, vaccines.  Recent consult visits:  08/31/21-Yan Feng,MD(onc)-f/u colon cancer,no medication changes   Hospital visits:  None in previous 6 months 10/07/21-Matthew Trifan,MD( [facility])- fall-Ultrasound shows no DVT.Tylenol and ibuprofen at home for pain as needed. No admission  Medications: Outpatient Encounter Medications as of 11/25/2021  Medication Sig   b complex vitamins tablet Take 1 tablet by mouth daily.   Calcium Carb-Cholecalciferol (CALCIUM 600 + D PO) Take 1 capsule by mouth in the morning and at bedtime.   doxycycline (VIBRA-TABS) 100 MG tablet Take 1 tablet (100 mg total) by mouth 2 (two) times daily.   ELIQUIS 5 MG TABS tablet TAKE 1 TABLET BY MOUTH TWICE A DAY   ELIQUIS 5 MG TABS tablet TAKE 1 TABLET BY MOUTH TWICE A DAY   Multiple Vitamin (MULTIVITAMIN WITH MINERALS) TABS tablet Take 1 tablet by mouth daily.    Multiple Vitamins-Minerals (HAIR SKIN AND NAILS FORMULA PO) Take by mouth.   rosuvastatin (CRESTOR) 20 MG tablet TAKE 1 TABLET BY MOUTH EVERY DAY   Zinc Sulfate (ZINC 15 PO) Take by mouth.   No facility-administered encounter medications on file as of 11/25/2021.    Lab Results  Component Value Date/Time   HGBA1C 5.9 06/24/2021 12:29 PM   HGBA1C 6.1 03/26/2020 02:36 PM      BP Readings from Last 3 Encounters:  10/07/21 (!) 147/71  08/31/21 135/63  06/24/21 106/68    Unsuccessful attempt to reach patient. Left patient message reminding patient of appointment.  Patient contacted to confirm telephone appointment with Charlene Brooke, Pharm D, on 11/30/21 at 1:30.   CCM referral has been placed prior to visit?  Yes   Star Rating Drugs:  Medication:  Last Fill: Day Supply Rosuvastatin   09/02/21 90  Care Gaps: Annual wellness visit in last year? Yes Most Recent BP reading:135/63  08/31/21   Charlene Brooke, CPP notified  Avel Sensor, [place]  [phone]

## 2021-11-30 ENCOUNTER — Ambulatory Visit: Payer: Medicare HMO | Admitting: Pharmacist

## 2021-11-30 DIAGNOSIS — M810 Age-related osteoporosis without current pathological fracture: Secondary | ICD-10-CM

## 2021-11-30 DIAGNOSIS — E7800 Pure hypercholesterolemia, unspecified: Secondary | ICD-10-CM

## 2021-11-30 DIAGNOSIS — I2510 Atherosclerotic heart disease of native coronary artery without angina pectoris: Secondary | ICD-10-CM

## 2021-11-30 NOTE — Progress Notes (Unsigned)
Chronic Care Management Pharmacy Note  11/30/2021 Name:  Ezma Rehm MRN:  [id number] DOB:  03-01-1940  Summary: CCM Initial visit -Pt is no longer taking Eliquis - previously PCP and oncologist agreed she can stop Eliquis. Removed from med list. -Osteoporosis: pt has repeat DEXA later this month; previously she declined fosamax due to fear of side effects. Discussed benefits of pharmacological treatment of osteoporosis to prevent fractures -Sleep disturbance: pt reports she sleeps 1-2 hrs per night and has struggled with this for years; she requests medication to help her sleep - melatonin and benadryl do not help her  Recommendations/Changes made from today's visit: -Recommend trazodone 50 mg - 1/2-1 tablet at bedtime -Consider Fosamax or Prolia for osteoporosis, pending results of upcoming DEXA  Plan: -Pharmacist follow up televisit 1 month -PCP annual visit due 06/2022    Subjective: Jackqueline Aquilar is an 81 y.o. year old female who is a primary patient of Tower, [name], MD.  The CCM team was consulted for assistance with disease management and care coordination needs.    Engaged with patient by telephone for initial visit in response to provider referral for pharmacy case management and/or care coordination services.   Consent to Services:  The patient was given the following information about Chronic Care Management services today, agreed to services, and gave verbal consent: 1. CCM service includes personalized support from designated clinical staff supervised by the primary care provider, including individualized plan of care and coordination with other care providers 2. 24/7 contact phone numbers for assistance for urgent and routine care needs. 3. Service will only be billed when office clinical staff spend 20 minutes or more in a month to coordinate care. 4. Only one practitioner may furnish and bill the service in a calendar month. 5.The patient may stop CCM  services at any time (effective at the end of the month) by phone call to the office staff. 6. The patient will be responsible for cost sharing (co-pay) of up to 20% of the service fee (after annual deductible is met). Patient agreed to services and consent obtained.  Patient Care Team: Tower, [name], MD as PCP - General [name], MD as Consulting Physician (Hematology) [name], MD as Attending Physician (Gastroenterology) [name], MD as Consulting Physician (General Surgery) Charlton Haws, [facility] as Pharmacist (Pharmacist)  Recent office visits: 06/24/21 Dr Glori Bickers OV: annual - pt has hx of stroke and uncomfortable coming off Eliquis. Has palpitations. Wants to discuss w/ cardiology. Ordered DEXA. Cr up- increase fluids to 64 oz. Recheck BMP 2 weeks w/ urinalysis - pt never responded to messages.  06/16/21 TE w/ PCP and oncologist - OK to stop Eliquis and start aspirin.  Recent consult visits: 08/31/21 Dr Burr Medico (Oncology): completed 5 years surveillance. Follow up PRN.   Hospital visits: 10/07/21 ED visit ([facility]): fall - [place] no DVT. F/U PCP 1 week. F/u with ortho for hip evaluation.   Objective:  Lab Results  Component Value Date   CREATININE 1.12 (H) 08/31/2021   BUN 21 08/31/2021   GFR 42.43 (L) 07/22/2021   EGFR >60 02/21/2017   GFRNONAA 49 (L) 08/31/2021   GFRAA >60 05/03/2019   NA 141 08/31/2021   K 3.9 08/31/2021   CALCIUM 9.5 08/31/2021   CO2 27 08/31/2021   GLUCOSE 80 08/31/2021    Lab Results  Component Value Date/Time   HGBA1C 5.9 06/24/2021 12:29 PM   HGBA1C 6.1 03/26/2020 02:36 PM   GFR 42.43 (L)  07/22/2021 11:15 AM   GFR 41.21 (L) 06/24/2021 12:29 PM    Last diabetic Eye exam: No results found for: "HMDIABEYEEXA"  Last diabetic Foot exam: No results found for: "HMDIABFOOTEX"   Lab Results  Component Value Date   CHOL 163 06/24/2021   HDL 74.10 06/24/2021   LDLCALC 50 06/24/2021   LDLDIRECT 138.9 07/05/2012   TRIG 197.0 (H) 06/24/2021    CHOLHDL 2 06/24/2021       Latest Ref Rng & Units 08/31/2021   11:57 AM 06/24/2021   12:29 PM 06/11/2020   12:49 PM  Hepatic Function  Total Protein 6.5 - 8.1 g/dL 7.0  6.9  6.5   Albumin 3.5 - 5.0 g/dL 4.2  4.3  3.8   AST 15 - 41 U/L '23  19  21   ' ALT 0 - 44 U/L '21  19  20   ' Alk Phosphatase 38 - 126 U/L 57  65  74   Total Bilirubin 0.3 - 1.2 mg/dL 0.6  0.6  0.6     Lab Results  Component Value Date/Time   TSH 3.98 06/24/2021 12:29 PM   TSH 2.84 03/26/2020 02:36 PM       Latest Ref Rng & Units 08/31/2021   12:00 PM 06/24/2021   12:29 PM 12/25/2020   10:26 AM  CBC  WBC 4.0 - 10.5 K/uL 7.5  8.0  7.4   Hemoglobin 12.0 - 15.0 g/dL 12.4  13.0  12.6   Hematocrit 36.0 - 46.0 % 37.7  39.3  38.5   Platelets 150 - 400 K/uL 253  243.0  249     Lab Results  Component Value Date/Time   VD25OH 41.67 06/24/2021 12:29 PM   VD25OH 31.87 03/26/2020 02:36 PM    Clinical ASCVD: Yes  The ASCVD Risk score (Arnett DK, et al., 2019) failed to calculate for the following reasons:   The 2019 ASCVD risk score is only valid for ages 63 to 54       03/27/2021   12:29 PM 03/26/2020   11:35 AM 08/01/2017    3:43 PM  Depression screen PHQ 2/9  Decreased Interest 0 0 1  Down, Depressed, Hopeless 0 0 0  PHQ - 2 Score 0 0 1  Altered sleeping  0 2  Tired, decreased energy  0 0  Change in appetite  0 0  Feeling bad or failure about yourself   0 0  Trouble concentrating  0 0  Moving slowly or fidgety/restless  0 3  Suicidal thoughts  0 0  PHQ-9 Score  0 6  Difficult doing work/chores  Not difficult at all      Social History   Tobacco Use  Smoking Status Never  Smokeless Tobacco Never   BP Readings from Last 3 Encounters:  10/07/21 (!) 147/71  08/31/21 135/63  06/24/21 106/68   Pulse Readings from Last 3 Encounters:  10/07/21 68  08/31/21 65  06/24/21 75   Wt Readings from Last 3 Encounters:  08/31/21 160 lb 9.6 oz (72.8 kg)  06/24/21 152 lb 8 oz (69.2 kg)  03/27/21 150 lb (68 kg)    BMI Readings from Last 3 Encounters:  08/31/21 28.00 kg/m  06/24/21 26.59 kg/m  03/27/21 26.57 kg/m    Assessment/Interventions: Review of patient past medical history, allergies, medications, health status, including review of consultants reports, laboratory and other test data, was performed as part of comprehensive evaluation and provision of chronic care management services.   SDOH:  (Social Determinants  of Health) assessments and interventions performed: Yes SDOH Interventions    Flowsheet Row Chronic Care Management from 11/30/2021 in [place] [facility] [place] from 03/27/2021 in [place] [facility] [place] from 03/26/2020 in [place] [facility] [place] Interventions     Transportation Interventions Intervention Not Indicated -- --  Depression Interventions/Treatment  -- -- GGY6-9 Score <4 Follow-up Not Indicated  Financial Strain Interventions Intervention Not Indicated Other (Comment)  [CCM referral] --      SDOH Screenings   Food Insecurity: No Food Insecurity (03/27/2021)  Housing: Low Risk  (03/27/2021)  Transportation Needs: No Transportation Needs (12/01/2021)  Alcohol Screen: Low Risk  (03/27/2021)  Depression (PHQ2-9): Low Risk  (03/27/2021)  Financial Resource Strain: Low Risk  (12/01/2021)  Physical Activity: Unknown (03/27/2021)  Social Connections: Socially Isolated (03/27/2021)  Stress: No Stress Concern Present (03/27/2021)  Tobacco Use: Low Risk  (10/07/2021)    [place]  Allergies  Allergen Reactions   Salmon [Fish Allergy] Anaphylaxis    This happens with "CANNED SALMON" only, if eaten uncooked   Ampicillin Nausea Only    Has patient had a PCN reaction causing immediate rash, facial/tongue/throat swelling, SOB or lightheadedness with hypotension: No Has patient had a PCN reaction causing severe rash involving mucus membranes or skin necrosis: No Has patient had a PCN reaction that required  hospitalization: No Has patient had a PCN reaction occurring within the last 10 years: No If all of the above answers are "NO", then may proceed with Cephalosporin use.    Codeine Nausea And Vomiting   Flagyl [Metronidazole] Nausea And Vomiting    Patient was taking this along with Neomycin and could not differentiate which med triggered the nausea & vomiting   Meperidine Hcl Nausea And Vomiting   Neomycin Nausea And Vomiting    Patient was taking this along with Flagyl and could not differentiate which med triggered the nausea & vomiting   Propoxyphene Hcl Nausea And Vomiting   Sulfa Antibiotics Hives   Tramadol Hcl Nausea And Vomiting    Medications Reviewed Today     Reviewed by Charlton Haws, [facility] (Pharmacist) on 11/30/21 at [place] List Status: <None>   Medication Order Taking? Sig Documenting Provider Last Dose Status Informant  acetaminophen (TYLENOL) 500 MG tablet [id number] Yes Take 500 mg by mouth every 6 (six) hours as needed. [provider] Taking Active   b complex vitamins tablet [id number] Yes Take 1 tablet by mouth daily. [provider] Taking Active Self  Calcium Carb-Cholecalciferol (CALCIUM 600 + D PO) [id number] Yes Take 1 capsule by mouth in the morning and at bedtime. [provider] Taking Active   ibuprofen (ADVIL) 200 MG tablet [id number] Yes Take 200 mg by mouth every 6 (six) hours as needed. [provider] Taking Active   Multiple Vitamin (MULTIVITAMIN WITH MINERALS) TABS tablet [id number] Yes Take 1 tablet by mouth daily.  [provider] Taking Active Self  Multiple Vitamins-Minerals (HAIR SKIN AND NAILS FORMULA PO) [id number] Yes Take by mouth. [provider] Taking Active   rosuvastatin (CRESTOR) 20 MG tablet [id number] Yes TAKE 1 TABLET BY MOUTH EVERY DAY Tower, [name], MD Taking Active   Zinc Sulfate (ZINC 15 PO) [id number] Yes Take by mouth. [provider] Taking Active              Patient Active Problem List   Diagnosis Date Noted   Decreased GFR 07/21/2021   Tick  bite of chest wall 06/24/2021   Palpitations 06/24/2021   History of colon cancer 04/02/2020   Atherosclerosis of aorta ([place]) 04/02/2020   Blood clotting disorder ([place]) 06/30/2018   Fall 10/28/2017   Genetic testing 04/25/2017   History of DVT (deep vein thrombosis) 11/24/2016   Cancer of overlapping sites of colon ([place]) 09/23/2016   Estrogen deficiency 07/30/2016   Screening mammogram, encounter for 07/30/2016   Routine general medical examination at a health care facility 07/27/2016   H/O: CVA (cerebrovascular accident) 05/06/2015   HTN (hypertension) 05/06/2015   HLD (hyperlipidemia) 05/06/2015   GERD (gastroesophageal reflux disease) 05/06/2015   CAD (coronary artery disease) 05/06/2015   Seborrheic keratosis 07/05/2012   Herpes simplex 12/29/2010   Coronary artery disease, non-occlusive 10/16/2010   Insomnia 02/02/2010   HAND PAIN, BILATERAL 01/09/2010   Prediabetes 07/29/2009   ALLERGIC RHINITIS 12/04/2008   Vitamin D deficiency 04/30/2008   COLONIC POLYPS 06/28/2007   IBS 03/17/2007   INTERSTITIAL CYSTITIS 03/17/2007   Osteoporosis 03/17/2007    Immunization History  Administered Date(s) Administered   Fluad Quad(high Dose 65+) 12/04/2018, 11/15/2019   Influenza Split 12/29/2010, 11/18/2011   Influenza, High Dose Seasonal PF 12/29/2016, 01/14/2018   Influenza,inj,Quad PF,6+ Mos 03/02/2013   Influenza-Unspecified 11/22/2013, 11/23/2015   PFIZER(Purple Top)SARS-COV-2 Vaccination 06/02/2019, 06/23/2019, 03/07/2020   Pneumococcal Conjugate-13 07/28/2016   Pneumococcal Polysaccharide-23 04/30/2008   Td 04/30/2008, 04/17/2014   Zoster, Live 06/11/2010    Conditions to be addressed/monitored:  Hyperlipidemia, Coronary Artery Disease, and Osteoporosis, Sleep disturbance  Care Plan : [place]  Updates made by Charlton Haws, [place] since 12/01/2021 12:00 AM      Problem: Hyperlipidemia, Coronary Artery Disease, and Osteoporosis, Sleep disturbance      Long-Range Goal: Disease mgmt   Start Date: 11/30/2021  Expected End Date: 12/01/2022  This Visit's Progress: On track  Priority: High  Note:   Current Barriers:  Suboptimal therapeutic regimen for Osteoporosis Sleep disturbance  Pharmacist Clinical Goal(s):  Patient will adhere to plan to optimize therapeutic regimen for osteoporosis as evidenced by report of adherence to recommended medication management changes contact provider office for questions/concerns as evidenced notation of same in electronic health record through collaboration with PharmD and provider.   Interventions: 1:1 collaboration with Tower, [name], MD regarding development and update of comprehensive plan of care as evidenced by provider attestation and co-signature Inter-disciplinary care team collaboration (see longitudinal plan of care) Comprehensive medication review performed; medication list updated in electronic medical record  Hypertension (BP goal <130/80) -Controlled without medication -Counseled to monitor BP at home periodically  Hyperlipidemia: (LDL goal < 70) -Controlled - LDL 50 (06/2021) at goal -Hx nonocclusive CAD on cath ~2012. -Current treatment: Rosuvastatin 20 mg daily -  Appropriate, Effective, Safe, Accessible -Medications previously tried: atorvastatin  -Educated on Cholesterol goals;  -Recommended to continue current medication  Query clotting disorder  -Controlled -history with Eliquis - per chart review it was started 11/2016 due to acute L leg DVT following hemicolectomy complicated by post-op ileus. Eliquis was continued > 6 months due to active colon cancer/hypercoaguable state. 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 Per most recent oncology notes (05/2020), pt is under surveillance for colon cancer now, over 4 years from last recurrence  and very low risk for recurrence at this point, no plans for repeat CT scans unless she has symptoms. -Current treatment  Eliquis 5 mg BID - no longer taking -Medications previously tried: n/a -  Given cancer in remission, which removes hypercoaguable state, and lack of recurrent DVTs, and no evidence of Afib, it is reasonable to continue without Eliquis  Osteoporosis (Goal prevent fractures) -Not ideally controlled -Last DEXA Scan: 04/02/2019 - recommended fosamax, pt declined d/t fear of side effects  T-Score femoral neck: -2.7  T-Score total hip: -2.3  T-Score lumbar spine: -1.8 -Patient is a candidate for pharmacologic treatment due to T-Score < -2.5 in femoral neck -Current treatment  Calcium-Vitamin D BID -Medications previously tried: none - declined fosamax  -Recommend [phone] units of vitamin D daily. Recommend 1200 mg of calcium daily from dietary and supplemental sources. -Discussed benefits of pharmacological treatment of osteoporosis for prevention of hip fractures -DEXA scheduled 12/15/21; consider Fosamax or Prolia depending on results  Hip pain (Goal: manage pain) -Controlled - pt had a fall in August leading to ED visit, she reports hip pain pre-dates this fall; she has not seen orthopedic for this -Current treatment  Tylenol 500 mg PRN - Appropriate, Effective, Safe, Accessible Ibuprofen 200 mg PRN - Appropriate, Effective, Safe, Accessible -Medications previously tried: n/a  -Recommended to continue current medication  Sleep issues (Goal: improve sleep) -Not ideally controlled - reports sleeping 1-2 hours night, benadryl and melatonin do not work for her -Pt has discussed sleep issues with PCP before, has not tried Rx medication for this -Current treatment  None -Medications previously tried: Benadryl, Pure Zzz's (melatonin) -Recommend trial of trazodone 50 mg 1/2-1 tablet at bedtime  Health Maintenance -Vaccine gaps: Shingrix, Flu -Current therapy:  B  Complex Multivitamin Hair-skin-nails Zinc -Patient is satisfied with current therapy and denies issues -Recommended to continue current medication  Patient Goals/Self-Care Activities Patient will:  - take medications as prescribed as evidenced by patient report and record review focus on medication adherence by routine       Medication Assistance: None required.  Patient affirms current coverage meets needs.  Compliance/Adherence/Medication fill history: Care Gaps: None  Star-Rating Drugs: Rosuvastatin - PDC 94%  Medication Access: Within the past 30 days, how often has patient missed a dose of medication? 0 Is a pillbox or other method used to improve adherence? No  Factors that may affect medication adherence? no barriers identified Are meds synced by current pharmacy? No  Are meds delivered by current pharmacy? No  Does patient experience delays in picking up medications due to transportation concerns? No   [facility] Services Reviewed: Is patient disadvantaged to use [facility]?: No  Current Rx insurance plan: [employer] Name and location of Current pharmacy:  [id number] [id number]: [phone]: [phone] [facility] services reviewed with patient today?: No  Patient requests to transfer care to [facility]?: No  Reason patient declined to change pharmacies: Too few medications to benefit from sync/delivery   Care Plan and Follow Up Patient Decision:  Patient agrees to Care Plan and Follow-up.  Plan: Telephone follow up appointment with care management team member scheduled for:  1 month  LCharlene Brooke PharmD, BAsc Tcg LLCClinical Pharmacist LHaleyvillePrimary Care at [phone]

## 2021-12-01 ENCOUNTER — Telehealth: Payer: Self-pay | Admitting: Pharmacist

## 2021-12-01 MED ORDER — TRAZODONE HCL 50 MG PO TABS: 25.0000 mg | ORAL_TABLET | Freq: Every evening | ORAL | 3 refills | Status: DC | PRN

## 2021-12-01 NOTE — Telephone Encounter (Signed)
Patient complains of sleep difficulties: pt reports she sleeps 1-2 hrs per night and has struggled with this for years; she requests medication to help her sleep - melatonin and benadryl do not help her.  Recommend trial of trazodone 50 mg - 1/2-1 tablet at bedtime. Routing to PCP  - advised pt she may need appt to discuss.

## 2021-12-01 NOTE — Telephone Encounter (Signed)
Caution of sedation  Sent  Thanks

## 2021-12-01 NOTE — Patient Instructions (Signed)
Visit Information  Phone number for Pharmacist: [phone]   Goals Addressed   None     Care Plan : [place]  Updates made by Charlton Haws, RPH since 12/01/2021 12:00 AM     Problem: Hyperlipidemia, Coronary Artery Disease, and Osteoporosis, Sleep disturbance      Long-Range Goal: Disease mgmt   Start Date: 11/30/2021  Expected End Date: 12/01/2022  This Visit's Progress: On track  Priority: High  Note:   Current Barriers:  Suboptimal therapeutic regimen for Osteoporosis Sleep disturbance  Pharmacist Clinical Goal(s):  Patient will adhere to plan to optimize therapeutic regimen for osteoporosis as evidenced by report of adherence to recommended medication management changes contact provider office for questions/concerns as evidenced notation of same in electronic health record through collaboration with PharmD and provider.   Interventions: 1:1 collaboration with Tower, [name], MD regarding development and update of comprehensive plan of care as evidenced by provider attestation and co-signature Inter-disciplinary care team collaboration (see longitudinal plan of care) Comprehensive medication review performed; medication list updated in electronic medical record  Hypertension (BP goal <130/80) -Controlled without medication -Counseled to monitor BP at home periodically  Hyperlipidemia: (LDL goal < 70) -Controlled - LDL 50 (06/2021) at goal -Hx nonocclusive CAD on cath ~2012. -Current treatment: Rosuvastatin 20 mg daily -  Appropriate, Effective, Safe, Accessible -Medications previously tried: atorvastatin  -Educated on Cholesterol goals;  -Recommended to continue current medication  Query clotting disorder  -Controlled -history with Eliquis - per chart review it was started 11/2016 due to acute L leg DVT following hemicolectomy complicated by post-op ileus. Eliquis was continued > 6 months due to active colon cancer/hypercoaguable state. 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 Per most recent oncology notes (05/2020), pt is under surveillance for colon cancer now, over 4 years from last recurrence and very low risk for recurrence at this point, no plans for repeat CT scans unless she has symptoms. -Current treatment  Eliquis 5 mg BID - no longer taking -Medications previously tried: n/a -Given cancer in remission, which removes hypercoaguable state, and lack of recurrent DVTs, and no evidence of Afib, it is reasonable to continue without Eliquis  Osteoporosis (Goal prevent fractures) -Not ideally controlled -Last DEXA Scan: 04/02/2019 - recommended fosamax, pt declined d/t fear of side effects  T-Score femoral neck: -2.7  T-Score total hip: -2.3  T-Score lumbar spine: -1.8 -Patient is a candidate for pharmacologic treatment due to T-Score < -2.5 in femoral neck -Current treatment  Calcium-Vitamin D BID -Medications previously tried: none - declined fosamax  -Recommend [phone] units of vitamin D daily. Recommend 1200 mg of calcium daily from dietary and supplemental sources. -Discussed benefits of pharmacological treatment of osteoporosis for prevention of hip fractures -DEXA scheduled 12/15/21; consider Fosamax or Prolia depending on results  Hip pain (Goal: manage pain) -Controlled - pt had a fall in August leading to ED visit, she reports hip pain pre-dates this fall; she has not seen orthopedic for this -Current treatment  Tylenol 500 mg PRN - Appropriate, Effective, Safe, Accessible Ibuprofen 200 mg PRN - Appropriate, Effective, Safe, Accessible -Medications previously tried: n/a  -Recommended to continue current medication  Sleep issues (Goal: improve sleep) -Not ideally controlled - reports sleeping 1-2 hours night, benadryl and melatonin do not work for her -Pt has discussed sleep issues with PCP before, has not tried Rx medication for this -Current treatment   None -Medications previously tried: Benadryl, Pure Zzz's (  melatonin) -Recommend trial of trazodone 50 mg 1/2-1 tablet at bedtime  Health Maintenance -Vaccine gaps: Shingrix, Flu -Current therapy:  B Complex Multivitamin Hair-skin-nails Zinc -Patient is satisfied with current therapy and denies issues -Recommended to continue current medication  Patient Goals/Self-Care Activities Patient will:  - take medications as prescribed as evidenced by patient report and record review focus on medication adherence by routine       The patient verbalized understanding of instructions, educational materials, and care plan provided today and DECLINED offer to receive copy of patient instructions, educational materials, and care plan.  Telephone follow up appointment with pharmacy team member scheduled for: 1 month  Charlene Brooke, PharmD, [facility] Clinical Pharmacist [place] [facility] [phone]

## 2021-12-15 ENCOUNTER — Ambulatory Visit
Admission: RE | Admit: 2021-12-15 | Discharge: 2021-12-15 | Disposition: A | Discharge status: Home / Self Care | Payer: Medicare HMO | Source: Ambulatory Visit | Attending: Family Medicine | Admitting: Family Medicine

## 2021-12-15 DIAGNOSIS — M8589 Other specified disorders of bone density and structure, multiple sites: Secondary | ICD-10-CM | POA: Diagnosis not present

## 2021-12-15 DIAGNOSIS — Z780 Asymptomatic menopausal state: Secondary | ICD-10-CM | POA: Diagnosis not present

## 2021-12-15 DIAGNOSIS — E2839 Other primary ovarian failure: Secondary | ICD-10-CM

## 2021-12-16 ENCOUNTER — Encounter: Payer: Self-pay | Admitting: *Deleted

## 2021-12-24 ENCOUNTER — Telehealth: Payer: Self-pay

## 2021-12-24 NOTE — Chronic Care Management (AMB) (Signed)
    Chronic Care Management Pharmacy Assistant   Name: [name]  MRN: [id number] DOB: 1940/09/06  Reason for Encounter: Reminder Call   Medications: Outpatient Encounter Medications as of 12/24/2021  Medication Sig   acetaminophen (TYLENOL) 500 MG tablet Take 500 mg by mouth every 6 (six) hours as needed.   b complex vitamins tablet Take 1 tablet by mouth daily.   Calcium Carb-Cholecalciferol (CALCIUM 600 + D PO) Take 1 capsule by mouth in the morning and at bedtime.   ibuprofen (ADVIL) 200 MG tablet Take 200 mg by mouth every 6 (six) hours as needed.   Multiple Vitamin (MULTIVITAMIN WITH MINERALS) TABS tablet Take 1 tablet by mouth daily.    Multiple Vitamins-Minerals (HAIR SKIN AND NAILS FORMULA PO) Take by mouth.   rosuvastatin (CRESTOR) 20 MG tablet TAKE 1 TABLET BY MOUTH EVERY DAY   traZODone (DESYREL) 50 MG tablet Take 0.5-1 tablets (25-50 mg total) by mouth at bedtime as needed for sleep.   Zinc Sulfate (ZINC 15 PO) Take by mouth.   No facility-administered encounter medications on file as of 12/24/2021.    [name] was contacted to remind of upcoming telephone visit with Charlene Brooke on 12/29/21 at 3:30. Patient was reminded to have any blood glucose and blood pressure readings available for review at appointment.   Message was left reminding patient of appointment.  CCM referral has been placed prior to visit?  Yes   Star Rating Drugs: Medication:  Last Fill: Day Supply Rosuvastatin '20mg'$  11/27/21 [place], CPP notified  Avel Sensor, [place]  [phone]

## 2021-12-28 ENCOUNTER — Encounter: Payer: Self-pay | Admitting: Family Medicine

## 2021-12-28 ENCOUNTER — Ambulatory Visit (INDEPENDENT_AMBULATORY_CARE_PROVIDER_SITE_OTHER): Payer: Medicare HMO | Admitting: Family Medicine

## 2021-12-28 VITALS — BP 112/68 | HR 69 | Temp 98.0°F | Ht 63.5 in | Wt 163.0 lb

## 2021-12-28 DIAGNOSIS — N3000 Acute cystitis without hematuria: Secondary | ICD-10-CM

## 2021-12-28 DIAGNOSIS — R300 Dysuria: Secondary | ICD-10-CM | POA: Diagnosis not present

## 2021-12-28 DIAGNOSIS — M25562 Pain in left knee: Secondary | ICD-10-CM

## 2021-12-28 DIAGNOSIS — E7800 Pure hypercholesterolemia, unspecified: Secondary | ICD-10-CM | POA: Diagnosis not present

## 2021-12-28 DIAGNOSIS — M810 Age-related osteoporosis without current pathological fracture: Secondary | ICD-10-CM | POA: Diagnosis not present

## 2021-12-28 DIAGNOSIS — M25569 Pain in unspecified knee: Secondary | ICD-10-CM | POA: Insufficient documentation

## 2021-12-28 DIAGNOSIS — M25561 Pain in right knee: Secondary | ICD-10-CM

## 2021-12-28 DIAGNOSIS — G8929 Other chronic pain: Secondary | ICD-10-CM

## 2021-12-28 LAB — POC URINALSYSI DIPSTICK (AUTOMATED)
Bilirubin, UA: NEGATIVE
Blood, UA: 10
Glucose, UA: NEGATIVE
Ketones, UA: NEGATIVE
Nitrite, UA: NEGATIVE
Protein, UA: NEGATIVE
Spec Grav, UA: >=1.03 — AB (ref 1.010–1.025)
Urobilinogen, UA: 0.2 E.U./dL
pH, UA: 6 (ref 5.0–8.0)

## 2021-12-28 MED ORDER — CEPHALEXIN 500 MG PO CAPS: 500.0000 mg | ORAL_CAPSULE | Freq: Two times a day (BID) | ORAL | 0 refills | Status: DC

## 2021-12-28 NOTE — Assessment & Plan Note (Signed)
Mild pos ua and voiding symptoms Noted h/o IC , this seems different   Culture pending-will call Keflex px 500 mg bid for 7 d Fluids enc  Continues cranberry   Inst to call if symptoms worsen in meantime

## 2021-12-28 NOTE — Assessment & Plan Note (Signed)
Last dexa a little improved this check No fractures, one fall  Does heavy lifting, is generally fit  Enc to continue ca and D

## 2021-12-28 NOTE — Progress Notes (Signed)
Subjective:    Patient ID: [name], female    DOB: [birth year], 81 y.o.   MRN: [id number]  HPI Pt presents for urinary symptoms Also joint pain    Wt Readings from Last 3 Encounters:  12/28/21 163 lb (73.9 kg)  08/31/21 160 lb 9.6 oz (72.8 kg)  06/24/21 152 lb 8 oz (69.2 kg)   28.42 kg/m  Has a past h/o IC   May have uti  At times sensitive/burning with urination and after  Has to get up to urinate a lot at night (freq and urgency)  Drinks a lot of water due to dry mouth   Bladder does not feel overly full  No blood in urine No fever or nausea  Quite tired yesterday-more than usual   Low back pain- after carrying heavy bag of dog food    Taking some cranberry tabs 2 a day   Results for orders placed or performed in visit on 12/28/21  POCT Urinalysis Dipstick (Automated)  Result Value Ref Range   Color, UA Yellow    Clarity, UA Hazy    Glucose, UA Negative Negative   Bilirubin, UA Negative    Ketones, UA Negative    Spec Grav, UA >=1.030 (A) 1.010 - 1.025   Blood, UA 10 Ery/uL    pH, UA 6.0 5.0 - 8.0   Protein, UA Negative Negative   Urobilinogen, UA 0.2 0.2 or 1.0 E.U./dL   Nitrite, UA Negative    Leukocytes, UA Small (1+) (A) Negative     Lab Results  Component Value Date   CREATININE 1.12 (H) 08/31/2021   BUN 21 08/31/2021   NA 141 08/31/2021   K 3.9 08/31/2021   CL 107 08/31/2021   CO2 27 08/31/2021      Takes crestor for hyperlipidemia  Changed to this from atorvastatin due to muscle pain  Lab Results  Component Value Date   CHOL 163 06/24/2021   HDL 74.10 06/24/2021   LDLCALC 50 06/24/2021   LDLDIRECT 138.9 07/05/2012   TRIG 197.0 (H) 06/24/2021   CHOLHDL 2 06/24/2021   Dexa was slt improved  One fall - 9hard fall but did not break a bone)  No fractures   Patient Active Problem List   Diagnosis Date Noted   Knee pain 12/28/2021   Acute cystitis 12/28/2021   Decreased GFR 07/21/2021   Tick bite of chest wall  06/24/2021   Palpitations 06/24/2021   History of colon cancer 04/02/2020   Atherosclerosis of aorta ([place]) 04/02/2020   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   Fall 10/28/2017   Genetic testing 04/25/2017   History of DVT (deep vein thrombosis) 11/24/2016   Cancer of overlapping sites of colon ([place]) 09/23/2016   Estrogen deficiency 07/30/2016   Screening mammogram, encounter for 07/30/2016   Routine general medical examination at a health care facility 07/27/2016   H/O: CVA (cerebrovascular accident) 05/06/2015   HTN (hypertension) 05/06/2015   HLD (hyperlipidemia) 05/06/2015   GERD (gastroesophageal reflux disease) 05/06/2015   CAD (coronary artery disease) 05/06/2015   Seborrheic keratosis 07/05/2012   Herpes simplex 12/29/2010   Coronary artery disease, non-occlusive 10/16/2010   Insomnia 02/02/2010   HAND PAIN, BILATERAL 01/09/2010   Prediabetes 07/29/2009   ALLERGIC RHINITIS 12/04/2008   Vitamin D deficiency 04/30/2008   COLONIC POLYPS 06/28/2007   IBS 03/17/2007   INTERSTITIAL CYSTITIS 03/17/2007   Osteoporosis 03/17/2007   Past Medical History:  Diagnosis Date   Anemia    Arthritis  Astigmatism    both eyes   Cancer ([place])    colon surgery done last chemo tx 01-17-17   Cataract    Colon cancer [facility])    Coronary artery disease, non-occlusive    Dyspnea    more so exertion   Dysrhythmia    skipped beats every now and then   GERD (gastroesophageal reflux disease)    Glaucoma    both eyes right eye worse than left   Hammer toes of both feet    Headache    migraines with aura   Heel spur    both heels and front   HSV (herpes simplex virus) infection    Hyperglycemia    denies   Hyperlipidemia    Hypertension    IBS (irritable bowel syndrome)    IC (interstitial cystitis)    Insomnia disorder related to known organic factor    Osteoporosis    PONV (postoperative nausea and vomiting)    has had more surgeries, and had no problems   Stroke [facility]) 2016    Vitamin D deficiency    Past Surgical History:  Procedure Laterality Date   ABDOMINAL HYSTERECTOMY     1 ovary and womb removed   CARDIAC CATHETERIZATION  7/12   non obst dz -- not sure what yr   CHOLECYSTECTOMY     EUS N/A 02/03/2017   Procedure: UPPER ENDOSCOPIC ULTRASOUND (EUS) LINEAR;  Surgeon: [name], MD;  Location: [facility];  Service: Endoscopy;  Laterality: N/A;   EYE SURGERY Bilateral    ioc for cataracts    LAPAROSCOPIC RIGHT COLECTOMY Right 11/10/2016   Procedure: LAPAROSCOPIC EXTENDED RIGHT COLECTOMY;  Surgeon: [name], MD;  Location: [place];  Service: General;  Laterality: Right;   PORT-A-CATH REMOVAL Left 11/21/2019   Procedure: REMOVAL PORT-A-CATH;  Surgeon: [name], MD;  Location: [place];  Service: General;  Laterality: Left;   PORTACATH PLACEMENT Left 12/22/2016   Procedure: INSERTION PORT-A-CATH;  Surgeon: [name], MD;  Location: [place];  Service: General;  Laterality: Left;   ROTATOR CUFF REPAIR Bilateral    Social History   Tobacco Use   Smoking status: Never   Smokeless tobacco: Never  Vaping Use   Vaping Use: Never used  Substance Use Topics   Alcohol use: No    Alcohol/week: 0.0 standard drinks of alcohol   Drug use: No   Family History  Problem Relation Age of Onset   Alcohol abuse Mother    Heart attack Mother    GER disease Mother    GER disease Father    Alcohol abuse Father    Stroke Father    Tuberculosis Father    Heart attack Father    Aneurysm Father    Mental illness Sister    Depression Sister    Breast cancer Neg Hx    Allergies  Allergen Reactions   Salmon [Fish Allergy] Anaphylaxis    This happens with "CANNED SALMON" only, if eaten uncooked   Ampicillin Nausea Only    Has patient had a PCN reaction causing immediate rash, facial/tongue/throat swelling, SOB or lightheadedness with hypotension: No Has patient had a PCN reaction causing severe rash involving  mucus membranes or skin necrosis: No Has patient had a PCN reaction that required hospitalization: No Has patient had a PCN reaction occurring within the last 10 years: No If all of the above answers are "NO", then may proceed with Cephalosporin use.    Codeine Nausea And  Vomiting   Flagyl [Metronidazole] Nausea And Vomiting    Patient was taking this along with Neomycin and could not differentiate which med triggered the nausea & vomiting   Meperidine Hcl Nausea And Vomiting   Neomycin Nausea And Vomiting    Patient was taking this along with Flagyl and could not differentiate which med triggered the nausea & vomiting   Propoxyphene Hcl Nausea And Vomiting   Sulfa Antibiotics Hives   Tramadol Hcl Nausea And Vomiting   Current Outpatient Medications on File Prior to Visit  Medication Sig Dispense Refill   acetaminophen (TYLENOL) 500 MG tablet Take 500 mg by mouth every 6 (six) hours as needed.     b complex vitamins tablet Take 1 tablet by mouth daily.     Calcium Carb-Cholecalciferol (CALCIUM 600 + D PO) Take 1 capsule by mouth in the morning and at bedtime.     Doxylamine Succinate, Sleep, (SLEEP AID PO) Take by mouth. Bedtime prn     ibuprofen (ADVIL) 200 MG tablet Take 200 mg by mouth every 6 (six) hours as needed.     Multiple Vitamin (MULTIVITAMIN WITH MINERALS) TABS tablet Take 1 tablet by mouth daily.      Multiple Vitamins-Minerals (HAIR SKIN AND NAILS FORMULA PO) Take by mouth.     rosuvastatin (CRESTOR) 20 MG tablet TAKE 1 TABLET BY MOUTH EVERY DAY 90 tablet 3   Zinc Sulfate (ZINC 15 PO) Take by mouth.     No current facility-administered medications on file prior to visit.     Review of Systems  Constitutional:  Negative for activity change, appetite change, fatigue, fever and unexpected weight change.  HENT:  Negative for congestion, ear pain, rhinorrhea, sinus pressure and sore throat.   Eyes:  Negative for pain, redness and visual disturbance.  Respiratory:  Negative  for cough, shortness of breath and wheezing.   Cardiovascular:  Negative for chest pain and palpitations.  Gastrointestinal:  Negative for abdominal pain, blood in stool, constipation and diarrhea.  Endocrine: Negative for polydipsia and polyuria.  Genitourinary:  Positive for dysuria, frequency and urgency. Negative for decreased urine volume and hematuria.  Musculoskeletal:  Negative for arthralgias, back pain and myalgias.  Skin:  Negative for pallor and rash.  Allergic/Immunologic: Negative for environmental allergies.  Neurological:  Negative for dizziness, syncope and headaches.  Hematological:  Negative for adenopathy. Does not bruise/bleed easily.  Psychiatric/Behavioral:  Negative for decreased concentration and dysphoric mood. The patient is not nervous/anxious.        Objective:   Physical Exam Constitutional:      General: She is not in acute distress.    Appearance: Normal appearance. She is well-developed. She is obese. She is not ill-appearing or diaphoretic.  HENT:     Head: Normocephalic and atraumatic.     Mouth/Throat:     Mouth: Mucous membranes are moist.  Eyes:     General: No scleral icterus.    Conjunctiva/sclera: Conjunctivae normal.     Pupils: Pupils are equal, round, and reactive to light.  Neck:     Thyroid: No thyromegaly.     Vascular: No carotid bruit or JVD.  Cardiovascular:     Rate and Rhythm: Normal rate and regular rhythm.     Heart sounds: Normal heart sounds.     No gallop.  Pulmonary:     Effort: Pulmonary effort is normal. No respiratory distress.     Breath sounds: Normal breath sounds. No wheezing or rales.  Abdominal:  General: There is no distension or abdominal bruit.     Palpations: Abdomen is soft. There is no mass.     Tenderness: There is no abdominal tenderness. There is no guarding.     Hernia: No hernia is present.     Comments: No suprapubic tenderness or fullness  No cva tenderness    Musculoskeletal:      Cervical back: Normal range of motion and neck supple.     Right lower leg: No edema.     Left lower leg: No edema.     Comments: Knee -bilateral  No swelling or effusion  No warmth to the touch  Mild crepitus  ROM:  Flex-nl Ext -nl Mcmurray-neg Bounce test -nl  Stability: Anterior drawer nl Lachman exam nl  Tenderness -medial joint line bilaterally   Gait      Lymphadenopathy:     Cervical: No cervical adenopathy.  Skin:    General: Skin is warm and dry.     Coloration: Skin is not pale.     Findings: No rash.  Neurological:     Mental Status: She is alert.     Coordination: Coordination normal.     Deep Tendon Reflexes: Reflexes are normal and symmetric. Reflexes normal.  Psychiatric:        Mood and Affect: Mood normal.           Assessment & Plan:   Problem List Items Addressed This Visit       Musculoskeletal and Integument   Osteoporosis    Last dexa a little improved this check No fractures, one fall  Does heavy lifting, is generally fit  Enc to continue ca and D          Genitourinary   Acute cystitis - Primary    Mild pos ua and voiding symptoms Noted h/o IC , this seems different   Culture pending-will call Keflex px 500 mg bid for 7 d Fluids enc  Continues cranberry   Inst to call if symptoms worsen in meantime         Relevant Orders   Urine Culture     Other   HLD (hyperlipidemia)    Tolerates crestor better than atorvastatin so far  Disc goals for lipids and reasons to control them Rev last labs with pt Rev low sat fat diet in detail Last LDL of 50      Knee pain    Medial joint line Suspect OA  Inst to try voltaren gel otc 1%  Cold compress prn /after activity   Update if not improved        Other Visit Diagnoses     Dysuria       Relevant Orders   POCT Urinalysis Dipstick (Automated) (Completed)

## 2021-12-28 NOTE — Assessment & Plan Note (Signed)
Medial joint line Suspect OA  Inst to try voltaren gel otc 1%  Cold compress prn /after activity   Update if not improved

## 2021-12-28 NOTE — Patient Instructions (Addendum)
You may have some knee arthritis  Try voltaren gel 1% over the counter- to knees topically up to four times daily as needed  Cold compresses may help also   We will send urine for a culture and call you  Take the generic keflex until then  Drink your water    For bone health   Continue calcium and D Make sure your supplement gives you at least 2000 iu of vitamin D per day (if it does not then add some separately)

## 2021-12-28 NOTE — Assessment & Plan Note (Signed)
Tolerates crestor better than atorvastatin so far  Disc goals for lipids and reasons to control them Rev last labs with pt Rev low sat fat diet in detail Last LDL of 50

## 2021-12-29 ENCOUNTER — Telehealth: Payer: Medicare HMO

## 2021-12-29 NOTE — Progress Notes (Deleted)
Chronic Care Management Pharmacy Note  11/30/2021 Name:  [name] MRN:  [id number] DOB:  Jun 04, 1940  Summary: CCM F/U visit -Pt is no longer taking Eliquis - previously PCP and oncologist agreed she can stop Eliquis. Removed from med list. -Osteoporosis: pt has repeat DEXA later this month; previously she declined fosamax due to fear of side effects. Discussed benefits of pharmacological treatment of osteoporosis to prevent fractures -Sleep disturbance: pt reports she sleeps 1-2 hrs per night and has struggled with this for years; she requests medication to help her sleep - melatonin and benadryl do not help her  Recommendations/Changes made from today's visit: -Recommend trazodone 50 mg - 1/2-1 tablet at bedtime -Consider Fosamax or Prolia for osteoporosis, pending results of upcoming DEXA  Plan: -Pharmacist follow up televisit 1 month -PCP annual visit due 06/2022    Subjective: [name] is an 81 y.o. year old female who is a primary patient of Tower, [name], MD.  The CCM team was consulted for assistance with disease management and care coordination needs.    Engaged with patient by telephone for follow up visit in response to provider referral for pharmacy case management and/or care coordination services.   Consent to Services:  The patient was given information about Chronic Care Management services, agreed to services, and gave verbal consent prior to initiation of services.  Please see initial visit note for detailed documentation.   Patient Care Team: Tower, [name], MD as PCP - [name], MD as Consulting Physician (Hematology) [name], MD as Attending Physician (Gastroenterology) [name], MD as Consulting Physician (General Surgery) Charlton Haws, [facility] as Pharmacist (Pharmacist)  Recent office visits: 12/28/21 Dr Glori Bickers OV: acute cystitis - rx keflex.   06/24/21 Dr Glori Bickers OV: annual - pt has hx of stroke and  uncomfortable coming off Eliquis. Has palpitations. Wants to discuss w/ cardiology. Ordered DEXA. Cr up- increase fluids to 64 oz. Recheck BMP 2 weeks w/ urinalysis - pt never responded to messages.  06/16/21 TE w/ PCP and oncologist - OK to stop Eliquis and start aspirin.  Recent consult visits: 08/31/21 Dr Burr Medico (Oncology): completed 5 years surveillance. Follow up PRN.   Hospital visits: 10/07/21 ED visit ([facility]): fall - [place] no DVT. F/U PCP 1 week. F/u with ortho for hip evaluation.   Objective:  Lab Results  Component Value Date   CREATININE 1.12 (H) 08/31/2021   BUN 21 08/31/2021   GFR 42.43 (L) 07/22/2021   EGFR >60 02/21/2017   GFRNONAA 49 (L) 08/31/2021   GFRAA >60 05/03/2019   NA 141 08/31/2021   K 3.9 08/31/2021   CALCIUM 9.5 08/31/2021   CO2 27 08/31/2021   GLUCOSE 80 08/31/2021    Lab Results  Component Value Date/Time   HGBA1C 5.9 06/24/2021 12:29 PM   HGBA1C 6.1 03/26/2020 02:36 PM   GFR 42.43 (L) 07/22/2021 11:15 AM   GFR 41.21 (L) 06/24/2021 12:29 PM    Last diabetic Eye exam: No results found for: "HMDIABEYEEXA"  Last diabetic Foot exam: No results found for: "HMDIABFOOTEX"   Lab Results  Component Value Date   CHOL 163 06/24/2021   HDL 74.10 06/24/2021   LDLCALC 50 06/24/2021   LDLDIRECT 138.9 07/05/2012   TRIG 197.0 (H) 06/24/2021   CHOLHDL 2 06/24/2021       Latest Ref Rng & Units 08/31/2021   11:57 AM 06/24/2021   12:29 PM 06/11/2020   12:49 PM  Hepatic Function  Total Protein  6.5 - 8.1 g/dL 7.0  6.9  6.5   Albumin 3.5 - 5.0 g/dL 4.2  4.3  3.8   AST 15 - 41 U/L _0 ALT 0 - 44 U/L _1 Alk Phosphatase 38 - 126 U/L 57  65  74   Total Bilirubin 0.3 - 1.2 mg/dL 0.6  0.6  0.6     Lab Results  Component Value Date/Time   TSH 3.98 06/24/2021 12:29 PM   TSH 2.84 03/26/2020 02:36 PM       Latest Ref Rng & Units 08/31/2021   12:00 PM 06/24/2021   12:29 PM 12/25/2020   10:26 AM  CBC  WBC 4.0 - 10.5 K/uL 7.5  8.0  7.4   Hemoglobin  12.0 - 15.0 g/dL 12.4  13.0  12.6   Hematocrit 36.0 - 46.0 % 37.7  39.3  38.5   Platelets 150 - 400 K/uL 253  243.0  249     Lab Results  Component Value Date/Time   VD25OH 41.67 06/24/2021 12:29 PM   VD25OH 31.87 03/26/2020 02:36 PM    Clinical ASCVD: Yes  The ASCVD Risk score (Arnett DK, et al., 2019) failed to calculate for the following reasons:   The 2019 ASCVD risk score is only valid for ages 52 to 48       03/27/2021   12:29 PM 03/26/2020   11:35 AM 08/01/2017    3:43 PM  Depression screen PHQ 2/9  Decreased Interest 0 0 1  Down, Depressed, Hopeless 0 0 0  PHQ - 2 Score 0 0 1  Altered sleeping  0 2  Tired, decreased energy  0 0  Change in appetite  0 0  Feeling bad or failure about yourself   0 0  Trouble concentrating  0 0  Moving slowly or fidgety/restless  0 3  Suicidal thoughts  0 0  PHQ-9 Score  0 6  Difficult doing work/chores  Not difficult at all      Social History   Tobacco Use  Smoking Status Never  Smokeless Tobacco Never   BP Readings from Last 3 Encounters:  12/28/21 112/68  10/07/21 (!) 147/71  08/31/21 135/63   Pulse Readings from Last 3 Encounters:  12/28/21 69  10/07/21 68  08/31/21 65   Wt Readings from Last 3 Encounters:  12/28/21 163 lb (73.9 kg)  08/31/21 160 lb 9.6 oz (72.8 kg)  06/24/21 152 lb 8 oz (69.2 kg)   BMI Readings from Last 3 Encounters:  12/28/21 28.42 kg/m  08/31/21 28.00 kg/m  06/24/21 26.59 kg/m    Assessment/Interventions: Review of patient past medical history, allergies, medications, health status, including review of consultants reports, laboratory and other test data, was performed as part of comprehensive evaluation and provision of chronic care management services.   SDOH:  (Social Determinants of Health) assessments and interventions performed: Yes SDOH Interventions    Flowsheet Row Chronic Care Management from 11/30/2021 in [place] at [place] from 03/27/2021 in [place] at [place] from 03/26/2020 in [place] at [place]  SDOH Interventions     Transportation Interventions Intervention Not Indicated -- --  Depression Interventions/Treatment  -- -- CNO7-0 Score <4 Follow-up Not Indicated  Financial Strain Interventions Intervention Not Indicated Other (Comment)  [CCM referral] --      SDOH Screenings   Food Insecurity: No Food Insecurity (03/27/2021)  Housing: Low Risk  (  03/27/2021)  Transportation Needs: No Transportation Needs (12/01/2021)  Alcohol Screen: Low Risk  (03/27/2021)  Depression (PHQ2-9): Low Risk  (03/27/2021)  Financial Resource Strain: Low Risk  (12/01/2021)  Physical Activity: Unknown (03/27/2021)  Social Connections: Socially Isolated (03/27/2021)  Stress: No Stress Concern Present (03/27/2021)  Tobacco Use: Low Risk  (12/28/2021)    [place]  Allergies  Allergen Reactions   Salmon [Fish Allergy] Anaphylaxis    This happens with "CANNED SALMON" only, if eaten uncooked   Ampicillin Nausea Only    Has patient had a PCN reaction causing immediate rash, facial/tongue/throat swelling, SOB or lightheadedness with hypotension: No Has patient had a PCN reaction causing severe rash involving mucus membranes or skin necrosis: No Has patient had a PCN reaction that required hospitalization: No Has patient had a PCN reaction occurring within the last 10 years: No If all of the above answers are "NO", then may proceed with Cephalosporin use.    Codeine Nausea And Vomiting   Flagyl [Metronidazole] Nausea And Vomiting    Patient was taking this along with Neomycin and could not differentiate which med triggered the nausea & vomiting   Meperidine Hcl Nausea And Vomiting   Neomycin Nausea And Vomiting    Patient was taking this along with Flagyl and could not differentiate which med triggered the nausea & vomiting   Propoxyphene Hcl Nausea And Vomiting   Sulfa Antibiotics Hives   Tramadol Hcl Nausea And  Vomiting    Medications Reviewed Today     Reviewed by Abner Greenspan, MD (Physician) on 12/28/21 at 1441  Med List Status: <None>   Medication Order Taking? Sig Documenting Provider Last Dose Status Informant  acetaminophen (TYLENOL) 500 MG tablet [id number] Yes Take 500 mg by mouth every 6 (six) hours as needed. [provider] Taking Active   b complex vitamins tablet [id number] Yes Take 1 tablet by mouth daily. [provider] Taking Active Self  Calcium Carb-Cholecalciferol (CALCIUM 600 + D PO) [id number] Yes Take 1 capsule by mouth in the morning and at bedtime. [provider] Taking Active   Doxylamine Succinate, Sleep, (SLEEP AID PO) [id number] Yes Take by mouth. Bedtime prn [provider] Taking Active   ibuprofen (ADVIL) 200 MG tablet [id number] Yes Take 200 mg by mouth every 6 (six) hours as needed. [provider] Taking Active   Multiple Vitamin (MULTIVITAMIN WITH MINERALS) TABS tablet [id number] Yes Take 1 tablet by mouth daily.  [provider] Taking Active Self  Multiple Vitamins-Minerals (HAIR SKIN AND NAILS FORMULA PO) [id number] Yes Take by mouth. [provider] Taking Active   rosuvastatin (CRESTOR) 20 MG tablet [id number] Yes TAKE 1 TABLET BY MOUTH EVERY DAY Tower, [name], MD Taking Active   Zinc Sulfate (ZINC 15 PO) [id number] Yes Take by mouth. 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[place]) 06/30/2018   Fall 10/28/2017   Genetic testing 04/25/2017   History of DVT (deep vein thrombosis) 11/24/2016   Cancer of overlapping sites of colon ([place]) 09/23/2016   Estrogen deficiency 07/30/2016    Screening mammogram, encounter for 07/30/2016   Routine general  medical examination at a health care facility 07/27/2016   H/O: CVA (cerebrovascular accident) 05/06/2015   HTN (hypertension) 05/06/2015   HLD (hyperlipidemia) 05/06/2015   GERD (gastroesophageal reflux disease) 05/06/2015   CAD (coronary artery disease) 05/06/2015   Seborrheic keratosis 07/05/2012   Herpes simplex 12/29/2010   Coronary artery disease, non-occlusive 10/16/2010   Insomnia 02/02/2010   HAND PAIN, BILATERAL 01/09/2010   Prediabetes 07/29/2009   ALLERGIC RHINITIS 12/04/2008   Vitamin D deficiency 04/30/2008   COLONIC POLYPS 06/28/2007   IBS 03/17/2007   INTERSTITIAL CYSTITIS 03/17/2007   Osteoporosis 03/17/2007    Immunization History  Administered Date(s) Administered   COVID-19, mRNA, vaccine(Comirnaty)12 years and older 12/22/2021   Fluad Quad(high Dose 65+) 12/04/2018, 11/15/2019   Influenza Split 12/29/2010, 11/18/2011   Influenza, High Dose Seasonal PF 12/29/2016, 01/14/2018   Influenza,inj,Quad PF,6+ Mos 03/02/2013   Influenza-Unspecified 11/22/2013, 11/23/2015   PFIZER(Purple Top)SARS-COV-2 Vaccination 06/02/2019, 06/23/2019, 03/07/2020   Pneumococcal Conjugate-13 07/28/2016   Pneumococcal Polysaccharide-23 04/30/2008   Td 04/30/2008, 04/17/2014   Zoster, Live 06/11/2010    Conditions to be addressed/monitored:  Hyperlipidemia, Coronary Artery Disease, and Osteoporosis, Sleep disturbance  There are no care plans that you recently modified to display for this patient.     Medication Assistance: None required.  Patient affirms current coverage meets needs.  Compliance/Adherence/Medication fill history: Care Gaps: None  Star-Rating Drugs: Rosuvastatin - PDC 94%  Medication Access: Within the past 30 days, how often has patient missed a dose of medication? 0 Is a pillbox or other method used to improve adherence? No  Factors that may affect medication adherence? no barriers  identified Are meds synced by current pharmacy? No  Are meds delivered by current pharmacy? No  Does patient experience delays in picking up medications due to transportation concerns? No   [facility] Services Reviewed: Is patient disadvantaged to use [facility]?: No  Current Rx insurance plan: [employer] Name and location of Current pharmacy:  [id number] [place] [address]Phone: [phone]: [phone] [facility] services reviewed with patient today?: No  Patient requests to transfer care to [facility]?: No  Reason patient declined to change pharmacies: Too few medications to benefit from sync/delivery   Care Plan and Follow Up Patient Decision:  Patient agrees to Care Plan and Follow-up.  Plan: Telephone follow up appointment with care management team member scheduled for:  1 month  LCharlene Brooke PharmD, BCACP Clinical Pharmacist [phone]   Current Barriers:  Suboptimal therapeutic regimen for Osteoporosis Sleep disturbance  Pharmacist Clinical Goal(s):  Patient will adhere to plan to optimize therapeutic regimen for osteoporosis as evidenced by report of adherence to recommended medication management changes contact provider office for questions/concerns as evidenced notation of same in electronic health record through collaboration with PharmD and provider.   Interventions: 1:1 collaboration with Tower, MWynelle [name] MD regarding development and update of comprehensive plan of care as evidenced by provider attestation and co-signature Inter-disciplinary care team collaboration (see longitudinal plan of care) Comprehensive medication review performed; medication list updated in electronic medical record  Hypertension (BP goal <130/80) -Controlled without medication -Counseled to monitor BP at home periodically  Hyperlipidemia: (LDL goal  < 70) -Controlled - LDL 50 (06/2021) at goal -Hx nonocclusive CAD on cath ~2012. -Current treatment: Rosuvastatin 20 mg daily -  Appropriate, Effective, Safe, Accessible -Medications previously tried: atorvastatin  -Educated on Cholesterol goals;  -Recommended to continue current  medication  Query clotting disorder  -Controlled -history with Eliquis - per chart review it was started 11/2016 due to acute L leg DVT following hemicolectomy complicated by post-op ileus. Eliquis was continued > 6 months due to active colon cancer/hypercoaguable state. 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 Per most recent oncology notes (05/2020), pt is under surveillance for colon cancer now, over 4 years from last recurrence and very low risk for recurrence at this point, no plans for repeat CT scans unless she has symptoms. -Current treatment  Eliquis 5 mg BID - no longer taking -Medications previously tried: n/a -Given cancer in remission, which removes hypercoaguable state, and lack of recurrent DVTs, and no evidence of Afib, it is reasonable to continue without Eliquis  Osteoporosis (Goal prevent fractures) -Not ideally controlled  - previously recommended fosamax, pt declined d/t fear of side effects -Last DEXA Scan: 04/02/2019 > 12/15/21   T-Score femoral neck: -2.7 > -2.4  T-Score total hip: -2.3 > -2.2  T-Score lumbar spine: -1.8 > -1.1  10-year risk of major osteoporotic fracture: 44.2%  10-year risk of hip fracture: 30.7% -Patient is not a candidate for pharmacologic treatment -Current treatment  Calcium-Vitamin D BID - Appropriate, Effective, Safe, Accessible -Medications previously tried: none - declined fosamax  -Recommend [id number] units of vitamin D daily. Recommend 1200 mg of calcium daily from dietary and supplemental sources. -Discussed benefits of pharmacological treatment of osteoporosis for prevention of hip fractures -DEXA scheduled  12/15/21; consider Fosamax or Prolia depending on results  Hip pain (Goal: manage pain) -Controlled - pt had a fall in August leading to ED visit, she reports hip pain pre-dates this fall; she has not seen orthopedic for this -Current treatment  Tylenol 500 mg PRN - Appropriate, Effective, Safe, Accessible Ibuprofen 200 mg PRN - Appropriate, Effective, Safe, Accessible -Medications previously tried: n/a  -Recommended to continue current medication  Sleep issues (Goal: improve sleep) -Not ideally controlled - reports sleeping 1-2 hours night, benadryl and melatonin do not work for her -Pt has discussed sleep issues with PCP before, has not tried Rx medication for this -Current treatment  Doxylamine -Medications previously tried: Benadryl, Pure Zzz's (melatonin), trazodone -Recommend trial of trazodone 50 mg 1/2-1 tablet at bedtime  Health Maintenance -Vaccine gaps: Shingrix, Flu -Current therapy:  B Complex Multivitamin Hair-skin-nails Zinc -Patient is satisfied with current therapy and denies issues -Recommended to continue current medication  Patient Goals/Self-Care Activities Patient will:  - take medications as prescribed as evidenced by patient report and record review focus on medication adherence by routine

## 2021-12-31 ENCOUNTER — Telehealth: Payer: Self-pay | Admitting: *Deleted

## 2021-12-31 LAB — URINE CULTURE
MICRO NUMBER:: 14148853
SPECIMEN QUALITY:: ADEQUATE

## 2021-12-31 NOTE — Telephone Encounter (Signed)
Left VM requesting pt to call the office back 

## 2021-12-31 NOTE — Telephone Encounter (Signed)
-----   Message from Abner Greenspan, MD sent at 12/31/2021 11:55 AM EST ----- Her uti should be sensitive to keflex Please let me know if her symptoms are not improving

## 2022-01-05 ENCOUNTER — Other Ambulatory Visit: Payer: Self-pay | Admitting: Family Medicine

## 2022-01-12 ENCOUNTER — Telehealth: Payer: Self-pay | Admitting: Family Medicine

## 2022-01-12 NOTE — Telephone Encounter (Signed)
Pt said after finishing abx her sxs cleared up some but not completely and now all her UTI sxs are back. Appt scheduled with Catalina Antigua, NP tomorrow since PCP is out of the office.

## 2022-01-12 NOTE — Telephone Encounter (Signed)
Pt called returning Sh[place]'s missed call regarding lab results. Pt requested a call back @ [phone]

## 2022-01-13 ENCOUNTER — Ambulatory Visit: Payer: Medicare HMO | Admitting: Nurse Practitioner

## 2022-03-12 ENCOUNTER — Encounter: Payer: Self-pay | Admitting: Hematology

## 2022-03-15 ENCOUNTER — Encounter: Payer: Self-pay | Admitting: Family Medicine

## 2022-03-15 ENCOUNTER — Ambulatory Visit (INDEPENDENT_AMBULATORY_CARE_PROVIDER_SITE_OTHER): Payer: Medicare HMO | Admitting: Family Medicine

## 2022-03-15 VITALS — BP 118/80 | HR 73 | Temp 97.7°F | Ht 63.5 in | Wt 166.4 lb

## 2022-03-15 DIAGNOSIS — I8391 Asymptomatic varicose veins of right lower extremity: Secondary | ICD-10-CM

## 2022-03-15 DIAGNOSIS — M2041 Other hammer toe(s) (acquired), right foot: Secondary | ICD-10-CM | POA: Diagnosis not present

## 2022-03-15 DIAGNOSIS — S8011XA Contusion of right lower leg, initial encounter: Secondary | ICD-10-CM

## 2022-03-15 DIAGNOSIS — M2042 Other hammer toe(s) (acquired), left foot: Secondary | ICD-10-CM

## 2022-03-15 DIAGNOSIS — T148XXA Other injury of unspecified body region, initial encounter: Secondary | ICD-10-CM | POA: Insufficient documentation

## 2022-03-15 NOTE — Assessment & Plan Note (Addendum)
Old bruise/yellow and evolving on R med leg Unsure what trauma she had  Takes baby asa daily   Reassured nothing to do if not hurting Will monitor   Unless otherwise inst can take asa down to 81 mg once daily

## 2022-03-15 NOTE — Assessment & Plan Note (Signed)
Some compressible/non tender varicosities on R medial shin without skin change  Few spider veins also  Old bruise likely due to trauma she does not remember  Inst to try support socks to knee during the day as tolerated for veins Ecchymosis should continue to resolve Recommend more supportive shoes for her foot/leg issues (wearing broken down sandals today) Update if not starting to improve in a week or if worsening

## 2022-03-15 NOTE — Assessment & Plan Note (Signed)
Chronic Affecting gait  Has seen pod in past  Her shoes are broken down/ boots may be also  May benefit from a better shoe or orthotic She does not internet shop Given # /name of the shoe market in [place] to check out-they do good fittings Interested in a comfortable athletic shoe

## 2022-03-15 NOTE — Progress Notes (Signed)
Subjective:    Patient ID: [name], female    DOB: 1940/04/04, 82 y.o.   MRN: [id number]  HPI Pt presents for spot on R leg/ varicose vein  Bruise Foot pain   Wt Readings from Last 3 Encounters:  03/15/22 166 lb 6 oz (75.5 kg)  12/28/21 163 lb (73.9 kg)  08/31/21 160 lb 9.6 oz (72.8 kg)   29.01 kg/m  Has area on R leg that concerns her No trauma  4 months  ? If vein  Feels like a lump  No pain but sensitive to the touch   Wore her daughter's supp hose a few times  Usually wears boots    Takes baby asa bid   Had a stroke in past  Wants to know if it is safe to use voltaren gel on joints   Very active Cares for horses/stable work   Patient Active Problem List   Diagnosis Date Noted   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   Bruise 03/15/2022   Knee pain 12/28/2021   Acute cystitis 12/28/2021   Decreased GFR 07/21/2021   Tick bite of chest wall 06/24/2021   Palpitations 06/24/2021   History of colon cancer 04/02/2020   Atherosclerosis of aorta ([place]) 04/02/2020   Blood clotting disorder ([place]) 06/30/2018   Fall 10/28/2017   Genetic testing 04/25/2017   History of DVT (deep vein thrombosis) 11/24/2016   Cancer of overlapping sites of colon ([place]) 09/23/2016   Estrogen deficiency 07/30/2016   Screening mammogram, encounter for 07/30/2016   Routine general medical examination at a health care facility 07/27/2016   H/O: CVA (cerebrovascular accident) 05/06/2015   HTN (hypertension) 05/06/2015   HLD (hyperlipidemia) 05/06/2015   GERD (gastroesophageal reflux disease) 05/06/2015   CAD (coronary artery disease) 05/06/2015   Seborrheic keratosis 07/05/2012   Herpes simplex 12/29/2010   Coronary artery disease, non-occlusive 10/16/2010   Insomnia 02/02/2010   HAND PAIN, BILATERAL 01/09/2010   Prediabetes 07/29/2009   ALLERGIC RHINITIS 12/04/2008   Vitamin D deficiency 04/30/2008   COLONIC POLYPS 06/28/2007   IBS 03/17/2007    INTERSTITIAL CYSTITIS 03/17/2007   Osteoporosis 03/17/2007   Past Medical History:  Diagnosis Date   Anemia    Arthritis    Astigmatism    both eyes   Cancer ([place])    colon surgery done last chemo tx 01-17-17   Cataract    Colon cancer ([place])    Coronary artery disease, non-occlusive    Dyspnea    more so exertion   Dysrhythmia    skipped beats every now and then   GERD (gastroesophageal reflux disease)    Glaucoma    both eyes right eye worse than left   Hammer toes of both feet    Headache    migraines with aura   Heel spur    both heels and front   HSV (herpes simplex virus) infection    Hyperglycemia    denies   Hyperlipidemia    Hypertension    IBS (irritable bowel syndrome)    IC (interstitial cystitis)    Insomnia disorder related to known organic factor    Osteoporosis    PONV (postoperative nausea and vomiting)    has had more surgeries, and had no problems   Stroke [facility]) 2016   Vitamin D deficiency    Past Surgical History:  Procedure Laterality Date   ABDOMINAL HYSTERECTOMY     1 ovary and womb removed   CARDIAC CATHETERIZATION  7/12  non obst dz -- not sure what yr   CHOLECYSTECTOMY     EUS N/A 02/03/2017   Procedure: UPPER ENDOSCOPIC ULTRASOUND (EUS) LINEAR;  Surgeon: [name], MD;  Location: [facility];  Service: Endoscopy;  Laterality: N/A;   EYE SURGERY Bilateral    ioc for cataracts    LAPAROSCOPIC RIGHT COLECTOMY Right 11/10/2016   Procedure: LAPAROSCOPIC EXTENDED RIGHT COLECTOMY;  Surgeon: [name], MD;  Location: [place];  Service: General;  Laterality: Right;   PORT-A-CATH REMOVAL Left 11/21/2019   Procedure: REMOVAL PORT-A-CATH;  Surgeon: [name], MD;  Location: [place];  Service: General;  Laterality: Left;   PORTACATH PLACEMENT Left 12/22/2016   Procedure: INSERTION PORT-A-CATH;  Surgeon: [name], MD;  Location: [place];  Service: General;  Laterality: Left;   ROTATOR CUFF  REPAIR Bilateral    Social History   Tobacco Use   Smoking status: Never   Smokeless tobacco: Never  Vaping Use   Vaping Use: Never used  Substance Use Topics   Alcohol use: No    Alcohol/week: 0.0 standard drinks of alcohol   Drug use: No   Family History  Problem Relation Age of Onset   Alcohol abuse Mother    Heart attack Mother    GER disease Mother    GER disease Father    Alcohol abuse Father    Stroke Father    Tuberculosis Father    Heart attack Father    Aneurysm Father    Mental illness Sister    Depression Sister    Breast cancer Neg Hx    Allergies  Allergen Reactions   Salmon [Fish Allergy] Anaphylaxis    This happens with "CANNED SALMON" only, if eaten uncooked   Ampicillin Nausea Only    Has patient had a PCN reaction causing immediate rash, facial/tongue/throat swelling, SOB or lightheadedness with hypotension: No Has patient had a PCN reaction causing severe rash involving mucus membranes or skin necrosis: No Has patient had a PCN reaction that required hospitalization: No Has patient had a PCN reaction occurring within the last 10 years: No If all of the above answers are "NO", then may proceed with Cephalosporin use.    Codeine Nausea And Vomiting   Flagyl [Metronidazole] Nausea And Vomiting    Patient was taking this along with Neomycin and could not differentiate which med triggered the nausea & vomiting   Meperidine Hcl Nausea And Vomiting   Neomycin Nausea And Vomiting    Patient was taking this along with Flagyl and could not differentiate which med triggered the nausea & vomiting   Propoxyphene Hcl Nausea And Vomiting   Sulfa Antibiotics Hives   Tramadol Hcl Nausea And Vomiting   Current Outpatient Medications on File Prior to Visit  Medication Sig Dispense Refill   acetaminophen (TYLENOL) 500 MG tablet Take 500 mg by mouth every 6 (six) hours as needed.     b complex vitamins tablet Take 1 tablet by mouth daily.     Calcium  Carb-Cholecalciferol (CALCIUM 600 + D PO) Take 1 capsule by mouth in the morning and at bedtime.     Doxylamine Succinate, Sleep, (SLEEP AID PO) Take by mouth. Bedtime prn     ibuprofen (ADVIL) 200 MG tablet Take 200 mg by mouth every 6 (six) hours as needed.     Multiple Vitamin (MULTIVITAMIN WITH MINERALS) TABS tablet Take 1 tablet by mouth daily.      Multiple Vitamins-Minerals (HAIR SKIN AND NAILS FORMULA  PO) Take by mouth.     rosuvastatin (CRESTOR) 20 MG tablet TAKE 1 TABLET BY MOUTH EVERY DAY 90 tablet 3   Zinc Sulfate (ZINC 15 PO) Take by mouth.     No current facility-administered medications on file prior to visit.    Review of Systems  Constitutional:  Negative for activity change, appetite change, fatigue, fever and unexpected weight change.  HENT:  Negative for congestion, ear pain, rhinorrhea, sinus pressure and sore throat.   Eyes:  Negative for pain, redness and visual disturbance.  Respiratory:  Negative for cough, shortness of breath and wheezing.   Cardiovascular:  Negative for chest pain, palpitations and leg swelling.       Varicose veins  Gastrointestinal:  Negative for abdominal pain, blood in stool, constipation and diarrhea.  Endocrine: Negative for polydipsia and polyuria.  Genitourinary:  Negative for dysuria, frequency and urgency.  Musculoskeletal:  Negative for arthralgias, back pain and myalgias.       Foot pain Hammer toes  Knee/hip pain due to gait change   Skin:  Negative for pallor, rash and wound.  Allergic/Immunologic: Negative for environmental allergies.  Neurological:  Negative for dizziness, syncope and headaches.  Hematological:  Negative for adenopathy. Does not bruise/bleed easily.  Psychiatric/Behavioral:  Negative for decreased concentration and dysphoric mood. The patient is not nervous/anxious.        Objective:   Physical Exam Constitutional:      General: She is not in acute distress.    Appearance: Normal appearance. She is  well-developed and normal weight.  HENT:     Head: Normocephalic and atraumatic.     Mouth/Throat:     Mouth: Mucous membranes are moist.  Eyes:     General: No scleral icterus.    Conjunctiva/sclera: Conjunctivae normal.     Pupils: Pupils are equal, round, and reactive to light.  Neck:     Thyroid: No thyromegaly.     Vascular: No carotid bruit or JVD.  Cardiovascular:     Rate and Rhythm: Normal rate and regular rhythm.     Heart sounds: Normal heart sounds.     No gallop.     Comments: LE varicosities Large and spider Pulmonary:     Effort: Pulmonary effort is normal. No respiratory distress.     Breath sounds: Normal breath sounds. No wheezing or rales.  Abdominal:     General: There is no distension or abdominal bruit.     Palpations: Abdomen is soft.  Musculoskeletal:     Cervical back: Normal range of motion and neck supple.     Right lower leg: No edema.     Left lower leg: No edema.     Comments: Compressible ropey varicose veins -worse in med right LE Non tender No mass or palp cord   Some surrounding patchy yellow (old) ecchymosis in evolution   No edema     Pronounced hammer toes bilat feet with high arch  Lymphadenopathy:     Cervical: No cervical adenopathy.  Skin:    General: Skin is warm and dry.     Coloration: Skin is not jaundiced or pale.     Findings: Bruising and erythema present. No rash.  Neurological:     Mental Status: She is alert.     Sensory: No sensory deficit.     Coordination: Coordination normal.     Deep Tendon Reflexes: Reflexes are normal and symmetric. Reflexes normal.  Psychiatric:        Mood and  Affect: Mood normal.           Assessment & Plan:   Problem List Items Addressed This Visit       Cardiovascular and Mediastinum   Varicose veins of right lower extremity - Primary    Some compressible/non tender varicosities on R medial shin without skin change  Few spider veins also  Old bruise likely due to trauma  she does not remember  Inst to try support socks to knee during the day as tolerated for veins Ecchymosis should continue to resolve Recommend more supportive shoes for her foot/leg issues (wearing broken down sandals today) Update if not starting to improve in a week or if worsening          Musculoskeletal and Integument   Hammer toes of both feet    Chronic Affecting gait  Has seen pod in past  Her shoes are broken down/ boots may be also  May benefit from a better shoe or orthotic She does not internet shop Given # /name of the shoe market in [place] to check out-they do good fittings Interested in a comfortable athletic shoe        Other   Bruise    Old bruise/yellow and evolving on R med leg Unsure what trauma she had  Takes baby asa daily   Reassured nothing to do if not hurting Will monitor   Unless otherwise inst can take asa down to 81 mg once daily

## 2022-03-15 NOTE — Patient Instructions (Addendum)
Get some knee high socks with support / support socks  This will help your varicose veins Wear them during the day (not at night)   Wear supportive shoes also   The bruise should continue to get better   If symptoms worsen or you develop pain or other symptoms   Don't take aspirin 81 mg more than one daily   Voltaren gel is ok for joint pain as needed

## 2022-04-12 ENCOUNTER — Telehealth: Payer: Self-pay | Admitting: Family Medicine

## 2022-04-12 NOTE — Telephone Encounter (Signed)
Patient called and said she has been sick for a week and that she tested positive for covid yesterday and wanted Dr Glori Bickers to send her medication without an appt. I let her know that an appt is usually need to have a new med sent in. She scheduled for tomorrow at 12:30, She said she is unable to do video visit but will wear a mask.

## 2022-04-12 NOTE — Telephone Encounter (Signed)
I will see her then  Please give ER precautions

## 2022-04-12 NOTE — Telephone Encounter (Signed)
Pt didn't answer call so left VM giving pt ER precautions

## 2022-04-13 ENCOUNTER — Encounter: Payer: Self-pay | Admitting: Family Medicine

## 2022-04-13 ENCOUNTER — Ambulatory Visit (INDEPENDENT_AMBULATORY_CARE_PROVIDER_SITE_OTHER): Payer: Medicare HMO | Admitting: Family Medicine

## 2022-04-13 VITALS — BP 116/62 | HR 88 | Temp 98.0°F | Ht 63.5 in | Wt 160.0 lb

## 2022-04-13 DIAGNOSIS — U071 COVID-19: Secondary | ICD-10-CM

## 2022-04-13 NOTE — Patient Instructions (Signed)
Take care of yourself  It is too late in the course for an anti viral medicine  So for now treat your symptoms    Drink fluids and rest  mucinex DM is good for cough and congestion  Nasal saline for congestion as needed  Tylenol for fever or pain or headache  Please alert [place] if symptoms worsen (if severe or short of breath please go to the ER)    Your vital signs and oxygen are reassuring   Isolate yourself until you feel better Then mask for 10 more days    Update if not starting to improve in a week or if worsening   If you get short of breath or wheezy or sinus pain = please let us know

## 2022-04-13 NOTE — Progress Notes (Signed)
Subjective:    Patient ID: [name], female    DOB: December 27, 1940, 82 y.o.   MRN: [id number]  HPI Pt presents for uri symptoms with positive covid test   Wt Readings from Last 3 Encounters:  04/13/22 160 lb (72.6 kg)  03/15/22 166 lb 6 oz (75.5 kg)  12/28/21 163 lb (73.9 kg)   27.90 kg/m  Vitals:   04/13/22 1224  BP: 116/62  Pulse: 88  Temp: 98 F (36.7 C)  SpO2: 96%     Has had symptoms for a week at least  Gets worse at night  Fatigue  Congestion  Clear mucous to white  Some headache- slight comes and goes  Cough : non productive  No rattling  No wheezing but occ gets a snarly noise from her nose   Some fever she thinks / no thermometer  Gets chills around her mid section  Occ achey   Otc Tylenol  [facility] quil   Patient Active Problem List   Diagnosis Date Noted   COVID-19 04/13/2022   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   Bruise 03/15/2022   Hammer toes of both feet 03/15/2022   Knee pain 12/28/2021   Decreased GFR 07/21/2021   Palpitations 06/24/2021   History of colon cancer 04/02/2020   Atherosclerosis of aorta ([place]) 04/02/2020   Blood clotting disorder ([place]) 06/30/2018   Fall 10/28/2017   Genetic testing 04/25/2017   History of DVT (deep vein thrombosis) 11/24/2016   Cancer of overlapping sites of colon ([place]) 09/23/2016   Estrogen deficiency 07/30/2016   Screening mammogram, encounter for 07/30/2016   Routine general medical examination at a health care facility 07/27/2016   H/O: CVA (cerebrovascular accident) 05/06/2015   HTN (hypertension) 05/06/2015   HLD (hyperlipidemia) 05/06/2015   GERD (gastroesophageal reflux disease) 05/06/2015   CAD (coronary artery disease) 05/06/2015   Seborrheic keratosis 07/05/2012   Herpes simplex 12/29/2010   Coronary artery disease, non-occlusive 10/16/2010   Insomnia 02/02/2010   HAND PAIN, BILATERAL 01/09/2010   Prediabetes 07/29/2009   ALLERGIC RHINITIS 12/04/2008    Vitamin D deficiency 04/30/2008   COLONIC POLYPS 06/28/2007   IBS 03/17/2007   INTERSTITIAL CYSTITIS 03/17/2007   Osteoporosis 03/17/2007   Past Medical History:  Diagnosis Date   Anemia    Arthritis    Astigmatism    both eyes   Cancer ([place])    colon surgery done last chemo tx 01-17-17   Cataract    Colon cancer ([place])    Coronary artery disease, non-occlusive    Dyspnea    more so exertion   Dysrhythmia    skipped beats every now and then   GERD (gastroesophageal reflux disease)    Glaucoma    both eyes right eye worse than left   Hammer toes of both feet    Headache    migraines with aura   Heel spur    both heels and front   HSV (herpes simplex virus) infection    Hyperglycemia    denies   Hyperlipidemia    Hypertension    IBS (irritable bowel syndrome)    IC (interstitial cystitis)    Insomnia disorder related to known organic factor    Osteoporosis    PONV (postoperative nausea and vomiting)    has had more surgeries, and had no problems   Stroke ([place]) 2016   Vitamin D deficiency    Past Surgical History:  Procedure Laterality Date   ABDOMINAL HYSTERECTOMY  1 ovary and womb removed   CARDIAC CATHETERIZATION  7/12   non obst dz -- not sure what yr   CHOLECYSTECTOMY     EUS N/A 02/03/2017   Procedure: UPPER ENDOSCOPIC ULTRASOUND (EUS) LINEAR;  Surgeon: [name], MD;  [facility];  Service: Endoscopy;  Laterality: N/A;   EYE SURGERY Bilateral    ioc for cataracts    LAPAROSCOPIC RIGHT COLECTOMY Right 11/10/2016   Procedure: LAPAROSCOPIC EXTENDED RIGHT COLECTOMY;  Surgeon: [name], MD;  Location: [place];  Service: General;  Laterality: Right;   PORT-A-CATH REMOVAL Left 11/21/2019   Procedure: REMOVAL PORT-A-CATH;  Surgeon: [name], MD;  Location: [place];  Service: General;  Laterality: Left;   PORTACATH PLACEMENT Left 12/22/2016   Procedure: INSERTION PORT-A-CATH;  Surgeon: [name], MD;  Location:  [place];  Service: General;  Laterality: Left;   ROTATOR CUFF REPAIR Bilateral    Social History   Tobacco Use   Smoking status: Never   Smokeless tobacco: Never  Vaping Use   Vaping Use: Never used  Substance Use Topics   Alcohol use: No    Alcohol/week: 0.0 standard drinks of alcohol   Drug use: No   Family History  Problem Relation Age of Onset   Alcohol abuse Mother    Heart attack Mother    GER disease Mother    GER disease Father    Alcohol abuse Father    Stroke Father    Tuberculosis Father    Heart attack Father    Aneurysm Father    Mental illness Sister    Depression Sister    Breast cancer Neg Hx    Allergies  Allergen Reactions   Salmon [Fish Allergy] Anaphylaxis    This happens with "CANNED SALMON" only, if eaten uncooked   Ampicillin Nausea Only    Has patient had a PCN reaction causing immediate rash, facial/tongue/throat swelling, SOB or lightheadedness with hypotension: No Has patient had a PCN reaction causing severe rash involving mucus membranes or skin necrosis: No Has patient had a PCN reaction that required hospitalization: No Has patient had a PCN reaction occurring within the last 10 years: No If all of the above answers are "NO", then may proceed with Cephalosporin use.    Codeine Nausea And Vomiting   Flagyl [Metronidazole] Nausea And Vomiting    Patient was taking this along with Neomycin and could not differentiate which med triggered the nausea & vomiting   Meperidine Hcl Nausea And Vomiting   Neomycin Nausea And Vomiting    Patient was taking this along with Flagyl and could not differentiate which med triggered the nausea & vomiting   Propoxyphene Hcl Nausea And Vomiting   Sulfa Antibiotics Hives   Tramadol Hcl Nausea And Vomiting   Current Outpatient Medications on File Prior to Visit  Medication Sig Dispense Refill   acetaminophen (TYLENOL) 500 MG tablet Take 500 mg by mouth every 6 (six) hours as needed.     b  complex vitamins tablet Take 1 tablet by mouth daily.     Calcium Carb-Cholecalciferol (CALCIUM 600 + D PO) Take 1 capsule by mouth in the morning and at bedtime.     Doxylamine Succinate, Sleep, (SLEEP AID PO) Take by mouth. Bedtime prn     ibuprofen (ADVIL) 200 MG tablet Take 200 mg by mouth every 6 (six) hours as needed.     Multiple Vitamin (MULTIVITAMIN WITH MINERALS) TABS tablet Take 1 tablet by mouth  daily.      Multiple Vitamins-Minerals (HAIR SKIN AND NAILS FORMULA PO) Take by mouth.     Zinc Sulfate (ZINC 15 PO) Take by mouth.     No current facility-administered medications on file prior to visit.     Review of Systems  Constitutional:  Positive for appetite change and fatigue. Negative for fever.  HENT:  Positive for congestion, postnasal drip, rhinorrhea, sinus pressure, sneezing and sore throat. Negative for ear pain.   Eyes:  Negative for pain and discharge.  Respiratory:  Positive for cough. Negative for shortness of breath, wheezing and stridor.   Cardiovascular:  Negative for chest pain.  Gastrointestinal:  Negative for diarrhea, nausea and vomiting.  Genitourinary:  Negative for frequency, hematuria and urgency.  Musculoskeletal:  Negative for arthralgias and myalgias.  Skin:  Negative for rash.  Neurological:  Positive for headaches. Negative for dizziness, weakness and light-headedness.  Psychiatric/Behavioral:  Negative for confusion and dysphoric mood.        Objective:   Physical Exam Constitutional:      General: She is not in acute distress.    Appearance: Normal appearance. She is well-developed and normal weight. She is not ill-appearing, toxic-appearing or diaphoretic.     Comments: Mildly fatigued  HENT:     Head: Normocephalic and atraumatic.     Comments: Nares are injected and congested    No sinus tenderness    Right Ear: Tympanic membrane, ear canal and external ear normal.     Left Ear: Tympanic membrane, ear canal and external ear normal.      Nose: Congestion and rhinorrhea present.     Mouth/Throat:     Mouth: Mucous membranes are moist.     Pharynx: Oropharynx is clear. No oropharyngeal exudate or posterior oropharyngeal erythema.     Comments: Clear pnd  Eyes:     General:        Right eye: No discharge.        Left eye: No discharge.     Conjunctiva/sclera: Conjunctivae normal.     Pupils: Pupils are equal, round, and reactive to light.  Cardiovascular:     Rate and Rhythm: Normal rate.     Heart sounds: Normal heart sounds.  Pulmonary:     Effort: Pulmonary effort is normal. No respiratory distress.     Breath sounds: Normal breath sounds. No stridor. No wheezing, rhonchi or rales.     Comments: Good air exchange  Chest:     Chest wall: No tenderness.  Musculoskeletal:     Cervical back: Normal range of motion and neck supple.  Lymphadenopathy:     Cervical: No cervical adenopathy.  Skin:    General: Skin is warm and dry.     Capillary Refill: Capillary refill takes less than 2 seconds.     Findings: No rash.  Neurological:     Mental Status: She is alert.     Cranial Nerves: No cranial nerve deficit.  Psychiatric:        Mood and Affect: Mood normal.           Assessment & Plan:   Problem List Items Addressed This Visit       Other   COVID-19 - Primary    With mild uri symptoms- over a week/ positive test yesterday  Reassuring vitals and exam Out of window for anti viral  Disc sympt care-see AVs ER precautions noted  Disc s/s of complications like pna or sinusitis to watch for  Update if not starting to improve in a week or if worsening  Discussed isolation and masking protocols

## 2022-04-13 NOTE — Assessment & Plan Note (Addendum)
With mild uri symptoms- over a week/ positive test yesterday  Reassuring vitals and exam Out of window for anti viral  Disc sympt care-see AVs ER precautions noted  Disc s/s of complications like pna or sinusitis to watch for   Update if not starting to improve in a week or if worsening  Discussed isolation and masking protocols

## 2022-05-07 ENCOUNTER — Encounter: Payer: Self-pay | Admitting: Family Medicine

## 2022-05-07 ENCOUNTER — Ambulatory Visit (INDEPENDENT_AMBULATORY_CARE_PROVIDER_SITE_OTHER): Payer: Medicare HMO | Admitting: Family Medicine

## 2022-05-07 VITALS — BP 116/78 | HR 88 | Temp 98.2°F | Ht 63.5 in | Wt 161.0 lb

## 2022-05-07 DIAGNOSIS — R350 Frequency of micturition: Secondary | ICD-10-CM

## 2022-05-07 LAB — POC URINALSYSI DIPSTICK (AUTOMATED)
Bilirubin, UA: NEGATIVE
Blood, UA: NEGATIVE
Glucose, UA: NEGATIVE
Ketones, UA: NEGATIVE
Leukocytes, UA: NEGATIVE
Nitrite, UA: NEGATIVE
Protein, UA: NEGATIVE
Spec Grav, UA: >=1.03 — AB (ref 1.010–1.025)
Urobilinogen, UA: 0.2 E.U./dL
pH, UA: 5 (ref 5.0–8.0)

## 2022-05-07 MED ORDER — SOLIFENACIN SUCCINATE 5 MG PO TABS: 5.0000 mg | ORAL_TABLET | Freq: Every day | ORAL | 1 refills | Status: DC

## 2022-05-07 NOTE — Progress Notes (Signed)
Subjective:    Patient ID: [name], female    DOB: [birth year], 82 y.o.   MRN: [id number]  HPI Pt presents for urinary symptoms   Wt Readings from Last 3 Encounters:  05/07/22 161 lb (73 kg)  04/13/22 160 lb (72.6 kg)  03/15/22 166 lb 6 oz (75.5 kg)   28.07 kg/m  Vitals:   05/07/22 1202  BP: 116/78  Pulse: 88  Temp: 98.2 F (36.8 C)  SpO2: 94%    Frequent urination  Feels like she has to go all the time Leaks before she gets to the bathroom   At night - sometimes up to 5 times  Uses bed side commode   No change in odor  No blood in urine that she can see  No dysuria  A little pressure to urinate   ? Unsure if she empties  Strains a little   Occ stress incontinence when getting out of chair  Not with cough or sneeze   H/o e coli uti in nov Pan sensitive  Allergic to amp and sulfa  Had covid last mo   Saw urologist years ago for ? Incontinence Had bad std years ago causing uti   Results for orders placed or performed in visit on 05/07/22  POCT Urinalysis Dipstick (Automated)  Result Value Ref Range   Color, UA yellow    Clarity, UA clear    Glucose, UA Negative Negative   Bilirubin, UA neg    Ketones, UA neg    Spec Grav, UA >=1.030 (A) 1.010 - 1.025   Blood, UA neg    pH, UA 5.0 5.0 - 8.0   Protein, UA Negative Negative   Urobilinogen, UA 0.2 0.2 or 1.0 E.U./dL   Nitrite, UA neg    Leukocytes, UA Negative Negative    Patient Active Problem List   Diagnosis Date Noted   COVID-19 04/13/2022   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   Bruise 03/15/2022   Hammer toes of both feet 03/15/2022   Knee pain 12/28/2021   Decreased GFR 07/21/2021   Palpitations 06/24/2021   History of colon cancer 04/02/2020   Atherosclerosis of aorta ([place]) 04/02/2020   Blood clotting disorder ([place]) 06/30/2018   Fall 10/28/2017   Genetic testing 04/25/2017   History of DVT (deep vein thrombosis) 11/24/2016   Cancer of overlapping sites  of colon ([place]) 09/23/2016   Estrogen deficiency 07/30/2016   Screening mammogram, encounter for 07/30/2016   Routine general medical examination at a health care facility 07/27/2016   H/O: CVA (cerebrovascular accident) 05/06/2015   HTN (hypertension) 05/06/2015   HLD (hyperlipidemia) 05/06/2015   GERD (gastroesophageal reflux disease) 05/06/2015   CAD (coronary artery disease) 05/06/2015   Urine frequency 12/28/2013   Seborrheic keratosis 07/05/2012   Herpes simplex 12/29/2010   Coronary artery disease, non-occlusive 10/16/2010   Insomnia 02/02/2010   HAND PAIN, BILATERAL 01/09/2010   Prediabetes 07/29/2009   ALLERGIC RHINITIS 12/04/2008   Vitamin D deficiency 04/30/2008   COLONIC POLYPS 06/28/2007   IBS 03/17/2007   INTERSTITIAL CYSTITIS 03/17/2007   Osteoporosis 03/17/2007   Past Medical History:  Diagnosis Date   Anemia    Arthritis    Astigmatism    both eyes   Cancer ([place])    colon surgery done last chemo tx 01-17-17   Cataract    Colon cancer ([place])    Coronary artery disease, non-occlusive    Dyspnea    more so exertion   Dysrhythmia  skipped beats every now and then   GERD (gastroesophageal reflux disease)    Glaucoma    both eyes right eye worse than left   Hammer toes of both feet    Headache    migraines with aura   Heel spur    both heels and front   HSV (herpes simplex virus) infection    Hyperglycemia    denies   Hyperlipidemia    Hypertension    IBS (irritable bowel syndrome)    IC (interstitial cystitis)    Insomnia disorder related to known organic factor    Osteoporosis    PONV (postoperative nausea and vomiting)    has had more surgeries, and had no problems   Stroke [facility]) 2016   Vitamin D deficiency    Past Surgical History:  Procedure Laterality Date   ABDOMINAL HYSTERECTOMY     1 ovary and womb removed   CARDIAC CATHETERIZATION  7/12   non obst dz -- not sure what yr   CHOLECYSTECTOMY     EUS N/A 02/03/2017   Procedure: UPPER  ENDOSCOPIC ULTRASOUND (EUS) LINEAR;  Surgeon: [name], MD;  Location: [facility];  Service: Endoscopy;  Laterality: N/A;   EYE SURGERY Bilateral    ioc for cataracts    LAPAROSCOPIC RIGHT COLECTOMY Right 11/10/2016   Procedure: LAPAROSCOPIC EXTENDED RIGHT COLECTOMY;  Surgeon: [name], MD;  Location: [place];  Service: General;  Laterality: Right;   PORT-A-CATH REMOVAL Left 11/21/2019   Procedure: REMOVAL PORT-A-CATH;  Surgeon: [name], MD;  Location: [place];  Service: General;  Laterality: Left;   PORTACATH PLACEMENT Left 12/22/2016   Procedure: INSERTION PORT-A-CATH;  Surgeon: [name], MD;  Location: [place];  Service: General;  Laterality: Left;   ROTATOR CUFF REPAIR Bilateral    Social History   Tobacco Use   Smoking status: Never   Smokeless tobacco: Never  Vaping Use   Vaping Use: Never used  Substance Use Topics   Alcohol use: No    Alcohol/week: 0.0 standard drinks of alcohol   Drug use: No   Family History  Problem Relation Age of Onset   Alcohol abuse Mother    Heart attack Mother    GER disease Mother    GER disease Father    Alcohol abuse Father    Stroke Father    Tuberculosis Father    Heart attack Father    Aneurysm Father    Mental illness Sister    Depression Sister    Breast cancer Neg Hx    Allergies  Allergen Reactions   Salmon [Fish Allergy] Anaphylaxis    This happens with "CANNED SALMON" only, if eaten uncooked   Ampicillin Nausea Only    Has patient had a PCN reaction causing immediate rash, facial/tongue/throat swelling, SOB or lightheadedness with hypotension: No Has patient had a PCN reaction causing severe rash involving mucus membranes or skin necrosis: No Has patient had a PCN reaction that required hospitalization: No Has patient had a PCN reaction occurring within the last 10 years: No If all of the above answers are "NO", then may proceed with Cephalosporin use.    Codeine  Nausea And Vomiting   Flagyl [Metronidazole] Nausea And Vomiting    Patient was taking this along with Neomycin and could not differentiate which med triggered the nausea & vomiting   Meperidine Hcl Nausea And Vomiting   Neomycin Nausea And Vomiting    Patient was taking this along with Flagyl  and could not differentiate which med triggered the nausea & vomiting   Propoxyphene Hcl Nausea And Vomiting   Sulfa Antibiotics Hives   Tramadol Hcl Nausea And Vomiting   Current Outpatient Medications on File Prior to Visit  Medication Sig Dispense Refill   acetaminophen (TYLENOL) 500 MG tablet Take 500 mg by mouth every 6 (six) hours as needed.     b complex vitamins tablet Take 1 tablet by mouth daily.     Calcium Carb-Cholecalciferol (CALCIUM 600 + D PO) Take 1 capsule by mouth in the morning and at bedtime.     ibuprofen (ADVIL) 200 MG tablet Take 200 mg by mouth every 6 (six) hours as needed.     Multiple Vitamin (MULTIVITAMIN WITH MINERALS) TABS tablet Take 1 tablet by mouth daily.      Multiple Vitamins-Minerals (HAIR SKIN AND NAILS FORMULA PO) Take by mouth.     Zinc Sulfate (ZINC 15 PO) Take by mouth.     No current facility-administered medications on file prior to visit.     Review of Systems  Constitutional:  Negative for activity change, appetite change, fatigue, fever and unexpected weight change.  HENT:  Negative for congestion, ear pain, rhinorrhea, sinus pressure and sore throat.   Eyes:  Negative for pain, redness and visual disturbance.  Respiratory:  Negative for cough, shortness of breath and wheezing.   Cardiovascular:  Negative for chest pain and palpitations.  Gastrointestinal:  Negative for abdominal pain, blood in stool, constipation and diarrhea.  Endocrine: Negative for polydipsia and polyuria.  Genitourinary:  Positive for frequency and urgency. Negative for dysuria, flank pain, hematuria and pelvic pain.  Musculoskeletal:  Negative for arthralgias, back pain and  myalgias.  Skin:  Negative for pallor and rash.  Allergic/Immunologic: Negative for environmental allergies.  Neurological:  Negative for dizziness, syncope and headaches.  Hematological:  Negative for adenopathy. Does not bruise/bleed easily.  Psychiatric/Behavioral:  Negative for decreased concentration and dysphoric mood. The patient is not nervous/anxious.        Objective:   Physical Exam Constitutional:      General: She is not in acute distress.    Appearance: Normal appearance. She is well-developed and normal weight. She is not ill-appearing or diaphoretic.  HENT:     Head: Normocephalic and atraumatic.  Eyes:     Conjunctiva/sclera: Conjunctivae normal.     Pupils: Pupils are equal, round, and reactive to light.  Neck:     Thyroid: No thyromegaly.     Vascular: No carotid bruit or JVD.  Cardiovascular:     Rate and Rhythm: Normal rate and regular rhythm.     Heart sounds: Normal heart sounds.     No gallop.  Pulmonary:     Effort: Pulmonary effort is normal. No respiratory distress.     Breath sounds: Normal breath sounds. No wheezing or rales.  Abdominal:     General: There is no distension or abdominal bruit.     Palpations: Abdomen is soft.     Tenderness: There is no right CVA tenderness or left CVA tenderness.     Comments: No suprapubic tenderness or fullness  No cva tenderness   Musculoskeletal:     Cervical back: Normal range of motion and neck supple.     Right lower leg: No edema.     Left lower leg: No edema.  Lymphadenopathy:     Cervical: No cervical adenopathy.  Skin:    General: Skin is warm and dry.  Coloration: Skin is not pale.     Findings: No rash.  Neurological:     Mental Status: She is alert.     Coordination: Coordination normal.     Deep Tendon Reflexes: Reflexes are normal and symmetric. Reflexes normal.  Psychiatric:        Mood and Affect: Mood normal.           Assessment & Plan:   Problem List Items Addressed This  Visit       Other   Urine frequency - Primary    Ua clear today  Suspect overactive bladder with urinary incontinence  Worse at night   Disc tx opt/pharm  Vesicare 5 mg px  Disc poss anticholinergic side eff incl constipation and dry mouth Enc good water intake  Disc ER precautions if unable to urinate   Will report back        Relevant Orders   POCT Urinalysis Dipstick (Automated) (Completed)

## 2022-05-07 NOTE — Assessment & Plan Note (Signed)
Ua clear today  Suspect overactive bladder with urinary incontinence  Worse at night   Disc tx opt/pharm  Vesicare 5 mg px  Disc poss anticholinergic side eff incl constipation and dry mouth Enc good water intake  Disc ER precautions if unable to urinate   Will report back

## 2022-05-07 NOTE — Patient Instructions (Addendum)
Your urinalysis is clear today  Drink more water to make urine less concentrated    I want to try medicine for overactive bladder to see if if helps  Generic vesicare   Possible dry mouth and constipation are side effects Miralax is fine if needed  If you don't tolerate this then stop it and let me know

## 2022-05-17 ENCOUNTER — Telehealth: Payer: Self-pay | Admitting: Family Medicine

## 2022-05-17 NOTE — Telephone Encounter (Signed)
[place] to schedule their annual wellness visit. Patient declined to schedule AWV at this time. Will call back to schedule AWV.  [place] Direct Dial: [phone]

## 2022-05-24 ENCOUNTER — Telehealth: Payer: Self-pay

## 2022-05-24 NOTE — Patient Outreach (Signed)
  Care Coordination   Initial Visit Note   05/24/2022 Name: [name] MRN: [id number] DOB: [birth year]  [name] is a 82 y.o. year old female who sees Tower, [name], MD for primary care. I spoke with  [name] by phone today.  What matters to the patients health and wellness today?  Patient denies having any nursing or care coordination needs.     Goals Addressed             This Visit's Progress    Care coordination services - no further follow up needed.       Interventions Today    Flowsheet Row Most Recent Value  General Interventions   General Interventions Discussed/Reviewed General Interventions Discussed  [Care coordination program/ services discussed.  SDOH survey completed. AWV discussed and patient advised to contact provider office to schedule.  Advised to contact primary care provider office if care coordination services needed in the future]              SDOH assessments and interventions completed:  Yes  SDOH Interventions Today    Flowsheet Row Most Recent Value  SDOH Interventions   Food Insecurity Interventions Intervention Not Indicated  Housing Interventions Intervention Not Indicated  Transportation Interventions Intervention Not Indicated        Care Coordination Interventions:  Yes, provided   Follow up plan: No further intervention required.   Encounter Outcome:  Pt. Visit Completed   Quinn Plowman RN,BSN,CCM [place] [phone] direct line

## 2022-05-30 ENCOUNTER — Other Ambulatory Visit: Payer: Self-pay | Admitting: Family Medicine

## 2022-06-12 ENCOUNTER — Other Ambulatory Visit: Payer: Self-pay | Admitting: Family Medicine

## 2022-06-22 ENCOUNTER — Encounter: Payer: Self-pay | Admitting: Family Medicine

## 2022-06-22 ENCOUNTER — Ambulatory Visit (INDEPENDENT_AMBULATORY_CARE_PROVIDER_SITE_OTHER): Payer: Medicare HMO | Admitting: Family Medicine

## 2022-06-22 VITALS — BP 110/70 | HR 62 | Temp 97.8°F | Ht 63.5 in | Wt 157.5 lb

## 2022-06-22 DIAGNOSIS — M2041 Other hammer toe(s) (acquired), right foot: Secondary | ICD-10-CM | POA: Diagnosis not present

## 2022-06-22 DIAGNOSIS — B351 Tinea unguium: Secondary | ICD-10-CM

## 2022-06-22 DIAGNOSIS — R7303 Prediabetes: Secondary | ICD-10-CM | POA: Diagnosis not present

## 2022-06-22 DIAGNOSIS — I10 Essential (primary) hypertension: Secondary | ICD-10-CM | POA: Diagnosis not present

## 2022-06-22 DIAGNOSIS — L84 Corns and callosities: Secondary | ICD-10-CM | POA: Diagnosis not present

## 2022-06-22 DIAGNOSIS — M2042 Other hammer toe(s) (acquired), left foot: Secondary | ICD-10-CM | POA: Diagnosis not present

## 2022-06-22 LAB — POCT GLYCOSYLATED HEMOGLOBIN (HGB A1C): Hemoglobin A1C: 5.6 % (ref 4.0–5.6)

## 2022-06-22 NOTE — Assessment & Plan Note (Signed)
Pared down callus under L 3rd metatarsal head today with improvement in symptoms Advised use of corn pad  Orthotics if tolerable Shoe with tall toe box  Can ref to podiatry if needed

## 2022-06-22 NOTE — Patient Instructions (Addendum)
I pared down the callus on your left foot  Keep it clean with soap and water  Try a corn pad from the drug store   Hopefully this will allow you to wear the new orthotics/shoe inserts   Continue the topical antifungal product on your toe nail  If you want to see a podiatrist let us know   Your blood pressure looks good   Your blood sugar is still in the prediabetic range (it is improved from last time)  Continue good activity and healthy diet   Try to get most of your carbohydrates from produce (with the exception of white potatoes)  Eat less bread/pasta/rice/snack foods/cereals/sweets and other items from the middle of the grocery store (processed carbs)

## 2022-06-22 NOTE — Assessment & Plan Note (Signed)
Ongoing  Has some new shoes and orthotics /needs tall toe box  Offered podiatry referral - declined for now  Pared down callus on L foot

## 2022-06-22 NOTE — Progress Notes (Signed)
Subjective:    Patient ID: [name], female    DOB: [birth year], 82 y.o.   MRN: [id number]  HPI Pt presents for a foot problem Also f/u of prediabetes  HTN   Wt Readings from Last 3 Encounters:  06/22/22 157 lb 8 oz (71.4 kg)  05/07/22 161 lb (73 kg)  04/13/22 160 lb (72.6 kg)   27.46 kg/m  Vitals:   06/22/22 1150  BP: 110/70  Pulse: 62  Temp: 97.8 F (36.6 C)  SpO2: 97%   H/o mod to severe hammer to deformity   L great toe nail is white and thick  Bought a topical product for fungus  Helped a little  Some soreness around it   Has a callus on L foot  Uses a foot file     She went to [facility] to look at shoes  She did buy shoes and some arch supports    Went to a chiropractor    Lab Results  Component Value Date   HGBA1C 5.9 06/24/2021   Improved  Today Lab Results  Component Value Date   HGBA1C 5.6 06/22/2022     HTN bp is stable today  No cp or palpitations or headaches or edema  No medications- watching this  Is sometimes higher at home  BP Readings from Last 3 Encounters:  06/22/22 110/70  05/07/22 116/78  04/13/22 116/62     Patient Active Problem List   Diagnosis Date Noted   Onychomycosis 06/22/2022   COVID-19 04/13/2022   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   Bruise 03/15/2022   Hammer toes of both feet 03/15/2022   Knee pain 12/28/2021   Decreased GFR 07/21/2021   Palpitations 06/24/2021   History of colon cancer 04/02/2020   Atherosclerosis of aorta ([facility]) 04/02/2020   Blood clotting disorder ([facility]) 06/30/2018   Fall 10/28/2017   Genetic testing 04/25/2017   History of DVT (deep vein thrombosis) 11/24/2016   Cancer of overlapping sites of colon ([facility]) 09/23/2016   Estrogen deficiency 07/30/2016   Screening mammogram, encounter for 07/30/2016   Routine general medical examination at a health care facility 07/27/2016   H/O: CVA (cerebrovascular accident) 05/06/2015   HTN (hypertension)  05/06/2015   HLD (hyperlipidemia) 05/06/2015   GERD (gastroesophageal reflux disease) 05/06/2015   CAD (coronary artery disease) 05/06/2015   Urine frequency 12/28/2013   Seborrheic keratosis 07/05/2012   Herpes simplex 12/29/2010   Callus of foot 10/16/2010   Coronary artery disease, non-occlusive 10/16/2010   Insomnia 02/02/2010   HAND PAIN, BILATERAL 01/09/2010   Prediabetes 07/29/2009   ALLERGIC RHINITIS 12/04/2008   Vitamin D deficiency 04/30/2008   COLONIC POLYPS 06/28/2007   IBS 03/17/2007   INTERSTITIAL CYSTITIS 03/17/2007   Osteoporosis 03/17/2007   Past Medical History:  Diagnosis Date   Anemia    Arthritis    Astigmatism    both eyes   Cancer ([facility])    colon surgery done last chemo tx 01-17-17   Cataract    Colon cancer ([facility])    Coronary artery disease, non-occlusive    Dyspnea    more so exertion   Dysrhythmia    skipped beats every now and then   GERD (gastroesophageal reflux disease)    Glaucoma    both eyes right eye worse than left   Hammer toes of both feet    Headache    migraines with aura   Heel spur    both heels and front   HSV (  herpes simplex virus) infection    Hyperglycemia    denies   Hyperlipidemia    Hypertension    IBS (irritable bowel syndrome)    IC (interstitial cystitis)    Insomnia disorder related to known organic factor    Osteoporosis    PONV (postoperative nausea and vomiting)    has had more surgeries, and had no problems   Stroke [facility]) 2016   Vitamin D deficiency    Past Surgical History:  Procedure Laterality Date   ABDOMINAL HYSTERECTOMY     1 ovary and womb removed   CARDIAC CATHETERIZATION  7/12   non obst dz -- not sure what yr   CHOLECYSTECTOMY     EUS N/A 02/03/2017   Procedure: UPPER ENDOSCOPIC ULTRASOUND (EUS) LINEAR;  Surgeon: Rachael Fee, MD;  Location: [facility];  Service: Endoscopy;  Laterality: N/A;   EYE SURGERY Bilateral    ioc for cataracts    LAPAROSCOPIC RIGHT COLECTOMY Right 11/10/2016    Procedure: LAPAROSCOPIC EXTENDED RIGHT COLECTOMY;  Surgeon: Almond Lint, MD;  [facility];  Service: General;  Laterality: Right;   PORT-A-CATH REMOVAL Left 11/21/2019   Procedure: REMOVAL PORT-A-CATH;  Surgeon: Almond Lint, MD;  Location: [facility];  Service: General;  Laterality: Left;   PORTACATH PLACEMENT Left 12/22/2016   Procedure: INSERTION PORT-A-CATH;  Surgeon: Almond Lint, MD;  Location: [facility];  Service: General;  Laterality: Left;   ROTATOR CUFF REPAIR Bilateral    Social History   Tobacco Use   Smoking status: Never   Smokeless tobacco: Never  Vaping Use   Vaping Use: Never used  Substance Use Topics   Alcohol use: No    Alcohol/week: 0.0 standard drinks of alcohol   Drug use: No   Family History  Problem Relation Age of Onset   Alcohol abuse Mother    Heart attack Mother    GER disease Mother    GER disease Father    Alcohol abuse Father    Stroke Father    Tuberculosis Father    Heart attack Father    Aneurysm Father    Mental illness Sister    Depression Sister    Breast cancer Neg Hx    Allergies  Allergen Reactions   Salmon [Fish Allergy] Anaphylaxis    This happens with "CANNED SALMON" only, if eaten uncooked   Ampicillin Nausea Only    Has patient had a PCN reaction causing immediate rash, facial/tongue/throat swelling, SOB or lightheadedness with hypotension: No Has patient had a PCN reaction causing severe rash involving mucus membranes or skin necrosis: No Has patient had a PCN reaction that required hospitalization: No Has patient had a PCN reaction occurring within the last 10 years: No If all of the above answers are "NO", then may proceed with Cephalosporin use.    Codeine Nausea And Vomiting   Flagyl [Metronidazole] Nausea And Vomiting    Patient was taking this along with Neomycin and could not differentiate which med triggered the nausea & vomiting   Meperidine Hcl Nausea And Vomiting    Neomycin Nausea And Vomiting    Patient was taking this along with Flagyl and could not differentiate which med triggered the nausea & vomiting   Propoxyphene Hcl Nausea And Vomiting   Sulfa Antibiotics Hives   Tramadol Hcl Nausea And Vomiting   Current Outpatient Medications on File Prior to Visit  Medication Sig Dispense Refill   acetaminophen (TYLENOL) 500 MG tablet Take 500 mg by mouth  every 6 (six) hours as needed.     b complex vitamins tablet Take 1 tablet by mouth daily.     Calcium Carb-Cholecalciferol (CALCIUM 600 + D PO) Take 1 capsule by mouth in the morning and at bedtime.     ibuprofen (ADVIL) 200 MG tablet Take 200 mg by mouth every 6 (six) hours as needed.     Multiple Vitamin (MULTIVITAMIN WITH MINERALS) TABS tablet Take 1 tablet by mouth daily.      Multiple Vitamins-Minerals (HAIR SKIN AND NAILS FORMULA PO) Take by mouth.     solifenacin (VESICARE) 5 MG tablet TAKE 1 TABLET (5 MG TOTAL) BY MOUTH DAILY. 90 tablet 0   Zinc Sulfate (ZINC 15 PO) Take by mouth.     No current facility-administered medications on file prior to visit.    Review of Systems  Constitutional:  Negative for activity change, appetite change, fatigue, fever and unexpected weight change.  HENT:  Negative for congestion, ear pain, rhinorrhea, sinus pressure and sore throat.   Eyes:  Negative for pain, redness and visual disturbance.  Respiratory:  Negative for cough, shortness of breath and wheezing.   Cardiovascular:  Negative for chest pain and palpitations.  Gastrointestinal:  Negative for abdominal pain, blood in stool, constipation and diarrhea.  Endocrine: Negative for polydipsia and polyuria.  Genitourinary:  Negative for dysuria, frequency and urgency.  Musculoskeletal:  Positive for arthralgias. Negative for back pain and myalgias.  Skin:  Negative for pallor and rash.       Calluses on feet   Thick toe nail  Allergic/Immunologic: Negative for environmental allergies.  Neurological:   Negative for dizziness, syncope and headaches.  Hematological:  Negative for adenopathy. Does not bruise/bleed easily.  Psychiatric/Behavioral:  Negative for decreased concentration and dysphoric mood. The patient is not nervous/anxious.        Objective:   Physical Exam Constitutional:      General: She is not in acute distress.    Appearance: Normal appearance. She is well-developed and normal weight. She is not ill-appearing or diaphoretic.  HENT:     Head: Normocephalic and atraumatic.  Eyes:     Conjunctiva/sclera: Conjunctivae normal.     Pupils: Pupils are equal, round, and reactive to light.  Neck:     Thyroid: No thyromegaly.     Vascular: No carotid bruit or JVD.  Cardiovascular:     Rate and Rhythm: Normal rate and regular rhythm.     Heart sounds: Normal heart sounds.     No gallop.  Pulmonary:     Effort: Pulmonary effort is normal. No respiratory distress.     Breath sounds: Normal breath sounds. No wheezing or rales.  Abdominal:     General: There is no distension or abdominal bruit.     Palpations: Abdomen is soft.  Musculoskeletal:     Cervical back: Normal range of motion and neck supple.     Right lower leg: No edema.     Left lower leg: No edema.     Comments: Hammer toes noted High arches  Lymphadenopathy:     Cervical: No cervical adenopathy.  Skin:    General: Skin is warm and dry.     Coloration: Skin is not pale.     Findings: No rash.     Comments: Callus under L 3rd MT head  After consent cleaned well and debrided with #15 scalpel  Tolerated well  Noted relief     L great nail is thickened with debris  and discolored  Some clearing at base   Neurological:     Mental Status: She is alert.     Sensory: No sensory deficit.     Coordination: Coordination normal.     Deep Tendon Reflexes: Reflexes are normal and symmetric. Reflexes normal.  Psychiatric:        Mood and Affect: Mood normal.           Assessment & Plan:   Problem  List Items Addressed This Visit       Cardiovascular and Mediastinum   HTN (hypertension)    bp in fair control at this time  BP Readings from Last 1 Encounters:  06/22/22 110/70  No changes needed-no medicatoins Most recent labs reviewed  Disc lifstyle change with low sodium diet and exercise          Musculoskeletal and Integument   Callus of foot    Pared down callus under L 3rd metatarsal head today with improvement in symptoms Advised use of corn pad  Orthotics if tolerable Shoe with tall toe box  Can ref to podiatry if needed      Hammer toes of both feet    Ongoing  Has some new shoes and orthotics /needs tall toe box  Offered podiatry referral - declined for now  Pared down callus on L foot      Onychomycosis    L great toe  Plans to continue topical treatment/ keep clean  Offered podiatry ref in [place] if needed         Other   Prediabetes - Primary    Lab Results  Component Value Date   HGBA1C 5.6 06/22/2022  This is improved  disc imp of low glycemic diet and wt loss to prevent DM2       Relevant Orders   POCT glycosylated hemoglobin (Hb A1C) (Completed)

## 2022-06-22 NOTE — Assessment & Plan Note (Signed)
Lab Results  Component Value Date   HGBA1C 5.6 06/22/2022   This is improved  disc imp of low glycemic diet and wt loss to prevent DM2

## 2022-06-22 NOTE — Assessment & Plan Note (Signed)
L great toe  Plans to continue topical treatment/ keep clean  Offered podiatry ref in furture if needed

## 2022-06-22 NOTE — Assessment & Plan Note (Signed)
bp in fair control at this time  BP Readings from Last 1 Encounters:  06/22/22 110/70   No changes needed-no medicatoins Most recent labs reviewed  Disc lifstyle change with low sodium diet and exercise

## 2022-06-23 ENCOUNTER — Encounter (HOSPITAL_COMMUNITY): Payer: Self-pay | Admitting: Emergency Medicine

## 2022-06-23 ENCOUNTER — Emergency Department (HOSPITAL_COMMUNITY)
Admission: EM | Admit: 2022-06-23 | Discharge: 2022-06-23 | Disposition: A | Discharge status: Home / Self Care | Payer: Medicare HMO | Attending: Emergency Medicine | Admitting: Emergency Medicine

## 2022-06-23 ENCOUNTER — Other Ambulatory Visit: Payer: Self-pay

## 2022-06-23 DIAGNOSIS — S60942A Unspecified superficial injury of right middle finger, initial encounter: Secondary | ICD-10-CM | POA: Diagnosis not present

## 2022-06-23 DIAGNOSIS — Z041 Encounter for examination and observation following transport accident: Secondary | ICD-10-CM | POA: Diagnosis not present

## 2022-06-23 DIAGNOSIS — I959 Hypotension, unspecified: Secondary | ICD-10-CM | POA: Diagnosis not present

## 2022-06-23 DIAGNOSIS — W19XXXA Unspecified fall, initial encounter: Secondary | ICD-10-CM | POA: Diagnosis not present

## 2022-06-23 DIAGNOSIS — M549 Dorsalgia, unspecified: Secondary | ICD-10-CM | POA: Diagnosis not present

## 2022-06-23 DIAGNOSIS — Z743 Need for continuous supervision: Secondary | ICD-10-CM | POA: Diagnosis not present

## 2022-06-23 NOTE — ED Triage Notes (Signed)
Patient brought in by [facility] after falling off a horse today. Patient was trial riding and the horse spooked as the saddle slid off to the left causing patient to fall off to the left side. No complaints of pain by the patient. Aox4. She was wearing a helmet. Patient is not on a blood thinner. BP was soft w/ EMS 96 systolic, HR 96-100, Spo2-100% Patient denying neck, back, chest pain, shortness of breath.

## 2022-06-23 NOTE — Discharge Instructions (Signed)
Follow-up with your family doctor in the office.  Please return for headache confusion or vomiting.

## 2022-06-23 NOTE — ED Provider Notes (Signed)
[place] EMERGENCY DEPARTMENT AT [facility] Provider Note   CSN: [id number] Arrival date & time: 06/23/22  1604     History  Chief Complaint  Patient presents with   Trauma    [name] is a 82 y.o. female.  82 yo F with a chief complaint of a fall from horse.  She tells me that actually the saddle just slid off to the right and she ended up jumping off.  She denies any injury in the fall.  She thinks maybe she brushed her right middle finger against the horse and has some mild discomfort there but she does not think it is bad.  She came really because she wants to know how her friend is doing.  She denies head injury denies loss of consciousness has chronic pain but denies any changes.  Denies lower extremity pain.  Has been able to ambulate without issue.  Denies back pain.        Home Medications Prior to Admission medications   Medication Sig Start Date End Date Taking? Authorizing Provider  acetaminophen (TYLENOL) 500 MG tablet Take 500 mg by mouth every 6 (six) hours as needed.    [provider]  b complex vitamins tablet Take 1 tablet by mouth daily.    [provider]  Calcium Carb-Cholecalciferol (CALCIUM 600 + D PO) Take 1 capsule by mouth in the morning and at bedtime.    [provider]  ibuprofen (ADVIL) 200 MG tablet Take 200 mg by mouth every 6 (six) hours as needed.    [provider]  Multiple Vitamin (MULTIVITAMIN WITH MINERALS) TABS tablet Take 1 tablet by mouth daily.     [provider]  Multiple Vitamins-Minerals (HAIR SKIN AND NAILS FORMULA PO) Take by mouth.    [provider]  solifenacin (VESICARE) 5 MG tablet TAKE 1 TABLET (5 MG TOTAL) BY MOUTH DAILY. 05/31/22   Tower, Audrie Gallus, MD  Zinc Sulfate (ZINC 15 PO) Take by mouth.    [provider]      Allergies    Rosanne Ashing allergy], Ampicillin, Codeine, Flagyl [metronidazole], Meperidine hcl, Neomycin, Propoxyphene hcl,  Sulfa antibiotics, and Tramadol hcl    Review of Systems   Review of Systems  Physical Exam Updated Vital Signs BP 112/61 (BP Location: Right Arm)   Pulse 87   Temp 98.7 F (37.1 C) (Oral)   Resp 15   SpO2 98%  Physical Exam Vitals and nursing note reviewed.  Constitutional:      General: She is not in acute distress.    Appearance: She is well-developed. She is not diaphoretic.  HENT:     Head: Normocephalic and atraumatic.  Eyes:     Pupils: Pupils are equal, round, and reactive to light.  Cardiovascular:     Rate and Rhythm: Normal rate and regular rhythm.     Heart sounds: No murmur heard.    No friction rub. No gallop.  Pulmonary:     Effort: Pulmonary effort is normal.     Breath sounds: No wheezing or rales.  Abdominal:     General: There is no distension.     Palpations: Abdomen is soft.     Tenderness: There is no abdominal tenderness.  Musculoskeletal:        General: No tenderness.     Cervical back: Normal range of motion and neck supple.     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  Full range of motion  of the right third digit.  Palpated from head to toe without any noted areas of bony tenderness.  Able to rotate her head 45 degrees in either direction without midline pain.  Skin:    General: Skin is warm and dry.  Neurological:     Mental Status: She is alert and oriented to person, place, and time.  Psychiatric:        Behavior: Behavior normal.     ED Results / Procedures / Treatments   Labs (all labs ordered are listed, but only abnormal results are displayed) Labs Reviewed - No data to display  EKG None  Radiology No results found.  Procedures Procedures    Medications Ordered in ED Medications - No data to display  ED Course/ Medical Decision Making/ A&P                             Medical Decision Making  82 yo F with a chief complaints of a fall off of a horse.  Unfortunately it sounds like she is in 2 of her  friends all fell off the horse very close together.  She denies any specific injury but she is interested to know how her friend is doing.  Exam here benign.  Do not feel she benefit from any imaging.  Will have her follow-up with her family doctor in the office.  4:56 PM:  I have discussed the diagnosis/risks/treatment options with the patient.  Evaluation and diagnostic testing in the emergency department does not suggest an emergent condition requiring admission or immediate intervention beyond what has been performed at this time.  They will follow up with PCP. We also discussed returning to the ED immediately if new or worsening sx occur. We discussed the sx which are most concerning (e.g., sudden worsening pain, fever, inability to tolerate by mouth) that necessitate immediate return. Medications administered to the patient during their visit and any new prescriptions provided to the patient are listed below.  Medications given during this visit Medications - No data to display   The patient appears reasonably screen and/or stabilized for discharge and I doubt any other medical condition or other [facility] requiring further screening, evaluation, or treatment in the ED at this time prior to discharge.          Final Clinical Impression(s) / ED Diagnoses Final diagnoses:  Fall from horse, initial encounter    Rx / DC Orders ED Discharge Orders     None         Melene Plan, DO 06/23/22 1656

## 2022-07-14 ENCOUNTER — Other Ambulatory Visit: Payer: Self-pay | Admitting: Family Medicine

## 2022-07-15 ENCOUNTER — Telehealth: Payer: Self-pay | Admitting: Family Medicine

## 2022-07-15 MED ORDER — ROSUVASTATIN CALCIUM 20 MG PO TABS: 20.0000 mg | ORAL_TABLET | Freq: Every day | ORAL | 1 refills | Status: DC

## 2022-07-15 NOTE — Telephone Encounter (Signed)
Rx sent to pharmacy and lab appt scheduled  

## 2022-07-15 NOTE — Telephone Encounter (Signed)
Patient would like to know if she is still suppose to be taking rosuvastatin calcium 20 mg for her cholesterol? She stated that she needs a refill,however I do not see this medication on her ned list. Please advise

## 2022-07-15 NOTE — Telephone Encounter (Signed)
Don't know why it was removed Please refill it  Schedule fasting labs in 1-2 mo  for lipids/ is due

## 2022-07-15 NOTE — Telephone Encounter (Signed)
Looks like med was removed from list on 04/13/22 stating pt said she isn't taking med anymore

## 2022-08-16 ENCOUNTER — Telehealth: Payer: Self-pay | Admitting: Family Medicine

## 2022-08-16 DIAGNOSIS — E559 Vitamin D deficiency, unspecified: Secondary | ICD-10-CM

## 2022-08-16 DIAGNOSIS — E7800 Pure hypercholesterolemia, unspecified: Secondary | ICD-10-CM

## 2022-08-16 DIAGNOSIS — I10 Essential (primary) hypertension: Secondary | ICD-10-CM

## 2022-08-16 NOTE — Telephone Encounter (Signed)
-----   Message from Alvina Chou sent at 08/03/2022  7:48 AM EDT ----- Regarding: Lab orders for Wednesday, 6.26.24 Lab orders, thanks

## 2022-08-18 ENCOUNTER — Other Ambulatory Visit (INDEPENDENT_AMBULATORY_CARE_PROVIDER_SITE_OTHER): Payer: Medicare HMO

## 2022-08-18 DIAGNOSIS — E559 Vitamin D deficiency, unspecified: Secondary | ICD-10-CM

## 2022-08-18 DIAGNOSIS — Z961 Presence of intraocular lens: Secondary | ICD-10-CM | POA: Diagnosis not present

## 2022-08-18 DIAGNOSIS — E7800 Pure hypercholesterolemia, unspecified: Secondary | ICD-10-CM | POA: Diagnosis not present

## 2022-08-18 DIAGNOSIS — I10 Essential (primary) hypertension: Secondary | ICD-10-CM

## 2022-08-18 DIAGNOSIS — H401133 Primary open-angle glaucoma, bilateral, severe stage: Secondary | ICD-10-CM | POA: Diagnosis not present

## 2022-08-18 LAB — COMPREHENSIVE METABOLIC PANEL
ALT: 19 U/L (ref 0–35)
AST: 21 U/L (ref 0–37)
Albumin: 4.1 g/dL (ref 3.5–5.2)
Alkaline Phosphatase: 63 U/L (ref 39–117)
BUN: 24 mg/dL — ABNORMAL HIGH (ref 6–23)
CO2: 26 mEq/L (ref 19–32)
Calcium: 9.9 mg/dL (ref 8.4–10.5)
Chloride: 106 mEq/L (ref 96–112)
Creatinine, Ser: 1.12 mg/dL (ref 0.40–1.20)
GFR: 45.29 mL/min — ABNORMAL LOW (ref >60.00)
Glucose, Bld: 106 mg/dL — ABNORMAL HIGH (ref 70–99)
Potassium: 4.3 mEq/L (ref 3.5–5.1)
Sodium: 139 mEq/L (ref 135–145)
Total Bilirubin: 0.7 mg/dL (ref 0.2–1.2)
Total Protein: 7.1 g/dL (ref 6.0–8.3)

## 2022-08-18 LAB — LIPID PANEL
Cholesterol: 172 mg/dL (ref 0–200)
HDL: 73.7 mg/dL (ref >39.00)
LDL Cholesterol: 81 mg/dL (ref 0–99)
NonHDL: 98.72
Total CHOL/HDL Ratio: 2
Triglycerides: 88 mg/dL (ref 0.0–149.0)
VLDL: 17.6 mg/dL (ref 0.0–40.0)

## 2022-08-18 LAB — TSH: TSH: 3.58 u[IU]/mL (ref 0.35–5.50)

## 2022-08-18 LAB — CBC WITH DIFFERENTIAL/PLATELET
Basophils Absolute: 0.1 10*3/uL (ref 0.0–0.1)
Basophils Relative: 0.8 % (ref 0.0–3.0)
Eosinophils Absolute: 0.1 10*3/uL (ref 0.0–0.7)
Eosinophils Relative: 2.1 % (ref 0.0–5.0)
HCT: 39.2 % (ref 36.0–46.0)
Hemoglobin: 12.8 g/dL (ref 12.0–15.0)
Lymphocytes Relative: 27.8 % (ref 12.0–46.0)
Lymphs Abs: 1.9 10*3/uL (ref 0.7–4.0)
MCHC: 32.6 g/dL (ref 30.0–36.0)
MCV: 91.7 fl (ref 78.0–100.0)
Monocytes Absolute: 0.6 10*3/uL (ref 0.1–1.0)
Monocytes Relative: 8.9 % (ref 3.0–12.0)
Neutro Abs: 4.1 10*3/uL (ref 1.4–7.7)
Neutrophils Relative %: 60.4 % (ref 43.0–77.0)
Platelets: 275 10*3/uL (ref 150.0–400.0)
RBC: 4.27 Mil/uL (ref 3.87–5.11)
RDW: 13.7 % (ref 11.5–15.5)
WBC: 6.8 10*3/uL (ref 4.0–10.5)

## 2022-08-18 LAB — VITAMIN D 25 HYDROXY (VIT D DEFICIENCY, FRACTURES): VITD: 50.64 ng/mL (ref 30.00–100.00)

## 2022-08-19 ENCOUNTER — Encounter: Payer: Self-pay | Admitting: *Deleted

## 2022-08-19 DIAGNOSIS — Z0100 Encounter for examination of eyes and vision without abnormal findings: Secondary | ICD-10-CM | POA: Diagnosis not present

## 2022-08-19 DIAGNOSIS — Z460 Encounter for fitting and adjustment of spectacles and contact lenses: Secondary | ICD-10-CM | POA: Diagnosis not present

## 2022-08-19 DIAGNOSIS — Z973 Presence of spectacles and contact lenses: Secondary | ICD-10-CM | POA: Diagnosis not present

## 2022-09-08 ENCOUNTER — Ambulatory Visit: Payer: Medicare HMO | Admitting: Primary Care

## 2022-09-09 ENCOUNTER — Encounter: Payer: Self-pay | Admitting: Family

## 2022-09-09 ENCOUNTER — Ambulatory Visit: Payer: Medicare HMO | Admitting: Family

## 2022-09-09 VITALS — BP 108/70 | HR 78 | Temp 98.0°F | Ht 63.5 in | Wt 157.0 lb

## 2022-09-09 DIAGNOSIS — R300 Dysuria: Secondary | ICD-10-CM | POA: Insufficient documentation

## 2022-09-09 DIAGNOSIS — R82998 Other abnormal findings in urine: Secondary | ICD-10-CM | POA: Diagnosis not present

## 2022-09-09 DIAGNOSIS — N3000 Acute cystitis without hematuria: Secondary | ICD-10-CM | POA: Diagnosis not present

## 2022-09-09 DIAGNOSIS — N898 Other specified noninflammatory disorders of vagina: Secondary | ICD-10-CM | POA: Diagnosis not present

## 2022-09-09 LAB — POC URINALSYSI DIPSTICK (AUTOMATED)
Bilirubin, UA: NEGATIVE
Blood, UA: NEGATIVE
Glucose, UA: NEGATIVE
Nitrite, UA: NEGATIVE
Protein, UA: NEGATIVE
Spec Grav, UA: 1.02 (ref 1.010–1.025)
Urobilinogen, UA: 0.2 E.U./dL
pH, UA: 6 (ref 5.0–8.0)

## 2022-09-09 MED ORDER — CEPHALEXIN 500 MG PO CAPS: 500.0000 mg | ORAL_CAPSULE | Freq: Two times a day (BID) | ORAL | 0 refills | Status: AC

## 2022-09-09 NOTE — Progress Notes (Signed)
Established Patient Office Visit  Subjective:   Patient ID: [name], female    DOB: 23-Nov-1940  Age: 82 y.o. MRN: [id number]  CC:  Chief Complaint  Patient presents with   Dysuria    HPI: [name] is a 82 y.o. female presenting on 09/09/2022 for Dysuria  For the last four days with dysuria , lower abdominal pain, and   She is not sure if she has any vaginal discharge.  She isn't wearing underwear today because she states has been uncomfortable. 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      ROS: Negative unless specifically indicated above in HPI.   Relevant past medical history reviewed and updated as indicated.   Allergies and medications reviewed and updated.   Current Outpatient Medications:    acetaminophen (TYLENOL) 500 MG tablet, Take 500 mg by mouth every 6 (six) hours as needed., Disp: , Rfl:    b complex vitamins tablet, Take 1 tablet by mouth daily., Disp: , Rfl:    Calcium Carb-Cholecalciferol (CALCIUM 600 + D PO), Take 1 capsule by mouth in the morning and at bedtime., Disp: , Rfl:    cephALEXin (KEFLEX) 500 MG capsule, Take 1 capsule (500 mg total) by mouth 2 (two) times daily for 5 days., Disp: 10 capsule, Rfl: 0   ibuprofen (ADVIL) 200 MG tablet, Take 200 mg by mouth every 6 (six) hours as needed., Disp: , Rfl:    LUMIGAN 0.01 % SOLN, Place 1 drop into both eyes at bedtime., Disp: , Rfl:    Multiple Vitamin (MULTIVITAMIN WITH MINERALS) TABS tablet, Take 1 tablet by mouth daily. , Disp: , Rfl:    Multiple Vitamins-Minerals (HAIR SKIN AND NAILS FORMULA PO), Take by mouth., Disp: , Rfl:    rosuvastatin (CRESTOR) 20 MG tablet, Take 1 tablet (20 mg total) by mouth daily., Disp: 90 tablet, Rfl: 1   solifenacin (VESICARE) 5 MG tablet, TAKE 1 TABLET (5 MG TOTAL) BY  MOUTH DAILY., Disp: 90 tablet, Rfl: 0   Zinc Sulfate (ZINC 15 PO), Take by mouth., Disp: , Rfl:   Allergies  Allergen Reactions   Salmon [Fish Allergy] Anaphylaxis    This happens with "CANNED SALMON" only, if eaten uncooked   Ampicillin Nausea Only    Has patient had a PCN reaction causing immediate rash, facial/tongue/throat swelling, SOB or lightheadedness with hypotension: No Has patient had a PCN reaction causing severe rash involving mucus membranes or skin necrosis: No Has patient had a PCN reaction that required hospitalization: No Has patient had a PCN reaction occurring within the last 10 years: No If all of the above answers are "NO", then may proceed with Cephalosporin use.    Codeine Nausea And Vomiting   Flagyl [Metronidazole] Nausea And Vomiting    Patient was taking this along with Neomycin and could not differentiate which med triggered the nausea & vomiting   Meperidine Hcl Nausea And Vomiting   Neomycin Nausea And Vomiting    Patient was taking this along with Flagyl and could not differentiate which med triggered the nausea & vomiting   Propoxyphene Hcl Nausea And Vomiting   Sulfa Antibiotics Hives   Tramadol Hcl Nausea And Vomiting    Objective:   BP 108/70 (BP Location: Left Arm, Patient Position:  Sitting, Cuff Size: Normal)   Pulse 78   Temp 98 F (36.7 C) (Temporal)   Ht 5' 3.5" (1.613 m)   Wt 157 lb (71.2 kg)   SpO2 97%   BMI 27.38 kg/m    Physical Exam Constitutional:      General: She is not in acute distress.    Appearance: Normal appearance. She is normal weight. She is not ill-appearing, toxic-appearing or diaphoretic.  Cardiovascular:     Rate and Rhythm: Normal rate.  Pulmonary:     Effort: Pulmonary effort is normal.  Abdominal:     General: Abdomen is flat.     Tenderness: There is abdominal tenderness (mild bil suprapubic tenderness). There is no right CVA tenderness or left CVA tenderness.  Genitourinary:    Labia:        Right: No  rash or tenderness.        Left: No rash or tenderness.      Vagina: Erythema and tenderness present. No vaginal discharge or lesions.  Neurological:     General: No focal deficit present.     Mental Status: She is alert and oriented to person, place, and time. Mental status is at baseline.     Motor: No weakness.  Psychiatric:        Mood and Affect: Mood normal.        Behavior: Behavior normal.        Thought Content: Thought content normal.        Judgment: Judgment normal.     Assessment & Plan:  Dysuria -     POCT Urinalysis Dipstick (Automated) -     Urine Culture  Vaginal itching -     WET PREP BY MOLECULAR PROBE  Urine WBC increased -     Urine Culture  Acute cystitis without hematuria Assessment & Plan: Mild leukocytes in urine, pending urine culture however with nature of symptoms will choose to start treatment for UTI and may d/c pending negative urine culture.  Wet swab ordered to r/o BV yeast.  Pt advised of red flag symptoms to include vomiting, chills, sharp back pain and when to seek more urgent care.   Orders: -     Cephalexin; Take 1 capsule (500 mg total) by mouth 2 (two) times daily for 5 days.  Dispense: 10 capsule; Refill: 0     Follow up plan: Return for f/u PCP if no improvement in symptoms.  Mort Sawyers, FNP

## 2022-09-09 NOTE — Assessment & Plan Note (Signed)
Mild leukocytes in urine, pending urine culture however with nature of symptoms will choose to start treatment for UTI and may d/c pending negative urine culture.  Wet swab ordered to r/o BV yeast.  Pt advised of red flag symptoms to include vomiting, chills, sharp back pain and when to seek more urgent care.

## 2022-09-10 LAB — WET PREP BY MOLECULAR PROBE
Candida species: NOT DETECTED
Gardnerella vaginalis: NOT DETECTED
MICRO NUMBER:: 15217649
SPECIMEN QUALITY:: ADEQUATE
Trichomonas vaginosis: NOT DETECTED

## 2022-09-10 NOTE — Progress Notes (Signed)
Wet prep negative for yeast and or bacterial vaginosis Pending results of urine culture

## 2022-09-11 LAB — URINE CULTURE
MICRO NUMBER:: 15217650
SPECIMEN QUALITY:: ADEQUATE

## 2022-09-11 LAB — WET PREP BY MOLECULAR PROBE

## 2022-09-13 ENCOUNTER — Telehealth: Payer: Self-pay | Admitting: Family Medicine

## 2022-09-13 NOTE — Telephone Encounter (Signed)
See result note for further documentation.

## 2022-09-13 NOTE — Progress Notes (Signed)
The medication I sent you home with over the weekend should be good for your UTI that came back positive!

## 2022-09-13 NOTE — Telephone Encounter (Signed)
Patient called in returning call she returned. Relayed message to patient regarding results. Patient stated that today is her last day on the medication. She stated that if she needs more to give her a call and let her know. Thank you!

## 2022-09-22 ENCOUNTER — Telehealth: Payer: Self-pay | Admitting: Family Medicine

## 2022-09-22 ENCOUNTER — Other Ambulatory Visit: Payer: Self-pay | Admitting: Family

## 2022-09-22 DIAGNOSIS — N3000 Acute cystitis without hematuria: Secondary | ICD-10-CM

## 2022-09-22 NOTE — Telephone Encounter (Signed)
error 

## 2022-09-23 ENCOUNTER — Ambulatory Visit (INDEPENDENT_AMBULATORY_CARE_PROVIDER_SITE_OTHER): Payer: Medicare HMO | Admitting: Family Medicine

## 2022-09-23 ENCOUNTER — Encounter: Payer: Self-pay | Admitting: Family Medicine

## 2022-09-23 VITALS — BP 110/58 | HR 62 | Temp 97.9°F | Ht 63.5 in | Wt 159.0 lb

## 2022-09-23 DIAGNOSIS — R300 Dysuria: Secondary | ICD-10-CM

## 2022-09-23 DIAGNOSIS — R350 Frequency of micturition: Secondary | ICD-10-CM | POA: Diagnosis not present

## 2022-09-23 DIAGNOSIS — N3000 Acute cystitis without hematuria: Secondary | ICD-10-CM

## 2022-09-23 LAB — POC URINALSYSI DIPSTICK (AUTOMATED)
Bilirubin, UA: NEGATIVE
Blood, UA: NEGATIVE
Glucose, UA: NEGATIVE
Ketones, UA: NEGATIVE
Leukocytes, UA: NEGATIVE
Nitrite, UA: NEGATIVE
Protein, UA: NEGATIVE
Spec Grav, UA: 1.02 (ref 1.010–1.025)
Urobilinogen, UA: 0.2 E.U./dL
pH, UA: 5.5 (ref 5.0–8.0)

## 2022-09-23 NOTE — Progress Notes (Signed)
Subjective:    Patient ID: [name], female    DOB: [birth year], 82 y.o.   MRN: [id number]  HPI  Wt Readings from Last 3 Encounters:  09/23/22 159 lb (72.1 kg)  09/09/22 157 lb (71.2 kg)  06/22/22 157 lb 8 oz (71.4 kg)   27.72 kg/m  Vitals:   09/23/22 1146  BP: (!) 110/58  Pulse: 62  Temp: 97.9 F (36.6 C)  SpO2: 96%     Pt presents for uti symptoms and joint pain  Had uti in mid July and it was treated    Yesterday started some increased bladder symptoms (did get better with last one)  Sensitive in urethra - not quite burning yet  More frequency   No fever but has been achey  No nausea  No flank pain   Is drinking water a lot (moreso at night when less busy)    All of her joints hurt more than usual   No tick bites this year   Takes cranberry pills twice daily    Results for orders placed or performed in visit on 09/23/22  POCT Urinalysis Dipstick (Automated)  Result Value Ref Range   Color, UA yellow    Clarity, UA clear    Glucose, UA Negative Negative   Bilirubin, UA neg    Ketones, UA neg    Spec Grav, UA 1.020 1.010 - 1.025   Blood, UA neg    pH, UA 5.5 5.0 - 8.0   Protein, UA Negative Negative   Urobilinogen, UA 0.2 0.2 or 1.0 E.U./dL   Nitrite, UA neg    Leukocytes, UA Negative Negative   Her symptoms are improved      Takes vesicare 5 mg for overactive bladder issues    Last visit with Lily Peer NP    Lab Results  Component Value Date   COLORU yellow 09/23/2022   CLARITYU clear 09/23/2022   GLUCOSEUR Negative 09/23/2022   BILIRUBINUR neg 09/23/2022   KETONESU neg 09/23/2022   SPECGRAV 1.020 09/23/2022   RBCUR neg 09/23/2022   PHUR 5.5 09/23/2022   PROTEINUR Negative 09/23/2022   UROBILINOGEN 0.2 09/23/2022   LEUKOCYTESUR Negative 09/23/2022   Urine culture 7/18  Had pan sensitive e coli treated with keflex  Saw Np Dugal and note reviewed  Wet prep at that visit was normal   Renal function  Lab  Results  Component Value Date   NA 139 08/18/2022   K 4.3 08/18/2022   CO2 26 08/18/2022   GLUCOSE 106 (H) 08/18/2022   BUN 24 (H) 08/18/2022   CREATININE 1.12 08/18/2022   CALCIUM 9.9 08/18/2022   GFR 45.29 (L) 08/18/2022   EGFR >60 02/21/2017   GFRNONAA 49 (L) 08/31/2021  Down for 49      Patient Active Problem List   Diagnosis Date Noted   Dysuria 09/09/2022   Acute cystitis without hematuria 09/09/2022   Onychomycosis 06/22/2022   COVID-19 04/13/2022   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   Bruise 03/15/2022   Hammer toes of both feet 03/15/2022   Knee pain 12/28/2021   Decreased GFR 07/21/2021   Palpitations 06/24/2021   History of colon cancer 04/02/2020   Atherosclerosis of aorta (HCC) 04/02/2020   Blood clotting disorder (HCC) 06/30/2018   Fall 10/28/2017   Genetic testing 04/25/2017   History of DVT (deep vein thrombosis) 11/24/2016   Cancer of overlapping sites of colon (HCC) 09/23/2016   Estrogen deficiency 07/30/2016   Screening mammogram, encounter  for 07/30/2016   Routine general medical examination at a health care facility 07/27/2016   H/O: CVA (cerebrovascular accident) 05/06/2015   HTN (hypertension) 05/06/2015   HLD (hyperlipidemia) 05/06/2015   GERD (gastroesophageal reflux disease) 05/06/2015   CAD (coronary artery disease) 05/06/2015   Urinary frequency 12/28/2013   Seborrheic keratosis 07/05/2012   Herpes simplex 12/29/2010   Callus of foot 10/16/2010   Coronary artery disease, non-occlusive 10/16/2010   Insomnia 02/02/2010   HAND PAIN, BILATERAL 01/09/2010   Prediabetes 07/29/2009   ALLERGIC RHINITIS 12/04/2008   Vitamin D deficiency 04/30/2008   COLONIC POLYPS 06/28/2007   IBS 03/17/2007   INTERSTITIAL CYSTITIS 03/17/2007   Osteoporosis 03/17/2007   Past Medical History:  Diagnosis Date   Anemia    Arthritis    Astigmatism    both eyes   Cancer (HCC)    colon surgery done last chemo tx 01-17-17   Cataract     Colon cancer (HCC)    Coronary artery disease, non-occlusive    Dyspnea    more so exertion   Dysrhythmia    skipped beats every now and then   GERD (gastroesophageal reflux disease)    Glaucoma    both eyes right eye worse than left   Hammer toes of both feet    Headache    migraines with aura   Heel spur    both heels and front   HSV (herpes simplex virus) infection    Hyperglycemia    denies   Hyperlipidemia    Hypertension    IBS (irritable bowel syndrome)    IC (interstitial cystitis)    Insomnia disorder related to known organic factor    Osteoporosis    PONV (postoperative nausea and vomiting)    has had more surgeries, and had no problems   Stroke [facility]) 2016   Vitamin D deficiency    Past Surgical History:  Procedure Laterality Date   ABDOMINAL HYSTERECTOMY     1 ovary and womb removed   CARDIAC CATHETERIZATION  7/12   non obst dz -- not sure what yr   CHOLECYSTECTOMY     EUS N/A 02/03/2017   Procedure: UPPER ENDOSCOPIC ULTRASOUND (EUS) LINEAR;  Surgeon: Rachael Fee, MD;  Location: [facility];  Service: Endoscopy;  Laterality: N/A;   EYE SURGERY Bilateral    ioc for cataracts    LAPAROSCOPIC RIGHT COLECTOMY Right 11/10/2016   Procedure: LAPAROSCOPIC EXTENDED RIGHT COLECTOMY;  Surgeon: Almond Lint, MD;  Location: [facility];  Service: General;  Laterality: Right;   PORT-A-CATH REMOVAL Left 11/21/2019   Procedure: REMOVAL PORT-A-CATH;  Surgeon: Almond Lint, MD;  Location: [facility];  Service: General;  Laterality: Left;   PORTACATH PLACEMENT Left 12/22/2016   Procedure: INSERTION PORT-A-CATH;  Surgeon: Almond Lint, MD;  Location: [facility];  Service: General;  Laterality: Left;   ROTATOR CUFF REPAIR Bilateral    Social History   Tobacco Use   Smoking status: Never   Smokeless tobacco: Never  Vaping Use   Vaping status: Never Used  Substance Use Topics   Alcohol use: No    Alcohol/week: 0.0 standard drinks of alcohol    Drug use: No   Family History  Problem Relation Age of Onset   Alcohol abuse Mother    Heart attack Mother    GER disease Mother    GER disease Father    Alcohol abuse Father    Stroke Father    Tuberculosis Father    Heart  attack Father    Aneurysm Father    Mental illness Sister    Depression Sister    Breast cancer Neg Hx    Allergies  Allergen Reactions   Salmon [Fish Allergy] Anaphylaxis    This happens with "CANNED SALMON" only, if eaten uncooked   Ampicillin Nausea Only    Has patient had a PCN reaction causing immediate rash, facial/tongue/throat swelling, SOB or lightheadedness with hypotension: No Has patient had a PCN reaction causing severe rash involving mucus membranes or skin necrosis: No Has patient had a PCN reaction that required hospitalization: No Has patient had a PCN reaction occurring within the last 10 years: No If all of the above answers are "NO", then may proceed with Cephalosporin use.    Codeine Nausea And Vomiting   Flagyl [Metronidazole] Nausea And Vomiting    Patient was taking this along with Neomycin and could not differentiate which med triggered the nausea & vomiting   Meperidine Hcl Nausea And Vomiting   Neomycin Nausea And Vomiting    Patient was taking this along with Flagyl and could not differentiate which med triggered the nausea & vomiting   Propoxyphene Hcl Nausea And Vomiting   Sulfa Antibiotics Hives   Tramadol Hcl Nausea And Vomiting   Current Outpatient Medications on File Prior to Visit  Medication Sig Dispense Refill   acetaminophen (TYLENOL) 500 MG tablet Take 500 mg by mouth every 6 (six) hours as needed.     b complex vitamins tablet Take 1 tablet by mouth daily.     Calcium Carb-Cholecalciferol (CALCIUM 600 + D PO) Take 1 capsule by mouth in the morning and at bedtime.     ibuprofen (ADVIL) 200 MG tablet Take 200 mg by mouth every 6 (six) hours as needed.     LUMIGAN 0.01 % SOLN Place 1 drop into both eyes at  bedtime.     Multiple Vitamin (MULTIVITAMIN WITH MINERALS) TABS tablet Take 1 tablet by mouth daily.      Multiple Vitamins-Minerals (HAIR SKIN AND NAILS FORMULA PO) Take by mouth.     rosuvastatin (CRESTOR) 20 MG tablet Take 1 tablet (20 mg total) by mouth daily. 90 tablet 1   solifenacin (VESICARE) 5 MG tablet TAKE 1 TABLET (5 MG TOTAL) BY MOUTH DAILY. 90 tablet 0   Zinc Sulfate (ZINC 15 PO) Take by mouth.     No current facility-administered medications on file prior to visit.    Review of Systems  Constitutional:  Negative for activity change, appetite change, fatigue, fever and unexpected weight change.  HENT:  Negative for congestion, ear pain, rhinorrhea, sinus pressure and sore throat.   Eyes:  Negative for pain, redness and visual disturbance.  Respiratory:  Negative for cough, shortness of breath and wheezing.   Cardiovascular:  Negative for chest pain and palpitations.  Gastrointestinal:  Negative for abdominal pain, blood in stool, constipation and diarrhea.  Endocrine: Negative for polydipsia and polyuria.  Genitourinary:  Positive for frequency and urgency. Negative for decreased urine volume, dysuria and hematuria.  Musculoskeletal:  Positive for arthralgias and myalgias. Negative for back pain.       Achey last night Improved today  Skin:  Negative for pallor and rash.  Allergic/Immunologic: Negative for environmental allergies.  Neurological:  Negative for dizziness, syncope and headaches.  Hematological:  Negative for adenopathy. Does not bruise/bleed easily.  Psychiatric/Behavioral:  Negative for decreased concentration and dysphoric mood. The patient is not nervous/anxious.        Objective:  Physical Exam Constitutional:      General: She is not in acute distress.    Appearance: Normal appearance. She is well-developed and normal weight. She is not ill-appearing or diaphoretic.  HENT:     Head: Normocephalic and atraumatic.  Eyes:     Conjunctiva/sclera:  Conjunctivae normal.     Pupils: Pupils are equal, round, and reactive to light.  Cardiovascular:     Rate and Rhythm: Normal rate and regular rhythm.     Heart sounds: Normal heart sounds.  Pulmonary:     Effort: Pulmonary effort is normal.     Breath sounds: Normal breath sounds.  Abdominal:     General: Bowel sounds are normal. There is no distension.     Palpations: Abdomen is soft. There is no mass.     Tenderness: There is abdominal tenderness. There is no right CVA tenderness, left CVA tenderness, guarding or rebound.     Comments: No cva tenderness  Mild suprapubic tenderness  Musculoskeletal:     Cervical back: Normal range of motion and neck supple.     Comments: OA changes in hands and feet  Lymphadenopathy:     Cervical: No cervical adenopathy.  Skin:    Findings: No rash.  Neurological:     Mental Status: She is alert.           Assessment & Plan:   Problem List Items Addressed This Visit       Genitourinary   Acute cystitis without hematuria    Was treatment with keflex mid July (pan sensitive e coli) Improved then return of some symptoms yesterday Urinalysis clear today (culture pending)   Encouraged good hydration  Continue cranberry supplements if well tolerated         Other   Urinary frequency - Primary    This with some urination pressure yesterday  Improved today Was treatment for e coli uti in July   Urinalysis clear U culture pending   Encouraged better hydration both DAY and night  Continue cranberry  Update if not improved  Continues vesicare for overactive bladder symptoms       Relevant Orders   Urine Culture   Dysuria   Relevant Orders   POCT Urinalysis Dipstick (Automated) (Completed)   Urine Culture

## 2022-09-23 NOTE — Assessment & Plan Note (Signed)
This with some urination pressure yesterday  Improved today Was treatment for e coli uti in July   Urinalysis clear U culture pending   Encouraged better hydration both DAY and night  Continue cranberry  Update if not improved  Continues vesicare for overactive bladder symptoms

## 2022-09-23 NOTE — Patient Instructions (Addendum)
Urine is completely clear today  (I still sent it for a culture)  This is reassuring   Perhaps you started to uti yesterday but knocked it out with fluids and cranberry  Please let us know if symptoms return  Try and stay hydrated (more during the day)    Try to get most of your carbohydrates from produce (with the exception of white potatoes) and whole grains Eat less bread/pasta/rice/snack foods/cereals/sweets and other items from the middle of the grocery store (processed carbs)   If your joint pain returns or you notice a rash or new tick bite or fever let us know  Also watch your temperature at night   Take care of yourself

## 2022-09-23 NOTE — Assessment & Plan Note (Addendum)
Was treatment with keflex mid July (pan sensitive e coli) Reviewed note from NP Dugal as well as prior urinalysis and urine culture Improved then return of some symptoms yesterday Urinalysis clear today (culture pending)   Encouraged good hydration  Continue cranberry supplements if well tolerated

## 2022-09-30 ENCOUNTER — Ambulatory Visit (INDEPENDENT_AMBULATORY_CARE_PROVIDER_SITE_OTHER): Payer: Medicare HMO | Admitting: Family Medicine

## 2022-09-30 ENCOUNTER — Ambulatory Visit (INDEPENDENT_AMBULATORY_CARE_PROVIDER_SITE_OTHER)
Admission: RE | Admit: 2022-09-30 | Discharge: 2022-09-30 | Disposition: A | Discharge status: Home / Self Care | Payer: Medicare HMO | Source: Ambulatory Visit | Attending: Family Medicine | Admitting: Family Medicine

## 2022-09-30 ENCOUNTER — Encounter: Payer: Self-pay | Admitting: Family Medicine

## 2022-09-30 VITALS — BP 110/60 | HR 59 | Temp 97.6°F | Ht 63.5 in | Wt 159.4 lb

## 2022-09-30 DIAGNOSIS — M25561 Pain in right knee: Secondary | ICD-10-CM

## 2022-09-30 DIAGNOSIS — G8929 Other chronic pain: Secondary | ICD-10-CM | POA: Diagnosis not present

## 2022-09-30 DIAGNOSIS — M1711 Unilateral primary osteoarthritis, right knee: Secondary | ICD-10-CM | POA: Diagnosis not present

## 2022-09-30 DIAGNOSIS — M25461 Effusion, right knee: Secondary | ICD-10-CM | POA: Diagnosis not present

## 2022-09-30 NOTE — Assessment & Plan Note (Addendum)
Acute on chronic  Medial and posterior  Trauma a year ago -kicked by a horse   Xray now noted severe degenerative joint disease laterally /no fracture  Advised voltaren gel instead of oral nsaid due to renal function  Ice Elevate Compression if helpful   Will refer to orthopedics

## 2022-09-30 NOTE — Patient Instructions (Signed)
For pain  Try voltaren gel 1% over the counter  Also tylenol   Avoid ibuprofen for now due to kidney health   Put ice /cold compress on your knee 10 minutes whenever you can   You can use a compression band or wrap -this may help pain and swelling when you are up and around   Xray today  I hope to have an xray report in the next 24 hours We will call you with result and plan

## 2022-09-30 NOTE — Progress Notes (Signed)
Subjective:    Patient ID: [name], female    DOB: Oct 30, 1940, 82 y.o.   MRN: [id number]  HPI  Wt Readings from Last 3 Encounters:  09/30/22 159 lb 6.4 oz (72.3 kg)  09/23/22 159 lb (72.1 kg)  09/09/22 157 lb (71.2 kg)   27.79 kg/m  Vitals:   09/30/22 1145  BP: 110/60  Pulse: (!) 59  Temp: 97.6 F (36.4 C)  SpO2: 99%      Pt presents with c/o knee pain  Right  Acute on chronic   Right knee pain  Past history of trauma/ kicked by a horse last year - bothering her more since then  Some swelling Some warmth  Sometimes the leg hurts as well as knee  Her varicose veins are worse   Pain is medial and posterior  Ibuprofen over the counter is helpful   Is wearing orthotic from [employer]  Also hip hurts   Has not used ice or heat   Worse when standing and walking Better when sitting     Is interested in gel shot in the future if she is a candidate   Imaging  DG Knee 4 Views W/Patella Right  Result Date: 09/30/2022 CLINICAL DATA:  Acute on chronic right knee pain after injury last year. EXAM: RIGHT KNEE - COMPLETE 4+ VIEW COMPARISON:  None Available. FINDINGS: No evidence of fracture or dislocation. Small suprapatellar joint effusion is noted. Severe narrowing and osteophyte formation of lateral joint space is noted. Soft tissues are unremarkable. IMPRESSION: No fracture or dislocation. Severe degenerative joint disease is noted laterally. Electronically Signed   By: Lupita Raider M.D.   On: 09/30/2022 12:39      Patient Active Problem List   Diagnosis Date Noted   Dysuria 09/09/2022   Acute cystitis without hematuria 09/09/2022   Onychomycosis 06/22/2022   COVID-19 04/13/2022   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   Bruise 03/15/2022   Hammer toes of both feet 03/15/2022   Right knee pain 12/28/2021   Decreased GFR 07/21/2021   Palpitations 06/24/2021   History of colon cancer 04/02/2020   Atherosclerosis of aorta  (HCC) 04/02/2020   Blood clotting disorder (HCC) 06/30/2018   Fall 10/28/2017   Genetic testing 04/25/2017   History of DVT (deep vein thrombosis) 11/24/2016   Cancer of overlapping sites of colon (HCC) 09/23/2016   Estrogen deficiency 07/30/2016   Screening mammogram, encounter for 07/30/2016   Routine general medical examination at a health care facility 07/27/2016   H/O: CVA (cerebrovascular accident) 05/06/2015   HTN (hypertension) 05/06/2015   HLD (hyperlipidemia) 05/06/2015   GERD (gastroesophageal reflux disease) 05/06/2015   CAD (coronary artery disease) 05/06/2015   Urinary frequency 12/28/2013   Seborrheic keratosis 07/05/2012   Herpes simplex 12/29/2010   Callus of foot 10/16/2010   Coronary artery disease, non-occlusive 10/16/2010   Insomnia 02/02/2010   HAND PAIN, BILATERAL 01/09/2010   Prediabetes 07/29/2009   ALLERGIC RHINITIS 12/04/2008   Vitamin D deficiency 04/30/2008   COLONIC POLYPS 06/28/2007   IBS 03/17/2007   INTERSTITIAL CYSTITIS 03/17/2007   Osteoporosis 03/17/2007   Past Medical History:  Diagnosis Date   Anemia    Arthritis    Astigmatism    both eyes   Cancer (HCC)    colon surgery done last chemo tx 01-17-17   Cataract    Colon cancer (HCC)    Coronary artery disease, non-occlusive    Dyspnea    more so exertion  Dysrhythmia    skipped beats every now and then   GERD (gastroesophageal reflux disease)    Glaucoma    both eyes right eye worse than left   Hammer toes of both feet    Headache    migraines with aura   Heel spur    both heels and front   HSV (herpes simplex virus) infection    Hyperglycemia    denies   Hyperlipidemia    Hypertension    IBS (irritable bowel syndrome)    IC (interstitial cystitis)    Insomnia disorder related to known organic factor    Osteoporosis    PONV (postoperative nausea and vomiting)    has had more surgeries, and had no problems   Stroke [facility]) 2016   Vitamin D deficiency    Past Surgical  History:  Procedure Laterality Date   ABDOMINAL HYSTERECTOMY     1 ovary and womb removed   CARDIAC CATHETERIZATION  7/12   non obst dz -- not sure what yr   CHOLECYSTECTOMY     EUS N/A 02/03/2017   Procedure: UPPER ENDOSCOPIC ULTRASOUND (EUS) LINEAR;  Surgeon: Rachael Fee, MD;  Location: [facility];  Service: Endoscopy;  Laterality: N/A;   EYE SURGERY Bilateral    ioc for cataracts    LAPAROSCOPIC RIGHT COLECTOMY Right 11/10/2016   Procedure: LAPAROSCOPIC EXTENDED RIGHT COLECTOMY;  Surgeon: Almond Lint, MD;  Location: [facility];  Service: General;  Laterality: Right;   PORT-A-CATH REMOVAL Left 11/21/2019   Procedure: REMOVAL PORT-A-CATH;  Surgeon: Almond Lint, MD;  Location: [facility];  Service: General;  Laterality: Left;   PORTACATH PLACEMENT Left 12/22/2016   Procedure: INSERTION PORT-A-CATH;  Surgeon: Almond Lint, MD;  Location: [facility];  Service: General;  Laterality: Left;   ROTATOR CUFF REPAIR Bilateral    Social History   Tobacco Use   Smoking status: Never   Smokeless tobacco: Never  Vaping Use   Vaping status: Never Used  Substance Use Topics   Alcohol use: No    Alcohol/week: 0.0 standard drinks of alcohol   Drug use: No   Family History  Problem Relation Age of Onset   Alcohol abuse Mother    Heart attack Mother    GER disease Mother    GER disease Father    Alcohol abuse Father    Stroke Father    Tuberculosis Father    Heart attack Father    Aneurysm Father    Mental illness Sister    Depression Sister    Breast cancer Neg Hx    Allergies  Allergen Reactions   Salmon [Fish Allergy] Anaphylaxis    This happens with "CANNED SALMON" only, if eaten uncooked   Ampicillin Nausea Only    Has patient had a PCN reaction causing immediate rash, facial/tongue/throat swelling, SOB or lightheadedness with hypotension: No Has patient had a PCN reaction causing severe rash involving mucus membranes or skin necrosis: No Has  patient had a PCN reaction that required hospitalization: No Has patient had a PCN reaction occurring within the last 10 years: No If all of the above answers are "NO", then may proceed with Cephalosporin use.    Codeine Nausea And Vomiting   Flagyl [Metronidazole] Nausea And Vomiting    Patient was taking this along with Neomycin and could not differentiate which med triggered the nausea & vomiting   Meperidine Hcl Nausea And Vomiting   Neomycin Nausea And Vomiting    Patient was taking  this along with Flagyl and could not differentiate which med triggered the nausea & vomiting   Propoxyphene Hcl Nausea And Vomiting   Sulfa Antibiotics Hives   Tramadol Hcl Nausea And Vomiting   Current Outpatient Medications on File Prior to Visit  Medication Sig Dispense Refill   acetaminophen (TYLENOL) 500 MG tablet Take 500 mg by mouth every 6 (six) hours as needed.     b complex vitamins tablet Take 1 tablet by mouth daily.     Calcium Carb-Cholecalciferol (CALCIUM 600 + D PO) Take 1 capsule by mouth in the morning and at bedtime.     ibuprofen (ADVIL) 200 MG tablet Take 200 mg by mouth every 6 (six) hours as needed.     LUMIGAN 0.01 % SOLN Place 1 drop into both eyes at bedtime.     Multiple Vitamin (MULTIVITAMIN WITH MINERALS) TABS tablet Take 1 tablet by mouth daily.      Multiple Vitamins-Minerals (HAIR SKIN AND NAILS FORMULA PO) Take by mouth.     rosuvastatin (CRESTOR) 20 MG tablet Take 1 tablet (20 mg total) by mouth daily. 90 tablet 1   solifenacin (VESICARE) 5 MG tablet TAKE 1 TABLET (5 MG TOTAL) BY MOUTH DAILY. 90 tablet 0   Zinc Sulfate (ZINC 15 PO) Take by mouth.     No current facility-administered medications on file prior to visit.    Review of Systems  Constitutional:  Negative for activity change, appetite change, fatigue, fever and unexpected weight change.  HENT:  Negative for congestion, ear pain, rhinorrhea, sinus pressure and sore throat.   Eyes:  Negative for pain, redness  and visual disturbance.  Respiratory:  Negative for cough, shortness of breath and wheezing.   Cardiovascular:  Negative for chest pain and palpitations.  Gastrointestinal:  Negative for abdominal pain, blood in stool, constipation and diarrhea.  Endocrine: Negative for polydipsia and polyuria.  Genitourinary:  Negative for dysuria, frequency and urgency.  Musculoskeletal:  Positive for arthralgias. Negative for back pain and myalgias.       Right knee pain   Skin:  Negative for pallor and rash.  Allergic/Immunologic: Negative for environmental allergies.  Neurological:  Negative for dizziness, syncope and headaches.  Hematological:  Negative for adenopathy. Does not bruise/bleed easily.  Psychiatric/Behavioral:  Negative for decreased concentration and dysphoric mood. The patient is not nervous/anxious.        Objective:   Physical Exam Constitutional:      General: She is not in acute distress.    Appearance: Normal appearance. She is not ill-appearing.     Comments: Overweight   Eyes:     Conjunctiva/sclera: Conjunctivae normal.     Pupils: Pupils are equal, round, and reactive to light.  Cardiovascular:     Rate and Rhythm: Regular rhythm. Bradycardia present.  Musculoskeletal:     Comments: Knee right  No swelling or obvious effusion  slight warmth to the touch  No crepitus  ROM: -limited flexion due to pain  Flex limited  Ext -full /some discomfort  Mcmurray-causes posterior pain  Bounce test -normal   Stability: Anterior drawer-normal  Lachman exam normal , good endpoint   Tenderness : medial joint line and posterior   Gait favors LLE     Skin:    General: Skin is warm and dry.     Findings: No bruising, erythema or rash.  Neurological:     Mental Status: She is alert.     Sensory: No sensory deficit.     Motor:  No weakness.  Psychiatric:        Mood and Affect: Mood normal.           Assessment & Plan:   Problem List Items Addressed This Visit        Other   Right knee pain - Primary    Acute on chronic  Medial and posterior  Trauma a year ago -kicked by a horse   Xray now noted severe degenerative joint disease laterally /no fracture  Advised voltaren gel instead of oral nsaid due to renal function  Ice Elevate Compression if helpful   Will refer to orthopedics            Relevant Orders   DG Knee 4 Views W/Patella Right (Completed)

## 2022-10-06 ENCOUNTER — Ambulatory Visit: Payer: Medicare HMO | Admitting: Surgical

## 2022-10-06 DIAGNOSIS — R32 Unspecified urinary incontinence: Secondary | ICD-10-CM | POA: Diagnosis not present

## 2022-10-06 DIAGNOSIS — I739 Peripheral vascular disease, unspecified: Secondary | ICD-10-CM | POA: Diagnosis not present

## 2022-10-06 DIAGNOSIS — Z8673 Personal history of transient ischemic attack (TIA), and cerebral infarction without residual deficits: Secondary | ICD-10-CM | POA: Diagnosis not present

## 2022-10-06 DIAGNOSIS — Z008 Encounter for other general examination: Secondary | ICD-10-CM | POA: Diagnosis not present

## 2022-10-06 DIAGNOSIS — H409 Unspecified glaucoma: Secondary | ICD-10-CM | POA: Diagnosis not present

## 2022-10-06 DIAGNOSIS — K5190 Ulcerative colitis, unspecified, without complications: Secondary | ICD-10-CM | POA: Diagnosis not present

## 2022-10-06 DIAGNOSIS — G8929 Other chronic pain: Secondary | ICD-10-CM | POA: Diagnosis not present

## 2022-10-06 DIAGNOSIS — I499 Cardiac arrhythmia, unspecified: Secondary | ICD-10-CM | POA: Diagnosis not present

## 2022-10-06 DIAGNOSIS — E785 Hyperlipidemia, unspecified: Secondary | ICD-10-CM | POA: Diagnosis not present

## 2022-10-06 DIAGNOSIS — I2510 Atherosclerotic heart disease of native coronary artery without angina pectoris: Secondary | ICD-10-CM | POA: Diagnosis not present

## 2022-10-06 DIAGNOSIS — I10 Essential (primary) hypertension: Secondary | ICD-10-CM | POA: Diagnosis not present

## 2022-10-06 DIAGNOSIS — Z823 Family history of stroke: Secondary | ICD-10-CM | POA: Diagnosis not present

## 2022-10-06 DIAGNOSIS — Z8249 Family history of ischemic heart disease and other diseases of the circulatory system: Secondary | ICD-10-CM | POA: Diagnosis not present

## 2022-10-07 DIAGNOSIS — H401133 Primary open-angle glaucoma, bilateral, severe stage: Secondary | ICD-10-CM | POA: Diagnosis not present

## 2022-10-14 ENCOUNTER — Ambulatory Visit: Payer: Medicare HMO | Admitting: Orthopedic Surgery

## 2022-10-20 ENCOUNTER — Ambulatory Visit: Payer: Medicare HMO | Admitting: Surgical

## 2022-12-07 DIAGNOSIS — H401133 Primary open-angle glaucoma, bilateral, severe stage: Secondary | ICD-10-CM | POA: Diagnosis not present

## 2022-12-31 ENCOUNTER — Other Ambulatory Visit: Payer: Self-pay | Admitting: Family Medicine

## 2023-03-16 ENCOUNTER — Ambulatory Visit (INDEPENDENT_AMBULATORY_CARE_PROVIDER_SITE_OTHER): Payer: Medicare HMO | Admitting: Family Medicine

## 2023-03-16 ENCOUNTER — Encounter: Payer: Self-pay | Admitting: Family Medicine

## 2023-03-16 VITALS — BP 102/48 | HR 60 | Temp 97.6°F | Ht 63.5 in | Wt 149.4 lb

## 2023-03-16 DIAGNOSIS — H409 Unspecified glaucoma: Secondary | ICD-10-CM | POA: Insufficient documentation

## 2023-03-16 DIAGNOSIS — R1030 Lower abdominal pain, unspecified: Secondary | ICD-10-CM | POA: Diagnosis not present

## 2023-03-16 DIAGNOSIS — N3000 Acute cystitis without hematuria: Secondary | ICD-10-CM

## 2023-03-16 DIAGNOSIS — N3010 Interstitial cystitis (chronic) without hematuria: Secondary | ICD-10-CM

## 2023-03-16 DIAGNOSIS — N390 Urinary tract infection, site not specified: Secondary | ICD-10-CM | POA: Insufficient documentation

## 2023-03-16 DIAGNOSIS — B009 Herpesviral infection, unspecified: Secondary | ICD-10-CM

## 2023-03-16 DIAGNOSIS — R350 Frequency of micturition: Secondary | ICD-10-CM

## 2023-03-16 DIAGNOSIS — C188 Malignant neoplasm of overlapping sites of colon: Secondary | ICD-10-CM

## 2023-03-16 LAB — POC URINALSYSI DIPSTICK (AUTOMATED)
Bilirubin, UA: 1
Blood, UA: NEGATIVE
Glucose, UA: NEGATIVE
Ketones, UA: NEGATIVE
Leukocytes, UA: NEGATIVE
Nitrite, UA: NEGATIVE
Protein, UA: NEGATIVE
Spec Grav, UA: >=1.03 — AB (ref 1.010–1.025)
Urobilinogen, UA: 0.2 U/dL
pH, UA: 6 (ref 5.0–8.0)

## 2023-03-16 LAB — POCT UA - MICROSCOPIC ONLY

## 2023-03-16 MED ORDER — CEPHALEXIN 500 MG PO CAPS: 500.0000 mg | ORAL_CAPSULE | Freq: Two times a day (BID) | ORAL | 0 refills | Status: DC

## 2023-03-16 MED ORDER — VALACYCLOVIR HCL 500 MG PO TABS: 500.0000 mg | ORAL_TABLET | Freq: Two times a day (BID) | ORAL | 1 refills | Status: DC

## 2023-03-16 NOTE — Assessment & Plan Note (Signed)
Acute on chronic Some bacteria and wbc on urine micro Will treat with keflex pending culture

## 2023-03-16 NOTE — Progress Notes (Signed)
Subjective:    Patient ID: [name], female    DOB: 1940/02/28, 83 y.o.   MRN: [id number]  HPI  Wt Readings from Last 3 Encounters:  03/16/23 149 lb 6 oz (67.8 kg)  09/30/22 159 lb 6.4 oz (72.3 kg)  09/23/22 159 lb (72.1 kg)   26.05 kg/m  Vitals:   03/16/23 1135  BP: (!) 102/48  Pulse: 60  Temp: 97.6 F (36.4 C)  SpO2: 96%    Pt presents for urinary symptoms  Vision issues  HSV     Pt does have history of IC  Vesicare is on medicine list for overactive bladder with frequency   Took 2 azo last night and it helped  Did not take any today she thinks   Some discomfort over bladder  Dull pain  Frequent urination worse than usual   Does drink a lot of fluid -especially at night  Lot of water  Drank a big glass of water   No burning to urinate  No blood in urine   No fever or nausea  Some diarrhea however  (goes between constipation and diarrhea) Scant blood if if stool is hard and she has to strain    Urinalysis Concentrated   Results for orders placed or performed in visit on 03/16/23  POCT Urinalysis Dipstick (Automated)   Collection Time: 03/16/23 11:57 AM  Result Value Ref Range   Color, UA Yellow    Clarity, UA Clear    Glucose, UA Negative Negative   Bilirubin, UA 1 mg/dL    Ketones, UA Negative    Spec Grav, UA >=1.030 (A) 1.010 - 1.025   Blood, UA Negative    pH, UA 6.0 5.0 - 8.0   Protein, UA Negative Negative   Urobilinogen, UA 0.2 0.2 or 1.0 E.U./dL   Nitrite, UA Negative    Leukocytes, UA Negative Negative  POCT UA - Microscopic Only   Collection Time: 03/16/23 12:13 PM  Result Value Ref Range   WBC, Ur, HPF, POC 5-6 0 - 5   RBC, Urine, Miroscopic 0-1 0 - 2   Bacteria, U Microscopic mod None - Trace   Mucus, UA few    Epithelial cells, urine per micros few    Crystals, Ur, HPF, POC none    Casts, Ur, LPF, POC none    Yeast, UA none         Lab Results  Component Value Date   NA 139 08/18/2022   K 4.3  08/18/2022   CO2 26 08/18/2022   GLUCOSE 106 (H) 08/18/2022   BUN 24 (H) 08/18/2022   CREATININE 1.12 08/18/2022   CALCIUM 9.9 08/18/2022   GFR 45.29 (L) 08/18/2022   EGFR >60 02/21/2017   GFRNONAA 49 (L) 08/31/2021   Colonoscopy 2023  History of colon cancer Recall would be for 5 years if she is healthy enough- her choice    Vision  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  Lost /damaged her glasses Vision is tricky currently  Has appointment upcoming with him/ plans to discuss getting a copy of prescription to take to [facility] to get glasses Until then will reach out to others to drive her places (she dislikes driving to start with)    HSV Gets an occational genital outbreak  Has had a long time  Wants to keep some valtrex on hand for prn /outbreaks  Not having symptoms now    Patient Active Problem List   Diagnosis Date Noted  Lower abdominal pain 03/16/2023   Glaucoma 03/16/2023   Onychomycosis 06/22/2022   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   Bruise 03/15/2022   Hammer toes of both feet 03/15/2022   Right knee pain 12/28/2021   Acute cystitis 12/28/2021   Decreased GFR 07/21/2021   Palpitations 06/24/2021   History of colon cancer 04/02/2020   Atherosclerosis of aorta (HCC) 04/02/2020   Blood clotting disorder (HCC) 06/30/2018   Fall 10/28/2017   Genetic testing 04/25/2017   History of DVT (deep vein thrombosis) 11/24/2016   Cancer of overlapping sites of colon (HCC) 09/23/2016   Estrogen deficiency 07/30/2016   Screening mammogram, encounter for 07/30/2016   Routine general medical examination at a health care facility 07/27/2016   H/O: CVA (cerebrovascular accident) 05/06/2015   HTN (hypertension) 05/06/2015   HLD (hyperlipidemia) 05/06/2015   GERD (gastroesophageal reflux disease) 05/06/2015   CAD (coronary artery disease) 05/06/2015   Urinary frequency 12/28/2013   Seborrheic keratosis 07/05/2012   Herpes simplex 12/29/2010   Callus of  foot 10/16/2010   Coronary artery disease, non-occlusive 10/16/2010   Insomnia 02/02/2010   HAND PAIN, BILATERAL 01/09/2010   Prediabetes 07/29/2009   ALLERGIC RHINITIS 12/04/2008   Vitamin D deficiency 04/30/2008   COLONIC POLYPS 06/28/2007   IBS 03/17/2007   INTERSTITIAL CYSTITIS 03/17/2007   Osteoporosis 03/17/2007   Past Medical History:  Diagnosis Date   Anemia    Arthritis    Astigmatism    both eyes   Cancer (HCC)    colon surgery done last chemo tx 01-17-17   Cataract    Colon cancer (HCC)    Coronary artery disease, non-occlusive    Dyspnea    more so exertion   Dysrhythmia    skipped beats every now and then   GERD (gastroesophageal reflux disease)    Glaucoma    both eyes right eye worse than left   Hammer toes of both feet    Headache    migraines with aura   Heel spur    both heels and front   HSV (herpes simplex virus) infection    Hyperglycemia    denies   Hyperlipidemia    Hypertension    IBS (irritable bowel syndrome)    IC (interstitial cystitis)    Insomnia disorder related to known organic factor    Osteoporosis    PONV (postoperative nausea and vomiting)    has had more surgeries, and had no problems   Stroke [facility]) 2016   Vitamin D deficiency    Past Surgical History:  Procedure Laterality Date   ABDOMINAL HYSTERECTOMY     1 ovary and womb removed   CARDIAC CATHETERIZATION  7/12   non obst dz -- not sure what yr   CHOLECYSTECTOMY     EUS N/A 02/03/2017   Procedure: UPPER ENDOSCOPIC ULTRASOUND (EUS) LINEAR;  Surgeon: Rachael Fee, MD;  Location: [facility];  Service: Endoscopy;  Laterality: N/A;   EYE SURGERY Bilateral    ioc for cataracts    LAPAROSCOPIC RIGHT COLECTOMY Right 11/10/2016   Procedure: LAPAROSCOPIC EXTENDED RIGHT COLECTOMY;  Surgeon: Almond Lint, MD;  Location: [facility] OR;  Service: General;  Laterality: Right;   PORT-A-CATH REMOVAL Left 11/21/2019   Procedure: REMOVAL PORT-A-CATH;  Surgeon: Almond Lint, MD;   Location: [facility];  Service: General;  Laterality: Left;   PORTACATH PLACEMENT Left 12/22/2016   Procedure: INSERTION PORT-A-CATH;  Surgeon: Almond Lint, MD;  Location: [facility];  Service: General;  Laterality: Left;   ROTATOR CUFF REPAIR Bilateral    Social History   Tobacco Use   Smoking status: Never   Smokeless tobacco: Never  Vaping Use   Vaping status: Never Used  Substance Use Topics   Alcohol use: No    Alcohol/week: 0.0 standard drinks of alcohol   Drug use: No   Family History  Problem Relation Age of Onset   Alcohol abuse Mother    Heart attack Mother    GER disease Mother    GER disease Father    Alcohol abuse Father    Stroke Father    Tuberculosis Father    Heart attack Father    Aneurysm Father    Mental illness Sister    Depression Sister    Breast cancer Neg Hx    Allergies  Allergen Reactions   Salmon [Fish Allergy] Anaphylaxis    This happens with "CANNED SALMON" only, if eaten uncooked   Ampicillin Nausea Only    Has patient had a PCN reaction causing immediate rash, facial/tongue/throat swelling, SOB or lightheadedness with hypotension: No Has patient had a PCN reaction causing severe rash involving mucus membranes or skin necrosis: No Has patient had a PCN reaction that required hospitalization: No Has patient had a PCN reaction occurring within the last 10 years: No If all of the above answers are "NO", then may proceed with Cephalosporin use.    Codeine Nausea And Vomiting   Flagyl [Metronidazole] Nausea And Vomiting    Patient was taking this along with Neomycin and could not differentiate which med triggered the nausea & vomiting   Meperidine Hcl Nausea And Vomiting   Neomycin Nausea And Vomiting    Patient was taking this along with Flagyl and could not differentiate which med triggered the nausea & vomiting   Propoxyphene Hcl Nausea And Vomiting   Sulfa Antibiotics Hives   Tramadol Hcl Nausea And  Vomiting   Current Outpatient Medications on File Prior to Visit  Medication Sig Dispense Refill   brimonidine-timolol (COMBIGAN) 0.2-0.5 % ophthalmic solution Apply 1 drop to eye 2 (two) times daily.     Calcium Carb-Cholecalciferol (CALCIUM 600 + D PO) Take 1 capsule by mouth in the morning and at bedtime.     LUMIGAN 0.01 % SOLN Place 1 drop into both eyes at bedtime.     Multiple Vitamin (MULTIVITAMIN WITH MINERALS) TABS tablet Take 1 tablet by mouth daily.      POTASSIUM PO Take 1 capsule by mouth daily.     rosuvastatin (CRESTOR) 20 MG tablet TAKE 1 TABLET BY MOUTH EVERY DAY 90 tablet 1   solifenacin (VESICARE) 5 MG tablet TAKE 1 TABLET (5 MG TOTAL) BY MOUTH DAILY. (Patient not taking: Reported on 03/16/2023) 90 tablet 0   No current facility-administered medications on file prior to visit.    Review of Systems  Constitutional:  Negative for activity change, appetite change, fatigue, fever and unexpected weight change.  HENT:  Negative for congestion, ear pain, rhinorrhea, sinus pressure and sore throat.   Eyes:  Positive for visual disturbance. Negative for pain and redness.  Respiratory:  Negative for cough, shortness of breath and wheezing.   Cardiovascular:  Negative for chest pain and palpitations.  Gastrointestinal:  Negative for abdominal pain, blood in stool, constipation and diarrhea.  Endocrine: Negative for polydipsia and polyuria.  Genitourinary:  Positive for frequency, pelvic pain and urgency. Negative for decreased urine volume, dysuria, flank pain, vaginal discharge and vaginal pain.  Musculoskeletal:  Negative  for arthralgias, back pain and myalgias.  Skin:  Negative for pallor and rash.  Allergic/Immunologic: Negative for environmental allergies.  Neurological:  Negative for dizziness, syncope and headaches.  Hematological:  Negative for adenopathy. Does not bruise/bleed easily.  Psychiatric/Behavioral:  Negative for decreased concentration and dysphoric mood. The  patient is not nervous/anxious.        Objective:   Physical Exam Constitutional:      General: She is not in acute distress.    Appearance: Normal appearance. She is well-developed and normal weight. She is not ill-appearing or diaphoretic.  HENT:     Head: Normocephalic and atraumatic.     Mouth/Throat:     Mouth: Mucous membranes are moist.  Eyes:     General: No scleral icterus.       Right eye: No discharge.        Left eye: No discharge.     Conjunctiva/sclera: Conjunctivae normal.     Pupils: Pupils are equal, round, and reactive to light.  Cardiovascular:     Rate and Rhythm: Normal rate and regular rhythm.     Heart sounds: Normal heart sounds.  Pulmonary:     Effort: Pulmonary effort is normal. No respiratory distress.     Breath sounds: Normal breath sounds. No wheezing or rales.  Abdominal:     General: Bowel sounds are normal. There is no distension.     Palpations: Abdomen is soft. There is no mass.     Tenderness: There is abdominal tenderness. There is no right CVA tenderness, left CVA tenderness or rebound.     Comments: No cva tenderness  Mild suprapubic tenderness Bladder does not feel distended   Musculoskeletal:     Cervical back: Normal range of motion and neck supple.     Right lower leg: No edema.     Left lower leg: No edema.  Lymphadenopathy:     Cervical: No cervical adenopathy.  Skin:    Coloration: Skin is not pale.     Findings: No bruising, erythema or rash.  Neurological:     Mental Status: She is alert.     Cranial Nerves: No cranial nerve deficit.     Motor: No weakness.  Psychiatric:     Comments: Seemed a bit confused /cognitively slowed early in visit  After drinking some apple juice and water this improved- to baseline             Assessment & Plan:   Problem List Items Addressed This Visit       Digestive   Cancer of overlapping sites of colon [facility]) (Chronic)   Per last onc note 08/2021  Her last CT CAP from  06/11/2020 showed stable exam with no new or progressive findings to suggest recurrent or metastatic disease.  -She is clinically doing well, exam was unremarkable.  Lab reviewed.  No clinical concern for recurrence -She now has completed 5 years of cancer surveillance, I will see her as needed.  Liver lesions got smaller after chemo   Colonoscopy would be due next in 2028 if she is healthy enough-her choice      Relevant Medications   cephALEXin (KEFLEX) 500 MG capsule   valACYclovir (VALTREX) 500 MG tablet     Genitourinary   INTERSTITIAL CYSTITIS   No dysuria today but does have some bladder discomfort  Urine culture pending Encouraged her to stay well hydrated  Urine is concentrated  Cannot r/o uti -some bacteria on micro  RESOLVED: Acute cystitis without hematuria - Primary   Relevant Orders   Urine Culture     Other   Urinary frequency   Acute on chronic Some bacteria and wbc on urine micro Will treat with keflex pending culture       Relevant Orders   POCT Urinalysis Dipstick (Automated) (Completed)   POCT UA - Microscopic Only (Completed)   Lower abdominal pain   Urinalysis has some bacterial and wbc on micro Sent for culture  Will treat with keflex for 3 days in meantime Reassuring exam  Call back and Er precautions noted in detail today        Relevant Orders   POCT UA - Microscopic Only (Completed)   Herpes simplex   Pt needed prescription for valtrex for prn use  Does not need often No current symptoms  Valtrex 500 mg bid for 3 d prn flares sent to her pharmacy      Relevant Medications   cephALEXin (KEFLEX) 500 MG capsule   valACYclovir (VALTREX) 500 MG tablet   Glaucoma   Sees Dr Dione Booze  Has appointment upcoming Notes some vision change , however also lost her glasses Encouraged her to talk to him about getting new vision prescription and glasses asap  Also encouraged to discuss driving safety based on vision Pt will try and get someone  to drive her (she dislikes driving anyway) until then  That appointment is upcoming soon per pt   Did offer SW consult to help with transportation needs Pt declined       Relevant Medications   brimonidine-timolol (COMBIGAN) 0.2-0.5 % ophthalmic solution

## 2023-03-16 NOTE — Assessment & Plan Note (Signed)
No dysuria today but does have some bladder discomfort  Urine culture pending Encouraged her to stay well hydrated  Urine is concentrated  Cannot r/o uti -some bacteria on micro

## 2023-03-16 NOTE — Assessment & Plan Note (Signed)
Pt needed prescription for valtrex for prn use  Does not need often No current symptoms  Valtrex 500 mg bid for 3 d prn flares sent to her pharmacy

## 2023-03-16 NOTE — Patient Instructions (Addendum)
Aim for 64 oz of water daily if you can   Make sure you get your new glasses   Based on your urinalysis you may be a little dehydrated  Keep working on fluid intake   You may have a uti but we need to see if the urine culture verifies this  While we wait for the culture result , take 3 days of generic keflex   Please let us know in the meantime if you do not improve     In the future  For a herpes outbreak -take valtrex 500 mg twice daily for 3 days

## 2023-03-16 NOTE — Assessment & Plan Note (Signed)
Urinalysis has some bacterial and wbc on micro Sent for culture  Will treat with keflex for 3 days in meantime Reassuring exam  Call back and Er precautions noted in detail today

## 2023-03-16 NOTE — Assessment & Plan Note (Signed)
Per last onc note 08/2021  Her last CT CAP from 06/11/2020 showed stable exam with no new or progressive findings to suggest recurrent or metastatic disease.  -She is clinically doing well, exam was unremarkable.  Lab reviewed.  No clinical concern for recurrence -She now has completed 5 years of cancer surveillance, I will see her as needed.  Liver lesions got smaller after chemo   Colonoscopy would be due next in 2028 if she is healthy enough-her choice

## 2023-03-16 NOTE — Assessment & Plan Note (Addendum)
Sees Dr Dione Booze  Has appointment upcoming Notes some vision change , however also lost her glasses Encouraged her to talk to him about getting new vision prescription and glasses asap  Also encouraged to discuss driving safety based on vision Pt will try and get someone to drive her (she dislikes driving anyway) until then  That appointment is upcoming soon per pt   Did offer SW consult to help with transportation needs Pt declined

## 2023-03-16 NOTE — Assessment & Plan Note (Addendum)
Pt has increased frequency/urgency and bladder pain in setting of known IC  No dysuria  Took azo  Some bacteria and wbc on urine micro  Urine is concentrated-encouraged more fluid/water intake   Pending culture  Treating with 3d of keflex while waiting for culture   Update if not starting to improve in a week or if worsening  Call back and Er precautions noted in detail today   Handout given

## 2023-03-16 NOTE — Assessment & Plan Note (Signed)
Low abd pain  Also more frequent urination than usual (has history of IC and overactive bladder) Took azo Bact and wbc on urine micro Culture pending  Keflex given for 3 d pending result   Encouraged better hydration-her urine is concentrated   Noted cognitive slowing today but after drinking water and apple juice much improved  Encouraged her to go home and eat

## 2023-03-17 ENCOUNTER — Telehealth: Payer: Self-pay | Admitting: *Deleted

## 2023-03-17 LAB — URINE CULTURE
MICRO NUMBER:: 15984135
Result:: NO GROWTH
SPECIMEN QUALITY:: ADEQUATE

## 2023-03-17 NOTE — Telephone Encounter (Signed)
Still pending culture  Will let her know  Take the antibiotic as prescribed

## 2023-03-17 NOTE — Telephone Encounter (Signed)
Copied from CRM [id number]. Topic: Clinical - Medical Advice >> Mar 17, 2023 12:48 PM Almira Coaster wrote: Reason for CRM: Patient is calling to follow up on the results of her urine test and culture that was preformed yesterday.

## 2023-03-17 NOTE — Telephone Encounter (Signed)
Left VM letting pt know Dr. Marliss Coots comments

## 2023-03-18 ENCOUNTER — Other Ambulatory Visit: Payer: Self-pay | Admitting: Family Medicine

## 2023-03-18 NOTE — Telephone Encounter (Signed)
Copied from CRM [id number]. Topic: Clinical - Medication Refill >> Mar 18, 2023  4:39 PM Sonny Dandy B wrote: Most Recent Primary Care Visit:  Provider: Roxy Manns A  Department: [facility]  Visit Type: SAME DAY  Date: 09/30/2022  Medication: cephALEXin (KEFLEX) 500 MG capsule  Has the patient contacted their pharmacy? Yes (Agent: If no, request that the patient contact the pharmacy for the refill. If patient does not wish to contact the pharmacy document the reason why and proceed with request.) (Agent: If yes, when and what did the pharmacy advise?)  Is this the correct pharmacy for this prescription? Yes If no, delete pharmacy and type the correct one.  This is the patient's preferred pharmacy:  [facility] [phone] [place] - [address] [place] [address] Phone: [phone] Fax: [phone]   Has the prescription been filled recently? Yes  Is the patient out of the medication? Yes  Has the patient been seen for an appointment in the last year OR does the patient have an upcoming appointment? Yes  Can we respond through MyChart? Yes  Agent: Please be advised that Rx refills may take up to 3 business days. We ask that you follow-up with your pharmacy.

## 2023-03-18 NOTE — Telephone Encounter (Signed)
Urine culture is clear-no more antibiotics needed How are her symptoms ?

## 2023-03-21 NOTE — Telephone Encounter (Signed)
Unable to reach patient. Left voicemail to return call to our office.

## 2023-03-22 NOTE — Telephone Encounter (Signed)
Left VM requesting pt to call the office back

## 2023-03-23 DIAGNOSIS — H401133 Primary open-angle glaucoma, bilateral, severe stage: Secondary | ICD-10-CM | POA: Diagnosis not present

## 2023-03-23 DIAGNOSIS — H2013 Chronic iridocyclitis, bilateral: Secondary | ICD-10-CM | POA: Diagnosis not present

## 2023-03-23 DIAGNOSIS — H16223 Keratoconjunctivitis sicca, not specified as Sjogren's, bilateral: Secondary | ICD-10-CM | POA: Diagnosis not present

## 2023-03-23 DIAGNOSIS — Z961 Presence of intraocular lens: Secondary | ICD-10-CM | POA: Diagnosis not present

## 2023-04-12 DIAGNOSIS — H3581 Retinal edema: Secondary | ICD-10-CM | POA: Diagnosis not present

## 2023-04-12 DIAGNOSIS — Z961 Presence of intraocular lens: Secondary | ICD-10-CM | POA: Diagnosis not present

## 2023-04-12 DIAGNOSIS — H30033 Focal chorioretinal inflammation, peripheral, bilateral: Secondary | ICD-10-CM | POA: Diagnosis not present

## 2023-04-12 DIAGNOSIS — H35373 Puckering of macula, bilateral: Secondary | ICD-10-CM | POA: Diagnosis not present

## 2023-04-26 ENCOUNTER — Ambulatory Visit (INDEPENDENT_AMBULATORY_CARE_PROVIDER_SITE_OTHER): Admitting: Family Medicine

## 2023-04-26 ENCOUNTER — Encounter: Payer: Self-pay | Admitting: Family Medicine

## 2023-04-26 VITALS — BP 106/62 | HR 52 | Temp 97.8°F | Ht 63.5 in | Wt 147.5 lb

## 2023-04-26 DIAGNOSIS — H3093 Unspecified chorioretinal inflammation, bilateral: Secondary | ICD-10-CM | POA: Diagnosis not present

## 2023-04-26 DIAGNOSIS — I2510 Atherosclerotic heart disease of native coronary artery without angina pectoris: Secondary | ICD-10-CM

## 2023-04-26 DIAGNOSIS — R001 Bradycardia, unspecified: Secondary | ICD-10-CM | POA: Diagnosis not present

## 2023-04-26 DIAGNOSIS — I491 Atrial premature depolarization: Secondary | ICD-10-CM | POA: Insufficient documentation

## 2023-04-26 DIAGNOSIS — H409 Unspecified glaucoma: Secondary | ICD-10-CM

## 2023-04-26 DIAGNOSIS — I10 Essential (primary) hypertension: Secondary | ICD-10-CM

## 2023-04-26 DIAGNOSIS — R9431 Abnormal electrocardiogram [ECG] [EKG]: Secondary | ICD-10-CM

## 2023-04-26 NOTE — Assessment & Plan Note (Signed)
 Sees Dr Dione Booze Continues comigan oph solution (may dec HR)  Irritates her eyes  She plans to d/w prescriber/ is tolerable  Also sees specialist for chorioretinal inflammation

## 2023-04-26 NOTE — Progress Notes (Signed)
 Subjective:    Patient ID: [name], female    DOB: Feb 10, 1941, 83 y.o.   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 Immune   Per oph progress note 1. Peripheral focal chorioretinal inflammation of both eyes - Patient shows signs of active inflammation on ocular exam today 04/12/23 - I have discussed the vision threatening nature/potentially blinding disease which requires chemotherapy/immune suppression. I have discussed in great detail the medication along with reviewing all the systemic implications.  -I have reviewed that systemic immune suppression requires monitoring with high risk labs as there is always the potential for changes in hepatic and hematologic or renal parameters that can be life threatening.  - I have discussed she has active inflammation on exam today that requires treatment. I have discussed rationale for starting systemic steroid-sparing therapy. I recommend starting Azathioprine. I have discussed side effects and patient consents to proceed on 04/12/23.  Imuran Immunosuppression side effects: Liver failure/ toxicity, neuropathy, mild renal impairment, bone marrow suppression, secondary infections, and lymphoproliferative disorders.  - I will draw high risk labs today 04/12/2023 - Start Azathioprine 50 mg daily today 04/12/23 - Currently on Brim/tim BID OU and lumigan qhs OU  - Follow  up in 4 weeks for reevaluation    Thinks she is having side effect from [place] (brimonidine-timolol)   Thinks the azathioprine is helping vision a bit  Had to hire someone to clean her stalls on this medicine /trying to avoid infection  Still feeds horses twice daily  One episode of GI upset /not bad   Wears glasses as well  Also sun glasses     Had 3 lead strip from ins co last month  It noted atrial premature depolarization   No cp  No shortness of breath  No change in exercise tolerance     History of CAD non occlusive  Also HTN and aortic atherosclerosis  Seen with cardiac ca score years ago  Last echo 2017-do not have results    bp is stable today  No cp or palpitations or headaches or edema  No side effects to medicines  BP Readings from Last 3 Encounters:  04/26/23 106/62  03/16/23 (!) 102/48  09/30/22 110/60    Pulse Readings from Last 3 Encounters:  04/26/23 (!) 52  03/16/23 60  09/30/22 (!) 59   Blood pressure is fine   Lab Results  Component Value Date   NA 139 08/18/2022   K 4.3 08/18/2022   CO2 26 08/18/2022   GLUCOSE 106 (H) 08/18/2022   BUN 24 (H) 08/18/2022   CREATININE 1.12 08/18/2022   CALCIUM 9.9 08/18/2022   GFR 45.29 (L) 08/18/2022   EGFR >60 02/21/2017   GFRNONAA 49 (L) 08/31/2021    Lab Results  Component Value Date  CHOL 172 08/18/2022   HDL 73.70 08/18/2022   LDLCALC 81 08/18/2022   LDLDIRECT 138.9 07/05/2012   TRIG 88.0 08/18/2022   CHOLHDL 2 08/18/2022   Takes crestor 20 mg daily   EKG notes sinus brady 47 No acute changes         Patient Active Problem List   Diagnosis Date Noted   PAC (premature atrial contraction) 04/26/2023   Bradycardia 04/26/2023   Chorioretinal inflammation of both eyes 04/26/2023   Lower abdominal pain 03/16/2023   Glaucoma 03/16/2023   Onychomycosis 06/22/2022   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   Hammer toes of both feet 03/15/2022   Right knee pain  12/28/2021   Acute cystitis 12/28/2021   Decreased GFR 07/21/2021   Palpitations 06/24/2021   History of colon cancer 04/02/2020   Atherosclerosis of aorta (HCC) 04/02/2020   Blood clotting disorder (HCC) 06/30/2018   Fall 10/28/2017   Genetic testing 04/25/2017   History of DVT (deep vein thrombosis) 11/24/2016   Cancer of overlapping sites of colon (HCC) 09/23/2016   Estrogen deficiency 07/30/2016   Screening mammogram, encounter for 07/30/2016   Routine general medical examination at a health care facility 07/27/2016   H/O: CVA (cerebrovascular accident) 05/06/2015   HTN (hypertension) 05/06/2015   HLD (hyperlipidemia) 05/06/2015   GERD (gastroesophageal reflux disease) 05/06/2015   CAD (coronary artery disease) 05/06/2015   Urinary frequency 12/28/2013   Seborrheic keratosis 07/05/2012   Herpes simplex 12/29/2010   Callus of foot 10/16/2010   Coronary artery disease, non-occlusive 10/16/2010   Insomnia 02/02/2010   HAND PAIN, BILATERAL 01/09/2010   Prediabetes 07/29/2009   ALLERGIC RHINITIS 12/04/2008   Vitamin D deficiency 04/30/2008   COLONIC POLYPS 06/28/2007   IBS 03/17/2007   INTERSTITIAL CYSTITIS 03/17/2007   Osteoporosis 03/17/2007   Past Medical History:  Diagnosis Date   Anemia    Arthritis    Astigmatism    both eyes   Cancer (HCC)    colon surgery done last chemo tx 01-17-17   Cataract    Colon cancer (HCC)    Coronary artery disease, non-occlusive    Dyspnea    more so exertion   Dysrhythmia    skipped beats every now and then   GERD (gastroesophageal reflux disease)    Glaucoma    both eyes right eye worse than left   Hammer toes of both feet    Headache    migraines with aura   Heel spur    both heels and front   HSV (herpes simplex virus) infection    Hyperglycemia    denies   Hyperlipidemia    Hypertension    IBS (irritable bowel syndrome)    IC (interstitial cystitis)    Insomnia disorder related to known organic factor     Osteoporosis    PONV (postoperative nausea and vomiting)    has had more surgeries, and had no problems   Stroke (HCC) 2016   Vitamin D deficiency    Past Surgical History:  Procedure Laterality Date   ABDOMINAL HYSTERECTOMY     1 ovary and womb removed   CARDIAC CATHETERIZATION  7/12   non obst dz -- not sure what yr   CHOLECYSTECTOMY     EUS N/A 02/03/2017   Procedure: UPPER ENDOSCOPIC ULTRASOUND (EUS) LINEAR;  Surgeon: Rachael Fee, MD;  Location: [facility];  Service: Endoscopy;  Laterality: N/A;   EYE SURGERY Bilateral    ioc for cataracts    LAPAROSCOPIC RIGHT  COLECTOMY Right 11/10/2016   Procedure: LAPAROSCOPIC EXTENDED RIGHT COLECTOMY;  Surgeon: Almond Lint, MD;  Location: [facility];  Service: General;  Laterality: Right;   PORT-A-CATH REMOVAL Left 11/21/2019   Procedure: REMOVAL PORT-A-CATH;  Surgeon: Almond Lint, MD;  Location: [facility];  Service: General;  Laterality: Left;   PORTACATH PLACEMENT Left 12/22/2016   Procedure: INSERTION PORT-A-CATH;  Surgeon: Almond Lint, MD;  Location: [facility];  Service: General;  Laterality: Left;   ROTATOR CUFF REPAIR Bilateral    Social History   Tobacco Use   Smoking status: Never   Smokeless tobacco: Never  Vaping Use   Vaping status: Never Used  Substance Use Topics   Alcohol use: No    Alcohol/week: 0.0 standard drinks of alcohol   Drug use: No   Family History  Problem Relation Age of Onset   Alcohol abuse Mother    Heart attack Mother    GER disease Mother    GER disease Father    Alcohol abuse Father    Stroke Father    Tuberculosis Father    Heart attack Father    Aneurysm Father    Mental illness Sister    Depression Sister    Breast cancer Neg Hx    Allergies  Allergen Reactions   Salmon [Fish Allergy] Anaphylaxis    This happens with "CANNED SALMON" only, if eaten uncooked   Ampicillin Nausea Only    Has patient had a PCN reaction causing immediate rash,  facial/tongue/throat swelling, SOB or lightheadedness with hypotension: No Has patient had a PCN reaction causing severe rash involving mucus membranes or skin necrosis: No Has patient had a PCN reaction that required hospitalization: No Has patient had a PCN reaction occurring within the last 10 years: No If all of the above answers are "NO", then may proceed with Cephalosporin use.    Codeine Nausea And Vomiting   Flagyl [Metronidazole] Nausea And Vomiting    Patient was taking this along with Neomycin and could not differentiate which med triggered the nausea & vomiting   Meperidine Hcl Nausea And Vomiting   Neomycin Nausea And Vomiting    Patient was taking this along with Flagyl and could not differentiate which med triggered the nausea & vomiting   Propoxyphene Hcl Nausea And Vomiting   Sulfa Antibiotics Hives   Tramadol Hcl Nausea And Vomiting   Current Outpatient Medications on File Prior to Visit  Medication Sig Dispense Refill   azaTHIOprine (IMURAN) 50 MG tablet Take 1 tablet by mouth daily.     brimonidine-timolol (COMBIGAN) 0.2-0.5 % ophthalmic solution Apply 1 drop to eye 2 (two) times daily.     Calcium Carb-Cholecalciferol (CALCIUM 600 + D PO) Take 1 capsule by mouth in the morning and at bedtime.     LUMIGAN 0.01 % SOLN Place 1 drop into both eyes at bedtime.     Multiple Vitamin (MULTIVITAMIN WITH MINERALS) TABS tablet Take 1 tablet by mouth daily.      POTASSIUM PO Take 1 capsule by mouth daily.     rosuvastatin (CRESTOR) 20 MG tablet TAKE 1 TABLET BY MOUTH EVERY DAY 90 tablet 1   solifenacin (VESICARE) 5 MG tablet TAKE 1 TABLET (5 MG TOTAL) BY MOUTH DAILY. 90 tablet 0   valACYclovir (VALTREX) 500 MG tablet Take 1 tablet (500 mg total) by mouth 2 (two) times daily. For a flare of herpes 6 tablet 1   No current facility-administered medications on file prior to visit.  Review of Systems  Constitutional:  Negative for activity change, appetite change, fatigue, fever  and unexpected weight change.  HENT:  Negative for congestion, ear pain, rhinorrhea, sinus pressure and sore throat.   Eyes:  Positive for discharge, redness and visual disturbance. Negative for pain.  Respiratory:  Negative for cough, shortness of breath and wheezing.   Cardiovascular:  Negative for chest pain, palpitations and leg swelling.  Gastrointestinal:  Negative for abdominal pain, blood in stool, constipation and diarrhea.  Endocrine: Negative for polydipsia and polyuria.  Genitourinary:  Negative for dysuria, frequency and urgency.  Musculoskeletal:  Negative for arthralgias, back pain and myalgias.  Skin:  Negative for pallor and rash.  Allergic/Immunologic: Negative for environmental allergies.  Neurological:  Negative for dizziness, syncope and headaches.  Hematological:  Negative for adenopathy. Does not bruise/bleed easily.  Psychiatric/Behavioral:  Negative for decreased concentration and dysphoric mood. The patient is not nervous/anxious.        Objective:   Physical Exam Constitutional:      General: She is not in acute distress.    Appearance: Normal appearance. She is well-developed and normal weight. She is not ill-appearing or diaphoretic.  HENT:     Head: Normocephalic and atraumatic.     Mouth/Throat:     Mouth: Mucous membranes are moist.  Eyes:     General: No scleral icterus.    Extraocular Movements: Extraocular movements intact.     Pupils: Pupils are equal, round, and reactive to light.     Comments: Mild bilat conj injection  Slight tearing   Neck:     Thyroid: No thyromegaly.     Vascular: No carotid bruit or JVD.  Cardiovascular:     Rate and Rhythm: Regular rhythm. Bradycardia present.     Heart sounds: Normal heart sounds.     No gallop.  Pulmonary:     Effort: Pulmonary effort is normal. No respiratory distress.     Breath sounds: Normal breath sounds. No stridor. No wheezing, rhonchi or rales.  Abdominal:     General: There is no  distension or abdominal bruit.     Palpations: Abdomen is soft.  Musculoskeletal:     Cervical back: Normal range of motion and neck supple.     Right lower leg: No edema.     Left lower leg: No edema.  Lymphadenopathy:     Cervical: No cervical adenopathy.  Skin:    General: Skin is warm and dry.     Coloration: Skin is not pale.     Findings: No rash.  Neurological:     Mental Status: She is alert.     Cranial Nerves: No cranial nerve deficit.     Coordination: Coordination normal.     Deep Tendon Reflexes: Reflexes are normal and symmetric. Reflexes normal.  Psychiatric:        Mood and Affect: Mood normal.           Assessment & Plan:   Problem List Items Addressed This Visit       Cardiovascular and Mediastinum   PAC (premature atrial contraction)   Noted on ins EKG/physical  Not apparent today  No palpitations or exercise intolerance       HTN (hypertension)   BP: 106/62  Still fine without medicine  No symptoms  Very active   Has not been a problem recently       Coronary artery disease, non-occlusive   No angina  Noted on Ct years ago  Pt has  not had cardiology follow up and would like to  Blood pressure stable-no meds  On statin           Other   Glaucoma   Sees Dr Dione Booze Continues comigan oph solution (may dec HR)  Irritates her eyes  She plans to d/w prescriber/ is tolerable  Also sees specialist for chorioretinal inflammation       Chorioretinal inflammation of both eyes   Sees Dr Sherryll Burger at [facility]  Is being treated with azathioprine 50 mg  Being careful to protect self from infection   Also has glaucoma   Carefully watched  Thinks vision has improved a little   Encouraged continued follow up with oph       Bradycardia   Baseline pulse in 50s On timolol oph- pulse is 47 today  Occational light headed/has to get up slowly  No palpitations or other symptoms  Beta blocker eye drop for glaucoma may add to this   Blood  pressure 102/48   Referred to cardiology to check in and also for history of CAD non obst         Other Visit Diagnoses       Abnormal EKG    -  Primary   Relevant Orders   EKG 12-Lead (Completed)

## 2023-04-26 NOTE — Assessment & Plan Note (Signed)
 Sees Dr Sherryll Burger at [facility]  Is being treated with azathioprine 50 mg  Being careful to protect self from infection   Also has glaucoma   Carefully watched  Thinks vision has improved a little   Encouraged continued follow up with oph

## 2023-04-26 NOTE — Assessment & Plan Note (Signed)
 BP: 106/62  Still fine without medicine  No symptoms  Very active   Has not been a problem recently

## 2023-04-26 NOTE — Assessment & Plan Note (Signed)
 No angina  Noted on Ct years ago  Pt has not had cardiology follow up and would like to  Blood pressure stable-no meds  On statin

## 2023-04-26 NOTE — Assessment & Plan Note (Signed)
 Noted on ins EKG/physical  Not apparent today  No palpitations or exercise intolerance

## 2023-04-26 NOTE — Patient Instructions (Signed)
 EKG is reassuring but pulse is lower than usual  Take your time getting up and watch for light headedness   Drink fluids  Continue the generic crestor   Continue follow up with your eye specialists   I put the referral in for cardiology to get established  Please let us know if you don't hear in 1-2 weeks to set that up

## 2023-04-26 NOTE — Assessment & Plan Note (Addendum)
 Baseline pulse in 50s On timolol oph- pulse is 47 today  Occational light headed/has to get up slowly  No palpitations or other symptoms  Beta blocker eye drop for glaucoma may add to this   Blood pressure 102/48   Referred to cardiology to check in and also for history of CAD non obst

## 2023-05-29 ENCOUNTER — Other Ambulatory Visit: Payer: Self-pay | Admitting: Family Medicine

## 2023-05-31 DIAGNOSIS — H30033 Focal chorioretinal inflammation, peripheral, bilateral: Secondary | ICD-10-CM | POA: Diagnosis not present

## 2023-05-31 DIAGNOSIS — H35373 Puckering of macula, bilateral: Secondary | ICD-10-CM | POA: Diagnosis not present

## 2023-05-31 DIAGNOSIS — Z961 Presence of intraocular lens: Secondary | ICD-10-CM | POA: Diagnosis not present

## 2023-05-31 DIAGNOSIS — H3581 Retinal edema: Secondary | ICD-10-CM | POA: Diagnosis not present

## 2023-06-06 DIAGNOSIS — H401133 Primary open-angle glaucoma, bilateral, severe stage: Secondary | ICD-10-CM | POA: Diagnosis not present

## 2023-06-06 DIAGNOSIS — Z961 Presence of intraocular lens: Secondary | ICD-10-CM | POA: Diagnosis not present

## 2023-06-06 DIAGNOSIS — H2013 Chronic iridocyclitis, bilateral: Secondary | ICD-10-CM | POA: Diagnosis not present

## 2023-07-13 ENCOUNTER — Ambulatory Visit (INDEPENDENT_AMBULATORY_CARE_PROVIDER_SITE_OTHER): Admitting: Family Medicine

## 2023-07-13 ENCOUNTER — Encounter: Payer: Self-pay | Admitting: Family Medicine

## 2023-07-13 VITALS — BP 122/64 | HR 55 | Temp 98.0°F | Ht 63.5 in | Wt 145.1 lb

## 2023-07-13 DIAGNOSIS — W57XXXA Bitten or stung by nonvenomous insect and other nonvenomous arthropods, initial encounter: Secondary | ICD-10-CM | POA: Diagnosis not present

## 2023-07-13 DIAGNOSIS — Z5982 Transportation insecurity: Secondary | ICD-10-CM | POA: Insufficient documentation

## 2023-07-13 DIAGNOSIS — S30860A Insect bite (nonvenomous) of lower back and pelvis, initial encounter: Secondary | ICD-10-CM | POA: Insufficient documentation

## 2023-07-13 DIAGNOSIS — H3093 Unspecified chorioretinal inflammation, bilateral: Secondary | ICD-10-CM

## 2023-07-13 MED ORDER — DOXYCYCLINE HYCLATE 100 MG PO TABS
100.0000 mg | ORAL_TABLET | Freq: Two times a day (BID) | ORAL | 0 refills | Status: DC
Start: 2023-07-13 — End: 2023-07-28

## 2023-07-13 NOTE — Assessment & Plan Note (Signed)
 Vision impaired  This and glaucoma  On cell cept   Some driving difficulty  Ref to care management for help with ride options

## 2023-07-13 NOTE — Assessment & Plan Note (Signed)
 Due to vision  Has no one to take her to appts   Ref to care management to reach out and help arranging ride services for medical appts  Encouraged pt to answer phone for their call / she agreed

## 2023-07-13 NOTE — Progress Notes (Signed)
 Subjective:    Patient ID: Yvonne  Orien [name], female    DOB: 06-23-1940, 83 y.o.   MRN: [id number]  HPI  Wt Readings from Last 3 Encounters:  07/13/23 145 lb 2 oz (65.8 kg)  04/26/23 147 lb 8 oz (66.9 kg)  03/16/23 149 lb 6 oz (67.8 kg)   25.30 kg/m  Vitals:   07/13/23 1111  BP: 122/64  Pulse: (!) 55  Temp: 98 F (36.7 C)  SpO2: 98%   Pt presents for rash on her back   Has had tick bites- pulled some ticks off her back  Has been around someone with shingles (grand daughter has shingles and was around her)   Some headaches No new fever   Rash is on her back     Vision is worse- having concerns about driving  Chorioretinal inflammation both eyes  Also glaucoma  Changed from azathioprine to cellcept 250 mg bid      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   Lower abdominal pain 03/16/2023   Glaucoma 03/16/2023   Onychomycosis 06/22/2022   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   Hammer toes of both feet 03/15/2022   Right knee pain 12/28/2021   Acute cystitis 12/28/2021   Decreased GFR 07/21/2021   Palpitations 06/24/2021   History of colon cancer 04/02/2020   Atherosclerosis of aorta (HCC) 04/02/2020   Blood clotting disorder (HCC) 06/30/2018   Fall 10/28/2017   Genetic testing 04/25/2017   History of DVT (deep vein thrombosis) 11/24/2016   Cancer of overlapping sites of colon (HCC) 09/23/2016   Estrogen deficiency 07/30/2016   Screening mammogram, encounter for 07/30/2016   Routine general medical examination at a health care facility 07/27/2016   H/O: CVA (cerebrovascular accident) 05/06/2015   HTN (hypertension) 05/06/2015   HLD (hyperlipidemia) 05/06/2015   GERD (gastroesophageal reflux disease) 05/06/2015   CAD (coronary  artery disease) 05/06/2015   Urinary frequency 12/28/2013   Seborrheic keratosis 07/05/2012   Herpes simplex 12/29/2010   Callus of foot 10/16/2010   Coronary artery disease, non-occlusive 10/16/2010   Insomnia 02/02/2010   HAND PAIN, BILATERAL 01/09/2010   Prediabetes 07/29/2009   ALLERGIC RHINITIS 12/04/2008   Vitamin D  deficiency 04/30/2008   COLONIC POLYPS 06/28/2007   IBS 03/17/2007   INTERSTITIAL CYSTITIS 03/17/2007   Osteoporosis 03/17/2007   Past Medical History:  Diagnosis Date   Anemia    Arthritis    Astigmatism    both eyes   Cancer (HCC)    colon surgery done last chemo tx 01-17-17   Cataract    Colon cancer (HCC)    Coronary artery disease, non-occlusive    Dyspnea    more so exertion   Dysrhythmia    skipped beats every now and then   GERD (gastroesophageal reflux disease)    Glaucoma    both eyes right eye worse than left   Hammer toes of both feet    Headache    migraines with aura   Heel spur    both heels and front   HSV (herpes simplex virus) infection    Hyperglycemia    denies   Hyperlipidemia    Hypertension    IBS (irritable bowel syndrome)    IC (interstitial cystitis)    Insomnia disorder related to known organic factor  Osteoporosis    PONV (postoperative nausea and vomiting)    has had more surgeries, and had no problems   Stroke [facility]) 2016   Vitamin D  deficiency    Past Surgical History:  Procedure Laterality Date   ABDOMINAL HYSTERECTOMY     1 ovary and womb removed   CARDIAC CATHETERIZATION  7/12   non obst dz -- not sure what yr   CHOLECYSTECTOMY     EUS N/A 02/03/2017   Procedure: UPPER ENDOSCOPIC ULTRASOUND (EUS) LINEAR;  Surgeon: Janel Medford, MD;  Location: [facility];  Service: Endoscopy;  Laterality: N/A;   EYE SURGERY Bilateral    ioc for cataracts    LAPAROSCOPIC RIGHT COLECTOMY Right 11/10/2016   Procedure: LAPAROSCOPIC EXTENDED RIGHT COLECTOMY;  Surgeon: Lockie Rima, MD;  Location: [facility];  Service:  General;  Laterality: Right;   PORT-A-CATH REMOVAL Left 11/21/2019   Procedure: REMOVAL PORT-A-CATH;  Surgeon: Lockie Rima, MD;  Location: [facility];  Service: General;  Laterality: Left;   PORTACATH PLACEMENT Left 12/22/2016   Procedure: INSERTION PORT-A-CATH;  Surgeon: Lockie Rima, MD;  Location: [facility];  Service: General;  Laterality: Left;   ROTATOR CUFF REPAIR Bilateral    Social History   Tobacco Use   Smoking status: Never   Smokeless tobacco: Never  Vaping Use   Vaping status: Never Used  Substance Use Topics   Alcohol use: No    Alcohol/week: 0.0 standard drinks of alcohol   Drug use: No   Family History  Problem Relation Age of Onset   Alcohol abuse Mother    Heart attack Mother    GER disease Mother    GER disease Father    Alcohol abuse Father    Stroke Father    Tuberculosis Father    Heart attack Father    Aneurysm Father    Mental illness Sister    Depression Sister    Breast cancer Neg Hx    Allergies  Allergen Reactions   Salmon [Fish Allergy] Anaphylaxis    This happens with "CANNED SALMON" only, if eaten uncooked   Ampicillin Nausea Only    Has patient had a PCN reaction causing immediate rash, facial/tongue/throat swelling, SOB or lightheadedness with hypotension: No Has patient had a PCN reaction causing severe rash involving mucus membranes or skin necrosis: No Has patient had a PCN reaction that required hospitalization: No Has patient had a PCN reaction occurring within the last 10 years: No If all of the above answers are "NO", then may proceed with Cephalosporin use.    Codeine Nausea And Vomiting   Flagyl [Metronidazole] Nausea And Vomiting    Patient was taking this along with Neomycin and could not differentiate which med triggered the nausea & vomiting   Meperidine Hcl Nausea And Vomiting   Neomycin Nausea And Vomiting    Patient was taking this along with Flagyl and could not differentiate which med  triggered the nausea & vomiting   Propoxyphene Hcl Nausea And Vomiting   Sulfa Antibiotics Hives   Tramadol  Hcl Nausea And Vomiting   Current Outpatient Medications on File Prior to Visit  Medication Sig Dispense Refill   azaTHIOprine (IMURAN) 50 MG tablet Take 1 tablet by mouth daily.     brimonidine -timolol  (COMBIGAN ) 0.2-0.5 % ophthalmic solution Apply 1 drop to eye 2 (two) times daily.     Calcium  Carb-Cholecalciferol  (CALCIUM  600 + D PO) Take 1 capsule by mouth in the morning and at bedtime.  LUMIGAN 0.01 % SOLN Place 1 drop into both eyes at bedtime.     Multiple Vitamin (MULTIVITAMIN WITH MINERALS) TABS tablet Take 1 tablet by mouth daily.      mycophenolate (CELLCEPT) 250 MG capsule Take 250 mg by mouth 2 (two) times daily.     POTASSIUM PO Take 1 capsule by mouth daily.     rosuvastatin  (CRESTOR ) 20 MG tablet TAKE 1 TABLET BY MOUTH EVERY DAY 90 tablet 1   solifenacin  (VESICARE ) 5 MG tablet TAKE 1 TABLET (5 MG TOTAL) BY MOUTH DAILY. 90 tablet 0   valACYclovir  (VALTREX ) 500 MG tablet Take 1 tablet (500 mg total) by mouth 2 (two) times daily. For a flare of herpes 6 tablet 1   No current facility-administered medications on file prior to visit.    Review of Systems  Constitutional:  Negative for activity change, appetite change, fatigue, fever and unexpected weight change.  HENT:  Negative for congestion, ear pain, rhinorrhea, sinus pressure and sore throat.   Eyes:  Positive for visual disturbance. Negative for pain and redness.  Respiratory:  Negative for cough, shortness of breath and wheezing.   Cardiovascular:  Negative for chest pain and palpitations.  Gastrointestinal:  Negative for abdominal pain, blood in stool, constipation and diarrhea.  Endocrine: Negative for polydipsia and polyuria.  Genitourinary:  Negative for dysuria, frequency and urgency.  Musculoskeletal:  Negative for arthralgias, back pain and myalgias.  Skin:  Negative for pallor and rash.       Tick  bites Itching  Bumps  ? If rash  Allergic/Immunologic: Negative for environmental allergies.  Neurological:  Negative for dizziness, syncope and headaches.       Occasional headache Not today  Hematological:  Negative for adenopathy. Does not bruise/bleed easily.  Psychiatric/Behavioral:  Negative for decreased concentration and dysphoric mood. The patient is not nervous/anxious.        Objective:   Physical Exam Constitutional:      General: She is not in acute distress.    Appearance: Normal appearance. She is well-developed and normal weight. She is not ill-appearing or diaphoretic.  HENT:     Head: Normocephalic and atraumatic.  Eyes:     Conjunctiva/sclera: Conjunctivae normal.     Pupils: Pupils are equal, round, and reactive to light.  Neck:     Thyroid : No thyromegaly.     Vascular: No carotid bruit or JVD.  Cardiovascular:     Rate and Rhythm: Normal rate and regular rhythm.     Heart sounds: Normal heart sounds.     No gallop.  Pulmonary:     Effort: Pulmonary effort is normal. No respiratory distress.     Breath sounds: Normal breath sounds. No wheezing or rales.  Abdominal:     General: There is no distension or abdominal bruit.     Palpations: Abdomen is soft.  Musculoskeletal:     Cervical back: Normal range of motion and neck supple.     Right lower leg: No edema.     Left lower leg: No edema.     Comments: No acute joint changes   Lymphadenopathy:     Cervical: No cervical adenopathy.  Skin:    General: Skin is warm and dry.     Coloration: Skin is not pale.     Findings: No rash.     Comments: Multiple pink papules on back and right flank area consistent with insect bites Largest on mid low back 1 cm  No rings or bullseye  appearance No rash notable on other areas or palms  Tick is attached on left upper back (minimally engorged / moderate size with white spot)  After consent obtained - cleaned with alcohol and removed with tweezers in its entirety   Pt tolerated well Dressed with antibiotic ointment and bandaid   Neurological:     Mental Status: She is alert.     Coordination: Coordination normal.     Deep Tendon Reflexes: Reflexes are normal and symmetric. Reflexes normal.  Psychiatric:        Mood and Affect: Mood normal.           Assessment & Plan:   Problem List Items Addressed This Visit       Musculoskeletal and Integument   Tick bite of back   Multiple bite areas on back and right flank area  (pink papules with few excoriations)  One on low mid back is about 1 cm in diameter /others are smaller  Tick attached to left upper back - mildly engorged if any (resembles lone star tick) - removed with tweezers in its entirety   No rash noted, and no bullseye appearance Some malaise/unsure if new  Is immunocomp with cellcept currently  Treated with doxycycline  bid 7 d for tick bourne illness  Low dose antihistamine prn itch (5 mg of zyrtec over the counter prn )- aware pt has glaucoma   Instructed to watch for rash/fever or other symptoms  Update if not starting to improve in a week or if worsening  Call back and Er precautions noted in detail today    Instructed to use insect repellent when working outdoors on clothing and skin from now on         Other   Transportation insecurity   Due to vision  Has no one to take her to appts   Ref to care management to reach out and help arranging ride services for medical appts  Encouraged pt to answer phone for their call / she agreed      Relevant Orders   AMB Referral [facility] Care Management   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

## 2023-07-13 NOTE — Patient Instructions (Addendum)
 I placed a referral for care management to reach out to you regarding help with transportation  You will get get a call / it may be an unrecognized number so please pick up  Also make sure your voice mail (answering service)  is not full so they can leave a message if needed   If you don't get a call in the next week let me know   For itching get zyrtec over the counter 5 mg (half of a 10 mg) - take every day at bedtime as needed for itching  Is over the counter  Do not scratch or use back scratcher   Start doxycycline  for the tick bites  We removed a tick today  Keep the red spots clean with soap and water   If bites or rash worsen- let us  know   Update if not starting to improve in a week or if worsening   If fever or other new symptoms let us  know

## 2023-07-13 NOTE — Assessment & Plan Note (Addendum)
 Multiple bite areas on back and right flank area  (pink papules with few excoriations)  One on low mid back is about 1 cm in diameter /others are smaller  Tick attached to left upper back - mildly engorged if any (resembles lone star tick) - removed with tweezers in its entirety   No rash noted, and no bullseye appearance Some malaise/unsure if new  Is immunocomp with cellcept currently  Treated with doxycycline  bid 7 d for tick bourne illness  Low dose antihistamine prn itch (5 mg of zyrtec over the counter prn )- aware pt has glaucoma   Instructed to watch for rash/fever or other symptoms  Update if not starting to improve in a week or if worsening  Call back and Er precautions noted in detail today    Instructed to use insect repellent when working outdoors on clothing and skin from now on

## 2023-07-15 ENCOUNTER — Telehealth: Payer: Self-pay

## 2023-07-15 NOTE — Progress Notes (Signed)
   Telephone encounter was:  Unsuccessful.  07/15/2023 Name: Salwa Bai MRN: [id number] DOB: 08/02/1940  Unsuccessful outbound call made today to assist with:  Transportation Needs   Outreach Attempt:  1st Attempt    Azell Leopard [facility]  [facility] Guide, Phone: [phone] Fax: [phone] Website: [email or web address]

## 2023-07-19 ENCOUNTER — Telehealth: Payer: Self-pay

## 2023-07-19 NOTE — Progress Notes (Signed)
   Telephone encounter was:  Unsuccessful.  07/19/2023 Name: [name] MRN: [id number] DOB: 03/04/1940  Unsuccessful outbound call made today to assist with:  Transportation Needs   Outreach Attempt:  2nd Attempt  Unable to leave a message    Azell Leopard [facility]  [facility] Guide, Phone: [phone] Fax: [phone] Website: [email or web address]

## 2023-07-21 ENCOUNTER — Telehealth: Payer: Self-pay

## 2023-07-21 NOTE — Progress Notes (Signed)
   Telephone encounter was:  Unsuccessful.  07/21/2023 Name: [name] MRN: [id number] DOB: [birth year]  Unsuccessful outbound call made today to assist with:  Transportation Needs   Outreach Attempt:  3rd Attempt.  Referral closed unable to contact patient.  No answer, no voicemail   Azell Leopard [facility] Guide, Phone: [phone] Fax: [phone] Website: [email or web address]

## 2023-07-28 ENCOUNTER — Encounter: Payer: Self-pay | Admitting: General Practice

## 2023-07-28 ENCOUNTER — Ambulatory Visit (INDEPENDENT_AMBULATORY_CARE_PROVIDER_SITE_OTHER): Admitting: General Practice

## 2023-07-28 VITALS — BP 118/58 | HR 65 | Temp 97.6°F | Ht 63.5 in | Wt 143.0 lb

## 2023-07-28 DIAGNOSIS — M25561 Pain in right knee: Secondary | ICD-10-CM | POA: Diagnosis not present

## 2023-07-28 NOTE — Assessment & Plan Note (Addendum)
 Acute on chronic.  Mild crepitus on exam.  Full ROM, no swelling on exam.  Discussed to continue using ice, elevation.  Ok to use tylenol  or low dose ibuprofen  as needed.   F/u with PCP if symptoms worsen or do not improve.  ER precautions given. Patient and her daughter verbalize understanding.

## 2023-07-28 NOTE — Progress Notes (Signed)
 Established Patient Office Visit  Subjective   Patient ID: [name]  [name], female    DOB: [birth year]  Age: 83 y.o. MRN: [id number]  Chief Complaint  Patient presents with   Leg Pain    Patient had leg pain x half an hour about 2 days ago.    Leg Pain    Yvonne  [name] is a 83 year old female, patient of Dr. Malissa Se, with past medical history of CAD, HTN, GERD, IBS, chorioretinal inflammation of both eyes, presents today for an acute visit.   Her daughter, [name], is also knee pain.   Right knee pain: symptom onset was two days ago. Lasted for half hour.   She used ice on her knee and elevated her leg which helped her symptoms a lot.  Per daughter, patient did take a low-dose aspirin  as well.   No erythema, warmth to touch or swelling.  She does have some itching down the side of her leg today.  Pain is better and itching is better.    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   Lower abdominal pain 03/16/2023   Glaucoma 03/16/2023   Onychomycosis 06/22/2022   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   Hammer toes of both feet 03/15/2022   Right knee pain 12/28/2021   Acute cystitis 12/28/2021   Decreased GFR 07/21/2021   Palpitations 06/24/2021   History of colon cancer 04/02/2020   Atherosclerosis of aorta (HCC) 04/02/2020   Blood clotting disorder (HCC) 06/30/2018   Fall 10/28/2017   Genetic testing 04/25/2017   History of DVT (deep vein thrombosis) 11/24/2016   Cancer of overlapping sites of colon (HCC) 09/23/2016   Estrogen deficiency 07/30/2016   Screening mammogram, encounter for 07/30/2016   Routine general medical examination at a health care facility 07/27/2016   H/O: CVA (cerebrovascular accident) 05/06/2015   HTN (hypertension)  05/06/2015   HLD (hyperlipidemia) 05/06/2015   GERD (gastroesophageal reflux disease) 05/06/2015   CAD (coronary artery disease) 05/06/2015   Urinary frequency 12/28/2013   Seborrheic keratosis 07/05/2012   Herpes simplex 12/29/2010   Callus of foot 10/16/2010   Coronary artery disease, non-occlusive 10/16/2010   Insomnia 02/02/2010   HAND PAIN, BILATERAL 01/09/2010   Prediabetes 07/29/2009   ALLERGIC RHINITIS 12/04/2008   Vitamin D  deficiency 04/30/2008   COLONIC POLYPS 06/28/2007   IBS 03/17/2007   INTERSTITIAL CYSTITIS 03/17/2007   Osteoporosis 03/17/2007   Past Medical History:  Diagnosis Date   Anemia    Arthritis    Astigmatism    both eyes   Cancer (HCC)    colon surgery done last chemo tx 01-17-17   Cataract    Colon cancer (HCC)    Coronary artery disease, non-occlusive    Dyspnea    more so exertion   Dysrhythmia    skipped beats every now and then   GERD (gastroesophageal reflux disease)    Glaucoma    both eyes right eye worse than left   Hammer toes of both feet    Headache    migraines with aura   Heel spur    both heels and front   HSV (herpes simplex virus) infection    Hyperglycemia    denies   Hyperlipidemia    Hypertension    IBS (irritable bowel syndrome)  IC (interstitial cystitis)    Insomnia disorder related to known organic factor    Osteoporosis    PONV (postoperative nausea and vomiting)    has had more surgeries, and had no problems   Stroke [facility]) 2016   Vitamin D  deficiency    Past Surgical History:  Procedure Laterality Date   ABDOMINAL HYSTERECTOMY     1 ovary and womb removed   CARDIAC CATHETERIZATION  7/12   non obst dz -- not sure what yr   CHOLECYSTECTOMY     EUS N/A 02/03/2017   Procedure: UPPER ENDOSCOPIC ULTRASOUND (EUS) LINEAR;  Surgeon: Janel Medford, MD;  Location: [facility];  Service: Endoscopy;  Laterality: N/A;   EYE SURGERY Bilateral    ioc for cataracts    LAPAROSCOPIC RIGHT COLECTOMY Right  11/10/2016   Procedure: LAPAROSCOPIC EXTENDED RIGHT COLECTOMY;  Surgeon: Lockie Rima, MD;  Location: [facility];  Service: General;  Laterality: Right;   PORT-A-CATH REMOVAL Left 11/21/2019   Procedure: REMOVAL PORT-A-CATH;  Surgeon: Lockie Rima, MD;  Location: [facility];  Service: General;  Laterality: Left;   PORTACATH PLACEMENT Left 12/22/2016   Procedure: INSERTION PORT-A-CATH;  Surgeon: Lockie Rima, MD;  Location: [facility];  Service: General;  Laterality: Left;   ROTATOR CUFF REPAIR Bilateral    Allergies  Allergen Reactions   Salmon [Fish Allergy] Anaphylaxis    This happens with "CANNED SALMON" only, if eaten uncooked   Ampicillin Nausea Only    Has patient had a PCN reaction causing immediate rash, facial/tongue/throat swelling, SOB or lightheadedness with hypotension: No Has patient had a PCN reaction causing severe rash involving mucus membranes or skin necrosis: No Has patient had a PCN reaction that required hospitalization: No Has patient had a PCN reaction occurring within the last 10 years: No If all of the above answers are "NO", then may proceed with Cephalosporin use.    Codeine Nausea And Vomiting   Flagyl [Metronidazole] Nausea And Vomiting    Patient was taking this along with Neomycin and could not differentiate which med triggered the nausea & vomiting   Meperidine Hcl Nausea And Vomiting   Neomycin Nausea And Vomiting    Patient was taking this along with Flagyl and could not differentiate which med triggered the nausea & vomiting   Propoxyphene Hcl Nausea And Vomiting   Sulfa Antibiotics Hives   Tramadol  Hcl Nausea And Vomiting         07/28/2023   12:26 PM 09/23/2022   12:35 PM 04/13/2022   12:34 PM  Depression screen PHQ 2/9  Decreased Interest 0 0 0  Down, Depressed, Hopeless 0 0 0  PHQ - 2 Score 0 0 0  Altered sleeping 2 2 3   Tired, decreased energy 0 1 2  Change in appetite 0 0 2  Feeling bad or failure about yourself   0 0 0  Trouble concentrating 0 0 1  Moving slowly or fidgety/restless 0 0 0  Suicidal thoughts 0 0 0  PHQ-9 Score 2 3 8   Difficult doing work/chores Somewhat difficult Not difficult at all Not difficult at all       07/28/2023   12:27 PM 09/23/2022   12:37 PM 03/15/2022   11:33 AM  GAD 7 : Generalized Anxiety Score  Nervous, Anxious, on Edge 0 0 0  Control/stop worrying 0 2 0  Worry too much - different things 0 1 0  Trouble relaxing 0 0 0  Restless 0 0 0  Easily  annoyed or irritable 0 0 0  Afraid - awful might happen 0 0 1  Total GAD 7 Score 0 3 1  Anxiety Difficulty Not difficult at all Not difficult at all Not difficult at all      Review of Systems  Constitutional:  Negative for chills and fever.  Respiratory:  Negative for shortness of breath.   Cardiovascular:  Negative for chest pain.  Gastrointestinal:  Negative for abdominal pain, constipation, diarrhea, heartburn, nausea and vomiting.  Genitourinary:  Negative for dysuria, frequency and urgency.  Musculoskeletal:  Positive for joint pain.  Neurological:  Negative for dizziness and headaches.  Endo/Heme/Allergies:  Negative for polydipsia.  Psychiatric/Behavioral:  Negative for depression and suicidal ideas. The patient is not nervous/anxious.       Objective:     BP (!) 118/58 (BP Location: Left Arm, Patient Position: Sitting, Cuff Size: Normal)   Pulse 65   Temp 97.6 F (36.4 C) (Oral)   Ht 5' 3.5" (1.613 m)   Wt 143 lb (64.9 kg)   SpO2 98%   BMI 24.93 kg/m  BP Readings from Last 3 Encounters:  07/28/23 (!) 118/58  07/13/23 122/64  04/26/23 106/62   Wt Readings from Last 3 Encounters:  07/28/23 143 lb (64.9 kg)  07/13/23 145 lb 2 oz (65.8 kg)  04/26/23 147 lb 8 oz (66.9 kg)      Physical Exam Vitals and nursing note reviewed.  Constitutional:      Appearance: Normal appearance.  Cardiovascular:     Rate and Rhythm: Normal rate and regular rhythm.     Pulses: Normal pulses.     Heart sounds:  Normal heart sounds.  Pulmonary:     Effort: Pulmonary effort is normal.     Breath sounds: Normal breath sounds.  Neurological:     Mental Status: She is alert and oriented to person, place, and time.  Psychiatric:        Mood and Affect: Mood normal.        Behavior: Behavior normal.        Thought Content: Thought content normal.        Judgment: Judgment normal.      No results found for any visits on 07/28/23.     The ASCVD Risk score (Arnett DK, et al., 2019) failed to calculate for the following reasons:   The 2019 ASCVD risk score is only valid for ages 64 to 46   Risk score cannot be calculated because patient has a medical history suggesting prior/existing ASCVD    Assessment & Plan:  Acute pain of right knee Assessment & Plan: Acute on chronic.  Mild crepitus on exam.  Full ROM, no swelling on exam.  Discussed to continue using ice, elevation.  Ok to use tylenol  or low dose ibuprofen  as needed.   F/u with PCP if symptoms worsen or do not improve.  ER precautions given. Patient and her daughter verbalize understanding.      Return if symptoms worsen or fail to improve.    Jolanda Nation, NP

## 2023-07-28 NOTE — Patient Instructions (Addendum)
 Continue to monitor the pain and swelling in the knee.   Continue tylenol  or ibuprofen  as needed.  Ok to use ice and elevate your legs.   [facility]  [phone]   It was a pleasure meeting you!

## 2023-08-02 ENCOUNTER — Ambulatory Visit: Attending: Cardiology | Admitting: Cardiology

## 2023-08-02 NOTE — Progress Notes (Deleted)
 Cardiology Office Note:  .   Date:  08/02/2023  ID:  Yvonne  Orien [name], DOB 01/13/1941, MRN [id number] PCP: Yvonne Curt, MD  [facility] Providers Cardiologist:  None { Click to update primary MD,subspecialty MD or APP then REFRESH:1}  History of Present Illness: .   Yvonne  Shatasia [name] is a 83 y.o. female patient with hypertension, hypercholesterolemia, chronic calcification and mild coronary atherosclerosis noted by coronary CT angiogram in 2012 and coronary calcium  score at that time was in the 52nd percentile, normal LVEF by echocardiogram on 05/07/2015 without significant valvular abnormality referred back for reevaluation of CAD as patient was concerned about this.  Discussed the use of AI scribe software for clinical note transcription with the patient, who gave verbal consent to proceed.  History of Present Illness   Labs   Lab Results  Component Value Date   CHOL 172 08/18/2022   HDL 73.70 08/18/2022   LDLCALC 81 08/18/2022   LDLDIRECT 138.9 07/05/2012   TRIG 88.0 08/18/2022   CHOLHDL 2 08/18/2022   Lab Results  Component Value Date   NA 139 08/18/2022   K 4.3 08/18/2022   CO2 26 08/18/2022   GLUCOSE 106 (H) 08/18/2022   BUN 24 (H) 08/18/2022   CREATININE 1.12 08/18/2022   CALCIUM  9.9 08/18/2022   GFR 45.29 (L) 08/18/2022   EGFR >60 02/21/2017   GFRNONAA 49 (L) 08/31/2021      Latest Ref Rng & Units 08/18/2022    9:50 AM 08/31/2021   11:57 AM 07/22/2021   11:15 AM  BMP  Glucose 70 - 99 mg/dL 259  80  72   BUN 6 - 23 mg/dL 24  21  30    Creatinine 0.40 - 1.20 mg/dL 5.63  8.75  6.43   Sodium 135 - 145 mEq/L 139  141  138   Potassium 3.5 - 5.1 mEq/L 4.3  3.9  3.9   Chloride 96 - 112 mEq/L 106  107  102   CO2 19 - 32 mEq/L 26  27  28    Calcium  8.4 - 10.5 mg/dL 9.9  9.5  9.8       Latest Ref Rng & Units 08/18/2022    9:50 AM 08/31/2021   12:00 PM 06/24/2021   12:29 PM  CBC  WBC 4.0 - 10.5 K/uL 6.8  7.5  8.0   Hemoglobin 12.0 - 15.0 g/dL 32.9   51.8  84.1   Hematocrit 36.0 - 46.0 % 39.2  37.7  39.3   Platelets 150.0 - 400.0 K/uL 275.0  253  243.0    Lab Results  Component Value Date   HGBA1C 5.6 06/22/2022    Lab Results  Component Value Date   TSH 3.58 08/18/2022    ROS  ***ROS  Physical Exam:   VS:  There were no vitals taken for this visit.   Wt Readings from Last 3 Encounters:  07/28/23 143 lb (64.9 kg)  07/13/23 145 lb 2 oz (65.8 kg)  04/26/23 147 lb 8 oz (66.9 kg)    ***Physical Exam Studies Reviewed: Aaron Aas    Coronary CT angiogram 2012 Coronary calcium  score 95.5 in the 54th percentile.  Ascending and descending aorta are normal. Proximal LAD >50% stenosis. LCx nondominant. RCA large, normal limits. EKG:         Medications and allergies    Allergies  Allergen Reactions   Salmon [Fish Allergy] Anaphylaxis    This happens with "CANNED SALMON" only, if eaten uncooked   Ampicillin Nausea  Only    Has patient had a PCN reaction causing immediate rash, facial/tongue/throat swelling, SOB or lightheadedness with hypotension: No Has patient had a PCN reaction causing severe rash involving mucus membranes or skin necrosis: No Has patient had a PCN reaction that required hospitalization: No Has patient had a PCN reaction occurring within the last 10 years: No If all of the above answers are "NO", then may proceed with Cephalosporin use.    Codeine Nausea And Vomiting   Flagyl [Metronidazole] Nausea And Vomiting    Patient was taking this along with Neomycin and could not differentiate which med triggered the nausea & vomiting   Meperidine Hcl Nausea And Vomiting   Neomycin Nausea And Vomiting    Patient was taking this along with Flagyl and could not differentiate which med triggered the nausea & vomiting   Propoxyphene Hcl Nausea And Vomiting   Sulfa Antibiotics Hives   Tramadol  Hcl Nausea And Vomiting     Current Outpatient Medications:    azaTHIOprine (IMURAN) 50 MG tablet, Take 1 tablet by mouth  daily., Disp: , Rfl:    brimonidine -timolol  (COMBIGAN ) 0.2-0.5 % ophthalmic solution, Apply 1 drop to eye 2 (two) times daily., Disp: , Rfl:    Calcium  Carb-Cholecalciferol  (CALCIUM  600 + D PO), Take 1 capsule by mouth in the morning and at bedtime., Disp: , Rfl:    LUMIGAN 0.01 % SOLN, Place 1 drop into both eyes at bedtime., Disp: , Rfl:    Multiple Vitamin (MULTIVITAMIN WITH MINERALS) TABS tablet, Take 1 tablet by mouth daily. , Disp: , Rfl:    mycophenolate (CELLCEPT) 250 MG capsule, Take 250 mg by mouth 2 (two) times daily., Disp: , Rfl:    POTASSIUM PO, Take 1 capsule by mouth daily., Disp: , Rfl:    rosuvastatin  (CRESTOR ) 20 MG tablet, TAKE 1 TABLET BY MOUTH EVERY DAY, Disp: 90 tablet, Rfl: 1   solifenacin  (VESICARE ) 5 MG tablet, TAKE 1 TABLET (5 MG TOTAL) BY MOUTH DAILY., Disp: 90 tablet, Rfl: 0   valACYclovir  (VALTREX ) 500 MG tablet, Take 1 tablet (500 mg total) by mouth 2 (two) times daily. For a flare of herpes, Disp: 6 tablet, Rfl: 1   No orders of the defined types were placed in this encounter.    There are no discontinued medications.   ASSESSMENT AND PLAN: .      ICD-10-CM   1. Coronary artery calcification seen on CAT scan  I25.10       Assessment and Plan Assessment & Plan      Signed,  Knox Perl, MD, [facility] 08/02/2023, 11:12 AM [facility] [address] [place], [place] [address] Phone: [phone]. Fax:  [phone]

## 2023-08-03 ENCOUNTER — Encounter: Payer: Self-pay | Admitting: Cardiology

## 2023-08-03 NOTE — Progress Notes (Signed)
 CHIEF COMPLAINT Chief Complaint  Patient presents with  . Follow Up Exam      HISTORY OF PRESENT ILLNESS: Yvonne  [name] is a 83 y.o. female who presents to the clinic today for 10 week follow up for peripheral focal chorioretinal inflammation of both eyes. Patient has hx of pseudophakia both eyes, epiretinal membrane of both eyes. Currently on Cellcept 250 mg BID.   Today the patient reports she stopped taking her Cellcept for 10 days after being prescribed doxycyline for a tic outbreak.   She denies ocular pain, flashes, floaters and other concerns at this time.  Denies any fevers, weight loss, night sweats.   Review of Systems:   Constitutional symptoms: negative Eyes:  negative Ear, nose, throat:  negative Cardiovascular:  negative Respiratory:  negative Gastrointestinal:  negative Genitourinary:  negative Skin:  negative Neurological:  negative Musculoskeletal:  negative Psychiatric:  negative Endocrine:  negative Hematological:  negative Allergic:  negative   Selected notes from the MEDICAL RECORD NUMBER     CURRENT DROPS: See med list  Referring physician: No referring provider defined for this encounter.  REVIEW OF SYSTEMS: See tech note  MEDICATIONS Current Outpatient Medications  Medication Sig Dispense Refill  . brimonidine -timoloL  (COMBIGAN ) 0.2-0.5 % ophthalmic solution Administer 1 drop into each eyes in the morning and 1 drop at noon.    . Lumigan 0.01 % ophthalmic drops Administer 1 drop into each eyes at bedtime.    . rosuvastatin  (CRESTOR ) 20 mg tablet Take 1 tablet by mouth daily.    . valACYclovir  (VALTREX ) 500 mg tablet Take 500 mg by mouth 2 (two) times a day. (Patient taking differently: Take 500 mg by mouth 2 (two) times a day. Takes as needed.)    . adalimumab (Humira,CF, Pen) 40 mg/0.4 mL pnkt injection Inject 80mg  SQ on Day 1, 40mg  on Day 8, then 40mg  every other week Quantity 4 Maintenance: Inject 40mg  SQ every other week Quantity 11  2.4 mL 11  . cycloSPORINE (RESTASIS) 0.05 % ophthalmic emulsion Administer 1 drop into each eyes in the morning and 1 drop at noon. (Patient not taking: Reported on 08/09/2023)    . mycophenolate (CellCept) 250 mg capsule Take 2 capsules (500 mg total) by mouth 2 (two) times a day. 360 capsule 3   No current facility-administered medications for this visit.     ALLERGIES Allergies  Allergen Reactions  . Fish Containing Products Anaphylaxis    This happens with CANNED SALMON only, if eaten uncooked  . Codeine Nausea And Vomiting  . Meperidine Hcl Nausea And Vomiting  . Metronidazole Nausea And Vomiting    Patient was taking this along with Neomycin and could not differentiate which med triggered the nausea & vomiting  . Neomycin Nausea And Vomiting    Patient was taking this along with Flagyl and could not differentiate which med triggered the nausea & vomiting  . Propoxyphene Hcl Nausea And Vomiting  . Sulfa (Sulfonamide Antibiotics) Hives  . Tramadol  Hcl Nausea And Vomiting    PAST MEDICAL HISTORY Past Surgical History:  Procedure Laterality Date  . CATARACT EXTRACTION Bilateral     FAMILY HISTORY Family History  Problem Relation Name Age of Onset  . No Known Problems Mother    . No Known Problems Father       SOCIAL HISTORY Social History   Socioeconomic History  . Marital status: Single    Spouse name: Not on file  . Number of children: Not on file  . Years of  education: Not on file  . Highest education level: Not on file  Occupational History  . Not on file  Tobacco Use  . Smoking status: Never    Passive exposure: Never  . Smokeless tobacco: Never  Vaping Use  . Vaping status: Never Used  Substance and Sexual Activity  . Alcohol use: Not on file  . Drug use: Not on file  . Sexual activity: Not on file  Other Topics Concern  . Not on file  Social History Narrative  . Not on file   Social Drivers of Health   Food Insecurity: No Food Insecurity  (05/24/2022)   Received from [facility]   Food vital sign   . Within the past 12 months, you worried that your food would run out before you got money to buy more: Never true   . Within the past 12 months, the food you bought just didn't last and you didn't have money to get more: Never true  Transportation Needs: No Transportation Needs (05/24/2022)   Received from [facility] - Transportation   . Lack of Transportation (Medical): No   . Lack of Transportation (Non-Medical): No  Safety: Not At Risk (03/27/2021)   Received from [facility]   Safety   . Within the last year, have you been afraid of your partner or ex-partner?: No   . Within the last year, have you been humiliated or emotionally abused in other ways by your partner or ex-partner?: No   . Within the last year, have you been kicked, hit, slapped, or otherwise physically hurt by your partner or ex-partner?: No   . Within the last year, have you been raped or forced to have any kind of sexual activity by your partner or ex-partner?: No  Living Situation: Not on file       OPHTHALMIC EXAM: Base Eye Exam     Visual Acuity (Snellen - Linear (inverted))       Right Left   Dist [place] 20/250 20/70 -2         Tonometry (Tonopen: Tetracaine OU, 11:37 AM)       Right Left   Pressure 11 14         Pupils       APD   Right None   Left None         Visual Fields       Left Right     Full   Restrictions Partial outer superior nasal, inferior nasal deficiencies          Extraocular Movement       Right Left    Full Full         Neuro/Psych     Oriented x3: Yes   Mood/Affect: Normal         Dilation     Both eyes: 2.5% Phenylephrine , 1.0% Tropicamide @ 11:37 AM           Slit Lamp and Fundus Exam     External Exam       Right Left   External Normal Normal         Slit Lamp Exam       Right Left   Lids/Lashes Normal Normal   Conjunctiva/Sclera 2+ injection 2 + injection    Cornea mutton fat kp mutton fat kp   Anterior Chamber Deep and quiet 1+ cell   Iris Round and reactive Round and reactive   Lens PCIOL, 3+ PCO PCIOL  Anterior Vitreous Normal Normal         Fundus Exam       Right Left   Disc Normal Normal   Macula Normal Normal   Vessels Normal Normal   Periphery Normal Normal            LABS No visits with results within 4 Week(s) from this visit.  Latest known visit with results is:  Lab on 04/12/2023  Component Date Value  . Treponema pallidum Antib* 04/12/2023 Non-Reactive   . ACE 04/12/2023 19   . Antimyeloperoxidase (MPO* 04/12/2023 <0.2   . Antiproteinase 3 (Pr-3) * 04/12/2023 <0.2   . C-ANCA 04/12/2023 <1:20   . P-ANCA 04/12/2023 <1:20   . pANCA 04/12/2023 <1:20   . Lyme Total Antibody EIA 04/12/2023 Negative   . Scl 70 Antibodies IgG 04/12/2023 <0.2   . Antichromatin Antibodies 04/12/2023 0.3   . Anti-Jo-1 04/12/2023 <0.2   . Smith Antibodies IgG 04/12/2023 <0.2   . RNP Antibodies IgG 04/12/2023 0.6   . Anti-Centromere B Antibo* 04/12/2023 <0.2   . Antiribosomal P Antibodi* 04/12/2023 <0.2   . Complement C1q, Quantita* 04/12/2023 17.6   . Total Protein 04/12/2023 6.2 (L)   . Electrophoresis, Albumin 04/12/2023 3.8   . Alpha-1-Globulin 04/12/2023 0.2   . Alpha-2-Globulin 04/12/2023 0.6   . Beta-1-Globulin 04/12/2023 0.4   . Beta-2-Globulin 04/12/2023 0.3   . Gamma Globulin 04/12/2023 0.8   . Protein Electrophoresis * 04/12/2023 NO M SPIKE SEEN.   . Pathology Review Serum E* 04/12/2023 Electronically signed by [employer]    . Locus A 1 04/12/2023 A*02:01   . Locus A 2 04/12/2023 A*24:02   . Locus B 1 04/12/2023 B*40:01   . Locus B 2 04/12/2023 B*51:08   . Locus C 1 04/12/2023 C*03:10   . Locus C 2 04/12/2023 C*16:02   . Locus DRB1 1 04/12/2023 DRB1*13:01P   . Locus DRB1 2 04/12/2023 DRB1*13:03   . Locus DRB345 1 04/12/2023 DRB3*01:01P   . Locus DRB345 2 04/12/2023 DRB3*01:01P   . Locus DQA1 1  04/12/2023 DQA1*01:03   . Locus DQA1 2 04/12/2023 DQA1*05:05   . Locus DQB1 1 04/12/2023 DQB1*03:01   . Locus DQB1 2 04/12/2023 DQB1*06:03   . Locus DPA1  1 04/12/2023 DPA1*01:03   . Locus DPA1 2 04/12/2023 DPA1*01:03   . Locus DPB1 1 04/12/2023 DPB1*20:01   . Locus DPB1 2 04/12/2023 DPB1*104:01   . Report comment 04/12/2023 Patient is negative for HLA-A29.  <MG>  Patient is negative for HLA-B27.  <MG>  Patient is POSITIVE for HLA-B51.  <MG>  Patient is negative for HLA-DR4.  <MG>   . Typing Test Date 04/12/2023 2023-04-15   . Sample Draw date 04/12/2023 2023-04-12   . Disclaimer (FDA) 04/12/2023                     Value:HLA typing is performed by intermediate to high resolution SSP or NGS.   Crossmatches are performed by flow cytometry.  All procedures and reagents have been validated and performance characteristics determined by the HLA/Immunogenetics Laboratory.   Certain of these tests have not been cleared/approved by the U.S. Food and Drug Administration.  The FDA has determined that such approval is not necessary because this laboratory is certified under the Clinical Laboratory Improvement Amendments to  perform high complexity testing.  Additional information may be obtained by contacting the laboratory.  . Disclaimer (CLIA and Lab* 04/12/2023  Value:THIS LABORATORY IS CERTIFIED UNDER THE CLINICAL LABORATORY IMPROVEMENT AMENDMENTS OF 1988 (CLIA) AS QUALIFIED TO PERFORM HIGH COMPLEXITY CLINICAL TESTING. Reviewed and approved by: Michael D. Lamonte, Ph.D., F.ACHI Director, HLA/Immunogenetics and  Immunodiagnostics Laboratory.  . CCP Antibodies, IgG & IgA 04/12/2023 5   . Lysozyme 04/12/2023 4.6   . Rheumatoid Factor 04/12/2023 11.4   . TPMT Activity 04/12/2023 17.9   . Interpretation: (TPMT) 04/12/2023 Comment   . Methodology (TPMT) 04/12/2023 Comment   . IL-2 Receptor Alpha 04/12/2023 478   . Quantiferon Incubation 04/12/2023 Incubation performed.   .  Quantiferon-TB Gold Plus 04/12/2023 Negative   . DSDNA AB, IgG, S 04/12/2023 <10   . Antinuclear Antibody,Hep* 04/12/2023 Positive 1:160 (A)   . Ana Titer 04/12/2023 1:160   . Ana Pattern 04/12/2023 Speckled   . C3d Immune Complexes 04/12/2023 4.7   . WBC 04/12/2023 6.80   . RBC 04/12/2023 3.94 (L)   . Hemoglobin 04/12/2023 12.2 (L)   . Hematocrit 04/12/2023 35.5 (L)   . Mean Corpuscular Volume * 04/12/2023 90.3   . Mean Corpuscular Hemoglo* 04/12/2023 31.0   . Mean Corpuscular Hemoglo* 04/12/2023 34.4   . Red Cell Distribution Wi* 04/12/2023 14.0   . Platelet Count (PLT) 04/12/2023 222   . Mean Platelet Volume (MP* 04/12/2023 8.8   . Neutrophils % 04/12/2023 55   . Lymphocytes % 04/12/2023 32   . Monocytes % 04/12/2023 10   . Eosinophils % 04/12/2023 2   . Basophils % 04/12/2023 1   . nRBC % 04/12/2023 0   . Neutrophils Absolute 04/12/2023 3.80   . Lymphocytes # 04/12/2023 2.20   . Monocytes # 04/12/2023 0.70   . Eosinophils # 04/12/2023 0.10   . Basophils # 04/12/2023 0.00   . nRBC Absolute 04/12/2023 0.00   . HIV 1/2 Antibody, HIV p2* 04/12/2023 Non-Reactive   . Hepatitis B Surface Anti* 04/12/2023 Non-Reactive   . Hepatitis B Core Antibod* 04/12/2023 Non-Reactive   . Hepatitis A Antibody, IgM 04/12/2023 Non-Reactive   . Hepatitis C Virus Antibo* 04/12/2023 Non-Reactive   . Acute Hepatitis Interpre* 04/12/2023 A negative result does not exclude the possibility of exposure to hepatitis virus. Antibody level during early infection stage may be below the limit of detection of the assay.   SABRA Bending Criteria 04/12/2023 Comment   . Quantiferon Nil Value 04/12/2023 0.11   . Quantiferon Mitogen Value 04/12/2023 >10.00   . Quantiferon TB1 Ag Value 04/12/2023 0.09   . Quantiferon TB2 Ag Value 04/12/2023 0.09    ANA positive  HLA typing  HLA-A29: negative HLA-B27: negative HLA-B51: positive HLA-DR4: negative     IMAGING   Imaging  Testing report: Optical coherence  tomography Date obtained - 08/09/2023 Indication for imaging: retinal edema  Technically adequate study. Patient compliance high.   Right Eye:  Central foveal thickness: limited view Findings: ERM, NFD,  Comparison to previous: n/a  Left Eye:  Central foveal thickness: 266 Findings: ERM, NFD Comparison to previous: stable   Diagnosis / Impression: see below   Clinical management:  See below   Abbreviations: NFP--Normal foveal profile. Normal OCT. CME--cystoid macular edema. PED--pigment epithelial detachment. SRF--Subretinal fluid.  EZ --ellipsoid zone. ERM Epiretinal membrane.. ORT - outer retinal tubulation. SRHM - subretinal hyper-reflective material     ASSESSMENT/PLAN:   1. Peripheral focal chorioretinal inflammation of both eyes  Comprehensive Metabolic Panel   CBC with Differential   Hemoglobin A1C With Estimated Average Glucose   adalimumab (Humira,CF, Pen) 40 mg/0.4 mL pnkt injection  2. Retinal edema  OCT, Macula - OU - Both Eyes    3. Pseudophakia of both eyes      4. Epiretinal membrane (ERM) of both eyes      5. High risk medication use  Comprehensive Metabolic Panel   CBC with Differential   Hemoglobin A1C With Estimated Average Glucose   adalimumab (Humira,CF, Pen) 40 mg/0.4 mL pnkt injection     1. Peripheral focal chorioretinal inflammation of both eyes - I have discussed the vision threatening nature/potentially blinding disease which requires chemotherapy/immune suppression. I have discussed in great detail the medication along with reviewing all the systemic implications.  -I have reviewed that systemic immune suppression requires monitoring with high risk labs as there is always the potential for changes in hepatic and hematologic or renal parameters that can be life threatening.  - I have discussed she has active inflammation on exam today that requires treatment. I have discussed rationale for starting systemic steroid-sparing therapy. I  recommend starting Azathioprine. I have discussed side effects and patient consents to proceed on 04/12/23.  Imuran Immunosuppression side effects: Liver failure/ toxicity, neuropathy, mild renal impairment, bone marrow suppression, secondary infections, and lymphoproliferative disorders.  - High risk labs drawn on 04/12/2023 - ANA positive, HLA-B51 positive - Due to her active inflammation and intolerance of Azathioprine, I recommend starting a low dosage of Cellcept. I have discussed side effects and patient consents to proceed on 05/31/23.  - D/C Azathioprine 50 mg daily secondary to nausea  (started on 04/12/23) - Patient shows signs of active inflammation on ocular exam today 08/09/23 - Given her active inflammation, I recommend starting Humira 40 mg SQ q2weeks with her Cellcept. I have discussed side effects and wishes to contemplate upon approval today 08/09/23.   - Increase Cellcept 250 mg BID (started on 05/31/23) to 500 mg BID today 08/09/23 - Currently on Brim/tim BID OU and lumigan qhs OU  - Follow up in 10-12 weeks for reevaluation   2. Retinal edema - No retinal edema noted on imaging or exam - Monitor   3. Pseudophakia of both eyes - Monitor  4. Epiretinal membrane (ERM) of both eyes  - I have discussed the nature and mechanism. I have discussed that the superficial scar tissue is affecting her vision. I will recommend observation at this time.   5. High risk medication use - I have assessed no occult side-effects to medication   - I will draw high risk labs today 08/09/2023       =====  Paticia Fairly, MD  Diseases and Surgery of the Retina and Vitreous and  Uveitis and Ocular Immunology Assistant Professor of Ophthalmology Department of Ophthalmology  [facility]   Requested Prescriptions   Signed Prescriptions Disp Refills  . mycophenolate (CellCept) 250 mg capsule 360 capsule 3    Sig: Take 2 capsules (500 mg total) by mouth 2 (two) times  a day.  SABRA adalimumab (Humira,CF, Pen) 40 mg/0.4 mL pnkt injection 2.4 mL 11    Sig: Inject 80mg  SQ on Day 1, 40mg  on Day 8, then 40mg  every other week Quantity 4 Maintenance: Inject 40mg  SQ every other week Quantity 11        No follow-ups on file.     Abbreviations DME diabetic macular edema; PDR proliferative diabetic retinopathy; NPDR non proliferative diabetic retinopathy;  BRVO  Branch retinal vein occlusion; CRVO central retinal vein occlusion; ; RD retinal detachment; RT retinal tear; SB scleral buckle; PPV pars  plana vitrectomy; VH  Vitreous hemorrhage; PRP  panretinal laser photocoagulation; IVK intravitreal kenalog ; IVA intravitreal avastin; IVL intravitreal Lucentis; IVE Intravitreal Eylea; PVD posterior vitreous detachment; OD right eye; OS left eye; OU  Both eyes;  VMT  vitreomacular traction; MH  Macular hole; ERM epiretinal membrane; NVD neovascularization of the disc; NVE neovascularization elsewhere; AREDS age related eye disease study  This document serves as a record of services personally performed by Paticia Fairly, MD. It was created on their behalf by Janita Sieving, a trained medical scribe. The creation of this record is the provider's dictation and/or activities during the visit.  I agree the documentation is accurate and complete.  Electronically signed by: Paticia FORBES Fairly MD 08/09/2023 12:37 PM

## 2023-08-09 DIAGNOSIS — Z961 Presence of intraocular lens: Secondary | ICD-10-CM | POA: Diagnosis not present

## 2023-08-09 DIAGNOSIS — H35373 Puckering of macula, bilateral: Secondary | ICD-10-CM | POA: Diagnosis not present

## 2023-08-09 DIAGNOSIS — Z79899 Other long term (current) drug therapy: Secondary | ICD-10-CM | POA: Diagnosis not present

## 2023-08-09 DIAGNOSIS — H30033 Focal chorioretinal inflammation, peripheral, bilateral: Secondary | ICD-10-CM | POA: Diagnosis not present

## 2023-09-07 DIAGNOSIS — Z961 Presence of intraocular lens: Secondary | ICD-10-CM | POA: Diagnosis not present

## 2023-09-07 DIAGNOSIS — H26493 Other secondary cataract, bilateral: Secondary | ICD-10-CM | POA: Diagnosis not present

## 2023-09-07 DIAGNOSIS — H401133 Primary open-angle glaucoma, bilateral, severe stage: Secondary | ICD-10-CM | POA: Diagnosis not present

## 2023-09-07 DIAGNOSIS — H2013 Chronic iridocyclitis, bilateral: Secondary | ICD-10-CM | POA: Diagnosis not present

## 2023-10-07 DIAGNOSIS — H26493 Other secondary cataract, bilateral: Secondary | ICD-10-CM | POA: Diagnosis not present

## 2023-10-07 DIAGNOSIS — H2013 Chronic iridocyclitis, bilateral: Secondary | ICD-10-CM | POA: Diagnosis not present

## 2023-10-07 DIAGNOSIS — Z961 Presence of intraocular lens: Secondary | ICD-10-CM | POA: Diagnosis not present

## 2023-10-07 DIAGNOSIS — H401133 Primary open-angle glaucoma, bilateral, severe stage: Secondary | ICD-10-CM | POA: Diagnosis not present

## 2023-10-11 DIAGNOSIS — H30033 Focal chorioretinal inflammation, peripheral, bilateral: Secondary | ICD-10-CM | POA: Diagnosis not present

## 2023-10-11 DIAGNOSIS — H35373 Puckering of macula, bilateral: Secondary | ICD-10-CM | POA: Diagnosis not present

## 2023-10-11 DIAGNOSIS — Z961 Presence of intraocular lens: Secondary | ICD-10-CM | POA: Diagnosis not present

## 2023-10-11 DIAGNOSIS — Z79899 Other long term (current) drug therapy: Secondary | ICD-10-CM | POA: Diagnosis not present

## 2023-10-11 DIAGNOSIS — H3581 Retinal edema: Secondary | ICD-10-CM | POA: Diagnosis not present

## 2023-11-04 DIAGNOSIS — H401133 Primary open-angle glaucoma, bilateral, severe stage: Secondary | ICD-10-CM | POA: Diagnosis not present

## 2023-11-24 ENCOUNTER — Other Ambulatory Visit: Payer: Self-pay | Admitting: Family Medicine

## 2024-01-25 DIAGNOSIS — H2013 Chronic iridocyclitis, bilateral: Secondary | ICD-10-CM | POA: Diagnosis not present

## 2024-01-25 DIAGNOSIS — H209 Unspecified iridocyclitis: Secondary | ICD-10-CM | POA: Diagnosis not present

## 2024-01-25 DIAGNOSIS — H401123 Primary open-angle glaucoma, left eye, severe stage: Secondary | ICD-10-CM | POA: Diagnosis not present

## 2024-01-25 DIAGNOSIS — H26493 Other secondary cataract, bilateral: Secondary | ICD-10-CM | POA: Diagnosis not present

## 2024-03-27 ENCOUNTER — Ambulatory Visit: Payer: Self-pay

## 2024-03-27 ENCOUNTER — Other Ambulatory Visit: Payer: Self-pay | Admitting: Family Medicine

## 2024-03-27 MED ORDER — VALACYCLOVIR HCL 500 MG PO TABS: 500.0000 mg | ORAL_TABLET | Freq: Two times a day (BID) | ORAL | 1 refills | Status: AC

## 2024-03-27 NOTE — Telephone Encounter (Signed)
 Pt has longstanding history of herpes and reports a new herpes spot on her lower back, is requesting Valtrex  to be called in. 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 Please call the pt back and advise. She states her roommate can pick up the Rx.   FYI Only or Action Required?: Action required by provider: clinical question for provider.  Patient was last seen in primary care on 07/28/2023 by Vincente Shivers, NP.  Called Nurse Triage reporting Rash.  Symptoms began several weeks ago.  Interventions attempted: Prescription medications: betadine spray.  Symptoms are: stable.  Triage Disposition: Home Care  Patient/caregiver understands and will follow disposition?: Yes      Reason for Triage: dxs with herpes, has a outbreak around eyes and can't see due to allergic reaction to medicine. Hard time seeing/vision blurry.  Reason for Disposition  Mild localized rash  Answer Assessment - Initial Assessment Questions 1. APPEARANCE of RASH: What does the rash look like? (e.g., blisters, dry flaky skin, red spots, redness, sores)     I can feel it. It's about a 1/4 in. Got little bumps on it. Has shrunk down a tiny bit. I know what herpes looks like, and it is a herpes.   2. LOCATION: Where is the rash located?      Spot on my back   5. ONSET: When did the rash start?      A few weeks  6. ITCHING: Does the rash itch? If Yes, ask: How bad is the itch?  (Scale 0-10; or none, mild, moderate, severe)     Denies  7. PAIN: Does the rash hurt? If Yes, ask: How bad is the pain?  (Scale 0-10; or none, mild, moderate, severe)     Denies  Has had Valtrex  in the past, is requesting it to be called in  Protocols used: Rash or Redness - Localized-A-AH

## 2024-03-27 NOTE — Telephone Encounter (Signed)
 Rx was sent but called # left in message (only one on file) and phone # was off (stated: call couldn't be completed as dialed).   If pt calls back **e2c2: please advise pt of PCP's comments if pt calls back

## 2024-03-27 NOTE — Telephone Encounter (Signed)
 I sent it  Follow up if no improvement or worse
